# Patient Record
Sex: Male | Born: 1945
Health system: Southern US, Community
[De-identification: ages and names within clinical notes are randomized; demographics above are authoritative.]

## PROBLEM LIST (undated history)

## (undated) DIAGNOSIS — K219 Gastro-esophageal reflux disease without esophagitis: Secondary | ICD-10-CM

## (undated) DIAGNOSIS — N183 Chronic kidney disease, stage 3 unspecified: Secondary | ICD-10-CM

## (undated) DIAGNOSIS — Z950 Presence of cardiac pacemaker: Secondary | ICD-10-CM

## (undated) DIAGNOSIS — E1121 Type 2 diabetes mellitus with diabetic nephropathy: Secondary | ICD-10-CM

## (undated) DIAGNOSIS — G4730 Sleep apnea, unspecified: Secondary | ICD-10-CM

## (undated) DIAGNOSIS — D649 Anemia, unspecified: Secondary | ICD-10-CM

## (undated) DIAGNOSIS — E785 Hyperlipidemia, unspecified: Secondary | ICD-10-CM

## (undated) DIAGNOSIS — R0602 Shortness of breath: Secondary | ICD-10-CM

## (undated) DIAGNOSIS — I509 Heart failure, unspecified: Secondary | ICD-10-CM

## (undated) DIAGNOSIS — M1990 Unspecified osteoarthritis, unspecified site: Secondary | ICD-10-CM

## (undated) DIAGNOSIS — Z95810 Presence of automatic (implantable) cardiac defibrillator: Secondary | ICD-10-CM

## (undated) DIAGNOSIS — I2510 Atherosclerotic heart disease of native coronary artery without angina pectoris: Secondary | ICD-10-CM

## (undated) DIAGNOSIS — I10 Essential (primary) hypertension: Secondary | ICD-10-CM

## (undated) DIAGNOSIS — I519 Heart disease, unspecified: Secondary | ICD-10-CM

## (undated) DIAGNOSIS — G709 Myoneural disorder, unspecified: Secondary | ICD-10-CM

## (undated) DIAGNOSIS — K5790 Diverticulosis of intestine, part unspecified, without perforation or abscess without bleeding: Secondary | ICD-10-CM

## (undated) HISTORY — PX: CYST EXCISION: SHX5701

## (undated) HISTORY — PX: INSERT / REPLACE / REMOVE PACEMAKER: SUR710

## (undated) HISTORY — DX: Hyperlipidemia, unspecified: E78.5

## (undated) HISTORY — DX: Heart disease, unspecified: I51.9

## (undated) HISTORY — PX: CARPAL TUNNEL RELEASE: SHX101

## (undated) HISTORY — PX: SHOULDER OPEN ROTATOR CUFF REPAIR: SHX2407

## (undated) HISTORY — DX: Type 2 diabetes mellitus with diabetic nephropathy: E11.21

## (undated) HISTORY — PX: ANTERIOR CERVICAL DECOMP/DISCECTOMY FUSION: SHX1161

## (undated) HISTORY — DX: Essential (primary) hypertension: I10

## (undated) HISTORY — DX: Atherosclerotic heart disease of native coronary artery without angina pectoris: I25.10

## (undated) HISTORY — DX: Chronic kidney disease, stage 3 unspecified: N18.30

## (undated) HISTORY — DX: Chronic kidney disease, stage 3 (moderate): N18.3

## (undated) HISTORY — DX: Diverticulosis of intestine, part unspecified, without perforation or abscess without bleeding: K57.90

## (undated) HISTORY — PX: JOINT REPLACEMENT: SHX530

## (undated) HISTORY — PX: BACK SURGERY: SHX140

---

## 2000-02-28 ENCOUNTER — Encounter: Payer: Self-pay | Admitting: Family Medicine

## 2000-02-28 ENCOUNTER — Encounter: Admission: RE | Admit: 2000-02-28 | Discharge: 2000-02-28 | Payer: Self-pay | Admitting: Family Medicine

## 2000-03-26 ENCOUNTER — Encounter: Admission: RE | Admit: 2000-03-26 | Discharge: 2000-03-26 | Payer: Self-pay | Admitting: Neurosurgery

## 2000-03-26 ENCOUNTER — Encounter: Payer: Self-pay | Admitting: Neurosurgery

## 2000-07-02 ENCOUNTER — Encounter: Payer: Self-pay | Admitting: Neurosurgery

## 2000-07-04 ENCOUNTER — Inpatient Hospital Stay (HOSPITAL_COMMUNITY): Admission: RE | Admit: 2000-07-04 | Discharge: 2000-07-08 | Payer: Self-pay | Admitting: Neurosurgery

## 2000-07-04 ENCOUNTER — Encounter: Payer: Self-pay | Admitting: Neurosurgery

## 2000-07-25 ENCOUNTER — Encounter: Payer: Self-pay | Admitting: Neurosurgery

## 2000-07-25 ENCOUNTER — Encounter: Admission: RE | Admit: 2000-07-25 | Discharge: 2000-07-25 | Payer: Self-pay | Admitting: Neurosurgery

## 2000-08-14 ENCOUNTER — Ambulatory Visit (HOSPITAL_COMMUNITY): Admission: RE | Admit: 2000-08-14 | Discharge: 2000-08-14 | Payer: Self-pay | Admitting: Neurosurgery

## 2003-01-07 ENCOUNTER — Ambulatory Visit (HOSPITAL_COMMUNITY): Admission: RE | Admit: 2003-01-07 | Discharge: 2003-01-07 | Payer: Self-pay | Admitting: Cardiology

## 2004-07-05 ENCOUNTER — Ambulatory Visit: Payer: Self-pay | Admitting: Cardiology

## 2004-07-14 ENCOUNTER — Emergency Department (HOSPITAL_COMMUNITY): Admission: EM | Admit: 2004-07-14 | Discharge: 2004-07-14 | Payer: Self-pay | Admitting: Family Medicine

## 2004-08-09 ENCOUNTER — Ambulatory Visit: Payer: Self-pay | Admitting: Cardiology

## 2004-08-10 ENCOUNTER — Ambulatory Visit: Payer: Self-pay | Admitting: Cardiology

## 2004-08-24 ENCOUNTER — Ambulatory Visit: Payer: Self-pay

## 2004-08-31 ENCOUNTER — Ambulatory Visit: Payer: Self-pay | Admitting: Internal Medicine

## 2004-09-14 ENCOUNTER — Encounter: Admission: RE | Admit: 2004-09-14 | Discharge: 2004-12-13 | Payer: Self-pay | Admitting: Internal Medicine

## 2007-03-20 ENCOUNTER — Encounter: Payer: Self-pay | Admitting: Internal Medicine

## 2007-03-20 ENCOUNTER — Ambulatory Visit: Payer: Self-pay | Admitting: Internal Medicine

## 2007-03-21 ENCOUNTER — Encounter (INDEPENDENT_AMBULATORY_CARE_PROVIDER_SITE_OTHER): Payer: Self-pay | Admitting: Internal Medicine

## 2007-03-21 ENCOUNTER — Ambulatory Visit: Payer: Self-pay | Admitting: *Deleted

## 2007-03-21 LAB — CONVERTED CEMR LAB
ALT: 18 units/L (ref 0–53)
AST: 16 units/L (ref 0–37)
Alkaline Phosphatase: 56 units/L (ref 39–117)
Chloride: 101 meq/L (ref 96–112)
Cholesterol: 286 mg/dL — ABNORMAL HIGH (ref 0–200)
Creatinine, Ser: 1.05 mg/dL (ref 0.40–1.50)
Total Bilirubin: 0.4 mg/dL (ref 0.3–1.2)
Triglycerides: 949 mg/dL — ABNORMAL HIGH (ref <150)

## 2007-04-01 ENCOUNTER — Encounter: Admission: RE | Admit: 2007-04-01 | Discharge: 2007-05-06 | Payer: Self-pay | Admitting: Family Medicine

## 2007-04-04 ENCOUNTER — Ambulatory Visit: Payer: Self-pay | Admitting: Internal Medicine

## 2007-05-16 ENCOUNTER — Ambulatory Visit: Payer: Self-pay | Admitting: Internal Medicine

## 2007-06-06 ENCOUNTER — Ambulatory Visit: Payer: Self-pay | Admitting: Internal Medicine

## 2007-06-30 ENCOUNTER — Ambulatory Visit: Payer: Self-pay | Admitting: Internal Medicine

## 2007-08-12 ENCOUNTER — Ambulatory Visit: Payer: Self-pay | Admitting: Internal Medicine

## 2007-10-22 ENCOUNTER — Ambulatory Visit: Payer: Self-pay | Admitting: Internal Medicine

## 2007-11-20 ENCOUNTER — Encounter: Admission: RE | Admit: 2007-11-20 | Discharge: 2007-11-20 | Payer: Self-pay | Admitting: Orthopedic Surgery

## 2007-12-29 ENCOUNTER — Ambulatory Visit (HOSPITAL_COMMUNITY): Admission: RE | Admit: 2007-12-29 | Discharge: 2007-12-29 | Payer: Self-pay | Admitting: Orthopaedic Surgery

## 2008-01-16 ENCOUNTER — Ambulatory Visit (HOSPITAL_COMMUNITY): Admission: RE | Admit: 2008-01-16 | Discharge: 2008-01-17 | Payer: Self-pay | Admitting: Orthopaedic Surgery

## 2008-01-16 ENCOUNTER — Ambulatory Visit: Payer: Self-pay | Admitting: Internal Medicine

## 2008-01-28 ENCOUNTER — Ambulatory Visit: Payer: Self-pay | Admitting: Internal Medicine

## 2008-02-03 ENCOUNTER — Ambulatory Visit: Payer: Self-pay

## 2008-02-03 ENCOUNTER — Encounter: Payer: Self-pay | Admitting: Cardiology

## 2008-02-03 LAB — CONVERTED CEMR LAB
AST: 21 units/L (ref 0–37)
Albumin: 4.2 g/dL (ref 3.5–5.2)
Alkaline Phosphatase: 45 units/L (ref 39–117)
Cholesterol: 181 mg/dL (ref 0–200)
Total Bilirubin: 0.8 mg/dL (ref 0.3–1.2)
Total CHOL/HDL Ratio: 5.9
Total Protein: 7.4 g/dL (ref 6.0–8.3)
Triglycerides: 229 mg/dL (ref 0–149)
VLDL: 46 mg/dL — ABNORMAL HIGH (ref 0–40)

## 2008-02-09 ENCOUNTER — Ambulatory Visit: Payer: Self-pay

## 2008-02-09 ENCOUNTER — Encounter: Payer: Self-pay | Admitting: Internal Medicine

## 2008-02-12 ENCOUNTER — Encounter: Admission: RE | Admit: 2008-02-12 | Discharge: 2008-04-19 | Payer: Self-pay | Admitting: Orthopaedic Surgery

## 2008-02-24 ENCOUNTER — Ambulatory Visit: Payer: Self-pay | Admitting: Pulmonary Disease

## 2008-02-24 DIAGNOSIS — I10 Essential (primary) hypertension: Secondary | ICD-10-CM | POA: Insufficient documentation

## 2008-02-24 DIAGNOSIS — E785 Hyperlipidemia, unspecified: Secondary | ICD-10-CM | POA: Insufficient documentation

## 2008-02-24 DIAGNOSIS — I214 Non-ST elevation (NSTEMI) myocardial infarction: Secondary | ICD-10-CM

## 2008-02-24 DIAGNOSIS — G4710 Hypersomnia, unspecified: Secondary | ICD-10-CM | POA: Insufficient documentation

## 2008-03-18 ENCOUNTER — Ambulatory Visit (HOSPITAL_BASED_OUTPATIENT_CLINIC_OR_DEPARTMENT_OTHER): Admission: RE | Admit: 2008-03-18 | Discharge: 2008-03-18 | Payer: Self-pay | Admitting: Pulmonary Disease

## 2008-03-18 ENCOUNTER — Encounter: Payer: Self-pay | Admitting: Pulmonary Disease

## 2008-03-22 ENCOUNTER — Ambulatory Visit: Payer: Self-pay | Admitting: Internal Medicine

## 2008-03-25 ENCOUNTER — Ambulatory Visit: Payer: Self-pay | Admitting: Pulmonary Disease

## 2008-04-09 ENCOUNTER — Ambulatory Visit (HOSPITAL_COMMUNITY): Admission: RE | Admit: 2008-04-09 | Discharge: 2008-04-10 | Payer: Self-pay | Admitting: Orthopaedic Surgery

## 2008-04-09 ENCOUNTER — Ambulatory Visit: Payer: Self-pay | Admitting: Cardiovascular Disease

## 2008-04-19 ENCOUNTER — Encounter: Payer: Self-pay | Admitting: Internal Medicine

## 2008-04-19 ENCOUNTER — Ambulatory Visit: Payer: Self-pay

## 2008-04-30 ENCOUNTER — Ambulatory Visit: Payer: Self-pay | Admitting: Family Medicine

## 2008-05-03 ENCOUNTER — Ambulatory Visit: Payer: Self-pay | Admitting: Internal Medicine

## 2008-05-03 LAB — CONVERTED CEMR LAB
BUN: 17 mg/dL (ref 6–23)
Bilirubin, Direct: 0.1 mg/dL (ref 0.0–0.3)
Calcium: 9.1 mg/dL (ref 8.4–10.5)
Creatinine, Ser: 1.2 mg/dL (ref 0.4–1.5)
HDL: 27.3 mg/dL — ABNORMAL LOW (ref >39.0)
Potassium: 4.7 meq/L (ref 3.5–5.1)
Sodium: 140 meq/L (ref 135–145)
Total Protein: 6.9 g/dL (ref 6.0–8.3)
Triglycerides: 252 mg/dL (ref 0–149)
VLDL: 50 mg/dL — ABNORMAL HIGH (ref 0–40)

## 2008-05-06 ENCOUNTER — Ambulatory Visit: Payer: Self-pay | Admitting: Internal Medicine

## 2008-05-11 ENCOUNTER — Ambulatory Visit: Payer: Self-pay | Admitting: Internal Medicine

## 2008-05-11 LAB — CONVERTED CEMR LAB
BUN: 15 mg/dL (ref 6–23)
Basophils Absolute: 0.1 10*3/uL (ref 0.0–0.1)
Basophils Relative: 1 % (ref 0.0–3.0)
CO2: 29 meq/L (ref 19–32)
Eosinophils Relative: 3.6 % (ref 0.0–5.0)
GFR calc non Af Amer: 80 mL/min
Glucose, Bld: 80 mg/dL (ref 70–99)
HCT: 39.3 % (ref 39.0–52.0)
INR: 1.1 — ABNORMAL HIGH (ref 0.8–1.0)
MCV: 88 fL (ref 78.0–100.0)
Monocytes Absolute: 0.7 10*3/uL (ref 0.1–1.0)
Monocytes Relative: 9.8 % (ref 3.0–12.0)
Neutro Abs: 4.7 10*3/uL (ref 1.4–7.7)
Platelets: 205 10*3/uL (ref 150–400)
Potassium: 4.4 meq/L (ref 3.5–5.1)
Sodium: 139 meq/L (ref 135–145)
WBC: 7.6 10*3/uL (ref 4.5–10.5)

## 2008-05-14 ENCOUNTER — Ambulatory Visit (HOSPITAL_COMMUNITY): Admission: RE | Admit: 2008-05-14 | Discharge: 2008-05-14 | Payer: Self-pay | Admitting: Internal Medicine

## 2008-05-14 ENCOUNTER — Ambulatory Visit: Payer: Self-pay | Admitting: Internal Medicine

## 2008-05-19 ENCOUNTER — Ambulatory Visit: Payer: Self-pay | Admitting: Internal Medicine

## 2008-05-20 ENCOUNTER — Ambulatory Visit (HOSPITAL_COMMUNITY): Admission: RE | Admit: 2008-05-20 | Discharge: 2008-05-21 | Payer: Self-pay | Admitting: Internal Medicine

## 2008-05-20 ENCOUNTER — Ambulatory Visit: Payer: Self-pay | Admitting: Internal Medicine

## 2008-06-07 ENCOUNTER — Ambulatory Visit: Payer: Self-pay

## 2008-07-06 ENCOUNTER — Ambulatory Visit: Payer: Self-pay | Admitting: Internal Medicine

## 2008-07-06 LAB — CONVERTED CEMR LAB
Bilirubin, Direct: 0.1 mg/dL (ref 0.0–0.3)
Calcium: 9.2 mg/dL (ref 8.4–10.5)
Creatinine, Ser: 1.2 mg/dL (ref 0.4–1.5)
GFR calc Af Amer: 79 mL/min
Total CHOL/HDL Ratio: 4.2
Triglycerides: 178 mg/dL — ABNORMAL HIGH (ref 0–149)
VLDL: 36 mg/dL (ref 0–40)

## 2008-07-16 ENCOUNTER — Ambulatory Visit: Payer: Self-pay

## 2008-07-22 ENCOUNTER — Observation Stay (HOSPITAL_COMMUNITY): Admission: EM | Admit: 2008-07-22 | Discharge: 2008-07-24 | Payer: Self-pay | Admitting: Emergency Medicine

## 2008-07-22 ENCOUNTER — Ambulatory Visit: Payer: Self-pay | Admitting: Internal Medicine

## 2008-08-10 ENCOUNTER — Ambulatory Visit: Payer: Self-pay | Admitting: Internal Medicine

## 2008-08-13 ENCOUNTER — Ambulatory Visit: Payer: Self-pay | Admitting: Family Medicine

## 2008-09-01 ENCOUNTER — Encounter: Payer: Self-pay | Admitting: Internal Medicine

## 2008-10-25 ENCOUNTER — Ambulatory Visit: Payer: Self-pay | Admitting: Internal Medicine

## 2008-10-25 ENCOUNTER — Encounter: Payer: Self-pay | Admitting: Family Medicine

## 2008-10-25 LAB — CONVERTED CEMR LAB
AST: 16 units/L (ref 0–37)
Alkaline Phosphatase: 39 units/L (ref 39–117)
BUN: 11 mg/dL (ref 6–23)
Basophils Absolute: 0 10*3/uL (ref 0.0–0.1)
CO2: 23 meq/L (ref 19–32)
Calcium: 9.4 mg/dL (ref 8.4–10.5)
Chloride: 106 meq/L (ref 96–112)
Cholesterol: 148 mg/dL (ref 0–200)
Creatinine, Ser: 1.13 mg/dL (ref 0.40–1.50)
Eosinophils Absolute: 0.1 10*3/uL (ref 0.0–0.7)
Eosinophils Relative: 3 % (ref 0–5)
Glucose, Bld: 122 mg/dL — ABNORMAL HIGH (ref 70–99)
Hemoglobin: 13.6 g/dL (ref 13.0–17.0)
LDL Cholesterol: 66 mg/dL (ref 0–99)
Lymphocytes Relative: 26 % (ref 12–46)
MCHC: 32 g/dL (ref 30.0–36.0)
MCV: 88 fL (ref 78.0–100.0)
Monocytes Relative: 10 % (ref 3–12)
Neutrophils Relative %: 61 % (ref 43–77)
Platelets: 168 10*3/uL (ref 150–400)
Potassium: 4.7 meq/L (ref 3.5–5.3)
Total Bilirubin: 0.5 mg/dL (ref 0.3–1.2)
Total Protein: 7.1 g/dL (ref 6.0–8.3)
Triglycerides: 239 mg/dL — ABNORMAL HIGH (ref <150)
VLDL: 48 mg/dL — ABNORMAL HIGH (ref 0–40)
WBC: 4.9 10*3/uL (ref 4.0–10.5)

## 2008-11-08 ENCOUNTER — Ambulatory Visit: Payer: Self-pay | Admitting: Internal Medicine

## 2008-11-10 ENCOUNTER — Ambulatory Visit: Payer: Self-pay | Admitting: Internal Medicine

## 2008-11-16 ENCOUNTER — Ambulatory Visit: Payer: Self-pay | Admitting: Internal Medicine

## 2008-12-23 ENCOUNTER — Encounter: Payer: Self-pay | Admitting: Internal Medicine

## 2009-02-07 ENCOUNTER — Ambulatory Visit: Payer: Self-pay | Admitting: Internal Medicine

## 2009-02-10 ENCOUNTER — Encounter: Payer: Self-pay | Admitting: Internal Medicine

## 2009-02-21 ENCOUNTER — Ambulatory Visit: Payer: Self-pay | Admitting: Internal Medicine

## 2009-02-21 ENCOUNTER — Encounter: Payer: Self-pay | Admitting: Internal Medicine

## 2009-05-10 ENCOUNTER — Ambulatory Visit: Payer: Self-pay | Admitting: Internal Medicine

## 2009-05-10 DIAGNOSIS — Z95810 Presence of automatic (implantable) cardiac defibrillator: Secondary | ICD-10-CM | POA: Insufficient documentation

## 2009-06-20 ENCOUNTER — Ambulatory Visit: Payer: Self-pay | Admitting: Internal Medicine

## 2009-08-07 ENCOUNTER — Encounter: Payer: Self-pay | Admitting: Internal Medicine

## 2009-08-08 ENCOUNTER — Ambulatory Visit: Payer: Self-pay | Admitting: Internal Medicine

## 2009-08-16 ENCOUNTER — Encounter: Payer: Self-pay | Admitting: Internal Medicine

## 2009-08-16 ENCOUNTER — Ambulatory Visit: Payer: Self-pay | Admitting: Internal Medicine

## 2009-09-27 ENCOUNTER — Ambulatory Visit: Payer: Self-pay | Admitting: Family Medicine

## 2009-09-27 LAB — CONVERTED CEMR LAB
ALT: 20 units/L (ref 0–53)
Albumin: 4.7 g/dL (ref 3.5–5.2)
CO2: 25 meq/L (ref 19–32)
PSA: 1.45 ng/mL (ref 0.10–4.00)
Sodium: 138 meq/L (ref 135–145)
Total Bilirubin: 0.6 mg/dL (ref 0.3–1.2)
Triglycerides: 249 mg/dL — ABNORMAL HIGH (ref <150)

## 2009-11-06 ENCOUNTER — Encounter: Payer: Self-pay | Admitting: Internal Medicine

## 2009-11-07 ENCOUNTER — Ambulatory Visit: Payer: Self-pay | Admitting: Internal Medicine

## 2009-11-07 ENCOUNTER — Telehealth (INDEPENDENT_AMBULATORY_CARE_PROVIDER_SITE_OTHER): Payer: Self-pay | Admitting: *Deleted

## 2009-11-16 ENCOUNTER — Encounter: Payer: Self-pay | Admitting: Internal Medicine

## 2009-12-14 ENCOUNTER — Ambulatory Visit: Payer: Self-pay | Admitting: Internal Medicine

## 2009-12-14 ENCOUNTER — Encounter (INDEPENDENT_AMBULATORY_CARE_PROVIDER_SITE_OTHER): Payer: Self-pay | Admitting: Internal Medicine

## 2009-12-21 IMAGING — CR DG CERVICAL SPINE 1V
1 series · 1 of 1 positions shown · non-contrast
Comparison: None

CLINICAL DATA: C4-5, C5-6 ACDF

CERVICAL SPINE - 1 VIEW

[view not recorded]
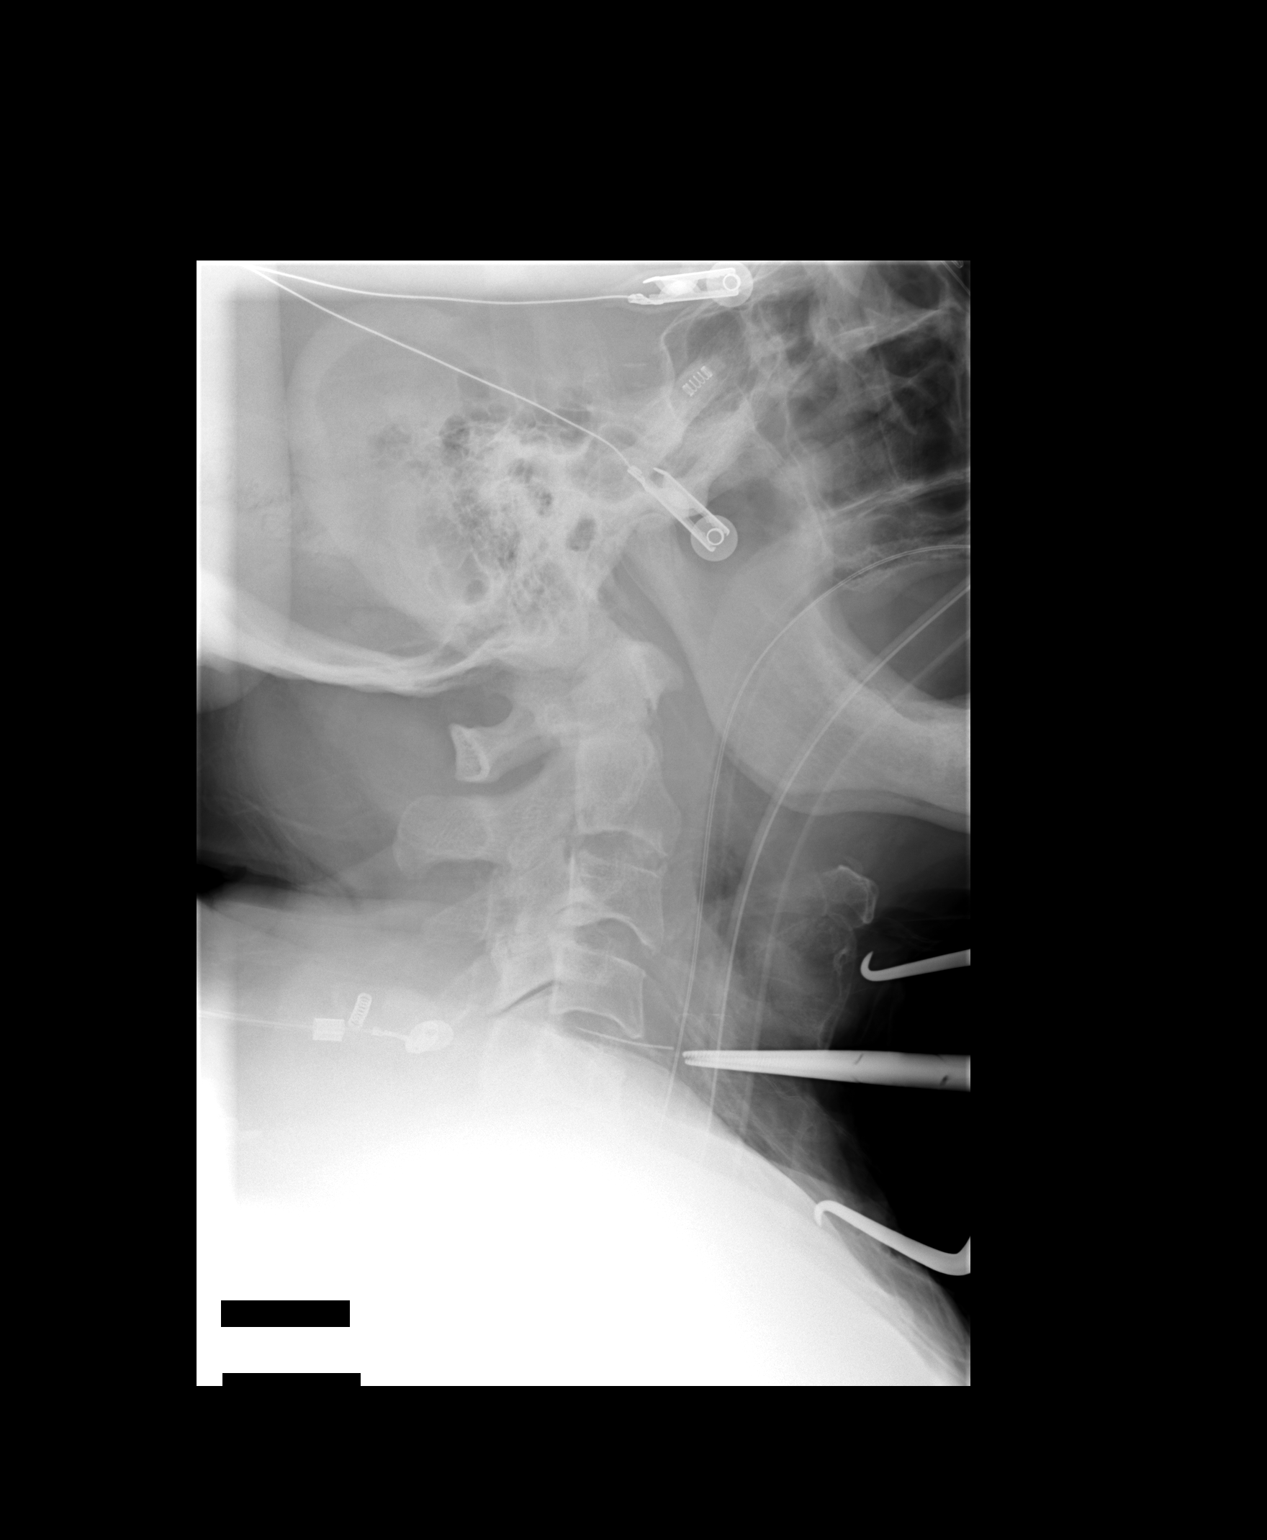

[1 of 1 positions shown; findings below may reference images not displayed]

FINDINGS: Single intraoperative image shows anterior surgical
instruments with localization at the C4-5 level.
IMPRESSION: Localization C4-5.

## 2010-01-11 ENCOUNTER — Ambulatory Visit: Payer: Self-pay | Admitting: Family Medicine

## 2010-01-11 ENCOUNTER — Encounter (INDEPENDENT_AMBULATORY_CARE_PROVIDER_SITE_OTHER): Payer: Self-pay | Admitting: *Deleted

## 2010-01-11 DIAGNOSIS — E1121 Type 2 diabetes mellitus with diabetic nephropathy: Secondary | ICD-10-CM | POA: Insufficient documentation

## 2010-01-24 ENCOUNTER — Encounter (INDEPENDENT_AMBULATORY_CARE_PROVIDER_SITE_OTHER): Payer: Self-pay | Admitting: *Deleted

## 2010-01-26 ENCOUNTER — Encounter (INDEPENDENT_AMBULATORY_CARE_PROVIDER_SITE_OTHER): Payer: Self-pay | Admitting: *Deleted

## 2010-01-26 ENCOUNTER — Ambulatory Visit: Payer: Self-pay | Admitting: Gastroenterology

## 2010-02-05 ENCOUNTER — Encounter: Payer: Self-pay | Admitting: Internal Medicine

## 2010-02-06 ENCOUNTER — Ambulatory Visit: Payer: Self-pay | Admitting: Internal Medicine

## 2010-02-06 ENCOUNTER — Ambulatory Visit: Payer: Self-pay | Admitting: Gastroenterology

## 2010-02-06 ENCOUNTER — Encounter: Payer: Self-pay | Admitting: Family Medicine

## 2010-02-10 ENCOUNTER — Telehealth: Payer: Self-pay | Admitting: Family Medicine

## 2010-02-14 ENCOUNTER — Ambulatory Visit: Payer: Self-pay | Admitting: Family Medicine

## 2010-02-14 DIAGNOSIS — E559 Vitamin D deficiency, unspecified: Secondary | ICD-10-CM | POA: Insufficient documentation

## 2010-02-14 LAB — CONVERTED CEMR LAB: Vit D, 25-Hydroxy: 45 ng/mL (ref 30–89)

## 2010-02-15 LAB — CONVERTED CEMR LAB
Albumin: 4.4 g/dL (ref 3.5–5.2)
Alkaline Phosphatase: 32 units/L — ABNORMAL LOW (ref 39–117)
BUN: 36 mg/dL — ABNORMAL HIGH (ref 6–23)
Calcium: 9.1 mg/dL (ref 8.4–10.5)
Direct LDL: 79.6 mg/dL
Glucose, Bld: 89 mg/dL (ref 70–99)
HDL: 31.7 mg/dL — ABNORMAL LOW (ref >39.00)
Potassium: 5 meq/L (ref 3.5–5.1)
Sodium: 139 meq/L (ref 135–145)
Total Bilirubin: 0.5 mg/dL (ref 0.3–1.2)
Total CHOL/HDL Ratio: 5
Total Protein: 7 g/dL (ref 6.0–8.3)
Triglycerides: 287 mg/dL — ABNORMAL HIGH (ref 0.0–149.0)
VLDL: 57.4 mg/dL — ABNORMAL HIGH (ref 0.0–40.0)

## 2010-02-16 ENCOUNTER — Encounter: Payer: Self-pay | Admitting: Internal Medicine

## 2010-02-20 ENCOUNTER — Ambulatory Visit: Payer: Self-pay | Admitting: Family Medicine

## 2010-02-20 DIAGNOSIS — N529 Male erectile dysfunction, unspecified: Secondary | ICD-10-CM

## 2010-02-21 LAB — CONVERTED CEMR LAB: Hgb A1c MFr Bld: 6.4 % (ref 4.6–6.5)

## 2010-05-09 ENCOUNTER — Ambulatory Visit: Payer: Self-pay | Admitting: Internal Medicine

## 2010-05-18 ENCOUNTER — Ambulatory Visit: Payer: Self-pay | Admitting: Family Medicine

## 2010-05-19 ENCOUNTER — Encounter: Payer: Self-pay | Admitting: Family Medicine

## 2010-05-19 LAB — CONVERTED CEMR LAB
ALT: 17 units/L (ref 0–53)
AST: 23 units/L (ref 0–37)
Albumin: 4 g/dL (ref 3.5–5.2)
Bilirubin, Direct: 0 mg/dL (ref 0.0–0.3)
HDL: 35 mg/dL — ABNORMAL LOW (ref >39.00)
Hgb A1c MFr Bld: 6.3 % (ref 4.6–6.5)
Total Bilirubin: 0.6 mg/dL (ref 0.3–1.2)
Triglycerides: 171 mg/dL — ABNORMAL HIGH (ref 0.0–149.0)

## 2010-08-10 ENCOUNTER — Ambulatory Visit
Admission: RE | Admit: 2010-08-10 | Discharge: 2010-08-10 | Payer: Self-pay | Source: Home / Self Care | Attending: Internal Medicine | Admitting: Internal Medicine

## 2010-08-10 ENCOUNTER — Encounter: Payer: Self-pay | Admitting: Internal Medicine

## 2010-08-27 ENCOUNTER — Encounter (INDEPENDENT_AMBULATORY_CARE_PROVIDER_SITE_OTHER): Payer: Self-pay | Admitting: *Deleted

## 2010-08-29 NOTE — Letter (Signed)
Summary: Generic Letter  [place] at [facility]  [address] [place], [place] [address]   Phone: [phone]  Fax: [phone]    05/19/2010  [name] [address] [place], [place]  [address]  Dear [name],   We have received your lab results and Dr. Dayton Martes says that your labs look good.  A1C good. Bad cholesterol, (LDL) looks great!  Good cholesterol, (HDL) a little low, triglycerides a little high.  Decrease added sugars, eliminate trans fats, increase fiber and limit alcohol.  Recheck in one year.           Sincerely,        Linde Gillis CMA (AAMA)for Dr. [name]

## 2010-08-29 NOTE — Assessment & Plan Note (Signed)
Summary: [facility]af      Allergies Added: NKDA  Visit Type:  Follow-up Referring Que Meneely:  Alger Memos Primary Marlow Hendrie:  [name] MD   History of Present Illness: [name] returns today for followup.  He is a 65 yo man with a h/o ICM and HTN, s/p ICD implant.  He denies c/p or sob or peripheral edema.  No intercurrent ICD therapies.  He continues to exercise and play golf without difficulty.   Current Medications (verified): 1)  Metformin Hcl 500 Mg  Tabs (Metformin Hcl) .... Take 1 Tablet By Mouth Two Times A Day 2)  Carvedilol 6.25 Mg Tabs (Carvedilol) .... Take One Tablet By Mouth Twice A Day 3)  Glipizide 5 Mg  Tabs (Glipizide) .... Take One Tablet Two Times A Day 4)  Lisinopril 20 Mg  Tabs (Lisinopril) .... Take One Tablet Daily. 5)  Lovastatin 20 Mg Tabs (Lovastatin) .... Take Four Tablets By Mouth Daily At Bedtime 6)  Adult Aspirin Ec Low Strength 81 Mg  Tbec (Aspirin) .... Take One Tablet Daily. 7)  Salmon Oil-1000 200 Mg Caps (Omega-3 Fatty Acids) .... Take One Tablet By Mouth Daily 8)  Tamsulosin Hcl 0.4 Mg Caps (Tamsulosin Hcl) .... Take One Tablet By Mouth Once Daily in The Evening 9)  Viagra 100 Mg Tabs (Sildenafil Citrate) .... Take One Tablet By Mouth Once Daily 10)  Niaspan 500 Mg Tbcr (Niacin (Antihyperlipidemic)) .Marland Kitchen.. 1 Tab At Bedtime  Allergies (verified): No Known Drug Allergies  Past History:  Past Medical History: Last updated: 01/11/2010 HTN Dyslipidemia Diabetes Mellitus CAD Systolic dysfunction, EF 45% Carpal tunnel syndrome Vitamin D deficiency  Past Surgical History: Last updated: 02/24/2008 Back surgery  Left carpal tunnel release Left rotator cuff surgery  Review of Systems  The patient denies chest pain, syncope, dyspnea on exertion, and peripheral edema.    Vital Signs:  Patient profile:   65 year old male Height:      69 inches Weight:      308 pounds BMI:     45.65 Pulse rate:   64 / minute BP sitting:   120 /  74  (left arm)  Vitals Entered By: Laurance Flatten CMA (May 09, 2010 2:22 PM)  Physical Exam  General:  Well developed, well nourished, in no acute distress.  HEENT: normal Neck: supple. No JVD. Carotids 2+ bilaterally no bruits Cor: RRR no rubs, gallops or murmur Lungs: CTA. Well healed ICD incision. Ab: soft, nontender. nondistended. No HSM. Good bowel sounds Ext: warm. no cyanosis, clubbing or edema Neuro: alert and oriented. Grossly nonfocal. affect pleasant     ICD Specifications Following MD:  Lewayne Bunting, MD     ICD Vendor:  [employer] Jude     ICD Model Number:  [id number]     ICD Serial Number:  [id number] ICD DOI:  05/20/2008     ICD Implanting MD:  Sherryl Manges, MD  Lead 1:    Location: RV     DOI: 05/20/2008     Model #: [id number]     Serial #: [id number]     Status: active  Indications::  ICM   ICD Follow Up Battery Voltage:  >3.20 V     Charge Time:  10.6 seconds     Battery Est. Longevity:  6.6-7.4 yrs Underlying rhythm:  SR ICD Dependent:  No       ICD Device Measurements Right Ventricle:  Amplitude: 6.9 mV, Impedance: 390 ohms, Threshold: 1.5 V at 0.8 msec Shock Impedance: 68  ohms   Episodes MS Episodes:  0     Shock:  0     ATP:  0     Nonsustained:  0     Atrial Therapies:  0 Ventricular Pacing:  <1%  Brady Parameters Mode VVI     Lower Rate Limit:  40      Tachy Zones VF:  240     VT:  200     VT1:  176     Next Remote Date:  08/10/2010     Next Cardiology Appt Due:  04/30/2011 Tech Comments:  NORMAL DEVICE FUNCTION.  NO EPISODES SINCE LAST CHECK.  CHANGED RV PULSE WIDTH FROM 0.5 TO 0.74ms.  MERLIN CHECK 08-10-10. ROV IN 12 MTHS W/GT. Vella Kohler  May 09, 2010 2:32 PM MD Comments:  Agree with above.  Impression & Recommendations:  Problem # 1:  AUTOMATIC IMPLANTABLE CARDIAC DEFIBRILLATOR SITU (ICD-V45.02) His device is working normally.  Will recheck in several months.  Problem # 2:  HYPERTENSION (ICD-401.9) He will continue his meds as below and a low  sodium diet. His updated medication list for this problem includes:    Carvedilol 6.25 Mg Tabs (Carvedilol) .Marland Kitchen... Take one tablet by mouth twice a day    Lisinopril 20 Mg Tabs (Lisinopril) .Marland Kitchen... Take one tablet daily.    Adult Aspirin Ec Low Strength 81 Mg Tbec (Aspirin) .Marland Kitchen... Take one tablet daily.  Patient Instructions: 1)  Your physician wants you to follow-up in: 12 months with Dr Court Joy will receive a reminder letter in the mail two months in advance. If you don't receive a letter, please call our office to schedule the follow-up appointment. 2)  merlin transmission 08/10/10

## 2010-08-29 NOTE — Assessment & Plan Note (Signed)
Summary: 6 WK F/U DLO   Vital Signs:  Patient profile:   65 year old male Height:      69 inches Weight:      207.38 pounds BMI:     30.74 Temp:     98.3 degrees F oral Pulse rate:   68 / minute Pulse rhythm:   regular BP sitting:   102 / 62  (left arm) Cuff size:   regular  Vitals Entered By: Linde Gillis CMA Duncan Dull) (February 20, 2010 8:44 AM) CC: follow up lab work   History of Present Illness: 65 yo here to follow up labs.  Cholesterol panel last week:  LDL 79, TG 287, HDL 31. On Lovastatin 80 mg at bedtime ([facility] at [facility] years ago). Denies any muscle aches.  Fatigue- has been increasingly more fatigued and feels that his ED is getting worse. Denies any CP, SOB, DOE, LE edema. Takes Viagra but feels that there is something else going on, wants his testosterone level checked. BPH symptoms well controlled on FLomax.  Current Medications (verified): 1)  Metformin Hcl 500 Mg  Tabs (Metformin Hcl) .... Take 1 Tablet By Mouth Two Times A Day 2)  Glipizide 5 Mg  Tabs (Glipizide) .... Take One Tablet Two Times A Day 3)  Lisinopril 20 Mg  Tabs (Lisinopril) .... Take One Tablet Daily. 4)  Carvedilol 6.25 Mg Tabs (Carvedilol) .... Take One Tablet By Mouth Twice A Day 5)  Lovastatin 20 Mg Tabs (Lovastatin) .... Take Four Tablets By Mouth Daily At Bedtime 6)  Adult Aspirin Ec Low Strength 81 Mg  Tbec (Aspirin) .... Take One Tablet Daily. 7)  Salmon Oil-1000 200 Mg Caps (Omega-3 Fatty Acids) .... Take One Tablet By Mouth Daily 8)  Tamsulosin Hcl 0.4 Mg Caps (Tamsulosin Hcl) .... Take One Tablet By Mouth Once Daily in The Evening 9)  Vitamin D (Ergocalciferol) 50000 Unit Caps (Ergocalciferol) .... Take One Tablet By Mouth Once A Week For Eight Weeks 10)  Viagra 100 Mg Tabs (Sildenafil Citrate) .... Take One Tablet By Mouth Once Daily 11)  Moviprep 100 Gm  Solr (Peg-Kcl-Nacl-Nasulf-Na Asc-C) .... As Per Prep Instructions. 12)  Niaspan 500 Mg Tbcr (Niacin (Antihyperlipidemic)) .Marland Kitchen..  1 Tab At Bedtime  Allergies (verified): No Known Drug Allergies  Past History:  Past Medical History: Last updated: 01/11/2010 HTN Dyslipidemia Diabetes Mellitus CAD Systolic dysfunction, EF 45% Carpal tunnel syndrome Vitamin D deficiency  Past Surgical History: Last updated: 02/24/2008 Back surgery  Left carpal tunnel release Left rotator cuff surgery  Family History: Last updated: 02/24/2008 Father - MI 34 Mother - Old age 84  Social History: Last updated: 01/11/2010 Married.  Retired, Medical sales representative.  Quit smoking in 1986. Lives in [place]. Two adult children live near by.  Risk Factors: Smoking Status: quit (02/24/2008)  Review of Systems      See HPI General:  Complains of fatigue; denies fever, loss of appetite, malaise, weakness, and weight loss. CV:  Denies chest pain or discomfort, shortness of breath with exertion, swelling of feet, and swelling of hands.  Physical Exam  General:  alert, well-developed, and well-nourished.   Psych:  Cognition and judgment appear intact. Alert and cooperative with normal attention span and concentration. No apparent delusions, illusions, hallucinations   Impression & Recommendations:  Problem # 1:  HYPERLIPIDEMIA (ICD-272.4) Assessment Unchanged Time spent with patient 25 minutes, more than 50% of this time was spent counseling patient on hyperlipidema. Started Niaspan 500 mg at bedtime and scheduled lab visit for  follow up in October. Discussed if he does develop myalgias, we need to decrease his Lovastatin dose. Pt in agreement with plan.  His updated medication list for this problem includes:    Lovastatin 20 Mg Tabs (Lovastatin) .Marland Kitchen... Take four tablets by mouth daily at bedtime    Niaspan 500 Mg Tbcr (Niacin (antihyperlipidemic)) .Marland Kitchen... 1 tab at bedtime  Problem # 2:  DM (ICD-250.00) Assessment: Unchanged Has been doing well.  Random CBG last week 111. His updated medication list for this problem  includes:    Metformin Hcl 500 Mg Tabs (Metformin hcl) .Marland Kitchen... Take 1 tablet by mouth two times a day    Glipizide 5 Mg Tabs (Glipizide) .Marland Kitchen... Take one tablet two times a day    Lisinopril 20 Mg Tabs (Lisinopril) .Marland Kitchen... Take one tablet daily.    Adult Aspirin Ec Low Strength 81 Mg Tbec (Aspirin) .Marland Kitchen... Take one tablet daily.  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  Will check testosterone level today. His updated medication list for this problem includes:    Viagra 100 Mg Tabs (Sildenafil citrate) .Marland Kitchen... Take one tablet by mouth once daily  Orders: Venipuncture (53664) TLB-Testosterone, Total (84403-TESTO)  Complete Medication List: 1)  Metformin Hcl 500 Mg Tabs (Metformin hcl) .... Take 1 tablet by mouth two times a day 2)  Glipizide 5 Mg Tabs (Glipizide) .... Take one tablet two times a day 3)  Lisinopril 20 Mg Tabs (Lisinopril) .... Take one tablet daily. 4)  Carvedilol 6.25 Mg Tabs (Carvedilol) .... Take one tablet by mouth twice a day 5)  Lovastatin 20 Mg Tabs (Lovastatin) .... Take four tablets by mouth daily at bedtime 6)  Adult Aspirin Ec Low Strength 81 Mg Tbec (Aspirin) .... Take one tablet daily. 7)  Salmon Oil-1000 200 Mg Caps (Omega-3 fatty acids) .... Take one tablet by mouth daily 8)  Tamsulosin Hcl 0.4 Mg Caps (Tamsulosin hcl) .... Take one tablet by mouth once daily in the evening 9)  Vitamin D (ergocalciferol) 50000 Unit Caps (Ergocalciferol) .... Take one tablet by mouth once a week for eight weeks 10)  Viagra 100 Mg Tabs (Sildenafil citrate) .... Take one tablet by mouth once daily 11)  Moviprep 100 Gm Solr (Peg-kcl-nacl-nasulf-na asc-c) .... As per prep instructions. 12)  Niaspan 500 Mg Tbcr (Niacin (antihyperlipidemic)) .Marland Kitchen.. 1 tab at bedtime  Patient Instructions: 1)  Your lab appointment in scheduled for 05/18/10 at 8  am. 2)  Great to see you!  Current Allergies (reviewed today): No known allergies

## 2010-08-29 NOTE — Cardiovascular Report (Signed)
Summary: Office Visit Remote   Office Visit Remote   Imported By: Roderic Ovens 02/16/2010 16:08:03  _____________________________________________________________________  External Attachment:    Type:   Image     Comment:   External Document

## 2010-08-29 NOTE — Letter (Signed)
Summary: Remote Device Check  [facility], Main Office  [address]   [place], [place] [address]   Phone: [phone]  Fax: [phone]     November 16, 2009 MRN: [id number]   Randy [name] [address] [place], [place]  [address]   Dear Mr. Fooks,   Your remote transmission was recieved and reviewed by your physician.  All diagnostics were within normal limits for you.  __X___Your next transmission is scheduled for:  February 06, 2010.  Please transmit at any time this day.  If you have a wireless device your transmission will be sent automatically.  ______Your next office visit is scheduled for:                              . Please call our office to schedule an appointment.    Sincerely,  Proofreader

## 2010-08-29 NOTE — Assessment & Plan Note (Signed)
Summary: NEW PT TO EST/CLE   Vital Signs:  Patient profile:   65 year old male Height:      69 inches Weight:      208.25 pounds BMI:     30.86 Temp:     97.9 degrees F oral Pulse rate:   60 / minute Pulse rhythm:   regular BP sitting:   102 / 70  (left arm) Cuff size:   regular  Vitals Entered By: Linde Gillis CMA Duncan Dull) (January 11, 2010 1:45 PM) CC: new patient   History of Present Illness: 65 yo here to establish care.  1.  ICM, CHF s/p ICD placement in 2009.  Followed by Corinda [facility]ler cards.  Doing well, device has been working properly.    2.  DM- Metformin 500 mg two times a day and Glypizide 5 mg two times a day.  Checks his CBGs once daily, usually fasting.  Always below 120.  Denies any episodes of hypo or hyperglycemia.  3.  Vit D deficiency- had labs checked at [facility] in March 2011.  Vit D was 16.  Discussed with pt today, he has only taken 2 out of his 8 weekly doses of Vit D 50,000 units.  Does endorse more fatigue and muscle aches lately.  4.  HTN- Has been well controlled.  On Lisinopril 20 mg daily, Carvediolol 6. 25 mg two times a day. Denies any CP, SOB, LE edema, blurred vision.  5.  HLD- reviewed labs from [facility] dated 09/27/2009- LDL 88, HDL 33, TG 249. On Lovastatin 80 mg daily, Fish oil.  Well man- due for colonscopy, zostavax.    Current Medications (verified): 1)  Metformin Hcl 500 Mg  Tabs (Metformin Hcl) .... Take 1 Tablet By Mouth Two Times A Day 2)  Glipizide 5 Mg  Tabs (Glipizide) .... Take One Tablet Two Times A Day 3)  Lisinopril 20 Mg  Tabs (Lisinopril) .... Take One Tablet Daily. 4)  Carvedilol 6.25 Mg Tabs (Carvedilol) .... Take One Tablet By Mouth Twice A Day 5)  Lovastatin 20 Mg Tabs (Lovastatin) .... Take Four Tablets By Mouth Daily At Bedtime 6)  Adult Aspirin Ec Low Strength 81 Mg  Tbec (Aspirin) .... Take One Tablet Daily. 7)  Salmon Oil-1000 200 Mg Caps (Omega-3 Fatty Acids) .... Take One Tablet By Mouth Daily 8)  Tamsulosin Hcl  0.4 Mg Caps (Tamsulosin Hcl) .... Take One Tablet By Mouth Once Daily in The Evening 9)  Vitamin D (Ergocalciferol) 50000 Unit Caps (Ergocalciferol) .... Take One Tablet By Mouth Once A Week For Eight Weeks 10)  Viagra 100 Mg Tabs (Sildenafil Citrate) .... Take One Tablet By Mouth Once Daily  Allergies (verified): No Known Drug Allergies  Past History:  Past Surgical History: Last updated: 02/24/2008 Back surgery  Left carpal tunnel release Left rotator cuff surgery  Family History: Last updated: 02/24/2008 Father - MI 15 Mother - Old age 66  Social History: Last updated: 01/11/2010 Married.  Retired, Medical sales representative.  Quit smoking in 1986. Lives in [place]. Two adult children live near by.  Risk Factors: Smoking Status: quit (02/24/2008)  Past Medical History: HTN Dyslipidemia Diabetes Mellitus CAD Systolic dysfunction, EF 45% Carpal tunnel syndrome Vitamin D deficiency  Family History: Reviewed history from 02/24/2008 and no changes required. Father - MI 89 Mother - Old age 44  Social History: Reviewed history from 02/24/2008 and no changes required. Married.  Retired, Medical sales representative.  Quit smoking in 1986. Lives in [place]. Two adult children live near  by.  Review of Systems      See HPI General:  Complains of fatigue; denies weakness. Eyes:  Denies blurring. ENT:  Denies difficulty swallowing. CV:  Denies chest pain or discomfort and difficulty breathing at night. Resp:  Denies shortness of breath. GI:  Denies abdominal pain, bloody stools, and change in bowel habits. GU:  Complains of nocturia; denies dysuria. MS:  Denies joint pain, joint redness, and joint swelling. Derm:  Denies rash. Neuro:  Denies headaches. Psych:  Denies anxiety and depression. Endo:  Denies excessive thirst and excessive urination. Heme:  Denies abnormal bruising and bleeding.  Physical Exam  General:  alert, well-developed, and well-nourished.   Head:   normocephalic and atraumatic.   Eyes:  vision grossly intact, pupils equal, pupils round, and pupils reactive to light.   Ears:  R ear normal and L ear normal.   Nose:  no external deformity.   Mouth:  good dentition.   Neck:  no LAN, no Thyromegaly, no JVD Lungs:  no wheezing or rales Heart:  regular rhythm, normal rate, and no murmurs.   Abdomen:  obese, soft, nontender Msk:  No deformity or scoliosis noted of thoracic or lumbar spine.   Extremities:  no edema, cyanosis, or clubbing. Neurologic:  alert & oriented X3 and gait normal.   Skin:  Intact without suspicious lesions or rashes Psych:  Cognition and judgment appear intact. Alert and cooperative with normal attention span and concentration. No apparent delusions, illusions, hallucinations   Impression & Recommendations:  Problem # 1:  VITAMIN D DEFICIENCY (ICD-268.9) Assessment New Since he has only taken 2 out of 8 doses, will schedule lab visit in 6 weeks to recheck his Vit D.  Will likely need long term supplementation.  Problem # 2:  HYPERLIPIDEMIA (ICD-272.4) Assessment: Deteriorated Discussed my concerns with pt.  HDL is low and TG high. Since cholesterol was just checked last month, will await to recheck when he returns in 6 weeks. Given lifestyle changes he can make to improve his TG.  Will also recheck an a1c to make sure his glucose is not elevated.  See pt instructions for details.  His updated medication list for this problem includes:    Lovastatin 20 Mg Tabs (Lovastatin) .Marland Kitchen... Take four tablets by mouth daily at bedtime  Problem # 3:  HYPERTENSION (ICD-401.9) Assessment: Unchanged Stable, continue current meds. His updated medication list for this problem includes:    Lisinopril 20 Mg Tabs (Lisinopril) .Marland Kitchen... Take one tablet daily.    Carvedilol 6.25 Mg Tabs (Carvedilol) .Marland Kitchen... Take one tablet by mouth twice a day  Problem # 4:  DM (ICD-250.00) Assessment: Unchanged Appears well controlled.  Continue  current meds, will check a1c in 6 weeks. His updated medication list for this problem includes:    Metformin Hcl 500 Mg Tabs (Metformin hcl) .Marland Kitchen... Take 1 tablet by mouth two times a day    Glipizide 5 Mg Tabs (Glipizide) .Marland Kitchen... Take one tablet two times a day    Lisinopril 20 Mg Tabs (Lisinopril) .Marland Kitchen... Take one tablet daily.    Adult Aspirin Ec Low Strength 81 Mg Tbec (Aspirin) .Marland Kitchen... Take one tablet daily.  Problem # 5:  AUTOMATIC IMPLANTABLE CARDIAC DEFIBRILLATOR SITU (ICD-V45.02) Assessment: Unchanged continue to follow along with cards.  Problem # 6:  Preventive Health Care (ICD-V70.0) Assessment: Comment Only schedule colonoscopy today. Zostavax given today.  Complete Medication List: 1)  Metformin Hcl 500 Mg Tabs (Metformin hcl) .... Take 1 tablet by mouth two times  a day 2)  Glipizide 5 Mg Tabs (Glipizide) .... Take one tablet two times a day 3)  Lisinopril 20 Mg Tabs (Lisinopril) .... Take one tablet daily. 4)  Carvedilol 6.25 Mg Tabs (Carvedilol) .... Take one tablet by mouth twice a day 5)  Lovastatin 20 Mg Tabs (Lovastatin) .... Take four tablets by mouth daily at bedtime 6)  Adult Aspirin Ec Low Strength 81 Mg Tbec (Aspirin) .... Take one tablet daily. 7)  Salmon Oil-1000 200 Mg Caps (Omega-3 fatty acids) .... Take one tablet by mouth daily 8)  Tamsulosin Hcl 0.4 Mg Caps (Tamsulosin hcl) .... Take one tablet by mouth once daily in the evening 9)  Vitamin D (ergocalciferol) 50000 Unit Caps (Ergocalciferol) .... Take one tablet by mouth once a week for eight weeks 10)  Viagra 100 Mg Tabs (Sildenafil citrate) .... Take one tablet by mouth once daily  Other Orders: Gastroenterology Referral (GI)  Patient Instructions: 1)  Great to meet you, Mr .Wages. 2)  Please stop by to see [facility] on your way out to set up your colonoscopy. 3)  Make a lab appointment in 6 weeks: a1c, BMET (250.00), Vitamin D (268.9), hepatic panel, fasting lipid panel (272.4). 4)  Triglycerides are too  high.  Weight loss (even small amount) can decrease triglycerides.  Decrease added sugars, eliminate trans fats, increase fiber and limit alcohol.  All these changes together can drop triglycerides by almost 50%. Prescriptions: LISINOPRIL 20 MG  TABS (LISINOPRIL) Take one tablet daily.  #30 x 11   Entered and Authorized by:   [name] MD   Signed by:   [name] MD on 01/11/2010   Method used:   Electronically to        [facility] [phone]* (retail)       [address]       [place], [place]  [address]       Ph: [phone]       Fax: [phone]   RxID:   [id number]   Current Allergies (reviewed today): No known allergies   TD Result Date:  01/14/2008 TD Result:  historical TD Next Due:  10 yr Herpes Zoster Result Date:  01/11/2010 Herpes Zoster Result:  given   Appended Document: NEW PT TO EST/CLE     Allergies: No Known Drug Allergies   Complete Medication List: 1)  Metformin Hcl 500 Mg Tabs (Metformin hcl) .... Take 1 tablet by mouth two times a day 2)  Glipizide 5 Mg Tabs (Glipizide) .... Take one tablet two times a day 3)  Lisinopril 20 Mg Tabs (Lisinopril) .... Take one tablet daily. 4)  Carvedilol 6.25 Mg Tabs (Carvedilol) .... Take one tablet by mouth twice a day 5)  Lovastatin 20 Mg Tabs (Lovastatin) .... Take four tablets by mouth daily at bedtime 6)  Adult Aspirin Ec Low Strength 81 Mg Tbec (Aspirin) .... Take one tablet daily. 7)  Salmon Oil-1000 200 Mg Caps (Omega-3 fatty acids) .... Take one tablet by mouth daily 8)  Tamsulosin Hcl 0.4 Mg Caps (Tamsulosin hcl) .... Take one tablet by mouth once daily in the evening 9)  Vitamin D (ergocalciferol) 50000 Unit Caps (Ergocalciferol) .... Take one tablet by mouth once a week for eight weeks 10)  Viagra 100 Mg Tabs (Sildenafil citrate) .... Take one tablet by mouth once daily  Other Orders: Zoster (Shingles) Vaccine Live [phone]) Admin 1st Vaccine (44034)    Immunizations  Administered:  Zostavax # 1:    Vaccine Type: Zostavax  Site: left deltoid    Mfr: Merck    Dose: 0.65ML    Route: [place]    Given by: Linde Gillis CMA (AAMA)    Exp. Date: 02/22/2011    Lot #: [id number]    VIS given: 05/11/05 given January 11, 2010.

## 2010-08-29 NOTE — Progress Notes (Signed)
Summary: returning call   Phone Note Call from Patient Call back at Home Phone [phone]   Caller: Patient Reason for Call: Talk to Nurse Summary of Call: returning call, something about his device Initial call taken by: Migdalia Dk,  November 07, 2009 8:20 AM  Follow-up for Phone Call        Left message for patient that the call he recieved was from the teleminder for his remote transmission. Follow-up by: Altha Harm, LPN,  November 07, 2009 1:42 PM

## 2010-08-29 NOTE — Cardiovascular Report (Signed)
Summary: Office Visit   Office Visit   Imported By: Roderic Ovens 05/11/2010 15:16:03  _____________________________________________________________________  External Attachment:    Type:   Image     Comment:   External Document

## 2010-08-29 NOTE — Letter (Signed)
Summary: Diabetic Instructions  [facility]  [address]   [place], [place] [address]   Phone: [phone]  Fax: [phone]    Randy [name] [birth year] MRN: [id number]   x     ORAL DIABETIC MEDICATION INSTRUCTIONS  The day before your procedure:   Take your diabetic pill as you do normally  The day of your procedure:   Do not take your diabetic pill    We will check your blood sugar levels during the admission process and again in Recovery before discharging you home  ________________________________________________________________________

## 2010-08-29 NOTE — Miscellaneous (Signed)
Summary: [facility] previsit  Clinical Lists Changes  Medications: Added new medication of MOVIPREP 100 GM  SOLR (PEG-KCL-NACL-NASULF-NA ASC-C) As per prep instructions. - Signed Rx of MOVIPREP 100 GM  SOLR (PEG-KCL-NACL-NASULF-NA ASC-C) As per prep instructions.;  #1 x 0;  Signed;  Entered by: Karl Bales RN;  Authorized by: Mardella Layman MD [facility];  Method used: Electronically to [facility] [phone]*, [address] [place], [place]  [address], Ph: [phone], Fax: [phone]    Prescriptions: MOVIPREP 100 GM  SOLR (PEG-KCL-NACL-NASULF-NA ASC-C) As per prep instructions.  #1 x 0   Entered by:   Karl Bales RN   Authorized by:   Mardella Layman MD [facility]   Signed by:   Karl Bales RN on 01/26/2010   Method used:   Electronically to        [facility] [phone]* (retail)       [address]       [place], [place]  [address]       Ph: [phone]       Fax: [phone]   RxID:   [id number]

## 2010-08-29 NOTE — Letter (Signed)
Summary: Previsit letter  [facility]  [address] [place], [place] [address]   Phone: [phone]  Fax: [phone]       01/11/2010 MRN: [id number]  Randy [name] [address] [place], [place]  [address]  Dear Mr. Ciotti,  Welcome to the Gastroenterology Division at [facility].    You are scheduled to see a nurse for your pre-procedure visit on January 26, 2010 at 10:00 am on the 3rd floor at [facility], [address]  We ask that you try to arrive at our office 15 minutes prior to your appointment time to allow for check-in.  Your nurse visit will consist of discussing your medical and surgical history, your immediate family medical history, and your medications.    Please bring a complete list of all your medications or, if you prefer, bring the medication bottles and we will list them.  We will need to be aware of both prescribed and over the counter drugs.  We will need to know exact dosage information as well.  If you are on blood thinners (Coumadin, Plavix, Aggrenox, Ticlid, etc.) please call our office today/prior to your appointment, as we need to consult with your physician about holding your medication.   Please be prepared to read and sign documents such as consent forms, a financial agreement, and acknowledgement forms.  If necessary, and with your consent, a friend or relative is welcome to sit-in on the nurse visit with you.  Please bring your insurance card so that we may make a copy of it.  If your insurance requires a referral to see a specialist, please bring your referral form from your primary care physician.  No co-pay is required for this nurse visit.     If you cannot keep your appointment, please call [phone] to cancel or reschedule prior to your appointment date.  This allows [place] the opportunity to schedule an appointment for another patient in need of care.    Thank you for choosing [facility] for  your medical needs.  We appreciate the opportunity to care for you.  Please visit [place] at our website  to learn more about our practice.                     Sincerely.                                                                                                                   The Gastroenterology Division

## 2010-08-29 NOTE — Procedures (Signed)
Summary: Colonoscopy   Procedures Next Due Date:    Colonoscopy: 01/2020

## 2010-08-29 NOTE — Cardiovascular Report (Signed)
Summary: Office Visit Remote   Office Visit Remote   Imported By: Roderic Ovens 11/23/2009 09:42:53  _____________________________________________________________________  External Attachment:    Type:   Image     Comment:   External Document

## 2010-08-29 NOTE — Letter (Signed)
Summary: [facility] Instructions  [facility]  [address] [place], [place] [address]   Phone: [phone]  Fax: [phone]       Randy [name]    [birth year]    MRN: [id number]        Procedure Day Dorna Bloom:  [facility]  02/06/10     Arrival Time:  10:30AM     Procedure Time:  11:30AM     Location of Procedure:                    _ X_  [facility] (4th Floor)                        PREPARATION FOR COLONOSCOPY WITH MOVIPREP   Starting 5 days prior to your procedure 02/01/10 do not eat nuts, seeds, popcorn, corn, beans, peas,  salads, or any raw vegetables.  Do not take any fiber supplements (e.g. Metamucil, Citrucel, and Benefiber).  THE DAY BEFORE YOUR PROCEDURE         DATE: 02/05/10  DAY: SUNDAY  1.  Drink clear liquids the entire day-NO SOLID FOOD  2.  Do not drink anything colored red or purple.  Avoid juices with pulp.  No orange juice.  3.  Drink at least 64 oz. (8 glasses) of fluid/clear liquids during the day to prevent dehydration and help the prep work efficiently.  CLEAR LIQUIDS INCLUDE: Water Jello Ice Popsicles Tea (sugar ok, no milk/cream) Powdered fruit flavored drinks Coffee (sugar ok, no milk/cream) Gatorade Juice: apple, white grape, white cranberry  Lemonade Clear bullion, consomm, broth Carbonated beverages (any kind) Strained chicken noodle soup Hard Candy                             4.  In the morning, mix first dose of MoviPrep solution:    Empty 1 Pouch A and 1 Pouch B into the disposable container    Add lukewarm drinking water to the top line of the container. Mix to dissolve    Refrigerate (mixed solution should be used within 24 hrs)  5.  Begin drinking the prep at 5:00 p.m. The MoviPrep container is divided by 4 marks.   Every 15 minutes drink the solution down to the next mark (approximately 8 oz) until the full liter is complete.   6.  Follow completed prep with 16 oz of clear liquid of your choice (Nothing  red or purple).  Continue to drink clear liquids until bedtime.  7.  Before going to bed, mix second dose of MoviPrep solution:    Empty 1 Pouch A and 1 Pouch B into the disposable container    Add lukewarm drinking water to the top line of the container. Mix to dissolve    Refrigerate  THE DAY OF YOUR PROCEDURE      DATE: 02/06/10  DAY: MONDAY  Beginning at 6:30AM (5 hours before procedure):         1. Every 15 minutes, drink the solution down to the next mark (approx 8 oz) until the full liter is complete.  2. Follow completed prep with 16 oz. of clear liquid of your choice.    3. You may drink clear liquids until 9:30AM (2 HOURS BEFORE PROCEDURE).   MEDICATION INSTRUCTIONS  Unless otherwise instructed, you should take regular prescription medications with a small sip of water   as early as possible the morning  of your procedure.  Diabetic patients - see separate instructions.         OTHER INSTRUCTIONS  You will need a responsible adult at least 65 years of age to accompany you and drive you home.   This person must remain in the waiting room during your procedure.  Wear loose fitting clothing that is easily removed.  Leave jewelry and other valuables at home.  However, you may wish to bring a book to read or  an iPod/MP3 player to listen to music as you wait for your procedure to start.  Remove all body piercing jewelry and leave at home.  Total time from sign-in until discharge is approximately 2-3 hours.  You should go home directly after your procedure and rest.  You can resume normal activities the  day after your procedure.  The day of your procedure you should not:   Drive   Make legal decisions   Operate machinery   Drink alcohol   Return to work  You will receive specific instructions about eating, activities and medications before you leave.    The above instructions have been reviewed and explained to me by   Karl Bales RN  January 26, 2010 10:22 AM    I fully understand and can verbalize these instructions _____________________________ Date _________

## 2010-08-29 NOTE — Progress Notes (Signed)
Summary: form for diabetic supplies  Phone Note From Pharmacy   Caller: [facility] Medical Summary of Call: Form for diabetic supplies is on your desk. Initial call taken by: Lowella Petties CMA,  February 10, 2010 10:11 AM  Follow-up for Phone Call        Signed and returned to [place]. [name] MD  February 10, 2010 10:12 AM

## 2010-08-29 NOTE — Letter (Signed)
Summary: CMN for [employer]/[facility]  CMN for [facility] [employer] [facility]   Imported By: Lanelle Bal 02/09/2010 10:42:29  _____________________________________________________________________  External Attachment:    Type:   Image     Comment:   External Document

## 2010-08-29 NOTE — Letter (Signed)
Summary: Remote Device Check  [facility], Main Office  [address]   [place], [place] [address]   Phone: [phone]  Fax: [phone]     February 16, 2010 MRN: [id number]   Randy [name] [address] [place], [place]  [address]   Dear [name],   Your remote transmission was recieved and reviewed by your physician.  All diagnostics were within normal limits for you.   __X____Your next office visit is scheduled for: October with Dr Ladona Ridgel. Please call our office to schedule an appointment.    Sincerely,  Vella Kohler

## 2010-08-29 NOTE — Letter (Signed)
Summary: Remote Device Check  [facility], Main Office  [address]   [place], [place] [address]   Phone: [phone]  Fax: [phone]     August 16, 2009 MRN: [id number]   Randy [name] [address] [place], [place]  [address]   Dear [name],   Your remote transmission was recieved and reviewed by your physician.  All diagnostics were within normal limits for you.  __X___Your next transmission is scheduled for:   November 07, 2009.  Please transmit at any time this day.  If you have a wireless device your transmission will be sent automatically.      Sincerely,  Proofreader

## 2010-08-29 NOTE — Procedures (Signed)
Summary: Colonoscopy  Patient: Randy [name] Note: All result statuses are Final unless otherwise noted.  Tests: (1) Colonoscopy (COL)   COL Colonoscopy           DONE      [facility]     [address]     [place], [place]  [address]           COLONOSCOPY PROCEDURE REPORT           PATIENT:  Randy [name], Randy [name]  MR#:  [id number]     BIRTHDATE:  [birth year], 64 yrs. old  GENDER:  male     ENDOSCOPIST:  Vania Rea. Jarold Motto, MD, [facility]     REF. BY:  [name], M.D.     PROCEDURE DATE:  02/06/2010     PROCEDURE:  Average-risk screening colonoscopy     G0121     ASA CLASS:  Class III     INDICATIONS:  Colorectal cancer screening, average risk     MEDICATIONS:   Fentanyl 50 mcg IV, Versed 5 mg IV           DESCRIPTION OF PROCEDURE:   After the risks benefits and     alternatives of the procedure were thoroughly explained, informed     consent was obtained.  Digital rectal exam was performed and     revealed no abnormalities.   The LB CF-[id number] endoscope     was introduced through the anus and advanced to the cecum, which     was identified by both the appendix and ileocecal valve, limited     by a redundant colon, severe spasm; unable to pass scope.    The     quality of the prep was adequate, using MoviPrep.  The instrument     was then slowly withdrawn as the colon was fully examined.     <<PROCEDUREIMAGES>>           FINDINGS:  Mild diverticulosis was found in the sigmoid to     descending colon segments.  No polyps or cancers were seen.  This     was otherwise a normal examination of the colon.   Retroflexed     views in the rectum revealed no abnormalities.    The scope was     then withdrawn from the patient and the procedure completed.           COMPLICATIONS:  None     ENDOSCOPIC IMPRESSION:     1) Mild diverticulosis in the sigmoid to descending colon     segments     2) No polyps or cancers     3) Otherwise normal examination     RECOMMENDATIONS:     1)  high fiber diet     2) Continue current colorectal screening recommendations for     "routine risk" patients with a repeat colonoscopy in 10 years.     REPEAT EXAM:  No           ______________________________     Vania Rea. Jarold Motto, MD, Clement[facility] Graham           CC:  Marinus Maw, MD           n.     Rosalie DoctorMarland Kitchen   Vania Rea. Patterson at 02/06/2010 12:15 PM           Earney Navy, [id number]  Note: An exclamation mark (!) indicates a result that was not dispersed into the flowsheet. Document Creation Date: 02/06/2010 12:15 PM  _______________________________________________________________________  (1) Order result status: Final Collection or observation date-time: 02/06/2010 12:07 Requested date-time:  Receipt date-time:  Reported date-time:  Referring Physician:   Ordering Physician: Sheryn Bison [phone]) Specimen Source:  Source: Launa Grill Order Number: [id number] Lab site:

## 2010-08-29 NOTE — Cardiovascular Report (Signed)
Summary: Office Visit Remote   [facility]   Imported By: Roderic Ovens 08/17/2009 12:42:35  _____________________________________________________________________  External Attachment:    Type:   Image     Comment:   External Document

## 2010-09-06 NOTE — Cardiovascular Report (Signed)
Summary: Office Visit Remote   Office Visit Remote   Imported By: Roderic Ovens 08/28/2010 12:16:40  _____________________________________________________________________  External Attachment:    Type:   Image     Comment:   External Document

## 2010-09-06 NOTE — Letter (Signed)
Summary: Remote Device Check  [facility], Main Office  [address]   [place], [place] [address]   Phone: [phone]  Fax: [phone]     August 27, 2010 MRN: [id number]   Randy [name] [address] [place], [place]  [address]   Dear Mr. Goostree,   Your remote transmission was recieved and reviewed by your physician.  All diagnostics were within normal limits for you.  __X___Your next transmission is scheduled for:  11-09-2010.  Please transmit at any time this day.  If you have a wireless device your transmission will be sent automatically.   Sincerely,  Vella Kohler

## 2010-09-25 ENCOUNTER — Telehealth: Payer: Self-pay | Admitting: Family Medicine

## 2010-10-05 NOTE — Progress Notes (Signed)
Summary: flomax   Phone Note Refill Request Message from:  Fax from Pharmacy on September 25, 2010 10:31 AM  Refills Requested: Medication #1:  TAMSULOSIN HCL 0.4 MG CAPS take one tablet by mouth once daily in the evening   Last Refilled: 08/22/2010 Refill request from [facility] [address] [phone]  Initial call taken by: Melody Comas,  September 25, 2010 10:32 AM    Prescriptions: TAMSULOSIN HCL 0.4 MG CAPS (TAMSULOSIN HCL) take one tablet by mouth once daily in the evening  #30 x 6   Entered and Authorized by:   [name] MD   Signed by:   [name] MD on 09/25/2010   Method used:   Electronically to        [facility] [phone]* (retail)       [address]       [place], [place]  [address]       Ph: [phone]       Fax: [phone]   RxID:   [id number]

## 2010-10-15 LAB — GLUCOSE, CAPILLARY
Glucose-Capillary: 113 mg/dL — ABNORMAL HIGH (ref 70–99)
Glucose-Capillary: 127 mg/dL — ABNORMAL HIGH (ref 70–99)

## 2010-11-09 ENCOUNTER — Ambulatory Visit (INDEPENDENT_AMBULATORY_CARE_PROVIDER_SITE_OTHER): Payer: Medicare Other | Admitting: *Deleted

## 2010-11-09 DIAGNOSIS — I428 Other cardiomyopathies: Secondary | ICD-10-CM

## 2010-11-14 ENCOUNTER — Other Ambulatory Visit: Payer: Self-pay | Admitting: Internal Medicine

## 2010-11-14 DIAGNOSIS — I428 Other cardiomyopathies: Secondary | ICD-10-CM

## 2010-11-16 NOTE — Progress Notes (Signed)
icd remote  

## 2010-11-28 ENCOUNTER — Encounter: Payer: Self-pay | Admitting: *Deleted

## 2010-12-12 NOTE — Assessment & Plan Note (Signed)
[facility]                         ELECTROPHYSIOLOGY OFFICE NOTE   NAME:Randy [name], Randy [name]                      MRN:          [id number]  DATE:05/19/2008                            DOB:          May 13, 1946    Randy [name] returns today for followup.  He is referred today by Dr.  Gala Romney for evaluation and consideration for prophylactic ICD  insertion.  The patient is a very pleasant 65 year old male with a  history of hypertension, dyslipidemia, and prior tobacco abuse.  He has  coronary artery disease is status post out-of-hospital MI about 10 years  ago.  He had an occluded RCA at that time.  He has a EF of 30-35%.  Most  recently, his EF was 30% by echo.  The patient has class I to II heart  failure symptoms.  He denies frank syncope.  He does not have much in  the way of palpitations.  He is referred today for consideration for ICD  implantation.   His medicines include:  1. Aspirin 81 a day.  2. Metformin 1 g twice daily.  3. Lisinopril 20 a day.  4. Glipizide 5 a day.  5. Lovastatin 40 a day.  6. Coreg 6.25 twice daily.   His past medical history is notable for diabetes and dyslipidemia.  He  has hypertension.   His family history is noncontributory.   SOCIAL HISTORY:  The patient has remote tobacco use.  He denies it  currently.  He denies alcohol abuse.   His review of systems really negative except as noted in the HPI.  He  has very mild arthritis.   PHYSICAL EXAMINATION:  GENERAL:  He is a pleasant well-appearing, middle-  aged man in no distress.  VITAL SIGNS:  Blood pressure was 130/74, the pulse 70 and regular, the  respirations were 18, and the weight was 206 pounds.  HEENT:  Normocephalic and atraumatic.  Pupils equal are round.  Oropharynx moist.  Sclerae anicteric.  NECK:  No jugular venous distention.  No evidence of thyromegaly.  Trachea is midline.  The carotids are 2+ and symmetric.  LUNGS:  Clear bilaterally to  auscultation.  No wheezes, rales, or  rhonchi are present.  No increased work of breathing.  CARDIOVASCULAR:  Regular rate and rhythm.  Normal S1 and S2.  There were  no murmurs, rubs, or gallops.  The PMI was enlarged and laterally  displaced.  ABDOMEN:  Soft, nontender, nondistended.  There was no organomegaly.  Bowel sounds were present.  There was no rebound or guarding.  EXTREMITIES:  Demonstrated no cyanosis, clubbing, or edema.  The pulses  were 2+ and symmetric.  NEUROLOGIC:  Alert and oriented x3.  His cranial nerves were intact.  Strength is 5/5 and symmetric.   The EKG demonstrates sinus rhythm with nonspecific ST-T wave  abnormality.  There was poor R-wave progression throughout the entire  precordium.   IMPRESSION:  1. Ischemic cardiomyopathy with ejection fraction of 30%.  2. Congestive heart failure, class I to II.  3. Hypertension.  4. Diabetes.  5. Dyslipidemia.   DISCUSSION:  Overall, Mr.  Langenberg is stable.  I have discussed the  treatment options with the patient including risks, benefits, goals, and  expectations of prophylactic ICD insertion.  He would like to proceed  with this, and this will be scheduled the earliest possible at a  convenient time.     Doylene Canning. Ladona Ridgel, MD  Electronically Signed    GWT/MedQ  DD: 05/19/2008  DT: 05/20/2008  Job #: [id number]

## 2010-12-12 NOTE — Assessment & Plan Note (Signed)
[facility]                            CARDIOLOGY OFFICE NOTE   NAME:[name], Randy DUVA                      MRN:          [id number]  DATE:03/22/2008                            DOB:          1946-07-19    PRIMARY CARE PHYSICIAN:  Randy [name]   HISTORY:  Randy [name] is a very pleasant 65 year old male with a history  of hypertension, hyperlipidemia, previous tobacco use, and diabetes.  He  has coronary artery disease with previous out of hospital inferior MI  back in the late 90s.  He has never had a cardiac catheterization.  Cardiolite back in 2001 showed an EF of 32% with previous inferior  infarct.   I saw him for the first time in the holding area in late June prior to  his shoulder surgery.  We cleared him for his surgery without any formal  testing.  He did very well.  No cardiac complications.  We did a Myoview  on him recently showed an EF of 32% with a previous inferolateral  infarct and very mild peri-infarct ischemia.  EF of 32%.   He returns today for routine followup.  He is doing well.  He denies any  chest pain.  He does have some occasional dyspnea, but this is  unchanged.  He has not had any orthopnea, PND, or lower extremity edema.  No syncope or presyncope.   CURRENT MEDICATIONS:  1. Aspirin 81 mg a day.  2. Metformin 5000 b.i.d.  3. Lisinopril 20 a day.  4. Glipizide 2.5 b.i.d.  5. Coreg 3.125 b.i.d.  6. Lovastatin 40 nightly.   PHYSICAL EXAMINATION:  GENERAL:  He is well-appearing in no acute  distress, ambulatory in the clinic without any respiratory difficulty.  VITAL SIGNS:  Blood pressure is 146/72, heart rate 76, weight is 204  which is stable.  HEENT:  Normal.  NECK:  Supple.  No JVD.  Carotids are 2+ bilaterally without any bruits.  There is no lymphadenopathy or thyromegaly.  CARDIAC:  PMI is nondisplaced.  It is regular with S4.  No murmur.  LUNGS:  Clear.  ABDOMEN:  Soft, nontender, nondistended.   No hepatosplenomegaly.  No  bruits.  No masses.  EXTREMITIES:  Warm with no cyanosis, clubbing, or edema.  No rash.  NEURO:  Alert and oriented x3.  Cranial nerves II through XII are  intact.  Moves all 4 extremities without difficulty.  Affect is  pleasant.   ASSESSMENT AND PLAN:  1. Coronary artery disease, status post previous out of hospital      inferior myocardial infarction, this is stable.  There is no      evidence of ischemia.  We did have a long discussion about possible      cardiac catheterization to clarify his anatomy and particularly      assess his left system.  Given that he is asymptomatic, I did not      push him too hard on this in light of the COURAGE trial results.      He says he would like to think about this.  He  will need aggressive      risk factor management.  2. Left ventricular dysfunction, this is moderate.  He has not had any      symptoms of heart failure.  However, his ejection fraction does      qualify for him for defibrillator.  We have discussed this as well      and he would like to think about this.  In the meantime, we will      titrate his Coreg 6.25 b.i.d. and get an echocardiogram to further      evaluate.  He will also need titration of his ACE inhibitor.  3. Hypertension.  Blood pressure is elevated.  Continue to titrate his      Coreg and lisinopril.  4. Diabetes, this is followed by [facility].  5. Hyperlipidemia.  Goal LDL is less than 70.  We will repeat his      lipids next month.     Randy Buckles. Bensimhon, MD  Electronically Signed    DRB/MedQ  DD: 03/22/2008  DT: 03/23/2008  Job #: [id number]   cc:   Randy [name]

## 2010-12-12 NOTE — Procedures (Signed)
NAME:  Randy [name], Randy [name] NO.:  [id number]   MEDICAL RECORD NO.:  [id number]          PATIENT TYPE:  OUT   LOCATION:  SLEEP CENTER                 FACILITY:  [facility]   PHYSICIAN:  Coralyn Helling, MD        DATE OF BIRTH:  07/29/1946   DATE OF STUDY:  03/18/2008                            NOCTURNAL POLYSOMNOGRAM   REFERRING PHYSICIAN:  Coralyn Helling, MD   INDICATION FOR STUDY:  [name] is a 65 year old male who has a  history of coronary artery disease, systolic heart failure, hypertension  and diabetes.  He is referred to the sleep lab for evaluation of  hypersomnia with obstructive sleep apnea.   Height is 5 feet, 9 inches, weight is 210 pounds.  BMI is 31.  Neck size  is 17 inches.   EPWORTH SLEEPINESS SCORE:  6.   MEDICATIONS:  Glipizide, Lovastatin, aspirin, carvedilol, lisinopril and  metformin.   SLEEP ARCHITECTURE:  Total recording time is 396 minutes.  Total sleep  time is 273 minutes.  Sleep efficiency is 69%, which is reduced.  Sleep  latency is 21 minutes which is prolonged.  REM latency is 203 minutes,  which is prolonged.  The study was notable for lack of slow wave sleep.  The patient slept in both the supine and non supine positions.   RESPIRATORY DATA:  The average respiratory rate was 15.  The overall  apnea/hypopnea index was 11.  The events were exclusively obstructive in  nature.  The non supine apnea/hypopnea index was 5.  The supine  apnea/hypopnea index was 11.  The non REM apnea/hypopnea index was 8.4  The REM apnea/hypopnea index was 27.   OXYGEN DATA:  The baseline oxygenation was 98%.  The oxygen saturation  nadir was 83%.  The patient spent a total of 25 minutes with an oxygen  saturation of less than 90% and 1 minute with an oxygen saturation less  than 88%.   CARDIAC DATA:  The rhythm strip showed normal sinus rhythm with an  average heart rate of 65 and there were occasional PVC's.   MOVEMENT-PARASOMNIA:  The patient had one  restroom trip.  The periodic  limb movement index was 58.   IMPRESSIONS-RECOMMENDATIONS:  This study shows evidence for mild  obstructive sleep apnea with an apnea/hypopnea index of 11 and an oxygen  saturation nadir of 83%.  Of note is that he had a significant  REM as  well as positional effect to his sleep apnea.   He did have an increase in his periodic limb movement index and clinical  correlation will be necessary to determine the significance of this.   In addition to diet, exercise and weight reduction, positional therapy  could be tried.  However, given his history of underlying cardiovascular  disease and diabetes, additional therapeutic interventions, such as CPAP  therapy, oral appliance or surgical intervention should be considered as  well.      Coralyn Helling, MD  Diplomat, American Board of Sleep Medicine  Electronically Signed     VS/MEDQ  D:  03/25/2008 09:10:09  T:  03/25/2008 10:10:56  Job:  [id number]

## 2010-12-12 NOTE — Cardiovascular Report (Signed)
NAME:  Randy [name], Randy [name] NO.:  [id number]   MEDICAL RECORD NO.:  [id number]          PATIENT TYPE:  OIB   LOCATION:  2899                         FACILITY:  [facility]   PHYSICIAN:  Bevelyn Buckles. Bensimhon, MDDATE OF BIRTH:  28-Apr-1946   DATE OF PROCEDURE:  DATE OF DISCHARGE:  05/14/2008                            CARDIAC CATHETERIZATION   PRIMARY CARE SERVICE:  [facility].   IDENTIFICATION:  Randy [name] is a delightful 65 year old male with a  previous remote silent inferior wall myocardial infarction.  I have seen  him recently in preop, and he has undergone several surgeries without  any cardiac problems.  He did get a Myoview, which showed a dense  inferior wall scar and moderate LV dysfunction.  Recent echocardiogram  suggested that he had some worsening of his LV dysfunction and ejection  fraction in the 30% range.  He is brought for cardiac catheterization to  clearly define his coronary anatomy.  He also has been having some  significant shortness of breath and a right heart cath was planned as  well.   PROCEDURES PERFORMED:  1. Right heart catheterization.  2. Selective coronary angiography.  3. Left heart catheterization.  4. Left ventriculogram.  5. Left subclavian angiography.   DESCRIPTION OF THE PROCEDURE:  The risk and indication of the procedure  were explained, consent was signed, placed on the chart.  A 5-French  arterial sheath was placed in right femoral artery using a modified  Seldinger technique.  Standard catheters including a JL4, JR4, and  angled pigtail were used for catheterization.  All catheter exchanges  made over wire.  No apparent complications.  A 7-French venous sheath  was placed in the right femoral vein and a standard Swan-Ganz catheter  was used for right heart catheterization.  Again, no apparent  complications.   Right atrial pressure was 7, RV pressure was 27/7, PA pressure is 34/15  with a mean of 25, pulmonary capillary  wedge pressure mean of 8, central  aortic pressure 98/48 with a mean of 64, and LV pressure 103/12.   Left main was calcified.  There was a 50% distal lesion.   LAD was a long vessel, wrapping the apex, gave off 2 diagonals.  It is  heavily calcified in the proximal section.  There is a 50% proximal  lesion and 40% tubular lesion in the midsection.   Left circumflex gave off a fairly large ramus branch.  There was a 95%  complex lesion at the ostium of the left circumflex, involving the  ostium of the ramus.  It was a bit difficult to tell exactly how much of  the ramus was involved.  The remainder of the left circumflex was  heavily diseased with a 90% lesion in the midsection and 2 occluded  marginal branches.  The distal AV groove circumflex was small and very  heavily diseased.   Right coronary artery had a 95% proximal lesion.  It was then totally  occluded in the midsection.  There were bridging right to right  collaterals with faint filling of the distal right coronary bed.  Left ventriculogram done in the RAO position showed inferior akinesis  with an ejection fraction of 25%.  There is a question of an abdominal  aortic aneurysm.  On panning down over the aorta, this was not seen  well.   Left subclavian angiography showed mild plaquing of left subclavian with  no high-grade lesions.  The LIMA was patent to the chest wall.   ASSESSMENT:  1. Severe 2-vessel coronary artery disease as described above.  2. Severe left ventricular dysfunction.  3. Possible abdominal aortic aneurysm.   I have reviewed the films with Dr. Juanda Chance.  He does have severe ostial  left circumflex disease involving the ramus; however, the circumflex  does not have any graftable targets.  The ramus is a moderate to large  vessel.  The LAD just has moderate nonobstructive disease and the RCA is  chronically occluded with total akinesis of the inferior wall.  At this  point, I do not think he would  benefit significantly from surgical  revascularization.  He is likely at high risk for sudden cardiac death  and will need a referral for defibrillator.  We will also get an  abdominal ultrasound to further evaluate for abdominal aortic aneurysm.      Bevelyn Buckles. Bensimhon, MD  Electronically Signed     DRB/MEDQ  D:  05/14/2008  T:  05/14/2008  Job:  [id number]

## 2010-12-12 NOTE — Assessment & Plan Note (Signed)
[facility]                         ELECTROPHYSIOLOGY OFFICE NOTE   NAME:Randy [name], Randy [name]                      MRN:          [id number]  DATE:08/10/2008                            DOB:          1946/04/14    Randy [name] returns today for followup.  He is a very pleasant male with  a history of an ischemic cardiomyopathy and congestive heart failure.  He is status post ICD insertion back in October.  He returns today for  followup.  The patient has done well.  He denies chest pain or shortness  of breath.  He has had no significant pain at the incision site.  He had  no intercurrent IC therapies.   CURRENT MEDICATIONS:  1. Aspirin 81 a day.  2. Metformin 1 g twice daily.  3. Lisinopril 20 mg daily.  4. Glipizide 5 mg daily.  5. Fish oil supplement.  6. Coreg 6.25 1-1/2 tablet twice daily.  7. Lovastatin 80 mg daily.   PHYSICAL EXAMINATION:  GENERAL:  He is a pleasant well-appearing man in  no distress.  VITAL SIGNS:  Blood pressure 132/71, pulse 60 and regular, respirations  18, weight 208 pounds.  NECK:  Revealed no jugular venous distention.  LUNGS:  Clear bilaterally to auscultation.  No wheezes, rales, or  rhonchi are present.  No increased work of breathing.  CARDIOVASCULAR:  Regular rate and rhythm.  Normal S1 and S2.  ABDOMEN:  Soft and nontender.  There is no organomegaly.  EXTREMITIES:  Demonstrated no edema.   Interrogation of his defibrillator demonstrates a St. Jude current.  The  R-waves were 5, the impedance 360, the threshold was 1.5 volts at 0.5  milliseconds.  The battery voltage was greater than 3.2 volts.  There  are no intercurrent IC therapies.  He was less than 1% V paced.   IMPRESSION:  1. Ischemic cardiomyopathy.  2. Congestive heart failure.  3. Status post implantable cardioverter defibrillator insertion.   DISCUSSION:  Overall Randy [name] is stable and his defibrillator is  working normally.  I will plan to see  the patient back for ICD followup  in 9 months.     Doylene Canning. Ladona Ridgel, MD  Electronically Signed    GWT/MedQ  DD: 08/10/2008  DT: 08/11/2008  Job #: [id number]   cc:   Tammy R. Collins [place], M.D.

## 2010-12-12 NOTE — Op Note (Signed)
NAME:  Randy [name], Randy [name] NO.:  [id number]   MEDICAL RECORD NO.:  [id number]          PATIENT TYPE:  INP   LOCATION:  3740                         FACILITY:  [facility]   PHYSICIAN:  Doylene Canning. Ladona Ridgel, MD    DATE OF BIRTH:  1945/10/27   DATE OF PROCEDURE:  05/20/2008  DATE OF DISCHARGE:                               OPERATIVE REPORT   PROCEDURE PERFORMED:  Implantation of a single-chamber defibrillator.   INDICATIONS:  Ischemic cardiomyopathy, EF 30%, class I-II heart failure,  status post myocardial infarction.   INTRODUCTION:  The patient is a 62-year male with prior myocardial  infarction, EF 30%.  He has class I-II congestive heart failure.  He is  referred now for ICD insertion.   PROCEDURE:  After informed consent was obtained, the patient was taken  to the diagnostic EP Lab in a fasting state.  After usual preparation  and draping, intravenous fentanyl and midazolam was given for sedation.  A 30 mL of lidocaine was infiltrated into the left infraclavicular  region.  A 7-cm incision was carried out over this region.  Electrocautery was utilized to dissect down to the fascial plane.  The  left subclavian vein was then punctured x1 and the St. Jude [place],  model 7122 active fixation defibrillation lead, serial number [id number]  was advanced into the right ventricle.  Mapping was carried out at the  final site.  The R-waves measured 10 mV, which was in the RV apex.  The  impedance was 784 ohms, threshold 0.5 volts at 0.5 milliseconds.  A 10-  volt pacing did not stimulate the diaphragm.  With the ventricular lead  in satisfactory position and actively fixed, it was secured to sub-  pectoralis fascia with a figure-of-eight silk suture.  The sewing sleeve  was also secured with silk suture.  Electrocautery was utilized to make  subcutaneous pocket.  Kanamycin gauge was utilized to irrigate the  pocket and electrocautery was utilized to assure hemostasis.  The St.  Jude  current VRRF  single chamber defibrillator, serial number [id number]  was connected to the defibrillation lead and placed back in the  subcutaneous pocket.  Generator was secured with silk suture.  Defibrillation threshold testing was carried out.   After the patient was more deeply sedated with fentanyl and Versed, VF  was induced with a T-wave shock.  A 15-joule shock was delivered which  terminated VF and restored sinus rhythm.  At this point, no additional  defibrillation threshold testing was carried out and the incision was  closed with 2-0 Vicryl followed by layer of 3-0 Vicryl.  Benzoin was  painted on the skin.  Steri-Strips were applied and pressure dressing  was placed.  The patient was returned to his room in satisfactory  condition.   COMPLICATIONS:  There were no immediate procedural complications.   RESULTS:  Demonstrate successful implantation of a St. Jude single-  chamber defibrillator in a patient with ischemic cardiomyopathy and  congestive heart failure.      Doylene Canning. Ladona Ridgel, MD  Electronically Signed    GWT/MEDQ  D:  05/20/2008  T:  05/20/2008  Job:  [id number]

## 2010-12-12 NOTE — Assessment & Plan Note (Signed)
[facility]                            CARDIOLOGY OFFICE NOTE   NAME:Randy [name], Randy [name]                      MRN:          [id number]  DATE:07/06/2008                            DOB:          July 02, 1946    PRIMARY CARE PHYSICIAN:  [facility].   INTERVAL HISTORY:  Randy [name] is a delightful 65 year old male with a history  of severe coronary artery disease by catheterization several months ago,  also has congestive heart failure with an ischemic cardiomyopathy,  ejection fraction in the 30% range.  He just recently underwent  placement of an ICD by Dr. Ladona Ridgel.  Remainder of his medical history is  notable for diabetes, hypertension, and hyperlipidemia, as well as  significant orthopedic issues.   He returns today for routine followup.  He is doing very well.  He is  now walking 2 miles everyday about 40-45 minutes.  His says his exercise  capacity is markedly increased.  He however still does gets short of  breath when walking up steps.  He did not have any chest pain.  He has  been compliant with all of his medications.   CURRENT MEDICATIONS:  1. Aspirin 81.  2. Metformin 1000 b.i.d.  3. Lisinopril 20 a day.  4. Glipizide 5 a day.  5. Fish oil 1000 a day.  6. Coreg 9.375 b.i.d.  7. Lovastatin 80 a day.   PHYSICAL EXAMINATION:  GENERAL:  He is well-appearing in no acute  distress, ambulates in the clinic without respiratory difficulty.  VITAL SIGNS:  Blood pressure is 124/70, heart rate 64, weight is 200.  HEENT:  Normal.  NECK:  Supple.  No JVD.  Carotids are 2+ bilaterally without any bruits.  He has no lymphadenopathy or thyromegaly.  ICD site looks good.  CARDIAC:  His PMI is nondisplaced.  He is regular with no murmurs or  rubs.  He does have a soft S4, no S3.  LUNGS:  Clear.  ABDOMEN:  Soft, nontender, nondistended, no hepatosplenomegaly, no  bruits, no masses.  EXTREMITIES:  Warm with no cyanosis, clubbing, or edema.  No rash.  NEURO:   Alert and oriented x3.  Cranial nerves II through XII are  intact.  Moves all 4 extremities without difficulty.  Affect is  pleasant.   ASSESSMENT AND PLAN:  1. Coronary artery disease, stable.  His disease is not      revascularizable.  We will continue with medical therapy, he is      doing well.  2. Congestive heart failure secondary to ischemic cardiomyopathy.  He      is NYHA class II to III, volume status looks good.  3. Abdominal aortic aneurysm.  This was small by catheterization.  We      will get an ultrasound to size it.  4. Hypertension, well-controlled.  5. Hyperlipidemia.  We will check his lipids today.  Goal LDL is less      than 70.     Randy [name]. Bensimhon, MD  Electronically Signed    DRB/MedQ  DD: 07/06/2008  DT: 07/06/2008  Job #: [id number]

## 2010-12-12 NOTE — Assessment & Plan Note (Signed)
[facility]                            CARDIOLOGY OFFICE NOTE   NAME:Desjardin, MIKEY MAFFETT                      MRN:          [id number]  DATE:01/28/2008                            DOB:          31-Jan-1946    PRIMARY CARE PHYSICIAN:  [facility].   INTERVAL HISTORY:  Mr. Loiseau is a very pleasant 65 year old male with  a history of hypertension, hyperlipidemia, previous tobacco use, and  diabetes.  He also has presumed coronary artery disease with mild LV  dysfunction.  He had a Cardiolite back in 2001 due to an abnormal EKG.  This showed an EF of 45-50% with a small inferior and anteroseptal  infarction.  He has never had a cardiac catheterization.  I saw him in  the preop holding area last about 2 weeks ago prior to his shoulder  surgery.  There was a request for a preoperative clearance.   At that time, he denied any chest pain.  He did have some exertional  dyspnea, but this was chronic and he was relatively active, so we let  him proceed the surgery without any further workup.   He returns today for a routine followup.  He is doing quite well.  He  tolerated the surgery well.  He continues to have very mild dyspnea when  he goes up steps or does other very strenuous activity.  He denies any  chest pain; however, his wife did say that he had an episode of fairly  significant chest pain several months ago, which he did not tell us  about.   CURRENT MEDICATIONS:  1. Aspirin 81.  2. Metformin 1000 b.i.d.  3. Lisinopril 20 a day.  4. Glipizide 5 a day.  5. Crestor 10 a day.  6. TriCor 145 a day.   PHYSICAL EXAMINATION:  GENERAL:  He is well appearing, in no acute  distress, ambulatory around the clinic without any respiratory  difficulty.  VITAL SIGNS:  Blood pressure is 142/69, pulse is 83, and weight is 202.  HEENT:  Normal.  NECK:  Supple.  There is no JVD.  Carotids are 2+ bilaterally without  bruits.  There is no lymphadenopathy or  thyromegaly.  CARDIAC:  PMI is nondisplaced.  He is regular with an S4.  No murmur.  LUNGS:  Clear.  ABDOMEN:  Obese, soft, nontender, and nondistended.  There is no  hepatosplenomegaly.  No bruits.  No masses.  EXTREMITIES:  Warm with no cyanosis, clubbing, or edema.  No rash.  NEUROLOGIC:  Alert and oriented x3.  Cranial nerves II through XII are  intact.  Moves all 4 extremities without difficulty.  Affect is  pleasant.   EKG shows normal sinus rhythm with small inferior Qs at a rate of 77.  There are inferior lateral T-wave inversions which are old.   ASSESSMENT AND PLAN:  1. Presumed coronary artery disease.  Given his chest pain and      exertional dyspnea, we will proceed with a followup treadmill      Myoview to assess his exercise capacity and look for any ischemia.  Continue current medications.  2. Hypertension.  Blood pressure is elevated.  Added Coreg 3.125      b.i.d.  3. Hyperlipidemia.  He is having trouble affording the Crestor.  We      will switch him over to lovastatin 40.  Goal LDL is less than 70.      He is also having trouble affording the TriCor.  We will check his      lipids and see if he might be able to come off of this and perhaps      if needed, consider gemfibrozil.   DISPOSITION:  We will see him back in several months for a routine  followup.     Bevelyn Buckles. Bensimhon, MD  Electronically Signed    DRB/MedQ  DD: 01/28/2008  DT: 01/29/2008  Job #: [id number]   cc:   Dala Dock

## 2010-12-12 NOTE — Op Note (Signed)
NAME:  Randy [name], Randy [name]               ACCOUNT NO.:  [id number]   MEDICAL RECORD NO.:  [id number]          PATIENT TYPE:  OIB   LOCATION:  4743                         FACILITY:  [facility]   PHYSICIAN:  Mark C. Ophelia Charter, M.D.    DATE OF BIRTH:  07/23/1946   DATE OF PROCEDURE:  04/09/2008  DATE OF DISCHARGE:                               OPERATIVE REPORT   PREOPERATIVE DIAGNOSIS:  Cervical spondylosis at C4-C5 and C5-C6.   POSTOPERATIVE DIAGNOSIS:  Cervical spondylosis at C4-C5 and C5-C6.   PROCEDURE:  C4-C5 and C5-C6 anterior cervical diskectomy and fusion,  allograft and plate.   SURGEON:  Mark C. Ophelia Charter, MD   ASSISTANT:  Wende Neighbors, PA-C   ANESTHESIA:  GOT and 6 mL skin local.   ESTIMATED BLOOD LOSS:  Minimal.   DRAINS:  One Hemovac, neck.   After induction of general anesthesia, had halter traction with  ____out______ weight horseshoe head holder, standard prep with DuraPrep,  usual careful padding and positioning, arms tucked side was performed  and a yellow one-half foam pad between the shoulder blades.  The neck  was prepped with DuraPrep, scrubbed with towels, sterile skin marker  using a prominent skin fold, and Betadine Vi-drape application.  Time-  out checklist was performed after Mayo stand, thyroid sheets, and drapes  were applied.  Preoperative antibiotics had been given.  Incision was  started at the midline extending to the left.  Platysma was divided in  line with skin incision.  Blunt dissection down longus coli was  performed with needle stuck at the expected C4-C5 level, confirmed with  a cross-table lateral plain radiograph.  Diskectomy was performed as  soon as the needle was pulled out and then self-retaining retractors  were placed right and left and the appropriate level was promptly marked  by removing portion of the disk.  Diskectomy was completed with Cloward  curettes, pituitary.  Operative microscope was prepped and draped and  brought in and  continued dissection down to the posterior longitudinal  ligament.  There was a large broad-based disk protrusion with a large  amount of disk all retained by the ligament sticking out behind the  vertebral bodies at C4-C5 causing significant compression.  Pieces of  disk were teased from behind the endplate at both C4 and also C5 pulling  the fragments out until the ligament could be taken down with  microdissection techniques using the black nerve hook and sharp scalpel  and then removing all the portions of the ligament with 1-10 mm  [place].  Dura was completely decompressed, uncovertebral joints were  stripped, spurs were removed laterally at the exiting nerve root.  Trial  showed that with the wide disk space 8 mm was appropriate spacer.  A  combination of cortical cancellous graft was selected, placed and the  graft had been marked in the midline and rehydrated.  Identical  procedure was repeated at the C5-C6 level.  At this level, there was  more significant spondylosis and narrowing of the disk space was only 4-  5 mm.  Endplates were stripped, bur was used  briefly to remove spurs,  primarily hand curetting for preparation of endplate.  Microdissection  techniques with operative microscope, removing overhanging spurs  posteriorly and all the posterior longitudinal ligament, dura was well  decompressed.  Sizes showed that a 7-mm lordotic graft was appropriate.  It was implanted.  There was some mild __________ left arm as the graft  seated to the left 1-10 mm.  Spurs were removed off the C5 vertebral  body, left on the C6 and view lock Biomet EBI plate was selected, 36 mm  in length and 14-mm screws were placed in the 4 corners after  confirmation with the C-arm that was at the midline and was the  appropriate length.  All screws looked good.  Due to the patient's short  neck, distal screws had been seen with the C-arm oblique.  Instrument  count and needle count was correct.   Hemovac was placed in an in-and-out  technique using the trocar laterally in line with skin incision.  Platysma was closed with 3-0 Vicryl and 4-0 Vicryl subcuticular closure.  Tincture of Benzoin, Steri-Strips, Marcaine infiltration, 4 x 4s, and  soft cervical collar for postop dressing.  The patient was  neurologically intact in the recovery room.       Mark C. Ophelia Charter, M.D.  Electronically Signed     MCY/MEDQ  D:  04/09/2008  T:  04/10/2008  Job:  [id number]

## 2010-12-12 NOTE — Op Note (Signed)
NAME:  Randy [name], Randy [name]               ACCOUNT NO.:  [id number]   MEDICAL RECORD NO.:  [id number]          PATIENT TYPE:  OIB   LOCATION:  3729                         FACILITY:  [facility]   PHYSICIAN:  Mark C. Ophelia Charter, M.D.    DATE OF BIRTH:  [birth year]   DATE OF PROCEDURE:  DATE OF DISCHARGE:                               OPERATIVE REPORT   PREOPERATIVE DIAGNOSES:  Left shoulder near complete full-thickness  rotator cuff tear.  Old tear of long head of the biceps tendon.   POSTOPERATIVE DIAGNOSES:  Left shoulder  near complete full-thickness,  rotator cuff tear.  Old tear of long head of the biceps tendon.   PROCEDURE:  Diagnostic and operative arthroscopy left shoulder,  debridement of complex superior labral tear.  Open rotator cuff repair.   SURGEON:  Annell Greening, MD   ANESTHESIA:  GOT plus Marcaine local 30 mL.   PROCEDURE:  After induction of general anesthesia, the patient had a  cardiology preoperative clearance as he had evidence of an old septal MI  with good ejection fraction and no chest pain.  There were some mild  change on his EKG.  Cardiology discussed it with him and recommended  telemetry bed postop.  After induction of general anesthesia, standard  prep and drape on the patient in the beach-chair position was performed,  impervious stockinette, Coban, split sheets and drapes were applied.  A  time-out checklist was completed and Ancef was given preoperatively.  Skin was infiltrated posteriorly since the patient did not have a  scalene block with Marcaine 0.5% plain.  A stab incision was made.  Scope was introduced from posterior portal entering the joint.  Anterior  portal was made in the position of the usual biceps tendon and there was  no biceps tendon present.  There was complex tear of the superior labrum  with glenohumeral arthritis and debridement of the articular surface  primarily on the humeral head with rotation of the head, trimming of the  areas with areas  of grade 3, grade 4 changes.  No subluxation or  instability.  No Bankart tear was present.  Superior labrum had complex  flap tears which were trimmed with a 4.2 Gator shaver made from the  anterior approach.  After debridement, shoulder was suctioned and dried.  A small incision was made along the anterior aspect of the acromion.  Deltoid was peeled off the acromion.  The spur was removed which was  prominent and spur was removed from the distal clavicle.  Undersurface  was palpated smooth with the file.  Rotator cuff showed thinning down to  paper thickness at the supraspinatus portion and this was cut back.  Undersurface, degenerative tearing was noted.  There was cut back to  good tendon and bone was rongeured.  In the greater tuberosity, a fixed  anchor was placed.  Mattress suture was placed with closure with the  Ethilon suture and then oversewing to smooth the repair.  The anterior  and posterior portion of the cuff looked good.  Biceps tendon course was  absent and nonpalpable on the groove.  After thorough irrigation and  suctioning, the deltoid was pulled back through holes in the bone of the  acromion.  Deltoid 1-cm split was repaired loosely, 2-0 Vicryl  subcutaneous tissue, 4-0 nylon in the skin portals, 4-0 Vicryl in the  rotator cuff incision, tincture of Benzoin, Steri-Strips, additional  Marcaine infiltration total of 30 mL in the subacromial space shoulder  joint.  Postop dressing, ABDs, and sling was applied.      Mark C. Ophelia Charter, M.D.  Electronically Signed     MCY/MEDQ  D:  01/16/2008  T:  01/17/2008  Job:  [id number]

## 2010-12-12 NOTE — Discharge Summary (Signed)
NAME:  Randy [name], Randy [name] NO.:  [id number]   MEDICAL RECORD NO.:  [id number]          PATIENT TYPE:  OBV   LOCATION:  3001                         FACILITY:  [facility]   PHYSICIAN:  Ileana Roup, M.D.  DATE OF BIRTH:  08-27-1945   DATE OF ADMISSION:  07/22/2008  DATE OF DISCHARGE:  07/24/2008                               DISCHARGE SUMMARY   DISCHARGE DIAGNOSES:  1. Right hand and right index finger cellulitis, resolving.  2. Diabetes mellitus type 2 with hemoglobin A1c of 6.5.  3. Ischemic cardiomyopathy with ejection fraction of 30% based on      cardiac catheterization performed in October 2009.  4. Hypertension.  5. Hyperlipidemia.  6. Status post low back surgery (laminectomy and lumbar fusion with      instrumentation) in 2001.  7. Congestive heart failure with ejection fraction of 30%, [place]      Heart Association stage II.   DISCHARGE MEDICATIONS:  1. Augmentin 500/875 mg p.o. b.i.d. for 2 weeks.  2. Septra DS 160/800 mg p.o. t.i.d. for 2 weeks.  3. Metformin 1000 mg p.o. b.i.d.  4. Coreg 6.25 mg 1.5 tablets p.o. b.i.d.  5. Glipizide 5 mg p.o. daily.  6. Lovastatin 80 mg p.o. daily.  7. Lisinopril 20 mg p.o. daily.  8. Aspirin 81 mg p.o. daily.  9. Fish oil tablets as previously directed.   DISPOSITION AND FOLLOWUP:  The patient was discharged home in stable  condition with continued improvement of his right hand and right finger  cellulitis/infection with no signs of systemic disease present.  He is  to follow up with Dr. Mina Marble of Hand Surgery on July 27, 2008.  At  that time, the patient's right hand should be reassessed for continued  resolution of his infection and continued wound care.  Dr. Ronie Spies  phone number is [phone].  The patient is to also follow up with his  primary care physician, Dr. Ladon Applebaum at [facility] at his previously  scheduled appointment on August 13, 2008.  At that time, further  assessment of his right finger  cellulitis should be made along with  adherence to his antibiotic regimen as well.   PROCEDURES PERFORMED:  1. A plain film of the right hand showed right second digit soft      tissue swelling and probable osseous destruction highly concerning      for osteomyelitis involving the distal phalanx.  No acute fracture      or dislocation was noted.  There was mild associated soft tissue      swelling noted.  2. A plain film of the right forearm showed no acute findings      including any fractures, dislocation, soft tissue abnormality.   CONSULTATIONS:  Artist Pais. Mina Marble, MD, of Hand Surgery.   BRIEF ADMITTING HISTORY AND PHYSICAL:  [name] is a pleasant 65-year-  old male with a history of ischemic cardiomyopathy and congestive heart  failure with ejection fraction of 30% followed by [facility],  diabetes mellitus with hemoglobin A1c of 6.5, hypertension,  hyperlipidemia, presenting with 5 days following a scratch  by his cat  while petting.  This cat has been vaccinated per the patient's report.  Scratch was at the PIP of the right index finger.  The pain at this site  slowly and progressively worsened since this scratch occurred with  increased swelling over the past 2-3 days prior to admission.  The  patient can still flex the joint but has decreased range of motion  secondary to swelling.  Pain is worse to proximal to the scratch up to  one third of the elbow.  There is increased redness in the medial parts  of the finger.  Of note is that blisters at the tip of his fingers where  he had suffered a cut about 1-2 months ago.  Following this scratch, it  had swollen as well and remains black in 2 areas where there are blood  blisters.  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  The  patient denies any fevers, chills, nausea, vomiting, diarrhea, or other  systemic signs of infection.  The patient states that ibuprofen helped  his symptoms initially, but in no  way did it resolve them completely.  The patient also states that the tip of his finger caught on a zipper  morning of admission, which resulted in tearing open of his blood  blister.  The patient states that the only discharge from this blister  was bright blood and denies seeing any purulent discharge.   On exam, the patient had a temperature of 97 degrees, blood pressure  152/75, pulse of 80, respiratory rate of 18, oxygen saturation of 96% on  room air.  In general, the patient was in no acute distress.  Eye exam  showed extraocular motions to be intact.  Anicteric sclerae.  Pupils  equal, round, and reactive to light.  ENT exam showed oropharynx clear  and without lesions with moist mucous membranes.  Neck exam showed no  JVD or lymphadenopathy.  Respiratory exam was clear to auscultation  bilaterally.  Cardiovascular exam showed normal S1 and S2, regular rate  and rhythm.  No murmurs, rubs, or gallops and a well-healed  ICD/pacemaker implantation scar.  GI exam showed positive bowel sounds  with no tenderness to palpation and a soft nondistended abdomen.  Extremity exam showed swelling of the right index finger with an open  1/2-inch sore on the anterior tip.  There was also a black blister-like  formation 1 cm in diameter on the lateral side of the distal right  finger as well.  There is a superficial scratch on the dorsal PIP joint  of the right index finger.  There is diffuse swelling of the right index  finger with erythema one-fourth of the way up the dorsal radial aspect  of the right forearm as well.  Lymph exam showed no lymphadenopathy.  Neuro exam showed the patient to be alert and oriented x3.  Cranial  nerves II through XII were intact.  A 5/5 strength throughout.  Slightly  decreased sensation to the light touch in the distal fingers of both  hands as well as the toes of both feet.  A 2+ deep tendon reflexes  throughout and normal gait.  Psych exam was appropriate.    LABORATORY DATA:  On admission included a white blood cell count of 9.4,  ANC 7.6, hemoglobin 12.6, MCV of 88, platelets 179.  Sodium 139,  potassium 4.5, chloride 107, bicarb 23, BUN 18, creatinine 1.11, glucose  192.  Total bilirubin 0.8, alk phos 38,  AST 14, ALT 13, total protein  5.9, albumin 3.5, calcium 9.0.  INR 1.0, PT 13.3, PTT 21.   HOSPITAL COURSE:  1. Cellulitis of the right hand and right index finger.  On admission,      the patient was started on broad-spectrum antibiotics including IV      vancomycin and IV Zosyn.  Since initial plain film of the right      hand and fingers were concerning for osteomyelitis, Hand Surgery      was consulted.  However after examining the patient, they      recommended that surgery was not indicated and that management      should mainly be focused on continued IV antibiotics as the      radiographic changes noted were more consistent with chronic      degenerative joint disease from his osteoarthritis and less likely      due to be osteomyelitis.  The patient initially had blood cultures      that were drawn prior to the start of antibiotics; however, these      remained negative by day of discharge.  The patient was maintained      on IV antibiotics for a total of 3 days and continued clinical      improvement was seen throughout this time.  The patient developed      no fevers or systemic signs of infection throughout his hospital      course.  By the day of discharge, the patient's swelling,      tenderness, and erythema had subsided markedly and he was      transitioned to oral antibiotics (Augmentin and Septra DS).      Because of his history of recent cat scratch, it was important to      cover for Bartonella infection with his oral antibiotic regimen,      which he will be maintained on for 2 weeks post discharge.  The      patient's pain was well controlled with a combination of oral      Percocet and Vicodin.  The patient will be  followed up on an      outpatient basis by Dr. Mina Marble of Hand Surgery for assessment of      further resolution of his right hand infection upon hospital      discharge.  2. Congestive heart failure with ejection fraction of 30%.  On      admission, the patient did not complain of any chest pain or      shortness of breath and this remained the case throughout the rest      of his hospital course.  The patient is currently being followed by      [facility] for this issue and he is status post ICD      placement.  The patient was continued on his home CHF medication      regimen including his lisinopril, aspirin, Coreg.  No changes to      these medications were made upon his discharge.  3. Diabetes mellitus type 2.  The patient's hemoglobin A1c was within      goal at 6.5.  He was continued on his glipizide and started on a      sliding scale insulin; however, his metformin was held as we were      unsure if he would need further imaging with contrast of his      infected hand.  The  patient's blood glucose levels remained within      range throughout the course of his hospitalization and he was      transitioned back to his home glipizide and metformin dosage by day      of discharge.  4. Hypertension.  The patient was continued on his home      antihypertensive regimen and his blood pressure remained adequately      controlled throughout the course of his hospitalization.  No      changes were made to his medication upon discharge.  5. Hyperlipidemia.  A fasting lipid profile was obtained, which showed      a total cholesterol of 134, triglycerides of 268, HDL of 26, and      LDL of 54.  The patient was already on a high dose statin therapy      prior to his admission and this was continued throughout his      hospital course without changes.  He may benefit from addition of a      fibrate or niacin to help increase his HDL; however, we will defer      this option to his  primary care Treyden Hakim.   DISCHARGE LABORATORY DATA AND VITAL SIGNS:  On day of discharge, the  patient had a temperature of 98.1, blood pressure of 124/74, pulse of  69, respiratory rate of 18, oxygen saturation of 94% on room air.  He  had a white blood cell count of 5.6, hemoglobin of 12.5, platelets of  164.  Sodium 139, potassium 4.2, chloride 105, bicarb 25, BUN 17,  creatinine 1.25 glucose 113.  Total bilirubin 0.7, alk phos 33, AST 15,  ALT 13, total protein 6.5, albumin 3.6, calcium 9.2.  Blood cultures  remained negative by day of discharge.      Lucy Antigua, MD  Electronically Signed      Ileana Roup, M.D.  Electronically Signed    RK/MEDQ  D:  07/24/2008  T:  07/24/2008  Job:  [id number]   cc:   Artist Pais Mina Marble, M.D.  Jimmie Molly, M.D.

## 2010-12-12 NOTE — Discharge Summary (Signed)
NAME:  Randy [name], Randy [name] NO.:  [id number]   MEDICAL RECORD NO.:  [id number]          PATIENT TYPE:  INP   LOCATION:  3740                         FACILITY:  [facility]   PHYSICIAN:  Doylene Canning. Ladona Ridgel, MD    DATE OF BIRTH:  08-Mar-1946   DATE OF ADMISSION:  05/20/2008  DATE OF DISCHARGE:  05/21/2008                               DISCHARGE SUMMARY   This patient has no known drug allergies.   Time for exam and dictation greater than 35 minutes.   FINAL DIAGNOSIS:  Discharging day 1 status post implant of a [employer]. Jude  Current VR RF single-chamber cardioverter defibrillator with  defibrillator threshold less than or equal to 15 joules.   SECONDARY DIAGNOSES:  1. Out-of-hospital myocardial infarction in 1998.  2. Myoview study, July 2009, ejection fraction 32%, large      inferolateral infarct/scar, mild peri-infarct ischemia.  3. Left heart catheterization, May 14, 2008, chronic occlusion of      the right coronary artery.  The right ramus intermediate had a      tandem 95/90 stenoses (unable to percutaneous coronary      intervention).  Left anterior descending coronary artery had      nonobstructive disease, 50-40 stenoses, ejection fraction was 25%      at the study.  4. [place] Heart Association class II chronic systolic congestive      heart failure.  5. Hypertension.  6. Diabetes.  7. History of tobacco habituation.  8. Left rotator cuff repair June 2009.  9. Cervical fusion, April 09, 2008.   PROCEDURE:  On May 20, 2008, implant of a St. Jude Current single-  chamber cardioverter defibrillator, Dr. Lewayne Bunting.  The patient had  no post-procedural complications.  No hematoma on examination.  Chest x-  ray shows the lead is appropriate in position.  No pneumothorax.  The  device will be interrogated prior to the patient's discharge.  The  patient's mobility of the left arm and incision care have been  discussed.  He has followup appointments.   BRIEF HISTORY:  Randy [name] is a 65 year old male.  He is a patient of  Dr. Gala Romney.  He has ischemic cardiomyopathy secondary to out-of-  hospital myocardial infarction in 1998.  He has known occlusion of the  right coronary artery.  His ejection fraction is as low as 25% at  catheterization on May 14, 2008.  Ejection fraction was 30% by  echocardiogram.  The patient has Class I-II heart failure symptoms.  He  has never had syncope.  He does not describe much in the way of  palpitations.  The patient is a candidate for prophylactic ICD  insertion.  There is evidence of a large inferolateral scar from Myoview  study.  The risks and benefits have been described to the patient.  He  wishes to proceed.   HOSPITAL COURSE:  The patient presents electively on May 20, 2008.  He underwent implantation of the St. Jude device the same day by Dr.  Ladona Ridgel and is ready for discharge postprocedure day #1 as described  above.  His medications at discharge are:  1. Darvocet-N 100 one to two tabs every 4-6 hours as needed for pain.      This is new prescription that has been given.  2. Enteric-coated aspirin 81 mg daily.  3. Lisinopril 20 mg daily.  4. Metformin 1000 mg twice daily.  5. Glipizide 5 mg daily.  6. Coreg 6.25 mg twice daily.  7. Lovastatin 40 mg daily at bedtime.  8. Fish oil capsule as before this admission.   FOLLOWUP APPOINTMENTS:  1. [facility] on June 24, 2008, [place].  2. [facility] on Monday, June 07, 2008 at 9:20.  3. He sees Dr. Gala Romney on Tuesday, July 06, 2008 at 1345.  4. Dr. Ladona Ridgel in January 2010.  His office will call with that      appointment.   Preoperative labs are as follows:  Complete blood count; white cells  6.4, hemoglobin of 13.3, hematocrit 39.7, platelets 183.  Sodium 139,  potassium 4.6, chloride 105, carbonate 22, BUN is 23, creatinine 1.3,  glucose 128, protime 13.2, INR 1.0.      Randy Mirza,  PA      Doylene Canning. Ladona Ridgel, MD  Electronically Signed    GM/MEDQ  D:  05/21/2008  T:  05/21/2008  Job:  [id number]   cc:   Melvern Banker

## 2010-12-12 NOTE — Consult Note (Signed)
NAME:  ILARIO, DHALIWAL               ACCOUNT NO.:  [id number]   MEDICAL RECORD NO.:  [id number]          PATIENT TYPE:  OIB   LOCATION:  4743                         FACILITY:  [facility]   PHYSICIAN:  Peter C. Eden Emms, MD, FACCDATE OF BIRTH:  [birth year]   DATE OF CONSULTATION:  04/09/2008  DATE OF DISCHARGE:                                 CONSULTATION   PRIMARY CARDIOLOGIST:  Bevelyn Buckles. Bensimhon, MD   PRIMARY CARE Arnella Pralle:  [facility].   REQUESTING PHYSICIAN:  Mark C. Ophelia Charter, MD   PATIENT PROFILE:  A 65 year old Caucasian male with history of presumed  CAD and ischemic cardiomyopathy who we were asked to evaluate secondary  to asymptomatic wide complex rhythm.   PROBLEMS:  1. Presumed coronary artery disease.      a.     Cardiolite 2001, ejection fraction 40-50%, inferior and       anteroseptal infarct, no ischemia.      b.     Myoview July 2009, ejection fraction 32%, inferolateral       infarct, very mild peri-infarct ischemia.  2. Ischemic cardiomyopathy.      a.     February 09, 2008, 2-D echocardiogram, ejection fraction 40%       inferoseptal akinesis, inferior akinesis, and basal-to-mid       posterior akinesis.  3. Hypertension.  4. Hyperlipidemia.  5. Type 2 diabetes mellitus.  6. Remote tobacco abuse, 30-pack-year history, quitting in 1986.  7. Chronic back and neck pain.      a.     Status post lumbar surgery, December 2001.      b.     Cervical spondylosis, status post fusion today.      c.     Status post left rotator cuff repair, June 2009.      d.     Mild obstructive sleep apnea.   HISTORY OF PRESENT ILLNESS:  A 65 year old Caucasian male with a history  of presumed CAD and ischemic cardiomyopathy as outlined above.  Echocardiogram in July 2009 showed EF of 40%.  The patient presented  today for cervical fusion with Dr. Annell Greening.  From a cardiac  perspective, he has been doing well without any chest pain or shortness  of breath.  He further denies any PND,  orthopnea, edema, or syncope.  Postoperatively, the patient was noted to have a short run of  accelerated idioventricular rhythm versus junctional rhythm with an  aberrant conduction.  He was asymptomatic.  We have been asked to  evaluate.   ALLERGIES:  No known drug allergies.   HOME MEDICATIONS:  1. Aspirin 81 mg daily.  2. Metformin 1000 mg b.i.d.  3. Lisinopril 20 mg daily.  4. Glipizide 2.5 mg b.i.d.  5. Coreg 6.25 mg b.i.d.  6. Lovastatin 40 mg at bedtime.   FAMILY HISTORY:  Mother died at age 79 with a history of diabetes.  Father died at 67 of an MI.  He has a brother, who has diabetes.   SOCIAL HISTORY:  Lives in [place] with a friend.  He is retired.  He is married.  He  has a 30-pack-year history of tobacco abuse, quitting  in 1986.  He will occasionally have an alcoholic beverage.  Denies drug  use and does not routinely exercise.   REVIEW OF SYSTEMS:  Positive for neck and back pain.  Otherwise, all  systems reviewed are negative.   PHYSICAL EXAMINATION:  VITAL SIGNS:  Temperature 97.2, heart rate 80,  respirations 16, blood pressure 128/76, pulse ox 96% on 2 L.  GENERAL:  Pleasant white male in no acute distress, awake, alert, and  oriented x3.  HEENT:  Normal.  Nares grossly intact and nonfocal.  SKIN:  Warm and dry without lesions or masses.  NECK:  Difficult to assess as he is wearing a cervical collar.  LUNGS:  Respirations are unlabored, clear to auscultation.  CARDIAC:  Regular S1 and S2.  No S3, S4, or murmurs.  ABDOMEN:  Round, soft, nontender, and nondistended.  Bowel sounds  present x4.  EXTREMITIES:  Warm, dry, and pink.  No clubbing, cyanosis, or edema.  Dorsalis pedis and posterior tibial pulses are 2+ and equal bilaterally.   EKG is pending.   LABORATORY WORK:  Hemoglobin 14.2, hematocrit 42.8, WBC 6.5, platelets  191.  Sodium 139, potassium 4.8, chloride 105, CO2 of 24, BUN 24,  creatinine 1.22, glucose 115, total bilirubin 0.8, alkaline  phosphatase  43, AST 22, ALT 20, total protein 6.8, albumin 4.3.   ASSESSMENT AND PLAN:  1. Ischemic cardiomyopathy/presumed coronary artery disease, doing      well without symptoms and also appears to be euvolemic.  He has a      short run of AIVR versus junctional rhythm with aberrant conduction      postoperatively.  He has been asymptomatic.  We will continue his      beta blocker, ACE inhibitor, aspirin, and statin.  Could consider      titration of beta-blocker if he has blood pressure and heart rate      for it.  We will check a BMET and magnesium as well as cycle      cardiac markers.  If lab work is unrevealing, he would be okay for      discharge in the a.m. from cardiac perspective.  2. Diabetes mellitus.  Continue home regimen.  3. Hyperlipidemia.  Continue lovastatin.  4. Hypertension, stable.      Nicolasa Ducking, ANP      Noralyn Pick. Eden Emms, MD, [facility]  Electronically Signed    CB/MEDQ  D:  04/09/2008  T:  04/10/2008  Job:  [id number]

## 2010-12-12 NOTE — Assessment & Plan Note (Signed)
[facility]                            CARDIOLOGY OFFICE NOTE   NAME:Valone, KELWIN GIBLER                      MRN:          [id number]  DATE:05/11/2008                            DOB:          1945-09-25    PRIMARY CARE PHYSICIAN:  [facility].   INTERVAL HISTORY:  Randy [name] is a very pleasant 65 year old male with a  history of hypertension, hyperlipidemia, previous tobacco use and  diabetes.  He also has coronary artery disease with a previous out of  hospital inferior myocardial infarction back in the late 90s.  He has  never had a cardiac catheterization.  He did have a recent Myoview in  July which showed an EF of 32% with inferior lateral infarct and mild  peri infarct ischemia near the apex.  Ejection fraction was 32%.  Echocardiogram showed an EF of 30% with inferior akinesis.  His LV was  mildly dilated.   Since we last saw him he underwent neck surgery for a cervical fusion.  He tolerated this fairly well.  In the postop period he had a few beats  of what appeared to be junctional rhythm.  This was asymptomatic.   He continues to do fairly well, denies any chest pain.  He does note  that he occasionally gets short of breath after walking about a half  mile.  He has not had any orthopnea, PND.  No lower extremity edema.  No  palpitations.  No syncope or presyncope.   CURRENT MEDICATIONS:  1. Include aspirin 81 a day.  2. Metformin 1000 b.i.d.  3. Lisinopril 20 a day.  4. Glipizide 5 mg a day.  5. Lovastatin 40 a day.  6. Coreg 6.25 b.i.d.   PHYSICAL EXAMINATION:  GENERAL:  He is well-appearing and in no acute  distress.  Ambulates around the clinic without any respiratory  difficulty.  Affect is very pleasant.  VITAL SIGNS:  Blood pressure is 126/68, heart rate 65, weight is 203.  HEENT:  Normal.  NECK:  Supple with a well-healed surgical scar.  There is no  lymphadenopathy or thyromegaly.  Carotids are 2+ bilaterally without  bruits.  CARDIAC:  PMI is nondisplaced.  He is regular with an S4, no murmur.  LUNGS:  Clear.  ABDOMEN:  Abdomen is obese, nontender, nondistended.  No  hepatosplenomegaly, no bruits, no masses.  EXTREMITIES:  Warm with no cyanosis, clubbing or edema.  No rash.  NEUROLOGICAL:  Alert and oriented x3.  Cranial nerves II-XII grossly  intact.  Moves all 4 extremities without difficulty.  Affect is  pleasant.   EKG shows normal sinus rhythm at a rate of 65 with small inferior Q  waves.  No significant ST-T wave abnormalities.   ASSESSMENT AND PLAN:  1. Coronary artery disease.  He has never had a cardiac      catheterization.  He has obviously had a large previous inferior      myocardial infarction.  His LV function is mildly depressed.  He      actually is in the range where he would be a candidate for an ICD      (  implantable cardioverter defibrillator).  I am considering      referring him to EP, however, given his risk factors I think it is      important to rule out diffuse coronary disease and we will proceed      with cardiac catheterization to clearly define his coronary anatomy      and see if there is any role for revascularization.  2. LV dysfunction.  He is NYHA class II.  He is euvolemic.  He is on      good medicines.  We will increase his Coreg to 9.75 b.i.d. and      titrate this as tolerated.  3. Hyperlipidemia.  Total cholesterol is 167, triglycerides 252, HDL      is 27 and LDL is 94.  We will increase his lovastatin to 40 and add      fish oil.  We previously considered TriCor but he was unable to      afford this.   DISPOSITION:  Pending the results of his catheterization I suspect he  will eventually need referral to EP for consideration of prophylactic  defibrillator.     Bevelyn Buckles. Bensimhon, MD  Electronically Signed    DRB/MedQ  DD: 05/11/2008  DT: 05/11/2008  Job #: [id number]   cc:   Melvern Banker

## 2010-12-12 NOTE — Consult Note (Signed)
NAME:  Randy [name], Randy [name]               ACCOUNT NO.:  [id number]   MEDICAL RECORD NO.:  [id number]          PATIENT TYPE:  AMB   LOCATION:  SDS                          FACILITY:  [facility]   PHYSICIAN:  Bevelyn Buckles. Bensimhon, MDDATE OF BIRTH:  [birth year]   DATE OF CONSULTATION:  01/16/2008  DATE OF DISCHARGE:                                 CONSULTATION   .   REFERRING PHYSICIAN:  Mark C. Ophelia Charter, M.D.   PRIMARY CARE PHYSICIAN:  [facility].   CARDIOLOGIST:  Dr. Juanito Doom who he has not seen 2002.   REQUESTING PHYSICIAN:  As Dr. Georgina Pillion for anesthesiology.   REASON FOR CONSULTATION:  Preoperative cardiac evaluation.   HISTORY OF PRESENT ILLNESS:  Randy [name] is a very pleasant 65 year old  male with a history of hypertension, hyperlipidemia, previous tobacco  use and diabetes.  He also has presumed coronary artery disease with  mild LV dysfunction.   Was noted back in 2001 that he had an abnormal EKG.  He had a Cardiolite  which showed an EF of 45%-50% with a small inferior and anteroseptal  infarction.  There is no significant ischemia.  He has not had followup  on his cardiac condition.  He denies any history of chest pain.  He does  have chronic shortness of breath.  He is recently found to have a severe  rotator cuff tear and now is going for an open shoulder surgery.  We are  asked to provide preoperative cardiac evaluation.   He is at baseline quite active.  He does a lot of weed-whacking around  the yard.  He is able to walk up to 3 or 4 flights of stairs with no  chest pain or significant difficulty.  Says he can walk at least a mile  at a time without any problems.  He has not had any problems with heart  failure.  No syncope.  No palpitations.   Review of symptoms is notable primarily for a shoulder pain.  He does  have some other arthritis pain.  No nausea, vomiting, fevers, chills,  cough, bright red blood per rectum or melena.  Remainder of review of  systems is negative  except for HPI and problem list.   PROBLEM LIST:  1. A presumed coronary artery disease      a.     Cardiolite in 2001 showed an EF of 45%-50% with inferior and       anteroseptal infarct.  No ischemia.  2. Chronic hypertension.  3. Diabetes.  4. Hyperlipidemia.   CURRENT MEDICATIONS:  1. Aspirin.  2. Metformin.   ALLERGIES:  No known drug allergies.   SOCIAL HISTORY:  He is separated.  He is a retired Engineer, building services.  He has  a history tobacco but quit in 1986.   FAMILY HISTORY:  Father died at age 71 due to myocardial infarction.  Mother died at 44 just a few months after her husband, suspected just  due to morning and depression.   PHYSICAL EXAM:  He is well-appearing, no acute distress, lying flat in  bed without any respiratory difficulty.  Blood pressure 129/76.  Heart rate is 82.  Temperature is 98.4.  He is  satting 93% on room air.  HEENT is normal.  NECK:  Supple.  There is no JVD.  Carotid 2+ bilaterally, no bruits.  There is no lymphadenopathy or thyromegaly.  CARDIAC:  PMI is  nondisplaced.  He has a Regular rate and rhythm with S4, no murmurs.  LUNGS:  Clear.  Abdomen is obese, nontender, nondistended.  No  hepatosplenomegaly, no bruits.  No masses.  Good bowel sounds.  EXTREMITIES:  Warm with no cyanosis, clubbing or edema.  No rash.  NEURO:  Alert and oriented x3.  Cranial nerves II-XII are intact.  Moves  all 4 extremities difficulty.  Affect is very pleasant.   LABS:  Show white count of 8.7, hemoglobin 14.3, platelets of 202.  Sodium 135, potassium 4.0, BUN of 14, creatinine of 1.04.  EKG shows  some sinus rhythm with small inferior Q-waves.  These are old.  He does  have also some T-wave versions on V4-V6 which are more prominent than  previous.   ASSESSMENT:  1. Presumed coronary artery disease with a previous inferior infarct      by Myoview.  2. Mild LV dysfunction.  3. Diabetes.  4. Rotator cuff tear pending surgery.   I had a long discussion  with Mr. Mihelich.  Ideally, we would proceed  with a followup Myoview prior to going to the OR to further risk  stratify him.  However, given his good functional capacity, I think he  is probably okay to proceed to the OR without this.  I explained these  two situations with him and told him that certainly the safer way to do  it would be to cancel surgery and proceed with the Myoview but we could  go directly to the OR with just a slightly higher risk of not knowing.  He understood these issues clearly and would like to proceed to the OR  despite the fact that he realizes it may be slightly higher risk.  I  have discussed this issue with doctors Georgina Pillion and Ophelia Charter and we will  proceed to the operating room.  We will follow him closely after  surgery.  He will need to followup with cardiology and as an outpatient.      Bevelyn Buckles. Bensimhon, MD  Electronically Signed     DRB/MEDQ  D:  01/16/2008  T:  01/16/2008  Job:  [id number]

## 2010-12-15 NOTE — Procedures (Signed)
   NAME:  Randy [name], Randy [name]                         ACCOUNT NO.:  [id number]   MEDICAL RECORD NO.:  [id number]                   PATIENT TYPE:  OUT   LOCATION:  RAD                                  FACILITY:  [facility]   PHYSICIAN:  Thomas C. Wall, M.D.                DATE OF BIRTH:  [birth year]   DATE OF PROCEDURE:  DATE OF DISCHARGE:                                  ECHOCARDIOGRAM   PROCEDURE:  Exercise echocardiogram for Dr. Valera Castle   INDICATIONS:  Mr. Wild is a 65 year old male with known coronary artery  disease.  He had an echocardiogram and Cardiolite in 2001 revealing an old  inferior septal wall MI with an ejection fraction of 45-50%.  He denies any  history of angina or ischemic symptoms.   BASELINE DATA:  EKG shows sinus rhythm at 84 beats/minute with inferior Q  waves and inverted T waves.  Blood pressure is 130/88.  Echocardiogram  reveals inferior hypokinesis to akinesis.   The the patient exercised for a total of 10 minutes and 36 seconds to Bruce  protocol stage 4 and 12.9 METS.  He denied any chest discomfort.  He did  have some mild shortness of breath at the end of exercise which resolved in  recovery.  Maximal heart rate achieved was 157 beats/minute which is 96% of  predicted maximum.  Maximum blood pressure is 170/94.  EKG revealed a few  PVCs and no ischemic changes.  Echocardiogram revealed inferior hypokinesis  to akinesis which did not worsen with exercise and no new wall motion  abnormalities with exercise were noted.   Final images and results are pending MD review.     Amy Mercy Riding, P.A. [facility]                     Thomas C. Wall, M.D.    AB/MEDQ  D:  01/07/2003  T:  01/07/2003  Job:  [id number]

## 2010-12-15 NOTE — Procedures (Signed)
   NAME:  Randy [name], Randy [name]                         ACCOUNT NO.:  [id number]   MEDICAL RECORD NO.:  [id number]                   PATIENT TYPE:  OUT   LOCATION:  RAD                                  FACILITY:  [facility]   PHYSICIAN:  Thomas C. Wall, M.D.                DATE OF BIRTH:  [birth year]   DATE OF PROCEDURE:  01/07/2003  DATE OF DISCHARGE:                                  ECHOCARDIOGRAM   PROCEDURE:  Stress echocardiogram.   INDICATIONS FOR STUDY:  414.01.   Baseline electrocardiogram showed normal sinus rhythm with ST segment  depression with biphasic T waves in II, III, and AVF as well as in V5 and  V6.   [name] is a 65 year old gentleman who has had a previous inferior,  inferior septal infarct with an ejection fraction of about 45% on last  study.   Baseline echocardiogram shows inferior wall akinesia and inferior septal  hypokinesia.  Ejection fraction around 45-50%.  There was no valvular  abnormalities.   He exercised on the Bruce protocol.  Total exercise time was 10 minutes and  36 seconds reaching a MET level of 12.9.  His blood pressure went from  130/88 to 170/94.  Maximum heart rate was 157 which is 96% of predicted  maximum heart rate.   EKG was indeterminate.   Test was stopped secondary to reaching a target heart rate and also dyspnea.   Poststressed images showed no new wall motion abnormalities or any evidence  of echocardiographic ischemia.   CONCLUSIONS:  1. Good exercise tolerance.  2.     Indeterminate EKG based on baseline abnormalities prior to stress.  3. Previous inferior wall, inferior septal infarct.  Ejection fraction     around 50%.  4. No echocardiographic evidence with adequate exercise.                                               Thomas C. Wall, M.D.    TCW/MEDQ  D:  01/07/2003  T:  01/07/2003  Job:  [id number]   cc:   Tammy R. Collins [place], M.D.  [address]  [place]  [place] [address]  Fax: [phone]

## 2010-12-15 NOTE — Discharge Summary (Signed)
[place]  Patient:    Randy [name], Randy [name]                      MRN: [id number] Adm. Date:  [id number] Disc. Date: 07/08/00 Attending:  Barton Fanny                           Discharge Summary  ADMISSION HISTORY AND PHYSICAL:  The patient is a 65 year old man with low back pain and bilateral lumbar radiculopathy, left worse than right, who had stenosis at the L3-4 level and a grade II spondylolisthesis at L3 and L4.  He also has bilateral carpal tunnel syndrome.  The patient was admitted for decompressive laminectomy and arthrodesis.  History is notable for a heart disease for which his cardiologist is [name] C. Wall, M.D.  General examination was notable for a deformity of the right thumb.  Neurologic examination showed intact strength, having a diminished pinprick in the first, second, and third digits of his hands bilaterally but intact sensation in the distal lower extremities.  HOSPITAL COURSE:  The patient was admitted and underwent an L3 Gill procedure and partial L2 and partial L4 laminectomy, an L3-4 discectomy on the left side, a posterior lumbar interbody fusion with a Synthes _________ spacer, 9 mm in height, and left iliac crest bone graft and posterolateral arthrodesis with left iliac bone graft and clix post instrumentation.  Postoperatively, he did fairly well.  Physical therapy and occupational therapy worked with the patient postoperatively.  He was up and ambulating in the halls.  He was afebrile.  His wound was healing well.  He has been discharged to home with instructions regarding wound care and activities.  FOLLOW-UP:  He is to return to my office in three weeks for follow-up and have AP and lateral lumbar spine x-rays done at that time.  DISCHARGE MEDICATIONS:  He has been given a prescription for Percocet one or two tablets p.o. q.4-6h. p.r.n. pain 60 tablets prescribed with no refills. He is also advised to use  Aleve two tablets b.i.d.  DISCHARGE DIAGNOSES:  Bilateral L3 pars interarticularis defect with an associated grade II spondylolisthesis L3 and L4 and resulting severe stenosis at the L3-4 level and bilateral lumbar radiculopathy as well as degenerative disc disease and spondylosis. DD:  07/08/00 TD:  07/08/00 Job: [id number]

## 2010-12-15 NOTE — H&P (Signed)
[place]  Patient:    Randy [name], Randy [name]                      MRN: [id number] Adm. Date:  [id number] Attending:  Barton Fanny                         History and Physical  HISTORY OF PRESENT ILLNESS:  The patient is a 65 year old right-handed white male who was evaluated for low back and left lumbar radicular pain secondary to L3/4 lumbar stenosis secondary to grade 2 L3/4 spondylolithesis.  He has been having difficulty with numbness and tingling in his hands as well.  He has had difficulty with his low back for many years.  It has been worse for the past couple of years.  He has been having pain to the left lower extremity for the past couple years which has been gradually worsening and a bit of discomfort into the right lower extremity as well, but circulates worse through the left lower extremity.  He does not describe any numbness or paraesthesias in the lower extremities and he denies any weakness in the lower extremities.  He does note numbness and tingling in the digits of his hands, worse in the left than the right involving the first, second, and third digits.  He has difficulty picking up small things such as tacks and nails.  Patient is admitted now for L3 and L4 laminectomy for decompression and an L3/4 diskectomy and posterior lumbar antibody fusion with Pliss spacers and bone graft and a posterolateral arthrodesis with posterior instrumentation and bone graft.  The plan is to proceed with carpal tunnel releases during his recuperative period from his back surgery.  PAST MEDICAL HISTORY:  Notable for a history of heart disease.  He has undergone extensive cardiac evaluation by Dr. Juanito Doom including stress test and echocardiogram.  He has a remote MI.  He has no history of hypertension, cancer, stroke, diabetes, peptic ulcer disease, or lung disease.  PAST SURGICAL HISTORY:  None.  ALLERGIES:  No known drug  allergies.  MEDICATIONS:  Lipitor and Accupril.  FAMILY HISTORY:  Both parents have passed on.  His mother died at age 37 of diabetes.  She had been in poor health.  His father died at age 7 of a heart attack.  He had been in fair health.  SOCIAL HISTORY:  Patient is self-employed.  He is a Visual merchandiser.  He is married.  He does not smoke.  He does drink alcoholic beverages socially.  He denies a history of substance abuse.  REVIEW OF SYSTEMS:  Notable for those described in history of present illness and past medical history, but otherwise unremarkable.  PHYSICAL EXAMINATION:  GENERAL:  Patient is a well-developed, well-nourished white male in no acute distress.  VITAL SIGNS:  Temperature 97.2, pulse 66, blood pressure 117/62, respiratory rate 16, height 5 feet 9 inches, weight 215 pounds.  LUNGS:  Clear to auscultation.  He has symmetrical respiratory excursion.  HEART:  Regular rate and rhythm.  No S1, S2.  There is no murmur.  ABDOMEN:  Soft, nontender.  Bowel sounds are present.  EXTREMITIES:  Deformity of the right thumb.  He had a severe dislocation of the right thumb which was relocated 30 years ago by Dr. Trellis Paganini.  There is no cyanosis, clubbing, or edema of the lower extremities.  MUSCULOSKELETAL:  No tenderness to palpation of  the lumbar spinous processes or paralumbar muscular.  He is able to flex to 90 degrees.  He is able to extend well without significant discomfort on flexion and extension.  Straight leg raising is negative bilaterally.  NEUROLOGIC:  Strength 5/5 in the lower extremities including ileus psoas, quadriceps, dorsiflexors, plantar flexors, ______ longus.  Sensation is intact to pin prick to the distal lower extremities.  However, in his hands he has diminished pin prick in the first, second, and third digits of his hands bilaterally.  Reflexes are 1-2 with the biceps, ______, and triceps ______ quadriceps very minimal bilaterally.  Toes are  downgoing bilaterally.  He has a normal gait and stance.  Additional testing shows negative Tinels bilaterally at the palms and ______ shows 5/5 strength.  IMPRESSION: 1. Bilateral carpal tunnel syndrome. 2. Low back pain and bilateral lumbar radiculopathy, left worse than right    secondary to grade 2 spondylolithesis L3/4 with resulting severe spinal    stenosis. 3. Stable atherosclerotic coronary artery vascular disease.  PLAN:  Patient admitted for an L3/4 laminectomy with partial ______, bilateral L3/4 posterior lumbar antibody fusion with ______ Pliss spacers in a posterior lateral arthrodesis with posterior instrumentation and iliac crest bone graft.  The patient did undergo EMG and nerve conduction studies which found severe median neuropathies with complete nerve blocks and severe carpal tunnel syndrome.  Patient had been scheduled for carpal tunnel release prior to his lumbar surgery; however, he asked that it be postponed until he is in the recuperative phase of his lumbar surgery. DD:  07/04/00 TD:  07/04/00 Job: [id number]

## 2010-12-15 NOTE — Op Note (Signed)
[place]. [facility]  Patient:    Randy [name], Randy [name]                      MRN: [id number] Proc. Date: 07/04/00 Adm. Date:  [id number] Attending:  Barton Fanny                           Operative Report  PREOPERATIVE DIAGNOSIS:  Bilateral L3 pars interarticularis defects, grade 2 L3 and L4 spondylolisthesis, lumbar radiculopathy, and lumbar degenerative disk disease.  POSTOPERATIVE DIAGNOSIS:  Bilateral L3 pars interarticularis defects, grade 2 L3 and L4 spondylolisthesis, lumbar radiculopathy, and lumbar degenerative disk disease.  PROCEDURE:  L3 Gill procedure, partial L2 and partial L4 laminectomy, left L3-4 lumbar diskectomy and posterior lumbar interbody fusion, with Synthes T-PLIF spacer (9 mm) and left iliac crest bone graft, and a posterolateral arthrodesis with left iliac crest bone graft, and Clix posterior instrumentation.  SURGEON:  Hewitt Shorts, M.D.  ASSISTANT:  Stefani Dama, M.D.  ANESTHESIA:  General endotracheal.  INDICATIONS:  The patient is a 65 year old man with low back pain and bilateral lumbar radiculopathy, left worse than right, who was found by x-ray and MRI scan to have grade 2 L3 and L4 spondylolisthesis that was suspected to be due to bilateral pars defects, with associated radiculopathy and degenerative disk disease.  Decision was made to proceed with decompression and arthrodesis.  DESCRIPTION OF PROCEDURE:  Patient brought to the operating room and placed under general endotracheal anesthesia.  The patient was turned to a prone position.  The lumbar region was prepped with Betadine soap and solution and draped in a sterile fashion.  The midline was then instilled with local anesthetic with epinephrine and a midline incision made, carried down through the subcutaneous tissue.  Bipolar cautery and electrocautery used to maintain hemostasis.  Dissection was carried down to the lumbar fascia, which  was incised bilaterally, and the paraspinal muscles were dissected from the spinous processes and laminae in a subperiosteal fashion.  Localizing x-ray was taken.  The L3 and L4 spinous processes and laminae were identified. Dissection was carried out laterally.  We were able to expose the transverse processes of L3 and L4 and the intertransverse space.  We then dissected around the facet joint of L3 and L4 and found that, in fact, there was no L3 pars interarticularis.  We freed the ligamentum flavum from the L3 posterior elements as well as the spondylitic changes around the pseudoarthrosis.  We carefully removed the interspinous ligament between L2 and L3 and L3 and L4 and then carefully removed the posterior elements of L3.  Notably, there was significant pseudoarthrosis and spondylitic overgrowth at the rostral aspect of the L3 lamina, continuing to cause compression, and this was dissected from the underlying dura and removed and its stenosis decompressed.  A medial facetectomy was also performed, and then we examined the foramina for the L3 nerve roots.  There was tremendous pseudoarthrosis and spondylitic overgrowth, and this was carefully removed and the nerve roots decompressed.  Both the nerve roots appeared to have been chronically compressed, left worse than right.  We then used a double-action rongeur to remove cortex overlying the posterior aspect of the pedicles and using pedicle probes, we were able to probe the L3 and L4 pedicles bilaterally.  An x-ray was taken, and the pedicle probes were in good position in the midportion of the pedicles bilaterally. We then examined  the ventral aspect of the spinal canal.  There was a large step-off between the L3 and L4 vertebrae with a large pseudo-disk bulge.  We began diskectomy with removal of that pseudo-disk bulge.  The disk space itself was markedly narrowed, and this was carefully opened, and using a variety of rongeurs, with  pituitary rongeurs, we were able to clear out the spondylitic changes within the disk space and prepare the disk space for an interbody fusion.  A thorough diskectomy was performed from the left side.  We were able to remove cartilaginous and spondylitic material back to the vertebral body surfaces.  Using a spacer, we determined that a 9 mm graft was the appropriate graft, and a 9 mm T-PLIF spacer was prepared.  We then harvested bone from the left iliac crest.  A separate fascial incision was made overlying the iliac crest, and dissection was carried down to it.  The fascia was incised, and the muscle attached along its posterior aspect was removed and elevated.  Using retraction to provide exposure, we used a variety of osteotomes, gouges, and curettes to harvest at first corticocancellous strips and then strips of cancellous bone.  These were set aside later to be implanted.  We then closed the iliac crest fascial incision with interrupted, undyed 1 Vicryl sutures, and then re-exposed the main lumbar incision.  We packed cancellous bone into the interbody space and then placed the 9 mm T-PLIF spacer and then packed additional cancellous bone in the interbody space.  Care was taken, though, to ensure that none was abutting of compressing the thecal sac.  We then took the remaining cancellous bone as well as the corticocancellous bone and placed it over the transverse process of L3 and L4 as well as the intertransverse space bilaterally.  The cortical surfaces of the transverse processes were decorticated prior to placing the bone.  We then placed 6.25 x 45 mm screws into each of the pedicles.  The Clix screw heads were placed.  We then cut an 85 mm pre-lordosed rod in half, and each half was placed on either side, and then the screw cap was placed and all four screw caps were tightened and had final tightening performed using a counter-torque wrench.  The remaining bone was packed in the  intertransverse space.  The wound was irrigated with saline and then bacitracin solution. Good hemostasis was established and confirmed, and then we proceeded with closure of the paraspinal muscle proximally with interrupted undyed 1 Vicryl  sutures, the deep fascia with interrupted undyed 1 Vicryl sutures, the subcutaneous and subcuticular closure with inverted 2-0 undyed Vicryl sutures, and the skin was reapproximated with Dermabond.  The patient tolerated the procedure well.  The estimated blood loss was 1100 cc.  The patient was given back 200 cc of Cell Saver blood.  More blood would have been returned to the patient, but the Cell Saver malfunctioned.  Sponge and needle count were correct.  Following surgery, patient was turned back to a supine position, reversed from his anesthetic, extubated, and transferred to the recovery room for further care. DD:  07/04/00 TD:  07/04/00 Job: [id number]

## 2010-12-15 NOTE — Op Note (Signed)
[name]. [facility]  Patient:    Randy [name], Randy [name]                      MRN: [id number] Proc. Date: 08/14/00 Adm. Date:  [id number] Attending:  Barton Fanny                           Operative Report  PREOPERATIVE DIAGNOSIS:  Bilateral carpal tunnel syndrome.  POSTOPERATIVE DIAGNOSIS:  Bilateral carpal tunnel syndrome.  PROCEDURE:  Left carpal tunnel release.  SURGEON:  Hewitt Shorts, M.D.  ANESTHESIA:  Bier block.  INDICATION:  The patient is a 65 year old man who presented with severe bilateral carpal tunnel syndrome, which he experiences left worse than right. The decision was made to proceed with bilateral carpal tunnel releases done in a sequential fashion, performing the left carpal tunnel release today.  DESCRIPTION OF PROCEDURE:  The patient was brought to the operating room.  A Bier block was administered by the anesthesia service.  The patient also received intravenous sedation.  The left upper extremity was prepped with Betadine soap and solution and draped in a sterile fashion.  A curvilinear fashion was made just medial to the thenar crease, extending proximally to just distal to the distal carpal crease.  Dissection was carried down through the subcutaneous tissue.  Bipolar cautery was used as needed.  Dissection was carried down to the transverse carpal ligament, which was incised in a proximal-distal extent.  It was quite thickened and was released entirely from its proximal to distal extent with care taken to leave the underlying nerve and other structures undisturbed.  Once the ligament was completely released, the wound was checked for hemostasis and then closed.  The subcutaneous tissue was approximated with interrupted inverted 2-0 undyed Vicryl sutures.  The skin edges were approximated with interrupted horizontal mattress sutures using 3-0 nylon.  The wound was dressed with Adaptic, fluff gauzes, and wrapped with a  Kling.  Once the bandage was applied, the tourniquet for the Bier block was released.  The patient tolerated the procedure well.  The actual blood loss was nil.  Sponge and needle count were correct.  Following surgery, the patient was returned to the recovery room for further care. DD:  08/14/00 TD:  08/14/00 Job: [id number]

## 2010-12-22 ENCOUNTER — Other Ambulatory Visit: Payer: Self-pay | Admitting: *Deleted

## 2010-12-22 MED ORDER — LOVASTATIN 20 MG PO TABS: ORAL_TABLET | ORAL | Status: DC

## 2011-01-11 ENCOUNTER — Other Ambulatory Visit: Payer: Self-pay | Admitting: Family Medicine

## 2011-02-09 ENCOUNTER — Other Ambulatory Visit: Payer: Self-pay | Admitting: Family Medicine

## 2011-02-15 ENCOUNTER — Ambulatory Visit (INDEPENDENT_AMBULATORY_CARE_PROVIDER_SITE_OTHER): Payer: Medicare Other | Admitting: *Deleted

## 2011-02-15 DIAGNOSIS — Z95810 Presence of automatic (implantable) cardiac defibrillator: Secondary | ICD-10-CM

## 2011-02-15 DIAGNOSIS — I428 Other cardiomyopathies: Secondary | ICD-10-CM

## 2011-02-16 ENCOUNTER — Other Ambulatory Visit: Payer: Self-pay | Admitting: Internal Medicine

## 2011-02-16 LAB — REMOTE ICD DEVICE
BRDY-0002RV: 40 {beats}/min
DEV-0020ICD: NEGATIVE
DEVICE MODEL ICD: 663191
HV IMPEDENCE: 73 Ohm
RV LEAD AMPLITUDE: 5.5 mv
TZAT-0001FASTVT: 1
TZAT-0001SLOWVT: 1
TZAT-0004FASTVT: 8
TZAT-0012FASTVT: 200 ms
TZAT-0019FASTVT: 7.5 V
TZAT-0020SLOWVT: 1 ms
TZON-0004FASTVT: 12
TZON-0005SLOWVT: 6
TZON-0010FASTVT: 80 ms
TZST-0001FASTVT: 3
TZST-0001FASTVT: 4
TZST-0001FASTVT: 5
TZST-0001SLOWVT: 2
TZST-0001SLOWVT: 4
TZST-0001SLOWVT: 5
TZST-0003FASTVT: 25 J
TZST-0003SLOWVT: 15 J
TZST-0003SLOWVT: 36 J

## 2011-02-21 NOTE — Progress Notes (Signed)
icd remote check  

## 2011-03-06 ENCOUNTER — Encounter: Payer: Self-pay | Admitting: *Deleted

## 2011-03-21 ENCOUNTER — Telehealth: Payer: Self-pay | Admitting: *Deleted

## 2011-03-21 NOTE — Telephone Encounter (Signed)
Prior Randy [name] is needed for lovastatin, form is on your desk.  Pt states he takes 4 of these daily.

## 2011-03-22 NOTE — Telephone Encounter (Signed)
Form faxed to [facility] at [phone] and given to [place].

## 2011-03-22 NOTE — Telephone Encounter (Signed)
In my box

## 2011-04-11 ENCOUNTER — Other Ambulatory Visit: Payer: Self-pay | Admitting: Family Medicine

## 2011-04-17 NOTE — Telephone Encounter (Signed)
Dr. Dayton Martes, do you want to change pt's dosage?

## 2011-04-17 NOTE — Telephone Encounter (Signed)
Yes ok to change to 40 mg- take two tablets daily.

## 2011-04-17 NOTE — Telephone Encounter (Signed)
Prior Berkley Harvey was denied for lovastatin 20 mg's, 4 a day dosing.  Letter is on your desk.  It looks like they might cover 40 mg's at 2 a day.

## 2011-04-26 LAB — URINALYSIS, ROUTINE W REFLEX MICROSCOPIC
Bilirubin Urine: NEGATIVE
Nitrite: NEGATIVE
Specific Gravity, Urine: 1.016
Urobilinogen, UA: 0.2

## 2011-04-26 LAB — LIPID PANEL
LDL Cholesterol: 124 — ABNORMAL HIGH
VLDL: 74 — ABNORMAL HIGH

## 2011-04-26 LAB — CBC
HCT: 41.7
Hemoglobin: 14.3
MCHC: 34.4
RDW: 13

## 2011-04-26 LAB — COMPREHENSIVE METABOLIC PANEL
BUN: 14
Calcium: 9.6
Creatinine, Ser: 1.04
Glucose, Bld: 76
Total Protein: 6.8

## 2011-04-26 LAB — DIFFERENTIAL
Basophils Relative: 0
Lymphs Abs: 1.6
Monocytes Relative: 10
Neutro Abs: 6
Neutrophils Relative %: 69

## 2011-04-26 LAB — HEMOGLOBIN A1C
Hgb A1c MFr Bld: 6.5 — ABNORMAL HIGH
Mean Plasma Glucose: 154

## 2011-04-26 LAB — PROTIME-INR: INR: 0.9

## 2011-04-26 LAB — APTT: aPTT: 24

## 2011-04-30 ENCOUNTER — Encounter: Payer: Self-pay | Admitting: Internal Medicine

## 2011-04-30 LAB — POCT I-STAT 3, ART BLOOD GAS (G3+)
Acid-base deficit: 4 — ABNORMAL HIGH
O2 Saturation: 88
pCO2 arterial: 35.4

## 2011-04-30 LAB — GLUCOSE, CAPILLARY
Glucose-Capillary: 126 — ABNORMAL HIGH
Glucose-Capillary: 143 — ABNORMAL HIGH
Glucose-Capillary: 146 — ABNORMAL HIGH
Glucose-Capillary: 150 — ABNORMAL HIGH
Glucose-Capillary: 159 — ABNORMAL HIGH

## 2011-04-30 LAB — POCT I-STAT 3, VENOUS BLOOD GAS (G3P V)
Bicarbonate: 23
pCO2, Ven: 41.8 — ABNORMAL LOW
pCO2, Ven: 46
pH, Ven: 7.298
pH, Ven: 7.307 — ABNORMAL HIGH
pO2, Ven: 34

## 2011-04-30 LAB — CBC
MCHC: 33.6
Platelets: 183
RBC: 4.53
WBC: 6.4

## 2011-04-30 LAB — BASIC METABOLIC PANEL
BUN: 23
Calcium: 9.4
Creatinine, Ser: 1.3
GFR calc Af Amer: >60

## 2011-04-30 LAB — PROTIME-INR
INR: 1
Prothrombin Time: 13.2

## 2011-05-01 ENCOUNTER — Encounter: Payer: Self-pay | Admitting: Internal Medicine

## 2011-05-01 ENCOUNTER — Ambulatory Visit (INDEPENDENT_AMBULATORY_CARE_PROVIDER_SITE_OTHER): Payer: 59 | Admitting: Internal Medicine

## 2011-05-01 VITALS — BP 120/72 | HR 60 | Ht 69.0 in | Wt 202.4 lb

## 2011-05-01 DIAGNOSIS — Z95810 Presence of automatic (implantable) cardiac defibrillator: Secondary | ICD-10-CM

## 2011-05-01 DIAGNOSIS — I428 Other cardiomyopathies: Secondary | ICD-10-CM

## 2011-05-01 DIAGNOSIS — I5022 Chronic systolic (congestive) heart failure: Secondary | ICD-10-CM | POA: Insufficient documentation

## 2011-05-01 LAB — ICD DEVICE OBSERVATION
BRDY-0002RV: 40 {beats}/min
DEV-0020ICD: NEGATIVE
DEVICE MODEL ICD: 663191
FVT: 0
PACEART VT: 0
RV LEAD AMPLITUDE: 5.3 mv
RV LEAD IMPEDENCE ICD: 400 Ohm
TOT-0007: 1
TOT-0008: 0
TZAT-0001SLOWVT: 1
TZAT-0004FASTVT: 8
TZAT-0004SLOWVT: 8
TZAT-0012FASTVT: 200 ms
TZAT-0012SLOWVT: 200 ms
TZAT-0013FASTVT: 1
TZAT-0018FASTVT: NEGATIVE
TZAT-0018SLOWVT: NEGATIVE
TZON-0003FASTVT: 300 ms
TZON-0003SLOWVT: 340 ms
TZON-0004SLOWVT: 24
TZON-0010SLOWVT: 80 ms
TZST-0001FASTVT: 2
TZST-0001FASTVT: 3
TZST-0001SLOWVT: 3
TZST-0001SLOWVT: 5
TZST-0003FASTVT: 36 J
TZST-0003FASTVT: 36 J
TZST-0003FASTVT: 36 J
TZST-0003SLOWVT: 15 J
TZST-0003SLOWVT: 36 J
TZST-0003SLOWVT: 36 J
VENTRICULAR PACING ICD: 0 pct
VF: 0

## 2011-05-01 NOTE — Patient Instructions (Addendum)
Your physician wants you to follow-up in: 12 months with Dr Court Joy will receive a reminder letter in the mail two months in advance. If you don't receive a letter, please call our office to schedule the follow-up appointment.  Remote monitoring is used to monitor your Pacemaker of ICD from home. This monitoring reduces the number of office visits required to check your device to one time per year. It allows [place] to keep an eye on the functioning of your device to ensure it is working properly. You are scheduled for a device check from home on 08/02/2011 You may send your transmission at any time that day. If you have a wireless device, the transmission will be sent automatically. After your physician reviews your transmission, you will receive a postcard with your next transmission date.  Your physician has recommended you make the following change in your medication:  1) restart Lisinopril 10mg   1/2 of a 20mg  tablet

## 2011-05-01 NOTE — Assessment & Plan Note (Signed)
His symptoms are class I. He had previously discontinued his ACE inhibitor. I have asked him to restart lisinopril 10 mg daily. Continue a low-sodium diet and his other medical therapy.

## 2011-05-01 NOTE — Progress Notes (Signed)
HPI Mr. Dyar returns today for followup. He is a very pleasant 65 year old man with an ischemic cardiomyopathy, chronic systolic heart failure, hypertension, diabetes, and obesity. The patient is status post ICD implantation and has had no recurrent ICD shocks or therapies. He denies chest pain, peripheral edema, and his shortness of breath is much improved. He plays golf on a regular basis without limitation. No Known Allergies   Current Outpatient Prescriptions  Medication Sig Dispense Refill  . carvedilol (COREG) 6.25 MG tablet TAKE ONE TABLET BY MOUTH TWICE DAILY  60 tablet  3  . glipiZIDE (GLUCOTROL) 5 MG tablet TAKE ONE TABLET BY MOUTH TWICE DAILY  60 tablet  3  . lovastatin (MEVACOR) 20 MG tablet TAKE FOUR TABLETS BY MOUTH EVERY DAY  120 tablet  6  . metFORMIN (GLUCOPHAGE) 500 MG tablet TAKE ONE TABLET BY MOUTH TWICE DAILY  60 tablet  3  . NIASPAN 500 MG CR tablet TAKE ONE TABLET BY MOUTH AT BEDTIME  30 each  6  . Tamsulosin HCl (FLOMAX) 0.4 MG CAPS TAKE ONE CAPSULE BY MOUTH EVERY DAY IN THE EVENING  30 capsule  3  . lisinopril (PRINIVIL,ZESTRIL) 20 MG tablet Take 0.5 tablets (10 mg total) by mouth daily.  30 tablet  11     Past Medical History  Diagnosis Date  . Hypertension   . Dyslipidemia   . Diabetes mellitus   . CAD (coronary artery disease)   . Systolic dysfunction   . Vitamin D deficiency     ROS:   All systems reviewed and negative except as noted in the HPI.   Past Surgical History  Procedure Date  . Back surgery   . Left carpal tunnel release   . Left rotator cuff surgery      Family History  Problem Relation Age of Onset  . Heart attack Father 71     History   Social History  . Marital Status: Single    Spouse Name: N/A    Number of Children: 2  . Years of Education: N/A   Occupational History  . retired     Medical sales representative   Social History Main Topics  . Smoking status: Former Smoker    Types: Cigarettes    Quit date: 07/30/1984  .  Smokeless tobacco: Not on file  . Alcohol Use: No  . Drug Use: No  . Sexually Active: Not on file   Other Topics Concern  . Not on file   Social History Narrative  . No narrative on file     BP 120/72  Pulse 60  Ht 5\' 9"  (1.753 m)  Wt 202 lb 6.4 oz (91.808 kg)  BMI 29.89 kg/m2  Physical Exam:  Well appearing middle aged man, NAD HEENT: Unremarkable Neck:  No JVD, no thyromegally Lymphatics:  No adenopathy Back:  No CVA tenderness Lungs:  Clear with no wheezes, rales, or rhonchi. Well-healed ICD incision. HEART:  Regular rate rhythm, no murmurs, no rubs, no clicks Abd:  soft, positive bowel sounds, no organomegally, no rebound, no guarding Ext:  2 plus pulses, no edema, no cyanosis, no clubbing Skin:  No rashes no nodules Neuro:  CN II through XII intact, motor grossly intact  DEVICE  Normal device function.  See PaceArt for details.   Assess/Plan:

## 2011-05-01 NOTE — Assessment & Plan Note (Signed)
His device is working normally. We'll plan to recheck in several months. 

## 2011-05-02 LAB — CBC
HCT: 42.8
MCHC: 33.3
MCV: 88.5
Platelets: 191
RDW: 13.1

## 2011-05-02 LAB — CARDIAC PANEL(CRET KIN+CKTOT+MB+TROPI)
CK, MB: 2
CK, MB: 2.4
Relative Index: 1
Relative Index: 1
Total CK: 210
Total CK: 232
Total CK: 237 — ABNORMAL HIGH
Troponin I: 0.02
Troponin I: <0.01

## 2011-05-02 LAB — URINALYSIS, ROUTINE W REFLEX MICROSCOPIC
Glucose, UA: NEGATIVE
Nitrite: NEGATIVE
Specific Gravity, Urine: 1.024
pH: 5

## 2011-05-02 LAB — APTT: aPTT: 25

## 2011-05-02 LAB — BASIC METABOLIC PANEL
BUN: 18
CO2: 26
Calcium: 8.8
Chloride: 102
Creatinine, Ser: 1.23
GFR calc Af Amer: >60
GFR calc non Af Amer: 60 — ABNORMAL LOW
Glucose, Bld: 134 — ABNORMAL HIGH
Potassium: 4.4
Sodium: 136

## 2011-05-02 LAB — URINE MICROSCOPIC-ADD ON

## 2011-05-02 LAB — DIFFERENTIAL
Basophils Absolute: 0
Lymphocytes Relative: 23
Lymphs Abs: 1.5
Monocytes Absolute: 0.7
Neutro Abs: 4.2

## 2011-05-02 LAB — MAGNESIUM: Magnesium: 1.8

## 2011-05-02 LAB — GLUCOSE, CAPILLARY
Glucose-Capillary: 114 — ABNORMAL HIGH
Glucose-Capillary: 130 — ABNORMAL HIGH
Glucose-Capillary: 133 — ABNORMAL HIGH
Glucose-Capillary: 134 — ABNORMAL HIGH
Glucose-Capillary: 154 — ABNORMAL HIGH

## 2011-05-02 LAB — COMPREHENSIVE METABOLIC PANEL
Albumin: 4.3
BUN: 24 — ABNORMAL HIGH
Calcium: 10.1
Chloride: 105
Creatinine, Ser: 1.22
Total Bilirubin: 0.8
Total Protein: 6.8

## 2011-05-03 LAB — BASIC METABOLIC PANEL
BUN: 17 mg/dL (ref 6–23)
BUN: 18 mg/dL (ref 6–23)
Calcium: 8.4 mg/dL (ref 8.4–10.5)
Chloride: 103 mEq/L (ref 96–112)
Creatinine, Ser: 1.11 mg/dL (ref 0.4–1.5)
Creatinine, Ser: 1.25 mg/dL (ref 0.4–1.5)
GFR calc Af Amer: >60 mL/min (ref >60)
GFR calc non Af Amer: >60 mL/min (ref >60)
Potassium: 4.5 mEq/L (ref 3.5–5.1)

## 2011-05-03 LAB — CBC
Hemoglobin: 12.5 g/dL — ABNORMAL LOW (ref 13.0–17.0)
MCV: 88.7 fL (ref 78.0–100.0)
Platelets: 139 10*3/uL — ABNORMAL LOW (ref 150–400)
Platelets: 179 10*3/uL (ref 150–400)
RBC: 4.29 MIL/uL (ref 4.22–5.81)
RBC: 4.35 MIL/uL (ref 4.22–5.81)
WBC: 6.4 10*3/uL (ref 4.0–10.5)
WBC: 9.4 10*3/uL (ref 4.0–10.5)

## 2011-05-03 LAB — DIFFERENTIAL
Basophils Relative: 0 % (ref 0–1)
Basophils Relative: 1 % (ref 0–1)
Eosinophils Absolute: 0.2 10*3/uL (ref 0.0–0.7)
Eosinophils Absolute: 0.2 10*3/uL (ref 0.0–0.7)
Lymphocytes Relative: 13 % (ref 12–46)
Lymphs Abs: 1.2 10*3/uL (ref 0.7–4.0)
Lymphs Abs: 1.8 10*3/uL (ref 0.7–4.0)
Monocytes Relative: 10 % (ref 3–12)
Neutro Abs: 3.8 10*3/uL (ref 1.7–7.7)
Neutrophils Relative %: 58 % (ref 43–77)
Neutrophils Relative %: 60 % (ref 43–77)
Neutrophils Relative %: 81 % — ABNORMAL HIGH (ref 43–77)

## 2011-05-03 LAB — HEPATIC FUNCTION PANEL
ALT: 13 U/L (ref 0–53)
Alkaline Phosphatase: 38 U/L — ABNORMAL LOW (ref 39–117)
Bilirubin, Direct: 0.1 mg/dL (ref 0.0–0.3)
Indirect Bilirubin: 0.7 mg/dL (ref 0.3–0.9)
Total Bilirubin: 0.8 mg/dL (ref 0.3–1.2)
Total Protein: 5.9 g/dL — ABNORMAL LOW (ref 6.0–8.3)

## 2011-05-03 LAB — COMPREHENSIVE METABOLIC PANEL
ALT: 13 U/L (ref 0–53)
Alkaline Phosphatase: 33 U/L — ABNORMAL LOW (ref 39–117)
CO2: 25 mEq/L (ref 19–32)
GFR calc non Af Amer: 59 mL/min — ABNORMAL LOW (ref >60)
Glucose, Bld: 113 mg/dL — ABNORMAL HIGH (ref 70–99)
Potassium: 4.2 mEq/L (ref 3.5–5.1)
Sodium: 139 mEq/L (ref 135–145)
Total Bilirubin: 0.7 mg/dL (ref 0.3–1.2)

## 2011-05-03 LAB — LIPID PANEL
Cholesterol: 134 mg/dL (ref 0–200)
LDL Cholesterol: 54 mg/dL (ref 0–99)
Total CHOL/HDL Ratio: 5.2 RATIO
Triglycerides: 268 mg/dL — ABNORMAL HIGH (ref <150)

## 2011-05-03 LAB — GLUCOSE, CAPILLARY
Glucose-Capillary: 101 mg/dL — ABNORMAL HIGH (ref 70–99)
Glucose-Capillary: 107 mg/dL — ABNORMAL HIGH (ref 70–99)
Glucose-Capillary: 122 mg/dL — ABNORMAL HIGH (ref 70–99)
Glucose-Capillary: 132 mg/dL — ABNORMAL HIGH (ref 70–99)
Glucose-Capillary: 137 mg/dL — ABNORMAL HIGH (ref 70–99)
Glucose-Capillary: 82 mg/dL (ref 70–99)

## 2011-05-03 LAB — CULTURE, BLOOD (ROUTINE X 2): Culture: NO GROWTH

## 2011-05-03 LAB — APTT: aPTT: 21 seconds — ABNORMAL LOW (ref 24–37)

## 2011-06-19 ENCOUNTER — Ambulatory Visit (INDEPENDENT_AMBULATORY_CARE_PROVIDER_SITE_OTHER): Payer: Medicare Other | Admitting: Family Medicine

## 2011-06-19 ENCOUNTER — Encounter: Payer: Self-pay | Admitting: Family Medicine

## 2011-06-19 VITALS — BP 118/70 | HR 60 | Temp 97.8°F | Wt 202.0 lb

## 2011-06-19 DIAGNOSIS — I10 Essential (primary) hypertension: Secondary | ICD-10-CM

## 2011-06-19 DIAGNOSIS — E119 Type 2 diabetes mellitus without complications: Secondary | ICD-10-CM

## 2011-06-19 DIAGNOSIS — E785 Hyperlipidemia, unspecified: Secondary | ICD-10-CM

## 2011-06-19 LAB — LIPID PANEL
Total CHOL/HDL Ratio: 4
Triglycerides: 166 mg/dL — ABNORMAL HIGH (ref 0.0–149.0)

## 2011-06-19 LAB — BASIC METABOLIC PANEL
CO2: 27 mEq/L (ref 19–32)
Calcium: 9.4 mg/dL (ref 8.4–10.5)
Creatinine, Ser: 1.2 mg/dL (ref 0.4–1.5)

## 2011-06-19 LAB — HEPATIC FUNCTION PANEL
AST: 22 U/L (ref 0–37)
Albumin: 4.3 g/dL (ref 3.5–5.2)
Alkaline Phosphatase: 40 U/L (ref 39–117)

## 2011-06-19 MED ORDER — SILDENAFIL CITRATE 25 MG PO TABS: 25.0000 mg | ORAL_TABLET | Freq: Every day | ORAL | Status: AC | PRN

## 2011-06-19 NOTE — Patient Instructions (Signed)
Good to see you. Have a happy thanksgiving and enjoy the beach.

## 2011-06-19 NOTE — Progress Notes (Signed)
65 yo here to follow up.  HLD- On Mevacor 80 mg qhs and Niaspan 500 mg qhs.   Lab Results  Component Value Date   CHOL 141 05/18/2010   HDL 35.00* 05/18/2010   LDLCALC 72 05/18/2010   LDLDIRECT 79.6 02/14/2010   TRIG 171.0* 05/18/2010   CHOLHDL 4 05/18/2010   Denies any muscle aches.   DM-  Checks his CBGs 4-5 times weekly- ranging 110-150s.  Denies any episodes of hypoglycemia. On Glucotrol 5 mg twice daily and Metformin 500 mg twice daily  HTN- has been stable on Lisinoprl 10 mg daily and Coreg 6.25 twice daily Followed by Dr. Ladona Ridgel as well.  H/o MI with implantable defibrillator.  Lab Results  Component Value Date   HGBA1C 6.3 05/18/2010   Patient Active Problem List  Diagnoses  . DM  . UNSPECIFIED VITAMIN D DEFICIENCY  . HYPERLIPIDEMIA  . HYPERTENSION  . MYOCARDIAL INFARCTION  . IMPOTENCE OF ORGANIC ORIGIN  . HYPERSOMNIA UNSPECIFIED  . AUTOMATIC IMPLANTABLE CARDIAC DEFIBRILLATOR SITU  . Chronic systolic heart failure   Past Medical History  Diagnosis Date  . Hypertension   . Dyslipidemia   . Diabetes mellitus   . CAD (coronary artery disease)   . Systolic dysfunction   . Vitamin D deficiency    Past Surgical History  Procedure Date  . Back surgery   . Left carpal tunnel release   . Left rotator cuff surgery    History  Substance Use Topics  . Smoking status: Former Smoker    Types: Cigarettes    Quit date: 07/30/1984  . Smokeless tobacco: Not on file  . Alcohol Use: No   Family History  Problem Relation Age of Onset  . Heart attack Father 61   No Known Allergies Current Outpatient Prescriptions on File Prior to Visit  Medication Sig Dispense Refill  . carvedilol (COREG) 6.25 MG tablet TAKE ONE TABLET BY MOUTH TWICE DAILY  60 tablet  3  . glipiZIDE (GLUCOTROL) 5 MG tablet TAKE ONE TABLET BY MOUTH TWICE DAILY  60 tablet  3  . lisinopril (PRINIVIL,ZESTRIL) 20 MG tablet Take 0.5 tablets (10 mg total) by mouth daily.  30 tablet  11  . lovastatin  (MEVACOR) 20 MG tablet TAKE FOUR TABLETS BY MOUTH EVERY DAY  120 tablet  6  . metFORMIN (GLUCOPHAGE) 500 MG tablet TAKE ONE TABLET BY MOUTH TWICE DAILY  60 tablet  3  . NIASPAN 500 MG CR tablet TAKE ONE TABLET BY MOUTH AT BEDTIME  30 each  6  . Tamsulosin HCl (FLOMAX) 0.4 MG CAPS TAKE ONE CAPSULE BY MOUTH EVERY DAY IN THE EVENING  30 capsule  3   The PMH, PSH, Social History, Family History, Medications, and allergies have been reviewed in [facility], and have been updated if relevant.   ROS: See HPI Patient reports no  vision/ hearing changes,anorexia, weight change, fever ,adenopathy, persistant / recurrent hoarseness, swallowing issues, chest pain, edema,persistant / recurrent cough, hemoptysis, dyspnea(rest, exertional, paroxysmal nocturnal), gastrointestinal  bleeding (melena, rectal bleeding), abdominal pain, excessive heart burn, GU symptoms(dysuria, hematuria, pyuria, voiding/incontinence  Issues) syncope, focal weakness, severe memory loss, concerning skin lesions, depression, anxiety, abnormal bruising/bleeding, major joint swelling.     Physical Exam   BP 118/70  Pulse 60  Temp(Src) 97.8 F (36.6 C) (Oral)  Wt 202 lb (91.627 kg) Well appearing middle aged man, NAD  HEENT: Unremarkable  Neck: No JVD, no thyromegally  Lymphatics: No adenopathy  Back: No CVA tenderness  Lungs: Clear  with no wheezes, rales, or rhonchi. Well-healed ICD incision.  HEART: Regular rate rhythm, no murmurs, no rubs, no clicks  Abd: soft, positive bowel sounds, no organomegally, no rebound, no guarding  Ext: 2 plus pulses, no edema, no cyanosis, no clubbing  Skin: No rashes no nodules  Neuro: CN II through XII intact, motor grossly intact  Assessment and Plan: 1. DM  Deteriorated.  Recheck a1c today, will likely need to increase his metformin. The patient indicates understanding of these issues and agrees with the plan.  HgB A1c  2. HYPERLIPIDEMIA  Stable.  Recheck labs today. Continue current dose of  Mevacor and Niaspan. Lipid Profile, Hepatic function panel  3. HYPERTENSION  Stable on Lisinopril and Coreg. Basic Metabolic Panel (BMET)

## 2011-08-02 ENCOUNTER — Encounter: Payer: 59 | Admitting: *Deleted

## 2011-08-06 ENCOUNTER — Encounter: Payer: Self-pay | Admitting: *Deleted

## 2011-08-09 ENCOUNTER — Other Ambulatory Visit: Payer: Self-pay | Admitting: Family Medicine

## 2011-08-14 ENCOUNTER — Ambulatory Visit (INDEPENDENT_AMBULATORY_CARE_PROVIDER_SITE_OTHER): Payer: Medicare Other | Admitting: Family Medicine

## 2011-08-14 ENCOUNTER — Encounter: Payer: Self-pay | Admitting: Family Medicine

## 2011-08-14 VITALS — BP 122/70 | HR 68 | Temp 98.1°F | Wt 209.5 lb

## 2011-08-14 DIAGNOSIS — IMO0002 Reserved for concepts with insufficient information to code with codable children: Secondary | ICD-10-CM

## 2011-08-14 DIAGNOSIS — M7710 Lateral epicondylitis, unspecified elbow: Secondary | ICD-10-CM

## 2011-08-14 NOTE — Patient Instructions (Addendum)
You have lateral epicondylitis Try to avoid painful activities as much as possible. Ice the area 3-4 times a day for 15 minutes at a time. Tylenol or aleve as needed for pain. Counterforce brace as directed can help unload area - wear this regularly if it provides you with relief. Home Pronation/supination with 1 pound weight, wrist extension, stretching - do these once a day. Consider formal PT. Consider injection for short term pain relief if the above is not helping. Surgery is a consideration if exercises and injection are not providing enough relief.

## 2011-08-14 NOTE — Progress Notes (Signed)
SUBJECTIVE: Randy [name] is a 66 y.o. male who sustained a left elbow injury 8 day(s) ago. Mechanism of injury: playing golf. Immediate symptoms: immediate pain, no deformity was noted by the patient. Symptoms have been chronic since that time. Prior history of related problems: no prior problems with this area in the past.  Patient Active Problem List  Diagnoses  . DM  . UNSPECIFIED VITAMIN D DEFICIENCY  . HYPERLIPIDEMIA  . HYPERTENSION  . MYOCARDIAL INFARCTION  . IMPOTENCE OF ORGANIC ORIGIN  . HYPERSOMNIA UNSPECIFIED  . AUTOMATIC IMPLANTABLE CARDIAC DEFIBRILLATOR SITU  . Chronic systolic heart failure   Past Medical History  Diagnosis Date  . Hypertension   . Dyslipidemia   . Diabetes mellitus   . CAD (coronary artery disease)   . Systolic dysfunction   . Vitamin D deficiency    Past Surgical History  Procedure Date  . Back surgery   . Left carpal tunnel release   . Left rotator cuff surgery    History  Substance Use Topics  . Smoking status: Former Smoker    Types: Cigarettes    Quit date: 07/30/1984  . Smokeless tobacco: Not on file  . Alcohol Use: No   Family History  Problem Relation Age of Onset  . Heart attack Father 41   No Known Allergies Current Outpatient Prescriptions on File Prior to Visit  Medication Sig Dispense Refill  . carvedilol (COREG) 6.25 MG tablet TAKE ONE TABLET BY MOUTH TWICE DAILY  60 tablet  6  . glipiZIDE (GLUCOTROL) 5 MG tablet TAKE ONE TABLET BY MOUTH TWICE DAILY  60 tablet  6  . lisinopril (PRINIVIL,ZESTRIL) 20 MG tablet Take 0.5 tablets (10 mg total) by mouth daily.  30 tablet  11  . lovastatin (MEVACOR) 20 MG tablet TAKE FOUR TABLETS BY MOUTH EVERY DAY  120 tablet  6  . metFORMIN (GLUCOPHAGE) 500 MG tablet TAKE ONE TABLET BY MOUTH TWICE DAILY  60 tablet  6  . NIASPAN 500 MG CR tablet TAKE ONE TABLET BY MOUTH AT BEDTIME  30 each  6  . Tamsulosin HCl (FLOMAX) 0.4 MG CAPS TAKE ONE CAPSULE BY MOUTH EVERY DAY IN THE EVENING  30  capsule  60    The PMH, PSH, Social History, Family History, Medications, and allergies have been reviewed in [facility], and have been updated if relevant.  OBJECTIVE: BP 122/70  Pulse 68  Temp(Src) 98.1 F (36.7 C) (Oral)  Wt 209 lb 8 oz (95.029 kg)  Vital signs as noted above. Appearance: alert, well appearing, and in no distress. Elbow exam: lateral epicondylar tenderness. X-ray: not indicated.  ASSESSMENT: elbow lateral epicondylitis  PLAN: rest the injured area as much as practical, apply ice packs See pt instructions for further details. See orders for this visit as documented in the electronic medical record.

## 2011-09-10 ENCOUNTER — Other Ambulatory Visit: Payer: Self-pay | Admitting: Family Medicine

## 2011-09-12 ENCOUNTER — Telehealth: Payer: Self-pay | Admitting: Internal Medicine

## 2011-09-12 NOTE — Telephone Encounter (Signed)
09-12-11 lmm @ 420p for pt to send in home transmission, missed last one January 13/mt

## 2011-09-13 ENCOUNTER — Encounter: Payer: Self-pay | Admitting: Internal Medicine

## 2011-09-13 ENCOUNTER — Encounter: Payer: Medicare Other | Admitting: *Deleted

## 2011-09-13 DIAGNOSIS — I428 Other cardiomyopathies: Secondary | ICD-10-CM

## 2011-09-14 LAB — REMOTE ICD DEVICE
DEV-0020ICD: NEGATIVE
RV LEAD IMPEDENCE ICD: 440 Ohm
TZAT-0001SLOWVT: 1
TZAT-0004FASTVT: 8
TZAT-0004SLOWVT: 8
TZAT-0012FASTVT: 200 ms
TZAT-0012SLOWVT: 200 ms
TZAT-0013FASTVT: 1
TZAT-0018FASTVT: NEGATIVE
TZAT-0019SLOWVT: 7.5 V
TZON-0003FASTVT: 300 ms
TZON-0003SLOWVT: 340 ms
TZON-0004SLOWVT: 24
TZST-0001FASTVT: 3
TZST-0001SLOWVT: 3
TZST-0001SLOWVT: 5
TZST-0003FASTVT: 36 J
TZST-0003FASTVT: 36 J
TZST-0003SLOWVT: 15 J
TZST-0003SLOWVT: 36 J
TZST-0003SLOWVT: 36 J
VF: 1

## 2011-09-17 ENCOUNTER — Encounter: Payer: Self-pay | Admitting: *Deleted

## 2011-09-26 ENCOUNTER — Other Ambulatory Visit: Payer: Self-pay | Admitting: *Deleted

## 2011-09-26 ENCOUNTER — Encounter: Payer: Self-pay | Admitting: *Deleted

## 2011-09-26 DIAGNOSIS — I5022 Chronic systolic (congestive) heart failure: Secondary | ICD-10-CM

## 2011-09-26 MED ORDER — LISINOPRIL 20 MG PO TABS: 10.0000 mg | ORAL_TABLET | Freq: Every day | ORAL | Status: DC

## 2011-12-10 ENCOUNTER — Telehealth: Payer: Self-pay | Admitting: Internal Medicine

## 2011-12-10 NOTE — Telephone Encounter (Signed)
Please return call to patient [phone], he wants to discontinue the remote defib ck as he cannot afford it.

## 2011-12-10 NOTE — Telephone Encounter (Signed)
Spoke with wife she states his insurance doesn't pay for remote follow up.  He will call and schedule an in office check with the device clinic.

## 2011-12-13 ENCOUNTER — Ambulatory Visit (INDEPENDENT_AMBULATORY_CARE_PROVIDER_SITE_OTHER): Payer: Medicare Other | Admitting: Family Medicine

## 2011-12-13 ENCOUNTER — Ambulatory Visit (INDEPENDENT_AMBULATORY_CARE_PROVIDER_SITE_OTHER): Payer: Medicare Other | Admitting: *Deleted

## 2011-12-13 ENCOUNTER — Encounter: Payer: Self-pay | Admitting: Family Medicine

## 2011-12-13 ENCOUNTER — Encounter: Payer: Self-pay | Admitting: Internal Medicine

## 2011-12-13 VITALS — BP 120/72 | HR 72 | Temp 97.7°F | Wt 206.0 lb

## 2011-12-13 DIAGNOSIS — E119 Type 2 diabetes mellitus without complications: Secondary | ICD-10-CM

## 2011-12-13 DIAGNOSIS — I10 Essential (primary) hypertension: Secondary | ICD-10-CM

## 2011-12-13 DIAGNOSIS — I428 Other cardiomyopathies: Secondary | ICD-10-CM

## 2011-12-13 DIAGNOSIS — I5022 Chronic systolic (congestive) heart failure: Secondary | ICD-10-CM

## 2011-12-13 LAB — ICD DEVICE OBSERVATION
BATTERY VOLTAGE: 3.1133 V
CHARGE TIME: 10.6 s
DEV-0020ICD: NEGATIVE
HV IMPEDENCE: 73 Ohm
RV LEAD AMPLITUDE: 6.1 mv
RV LEAD IMPEDENCE ICD: 387.5 Ohm
RV LEAD THRESHOLD: 1.25 V
TOT-0008: 0
TOT-0009: 1
TOT-0010: 17
TZAT-0001FASTVT: 1
TZAT-0001SLOWVT: 1
TZAT-0004FASTVT: 8
TZAT-0004SLOWVT: 8
TZAT-0018FASTVT: NEGATIVE
TZAT-0019FASTVT: 7.5 V
TZAT-0020FASTVT: 1 ms
TZON-0003FASTVT: 300 ms
TZON-0003SLOWVT: 340 ms
TZON-0004FASTVT: 12
TZON-0005FASTVT: 6
TZON-0010FASTVT: 80 ms
TZON-0010SLOWVT: 80 ms
TZST-0001FASTVT: 3
TZST-0001FASTVT: 4
TZST-0001SLOWVT: 3
TZST-0001SLOWVT: 5
TZST-0003FASTVT: 25 J
TZST-0003SLOWVT: 15 J
TZST-0003SLOWVT: 36 J

## 2011-12-13 NOTE — Progress Notes (Signed)
66 yo here to follow up.  HLD- On Mevacor 80 mg qhs and Niaspan 500 mg qhs.   Lab Results  Component Value Date   CHOL 155 06/19/2011   HDL 43.60 06/19/2011   LDLCALC 78 06/19/2011   LDLDIRECT 79.6 02/14/2010   TRIG 166.0* 06/19/2011   CHOLHDL 4 06/19/2011   Denies any muscle aches.   DM-  Checks his CBGs 4-5 times weekly- ranging 130-160s.  Denies any episodes of hypoglycemia. On Glucotrol 5 mg twice daily and Metformin 500 mg twice daily  Lab Results  Component Value Date   HGBA1C 6.6* 06/19/2011     HTN- has been stable on Lisinoprl 10 mg daily and Coreg 6.25 twice daily Followed by Dr. Ladona Ridgel as well.  H/o MI with implantable defibrillator.  Lab Results  Component Value Date   CREATININE 1.2 06/19/2011     Patient Active Problem List  Diagnoses  . DM  . UNSPECIFIED VITAMIN D DEFICIENCY  . HYPERLIPIDEMIA  . HYPERTENSION  . MYOCARDIAL INFARCTION  . IMPOTENCE OF ORGANIC ORIGIN  . HYPERSOMNIA UNSPECIFIED  . AUTOMATIC IMPLANTABLE CARDIAC DEFIBRILLATOR SITU  . Chronic systolic heart failure   Past Medical History  Diagnosis Date  . Hypertension   . Dyslipidemia   . Diabetes mellitus   . CAD (coronary artery disease)   . Systolic dysfunction   . Vitamin d deficiency    Past Surgical History  Procedure Date  . Back surgery   . Left carpal tunnel release   . Left rotator cuff surgery    History  Substance Use Topics  . Smoking status: Former Smoker    Types: Cigarettes    Quit date: 07/30/1984  . Smokeless tobacco: Not on file  . Alcohol Use: No   Family History  Problem Relation Age of Onset  . Heart attack Father 54   No Known Allergies Current Outpatient Prescriptions on File Prior to Visit  Medication Sig Dispense Refill  . carvedilol (COREG) 6.25 MG tablet TAKE ONE TABLET BY MOUTH TWICE DAILY  60 tablet  6  . glipiZIDE (GLUCOTROL) 5 MG tablet TAKE ONE TABLET BY MOUTH TWICE DAILY  60 tablet  6  . lisinopril (PRINIVIL,ZESTRIL) 20 MG tablet Take  0.5 tablets (10 mg total) by mouth daily.  30 tablet  6  . lovastatin (MEVACOR) 20 MG tablet TAKE FOUR TABLETS BY MOUTH EVERY DAY  120 tablet  6  . metFORMIN (GLUCOPHAGE) 500 MG tablet TAKE ONE TABLET BY MOUTH TWICE DAILY  60 tablet  6  . NIASPAN 500 MG CR tablet TAKE ONE TABLET BY MOUTH AT BEDTIME  30 each  6  . Tamsulosin HCl (FLOMAX) 0.4 MG CAPS TAKE ONE CAPSULE BY MOUTH EVERY DAY IN THE EVENING  30 capsule  60   The PMH, PSH, Social History, Family History, Medications, and allergies have been reviewed in [facility], and have been updated if relevant.   ROS: See HPI No CP, no SOB. No fatigue     Physical Exam   BP 120/72  Pulse 72  Temp(Src) 97.7 F (36.5 C) (Oral)  Wt 206 lb (93.441 kg) Wt Readings from Last 3 Encounters:  12/13/11 206 lb (93.441 kg)  08/14/11 209 lb 8 oz (95.029 kg)  06/19/11 202 lb (91.627 kg)     Well appearing middle aged man, NAD  HEENT: Unremarkable  Neck: No JVD, no thyromegally  Lymphatics: No adenopathy  Back: No CVA tenderness  Lungs: Clear with no wheezes, rales, or rhonchi. Well-healed ICD incision.  HEART: Regular rate rhythm, no murmurs, no rubs, no clicks  Abd: soft, positive bowel sounds, no organomegally, no rebound, no guarding  Ext: 2 plus pulses, no edema, no cyanosis, no clubbing  Skin: No rashes no nodules  Neuro: CN II through XII intact, motor grossly intact  Assessment and Plan: 1. DM  a1c less than 7 in November.  Continue current meds, recheck a1c in 6 months.  .     2. HYPERLIPIDEMIA  Stable.  Continue current dose of Mevacor and Niaspan.   3. HYPERTENSION  Stable on Lisinopril and Coreg.

## 2011-12-13 NOTE — Progress Notes (Signed)
defib check in clinic  

## 2011-12-13 NOTE — Patient Instructions (Signed)
Great to see you. Please come back to see me in 6 months- we will recheck your a1c at that time.

## 2012-01-10 ENCOUNTER — Telehealth: Payer: Self-pay

## 2012-01-10 ENCOUNTER — Telehealth: Payer: Self-pay | Admitting: Family Medicine

## 2012-01-10 ENCOUNTER — Encounter: Payer: Self-pay | Admitting: Family Medicine

## 2012-01-10 ENCOUNTER — Encounter (HOSPITAL_COMMUNITY): Payer: Self-pay | Admitting: *Deleted

## 2012-01-10 ENCOUNTER — Inpatient Hospital Stay (HOSPITAL_COMMUNITY)
Admission: EM | Admit: 2012-01-10 | Discharge: 2012-01-12 | DRG: 683 | Disposition: A | Discharge status: Home / Self Care | Payer: Medicare Other | Source: Ambulatory Visit | Attending: Internal Medicine | Admitting: Internal Medicine

## 2012-01-10 ENCOUNTER — Ambulatory Visit (INDEPENDENT_AMBULATORY_CARE_PROVIDER_SITE_OTHER): Payer: Medicare Other | Admitting: Family Medicine

## 2012-01-10 ENCOUNTER — Telehealth: Payer: Self-pay | Admitting: *Deleted

## 2012-01-10 VITALS — BP 90/50 | HR 68 | Temp 97.8°F | Wt 193.0 lb

## 2012-01-10 DIAGNOSIS — I509 Heart failure, unspecified: Secondary | ICD-10-CM | POA: Diagnosis present

## 2012-01-10 DIAGNOSIS — E872 Acidosis, unspecified: Secondary | ICD-10-CM | POA: Diagnosis present

## 2012-01-10 DIAGNOSIS — N170 Acute kidney failure with tubular necrosis: Secondary | ICD-10-CM

## 2012-01-10 DIAGNOSIS — I219 Acute myocardial infarction, unspecified: Secondary | ICD-10-CM

## 2012-01-10 DIAGNOSIS — E860 Dehydration: Secondary | ICD-10-CM

## 2012-01-10 DIAGNOSIS — E119 Type 2 diabetes mellitus without complications: Secondary | ICD-10-CM | POA: Diagnosis present

## 2012-01-10 DIAGNOSIS — G4710 Hypersomnia, unspecified: Secondary | ICD-10-CM

## 2012-01-10 DIAGNOSIS — E1121 Type 2 diabetes mellitus with diabetic nephropathy: Secondary | ICD-10-CM | POA: Diagnosis present

## 2012-01-10 DIAGNOSIS — I5022 Chronic systolic (congestive) heart failure: Secondary | ICD-10-CM | POA: Diagnosis present

## 2012-01-10 DIAGNOSIS — N529 Male erectile dysfunction, unspecified: Secondary | ICD-10-CM

## 2012-01-10 DIAGNOSIS — Z79899 Other long term (current) drug therapy: Secondary | ICD-10-CM

## 2012-01-10 DIAGNOSIS — Z7982 Long term (current) use of aspirin: Secondary | ICD-10-CM

## 2012-01-10 DIAGNOSIS — R252 Cramp and spasm: Secondary | ICD-10-CM

## 2012-01-10 DIAGNOSIS — I214 Non-ST elevation (NSTEMI) myocardial infarction: Secondary | ICD-10-CM | POA: Diagnosis present

## 2012-01-10 DIAGNOSIS — E559 Vitamin D deficiency, unspecified: Secondary | ICD-10-CM | POA: Diagnosis present

## 2012-01-10 DIAGNOSIS — Z87891 Personal history of nicotine dependence: Secondary | ICD-10-CM

## 2012-01-10 DIAGNOSIS — T673XXA Heat exhaustion, anhydrotic, initial encounter: Secondary | ICD-10-CM | POA: Diagnosis present

## 2012-01-10 DIAGNOSIS — E785 Hyperlipidemia, unspecified: Secondary | ICD-10-CM

## 2012-01-10 DIAGNOSIS — I10 Essential (primary) hypertension: Secondary | ICD-10-CM

## 2012-01-10 DIAGNOSIS — I252 Old myocardial infarction: Secondary | ICD-10-CM

## 2012-01-10 DIAGNOSIS — N179 Acute kidney failure, unspecified: Principal | ICD-10-CM | POA: Diagnosis present

## 2012-01-10 DIAGNOSIS — Z95810 Presence of automatic (implantable) cardiac defibrillator: Secondary | ICD-10-CM

## 2012-01-10 LAB — CBC
MCH: 29.5 pg (ref 26.0–34.0)
MCV: 84.3 fL (ref 78.0–100.0)
Platelets: 157 10*3/uL (ref 150–400)
RDW: 13 % (ref 11.5–15.5)

## 2012-01-10 LAB — POCT URINALYSIS DIPSTICK
Blood, UA: NEGATIVE
Glucose, UA: NEGATIVE
Nitrite, UA: NEGATIVE
Spec Grav, UA: >=1.03
Urobilinogen, UA: NEGATIVE

## 2012-01-10 LAB — BASIC METABOLIC PANEL
CO2: 17 mEq/L — ABNORMAL LOW (ref 19–32)
Calcium: 9.3 mg/dL (ref 8.4–10.5)
Creatinine, Ser: 5.58 mg/dL — ABNORMAL HIGH (ref 0.50–1.35)
Glucose, Bld: 137 mg/dL — ABNORMAL HIGH (ref 70–99)

## 2012-01-10 LAB — COMPREHENSIVE METABOLIC PANEL
CO2: 21 mEq/L (ref 19–32)
Calcium: 9.2 mg/dL (ref 8.4–10.5)
Chloride: 99 mEq/L (ref 96–112)
Creatinine, Ser: 5.4 mg/dL (ref 0.4–1.5)
GFR: 11.39 mL/min — CL (ref >60.00)
Glucose, Bld: 190 mg/dL — ABNORMAL HIGH (ref 70–99)
Total Bilirubin: 0.9 mg/dL (ref 0.3–1.2)
Total Protein: 7.6 g/dL (ref 6.0–8.3)

## 2012-01-10 LAB — CK: Total CK: 893 U/L — ABNORMAL HIGH (ref 7–232)

## 2012-01-10 LAB — CK TOTAL AND CKMB (NOT AT ARMC): CK, MB: 7.4 ng/mL (ref 0.3–4.0)

## 2012-01-10 LAB — GLUCOSE, CAPILLARY: Glucose-Capillary: 100 mg/dL — ABNORMAL HIGH (ref 70–99)

## 2012-01-10 MED ORDER — ENOXAPARIN SODIUM 40 MG/0.4ML ~~LOC~~ SOLN: 40.0000 mg | SUBCUTANEOUS | Status: DC

## 2012-01-10 MED ORDER — HYDROCODONE-ACETAMINOPHEN 5-325 MG PO TABS
1.0000 | ORAL_TABLET | ORAL | Status: DC | PRN
  Filled 2012-01-10: qty 1

## 2012-01-10 MED ORDER — SODIUM CHLORIDE 0.9 % IV SOLN
1000.0000 mL | Freq: Once | INTRAVENOUS | Status: DC
  Administered 2012-01-10: 1000 mL via INTRAVENOUS

## 2012-01-10 MED ORDER — ASPIRIN 81 MG PO CHEW: 81.0000 mg | CHEWABLE_TABLET | Freq: Every day | ORAL | Status: DC

## 2012-01-10 MED ORDER — SODIUM CHLORIDE 0.9 % IV SOLN: 1000.0000 mL | INTRAVENOUS | Status: DC

## 2012-01-10 MED ORDER — TAMSULOSIN HCL 0.4 MG PO CAPS
0.4000 mg | ORAL_CAPSULE | Freq: Every day | ORAL | Status: DC
  Administered 2012-01-10 – 2012-01-11 (×2): 0.4 mg via ORAL
  Filled 2012-01-10 (×3): qty 1

## 2012-01-10 MED ORDER — ALUM & MAG HYDROXIDE-SIMETH 200-200-20 MG/5ML PO SUSP
30.0000 mL | Freq: Four times a day (QID) | ORAL | Status: DC | PRN
  Filled 2012-01-10: qty 30

## 2012-01-10 MED ORDER — ASPIRIN 81 MG PO TABS: 81.0000 mg | ORAL_TABLET | Freq: Every day | ORAL | Status: DC

## 2012-01-10 MED ORDER — ACETAMINOPHEN 325 MG PO TABS: 650.0000 mg | ORAL_TABLET | Freq: Four times a day (QID) | ORAL | Status: DC | PRN

## 2012-01-10 MED ORDER — CARVEDILOL 6.25 MG PO TABS
6.2500 mg | ORAL_TABLET | Freq: Two times a day (BID) | ORAL | Status: DC
  Filled 2012-01-10: qty 1

## 2012-01-10 MED ORDER — ENOXAPARIN SODIUM 30 MG/0.3ML ~~LOC~~ SOLN
30.0000 mg | SUBCUTANEOUS | Status: DC
  Administered 2012-01-11 (×2): 30 mg via SUBCUTANEOUS
  Filled 2012-01-10 (×3): qty 0.3

## 2012-01-10 MED ORDER — ACETAMINOPHEN 650 MG RE SUPP: 650.0000 mg | Freq: Four times a day (QID) | RECTAL | Status: DC | PRN

## 2012-01-10 MED ORDER — INSULIN ASPART 100 UNIT/ML ~~LOC~~ SOLN
0.0000 [IU] | Freq: Three times a day (TID) | SUBCUTANEOUS | Status: DC
  Administered 2012-01-11: 2 [IU] via SUBCUTANEOUS
  Administered 2012-01-12: 1 [IU] via SUBCUTANEOUS

## 2012-01-10 MED ORDER — SODIUM BICARBONATE 8.4 % IV SOLN
INTRAVENOUS | Status: DC
  Administered 2012-01-10: via INTRAVENOUS
  Filled 2012-01-10 (×2): qty 150

## 2012-01-10 NOTE — Telephone Encounter (Signed)
Pt was seen in office this morning

## 2012-01-10 NOTE — ED Notes (Signed)
Acuity increased due to lab results, CKMB noted to be critically high at 7.4

## 2012-01-10 NOTE — Telephone Encounter (Signed)
Randy [name]; pt worked in heat 01/09/12, had intermittent extremity cramping and dizziness yesterday; pt drinking fluids, voiding OK. Pt has diabetes and defibilator. 01/10/12 between 6 and 6:30 am had episode of legs and arms cramping x 1. No cramping since then, no chest pain or difficulty breathing.[name] disposition ED or seen in one hour in office. Pt did not want to go to ER. Appt with Dr Dayton Martes today at 9:45. Pt to arrive at 9:30 and if condition worsens or develops chest pain or difficulty breathing pt will go to ER. Pt verbalized understanding.

## 2012-01-10 NOTE — ED Provider Notes (Signed)
History     CSN: [id number] Arrival date & time 01/10/12  1339 First MD Initiated Contact with Patient 01/10/12 1745      Chief Complaint  Patient presents with  . Abnormal Lab    HPI Pt states he was working in a new house upstairs where it was hot yesterday.  He became overheated and dehydrated and started having muscle cramps.  No loc but he became dizzy.  Pt was trying to drink water but he still did not feel back to normal.  He saw his doctor today and had lab tests.  His doctor called him back and told him to come to the hospital.  He has been able to urinate today but not much.   He urine output is significantly decreased. The muscle cramps have stoppped.  Past Medical History  Diagnosis Date  . Hypertension   . Dyslipidemia   . Diabetes mellitus   . CAD (coronary artery disease)   . Systolic dysfunction   . Vitamin d deficiency     Past Surgical History  Procedure Date  . Back surgery   . Left carpal tunnel release   . Left rotator cuff surgery     Family History  Problem Relation Age of Onset  . Heart attack Father 57    History  Substance Use Topics  . Smoking status: Former Smoker    Types: Cigarettes    Quit date: 07/30/1984  . Smokeless tobacco: Not on file  . Alcohol Use: No      Review of Systems  All other systems reviewed and are negative.    Allergies  Review of patient's allergies indicates no known allergies.  Home Medications   Current Outpatient Rx  Name Route Sig Dispense Refill  . ASPIRIN 81 MG PO TABS Oral Take 81 mg by mouth daily.    Marland Kitchen CARVEDILOL 6.25 MG PO TABS  TAKE ONE TABLET BY MOUTH TWICE DAILY 60 tablet 6  . GLIPIZIDE 5 MG PO TABS  TAKE ONE TABLET BY MOUTH TWICE DAILY 60 tablet 6  . IBUPROFEN 200 MG PO TABS Oral Take 800 mg by mouth every 6 (six) hours as needed. For pain    . LISINOPRIL 20 MG PO TABS Oral Take 0.5 tablets (10 mg total) by mouth daily. 30 tablet 6  . LOVASTATIN 20 MG PO TABS  TAKE FOUR TABLETS BY MOUTH  EVERY DAY 120 tablet 6  . METFORMIN HCL 500 MG PO TABS  TAKE ONE TABLET BY MOUTH TWICE DAILY 60 tablet 6  . NIASPAN 500 MG PO TBCR  TAKE ONE TABLET BY MOUTH AT BEDTIME 30 each 6  . TAMSULOSIN HCL 0.4 MG PO CAPS  TAKE ONE CAPSULE BY MOUTH EVERY DAY IN THE EVENING 30 capsule 60    BP 99/59  Pulse 72  Temp 97.8 F (36.6 C) (Oral)  Resp 20  SpO2 95%  Physical Exam  Nursing note and vitals reviewed. Constitutional: He appears well-developed and well-nourished. No distress.  HENT:  Head: Normocephalic and atraumatic.  Right Ear: External ear normal.  Left Ear: External ear normal.  Eyes: Conjunctivae are normal. Right eye exhibits no discharge. Left eye exhibits no discharge. No scleral icterus.  Neck: Neck supple. No tracheal deviation present.  Cardiovascular: Normal rate, regular rhythm and intact distal pulses.   Pulmonary/Chest: Effort normal and breath sounds normal. No stridor. No respiratory distress. He has no wheezes. He has no rales.  Abdominal: Soft. Bowel sounds are normal. He exhibits no distension.  There is no tenderness. There is no rebound and no guarding.  Musculoskeletal: He exhibits no edema and no tenderness.  Neurological: He is alert. He has normal strength. No sensory deficit. Cranial nerve deficit:  no gross defecits noted. He exhibits normal muscle tone. He displays no seizure activity. Coordination normal.  Skin: Skin is warm and dry. No rash noted.  Psychiatric: He has a normal mood and affect.    ED Course  Procedures (including critical care time)  Labs Reviewed  BASIC METABOLIC PANEL - Abnormal; Notable for the following:    Sodium 131 (*)     Chloride 94 (*)     CO2 17 (*)     Glucose, Bld 137 (*)     BUN 73 (*)     Creatinine, Ser 5.58 (*)     GFR calc non Af Amer 10 (*)     GFR calc Af Amer 11 (*)     All other components within normal limits  CBC - Abnormal; Notable for the following:    Hemoglobin 12.6 (*)     HCT 36.0 (*)     All other  components within normal limits  CK - Abnormal; Notable for the following:    Total CK 1012 (*)     All other components within normal limits  CK TOTAL AND CKMB - Abnormal; Notable for the following:    Total CK 1002 (*)     CK, MB 7.4 (*)     All other components within normal limits  URINALYSIS, ROUTINE W REFLEX MICROSCOPIC   No results found.   1. Acute renal failure   2. Heat exhaustion due to water depletion       MDM  Patient presents with acute renal failure. This is most likely related to dehydration and heat exhaustion. He did not have any recent medication changes. Patient be admitted to the hospital. I have ordered IV fluids. We'll continue to monitor his electrolytes and vital signs. At this time he appears stable there doesn't appear to be any emergent need for dialysis.        Celene Kras, MD 01/10/12 [id number]

## 2012-01-10 NOTE — ED Notes (Signed)
Pt states went to doctors office for followup with cramping legs and arms, states that he was told kidneys were not functioning well and told to come to ED followup. Denies any complaints at this time.

## 2012-01-10 NOTE — Telephone Encounter (Signed)
Signed and returned to Laurie

## 2012-01-10 NOTE — Telephone Encounter (Signed)
Received critical lab results- Cr 5.4, BUN 69. Discussed with pt- advised going to ER immediately. Attempted to call [facility] triage nurse to inform them of his impending arrival- disconnected once and no answer second time.

## 2012-01-10 NOTE — Patient Instructions (Addendum)
I want you to drink gatorade, eat some food. I will call you immediately when I get your lab work back. Don't take your blood pressure medication today.

## 2012-01-10 NOTE — Telephone Encounter (Signed)
Form for diabetic supplies is on your desk.  I checked with the patient and he does get his supplies from this company.

## 2012-01-10 NOTE — Telephone Encounter (Signed)
Caller: Coby/Patient; PCP: [name] (Nestor Ramp); CB#: [phone]; ; ; Call regarding Muscle Cramps;   Patient states he developed intermittent muscle cramps in arms and legs. Onset 01/09/12. Patient states he was in the heat for a prolonged amount of time 01/09/12. Patient taking fluids well. States had intermittent dizziness 01/09/12. Denies headache or dizziness 01/10/12.  Patient last urinated @ 0600 01/10/12, yellow in color. Afebrile. States last episode of cramping 0630 01/10/12. Triage per Heat Exposure Protocol. Care advice given per guidelines. Patient advised increased fluids, change positions slowly. RN spoke with Artelia Laroche RN in office regarding ED Dispostion. No appts. available within 1 hour. Appt. scheduled for 01/10/12 0945 per Rena RN in office. Patient advised to be at office at 0930 01/10/12.  Patient advised not to drive self. Patient verbalizes understanding.

## 2012-01-10 NOTE — Progress Notes (Signed)
Subjective:    Patient ID: Randy [name], male    DOB: [birth year], 66 y.o.   MRN: [id number]  HPI  66 yo here for arms and leg cramps since yesterday.  Was outside in hot, 90 degree weather all day.  Did not eat or drink anything after breakfast. Last night, terrible arm and leg cramps. No CP or SOB- has defibrillator. No n/v/d.  CBGs have been good.  Patient Active Problem List  Diagnosis  . DM  . UNSPECIFIED VITAMIN D DEFICIENCY  . HYPERLIPIDEMIA  . HYPERTENSION  . MYOCARDIAL INFARCTION  . IMPOTENCE OF ORGANIC ORIGIN  . HYPERSOMNIA UNSPECIFIED  . AUTOMATIC IMPLANTABLE CARDIAC DEFIBRILLATOR SITU  . Chronic systolic heart failure  . Muscle cramps  . Dehydration   Past Medical History  Diagnosis Date  . Hypertension   . Dyslipidemia   . Diabetes mellitus   . CAD (coronary artery disease)   . Systolic dysfunction   . Vitamin d deficiency    Past Surgical History  Procedure Date  . Back surgery   . Left carpal tunnel release   . Left rotator cuff surgery    History  Substance Use Topics  . Smoking status: Former Smoker    Types: Cigarettes    Quit date: 07/30/1984  . Smokeless tobacco: Not on file  . Alcohol Use: No   Family History  Problem Relation Age of Onset  . Heart attack Father 16   No Known Allergies Current Outpatient Prescriptions on File Prior to Visit  Medication Sig Dispense Refill  . carvedilol (COREG) 6.25 MG tablet TAKE ONE TABLET BY MOUTH TWICE DAILY  60 tablet  6  . glipiZIDE (GLUCOTROL) 5 MG tablet TAKE ONE TABLET BY MOUTH TWICE DAILY  60 tablet  6  . lisinopril (PRINIVIL,ZESTRIL) 20 MG tablet Take 0.5 tablets (10 mg total) by mouth daily.  30 tablet  6  . lovastatin (MEVACOR) 20 MG tablet TAKE FOUR TABLETS BY MOUTH EVERY DAY  120 tablet  6  . metFORMIN (GLUCOPHAGE) 500 MG tablet TAKE ONE TABLET BY MOUTH TWICE DAILY  60 tablet  6  . NIASPAN 500 MG CR tablet TAKE ONE TABLET BY MOUTH AT BEDTIME  30 each  6  . Tamsulosin HCl (FLOMAX)  0.4 MG CAPS TAKE ONE CAPSULE BY MOUTH EVERY DAY IN THE EVENING  30 capsule  60   The PMH, PSH, Social History, Family History, Medications, and allergies have been reviewed in [facility], and have been updated if relevant.   Review of Systems See HPI    Objective:   Physical Exam BP 90/50  Pulse 68  Temp 97.8 F (36.6 C)  Wt 193 lb (87.544 kg) General:  overweght male in NAD Eyes:  PERRL Ears:  External ear exam shows no significant lesions or deformities.  Otoscopic examination reveals clear canals, tympanic membranes are intact bilaterally without bulging, retraction, inflammation or discharge. Hearing is grossly normal bilaterally. Nose:  External nasal examination shows no deformity or inflammation. Nasal mucosa are pink and moist without lesions or exudates. Mouth:  Oral mucosa and oropharynx without lesions or exudates.  Teeth in good repair. Neck:  no carotid bruit or thyromegaly no cervical or supraclavicular lymphadenopathy  Lungs:  Normal respiratory effort, chest expands symmetrically. Lungs are clear to auscultation, no crackles or wheezes. Heart:  Normal rate and regular rhythm. S1 and S2 normal without gallop, murmur, click, rub or other extra sounds. Pulses:  R and L posterior tibial pulses are full and equal bilaterally  Extremities:  no edema     Assessment & Plan:   1. Muscle cramps  New- resolved. Likely secondary to dehydration. Will check CK to rule out rhabdo. See below.  CK, POCT urinalysis dipstick  2. Dehydration  UA consistent with dehydration. Advised hydration.  Check labs as entered above. Comprehensive metabolic panel, POCT urinalysis dipstick

## 2012-01-10 NOTE — Telephone Encounter (Signed)
Form faxed, copy placed up front for scanning. 

## 2012-01-10 NOTE — H&P (Signed)
[facility] History and Physical  CAPRI RABEN ZOX:[id number] DOB: July 31, 1945 DOA: 01/10/2012   PCP: [name], MD   Chief Complaint:  Chief Complaint  Patient presents with  . Abnormal Lab     HPI:  66 yo man with DM, CHF on acei presented to PCP today with severe [place] muscular  cramping and back pain after doing extensive work in the heat 24 hrs prior to admit. Found to have ARF and referred for admit. No fever no chills No recent ilnesses No dyspnea No chest pain   Review of Systems:  As per HPI all other systems reviewed and negative  Past Medical History  Diagnosis Date  . Hypertension   . Dyslipidemia   . Diabetes mellitus   . CAD (coronary artery disease)   . Systolic dysfunction   . Vitamin d deficiency    Past Surgical History  Procedure Date  . Back surgery   . Left carpal tunnel release   . Left rotator cuff surgery    Social History:  reports that he quit smoking about 27 years ago. His smoking use included Cigarettes. He does not have any smokeless tobacco history on file. He reports that he does not drink alcohol or use illicit drugs.  No Known Allergies  Family History  Problem Relation Age of Onset  . Heart attack Father 46  . Diabetes Brother     Prior to Admission medications   Medication Sig Start Date End Date Taking? Authorizing Provider  aspirin 81 MG tablet Take 81 mg by mouth daily.   Yes Historical Provider, MD  carvedilol (COREG) 6.25 MG tablet TAKE ONE TABLET BY MOUTH TWICE DAILY 08/09/11  Yes Dianne Dun, MD  glipiZIDE (GLUCOTROL) 5 MG tablet TAKE ONE TABLET BY MOUTH TWICE DAILY 08/09/11  Yes Dianne Dun, MD  ibuprofen (ADVIL,MOTRIN) 200 MG tablet Take 800 mg by mouth every 6 (six) hours as needed. For pain   Yes Historical Provider, MD  lisinopril (PRINIVIL,ZESTRIL) 20 MG tablet Take 0.5 tablets (10 mg total) by mouth daily. 09/26/11  Yes Dianne Dun, MD  lovastatin (MEVACOR) 20 MG tablet TAKE FOUR TABLETS BY MOUTH  EVERY DAY 08/09/11  Yes Dianne Dun, MD  metFORMIN (GLUCOPHAGE) 500 MG tablet TAKE ONE TABLET BY MOUTH TWICE DAILY 08/09/11  Yes Dianne Dun, MD  NIASPAN 500 MG CR tablet TAKE ONE TABLET BY MOUTH AT BEDTIME 09/10/11  Yes Dianne Dun, MD  Tamsulosin HCl (FLOMAX) 0.4 MG CAPS TAKE ONE CAPSULE BY MOUTH EVERY DAY IN THE EVENING 08/09/11  Yes Dianne Dun, MD   Physical Exam: Filed Vitals:   01/10/12 1413  BP: 99/59  Pulse: 72  Temp: 97.8 F (36.6 C)  TempSrc: Oral  Resp: 20  SpO2: 95%     General:  axox3  Eyes: PERRLA, EOMI  ENT: clear pharynx, normal ears, nares  Neck: no JVD  Cardiovascular: RRR, no M,R, G  Respiratory: CTAB  Abdomen: soft, NT, BS present, no HSM  Skin: warm, dry, no rashes  Musculoskeletal: intact  Psychiatric: euthymic  Neurologic: CN 2-12 intCT,STRrength 5/5  Labs on Admission:  Basic Metabolic Panel:  Lab 01/10/12 8119 01/10/12 1130  NA 131* 135  K 4.3 4.2  CL 94* 99  CO2 17* 21  GLUCOSE 137* 190*  BUN 73* 69*  CREATININE 5.58* 5.4*  CALCIUM 9.3 9.2  MG -- --  PHOS -- --   Liver Function Tests:  Lab 01/10/12 1130  AST 28  ALT 18  ALKPHOS 40  BILITOT 0.9  PROT 7.6  ALBUMIN 4.6   No results found for this basename: LIPASE:5,AMYLASE:5 in the last 168 hours No results found for this basename: AMMONIA:5 in the last 168 hours CBC:  Lab 01/10/12 1445  WBC 6.5  NEUTROABS --  HGB 12.6*  HCT 36.0*  MCV 84.3  PLT 157   Cardiac Enzymes:  Lab 01/10/12 1451 01/10/12 1445 01/10/12 1130  CKTOTAL 1002* 1012* 893*  CKMB 7.4* -- --  CKMBINDEX -- -- --  TROPONINI -- -- --   BNP: No components found with this basename: POCBNP:5 CBG: No results found for this basename: GLUCAP:5 in the last 168 hours  Radiological Exams on Admission: No results found.  EKG: Independently reviewed. No ST, T changes  Assessment/Plan Principal Problem:  *Acute renal failure Active Problems:  DM  Unspecified vitamin D deficiency  MYOCARDIAL  INFARCTION  AUTOMATIC IMPLANTABLE CARDIAC DEFIBRILLATOR SITU  Chronic systolic heart failure  Muscle cramps  Dehydration  Metabolic acidosis   1. ARF - most likely due to dehydration in the setting of acei usage and mild rhabdo. For now we shall restore intravasc volume with iv fluids. Infuse also iv bicarbonate throught the night for the metabolic acidosis. DCed BB and acei for now. F/u UO and BMET closely 2. DM 2- ssi novolog for now 3. CHF s/p aicd  Code Status: full Family Communication: friend Disposition Plan: home  Markez Dowland, MD  [facility] Pager [phone]  If 7PM-7AM, please contact night-coverage [email or web address] Password [facility] 01/10/2012, 6:51 PM

## 2012-01-11 DIAGNOSIS — I509 Heart failure, unspecified: Secondary | ICD-10-CM

## 2012-01-11 DIAGNOSIS — E872 Acidosis: Secondary | ICD-10-CM

## 2012-01-11 DIAGNOSIS — N170 Acute kidney failure with tubular necrosis: Secondary | ICD-10-CM

## 2012-01-11 LAB — URINALYSIS, ROUTINE W REFLEX MICROSCOPIC
Ketones, ur: NEGATIVE mg/dL
Nitrite: NEGATIVE
Urobilinogen, UA: 0.2 mg/dL (ref 0.0–1.0)

## 2012-01-11 LAB — URINE MICROSCOPIC-ADD ON

## 2012-01-11 LAB — COMPREHENSIVE METABOLIC PANEL
AST: 20 U/L (ref 0–37)
Albumin: 3.9 g/dL (ref 3.5–5.2)
BUN: 71 mg/dL — ABNORMAL HIGH (ref 6–23)
Calcium: 8.8 mg/dL (ref 8.4–10.5)
Chloride: 101 mEq/L (ref 96–112)
Creatinine, Ser: 3.42 mg/dL — ABNORMAL HIGH (ref 0.50–1.35)
Total Bilirubin: 0.2 mg/dL — ABNORMAL LOW (ref 0.3–1.2)
Total Protein: 6.8 g/dL (ref 6.0–8.3)

## 2012-01-11 LAB — CBC
HCT: 34.6 % — ABNORMAL LOW (ref 39.0–52.0)
Hemoglobin: 11.8 g/dL — ABNORMAL LOW (ref 13.0–17.0)
MCH: 28.9 pg (ref 26.0–34.0)
MCV: 84.6 fL (ref 78.0–100.0)
Platelets: 128 10*3/uL — ABNORMAL LOW (ref 150–400)
RBC: 4.09 MIL/uL — ABNORMAL LOW (ref 4.22–5.81)
WBC: 4.8 10*3/uL (ref 4.0–10.5)

## 2012-01-11 LAB — GLUCOSE, CAPILLARY
Glucose-Capillary: 192 mg/dL — ABNORMAL HIGH (ref 70–99)
Glucose-Capillary: 108 mg/dL — ABNORMAL HIGH (ref 70–99)
Glucose-Capillary: 108 mg/dL — ABNORMAL HIGH (ref 70–99)

## 2012-01-11 MED ORDER — SODIUM CHLORIDE 0.9 % IV SOLN
INTRAVENOUS | Status: DC
  Administered 2012-01-11 – 2012-01-12 (×2): via INTRAVENOUS

## 2012-01-11 NOTE — Progress Notes (Addendum)
Pt had 7 beat run of V-tach on tele. Pt asymptomatic with no complaints of chest pain, pressure, or SOB. Dr. Lavera Guise text paged to make aware. Will continue to monitor. [place], Rosanna Randy

## 2012-01-11 NOTE — Progress Notes (Signed)
[facility] PROGRESS NOTE  Randy [name] ZOX:[id number] DOB: [birth year] DOA: 01/10/2012 PCP: [name], MD  Assessment/Plan:  1. ARF due to dehydration and ACEI . Even though the patient had some rhabdomyolysis. I do not think that he has pigment induced ATN. He is recovering well the renal function for now and has good urinary output. Will monitor closely to make sure he does not develop post ATN polyuria. C/w po and iv hydration  2. Metabolic acidosis - probably due to renal failure - had iv bicarbonate from 6/13 until 6/14 - improved now 3. Chronic systolic CHF stable   Principal Problem:  *Acute renal failure Active Problems:  DM  Unspecified vitamin D deficiency  MYOCARDIAL INFARCTION  AUTOMATIC IMPLANTABLE CARDIAC DEFIBRILLATOR SITU  Chronic systolic heart failure  Muscle cramps  Dehydration  Metabolic acidosis  Code Status: full Family Communication: wife Disposition Plan: home  Randy Koegel, MD  [facility] Pager [phone]  If 7PM-7AM, please contact night-coverage [email or web address] Password [facility] 01/11/2012, 11:41 AM   LOS: 1 day   Brief narrative: 65 yo man admitted for ARF  Consultants:  -  Procedures:  -  Antibiotics:  -  HPI/Subjective: Feels great  Objective: Filed Vitals:   01/10/12 1945 01/10/12 2026 01/11/12 0529 01/11/12 1000  BP: 121/60 119/48 91/50 106/68  Pulse: 70 74 66 72  Temp: 97.6 F (36.4 C) 97.7 F (36.5 C) 97.4 F (36.3 C) 97.4 F (36.3 C)  TempSrc: Oral  Oral Oral  Resp: 14 16 16 18   Weight:  87.5 kg (192 lb 14.4 oz)    SpO2: 97% 100% 100% 100%    Intake/Output Summary (Last 24 hours) at 01/11/12 1141 Last data filed at 01/11/12 0900  Gross per 24 hour  Intake    545 ml  Output   2250 ml  Net  -1705 ml    Exam:   General:  axox3  Cardiovascular: RRR  Respiratory: CTAB  Abdomen: soft, NT  Data Reviewed: Basic Metabolic Panel:  Lab 01/11/12 8295 01/10/12 1445 01/10/12 1130  NA  137 131* 135  K 3.9 4.3 4.2  CL 101 94* 99  CO2 19 17* 21  GLUCOSE 139* 137* 190*  BUN 71* 73* 69*  CREATININE 3.42* 5.58* 5.4*  CALCIUM 8.8 9.3 9.2  MG -- -- --  PHOS -- -- --   Liver Function Tests:  Lab 01/11/12 0630 01/10/12 1130  AST 20 28  ALT 15 18  ALKPHOS 41 40  BILITOT 0.2* 0.9  PROT 6.8 7.6  ALBUMIN 3.9 4.6   No results found for this basename: LIPASE:5,AMYLASE:5 in the last 168 hours No results found for this basename: AMMONIA:5 in the last 168 hours CBC:  Lab 01/11/12 0630 01/10/12 1445  WBC 4.8 6.5  NEUTROABS -- --  HGB 11.8* 12.6*  HCT 34.6* 36.0*  MCV 84.6 84.3  PLT 128* 157   Cardiac Enzymes:  Lab 01/10/12 1451 01/10/12 1445 01/10/12 1130  CKTOTAL 1002* 1012* 893*  CKMB 7.4* -- --  CKMBINDEX -- -- --  TROPONINI -- -- --   BNP (last 3 results) No results found for this basename: PROBNP:3 in the last 8760 hours CBG:  Lab 01/11/12 0816 01/10/12 1950  GLUCAP 192* 100*    No results found for this or any previous visit (from the past 240 hour(s)).   Studies: No results found.  Scheduled Meds:    . aspirin  81 mg Oral QHS  . enoxaparin (LOVENOX) injection  30 mg Subcutaneous Q24H  .  insulin aspart  0-9 Units Subcutaneous TID WC  . Tamsulosin HCl  0.4 mg Oral QPC supper  . DISCONTD: sodium chloride  1,000 mL Intravenous Once  . DISCONTD: sodium chloride  1,000 mL Intravenous Once  . DISCONTD: aspirin  81 mg Oral QHS  . DISCONTD: carvedilol  6.25 mg Oral BID WC  . DISCONTD: enoxaparin  40 mg Subcutaneous Q24H   Continuous Infusions:    . sodium chloride 50 mL/hr at 01/11/12 0856  . DISCONTD: sodium chloride    . DISCONTD:  sodium bicarbonate infusion 1000 mL 50 mL/hr at 01/10/12 2354

## 2012-01-12 DIAGNOSIS — N170 Acute kidney failure with tubular necrosis: Secondary | ICD-10-CM

## 2012-01-12 DIAGNOSIS — I509 Heart failure, unspecified: Secondary | ICD-10-CM

## 2012-01-12 DIAGNOSIS — E872 Acidosis: Secondary | ICD-10-CM

## 2012-01-12 LAB — BASIC METABOLIC PANEL
BUN: 43 mg/dL — ABNORMAL HIGH (ref 6–23)
Chloride: 106 mEq/L (ref 96–112)
GFR calc Af Amer: 68 mL/min — ABNORMAL LOW (ref >90)
GFR calc non Af Amer: 58 mL/min — ABNORMAL LOW (ref >90)
Glucose, Bld: 184 mg/dL — ABNORMAL HIGH (ref 70–99)
Potassium: 4.7 mEq/L (ref 3.5–5.1)
Sodium: 139 mEq/L (ref 135–145)

## 2012-01-12 MED ORDER — ACETAMINOPHEN 325 MG PO TABS: 650.0000 mg | ORAL_TABLET | Freq: Four times a day (QID) | ORAL | Status: DC | PRN

## 2012-01-12 NOTE — Discharge Summary (Signed)
Physician Discharge Summary  Randy [name] MVH:[id number] DOB: 11-05-1945 DOA: 01/10/2012  PCP: [name], MD  Admit date: 01/10/2012 Discharge date: 01/12/2012  Recommendations for Outpatient Follow-up:  1. Basic metabolic profile to assure that renal function has returned to normal 2. Blood pressure checks to see if he needs resumption of his antihypertensives 3. We held the beta blocker and ACE inhibitor  Discharge Diagnoses:  Acute renal failure - due to dehydration and and ACE inhibitors-resolved  DM  Unspecified vitamin D deficiency History of MYOCARDIAL INFARCTION  AUTOMATIC IMPLANTABLE CARDIAC DEFIBRILLATOR SITU  Chronic systolic heart failure  Muscle cramps - resolved  Dehydration - resolved  Metabolic acidosis - resolved  Discharge Condition: Good  Diet recommendation: Carb modified diet  History of present illness:  66 yo man with DM, CHF on acei presented to PCP today with severe muscular cramping and back pain after doing extensive work in the heat 24 hrs prior to admit. Found to have ARF and referred for admit.    Hospital Course:  1. ARF due to dehydration and ACEI . Even though the patient had some rhabdomyolysis, I do not think that he has pigment induced ATN. He is recovering well the renal function for now and has good urinary output. By the time of the discharge his creatinine has improved to 1.2 he will have to be monitored closely to make sure he does not develop post ATN polyuria. The patient will followup with his primary care physician 2. Metabolic acidosis - probably due to renal failure - had iv bicarbonate from 6/13 until 6/14 - improved by the time of discharge 3. Chronic systolic CHF stable - we had to hold the ACE inhibitor and the beta blocker due to renal failure and relative hypotension. I suspect he will be able to resume those medications gradually after he recovers     Discharge Exam: Filed Vitals:   01/12/12 0900  BP: 116/77  Pulse: 74    Temp: 98 F (36.7 C)  Resp: 19   Filed Vitals:   01/11/12 1800 01/11/12 2128 01/12/12 0507 01/12/12 0900  BP: 114/59 104/65 97/45 116/77  Pulse: 53 68 66 74  Temp: 97.6 F (36.4 C) 97.8 F (36.6 C) 97.4 F (36.3 C) 98 F (36.7 C)  TempSrc: Oral Oral Oral Oral  Resp: 22 20 18 19   Height:  5\' 9"  (1.753 m)    Weight:  89.177 kg (196 lb 9.6 oz)    SpO2: 99% 97% 98% 97%   General: Alert oriented x3 Cardiovascular: Regular rate and rhythm Respiratory: Clear to auscultation  Discharge Instructions  Discharge Orders    Future Appointments: Provider: Department: Dept Phone: Center:   03/17/2012 11:00 AM [facility] [phone] [facility]     Future Orders Please Complete By Expires   Diet Carb Modified      Increase activity slowly        Medication List  As of 01/12/2012 10:30 AM   STOP taking these medications         carvedilol 6.25 MG tablet      ibuprofen 200 MG tablet      lisinopril 20 MG tablet         TAKE these medications         acetaminophen 325 MG tablet   Commonly known as: TYLENOL   Take 2 tablets (650 mg total) by mouth every 6 (six) hours as needed for pain.      aspirin 81 MG  tablet   Take 81 mg by mouth daily.      glipiZIDE 5 MG tablet   Commonly known as: GLUCOTROL   TAKE ONE TABLET BY MOUTH TWICE DAILY      lovastatin 20 MG tablet   Commonly known as: MEVACOR   TAKE FOUR TABLETS BY MOUTH EVERY DAY      metFORMIN 500 MG tablet   Commonly known as: GLUCOPHAGE   TAKE ONE TABLET BY MOUTH TWICE DAILY      NIASPAN 500 MG CR tablet   Generic drug: niacin   TAKE ONE TABLET BY MOUTH AT BEDTIME      Tamsulosin HCl 0.4 MG Caps   Commonly known as: FLOMAX   TAKE ONE CAPSULE BY MOUTH EVERY DAY IN THE EVENING           Follow-up Information    Schedule an appointment as soon as possible for a visit with [name], MD.   Contact information:   [address] [place] [address] [place] [address] [phone]           The results of significant diagnostics from this hospitalization (including imaging, microbiology, ancillary and laboratory) are listed below for reference.    Significant Diagnostic Studies: No results found.  Microbiology: No results found for this or any previous visit (from the past 240 hour(s)).   Labs: Basic Metabolic Panel:  Lab 01/12/12 9528 01/11/12 0630 01/10/12 1445 01/10/12 1130  NA 139 137 131* 135  K 4.7 3.9 4.3 4.2  CL 106 101 94* 99  CO2 24 19 17* 21  GLUCOSE 184* 139* 137* 190*  BUN 43* 71* 73* 69*  CREATININE 1.25 3.42* 5.58* 5.4*  CALCIUM 9.3 8.8 9.3 9.2  MG -- -- -- --  PHOS -- -- -- --   Liver Function Tests:  Lab 01/11/12 0630 01/10/12 1130  AST 20 28  ALT 15 18  ALKPHOS 41 40  BILITOT 0.2* 0.9  PROT 6.8 7.6  ALBUMIN 3.9 4.6   No results found for this basename: LIPASE:5,AMYLASE:5 in the last 168 hours No results found for this basename: AMMONIA:5 in the last 168 hours CBC:  Lab 01/11/12 0630 01/10/12 1445  WBC 4.8 6.5  NEUTROABS -- --  HGB 11.8* 12.6*  HCT 34.6* 36.0*  MCV 84.6 84.3  PLT 128* 157   Cardiac Enzymes:  Lab 01/10/12 1451 01/10/12 1445 01/10/12 1130  CKTOTAL 1002* 1012* 893*  CKMB 7.4* -- --  CKMBINDEX -- -- --  TROPONINI -- -- --   BNP: BNP (last 3 results) No results found for this basename: PROBNP:3 in the last 8760 hours CBG:  Lab 01/12/12 0754 01/11/12 2122 01/11/12 1708 01/11/12 1149 01/11/12 0816  GLUCAP 123* 115* 108* 108* 192*    Time coordinating discharge: 35 minutes  Signed:  Lonia Blood, MD  [facility] 01/12/2012, 10:30 AM

## 2012-01-14 ENCOUNTER — Other Ambulatory Visit (INDEPENDENT_AMBULATORY_CARE_PROVIDER_SITE_OTHER): Payer: Medicare Other

## 2012-01-14 DIAGNOSIS — R899 Unspecified abnormal finding in specimens from other organs, systems and tissues: Secondary | ICD-10-CM

## 2012-01-14 DIAGNOSIS — R6889 Other general symptoms and signs: Secondary | ICD-10-CM

## 2012-01-14 LAB — COMPREHENSIVE METABOLIC PANEL
ALT: 18 U/L (ref 0–53)
AST: 19 U/L (ref 0–37)
Albumin: 4.2 g/dL (ref 3.5–5.2)
BUN: 19 mg/dL (ref 6–23)
CO2: 26 mEq/L (ref 19–32)
Calcium: 9 mg/dL (ref 8.4–10.5)
Chloride: 107 mEq/L (ref 96–112)
GFR: 84.26 mL/min (ref >60.00)
Potassium: 5 mEq/L (ref 3.5–5.1)

## 2012-01-16 ENCOUNTER — Encounter: Payer: Self-pay | Admitting: Family Medicine

## 2012-01-16 ENCOUNTER — Ambulatory Visit (INDEPENDENT_AMBULATORY_CARE_PROVIDER_SITE_OTHER): Payer: Medicare Other | Admitting: Family Medicine

## 2012-01-16 VITALS — BP 108/82 | HR 68 | Temp 98.1°F

## 2012-01-16 DIAGNOSIS — N179 Acute kidney failure, unspecified: Secondary | ICD-10-CM

## 2012-01-16 DIAGNOSIS — E119 Type 2 diabetes mellitus without complications: Secondary | ICD-10-CM

## 2012-01-16 DIAGNOSIS — I10 Essential (primary) hypertension: Secondary | ICD-10-CM

## 2012-01-16 LAB — COMPREHENSIVE METABOLIC PANEL
AST: 24 U/L (ref 0–37)
Alkaline Phosphatase: 38 U/L — ABNORMAL LOW (ref 39–117)
BUN: 16 mg/dL (ref 6–23)
Glucose, Bld: 178 mg/dL — ABNORMAL HIGH (ref 70–99)
Total Bilirubin: 0.3 mg/dL (ref 0.3–1.2)

## 2012-01-16 LAB — CK: Total CK: 97 U/L (ref 7–232)

## 2012-01-16 MED ORDER — CARVEDILOL 6.25 MG PO TABS: ORAL_TABLET | ORAL | Status: DC

## 2012-01-16 NOTE — Progress Notes (Signed)
Subjective:    Patient ID: Randy [name], male    DOB: Feb 18, 1946, 66 y.o.   MRN: [id number]  HPI   66 yo here for hospital follow up.  Notes reviewed.  Admitted to [facility] 6/13-6/15/2013 for acute renal failure. He saw me in office on 6/13 with complaints of muscle cramping in heat and Cr was elevated to 5.4. We sent him to ER.  Had some mild rabdomylosis as well, Ck was elevated, last check was  Lab Results  Component Value Date   CKTOTAL 1002* 01/10/2012   Hospitalist felt ARF was due to dehydration while on ACEI without pigment induced ATN.  Given IVF rehydration. Cr at discharge was 1.2. Lab Results  Component Value Date   CREATININE 1.0 01/14/2012   Lisinopril and Carvedilol were stopped. He does have h/o chronic systolic CHF and defibrillator.  Symptoms have resolved, feels much better.  Patient Active Problem List  Diagnosis  . DM  . Unspecified vitamin D deficiency  . HYPERLIPIDEMIA  . HYPERTENSION  . MYOCARDIAL INFARCTION  . IMPOTENCE OF ORGANIC ORIGIN  . HYPERSOMNIA UNSPECIFIED  . AUTOMATIC IMPLANTABLE CARDIAC DEFIBRILLATOR SITU  . Chronic systolic heart failure  . Muscle cramps  . Dehydration  . Acute renal failure  . Metabolic acidosis   Past Medical History  Diagnosis Date  . Hypertension   . Dyslipidemia   . Diabetes mellitus   . CAD (coronary artery disease)   . Systolic dysfunction   . Vitamin d deficiency    Past Surgical History  Procedure Date  . Back surgery   . Left carpal tunnel release   . Left rotator cuff surgery    History  Substance Use Topics  . Smoking status: Former Smoker    Types: Cigarettes    Quit date: 07/30/1984  . Smokeless tobacco: Not on file  . Alcohol Use: No   Family History  Problem Relation Age of Onset  . Heart attack Father 96  . Diabetes Brother    No Known Allergies Current Outpatient Prescriptions on File Prior to Visit  Medication Sig Dispense Refill  . acetaminophen (TYLENOL) 325 MG tablet  Take 2 tablets (650 mg total) by mouth every 6 (six) hours as needed for pain.      Marland Kitchen aspirin 81 MG tablet Take 81 mg by mouth daily.      Marland Kitchen glipiZIDE (GLUCOTROL) 5 MG tablet TAKE ONE TABLET BY MOUTH TWICE DAILY  60 tablet  6  . lovastatin (MEVACOR) 20 MG tablet TAKE FOUR TABLETS BY MOUTH EVERY DAY  120 tablet  6  . metFORMIN (GLUCOPHAGE) 500 MG tablet TAKE ONE TABLET BY MOUTH TWICE DAILY  60 tablet  6  . NIASPAN 500 MG CR tablet TAKE ONE TABLET BY MOUTH AT BEDTIME  30 each  6  . Tamsulosin HCl (FLOMAX) 0.4 MG CAPS TAKE ONE CAPSULE BY MOUTH EVERY DAY IN THE EVENING  30 capsule  60   The PMH, PSH, Social History, Family History, Medications, and allergies have been reviewed in [facility], and have been updated if relevant.    Review of Systems    See HPI Objective:   Physical Exam BP 108/82  Pulse 68  Temp 98.1 F (36.7 C) General:  pleasant male in NAD Eyes:  PERRL Ears:  External ear exam shows no significant lesions or deformities.  Otoscopic examination reveals clear canals, tympanic membranes are intact bilaterally without bulging, retraction, inflammation or discharge. Hearing is grossly normal bilaterally. Nose:  External nasal examination shows  no deformity or inflammation. Nasal mucosa are pink and moist without lesions or exudates. Mouth:  Oral mucosa and oropharynx without lesions or exudates.  Teeth in good repair. Neck:  no carotid bruit or thyromegaly no cervical or supraclavicular lymphadenopathy  Lungs:  Normal respiratory effort, chest expands symmetrically. Lungs are clear to auscultation, no crackles or wheezes. Heart:  Normal rate and regular rhythm. S1 and S2 normal without gallop, murmur, click, rub or other extra sounds. Pulses:  R and L posterior tibial pulses are full and equal bilaterally  Extremities:  no edema     Assessment & Plan:   1. Acute renal failure  Resolved. Recheck CMET, CK today. Comprehensive metabolic panel, CK  2. HYPERTENSION  Stable off  lisinopril. Will d/c lisinopril.  Restart coreg. The patient indicates understanding of these issues and agrees with the plan.    3. DM  Hemoglobin A1c

## 2012-01-16 NOTE — Patient Instructions (Addendum)
Good to see you. Please restart the coreg. We will call you with your lab results.

## 2012-03-12 ENCOUNTER — Other Ambulatory Visit: Payer: Self-pay | Admitting: Family Medicine

## 2012-03-18 ENCOUNTER — Telehealth: Payer: Self-pay | Admitting: Internal Medicine

## 2012-03-18 NOTE — Telephone Encounter (Signed)
Spoke w/pt in regards to sending transmission. Transmission scheduled for 03-24-12 instead of 03-26-12 office check. Pt aware to hit white button x 2 since so close to date. Pt understands and will do so.

## 2012-03-18 NOTE — Telephone Encounter (Signed)
Please return call to patient [phone] regarding remote transmission.

## 2012-03-24 ENCOUNTER — Ambulatory Visit (INDEPENDENT_AMBULATORY_CARE_PROVIDER_SITE_OTHER): Payer: Medicare Other | Admitting: *Deleted

## 2012-03-24 ENCOUNTER — Encounter: Payer: Self-pay | Admitting: *Deleted

## 2012-03-24 DIAGNOSIS — Z95810 Presence of automatic (implantable) cardiac defibrillator: Secondary | ICD-10-CM

## 2012-03-24 DIAGNOSIS — I5022 Chronic systolic (congestive) heart failure: Secondary | ICD-10-CM

## 2012-03-24 LAB — REMOTE ICD DEVICE
DEV-0020ICD: NEGATIVE
HV IMPEDENCE: 68 Ohm
RV LEAD AMPLITUDE: 6.7 mv
TZAT-0001FASTVT: 1
TZAT-0001SLOWVT: 1
TZAT-0004FASTVT: 8
TZAT-0019FASTVT: 7.5 V
TZAT-0019SLOWVT: 7.5 V
TZAT-0020SLOWVT: 1 ms
TZON-0004FASTVT: 12
TZON-0004SLOWVT: 24
TZON-0005SLOWVT: 6
TZON-0010FASTVT: 80 ms
TZON-0010SLOWVT: 80 ms
TZST-0001FASTVT: 2
TZST-0001FASTVT: 3
TZST-0001FASTVT: 4
TZST-0001FASTVT: 5
TZST-0001SLOWVT: 2
TZST-0001SLOWVT: 4
TZST-0003FASTVT: 25 J
TZST-0003FASTVT: 36 J
TZST-0003SLOWVT: 15 J
TZST-0003SLOWVT: 36 J

## 2012-03-25 ENCOUNTER — Ambulatory Visit (INDEPENDENT_AMBULATORY_CARE_PROVIDER_SITE_OTHER): Payer: Medicare Other | Admitting: Family Medicine

## 2012-03-25 ENCOUNTER — Encounter: Payer: Self-pay | Admitting: Family Medicine

## 2012-03-25 VITALS — BP 140/72 | HR 64 | Temp 98.0°F | Wt 199.0 lb

## 2012-03-25 DIAGNOSIS — M549 Dorsalgia, unspecified: Secondary | ICD-10-CM

## 2012-03-25 LAB — POCT URINALYSIS DIPSTICK
Glucose, UA: NEGATIVE
Ketones, UA: NEGATIVE
Leukocytes, UA: NEGATIVE
Spec Grav, UA: 1.015
Urobilinogen, UA: NEGATIVE

## 2012-03-25 LAB — COMPREHENSIVE METABOLIC PANEL
ALT: 19 U/L (ref 0–53)
Albumin: 4.1 g/dL (ref 3.5–5.2)
Alkaline Phosphatase: 40 U/L (ref 39–117)
Glucose, Bld: 112 mg/dL — ABNORMAL HIGH (ref 70–99)
Potassium: 4.4 mEq/L (ref 3.5–5.1)
Sodium: 139 mEq/L (ref 135–145)
Total Bilirubin: 0.7 mg/dL (ref 0.3–1.2)
Total Protein: 7 g/dL (ref 6.0–8.3)

## 2012-03-25 MED ORDER — TRAMADOL HCL 50 MG PO TABS: 50.0000 mg | ORAL_TABLET | Freq: Three times a day (TID) | ORAL | Status: AC | PRN

## 2012-03-25 NOTE — Progress Notes (Signed)
Subjective:    Patient ID: Randy [name], male    DOB: 11/11/1945, 66 y.o.   MRN: [id number]  HPI   66 yo here for bilateral low back pain with intermittent right sided radiculopathy for past several days. No known trauma or injury.  H/o low back surgery years ago.  He has appt with Dr. Ophelia Charter (ortho) to rule out further back issue.  Here today because he has h/o ARF in 12/2011 and wanted to make sure this back pain wasn't due to a kidney problem. No dysuria. Has tried to remain hydrated while working outside. No muscle cramps. Tylenol and Ibuprofen not helping.  H/o ARF in 12/2011 due to dehydration.  He saw me in office on 6/13 with complaints of muscle cramping in heat and Cr was elevated to 5.4. We sent him to ER.  Had some mild rabdomylosis as well, Ck was elevated, last check was  Lab Results  Component Value Date   CKTOTAL 1002* 01/10/2012   Hospitalist felt ARF was due to dehydration while on ACEI without pigment induced ATN.  Given IVF rehydration.    Lab Results  Component Value Date   CREATININE 1.2 01/16/2012     Patient Active Problem List  Diagnosis  . DM  . Unspecified vitamin D deficiency  . HYPERLIPIDEMIA  . HYPERTENSION  . MYOCARDIAL INFARCTION  . IMPOTENCE OF ORGANIC ORIGIN  . HYPERSOMNIA UNSPECIFIED  . AUTOMATIC IMPLANTABLE CARDIAC DEFIBRILLATOR SITU  . Chronic systolic heart failure  . Muscle cramps  . Dehydration  . Acute renal failure  . Metabolic acidosis   Past Medical History  Diagnosis Date  . Hypertension   . Dyslipidemia   . Diabetes mellitus   . CAD (coronary artery disease)   . Systolic dysfunction   . Vitamin d deficiency    Past Surgical History  Procedure Date  . Back surgery   . Left carpal tunnel release   . Left rotator cuff surgery    History  Substance Use Topics  . Smoking status: Former Smoker    Types: Cigarettes    Quit date: 07/30/1984  . Smokeless tobacco: Not on file  . Alcohol Use: No   Family  History  Problem Relation Age of Onset  . Heart attack Father 81  . Diabetes Brother    No Known Allergies Current Outpatient Prescriptions on File Prior to Visit  Medication Sig Dispense Refill  . acetaminophen (TYLENOL) 325 MG tablet Take 2 tablets (650 mg total) by mouth every 6 (six) hours as needed for pain.      Marland Kitchen aspirin 81 MG tablet Take 81 mg by mouth daily.      Marland Kitchen glipiZIDE (GLUCOTROL) 5 MG tablet TAKE ONE TABLET BY MOUTH TWICE DAILY  60 tablet  6  . lovastatin (MEVACOR) 20 MG tablet TAKE FOUR TABLETS BY MOUTH EVERY DAY  120 tablet  6  . metFORMIN (GLUCOPHAGE) 500 MG tablet TAKE ONE TABLET BY MOUTH TWICE DAILY  60 tablet  6  . NIASPAN 500 MG CR tablet TAKE ONE TABLET BY MOUTH AT BEDTIME  30 each  6  . Tamsulosin HCl (FLOMAX) 0.4 MG CAPS TAKE ONE CAPSULE BY MOUTH EVERY DAY IN THE EVENING  30 capsule  60   The PMH, PSH, Social History, Family History, Medications, and allergies have been reviewed in [facility], and have been updated if relevant.    Review of Systems    See HPI Objective:   Physical Exam BP 140/72  Pulse 64  Temp 98 F (36.7 C)  Wt 199 lb (90.266 kg) General:  pleasant male in NAD Eyes:  PERRL Ears:  External ear exam shows no significant lesions or deformities.  Otoscopic examination reveals clear canals, tympanic membranes are intact bilaterally without bulging, retraction, inflammation or discharge. Hearing is grossly normal bilaterally. Nose:  External nasal examination shows no deformity or inflammation. Nasal mucosa are pink and moist without lesions or exudates. Mouth:  Oral mucosa and oropharynx without lesions or exudates.  Teeth in good repair. Neck:  no carotid bruit or thyromegaly no cervical or supraclavicular lymphadenopathy  Lungs:  Normal respiratory effort, chest expands symmetrically. Lungs are clear to auscultation, no crackles or wheezes. Heart:  Normal rate and regular rhythm. S1 and S2 normal without gallop, murmur, click, rub or other extra  sounds. MSK:  Back normal to inspection and palpation, pos Fabers left, neg SLR bilaterally, normal gait. Pulses:  R and L posterior tibial pulses are full and equal bilaterally  Extremities:  no edema     Assessment & Plan:   1. Back pain  POCT urinalysis dipstick, Comprehensive metabolic panel   New- likely MSK and agree that he needs ortho follow up given his history. Advised to keep appt. UA neg. Check CMET today. Tramadol given for pain. Advised taking tramadol with food and remaining well hydrated. The patient indicates understanding of these issues and agrees with the plan.

## 2012-03-25 NOTE — Patient Instructions (Addendum)
You urine looked good. We are checking your labs today and will call you with the results tomorrow. Try tramadol as directed. Keep your appointment with Dr. Ophelia Charter.

## 2012-04-02 ENCOUNTER — Encounter: Payer: Self-pay | Admitting: Internal Medicine

## 2012-05-07 ENCOUNTER — Other Ambulatory Visit (HOSPITAL_COMMUNITY): Payer: Self-pay | Admitting: Orthopaedic Surgery

## 2012-05-07 ENCOUNTER — Encounter (HOSPITAL_COMMUNITY): Payer: Self-pay | Admitting: Pharmacy Technician

## 2012-05-08 ENCOUNTER — Other Ambulatory Visit: Payer: Self-pay | Admitting: Family Medicine

## 2012-05-08 NOTE — Pre-Procedure Instructions (Signed)
20 Randy [name]  05/08/2012   Your procedure is scheduled on:  Monday, October 14th  Report to [facility] at 1200 PM.  Call this number if you have problems the morning of surgery: [phone]   Remember:   Do not eat food or drink:After Midnight.  Take these medicines the morning of surgery with A SIP OF WATER: coreg   Do not wear jewelry, make-up or nail polish.  Do not wear lotions, powders, or perfumes.  Do not shave 48 hours prior to surgery. Men may shave face and neck.  Do not bring valuables to the hospital.  Contacts, dentures or bridgework may not be worn into surgery.  Leave suitcase in the car. After surgery it may be brought to your room.  For patients admitted to the hospital, checkout time is 11:00 AM the day of discharge.   Patients discharged the day of surgery will not be allowed to drive home.   Special Instructions: Shower using CHG 2 nights before surgery and the night before surgery.  If you shower the day of surgery use CHG.  Use special wash - you have one bottle of CHG for all showers.  You should use approximately 1/3 of the bottle for each shower.   Please read over the following fact sheets that you were given: Pain Booklet, Coughing and Deep Breathing, Blood Transfusion Information, MRSA Information and Surgical Site Infection Prevention

## 2012-05-08 NOTE — H&P (Signed)
[facility]   A Division of [employer]   [address] [place], [place] [address] Telephone: [phone]  Fax: [phone]     PATIENT: Randy [name], Randy [name]   MR#: [id number]  DOB: [birth year]   Visit Date: 05/07/2012     CHIEF COMPLAINT:   Progress severe left hip pain with osteoarthritis.   The patient returns with progressive left hip pain, osteoarthritis and limp.  He has been treated with anti-inflammatories.  He has had intra-articular injection which gave him great relief for 2 weeks and then he had recurrence of pain in his groin down the anterior thigh radiating to his knee.  He has been ambulatory with a Trendelenburg gait.  He has used a walker in the past and has a cane.  He has used ibuprofen in the past without relief.     CURRENT MEDICATIONS:   He is on metformin 500 mg 1 p.o. b.i.d., Carvedilol 6.25 mg 1 p.o. b.i.d., lisinopril 10 mg daily, Niaspan 500 mg daily, lovastatin 80 mg once a day, tamsulosin 0.4 mg 1 daily and glipizide 5 mg 1 p.o. b.i.d.   PAST SURGICAL HISTORY:   Include rotator cuff repair on the left in 2009, cervical fusion 2009 with complete healing and a defibrillator pacemaker placement in 2009.     FAMILY HISTORY:   Positive for diabetes and hypertension.     SOCIAL HISTORY:   The patient is single, lives with his girlfriend.  Does not smoke or drink.   REVIEW OF SYSTEMS:   A 14-point review of systems positive for arthritis, diabetes, hypertension, heart disease, pacemaker with defibrillator, sleep apnea, rotator cuff surgery, previous lumbar fusion, cervical fusion and rotator cuff repair.     PHYSICAL EXAMINATION:  The patient is alert and oriented, WD, WN, NAD.   Height 5 feet 9 inches, weight 210.  Extraocular movements intact.  Good visual acuity.  Well-healed cervical incision and rotator cuff incision.  Good range of motion of his shoulders.  Lungs are clear.  Pulse is 72 and regular.  He has Heberden node.   He has right long finger eschar over the middle phalanx where he noted he had a blister.  He has intact distal pulses.  He has a 10 degree hip flexion contracture, 30 degrees external rotation of the left hip, zero degrees internal rotation.  The opposite right hip has 10 degrees internal rotation and 50 degrees external rotation.  No knee flexion contracture.  Decreased sensation light touch distal extremity secondary to some diabetic neuropathy.     The patient has no accessory muscle inspiratory effort.  Abdomen is soft and nontender.  No liver or spleen palpable enlargement.  Good quad strength.     ASSESSMENT:  Left hip osteoarthritis with loss of joint space, some subchondral sclerosis, marginal osteophytes inferiorly.  The opposite hip shows maintained joint space with minimal osteophyte formation.  Incidental L4-5 instrumentation with solid TLIF noted.  Lateral film shows a 15 degree angle between the ASIS and sciatic notch.     Left hip osteoarthritis, failed walking aids, anti-inflammatory, intra-articular injections, his symptoms have been present and progressive over 6 months.  He is having trouble with pain that wakes him up at night, pain with activities, not able to play golf, has problems when he goes to the store, leans on a grocery cart.     PLAN:  The patient would like to proceed with total hip arthroplasty.  We discussed anterior approach, postoperative rehab, potential  for leg length inequality, risks of infection, reoperation, femur fracture.  He would not need to go rehab, should be able to go home after postop day 2.  Plan is left total hip arthroplasty.  All questions answered.    For additional information please see handwritten notes, reports, orders and prescriptions in this chart.      Mark C. Ophelia Charter, M.D.    Auto-Authenticated by Veverly Fells. Ophelia Charter, M.D.

## 2012-05-09 ENCOUNTER — Encounter (HOSPITAL_COMMUNITY): Payer: Self-pay | Admitting: Vascular Surgery

## 2012-05-09 ENCOUNTER — Encounter (HOSPITAL_COMMUNITY)
Admission: RE | Admit: 2012-05-09 | Discharge: 2012-05-09 | Disposition: A | Discharge status: Home / Self Care | Payer: Medicare Other | Source: Ambulatory Visit | Attending: Orthopaedic Surgery | Admitting: Orthopaedic Surgery

## 2012-05-09 ENCOUNTER — Telehealth: Payer: Self-pay | Admitting: Internal Medicine

## 2012-05-09 ENCOUNTER — Encounter (HOSPITAL_COMMUNITY): Payer: Self-pay

## 2012-05-09 DIAGNOSIS — M169 Osteoarthritis of hip, unspecified: Secondary | ICD-10-CM | POA: Insufficient documentation

## 2012-05-09 DIAGNOSIS — Z01818 Encounter for other preprocedural examination: Secondary | ICD-10-CM | POA: Insufficient documentation

## 2012-05-09 DIAGNOSIS — Z01812 Encounter for preprocedural laboratory examination: Secondary | ICD-10-CM | POA: Insufficient documentation

## 2012-05-09 DIAGNOSIS — M1610 Unilateral primary osteoarthritis, unspecified hip: Secondary | ICD-10-CM | POA: Insufficient documentation

## 2012-05-09 HISTORY — DX: Sleep apnea, unspecified: G47.30

## 2012-05-09 HISTORY — DX: Presence of cardiac pacemaker: Z95.0

## 2012-05-09 HISTORY — DX: Gastro-esophageal reflux disease without esophagitis: K21.9

## 2012-05-09 HISTORY — DX: Shortness of breath: R06.02

## 2012-05-09 HISTORY — DX: Myoneural disorder, unspecified: G70.9

## 2012-05-09 HISTORY — DX: Unspecified osteoarthritis, unspecified site: M19.90

## 2012-05-09 LAB — URINALYSIS, ROUTINE W REFLEX MICROSCOPIC
Glucose, UA: NEGATIVE mg/dL
Leukocytes, UA: NEGATIVE
Protein, ur: NEGATIVE mg/dL
Specific Gravity, Urine: 1.01 (ref 1.005–1.030)
pH: 6 (ref 5.0–8.0)

## 2012-05-09 LAB — SURGICAL PCR SCREEN
MRSA, PCR: NEGATIVE
Staphylococcus aureus: NEGATIVE

## 2012-05-09 LAB — CBC
MCH: 29.5 pg (ref 26.0–34.0)
MCHC: 34.2 g/dL (ref 30.0–36.0)
Platelets: 151 10*3/uL (ref 150–400)
RDW: 13 % (ref 11.5–15.5)

## 2012-05-09 LAB — COMPREHENSIVE METABOLIC PANEL
ALT: 13 U/L (ref 0–53)
AST: 19 U/L (ref 0–37)
Albumin: 4.2 g/dL (ref 3.5–5.2)
Calcium: 9.8 mg/dL (ref 8.4–10.5)
GFR calc Af Amer: 83 mL/min — ABNORMAL LOW (ref >90)
Sodium: 140 mEq/L (ref 135–145)
Total Protein: 7.5 g/dL (ref 6.0–8.3)

## 2012-05-09 LAB — ABO/RH: ABO/RH(D): O NEG

## 2012-05-09 LAB — APTT: aPTT: 28 seconds (ref 24–37)

## 2012-05-09 LAB — TYPE AND SCREEN: ABO/RH(D): O NEG

## 2012-05-09 NOTE — Progress Notes (Signed)
Spoke with Kerry Fort /w [employer]- informed of pt. Surgery date, time & the planned procedure.

## 2012-05-09 NOTE — Telephone Encounter (Signed)
plz return call to [facility]- Dr. Ophelia Charter  [phone]  Patient to have total hip replacement on Monday 05/12/12, Dr. Ophelia Charter needs surgical clearance faxed to [phone]

## 2012-05-09 NOTE — Telephone Encounter (Signed)
Dr Ladona Ridgel will not clear  Has not been seen since last Oct  Please sch OV

## 2012-05-09 NOTE — Progress Notes (Signed)
Call to A. Zelenak,PAC, reported that pt. Has ICD, [employer].   Pt. Due for annual visit with Dr. Sharion Settler  at end of Oct. 2013.  Last stress test - before ICD placement. She will review the chart.

## 2012-05-09 NOTE — Pre-Procedure Instructions (Signed)
20 SAILOR HAUGHN  05/09/2012   Your procedure is scheduled on:  05/12/2012  Report to [facility] at 1200noon              Call this number if you have problems the morning of surgery: [phone]   Remember:   Do not eat food or drink liquid:After Midnight. On SUNDAY  Take these medicines the morning of surgery with A SIP OF WATER: CARVEDILOL   Do not wear jewelry, make-up or nail polish.  Do not wear lotions, powders, or perfumes. You may wear deodorant.  Do not shave 48 hours prior to surgery. Men may shave face and neck.  Do not bring valuables to the hospital.  Contacts, dentures or bridgework may not be worn into surgery.  Leave suitcase in the car. After surgery it may be brought to your room.  For patients admitted to the hospital, checkout time is 11:00 AM the day of discharge.   Patients discharged the day of surgery will not be allowed to drive home.  Name and phone number of your driver: /w friend  Special Instructions: Shower using CHG 2 nights before surgery and the night before surgery.  If you shower the day of surgery use CHG.  Use special wash - you have one bottle of CHG for all showers.  You should use approximately 1/3 of the bottle for each shower.   Please read over the following fact sheets that you were given: Pain Booklet, Coughing and Deep Breathing, Blood Transfusion Information, Total Joint Packet, MRSA Information and Surgical Site Infection Prevention

## 2012-05-09 NOTE — Consult Note (Addendum)
Anesthesia Chart Review:  Patient is a 66 year old male scheduled for left THA by Dr. Ophelia Charter on Monday, 05/12/12.  His PAT visit was on Friday, 05/09/13.  History includes former smoker, DM2, CAD/inferior MI, chronic systolic CHF, ischemic CM, s/p ICD, HTN, GERD, mild OSA by sleep study '09, dyslipidemia, prior back surgery, C4-6 fusion, acute renal failure in June 2013 due to dehydration and mild rabdomylosis.  (His Cr on 03/25/12 was 1.1.)  PCP is Dr. [name].  Cardiologist is Dr. Ladona Ridgel.  His last visit was in October 2012.  He denies any ICD shocks, use of Nitro, chest pain, lower extremity edema, or shortness of breath at rest.  He has chronic dyspnea with mild to moderate exertion which he feels is stable.  He is not particularly active.  Exam shows a left chest wall AICD, heart RRR, no murmur.  Left lung with intermittent inspiratory wheeze that improved with deep breathing, right lung overall clear.  No LE edema or carotid bruits noted.  EKG on 01/10/12 on SR, consideration anterior infarct, non-specific inferior T wave abnormality.  (V2 and V3 r wave progression is reversed, otherwise no significant change when compared to his EKG on 05/20/08.)    Cardiac cath on 05/14/08 showed: 1. Severe 2-vessel coronary artery disease as described above. (50% pLAD, 40% mLAD, 95% ostial LCx, 90% mid LCx with occluded marginal branches, 95% pRCA with total occlusion in the midsection and right to right collaterals with faint filling of the distal RCA bed.)  2. Severe left ventricular dysfunction, EF 25%.  3. Possible abdominal aortic aneurysm.  I have reviewed the films with Dr. Juanda Chance. He does have severe ostial left circumflex disease involving the ramus; however, the circumflex does not have any graftable targets. The ramus is a moderate to large vessel. The LAD just has moderate nonobstructive disease and the RCA is chronically occluded with total akinesis of the inferior wall. At this point, I do not think  he would benefit significantly from surgical revascularization. He is likely at high risk for sudden cardiac death and will need a referral for defibrillator. We will also get an abdominal ultrasound to further evaluate for abdominal aortic aneurysm (Dr. Gala Romney).  He is s/p a St. Jude ICD on 05/20/08.  I don't see evidence of an AAA ultrasound, but a lumbar CT on 11/20/07 showed atherosclerotic aortic calcification without aneurysmal dilation.  Echo on 04/19/08 showed: - The left ventricle was mildly dilated. Overall left ventricular systolic function was moderately decreased. Left ventricular ejection fraction was estimated , range being 30 %. There was akinesis with scarring of the entire posterior wall. There was akinesis of the entire inferior wall. There was mild focal basal septal hypertrophy.  By notes, he had a Myoview study, July 2009, ejection fraction 32%, large inferolateral infarct/scar, mild peri-infarct ischemia.  CXR on 05/09/12 Trachea is midline. Heart size normal. ICD lead tip projects over the right ventricle. Lungs are clear. No pleural fluid. No acute findings.  Labs noted.  I reviewed with Anesthesiologist Dr. Chaney Malling.  Due to patient's significant cardiac history Anesthesia is requesting Dr. Lubertha Basque input prior to this proceed.  Cheryl at Dr. Bonnita Hollow office was notified and will contact [facility].  Shonna Chock, PA-C 05/09/12 1004  Addendum: 05/09/12 1610 Cheryl spoke with Dr. Lubertha Basque nurse.  Patient will need to be seen by Dr. Ladona Ridgel for cardiac clearance.  Plan to cancel surgery for 05/12/12.

## 2012-05-12 ENCOUNTER — Inpatient Hospital Stay (HOSPITAL_COMMUNITY): Admission: RE | Admit: 2012-05-12 | Payer: Medicare Other | Source: Ambulatory Visit | Admitting: Orthopaedic Surgery

## 2012-05-12 SURGERY — ARTHROPLASTY, HIP, TOTAL, ANTERIOR APPROACH
Anesthesia: General | Site: Hip | Laterality: Left

## 2012-05-15 ENCOUNTER — Ambulatory Visit: Payer: Medicare Other

## 2012-05-15 ENCOUNTER — Telehealth: Payer: Self-pay | Admitting: *Deleted

## 2012-05-15 NOTE — Telephone Encounter (Signed)
Patient calling wanting a nurse visit for tetanus, I advised pt his last TD was 01/14/2008 and he doesn't need a tetanus

## 2012-05-21 ENCOUNTER — Encounter: Payer: Self-pay | Admitting: *Deleted

## 2012-05-21 DIAGNOSIS — Z95810 Presence of automatic (implantable) cardiac defibrillator: Secondary | ICD-10-CM | POA: Insufficient documentation

## 2012-05-29 ENCOUNTER — Encounter: Payer: Self-pay | Admitting: Internal Medicine

## 2012-05-29 ENCOUNTER — Ambulatory Visit (INDEPENDENT_AMBULATORY_CARE_PROVIDER_SITE_OTHER): Payer: Medicare Other | Admitting: Internal Medicine

## 2012-05-29 VITALS — BP 120/62 | HR 68 | Ht 69.0 in | Wt 195.0 lb

## 2012-05-29 DIAGNOSIS — Z95810 Presence of automatic (implantable) cardiac defibrillator: Secondary | ICD-10-CM

## 2012-05-29 DIAGNOSIS — I5022 Chronic systolic (congestive) heart failure: Secondary | ICD-10-CM

## 2012-05-29 DIAGNOSIS — Z01810 Encounter for preprocedural cardiovascular examination: Secondary | ICD-10-CM | POA: Insufficient documentation

## 2012-05-29 DIAGNOSIS — I10 Essential (primary) hypertension: Secondary | ICD-10-CM

## 2012-05-29 DIAGNOSIS — E7800 Pure hypercholesterolemia, unspecified: Secondary | ICD-10-CM

## 2012-05-29 LAB — ICD DEVICE OBSERVATION
BATTERY VOLTAGE: 3.023 V
DEV-0020ICD: NEGATIVE
HV IMPEDENCE: 71 Ohm
PACEART VT: 0
RV LEAD THRESHOLD: 1.5 V
TOT-0009: 1
TOT-0010: 19
TZAT-0001FASTVT: 1
TZAT-0001SLOWVT: 1
TZAT-0013SLOWVT: 3
TZAT-0019FASTVT: 7.5 V
TZAT-0019SLOWVT: 7.5 V
TZAT-0020FASTVT: 1 ms
TZAT-0020SLOWVT: 1 ms
TZON-0004FASTVT: 12
TZON-0005FASTVT: 6
TZON-0005SLOWVT: 6
TZON-0010FASTVT: 80 ms
TZST-0001FASTVT: 4
TZST-0001FASTVT: 5
TZST-0001SLOWVT: 2
TZST-0001SLOWVT: 4
TZST-0003FASTVT: 25 J
TZST-0003SLOWVT: 25 J

## 2012-05-29 NOTE — Assessment & Plan Note (Signed)
The patient is scheduled to undergo hip replacement surgery. It is very difficult to assess his cardiovascular risk. He is diabetic. He is sedentary. He has known cardiovascular disease, status post MI, and has undergone no perfusion study in many years. I've recommended a Lexi scan Myoview to be performed to further assess his cardiovascular risk. Exercise treadmill testing is not an option because the patient cannot walk. We'll try to schedule this in the next few days so as not to delay surgery.

## 2012-05-29 NOTE — Patient Instructions (Addendum)
Your physician wants you to follow-up in: 12 months with Dr Court Joy will receive a reminder letter in the mail two months in advance. If you don't receive a letter, please call our office to schedule the follow-up appointment.   Remote monitoring is used to monitor your Pacemaker of ICD from home. This monitoring reduces the number of office visits required to check your device to one time per year. It allows [place] to keep an eye on the functioning of your device to ensure it is working properly. You are scheduled for a device check from home on 08/31/12. You may send your transmission at any time that day. If you have a wireless device, the transmission will be sent automatically. After your physician reviews your transmission, you will receive a postcard with your next transmission date.  Your physician has requested that you have a lexiscan myoview. For further information please visit [email or web address] Please follow instruction sheet, as given.

## 2012-05-29 NOTE — Progress Notes (Signed)
HPI Randy [name] returns today for followup. He is a very pleasant 66 year old man with an ischemic cardiomyopathy, diabetes, and dyslipidemia. I saw him last a year ago. At that time he was doing very well. In the last for 6 months however he has developed increasing hip pain and has become quite sedentary. The patient has minimal dyspnea with exertion. He is pending hip replacement surgery. He denies peripheral edema. He has not undergone any sort of stress testing in many years. He is diabetic and has an impaired anginal warning system. He has become quite sedentary. No Known Allergies   Current Outpatient Prescriptions  Medication Sig Dispense Refill  . aspirin 81 MG tablet Take 81 mg by mouth daily.      . carvedilol (COREG) 6.25 MG tablet Take 6.25 mg by mouth 2 times daily at 12 noon and 4 pm.       . glipiZIDE (GLUCOTROL) 5 MG tablet Take 5 mg by mouth 2 (two) times daily before a meal.      . lisinopril (PRINIVIL,ZESTRIL) 20 MG tablet Take 20 mg by mouth as needed.      . lovastatin (MEVACOR) 20 MG tablet Take 80 mg by mouth at bedtime.      . metFORMIN (GLUCOPHAGE) 500 MG tablet Take 500 mg by mouth 2 (two) times daily with a meal.      . NIASPAN 500 MG CR tablet TAKE ONE TABLET BY MOUTH AT BEDTIME  30 tablet  6  . Tamsulosin HCl (FLOMAX) 0.4 MG CAPS Take 0.4 mg by mouth every evening.      . traMADol (ULTRAM) 50 MG tablet as needed.         Past Medical History  Diagnosis Date  . Hypertension   . Dyslipidemia   . Diabetes mellitus   . CAD (coronary artery disease)   . Systolic dysfunction   . Vitamin D deficiency   . ICD (implantable cardiac defibrillator) in place     2009-St. Jude device  . Shortness of breath   . Pacemaker   . Sleep apnea     started a sleep study but didn't completed, states PCP told him he has sleep  apnea, doesn't use CPAP  . GERD (gastroesophageal reflux disease)   . Neuromuscular disorder     neuropathy- in hands  . Arthritis     hip, hands-  Osteoarthritis      ROS:   All systems reviewed and negative except as noted in the HPI.   Past Surgical History  Procedure Date  . Left carpal tunnel release   . Left rotator cuff surgery   . Back surgery     lumbar & cervical fusion      Family History  Problem Relation Age of Onset  . Heart attack Father 43  . Diabetes Brother      History   Social History  . Marital Status: Single    Spouse Name: N/A    Number of Children: 2  . Years of Education: N/A   Occupational History  . retired     Medical sales representative   Social History Main Topics  . Smoking status: Former Smoker    Types: Cigarettes    Quit date: 07/30/1984  . Smokeless tobacco: Not on file  . Alcohol Use: No  . Drug Use: No  . Sexually Active: Not on file   Other Topics Concern  . Not on file   Social History Narrative   Lives with friend  BP 120/62  Pulse 68  Ht 5\' 9"  (1.753 m)  Wt 195 lb (88.451 kg)  BMI 28.80 kg/m2  SpO2 92%  Physical Exam:  Well appearing middle-aged man, NAD HEENT: Unremarkable Neck:  No JVD, no thyromegally Lungs:  Clear with no wheezes, rales, or rhonchi. HEART:  Regular rate rhythm, no murmurs, no rubs, no clicks Abd:  soft, positive bowel sounds, no organomegally, no rebound, no guarding Ext:  2 plus pulses, no edema, no cyanosis, no clubbing Skin:  No rashes no nodules Neuro:  CN II through XII intact, motor grossly intact  DEVICE  Normal device function.  See PaceArt for details.   Assess/Plan:

## 2012-05-29 NOTE — Assessment & Plan Note (Signed)
His heart failure symptoms are difficult to assess as he is quite sedentary. He can walk with modest hip pain. He does not have shortness of breath with slow walking. He does not undergo any additional strenuous activity. He will continue his current medical therapy, and maintain a low-sodium diet.

## 2012-05-29 NOTE — Assessment & Plan Note (Signed)
His St. Jude single chamber defibrillator is working normally. We'll plan to recheck in several months. 

## 2012-06-02 ENCOUNTER — Ambulatory Visit (INDEPENDENT_AMBULATORY_CARE_PROVIDER_SITE_OTHER): Payer: Medicare Other | Admitting: Family Medicine

## 2012-06-02 ENCOUNTER — Encounter: Payer: Self-pay | Admitting: Family Medicine

## 2012-06-02 VITALS — BP 100/64 | HR 72 | Temp 98.1°F | Wt 199.0 lb

## 2012-06-02 DIAGNOSIS — I10 Essential (primary) hypertension: Secondary | ICD-10-CM

## 2012-06-02 DIAGNOSIS — Z23 Encounter for immunization: Secondary | ICD-10-CM

## 2012-06-02 DIAGNOSIS — I5022 Chronic systolic (congestive) heart failure: Secondary | ICD-10-CM

## 2012-06-02 MED ORDER — LISINOPRIL 20 MG PO TABS: 20.0000 mg | ORAL_TABLET | ORAL | Status: DC | PRN

## 2012-06-02 NOTE — Addendum Note (Signed)
Addended by: Eliezer Bottom on: 06/02/2012 09:39 AM   Modules accepted: Orders

## 2012-06-02 NOTE — Progress Notes (Signed)
Subjective:    Patient ID: Randy [name], male    DOB: 1946/01/01, 66 y.o.   MRN: [id number]  HPI   66 yo here for follow up.  Was scheduled for left hip replacement but it was postponed for cardiac clearance . Dr. Ladona Ridgel has scheduled a stress test for him.  He is anxious to proceed with his hip replacement.  Pre op lab work reviewed- glucose and renal function improved!  He does want refills on his lisinopril and to have a flu shot today.    Patient Active Problem List  Diagnosis  . DM  . Unspecified vitamin D deficiency  . HYPERLIPIDEMIA  . HYPERTENSION  . MYOCARDIAL INFARCTION  . IMPOTENCE OF ORGANIC ORIGIN  . HYPERSOMNIA UNSPECIFIED  . AUTOMATIC IMPLANTABLE CARDIAC DEFIBRILLATOR SITU  . Chronic systolic heart failure  . Muscle cramps  . Dehydration  . Acute renal failure  . Metabolic acidosis   Past Medical History  Diagnosis Date  . Hypertension   . Dyslipidemia   . Diabetes mellitus   . CAD (coronary artery disease)   . Systolic dysfunction   . Vitamin d deficiency    Past Surgical History  Procedure Date  . Back surgery   . Left carpal tunnel release   . Left rotator cuff surgery    History  Substance Use Topics  . Smoking status: Former Smoker    Types: Cigarettes    Quit date: 07/30/1984  . Smokeless tobacco: Not on file  . Alcohol Use: No   Family History  Problem Relation Age of Onset  . Heart attack Father 55  . Diabetes Brother    No Known Allergies Current Outpatient Prescriptions on File Prior to Visit  Medication Sig Dispense Refill  . acetaminophen (TYLENOL) 325 MG tablet Take 2 tablets (650 mg total) by mouth every 6 (six) hours as needed for pain.      Marland Kitchen aspirin 81 MG tablet Take 81 mg by mouth daily.      Marland Kitchen glipiZIDE (GLUCOTROL) 5 MG tablet TAKE ONE TABLET BY MOUTH TWICE DAILY  60 tablet  6  . lovastatin (MEVACOR) 20 MG tablet TAKE FOUR TABLETS BY MOUTH EVERY DAY  120 tablet  6  . metFORMIN (GLUCOPHAGE) 500 MG tablet TAKE  ONE TABLET BY MOUTH TWICE DAILY  60 tablet  6  . NIASPAN 500 MG CR tablet TAKE ONE TABLET BY MOUTH AT BEDTIME  30 each  6  . Tamsulosin HCl (FLOMAX) 0.4 MG CAPS TAKE ONE CAPSULE BY MOUTH EVERY DAY IN THE EVENING  30 capsule  60   The PMH, PSH, Social History, Family History, Medications, and allergies have been reviewed in [facility], and have been updated if relevant.    Review of Systems    See HPI Objective:   Physical Exam BP 100/64  Pulse 72  Temp 98.1 F (36.7 C)  Wt 199 lb (90.266 kg) General:  pleasant male in NAD Eyes:  PERRL Ears:  External ear exam shows no significant lesions or deformities.  Otoscopic examination reveals clear canals, tympanic membranes are intact bilaterally without bulging, retraction, inflammation or discharge. Hearing is grossly normal bilaterally. Nose:  External nasal examination shows no deformity or inflammation. Nasal mucosa are pink and moist without lesions or exudates. Mouth:  Oral mucosa and oropharynx without lesions or exudates.  Teeth in good repair. Neck:  no carotid bruit or thyromegaly no cervical or supraclavicular lymphadenopathy  Lungs:  Normal respiratory effort, chest expands symmetrically. Lungs are clear to  auscultation, no crackles or wheezes. Heart:  Normal rate and regular rhythm. S1 and S2 normal without gallop, murmur, click, rub or other extra sounds. Pulses:  R and L posterior tibial pulses are full and equal bilaterally  Extremities:  no edema     Assessment & Plan:   1. Chronic systolic heart failure  Followed by cards. lisinopril (PRINIVIL,ZESTRIL) 20 MG tablet  2. HYPERTENSION  Stable.  Rx refilled.

## 2012-06-05 ENCOUNTER — Ambulatory Visit (HOSPITAL_COMMUNITY): Payer: Medicare Other | Attending: Internal Medicine | Admitting: Radiology

## 2012-06-05 VITALS — BP 106/67 | Ht 69.0 in | Wt 192.0 lb

## 2012-06-05 DIAGNOSIS — R0602 Shortness of breath: Secondary | ICD-10-CM

## 2012-06-05 DIAGNOSIS — Z8249 Family history of ischemic heart disease and other diseases of the circulatory system: Secondary | ICD-10-CM | POA: Insufficient documentation

## 2012-06-05 DIAGNOSIS — E119 Type 2 diabetes mellitus without complications: Secondary | ICD-10-CM | POA: Insufficient documentation

## 2012-06-05 DIAGNOSIS — I10 Essential (primary) hypertension: Secondary | ICD-10-CM

## 2012-06-05 DIAGNOSIS — I5022 Chronic systolic (congestive) heart failure: Secondary | ICD-10-CM

## 2012-06-05 DIAGNOSIS — R0989 Other specified symptoms and signs involving the circulatory and respiratory systems: Secondary | ICD-10-CM | POA: Insufficient documentation

## 2012-06-05 DIAGNOSIS — R0609 Other forms of dyspnea: Secondary | ICD-10-CM | POA: Insufficient documentation

## 2012-06-05 DIAGNOSIS — E7800 Pure hypercholesterolemia, unspecified: Secondary | ICD-10-CM

## 2012-06-05 DIAGNOSIS — R002 Palpitations: Secondary | ICD-10-CM | POA: Insufficient documentation

## 2012-06-05 MED ORDER — REGADENOSON 0.4 MG/5ML IV SOLN
0.4000 mg | Freq: Once | INTRAVENOUS | Status: AC
  Administered 2012-06-05: 0.4 mg via INTRAVENOUS

## 2012-06-05 MED ORDER — TECHNETIUM TC 99M SESTAMIBI GENERIC - CARDIOLITE
30.0000 | Freq: Once | INTRAVENOUS | Status: AC | PRN
  Administered 2012-06-05: 30 via INTRAVENOUS

## 2012-06-05 MED ORDER — TECHNETIUM TC 99M SESTAMIBI GENERIC - CARDIOLITE
10.0000 | Freq: Once | INTRAVENOUS | Status: AC | PRN
  Administered 2012-06-05: 10 via INTRAVENOUS

## 2012-06-05 NOTE — Progress Notes (Signed)
[facility] SITE 3 NUCLEAR MED [id number] [place] [address] [phone]  Cardiology Nuclear Med Study  Randy [name] is a 66 y.o. male     MRN : [id number]     DOB: May 17, 1946  Procedure Date: 06/05/2012  Nuclear Med Background Indication for Stress Test:  Evaluation for Ischemia, and Pending Surgical Clearance for  (L) Hip surgery on by Dr. Loraine Leriche yates History:  ICM, 2009: MI-Defibrillator-ECHO: EF: 30% inferior/porterior akinessis MPS: inferolateral infarct with peri-infarct ischemia towards apex EF: 32%  Cardiac Risk Factors: Family History - CAD, History of Smoking, Hypertension, Lipids and NIDDM  Symptoms:  DOE, Palpitations and SOB   Nuclear Pre-Procedure Caffeine/Decaff Intake:  None > 12 hrs NPO After: 4:00pm   Lungs:  clear O2 Sat: 95% on room air. IV 0.9% NS with Angio Cath:  20g  IV Site: R Antecubital x 1, tolerated well IV Started by:  Irean Hong, RN  Chest Size (in):  46 Cup Size: n/a  Height: 5\' 9"  (1.753 m)  Weight:  192 lb (87.091 kg)  BMI:  Body mass index is 28.35 kg/(m^2). Tech Comments:  FBS was 117 at 6:10am, no diabetic medication today.Patient took coreg this am    Nuclear Med Study 1 or 2 day study: 1 day  Stress Test Type:  Eugenie Birks  Reading MD: Dietrich Pates, MD  Order Authorizing Provider:  Lewayne Bunting, MD  Resting Radionuclide: Technetium 76m Sestamibi  Resting Radionuclide Dose: 11.0 mCi   Stress Radionuclide:  Technetium 5m Sestamibi  Stress Radionuclide Dose: 33.0 mCi           Stress Protocol Rest HR: 58 Stress HR: 83  Rest BP: 106/67 Stress BP: 116/71  Exercise Time (min): n/a METS: n/a   Predicted Max HR: 154 bpm % Max HR: 50.65 bpm Rate Pressure Product: 9048   Dose of Adenosine (mg):  n/a Dose of Lexiscan: 0.4 mg  Dose of Atropine (mg): n/a Dose of Dobutamine: n/a mcg/kg/min (at max HR)  Stress Test Technologist: Milana Na, EMT-P  Nuclear Technologist:  Domenic Polite, CNMT      Rest Procedure:  Myocardial perfusion imaging was performed at rest 45 minutes following the intravenous administration of Technetium 70m Sestamibi. Rest ECG: Sinus Bradycardia  Stress Procedure:  The patient received IV Lexiscan 0.4 mg over 15-seconds.  Technetium 93m Sestamibi injected at 30-seconds.  There were no significant changes, sob, lt.headed, and occ pvcs with Lexiscan.  Quantitative spect images were obtained after a 45 minute delay. Stress ECG: No significant change from baseline ECG  QPS Raw Data Images:  Soft tissue (diaphragm) underlies heart. Stress Images:  Large defect in the inferior (base, mid, distal) inferolateral (base, mid), inferoseptal (base, minimally mid) and apex)  Normal perfuson elsewhere. Rest Images:  No significant change from stress images. Subtraction (SDS):  Scar with no signif ischemia. Transient Ischemic Dilatation (Normal <1.22):  1.18 Lung/Heart Ratio (Normal <0.45):  0.39  Quantitative Gated Spect Images QGS EDV:  164 ml QGS ESV:  109 ml  Impression Exercise Capacity:  Lexiscan with no exercise. BP Response:  Normal blood pressure response. Clinical Symptoms:  No chest pain. ECG Impression:  No significant ST segment change suggestive of ischemia. Comparison with Prior Nuclear Study  No signif change from previous report.  Overall Impression:  large area of scar in the inferior,inferolateral inferoseptal and apical walls.  No significant ischemia.    LV Ejection Fraction: 34%.  LV Wall Motion:  Hypokinesis in the inferior,  inferolateral and inferoseptal walls.  Dietrich Pates

## 2012-06-06 ENCOUNTER — Telehealth: Payer: Self-pay | Admitting: *Deleted

## 2012-06-06 NOTE — Telephone Encounter (Signed)
Form for diabetic supplies is on your desk.  I checked with the patient and he does want these supplies. 

## 2012-06-09 NOTE — Telephone Encounter (Signed)
Completed and on my desk.

## 2012-06-09 NOTE — Telephone Encounter (Signed)
Completed form faxed as instructed. Form sent to be scanned into EMR.

## 2012-06-16 NOTE — Telephone Encounter (Signed)
Form faxed again to [employer].

## 2012-06-30 ENCOUNTER — Telehealth: Payer: Self-pay

## 2012-06-30 NOTE — Telephone Encounter (Signed)
Pt receiving new meter from a diabetic supply co. Has not gotten meter yet; does not know name of meter or name of diabetic co. Advised pt when received new meter to call back so we will know what diabetic strips and lancets to send to [facility].

## 2012-07-16 ENCOUNTER — Telehealth: Payer: Self-pay

## 2012-07-16 NOTE — Telephone Encounter (Signed)
Pt called back pt request free style freedom lite meter, free style lancets and free style lite test strips with diagnosis written rx faxed to [facility]. Pt test blood sugar once a day. Pt request quantity of # 100 for test strips and lancets.Please advise.

## 2012-07-16 NOTE — Telephone Encounter (Signed)
Ok to send as reqeusted.

## 2012-07-16 NOTE — Telephone Encounter (Signed)
Pt request new rx written and faxed to [facility] for glucose meter, test strips and lancets. Pt is not at home now and will call back with name of meter and how often pt test blood sugar.

## 2012-07-17 MED ORDER — FREESTYLE LANCETS MISC: Status: DC

## 2012-07-17 MED ORDER — FREESTYLE LITE DEVI: Status: DC

## 2012-07-17 MED ORDER — GLUCOSE BLOOD VI STRP: ORAL_STRIP | Status: DC

## 2012-07-17 NOTE — Telephone Encounter (Signed)
Supplies sent to [facility].

## 2012-09-01 ENCOUNTER — Telehealth: Payer: Self-pay | Admitting: Internal Medicine

## 2012-09-01 ENCOUNTER — Ambulatory Visit (INDEPENDENT_AMBULATORY_CARE_PROVIDER_SITE_OTHER): Payer: Medicare Other | Admitting: *Deleted

## 2012-09-01 ENCOUNTER — Encounter: Payer: Self-pay | Admitting: Internal Medicine

## 2012-09-01 DIAGNOSIS — I428 Other cardiomyopathies: Secondary | ICD-10-CM

## 2012-09-01 DIAGNOSIS — Z95810 Presence of automatic (implantable) cardiac defibrillator: Secondary | ICD-10-CM

## 2012-09-01 NOTE — Telephone Encounter (Signed)
lmom for patient to call me but that he had been cleared back in Nov by Dr Ladona Ridgel and I would be glad to sent the clearance again if needed

## 2012-09-01 NOTE — Telephone Encounter (Signed)
New problem:   Upcoming hip surgery was cancel.  Status of cardiac clearance.

## 2012-09-03 LAB — REMOTE ICD DEVICE
DEV-0020ICD: NEGATIVE
HV IMPEDENCE: 65 Ohm
RV LEAD IMPEDENCE ICD: 390 Ohm
TZAT-0001FASTVT: 1
TZAT-0001SLOWVT: 1
TZAT-0004SLOWVT: 8
TZAT-0012FASTVT: 200 ms
TZAT-0013FASTVT: 1
TZAT-0018FASTVT: NEGATIVE
TZAT-0019FASTVT: 7.5 V
TZAT-0019SLOWVT: 7.5 V
TZAT-0020FASTVT: 1 ms
TZON-0003FASTVT: 300 ms
TZON-0004FASTVT: 12
TZON-0004SLOWVT: 24
TZON-0005FASTVT: 6
TZON-0010SLOWVT: 80 ms
TZST-0001FASTVT: 5
TZST-0001SLOWVT: 3
TZST-0003FASTVT: 36 J
TZST-0003FASTVT: 36 J
TZST-0003SLOWVT: 15 J
TZST-0003SLOWVT: 36 J

## 2012-09-09 ENCOUNTER — Other Ambulatory Visit: Payer: Self-pay | Admitting: Family Medicine

## 2012-09-10 ENCOUNTER — Other Ambulatory Visit (HOSPITAL_COMMUNITY): Payer: Self-pay | Admitting: Orthopaedic Surgery

## 2012-09-10 ENCOUNTER — Telehealth: Payer: Self-pay | Admitting: Internal Medicine

## 2012-09-10 NOTE — Telephone Encounter (Signed)
New Problem:    Called in needing the patient's cardiac clearance faxed to

## 2012-09-12 ENCOUNTER — Encounter (HOSPITAL_COMMUNITY): Payer: Self-pay | Admitting: Pharmacy Technician

## 2012-09-12 NOTE — H&P (Signed)
TOTAL HIP ADMISSION H&P  Patient is admitted for left total hip arthroplasty.  Subjective:  Chief Complaint: left hip pain  HPI: Randy [name], 67 y.o. male, has a history of pain and functional disability in the left hip(s) due to arthritis and patient has failed non-surgical conservative treatments for greater than 12 weeks to include NSAID's and/or analgesics, use of assistive devices and activity modification.  Onset of symptoms was gradual starting 2 years ago with gradually worsening course since that time.The patient noted no past surgery on the left hip(s).  Patient currently rates pain in the left hip at 7 out of 10 with activity. Patient has night pain, worsening of pain with activity and weight bearing and pain that interfers with activities of daily living. Patient has evidence of subchondral sclerosis, periarticular osteophytes and joint space narrowing by imaging studies. This condition presents safety issues increasing the risk of falls. This patient has had cardiac evaluation and clearance for surgical intervention..  There is no current active infection.  Patient Active Problem List   Diagnosis Date Noted  . Preop cardiovascular exam 05/29/2012  . ICD-St.Jude 05/21/2012  . Muscle cramps 01/10/2012  . Dehydration 01/10/2012  . Acute renal failure 01/10/2012  . Metabolic acidosis 01/10/2012  . Chronic systolic heart failure 05/01/2011  . IMPOTENCE OF ORGANIC ORIGIN 02/20/2010  . Unspecified vitamin D deficiency 02/14/2010  . DM 01/11/2010  . ICD-St.Jude 05/10/2009  . HYPERLIPIDEMIA 02/24/2008  . HYPERTENSION 02/24/2008  . MYOCARDIAL INFARCTION 02/24/2008  . HYPERSOMNIA UNSPECIFIED 02/24/2008   Past Medical History  Diagnosis Date  . Hypertension   . Dyslipidemia   . Diabetes mellitus   . CAD (coronary artery disease)   . Systolic dysfunction   . Vitamin D deficiency   . ICD (implantable cardiac defibrillator) in place     2009-St. Jude device  . Shortness of  breath   . Pacemaker   . Sleep apnea     started a sleep study but didn't completed, states PCP told him he has sleep  apnea, doesn't use CPAP  . GERD (gastroesophageal reflux disease)   . Neuromuscular disorder     neuropathy- in hands  . Arthritis     hip, hands- Osteoarthritis      Past Surgical History  Procedure Laterality Date  . Left carpal tunnel release    . Left rotator cuff surgery    . Back surgery      lumbar & cervical fusion   . Insert / replace / remove pacemaker      Prescriptions prior to admission  Medication Sig Dispense Refill  . aspirin 81 MG tablet Take 81 mg by mouth at bedtime.       . carvedilol (COREG) 6.25 MG tablet Take 6.25 mg by mouth 2 (two) times daily with a meal.       . glipiZIDE (GLUCOTROL) 5 MG tablet Take 5 mg by mouth 2 (two) times daily before a meal.       . lisinopril (PRINIVIL,ZESTRIL) 20 MG tablet Take 20 mg by mouth every other day.       . lovastatin (MEVACOR) 20 MG tablet Take 80 mg by mouth at bedtime.      . metFORMIN (GLUCOPHAGE) 500 MG tablet Take 500 mg by mouth 2 (two) times daily with a meal.      . niacin (NIASPAN) 500 MG CR tablet Take 500 mg by mouth at bedtime.      . Tamsulosin HCl (FLOMAX) 0.4 MG CAPS Take  0.4 mg by mouth every evening.       No Known Allergies  History  Substance Use Topics  . Smoking status: Former Smoker    Types: Cigarettes    Quit date: 07/30/1984  . Smokeless tobacco: Not on file  . Alcohol Use: No    Family History  Problem Relation Age of Onset  . Heart attack Father 23  . Diabetes Brother      Review of Systems  Constitutional: Negative.   HENT: Negative.   Eyes: Negative.   Respiratory: Negative.   Cardiovascular: Negative.   Gastrointestinal: Negative.   Genitourinary: Negative.   Musculoskeletal: Positive for joint pain.  Skin: Negative.   Neurological: Negative.   Endo/Heme/Allergies: Negative.   Psychiatric/Behavioral: Negative.     Objective:  Physical Exam   Constitutional: He is oriented to person, place, and time. He appears well-developed and well-nourished.  HENT:  Head: Normocephalic and atraumatic.  Eyes: EOM are normal. Pupils are equal, round, and reactive to light.  Neck: Normal range of motion. Neck supple.  Cardiovascular: Normal rate and regular rhythm.   Respiratory: Effort normal and breath sounds normal.  GI: Soft.  Musculoskeletal:  Painful and limited ROM of left hip.  FROM left knee. No distal edema.  Neurological: He is alert and oriented to person, place, and time.  Skin: Skin is warm and dry.  Psychiatric: He has a normal mood and affect.    Vital signs in last 24 hours: Temp:  [96.9 F (36.1 C)] 96.9 F (36.1 C) (02/19 1101) Pulse Rate:  [75] 75 (02/19 1101) Resp:  [18] 18 (02/19 1101) BP: (111)/(72) 111/72 mmHg (02/19 1101) SpO2:  [95 %] 95 % (02/19 1101)  Labs:   Estimated body mass index is 28.34 kg/(m^2) as calculated from the following:   Height as of 09/15/12: 5\' 9"  (1.753 m).   Weight as of 06/05/12: 87.091 kg (192 lb).   Imaging Review Plain radiographs demonstrate moderate degenerative joint disease of the left hip(s). The bone quality appears to be adequate for age and reported activity level.  Assessment/Plan:  End stage arthritis, left hip(s)  The patient history, physical examination, clinical judgement of the provider and imaging studies are consistent with end stage degenerative joint disease of the left hip(s) and total hip arthroplasty is deemed medically necessary. The treatment options including medical management, injection therapy, arthroscopy and arthroplasty were discussed at length. The risks and benefits of total hip arthroplasty were presented and reviewed. The risks due to aseptic loosening, infection, stiffness, dislocation/subluxation,  thromboembolic complications and other imponderables were discussed.  The patient acknowledged the explanation, agreed to proceed with the plan  and consent was signed. Patient is being admitted for inpatient treatment for surgery, pain control, PT, OT, prophylactic antibiotics, VTE prophylaxis, progressive ambulation and ADL's and discharge planning.The patient is planning to be discharged home with home health services

## 2012-09-15 ENCOUNTER — Encounter (HOSPITAL_COMMUNITY)
Admission: RE | Admit: 2012-09-15 | Discharge: 2012-09-15 | Disposition: A | Discharge status: Home / Self Care | Payer: Medicare Other | Source: Ambulatory Visit | Attending: Orthopaedic Surgery | Admitting: Orthopaedic Surgery

## 2012-09-15 ENCOUNTER — Encounter (HOSPITAL_COMMUNITY): Payer: Self-pay

## 2012-09-15 ENCOUNTER — Encounter (HOSPITAL_COMMUNITY): Payer: Self-pay | Admitting: Pharmacy Technician

## 2012-09-15 LAB — CBC
HCT: 40.1 % (ref 39.0–52.0)
Hemoglobin: 14 g/dL (ref 13.0–17.0)
MCH: 30.2 pg (ref 26.0–34.0)
MCV: 86.4 fL (ref 78.0–100.0)
RBC: 4.64 MIL/uL (ref 4.22–5.81)
WBC: 6.5 10*3/uL (ref 4.0–10.5)

## 2012-09-15 LAB — COMPREHENSIVE METABOLIC PANEL
AST: 22 U/L (ref 0–37)
BUN: 16 mg/dL (ref 6–23)
CO2: 27 mEq/L (ref 19–32)
Calcium: 9.4 mg/dL (ref 8.4–10.5)
Chloride: 102 mEq/L (ref 96–112)
Creatinine, Ser: 1.17 mg/dL (ref 0.50–1.35)
GFR calc Af Amer: 73 mL/min — ABNORMAL LOW (ref >90)
GFR calc non Af Amer: 63 mL/min — ABNORMAL LOW (ref >90)
Glucose, Bld: 101 mg/dL — ABNORMAL HIGH (ref 70–99)
Total Bilirubin: 0.2 mg/dL — ABNORMAL LOW (ref 0.3–1.2)

## 2012-09-15 LAB — TYPE AND SCREEN
ABO/RH(D): O NEG
Antibody Screen: NEGATIVE

## 2012-09-15 LAB — URINE MICROSCOPIC-ADD ON

## 2012-09-15 LAB — URINALYSIS, ROUTINE W REFLEX MICROSCOPIC
Hgb urine dipstick: NEGATIVE
Nitrite: NEGATIVE
Protein, ur: 30 mg/dL — AB
Specific Gravity, Urine: 1.019 (ref 1.005–1.030)
Urobilinogen, UA: 1 mg/dL (ref 0.0–1.0)

## 2012-09-15 NOTE — Telephone Encounter (Signed)
Sent, its on the stress test

## 2012-09-15 NOTE — Consult Note (Signed)
Anesthesia Chart Review:  See my note from 05/09/12.  Procedure was cancelled until patient could get cardiology clearance.  Since then patient has been evaluated by Dr. Lewayne Bunting.  A stress test was ordered which was done on 06/05/12 and showed large area of scar in the inferior,inferolateral inferoseptal and apical walls. No significant ischemia. LV Ejection Fraction: 34%. LV Wall Motion: Hypokinesis in the inferior, inferolateral and inferoseptal walls.  This was reviewed by Dr. Ladona Ridgel (see Results Review tab) and patient was felt "Ok to proceed with surgery."  Preoperative labs noted.  PT/PTT were not done at his PAT visit, so will need to be done on arrival.  Shonna Chock, [place] 09/15/12 1547

## 2012-09-15 NOTE — Pre-Procedure Instructions (Signed)
Randy [name]  09/15/2012   Your procedure is scheduled on:  09/17/12  Report to [facility] at 1030 AM.  Call this number if you have problems the morning of surgery: [phone]   Remember:   Do not eat food or drink liquids after midnight.   Take these medicines the morning of surgery with A SIP OF WATER: carvedilol,flomax   Do not wear jewelry, make-up or nail polish.  Do not wear lotions, powders, or perfumes. You may wear deodorant.  Do not shave 48 hours prior to surgery. Men may shave face and neck.  Do not bring valuables to the hospital.  Contacts, dentures or bridgework may not be worn into surgery.  Leave suitcase in the car. After surgery it may be brought to your room.  For patients admitted to the hospital, checkout time is 11:00 AM the day of  discharge.   Patients discharged the day of surgery will not be allowed to drive  home.  Name and phone number of your driver: family  Special Instructions: Shower using CHG 2 nights before surgery and the night before surgery.  If you shower the day of surgery use CHG.  Use special wash - you have one bottle of CHG for all showers.  You should use approximately 1/3 of the bottle for each shower.   Please read over the following fact sheets that you were given: Pain Booklet, Coughing and Deep Breathing, Blood Transfusion Information, MRSA Information and Surgical Site Infection Prevention

## 2012-09-15 NOTE — Progress Notes (Signed)
Icd form faxed to Dr Ladona Ridgel.  [facility] jUDE also notified.  [email or web address]@ [phone]    Cardiac Clearance req. From Dr Ladona Ridgel

## 2012-09-16 MED ORDER — CEFAZOLIN SODIUM-DEXTROSE 2-3 GM-% IV SOLR
2.0000 g | INTRAVENOUS | Status: AC
  Administered 2012-09-17: 2 g via INTRAVENOUS
  Filled 2012-09-16: qty 50

## 2012-09-17 ENCOUNTER — Encounter (HOSPITAL_COMMUNITY): Payer: Self-pay | Admitting: Anesthesiology

## 2012-09-17 ENCOUNTER — Inpatient Hospital Stay (HOSPITAL_COMMUNITY): Payer: Medicare Other

## 2012-09-17 ENCOUNTER — Encounter (HOSPITAL_COMMUNITY)
Admission: RE | Disposition: A | Discharge status: Home / Self Care | Payer: Self-pay | Source: Ambulatory Visit | Attending: Orthopaedic Surgery

## 2012-09-17 ENCOUNTER — Inpatient Hospital Stay (HOSPITAL_COMMUNITY): Payer: Medicare Other | Admitting: Anesthesiology

## 2012-09-17 ENCOUNTER — Inpatient Hospital Stay (HOSPITAL_COMMUNITY)
Admission: RE | Admit: 2012-09-17 | Discharge: 2012-09-19 | DRG: 470 | Disposition: A | Discharge status: Home / Self Care | Payer: Medicare Other | Source: Ambulatory Visit | Attending: Orthopaedic Surgery | Admitting: Orthopaedic Surgery

## 2012-09-17 ENCOUNTER — Encounter (HOSPITAL_COMMUNITY): Payer: Self-pay | Admitting: Vascular Surgery

## 2012-09-17 DIAGNOSIS — Z87891 Personal history of nicotine dependence: Secondary | ICD-10-CM

## 2012-09-17 DIAGNOSIS — Z79899 Other long term (current) drug therapy: Secondary | ICD-10-CM

## 2012-09-17 DIAGNOSIS — K219 Gastro-esophageal reflux disease without esophagitis: Secondary | ICD-10-CM | POA: Diagnosis present

## 2012-09-17 DIAGNOSIS — E119 Type 2 diabetes mellitus without complications: Secondary | ICD-10-CM | POA: Diagnosis present

## 2012-09-17 DIAGNOSIS — I252 Old myocardial infarction: Secondary | ICD-10-CM

## 2012-09-17 DIAGNOSIS — E559 Vitamin D deficiency, unspecified: Secondary | ICD-10-CM | POA: Diagnosis present

## 2012-09-17 DIAGNOSIS — M1610 Unilateral primary osteoarthritis, unspecified hip: Principal | ICD-10-CM | POA: Diagnosis present

## 2012-09-17 DIAGNOSIS — I5022 Chronic systolic (congestive) heart failure: Secondary | ICD-10-CM | POA: Diagnosis present

## 2012-09-17 DIAGNOSIS — I10 Essential (primary) hypertension: Secondary | ICD-10-CM | POA: Diagnosis present

## 2012-09-17 DIAGNOSIS — E785 Hyperlipidemia, unspecified: Secondary | ICD-10-CM | POA: Diagnosis present

## 2012-09-17 DIAGNOSIS — G5680 Other specified mononeuropathies of unspecified upper limb: Secondary | ICD-10-CM | POA: Diagnosis present

## 2012-09-17 DIAGNOSIS — G4730 Sleep apnea, unspecified: Secondary | ICD-10-CM | POA: Diagnosis present

## 2012-09-17 DIAGNOSIS — Z01812 Encounter for preprocedural laboratory examination: Secondary | ICD-10-CM

## 2012-09-17 DIAGNOSIS — M1612 Unilateral primary osteoarthritis, left hip: Secondary | ICD-10-CM | POA: Diagnosis present

## 2012-09-17 DIAGNOSIS — Z95810 Presence of automatic (implantable) cardiac defibrillator: Secondary | ICD-10-CM

## 2012-09-17 DIAGNOSIS — I2510 Atherosclerotic heart disease of native coronary artery without angina pectoris: Secondary | ICD-10-CM | POA: Diagnosis present

## 2012-09-17 DIAGNOSIS — Z7982 Long term (current) use of aspirin: Secondary | ICD-10-CM

## 2012-09-17 HISTORY — PX: TOTAL HIP ARTHROPLASTY: SHX124

## 2012-09-17 LAB — GLUCOSE, CAPILLARY: Glucose-Capillary: 93 mg/dL (ref 70–99)

## 2012-09-17 LAB — PROTIME-INR: Prothrombin Time: 14.3 seconds (ref 11.6–15.2)

## 2012-09-17 SURGERY — ARTHROPLASTY, HIP, TOTAL, ANTERIOR APPROACH
Anesthesia: General | Site: Hip | Laterality: Left | Wound class: Clean

## 2012-09-17 MED ORDER — INSULIN ASPART 100 UNIT/ML ~~LOC~~ SOLN
0.0000 [IU] | Freq: Three times a day (TID) | SUBCUTANEOUS | Status: DC
  Administered 2012-09-18: 3 [IU] via SUBCUTANEOUS

## 2012-09-17 MED ORDER — GLIPIZIDE 5 MG PO TABS
5.0000 mg | ORAL_TABLET | Freq: Two times a day (BID) | ORAL | Status: DC
  Administered 2012-09-18 – 2012-09-19 (×3): 5 mg via ORAL
  Filled 2012-09-17 (×5): qty 1

## 2012-09-17 MED ORDER — SIMVASTATIN 10 MG PO TABS
10.0000 mg | ORAL_TABLET | Freq: Every day | ORAL | Status: DC
  Administered 2012-09-17 – 2012-09-18 (×2): 10 mg via ORAL
  Filled 2012-09-17 (×3): qty 1

## 2012-09-17 MED ORDER — PHENYLEPHRINE HCL 10 MG/ML IJ SOLN
INTRAMUSCULAR | Status: DC | PRN
  Administered 2012-09-17: 80 ug via INTRAVENOUS

## 2012-09-17 MED ORDER — MORPHINE SULFATE (PF) 1 MG/ML IV SOLN
INTRAVENOUS | Status: AC
  Filled 2012-09-17: qty 25

## 2012-09-17 MED ORDER — ACETAMINOPHEN 325 MG PO TABS: 650.0000 mg | ORAL_TABLET | Freq: Four times a day (QID) | ORAL | Status: DC | PRN

## 2012-09-17 MED ORDER — OXYCODONE HCL 5 MG/5ML PO SOLN: 5.0000 mg | Freq: Once | ORAL | Status: DC | PRN

## 2012-09-17 MED ORDER — MORPHINE SULFATE (PF) 1 MG/ML IV SOLN
INTRAVENOUS | Status: DC
  Administered 2012-09-17: 1 mg via INTRAVENOUS
  Administered 2012-09-17: 6 mg via INTRAVENOUS
  Administered 2012-09-18: 4 mg via INTRAVENOUS
  Administered 2012-09-18: 3 mg via INTRAVENOUS
  Administered 2012-09-18: 7 mg via INTRAVENOUS
  Administered 2012-09-18: 3 mg via INTRAVENOUS

## 2012-09-17 MED ORDER — DOCUSATE SODIUM 100 MG PO CAPS
100.0000 mg | ORAL_CAPSULE | Freq: Two times a day (BID) | ORAL | Status: DC
  Administered 2012-09-17 – 2012-09-19 (×4): 100 mg via ORAL
  Filled 2012-09-17 (×5): qty 1

## 2012-09-17 MED ORDER — LACTATED RINGERS IV SOLN
INTRAVENOUS | Status: DC
  Administered 2012-09-17: 12:00:00 via INTRAVENOUS

## 2012-09-17 MED ORDER — CEFAZOLIN SODIUM 1-5 GM-% IV SOLN
1.0000 g | Freq: Four times a day (QID) | INTRAVENOUS | Status: AC
  Administered 2012-09-17 – 2012-09-18 (×2): 1 g via INTRAVENOUS
  Filled 2012-09-17 (×2): qty 50

## 2012-09-17 MED ORDER — GLYCOPYRROLATE 0.2 MG/ML IJ SOLN
INTRAMUSCULAR | Status: DC | PRN
  Administered 2012-09-17: 0.6 mg via INTRAVENOUS

## 2012-09-17 MED ORDER — OXYCODONE HCL 5 MG PO TABS
5.0000 mg | ORAL_TABLET | Freq: Once | ORAL | Status: DC | PRN
Start: 2012-09-17 — End: 2012-09-17

## 2012-09-17 MED ORDER — PROPOFOL 10 MG/ML IV BOLUS
INTRAVENOUS | Status: DC | PRN
  Administered 2012-09-17: 150 mg via INTRAVENOUS

## 2012-09-17 MED ORDER — METFORMIN HCL 500 MG PO TABS
500.0000 mg | ORAL_TABLET | Freq: Two times a day (BID) | ORAL | Status: DC
  Administered 2012-09-17 – 2012-09-19 (×4): 500 mg via ORAL
  Filled 2012-09-17 (×6): qty 1

## 2012-09-17 MED ORDER — NEOSTIGMINE METHYLSULFATE 1 MG/ML IJ SOLN
INTRAMUSCULAR | Status: DC | PRN
  Administered 2012-09-17: 3 mg via INTRAVENOUS

## 2012-09-17 MED ORDER — ROCURONIUM BROMIDE 100 MG/10ML IV SOLN
INTRAVENOUS | Status: DC | PRN
  Administered 2012-09-17: 50 mg via INTRAVENOUS

## 2012-09-17 MED ORDER — HYDROMORPHONE HCL PF 1 MG/ML IJ SOLN
INTRAMUSCULAR | Status: AC
  Filled 2012-09-17: qty 1

## 2012-09-17 MED ORDER — BISACODYL 10 MG RE SUPP: 10.0000 mg | Freq: Every day | RECTAL | Status: DC | PRN

## 2012-09-17 MED ORDER — TAMSULOSIN HCL 0.4 MG PO CAPS
0.4000 mg | ORAL_CAPSULE | Freq: Every evening | ORAL | Status: DC
  Administered 2012-09-18: 0.4 mg via ORAL
  Filled 2012-09-17 (×2): qty 1

## 2012-09-17 MED ORDER — PHENYLEPHRINE HCL 10 MG/ML IJ SOLN
10.0000 mg | INTRAVENOUS | Status: DC | PRN
  Administered 2012-09-17: 25 ug/min via INTRAVENOUS

## 2012-09-17 MED ORDER — ACETAMINOPHEN 650 MG RE SUPP: 650.0000 mg | Freq: Four times a day (QID) | RECTAL | Status: DC | PRN

## 2012-09-17 MED ORDER — CARVEDILOL 6.25 MG PO TABS
6.2500 mg | ORAL_TABLET | Freq: Two times a day (BID) | ORAL | Status: DC
  Administered 2012-09-17 – 2012-09-19 (×4): 6.25 mg via ORAL
  Filled 2012-09-17 (×6): qty 1

## 2012-09-17 MED ORDER — NIACIN ER (ANTIHYPERLIPIDEMIC) 500 MG PO TBCR
500.0000 mg | EXTENDED_RELEASE_TABLET | Freq: Every day | ORAL | Status: DC
  Administered 2012-09-17 – 2012-09-18 (×2): 500 mg via ORAL
  Filled 2012-09-17 (×3): qty 1

## 2012-09-17 MED ORDER — METOCLOPRAMIDE HCL 5 MG PO TABS
5.0000 mg | ORAL_TABLET | Freq: Three times a day (TID) | ORAL | Status: DC | PRN
  Filled 2012-09-17: qty 2

## 2012-09-17 MED ORDER — ONDANSETRON HCL 4 MG/2ML IJ SOLN
4.0000 mg | Freq: Four times a day (QID) | INTRAMUSCULAR | Status: DC | PRN
  Administered 2012-09-17 – 2012-09-18 (×2): 4 mg via INTRAVENOUS

## 2012-09-17 MED ORDER — SENNOSIDES-DOCUSATE SODIUM 8.6-50 MG PO TABS: 1.0000 | ORAL_TABLET | Freq: Every evening | ORAL | Status: DC | PRN

## 2012-09-17 MED ORDER — PHENOL 1.4 % MT LIQD: 1.0000 | OROMUCOSAL | Status: DC | PRN

## 2012-09-17 MED ORDER — OXYCODONE HCL 5 MG PO TABS
5.0000 mg | ORAL_TABLET | ORAL | Status: DC | PRN
  Administered 2012-09-18 – 2012-09-19 (×3): 10 mg via ORAL
  Filled 2012-09-17 (×3): qty 2

## 2012-09-17 MED ORDER — HYDROMORPHONE HCL PF 1 MG/ML IJ SOLN
0.2500 mg | INTRAMUSCULAR | Status: DC | PRN
  Administered 2012-09-17 (×4): 0.5 mg via INTRAVENOUS

## 2012-09-17 MED ORDER — METOCLOPRAMIDE HCL 5 MG/ML IJ SOLN: 10.0000 mg | Freq: Once | INTRAMUSCULAR | Status: DC | PRN

## 2012-09-17 MED ORDER — MENTHOL 3 MG MT LOZG: 1.0000 | LOZENGE | OROMUCOSAL | Status: DC | PRN

## 2012-09-17 MED ORDER — KETOROLAC TROMETHAMINE 15 MG/ML IJ SOLN
15.0000 mg | Freq: Three times a day (TID) | INTRAMUSCULAR | Status: AC
  Administered 2012-09-18 (×2): 15 mg via INTRAVENOUS
  Filled 2012-09-17 (×3): qty 1

## 2012-09-17 MED ORDER — SODIUM CHLORIDE 0.9 % IJ SOLN: 9.0000 mL | INTRAMUSCULAR | Status: DC | PRN

## 2012-09-17 MED ORDER — DIPHENHYDRAMINE HCL 12.5 MG/5ML PO ELIX: 12.5000 mg | ORAL_SOLUTION | Freq: Four times a day (QID) | ORAL | Status: DC | PRN

## 2012-09-17 MED ORDER — ONDANSETRON HCL 4 MG PO TABS: 4.0000 mg | ORAL_TABLET | Freq: Four times a day (QID) | ORAL | Status: DC | PRN

## 2012-09-17 MED ORDER — METOCLOPRAMIDE HCL 5 MG/ML IJ SOLN: 5.0000 mg | Freq: Three times a day (TID) | INTRAMUSCULAR | Status: DC | PRN

## 2012-09-17 MED ORDER — DIPHENHYDRAMINE HCL 50 MG/ML IJ SOLN: 12.5000 mg | Freq: Four times a day (QID) | INTRAMUSCULAR | Status: DC | PRN

## 2012-09-17 MED ORDER — LISINOPRIL 20 MG PO TABS
20.0000 mg | ORAL_TABLET | ORAL | Status: DC
  Administered 2012-09-19: 20 mg via ORAL
  Filled 2012-09-17 (×2): qty 1

## 2012-09-17 MED ORDER — NALOXONE HCL 0.4 MG/ML IJ SOLN: 0.4000 mg | INTRAMUSCULAR | Status: DC | PRN

## 2012-09-17 MED ORDER — LIDOCAINE HCL (CARDIAC) 20 MG/ML IV SOLN
INTRAVENOUS | Status: DC | PRN
  Administered 2012-09-17: 50 mg via INTRAVENOUS

## 2012-09-17 MED ORDER — FLEET ENEMA 7-19 GM/118ML RE ENEM: 1.0000 | ENEMA | Freq: Once | RECTAL | Status: AC | PRN

## 2012-09-17 MED ORDER — ASPIRIN EC 325 MG PO TBEC
325.0000 mg | DELAYED_RELEASE_TABLET | Freq: Every day | ORAL | Status: DC
  Administered 2012-09-18 – 2012-09-19 (×2): 325 mg via ORAL
  Filled 2012-09-17 (×3): qty 1

## 2012-09-17 MED ORDER — METHOCARBAMOL 500 MG PO TABS
500.0000 mg | ORAL_TABLET | Freq: Four times a day (QID) | ORAL | Status: DC | PRN
  Administered 2012-09-18: 500 mg via ORAL
  Filled 2012-09-17: qty 1

## 2012-09-17 MED ORDER — ZOLPIDEM TARTRATE 5 MG PO TABS: 5.0000 mg | ORAL_TABLET | Freq: Every evening | ORAL | Status: DC | PRN

## 2012-09-17 MED ORDER — ONDANSETRON HCL 4 MG/2ML IJ SOLN
4.0000 mg | Freq: Four times a day (QID) | INTRAMUSCULAR | Status: DC | PRN
  Filled 2012-09-17 (×2): qty 2

## 2012-09-17 MED ORDER — DEXTROSE 5 % IV SOLN: 500.0000 mg | Freq: Four times a day (QID) | INTRAVENOUS | Status: DC | PRN

## 2012-09-17 MED ORDER — FENTANYL CITRATE 0.05 MG/ML IJ SOLN
INTRAMUSCULAR | Status: DC | PRN
  Administered 2012-09-17: 100 ug via INTRAVENOUS
  Administered 2012-09-17 (×2): 50 ug via INTRAVENOUS
  Administered 2012-09-17: 150 ug via INTRAVENOUS
  Administered 2012-09-17: 50 ug via INTRAVENOUS

## 2012-09-17 MED ORDER — POTASSIUM CHLORIDE IN NACL 20-0.45 MEQ/L-% IV SOLN
INTRAVENOUS | Status: DC
  Administered 2012-09-18: 03:00:00 via INTRAVENOUS
  Filled 2012-09-17 (×5): qty 1000

## 2012-09-17 MED ORDER — LACTATED RINGERS IV SOLN
INTRAVENOUS | Status: DC | PRN
  Administered 2012-09-17 (×3): via INTRAVENOUS

## 2012-09-17 MED ORDER — 0.9 % SODIUM CHLORIDE (POUR BTL) OPTIME
TOPICAL | Status: DC | PRN
  Administered 2012-09-17: 1000 mL

## 2012-09-17 SURGICAL SUPPLY — 51 items
BLADE SAW SGTL 18X1.27X75 (BLADE) ×2 IMPLANT
BLADE SURG ROTATE 9660 (MISCELLANEOUS) IMPLANT
CELLS DAT CNTRL 66122 CELL SVR (MISCELLANEOUS) ×1 IMPLANT
CLOTH BEACON ORANGE TIMEOUT ST (SAFETY) ×2 IMPLANT
COVER SURGICAL LIGHT HANDLE (MISCELLANEOUS) ×2 IMPLANT
DERMABOND ADVANCED (GAUZE/BANDAGES/DRESSINGS) ×1
DERMABOND ADVANCED .7 DNX12 (GAUZE/BANDAGES/DRESSINGS) ×1 IMPLANT
DRAPE C-ARM 42X72 X-RAY (DRAPES) ×2 IMPLANT
DRAPE STERI IOBAN 125X83 (DRAPES) ×2 IMPLANT
DRAPE U-SHAPE 47X51 STRL (DRAPES) ×6 IMPLANT
DRSG MEPILEX BORDER 4X8 (GAUZE/BANDAGES/DRESSINGS) ×2 IMPLANT
DURAPREP 26ML APPLICATOR (WOUND CARE) ×2 IMPLANT
ELECT BLADE 4.0 EZ CLEAN MEGAD (MISCELLANEOUS)
ELECT BLADE TIP CTD 4 INCH (ELECTRODE) ×2 IMPLANT
ELECT CAUTERY BLADE 6.4 (BLADE) ×2 IMPLANT
ELECT REM PT RETURN 9FT ADLT (ELECTROSURGICAL) ×2
ELECTRODE BLDE 4.0 EZ CLN MEGD (MISCELLANEOUS) IMPLANT
ELECTRODE REM PT RTRN 9FT ADLT (ELECTROSURGICAL) ×1 IMPLANT
FACESHIELD LNG OPTICON STERILE (SAFETY) ×4 IMPLANT
GAUZE XEROFORM 1X8 LF (GAUZE/BANDAGES/DRESSINGS) ×2 IMPLANT
GLOVE BIOGEL PI IND STRL 7.5 (GLOVE) ×1 IMPLANT
GLOVE BIOGEL PI IND STRL 8 (GLOVE) ×1 IMPLANT
GLOVE BIOGEL PI INDICATOR 7.5 (GLOVE) ×1
GLOVE BIOGEL PI INDICATOR 8 (GLOVE) ×1
GLOVE ECLIPSE 7.0 STRL STRAW (GLOVE) ×2 IMPLANT
GLOVE ORTHO TXT STRL SZ7.5 (GLOVE) ×2 IMPLANT
GOWN PREVENTION PLUS LG XLONG (DISPOSABLE) IMPLANT
GOWN STRL NON-REIN LRG LVL3 (GOWN DISPOSABLE) ×4 IMPLANT
GOWN STRL REIN XL XLG (GOWN DISPOSABLE) ×2 IMPLANT
KIT BASIN OR (CUSTOM PROCEDURE TRAY) ×2 IMPLANT
KIT ROOM TURNOVER OR (KITS) ×2 IMPLANT
MANIFOLD NEPTUNE II (INSTRUMENTS) ×2 IMPLANT
NS IRRIG 1000ML POUR BTL (IV SOLUTION) ×2 IMPLANT
PACK TOTAL JOINT (CUSTOM PROCEDURE TRAY) ×2 IMPLANT
PAD ARMBOARD 7.5X6 YLW CONV (MISCELLANEOUS) ×4 IMPLANT
RTRCTR WOUND ALEXIS 18CM MED (MISCELLANEOUS) ×2
SPONGE LAP 18X18 X RAY DECT (DISPOSABLE) ×2 IMPLANT
SPONGE LAP 4X18 X RAY DECT (DISPOSABLE) IMPLANT
STAPLER VISISTAT 35W (STAPLE) ×2 IMPLANT
SUT ETHIBOND NAB CT1 #1 30IN (SUTURE) ×4 IMPLANT
SUT VIC AB 0 CT1 27 (SUTURE) ×2
SUT VIC AB 0 CT1 27XBRD ANBCTR (SUTURE) ×2 IMPLANT
SUT VIC AB 1 CT1 27 (SUTURE) ×2
SUT VIC AB 1 CT1 27XBRD ANBCTR (SUTURE) ×2 IMPLANT
SUT VIC AB 2-0 CT1 27 (SUTURE) ×2
SUT VIC AB 2-0 CT1 TAPERPNT 27 (SUTURE) ×2 IMPLANT
SUT VIC AB 4-0 PS2 27 (SUTURE) ×2 IMPLANT
TOWEL OR 17X24 6PK STRL BLUE (TOWEL DISPOSABLE) ×2 IMPLANT
TOWEL OR 17X26 10 PK STRL BLUE (TOWEL DISPOSABLE) ×4 IMPLANT
TRAY FOLEY CATH 14FR (SET/KITS/TRAYS/PACK) IMPLANT
WATER STERILE IRR 1000ML POUR (IV SOLUTION) ×4 IMPLANT

## 2012-09-17 NOTE — Brief Op Note (Cosign Needed)
09/17/2012  3:27 PM  PATIENT:  Randy [name]  67 y.o. male  PRE-OPERATIVE DIAGNOSIS:  Left hip osteoarthritis  POST-OPERATIVE DIAGNOSIS:  Left hip osteoarthritis  PROCEDURE:  Procedure(s): Left TOTAL HIP ARTHROPLASTY ANTERIOR APPROACH (Left)  SURGEON:  Surgeon(s) and Role:    * Eldred Manges, MD - Primary  PHYSICIAN ASSISTANT: [name] [facility]  ASSISTANTS: none   ANESTHESIA:   general  EBL:  Total I/O In: 2000 [I.V.:2000] Out: 525 [Urine:275; Blood:250]  BLOOD ADMINISTERED:none  DRAINS: none   LOCAL MEDICATIONS USED:  NONE  SPECIMEN:  No Specimen  DISPOSITION OF SPECIMEN:  N/A  COUNTS:  YES  TOURNIQUET:  * No tourniquets in log *  DICTATION: .Note written in EPIC  PLAN OF CARE: Admit to inpatient   PATIENT DISPOSITION:  PACU - hemodynamically stable.   Delay start of Pharmacological VTE agent (>24hrs) due to surgical blood loss or risk of bleeding: yes

## 2012-09-17 NOTE — Preoperative (Signed)
Beta Blockers   Reason not to administer Beta Blockers:Not Applicable 

## 2012-09-17 NOTE — Anesthesia Preprocedure Evaluation (Signed)
Anesthesia Evaluation  Patient identified by MRN, date of birth, ID band Patient awake    Reviewed: Allergy & Precautions, H&P , NPO status , Patient's Chart, lab work & pertinent test results, reviewed documented beta blocker date and time   Airway Mallampati: II TM Distance: >3 FB Neck ROM: full    Dental   Pulmonary shortness of breath, sleep apnea ,  breath sounds clear to auscultation        Cardiovascular hypertension, On Medications and On Home Beta Blockers + CAD and + Past MI negative cardio ROS  + pacemaker + Cardiac Defibrillator Rhythm:regular     Neuro/Psych PSYCHIATRIC DISORDERS  Neuromuscular disease    GI/Hepatic negative GI ROS, Neg liver ROS, GERD-  Medicated and Controlled,  Endo/Other  diabetes, Oral Hypoglycemic Agents  Renal/GU Renal disease  negative genitourinary   Musculoskeletal   Abdominal   Peds  Hematology negative hematology ROS (+)   Anesthesia Other Findings See surgeon's H&P   Reproductive/Obstetrics negative OB ROS                           Anesthesia Physical Anesthesia Plan  ASA: III  Anesthesia Plan: General   Post-op Pain Management:    Induction: Intravenous  Airway Management Planned: Oral ETT  Additional Equipment:   Intra-op Plan:   Post-operative Plan: Extubation in OR  Informed Consent: I have reviewed the patients History and Physical, chart, labs and discussed the procedure including the risks, benefits and alternatives for the proposed anesthesia with the patient or authorized representative who has indicated his/her understanding and acceptance.   Dental Advisory Given  Plan Discussed with: CRNA and Surgeon  Anesthesia Plan Comments:         Anesthesia Quick Evaluation

## 2012-09-17 NOTE — Interval H&P Note (Signed)
History and Physical Interval Note:  09/17/2012 12:34 PM  Randy [name]  has presented today for surgery, with the diagnosis of Left hip osteoarthritis  The various methods of treatment have been discussed with the patient and family. After consideration of risks, benefits and other options for treatment, the patient has consented to  Procedure(s): Left TOTAL HIP ARTHROPLASTY ANTERIOR APPROACH (Left) as a surgical intervention .  The patient's history has been reviewed, patient examined, no change in status, stable for surgery.  I have reviewed the patient's chart and labs.  Questions were answered to the patient's satisfaction.     YATES,MARK C

## 2012-09-17 NOTE — Anesthesia Procedure Notes (Signed)
Procedure Name: Intubation Date/Time: 09/17/2012 12:52 PM Performed by: Gwenyth Allegra Pre-anesthesia Checklist: Timeout performed, Patient identified, Emergency Drugs available, Suction available and Patient being monitored Patient Re-evaluated:Patient Re-evaluated prior to inductionOxygen Delivery Method: Circle system utilized Preoxygenation: Pre-oxygenation with 100% oxygen Intubation Type: IV induction Ventilation: Mask ventilation without difficulty and Oral airway inserted - appropriate to patient size Laryngoscope Size: Mac and 4 Grade View: Grade I Tube type: Oral Tube size: 8.0 mm Number of attempts: 1 Airway Equipment and Method: Stylet Placement Confirmation: ETT inserted through vocal cords under direct vision,  breath sounds checked- equal and bilateral and positive ETCO2 Secured at: 21 cm Tube secured with: Tape Dental Injury: Teeth and Oropharynx as per pre-operative assessment

## 2012-09-17 NOTE — Anesthesia Postprocedure Evaluation (Signed)
Anesthesia Post Note  Patient: Randy [name]  Procedure(s) Performed: Procedure(s) (LRB): Left TOTAL HIP ARTHROPLASTY ANTERIOR APPROACH (Left)  Anesthesia type: General  Patient location: PACU  Post pain: Pain level controlled and Adequate analgesia  Post assessment: Post-op Vital signs reviewed, Patient's Cardiovascular Status Stable, Respiratory Function Stable, Patent Airway and Pain level controlled  Last Vitals:  Filed Vitals:   09/17/12 1553  BP:   Pulse:   Temp: 36.3 C  Resp:     Post vital signs: Reviewed and stable  Level of consciousness: awake, alert  and oriented  Complications: No apparent anesthesia complications

## 2012-09-17 NOTE — Transfer of Care (Signed)
Immediate Anesthesia Transfer of Care Note  Patient: Randy [name]  Procedure(s) Performed: Procedure(s): Left TOTAL HIP ARTHROPLASTY ANTERIOR APPROACH (Left)  Patient Location: PACU  Anesthesia Type:General  Level of Consciousness: awake and alert   Airway & Oxygen Therapy: Patient Spontanous Breathing and Patient connected to nasal cannula oxygen  Post-op Assessment: Report given to PACU RN and Post -op Vital signs reviewed and stable  Post vital signs: Reviewed and stable  Complications: No apparent anesthesia complications

## 2012-09-18 ENCOUNTER — Encounter (HOSPITAL_COMMUNITY): Payer: Self-pay | Admitting: General Practice

## 2012-09-18 LAB — CBC
HCT: 30.7 % — ABNORMAL LOW (ref 39.0–52.0)
Hemoglobin: 10.5 g/dL — ABNORMAL LOW (ref 13.0–17.0)
MCHC: 34.2 g/dL (ref 30.0–36.0)
MCV: 85.5 fL (ref 78.0–100.0)
RDW: 13.4 % (ref 11.5–15.5)
WBC: 8.7 10*3/uL (ref 4.0–10.5)

## 2012-09-18 LAB — BASIC METABOLIC PANEL
BUN: 25 mg/dL — ABNORMAL HIGH (ref 6–23)
Chloride: 100 mEq/L (ref 96–112)
Creatinine, Ser: 1.26 mg/dL (ref 0.50–1.35)
Glucose, Bld: 152 mg/dL — ABNORMAL HIGH (ref 70–99)
Potassium: 4.3 mEq/L (ref 3.5–5.1)

## 2012-09-18 LAB — GLUCOSE, CAPILLARY
Glucose-Capillary: 150 mg/dL — ABNORMAL HIGH (ref 70–99)
Glucose-Capillary: 109 mg/dL — ABNORMAL HIGH (ref 70–99)

## 2012-09-18 NOTE — Progress Notes (Signed)
Physical Therapy Treatment Patient Details Name: Randy [name] MRN: [id number] DOB: 1946-02-04 Today's Date: 09/18/2012 Time: 4098-1191 PT Time Calculation (min): 17 min  PT Assessment / Plan / Recommendation Comments on Treatment Session  Patient making great progress this afternoon. Did give ant hip prec handout as MD has told PT that he prefers that they follow precautions. Will follow up as needed. Cont with current POC    Follow Up Recommendations  Home health PT     Does the patient have the potential to tolerate intense rehabilitation     Barriers to Discharge        Equipment Recommendations  Rolling walker with 5" wheels    Recommendations for Other Services    Frequency 7X/week   Plan Discharge plan remains appropriate;Frequency remains appropriate    Precautions / Restrictions Precautions Precautions: None Precaution Comments: Per [name] on 09/11/12, pt with direct anterior hip precautions, with comments "no hip precautions" Restrictions Weight Bearing Restrictions: Yes LLE Weight Bearing: Weight bearing as tolerated   Pertinent Vitals/Pain no apparent distress     Mobility  Bed Mobility Bed Mobility: Sit to Supine Supine to Sit: 4: Min assist;HOB flat Sit to Supine: 5: Supervision;HOB flat Details for Bed Mobility Assistance: cues for safety and positioning Transfers Sit to Stand: 4: Min guard;From elevated surface;With upper extremity assist;From bed Stand to Sit: 4: Min guard;With upper extremity assist;To elevated surface;To bed Details for Transfer Assistance: Cues for safe technique Ambulation/Gait Ambulation/Gait Assistance: 4: Min guard Ambulation Distance (Feet): 140 Feet Assistive device: Rolling walker Ambulation/Gait Assistance Details: Cues for positioning within RW and for LLE placement Gait Pattern: Step-to pattern Gait velocity: [facility]    Exercises Total Joint Exercises Quad Sets: AAROM;Left;10 reps Heel Slides:  AAROM;Left;10 reps   PT Diagnosis:    PT Problem List:   PT Treatment Interventions:     PT Goals Acute Rehab PT Goals PT Goal: Supine/Side to Sit - Progress: Progressing toward goal PT Goal: Sit to Supine/Side - Progress: Progressing toward goal PT Goal: Sit to Stand - Progress: Progressing toward goal PT Goal: Stand to Sit - Progress: Progressing toward goal PT Goal: Ambulate - Progress: Progressing toward goal PT Goal: Perform Home Exercise Program - Progress: Progressing toward goal Additional Goals PT Goal: Additional Goal #1 - Progress: Progressing toward goal  Visit Information  Last PT Received On: 09/18/12 Assistance Needed: +1    Subjective Data      Cognition  Cognition Overall Cognitive Status: Appears within functional limits for tasks assessed/performed Arousal/Alertness: Awake/alert Orientation Level: Appears intact for tasks assessed Behavior During Session: [facility] for tasks performed    Balance     End of Session PT - End of Session Equipment Utilized During Treatment: Gait belt Activity Tolerance: Patient tolerated treatment well Patient left: with call bell/phone within reach;in bed   GP     Fredrich Birks 09/18/2012, 2:57 PM 09/18/2012 Fredrich Birks PTA [phone] pager [phone] office

## 2012-09-18 NOTE — Op Note (Signed)
Randy, [name] NO.:  [id number]  MEDICAL RECORD NO.:  [id number]  LOCATION:  5N10C                        FACILITY:  [facility]  PHYSICIAN:  Zakariyah Freimark C. Ophelia Charter, M.D.    DATE OF BIRTH:  February 24, 1946  DATE OF PROCEDURE:  09/17/2012 DATE OF DISCHARGE:                              OPERATIVE REPORT   PREOPERATIVE DIAGNOSIS:  Left hip osteoarthritis.  POSTOPERATIVE DIAGNOSIS:  Left hip osteoarthritis.  PROCEDURE:  Direct anterior left total hip arthroplasty.  SURGEON:  Ciella Obi C. Ophelia Charter, M.D.  ASSISTANTS:  [name], PA-C, medically necessary and present for the entire procedure.  ESTIMATED BLOOD LOSS:  Less than 100 mL.  COMPONENTS:  DePuy Cori #10 stem, +1.5 neck, 54 mm acetabular shell with a single dome screw, poly liner without lip and a 36 mm ball.  DESCRIPTION OF PROCEDURE:  After induction of general anesthesia, the patient was placed on a Hanna table.  The patient had a long history of hip osteoarthritis progressed to the point where he was having difficulty ambulating, sleeping, and taking pain medication.  He responded to intra-articular injection of the hip with relief, but this was only temporary.  He had used a cane for ambulation and had progressive joint space narrowing and sclerosis.  Procedure was continued after the C-arm was brought in to make sure the hip was well-localized and visualized and leg lengths were equal corresponding with the standing preoperative x-ray.  Both heads were placed in a 20-25 degrees external rotation, so the lesser trochanter will be visualized.  The 10/15 drapes were placed above the iliac crest and middle to distal third of the femur and prepping with DuraPrep was performed. Preoperative Ancef was given prophylactically.  Time-out procedure was done well while a DuraPrep was drying.  Sticky U drape and another 10/15 drape were applied advancing 0.5 inch onto the skin.  Large ________, Betadine, Steri-Drape was  applied.  Incision was made over the tensor starting 1-2 cm distal and 2 cm posterior to the ASIS.  Fascia was elevated after plain was developed around the subcutaneous tissue and skin protector was placed.  The fascia of the tensor was elevated. Hibbs retractor was placed lateral and sharp Hohmann medially over the neck.  Initially, we are little bit high and with removal of tissue, the normal arterial vessels were not visualized inferiorly as expected. With opening, the capsules were directly down on the head and further resection was performed for distal clean off of the anterior neck taken off the anterior capsule.  Hohmann or sharp Cobra was placed inside the capsule.  Neck was cut initially starting the neck cut and then checking under fluoroscopy, and then completing the cut laterally with an osteotome.  We needed to go little bit more lateral in the trochanter and this was done after initial broaching on the femoral side showed that we were a little bit too medially to approach to avoid stem varus position.  On the acetabular side, the labrum was resected circumferentially.  Transverse acetabular ligament was divided for axis sequential reaming up to a 53 for 54 cup which gave good wall fit. Permanent three-hole dome screw cup was inserted and initially bottomed  out fairly well but had reamed a little bit inside a rim fit and was continually adjusted and checked under fluoroscopy until finally seated down and was secure.  With some difficulty getting appropriately seated with the 20 degrees of cup flexion 45 degrees of abduction, a single dome screw was inserted and it just came through the cortex.  This was a 25 mm dome screw.  After irrigation, the Nova polyethylene was inserted. Large trochanteric retractor was placed once soft tissue was cleaned out behind the trochanter and then extensive use of the cookie cutter and curette rongeur to lateralize initial approach.  We  continued to work on this until finally the chili pepper was able be used followed by sequential broaching.  Spot fluoro was used and it appeared the prosthesis on the femoral side was still a little bit of varus that was removed and continued to work collateral to make it better approach. This was somewhat difficult with the tightness of the exposure as typical with this exposure.  The medial retractor was used medially on the neck and the large curved a trochanteric bent Hohmann retractor was used over the tip of the trochanter for exposure.  Continued to work until finally 10 was seated, +1.5 was inserted, there was restoration of leg length.  Good stability, external rotation to 100 degrees trace shock.  No impingement anteriorly and the posterior capsule was preserved.  Trials were removed.  Permanent prosthesis trial was inserted.  Permanent 1.5 metal ball was inserted.  Identical findings of stability, leg lengths were equal, drawing lines in the lesser trochanter through the tip of the ischial tuberosities.  After irrigation, anterior capsule was reapproximated as well as the gluteus medius that had been opened slightly up onto the acetabulum proximally. Fascia was reapproximated with running locking stitch going to the subcutaneous tissue, subcuticular closure, Marcaine infiltration, tincture of benzoin, Steri-Strips, postoperative dressing and transferred to care room in stable condition.     Randy [name] C. Ophelia Charter, M.D.     MCY/MEDQ  D:  09/17/2012  T:  09/18/2012  Job:  [id number]

## 2012-09-18 NOTE — Evaluation (Signed)
Physical Therapy Evaluation Patient Details Name: Randy [name] MRN: [id number] DOB: January 29, 1946 Today's Date: 09/18/2012 Time: 1478-2956 PT Time Calculation (min): 18 min  PT Assessment / Plan / Recommendation Clinical Impression  Pt is a  28 y male s/p LTHA.  Pt will benefit from PT to maximize mobility for d/c to home.      PT Assessment  Patient needs continued PT services    Follow Up Recommendations  Home health PT    Does the patient have the potential to tolerate intense rehabilitation      Barriers to Discharge None      Equipment Recommendations  Rolling walker with 5" wheels    Recommendations for Other Services     Frequency 7X/week    Precautions / Restrictions Precautions Precautions: Anterior Hip Precaution Comments: Educated pt on 3/3 anterior hip precautions and provided pt with handouts.   Restrictions Weight Bearing Restrictions: Yes LLE Weight Bearing: Weight bearing as tolerated   Pertinent Vitals/Pain 2-3/10 pain in hip.  PCA d/c'd notified RN.       Mobility  Bed Mobility Bed Mobility: Supine to Sit Supine to Sit: 4: Min assist;HOB flat Details for Bed Mobility Assistance: Min assist for LLE.  VCs for technique.  Transfers Transfers: Sit to Stand;Stand to Sit Sit to Stand: 4: Min guard;From bed;With upper extremity assist Stand to Sit: 4: Min guard;To chair/3-in-1;With upper extremity assist Details for Transfer Assistance: VCs for safe technique.  Ambulation/Gait Ambulation/Gait Assistance: 4: Min guard Ambulation Distance (Feet): 10 Feet Assistive device: Rolling walker Ambulation/Gait Assistance Details: VCs for gait sequencing and WBAT on LLE.   Gait Pattern: Step-to pattern Stairs: No    Exercises Total Joint Exercises Ankle Circles/Pumps: 10 reps   PT Diagnosis: Difficulty walking;Acute pain;Generalized weakness  PT Problem List: Decreased strength;Decreased activity tolerance;Decreased range of motion;Decreased  mobility;Decreased knowledge of use of DME;Decreased knowledge of precautions;Pain PT Treatment Interventions: DME instruction;Gait training;Stair training;Functional mobility training;Therapeutic activities;Patient/family education;Therapeutic exercise   PT Goals Acute Rehab PT Goals PT Goal Formulation: With patient Time For Goal Achievement: 09/25/12 Potential to Achieve Goals: Good Pt will go Supine/Side to Sit: with modified independence PT Goal: Supine/Side to Sit - Progress: Goal set today Pt will go Sit to Supine/Side: with modified independence PT Goal: Sit to Supine/Side - Progress: Goal set today Pt will go Sit to Stand: with modified independence PT Goal: Sit to Stand - Progress: Goal set today Pt will go Stand to Sit: with modified independence PT Goal: Stand to Sit - Progress: Goal set today Pt will Ambulate: 51 - 150 feet;with rolling walker PT Goal: Ambulate - Progress: Goal set today Pt will Go Up / Down Stairs: 3-5 stairs;with supervision;with least restrictive assistive device PT Goal: Up/Down Stairs - Progress: Goal set today Pt will Perform Home Exercise Program: Independently PT Goal: Perform Home Exercise Program - Progress: Goal set today Additional Goals Additional Goal #1: Pt will verbalize and Demonstrate 3/3 anterior hip precautions.  PT Goal: Additional Goal #1 - Progress: Goal set today  Visit Information  Last PT Received On: 09/18/12 Assistance Needed: +1    Subjective Data  Subjective: Agree to eval   Prior Functioning  Home Living Lives With: Friend(s) Type of Home: House Home Access: Stairs to enter [employer] of Steps: 4 Entrance Stairs-Rails: Right;Left Home Layout: One level Bathroom Shower/Tub: Forensic scientist: Handicapped height Home Adaptive Equipment: None Prior Function Level of Independence: Independent Able to Take Stairs?: Yes Driving: Yes Vocation: Retired Musician:  No difficulties    Cognition  Cognition Overall Cognitive Status: Appears within functional limits for tasks assessed/performed Arousal/Alertness: Awake/alert Orientation Level: Appears intact for tasks assessed Behavior During Session: [facility] for tasks performed    Extremity/Trunk Assessment Right Upper Extremity Assessment RUE ROM/Strength/Tone: Within functional levels Left Upper Extremity Assessment LUE ROM/Strength/Tone: Within functional levels Right Lower Extremity Assessment RLE ROM/Strength/Tone: Within functional levels Left Lower Extremity Assessment LLE ROM/Strength/Tone: Unable to fully assess   Balance Balance Balance Assessed: No  End of Session PT - End of Session Equipment Utilized During Treatment: Gait belt Activity Tolerance: Patient tolerated treatment well Patient left: in chair;with call bell/phone within reach Nurse Communication: Mobility status  GP     Randell Teare 09/18/2012, 12:15 PM  Manal Kreutzer L. Amen Staszak DPT [phone]

## 2012-09-18 NOTE — Progress Notes (Signed)
UR COMPLETED  

## 2012-09-18 NOTE — Evaluation (Signed)
Occupational Therapy Evaluation Patient Details Name: BRAYDEN BRODHEAD MRN: [id number] DOB: Mar 26, 1946 Today's Date: 09/18/2012 Time: 4401-0272 OT Time Calculation (min): 34 min  OT Assessment / Plan / Recommendation Clinical Impression  This 67 y.o. male admitted for Lt. THA, direct anterior approach.  Pt. will have assist with LB ADLs at discharge until he is able to access Lt. foot.  Discussed AE with he and significant other, and they are comfortable with her assiting him at discharge.  Currently, pt unable to perform toilet transfer without bil. UE assist and may need 3-in-1 at discharge.  Pt. will sponge bathe until he is able to step over top of tub.    OT Assessment  Patient does not need any further OT services    Follow Up Recommendations  No OT follow up;Supervision/Assistance - 24 hour    Barriers to Discharge      Equipment Recommendations  3 in 1 bedside comode    Recommendations for Other Services    Frequency       Precautions / Restrictions Precautions Precautions: None Precaution Comments: Per [name] on 09/11/12, pt with direct anterior hip precautions, with comments "no hip precautions" Restrictions Weight Bearing Restrictions: Yes LLE Weight Bearing: Weight bearing as tolerated       ADL  Eating/Feeding: Independent Where Assessed - Eating/Feeding: Chair Grooming: Wash/dry hands;Teeth care;Min guard Where Assessed - Grooming: Supported standing Upper Body Bathing: Set up Where Assessed - Upper Body Bathing: Unsupported sitting Lower Body Bathing: Minimal assistance Where Assessed - Lower Body Bathing: Supported sit to stand Upper Body Dressing: Set up Where Assessed - Upper Body Dressing: Unsupported sitting Lower Body Dressing: Moderate assistance Where Assessed - Lower Body Dressing: Supported sit to stand Toilet Transfer: Hydrographic surveyor Method: Sit to Barista: Raised toilet seat with arms (or 3-in-1  over toilet) Toileting - Clothing Manipulation and Hygiene: Min guard Where Assessed - Glass blower/designer Manipulation and Hygiene: Standing Equipment Used: Rolling walker Transfers/Ambulation Related to ADLs: Pt ambulates to BR with min guard assist. ADL Comments: Pt not able to access Lt. foot for LB ADLs.  Encouraged pt. to attempt daily.  Pt. reports g-friend will assist him at home.  Discussed use of AE, but they are comfortable with her assisting him.  Also discussed options for tub transfers.  Pt does not want a tub seat, and will sponge bathe until he is able to step over tub.  Currently, pt unable to move stand to sit on commode without bil. UE support.     OT Diagnosis:    OT Problem List:   OT Treatment Interventions:     OT Goals    Visit Information  Last OT Received On: 09/18/12 Assistance Needed: +1    Subjective Data  Subjective: "My wife will help me Patient Stated Goal: To go home and regain independence   Prior Functioning     Home Living Lives With: Friend(s) Available Help at Discharge: Friend(s);Available 24 hours/day Type of Home: House Home Access: Stairs to enter [employer] of Steps: 4 Entrance Stairs-Rails: Right;Left Home Layout: One level Bathroom Shower/Tub: Forensic scientist: Handicapped height Bathroom Accessibility: Yes How Accessible: Accessible via walker Home Adaptive Equipment: None Prior Function Level of Independence: Needs assistance Needs Assistance: Dressing Dressing: Minimal Able to Take Stairs?: Yes Driving: Yes Vocation: Retired Musician: No difficulties         Vision/Perception     Copywriter, advertising Overall Cognitive Status: Appears within  functional limits for tasks assessed/performed Arousal/Alertness: Awake/alert Orientation Level: Appears intact for tasks assessed Behavior During Session: [facility] for tasks performed    Extremity/Trunk Assessment Right  Upper Extremity Assessment RUE ROM/Strength/Tone: Within functional levels RUE Coordination: WFL - gross/fine motor Left Upper Extremity Assessment LUE ROM/Strength/Tone: Within functional levels LUE Coordination: WFL - gross/fine motor Right Lower Extremity Assessment RLE ROM/Strength/Tone: Within functional levels Left Lower Extremity Assessment LLE ROM/Strength/Tone: Unable to fully assess Trunk Assessment Trunk Assessment: Normal     Mobility Bed Mobility Bed Mobility: Sit to Supine Supine to Sit: 4: Min assist;HOB flat Sit to Supine: 5: Supervision;HOB flat Details for Bed Mobility Assistance: min verbal cues for technique Transfers Transfers: Sit to Stand;Stand to Sit Sit to Stand: 4: Min guard;From elevated surface;With upper extremity assist;From chair/3-in-1 Stand to Sit: 4: Min guard;With upper extremity assist;To elevated surface;To chair/3-in-1 Details for Transfer Assistance: VCs for safe technique.      Exercise Total Joint Exercises Ankle Circles/Pumps: 10 reps   Balance Balance Balance Assessed: No   End of Session    GO     Denya Buckingham M 09/18/2012, 1:54 PM

## 2012-09-19 ENCOUNTER — Encounter (HOSPITAL_COMMUNITY): Payer: Self-pay | Admitting: Orthopaedic Surgery

## 2012-09-19 LAB — CBC
MCHC: 34.1 g/dL (ref 30.0–36.0)
Platelets: 121 10*3/uL — ABNORMAL LOW (ref 150–400)
RDW: 13.2 % (ref 11.5–15.5)
WBC: 9.4 10*3/uL (ref 4.0–10.5)

## 2012-09-19 MED ORDER — METHOCARBAMOL 500 MG PO TABS: 500.0000 mg | ORAL_TABLET | Freq: Four times a day (QID) | ORAL | Status: DC | PRN

## 2012-09-19 MED ORDER — OXYCODONE-ACETAMINOPHEN 5-325 MG PO TABS: 1.0000 | ORAL_TABLET | ORAL | Status: DC | PRN

## 2012-09-19 NOTE — Progress Notes (Signed)
Physical Therapy Treatment Patient Details Name: Randy [name] MRN: [id number] DOB: 26-Nov-1945 Today's Date: 09/19/2012 Time: 4098-1191 PT Time Calculation (min): 20 min  PT Assessment / Plan / Recommendation Comments on Treatment Session  Patient was seen this PM to ensure safety wtih gait with use of RW. Patient has tendency to not focus on use of RW. Patient has assistance at home from friend as needed. Planning to discharge this afternoon    Follow Up Recommendations  Home health PT     Does the patient have the potential to tolerate intense rehabilitation     Barriers to Discharge        Equipment Recommendations  Rolling walker with 5" wheels    Recommendations for Other Services    Frequency 7X/week   Plan Discharge plan remains appropriate;Frequency remains appropriate    Precautions / Restrictions Precautions Precautions: None;Anterior Hip Precaution Booklet Issued: Yes (comment) Precaution Comments: Per [name] on 09/11/12, pt with direct anterior hip precautions, with comments "no hip precautions" Restrictions Weight Bearing Restrictions: Yes LLE Weight Bearing: Weight bearing as tolerated   Pertinent Vitals/Pain no apparent distress     Mobility  Bed Mobility Supine to Sit: 6: Modified independent (Device/Increase time) Sit to Supine: 6: Modified independent (Device/Increase time) Transfers Sit to Stand: 5: Supervision;With armrests;From bed Stand to Sit: 5: Supervision;To bed;With upper extremity assist Details for Transfer Assistance: Supervision for safety and cues for hand placement Ambulation/Gait Ambulation/Gait Assistance: 5: Supervision Ambulation Distance (Feet): 200 Feet Assistive device: Rolling walker Ambulation/Gait Assistance Details: Patient was giving cues for internal rotation of LEs  Gait Pattern: Step-to pattern Gait velocity: WFL Stairs: Yes Stairs Assistance: 4: Min guard Stair Management Technique: Step to  pattern;Forwards;Two rails Number of Stairs: 4    Exercises Total Joint Exercises Long Arc Quad: AROM;Left;10 reps   PT Diagnosis:    PT Problem List:   PT Treatment Interventions:     PT Goals Acute Rehab PT Goals PT Goal: Supine/Side to Sit - Progress: Met PT Goal: Sit to Supine/Side - Progress: Met PT Goal: Sit to Stand - Progress: Progressing toward goal PT Goal: Stand to Sit - Progress: Progressing toward goal PT Goal: Ambulate - Progress: Progressing toward goal PT Goal: Up/Down Stairs - Progress: Progressing toward goal PT Goal: Perform Home Exercise Program - Progress: Progressing toward goal Additional Goals PT Goal: Additional Goal #1 - Progress: Progressing toward goal  Visit Information  Last PT Received On: 09/19/12 Assistance Needed: +1    Subjective Data      Cognition  Cognition Overall Cognitive Status: Appears within functional limits for tasks assessed/performed Arousal/Alertness: Awake/alert Orientation Level: Appears intact for tasks assessed Behavior During Session: [facility] for tasks performed    Balance     End of Session PT - End of Session Equipment Utilized During Treatment: Gait belt Activity Tolerance: Patient tolerated treatment well Patient left: with call bell/phone within reach;in bed Nurse Communication: Mobility status   GP     Fredrich Birks 09/19/2012, 1:51 PM  09/19/2012 Fredrich Birks PTA [phone] pager [phone] office

## 2012-09-19 NOTE — Progress Notes (Signed)
CARE MANAGEMENT NOTE 09/19/2012  Patient:  Randy [name], Randy [name]   Account Number:  [id number]  Date Initiated:  09/19/2012  Documentation initiated by:  Vance Peper  Subjective/Objective Assessment:   67 yr old male s/p left total hip arthroplasty.     Action/Plan:   CM spoke with patient concerning DME needs. Has assistance at discharge.   Anticipated DC Date:  09/19/2012   Anticipated DC Plan:  HOME/SELF CARE      DC Planning Services  CM consult      PAC Choice  DURABLE MEDICAL EQUIPMENT   Choice offered to / List presented to:  NA   DME arranged  [facility]  3-N-1      DME agency  [facility]     [facility] arranged  NA      Status of service:  Completed, signed off Medicare Important Message given?   (If response is "NO", the following Medicare IM given date fields will be blank) Date Medicare IM given:   Date Additional Medicare IM given:    Discharge Disposition:  HOME W HOME HEALTH SERVICES  Per UR Regulation:    If discussed at Long Length of Stay Meetings, dates discussed:    Comments:

## 2012-09-19 NOTE — Progress Notes (Signed)
Subjective: 2 Days Post-Op Procedure(s) (LRB): Left TOTAL HIP ARTHROPLASTY ANTERIOR APPROACH (Left) Patient reports pain as mild.    Objective: Vital signs in last 24 hours: Temp:  [98 F (36.7 C)-99.4 F (37.4 C)] 98.1 F (36.7 C) (02/21 0834) Pulse Rate:  [74-88] 86 (02/21 0945) Resp:  [16-18] 18 (02/21 0834) BP: (89-127)/(47-61) 127/58 mmHg (02/21 0945) SpO2:  [94 %-100 %] 94 % (02/21 0834)  Intake/Output from previous day: 02/20 0701 - 02/21 0700 In: 720 [P.O.:720] Out: 800 [Urine:800] Intake/Output this shift: Total I/O In: -  Out: 300 [Urine:300]   Recent Labs  09/18/12 0541 09/19/12 0740  HGB 10.5* 9.8*    Recent Labs  09/18/12 0541 09/19/12 0740  WBC 8.7 9.4  RBC 3.59* 3.34*  HCT 30.7* 28.7*  PLT 124* 121*    Recent Labs  09/18/12 0541  NA 136  K 4.3  CL 100  CO2 26  BUN 25*  CREATININE 1.26  GLUCOSE 152*  CALCIUM 8.4    Recent Labs  09/17/12 1051  INR 1.13    Neurovascular intact  Assessment/Plan: 2 Days Post-Op Procedure(s) (LRB): Left TOTAL HIP ARTHROPLASTY ANTERIOR APPROACH (Left) Up with therapy  Discharge home  Hampton Cost C 09/19/2012, 11:36 AM

## 2012-09-19 NOTE — Progress Notes (Signed)
Patient ID: Randy [name], male   DOB: [birth year], 67 y.o.   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

## 2012-09-19 NOTE — Progress Notes (Signed)
Physical Therapy Treatment Patient Details Name: Randy [name] MRN: [id number] DOB: 01/20/1946 Today's Date: 09/19/2012 Time: 4098-1191 PT Time Calculation (min): 27 min  PT Assessment / Plan / Recommendation Comments on Treatment Session  Patient making great progress. Spoke with Dr. Ophelia Charter who may plan on discharging patient this afternoon    Follow Up Recommendations  Home health PT     Does the patient have the potential to tolerate intense rehabilitation     Barriers to Discharge        Equipment Recommendations  Rolling walker with 5" wheels    Recommendations for Other Services    Frequency 7X/week   Plan Discharge plan remains appropriate;Frequency remains appropriate    Precautions / Restrictions Precautions Precautions: None Precaution Comments: Per [name] on 09/11/12, pt with direct anterior hip precautions, with comments "no hip precautions" Restrictions Weight Bearing Restrictions: Yes LLE Weight Bearing: Weight bearing as tolerated   Pertinent Vitals/Pain     Mobility  Transfers Sit to Stand: 4: Min guard;From elevated surface;With upper extremity assist;From bed;From chair/3-in-1;With armrests Stand to Sit: 4: Min guard;With upper extremity assist;To chair/3-in-1;With armrests Details for Transfer Assistance: Cues for safe technique Ambulation/Gait Ambulation/Gait Assistance: 4: Min guard Ambulation Distance (Feet): 200 Feet Assistive device: Rolling walker Ambulation/Gait Assistance Details: Cues for posture and to smooth out gait Gait Pattern: Step-to pattern Gait velocity: WFL Stairs: Yes Stairs Assistance: 4: Min guard Stair Management Technique: Step to pattern;Forwards;Two rails Number of Stairs: 4    Exercises Total Joint Exercises Long Arc Quad: AROM;Left;10 reps   PT Diagnosis:    PT Problem List:   PT Treatment Interventions:     PT Goals Acute Rehab PT Goals PT Goal: Supine/Side to Sit - Progress: Progressing toward  goal PT Goal: Sit to Stand - Progress: Progressing toward goal PT Goal: Stand to Sit - Progress: Progressing toward goal PT Goal: Ambulate - Progress: Progressing toward goal PT Goal: Up/Down Stairs - Progress: Progressing toward goal PT Goal: Perform Home Exercise Program - Progress: Progressing toward goal Additional Goals PT Goal: Additional Goal #1 - Progress: Progressing toward goal  Visit Information  Last PT Received On: 09/19/12 Assistance Needed: +1    Subjective Data      Cognition  Cognition Overall Cognitive Status: Appears within functional limits for tasks assessed/performed Arousal/Alertness: Awake/alert Orientation Level: Appears intact for tasks assessed Behavior During Session: [facility] for tasks performed    Balance     End of Session PT - End of Session Equipment Utilized During Treatment: Gait belt Activity Tolerance: Patient tolerated treatment well Patient left: in chair;with call bell/phone within reach Nurse Communication: Mobility status   GP     Fredrich Birks 09/19/2012, 11:50 AM 09/19/2012 Fredrich Birks PTA [phone] pager [phone] office

## 2012-09-19 NOTE — Progress Notes (Signed)
Pt discharged to home accompanied by girlfriend. Discharge instructions and rx given and explained and pt stated understanding. Pts IV was removed. Pt left unit in a stable condition via wheelchair.

## 2012-09-22 NOTE — Discharge Summary (Signed)
Physician Discharge Summary  Patient ID: DELONTE MUSICH MRN: [id number] DOB/AGE: [birth year] 67 y.o.  Admit date: 09/17/2012 Discharge date: 09/22/2012  Admission Diagnoses:  Osteoarthritis of left hip  Discharge Diagnoses:  Principal Problem:   Osteoarthritis of left hip   Past Medical History  Diagnosis Date  . Hypertension   . Dyslipidemia   . Diabetes mellitus   . CAD (coronary artery disease)   . Systolic dysfunction   . Vitamin D deficiency   . ICD (implantable cardiac defibrillator) in place     2009-St. Jude device  . Shortness of breath   . Pacemaker   . Sleep apnea     started a sleep study but didn't completed, states PCP told him he has sleep  apnea, doesn't use CPAP  . GERD (gastroesophageal reflux disease)   . Neuromuscular disorder     neuropathy- in hands  . Arthritis     hip, hands- Osteoarthritis      Surgeries: Procedure(s): Left TOTAL HIP ARTHROPLASTY ANTERIOR APPROACH on 09/17/2012   Consultants (if any):  none  Discharged Condition: Improved  Hospital Course: CONSTANTINOS KREMPASKY is an 67 y.o. male who was admitted 09/17/2012 with a diagnosis of Osteoarthritis of left hip and went to the operating room on 09/17/2012 and underwent the above named procedures.    He was given perioperative antibiotics:  Anti-infectives   Start     Dose/Rate Route Frequency Ordered Stop   09/17/12 1930  ceFAZolin (ANCEF) IVPB 1 g/50 mL premix     1 g 100 mL/hr over 30 Minutes Intravenous Every 6 hours 09/17/12 1830 09/18/12 0227   09/17/12 0600  ceFAZolin (ANCEF) IVPB 2 g/50 mL premix     2 g 100 mL/hr over 30 Minutes Intravenous On call to O.R. 09/16/12 1406 09/17/12 1245    .  He was given sequential compression devices, early ambulation, and aspirin for DVT prophylaxis.  He benefited maximally from the hospital stay and there were no complications.    Recent vital signs:  Filed Vitals:   09/19/12 1347  BP: 95/56  Pulse: 84  Temp: 98.4 F (36.9 C)  Resp:  18    Recent laboratory studies:  Lab Results  Component Value Date   HGB 9.8* 09/19/2012   HGB 10.5* 09/18/2012   HGB 14.0 09/15/2012   Lab Results  Component Value Date   WBC 9.4 09/19/2012   PLT 121* 09/19/2012   Lab Results  Component Value Date   INR 1.13 09/17/2012   Lab Results  Component Value Date   NA 136 09/18/2012   K 4.3 09/18/2012   CL 100 09/18/2012   CO2 26 09/18/2012   BUN 25* 09/18/2012   CREATININE 1.26 09/18/2012   GLUCOSE 152* 09/18/2012    Discharge Medications:     Medication List    TAKE these medications       aspirin 81 MG tablet  Take 81 mg by mouth at bedtime.     carvedilol 6.25 MG tablet  Commonly known as:  COREG  Take 6.25 mg by mouth 2 (two) times daily with a meal.     glipiZIDE 5 MG tablet  Commonly known as:  GLUCOTROL  Take 5 mg by mouth 2 (two) times daily before a meal.     lisinopril 20 MG tablet  Commonly known as:  PRINIVIL,ZESTRIL  Take 20 mg by mouth every other day.     lovastatin 20 MG tablet  Commonly known as:  MEVACOR  Take 80  mg by mouth at bedtime.     metFORMIN 500 MG tablet  Commonly known as:  GLUCOPHAGE  Take 500 mg by mouth 2 (two) times daily with a meal.     methocarbamol 500 MG tablet  Commonly known as:  ROBAXIN  Take 1 tablet (500 mg total) by mouth every 6 (six) hours as needed (spasms).     niacin 500 MG CR tablet  Commonly known as:  NIASPAN  Take 500 mg by mouth at bedtime.     oxyCODONE-acetaminophen 5-325 MG per tablet  Commonly known as:  ROXICET  Take 1 tablet by mouth every 4 (four) hours as needed for pain.     Tamsulosin HCl 0.4 MG Caps  Commonly known as:  FLOMAX  Take 0.4 mg by mouth every evening.        Diagnostic Studies: Dg Hip Operative Left  09/17/2012  *RADIOLOGY REPORT*  Clinical Data: Left total hip arthroplasty  OPERATIVE LEFT HIP  Comparison: None.  Findings: A left total hip arthroplasty is in place.  Anatomic alignment of the osseous and prosthetic structures.  No  breakage or loosening of the hardware.  IMPRESSION: Left total hip arthroplasty anatomically aligned.   Original Report Authenticated By: Jolaine Click, M.D.     Disposition: 01-Home or Self Care      Discharge Orders   Future Appointments Provider Department Dept Phone   12/01/2012 10:05 AM [facility] Main Office [place]) [phone]   Future Orders Complete By Expires     Change dressing  As directed      Keep hip incision dry for 5 days post op then may wet while bathing. Therapy daily . Call if fever or chills or increased drainage. Go to ER if acutely short of breath or call for ambulance. Return for follow up in 2 weeks. May full weight bear on the surgical leg unless told otherwise. In house walking for first 2 weeks.    Follow-up Information   Follow up with Eldred Manges, MD.   Contact information:   [address] [place] [address] [phone]        Signed: Wende Neighbors 09/22/2012, 4:50 PM

## 2012-10-01 ENCOUNTER — Encounter: Payer: Self-pay | Admitting: *Deleted

## 2012-10-07 ENCOUNTER — Other Ambulatory Visit: Payer: Self-pay | Admitting: Family Medicine

## 2012-12-01 ENCOUNTER — Encounter: Payer: Self-pay | Admitting: Internal Medicine

## 2012-12-01 ENCOUNTER — Ambulatory Visit (INDEPENDENT_AMBULATORY_CARE_PROVIDER_SITE_OTHER): Payer: Medicare Other | Admitting: *Deleted

## 2012-12-01 ENCOUNTER — Other Ambulatory Visit: Payer: Self-pay | Admitting: Internal Medicine

## 2012-12-01 DIAGNOSIS — I5022 Chronic systolic (congestive) heart failure: Secondary | ICD-10-CM

## 2012-12-01 DIAGNOSIS — Z95810 Presence of automatic (implantable) cardiac defibrillator: Secondary | ICD-10-CM

## 2012-12-01 LAB — REMOTE ICD DEVICE
DEV-0020ICD: NEGATIVE
RV LEAD AMPLITUDE: 4.8 mv
RV LEAD IMPEDENCE ICD: 390 Ohm
TZAT-0001SLOWVT: 1
TZAT-0004FASTVT: 8
TZAT-0004SLOWVT: 8
TZAT-0012SLOWVT: 200 ms
TZAT-0018FASTVT: NEGATIVE
TZON-0003FASTVT: 300 ms
TZON-0010FASTVT: 80 ms
TZST-0001FASTVT: 3
TZST-0001FASTVT: 5
TZST-0001SLOWVT: 3
TZST-0003FASTVT: 36 J
TZST-0003SLOWVT: 15 J
TZST-0003SLOWVT: 36 J
TZST-0003SLOWVT: 36 J

## 2012-12-05 ENCOUNTER — Other Ambulatory Visit: Payer: Self-pay | Admitting: Family Medicine

## 2012-12-18 ENCOUNTER — Encounter: Payer: Self-pay | Admitting: *Deleted

## 2013-01-06 ENCOUNTER — Encounter: Payer: Self-pay | Admitting: Family Medicine

## 2013-01-06 ENCOUNTER — Ambulatory Visit (INDEPENDENT_AMBULATORY_CARE_PROVIDER_SITE_OTHER): Payer: Medicare Other | Admitting: Family Medicine

## 2013-01-06 VITALS — BP 110/62 | HR 68 | Temp 97.8°F | Wt 198.0 lb

## 2013-01-06 DIAGNOSIS — E785 Hyperlipidemia, unspecified: Secondary | ICD-10-CM

## 2013-01-06 DIAGNOSIS — M545 Low back pain, unspecified: Secondary | ICD-10-CM | POA: Insufficient documentation

## 2013-01-06 DIAGNOSIS — Z125 Encounter for screening for malignant neoplasm of prostate: Secondary | ICD-10-CM

## 2013-01-06 DIAGNOSIS — E119 Type 2 diabetes mellitus without complications: Secondary | ICD-10-CM

## 2013-01-06 LAB — COMPREHENSIVE METABOLIC PANEL
Albumin: 4 g/dL (ref 3.5–5.2)
Alkaline Phosphatase: 50 U/L (ref 39–117)
BUN: 20 mg/dL (ref 6–23)
CO2: 27 mEq/L (ref 19–32)
GFR: 53.7 mL/min — ABNORMAL LOW (ref >60.00)
Glucose, Bld: 96 mg/dL (ref 70–99)
Potassium: 4.6 mEq/L (ref 3.5–5.1)
Total Bilirubin: 0.5 mg/dL (ref 0.3–1.2)

## 2013-01-06 LAB — LDL CHOLESTEROL, DIRECT: Direct LDL: 137.7 mg/dL

## 2013-01-06 LAB — LIPID PANEL
Cholesterol: 217 mg/dL — ABNORMAL HIGH (ref 0–200)
Total CHOL/HDL Ratio: 7
Triglycerides: 246 mg/dL — ABNORMAL HIGH (ref 0.0–149.0)

## 2013-01-06 MED ORDER — TRAMADOL HCL 50 MG PO TABS: 50.0000 mg | ORAL_TABLET | Freq: Four times a day (QID) | ORAL | Status: DC | PRN

## 2013-01-06 NOTE — Patient Instructions (Addendum)
Good to see you. We will call you with your lab results.   

## 2013-01-06 NOTE — Progress Notes (Signed)
Subjective:    Patient ID: Randy [name], male    DOB: 1945-10-20, 67 y.o.   MRN: [id number]  HPI  67 yo pleasant male here for follow up.  Feeling better since his hip replacement in 08/2012.  DM- on glipizide 5 mg daily.  Checks fasting CBGs three times weekly- usually 120s-130s. No recent h/o hypoglycemia.  On Lisinopril 20 mg daily.  Lab Results  Component Value Date   HGBA1C 6.7* 01/16/2012   He is followed by cardiology.  Takes Mevacor 20 mg daily along with Niaspan.  Due for labs. Lab Results  Component Value Date   CHOL 155 06/19/2011   HDL 43.60 06/19/2011   LDLCALC 78 06/19/2011   LDLDIRECT 79.6 02/14/2010   TRIG 166.0* 06/19/2011   CHOLHDL 4 06/19/2011      Patient Active Problem List  Diagnosis  . DM  . Unspecified vitamin D deficiency  . HYPERLIPIDEMIA  . HYPERTENSION  . MYOCARDIAL INFARCTION  . IMPOTENCE OF ORGANIC ORIGIN  . HYPERSOMNIA UNSPECIFIED  . AUTOMATIC IMPLANTABLE CARDIAC DEFIBRILLATOR SITU  . Chronic systolic heart failure  . Muscle cramps  . Dehydration  . Acute renal failure  . Metabolic acidosis   Past Medical History  Diagnosis Date  . Hypertension   . Dyslipidemia   . Diabetes mellitus   . CAD (coronary artery disease)   . Systolic dysfunction   . Vitamin d deficiency    Past Surgical History  Procedure Date  . Back surgery   . Left carpal tunnel release   . Left rotator cuff surgery    History  Substance Use Topics  . Smoking status: Former Smoker    Types: Cigarettes    Quit date: 07/30/1984  . Smokeless tobacco: Not on file  . Alcohol Use: No   Family History  Problem Relation Age of Onset  . Heart attack Father 31  . Diabetes Brother    No Known Allergies Current Outpatient Prescriptions on File Prior to Visit  Medication Sig Dispense Refill  . acetaminophen (TYLENOL) 325 MG tablet Take 2 tablets (650 mg total) by mouth every 6 (six) hours as needed for pain.      Marland Kitchen aspirin 81 MG tablet Take 81 mg by mouth  daily.      Marland Kitchen glipiZIDE (GLUCOTROL) 5 MG tablet TAKE ONE TABLET BY MOUTH TWICE DAILY  60 tablet  6  . lovastatin (MEVACOR) 20 MG tablet TAKE FOUR TABLETS BY MOUTH EVERY DAY  120 tablet  6  . metFORMIN (GLUCOPHAGE) 500 MG tablet TAKE ONE TABLET BY MOUTH TWICE DAILY  60 tablet  6  . NIASPAN 500 MG CR tablet TAKE ONE TABLET BY MOUTH AT BEDTIME  30 each  6  . Tamsulosin HCl (FLOMAX) 0.4 MG CAPS TAKE ONE CAPSULE BY MOUTH EVERY DAY IN THE EVENING  30 capsule  60   The PMH, PSH, Social History, Family History, Medications, and allergies have been reviewed in [facility], and have been updated if relevant.    Review of Systems    See HPI Objective:   Physical Exam BP 110/62  Pulse 68  Temp(Src) 97.8 F (36.6 C)  Wt 198 lb (89.812 kg)  BMI 29.23 kg/m2 General:  pleasant male in NAD Eyes:  PERRL Ears:  External ear exam shows no significant lesions or deformities.  Otoscopic examination reveals clear canals, tympanic membranes are intact bilaterally without bulging, retraction, inflammation or discharge. Hearing is grossly normal bilaterally. Nose:  External nasal examination shows no deformity or inflammation.  Nasal mucosa are pink and moist without lesions or exudates. Mouth:  Oral mucosa and oropharynx without lesions or exudates.  Teeth in good repair. Neck:  no carotid bruit or thyromegaly no cervical or supraclavicular lymphadenopathy  Lungs:  Normal respiratory effort, chest expands symmetrically. Lungs are clear to auscultation, no crackles or wheezes. Heart:  Normal rate and regular rhythm. S1 and S2 normal without gallop, murmur, click, rub or other extra sounds. Pulses:  R and L posterior tibial pulses are full and equal bilaterally  Extremities:  no edema     Assessment & Plan:   1. DM Stable.  Due for a1c. - Hemoglobin A1c   2. HYPERLIPIDEMIA Continue Mevacor.  Recheck labs. - Comprehensive metabolic panel - Lipid Panel  3. Screening for prostate cancer  - PSA

## 2013-01-16 ENCOUNTER — Other Ambulatory Visit: Payer: Self-pay | Admitting: Family Medicine

## 2013-01-16 DIAGNOSIS — R7989 Other specified abnormal findings of blood chemistry: Secondary | ICD-10-CM

## 2013-03-02 ENCOUNTER — Ambulatory Visit (INDEPENDENT_AMBULATORY_CARE_PROVIDER_SITE_OTHER): Payer: Medicare Other | Admitting: *Deleted

## 2013-03-02 DIAGNOSIS — I5022 Chronic systolic (congestive) heart failure: Secondary | ICD-10-CM

## 2013-03-02 DIAGNOSIS — Z95810 Presence of automatic (implantable) cardiac defibrillator: Secondary | ICD-10-CM

## 2013-03-03 LAB — REMOTE ICD DEVICE
BRDY-0002RV: 40 {beats}/min
DEV-0020ICD: NEGATIVE
HV IMPEDENCE: 691 Ohm
TZAT-0001FASTVT: 1
TZAT-0004FASTVT: 8
TZAT-0004SLOWVT: 8
TZAT-0012FASTVT: 200 ms
TZAT-0013FASTVT: 1
TZAT-0013SLOWVT: 3
TZAT-0020FASTVT: 1 ms
TZON-0003SLOWVT: 340 ms
TZON-0005FASTVT: 6
TZON-0010SLOWVT: 80 ms
TZST-0001FASTVT: 5
TZST-0001SLOWVT: 2
TZST-0001SLOWVT: 3
TZST-0001SLOWVT: 5
TZST-0003FASTVT: 25 J
TZST-0003FASTVT: 36 J
TZST-0003FASTVT: 36 J
TZST-0003SLOWVT: 36 J

## 2013-03-09 ENCOUNTER — Other Ambulatory Visit: Payer: Self-pay | Admitting: *Deleted

## 2013-03-09 MED ORDER — TAMSULOSIN HCL 0.4 MG PO CAPS: 0.4000 mg | ORAL_CAPSULE | Freq: Every evening | ORAL | Status: DC

## 2013-03-13 ENCOUNTER — Other Ambulatory Visit: Payer: Self-pay | Admitting: *Deleted

## 2013-03-13 ENCOUNTER — Other Ambulatory Visit: Payer: Self-pay

## 2013-03-13 MED ORDER — LOVASTATIN 20 MG PO TABS: 80.0000 mg | ORAL_TABLET | Freq: Every day | ORAL | Status: DC

## 2013-03-13 MED ORDER — GLIPIZIDE 5 MG PO TABS: 5.0000 mg | ORAL_TABLET | Freq: Two times a day (BID) | ORAL | Status: DC

## 2013-03-13 MED ORDER — TAMSULOSIN HCL 0.4 MG PO CAPS: 0.4000 mg | ORAL_CAPSULE | Freq: Every evening | ORAL | Status: DC

## 2013-03-13 MED ORDER — METFORMIN HCL 500 MG PO TABS: 500.0000 mg | ORAL_TABLET | Freq: Two times a day (BID) | ORAL | Status: DC

## 2013-03-13 NOTE — Telephone Encounter (Signed)
Left vm for pt to return call  

## 2013-03-13 NOTE — Telephone Encounter (Signed)
Pt saw renal doctor is it ok for pt to take lovastatin 80 mg daily.Please advise.

## 2013-03-13 NOTE — Telephone Encounter (Signed)
Let's decrease to 40 mg daily instead.  With renal issues, higher dose can cause more myopathy.

## 2013-03-17 MED ORDER — LOVASTATIN 20 MG PO TABS: ORAL_TABLET | ORAL | Status: DC

## 2013-03-17 NOTE — Telephone Encounter (Signed)
Advised patient not to take more than 40 mg's of lovastatin daily ( two 20 mg pills).  Pt stated he understood.

## 2013-03-20 ENCOUNTER — Encounter: Payer: Self-pay | Admitting: *Deleted

## 2013-04-06 ENCOUNTER — Other Ambulatory Visit: Payer: Self-pay | Admitting: *Deleted

## 2013-04-06 MED ORDER — CARVEDILOL 6.25 MG PO TABS: ORAL_TABLET | ORAL | Status: DC

## 2013-04-23 ENCOUNTER — Ambulatory Visit (INDEPENDENT_AMBULATORY_CARE_PROVIDER_SITE_OTHER): Payer: Medicare Other

## 2013-04-23 DIAGNOSIS — Z23 Encounter for immunization: Secondary | ICD-10-CM

## 2013-04-29 ENCOUNTER — Encounter: Payer: Self-pay | Admitting: Internal Medicine

## 2013-05-25 ENCOUNTER — Ambulatory Visit: Payer: Medicare Other | Admitting: Family Medicine

## 2013-06-01 ENCOUNTER — Ambulatory Visit: Payer: Medicare Other | Admitting: Family Medicine

## 2013-06-02 ENCOUNTER — Encounter: Payer: Self-pay | Admitting: Internal Medicine

## 2013-06-02 ENCOUNTER — Ambulatory Visit (INDEPENDENT_AMBULATORY_CARE_PROVIDER_SITE_OTHER): Payer: Medicare Other | Admitting: Internal Medicine

## 2013-06-02 VITALS — BP 122/72 | HR 65 | Ht 69.0 in | Wt 191.4 lb

## 2013-06-02 DIAGNOSIS — Z95810 Presence of automatic (implantable) cardiac defibrillator: Secondary | ICD-10-CM

## 2013-06-02 DIAGNOSIS — I428 Other cardiomyopathies: Secondary | ICD-10-CM

## 2013-06-02 DIAGNOSIS — I5022 Chronic systolic (congestive) heart failure: Secondary | ICD-10-CM

## 2013-06-02 LAB — ICD DEVICE OBSERVATION
BRDY-0002RV: 40 {beats}/min
CHARGE TIME: 11.6 s
DEV-0020ICD: NEGATIVE
RV LEAD AMPLITUDE: 5.1 mv
RV LEAD IMPEDENCE ICD: 360 Ohm
RV LEAD THRESHOLD: 1.25 V
TZAT-0001FASTVT: 1
TZAT-0001SLOWVT: 1
TZAT-0018FASTVT: NEGATIVE
TZAT-0019SLOWVT: 7.5 V
TZAT-0020SLOWVT: 1 ms
TZON-0003FASTVT: 300 ms
TZON-0004SLOWVT: 24
TZON-0005SLOWVT: 6
TZON-0010FASTVT: 80 ms
TZON-0010SLOWVT: 80 ms
TZST-0001FASTVT: 2
TZST-0001FASTVT: 4
TZST-0001SLOWVT: 4
TZST-0003FASTVT: 25 J
TZST-0003FASTVT: 36 J
TZST-0003FASTVT: 36 J
TZST-0003SLOWVT: 15 J
TZST-0003SLOWVT: 36 J

## 2013-06-02 NOTE — Assessment & Plan Note (Signed)
His symptoms are class II. He will maintain a low-sodium diet. He'll continue his current medical therapy.

## 2013-06-02 NOTE — Assessment & Plan Note (Signed)
His St. Jude ICD is working normally. Will recheck in several months.  

## 2013-06-02 NOTE — Progress Notes (Signed)
HPI [name] returns today for followup. He is a pleasant 67 yo man with a h/o HTN, chronic systolic CHF, s/p ICD implant. In the interim he has been stable. He has chronic exertional dyspnea which is class II. He denies chest pain or syncope. No ICD shocks.  No Known Allergies   Current Outpatient Prescriptions  Medication Sig Dispense Refill  . aspirin 81 MG tablet Take 81 mg by mouth at bedtime.       . carvedilol (COREG) 6.25 MG tablet TAKE ONE TABLET BY MOUTH TWICE DAILY  60 tablet  3  . glipiZIDE (GLUCOTROL) 5 MG tablet Take 1 tablet (5 mg total) by mouth 2 (two) times daily before a meal.  60 tablet  5  . lisinopril (PRINIVIL,ZESTRIL) 5 MG tablet Take 5 mg by mouth daily.      Marland Kitchen lovastatin (MEVACOR) 20 MG tablet Take 2 tablets by mouth at bedtime.  60 tablet  5  . methocarbamol (ROBAXIN) 500 MG tablet Take 1 tablet (500 mg total) by mouth every 6 (six) hours as needed (spasms).  40 tablet  1  . niacin (NIASPAN) 500 MG CR tablet TAKE ONE TABLET BY MOUTH EVERY DAY AT BEDTIME  30 tablet  5  . tamsulosin (FLOMAX) 0.4 MG CAPS capsule Take 1 capsule (0.4 mg total) by mouth every evening.  30 capsule  5  . traMADol (ULTRAM) 50 MG tablet Take 1 tablet (50 mg total) by mouth every 6 (six) hours as needed for pain.  30 tablet  0   No current facility-administered medications for this visit.     Past Medical History  Diagnosis Date  . Hypertension   . Dyslipidemia   . Diabetes mellitus   . CAD (coronary artery disease)   . Systolic dysfunction   . Vitamin D deficiency   . ICD (implantable cardiac defibrillator) in place     2009-St. Jude device  . Shortness of breath   . Pacemaker   . Sleep apnea     started a sleep study but didn't completed, states PCP told him he has sleep  apnea, doesn't use CPAP  . GERD (gastroesophageal reflux disease)   . Neuromuscular disorder     neuropathy- in hands  . Arthritis     hip, hands- Osteoarthritis      ROS:   All systems  reviewed and negative except as noted in the HPI.   Past Surgical History  Procedure Laterality Date  . Left carpal tunnel release    . Left rotator cuff surgery    . Back surgery      lumbar & cervical fusion   . Insert / replace / remove pacemaker    . Total hip arthroplasty Left 09/17/2012    Dr Ophelia Charter  . Total hip arthroplasty Left 09/17/2012    Procedure: Left TOTAL HIP ARTHROPLASTY ANTERIOR APPROACH;  Surgeon: Eldred Manges, MD;  Location: [facility] OR;  Service: Orthopedics;  Laterality: Left;     Family History  Problem Relation Age of Onset  . Heart attack Father 5  . Diabetes Brother      History   Social History  . Marital Status: Single    Spouse Name: N/A    Number of Children: 2  . Years of Education: N/A   Occupational History  . retired     Medical sales representative   Social History Main Topics  . Smoking status: Former Smoker    Types: Cigarettes  Quit date: 07/30/1984  . Smokeless tobacco: Never Used  . Alcohol Use: No  . Drug Use: No  . Sexual Activity: Not on file   Other Topics Concern  . Not on file   Social History Narrative   Lives with friend           BP 122/72  Pulse 65  Ht 5\' 9"  (1.753 m)  Wt 191 lb 6.4 oz (86.818 kg)  BMI 28.25 kg/m2  Physical Exam:  Well appearing 67 year old man,NAD HEENT: Unremarkable Neck:  No JVD, no thyromegally Back:  No CVA tenderness Lungs:  Clear with no wheezes, rales, or rhonchi. HEART:  Regular rate rhythm, no murmurs, no rubs, no clicks Abd:  soft, positive bowel sounds, no organomegally, no rebound, no guarding Ext:  2 plus pulses, no edema, no cyanosis, no clubbing Skin:  No rashes no nodules Neuro:  CN II through XII intact, motor grossly intact  DEVICE  Normal device function.  See PaceArt for details.   Assess/Plan:

## 2013-06-02 NOTE — Patient Instructions (Addendum)
Remote monitoring is used to monitor your  ICD from home. This monitoring reduces the number of office visits required to check your device to one time per year. It allows us to keep an eye on the functioning of your device to ensure it is working properly. You are scheduled for a device check from home on 09-03-2013. You may send your transmission at any time that day. If you have a wireless device, the transmission will be sent automatically. After your physician reviews your transmission, you will receive a postcard with your next transmission date.  Your physician recommends that you schedule a follow-up appointment in: 1 year with Dr Taylor   

## 2013-06-08 ENCOUNTER — Encounter: Payer: Self-pay | Admitting: Internal Medicine

## 2013-06-23 ENCOUNTER — Other Ambulatory Visit: Payer: Self-pay | Admitting: *Deleted

## 2013-06-23 MED ORDER — NIACIN ER (ANTIHYPERLIPIDEMIC) 500 MG PO TBCR: EXTENDED_RELEASE_TABLET | ORAL | Status: DC

## 2013-07-01 ENCOUNTER — Other Ambulatory Visit: Payer: Self-pay | Admitting: Orthopaedic Surgery

## 2013-07-01 DIAGNOSIS — M549 Dorsalgia, unspecified: Secondary | ICD-10-CM

## 2013-07-07 ENCOUNTER — Ambulatory Visit
Admission: RE | Admit: 2013-07-07 | Discharge: 2013-07-07 | Disposition: A | Discharge status: Home / Self Care | Payer: Medicare Other | Source: Ambulatory Visit | Attending: Orthopaedic Surgery | Admitting: Orthopaedic Surgery

## 2013-07-07 VITALS — BP 122/65 | HR 60

## 2013-07-07 DIAGNOSIS — M545 Low back pain: Secondary | ICD-10-CM

## 2013-07-07 DIAGNOSIS — M549 Dorsalgia, unspecified: Secondary | ICD-10-CM

## 2013-07-07 MED ORDER — DIAZEPAM 5 MG PO TABS
5.0000 mg | ORAL_TABLET | Freq: Once | ORAL | Status: AC
  Administered 2013-07-07: 5 mg via ORAL

## 2013-07-07 MED ORDER — IOHEXOL 180 MG/ML  SOLN
15.0000 mL | Freq: Once | INTRAMUSCULAR | Status: AC | PRN
  Administered 2013-07-07: 15 mL via INTRATHECAL

## 2013-07-07 NOTE — Progress Notes (Signed)
Pt states he has been off ultram since Thanksgiving. Discharge instructions explained to pt,

## 2013-07-09 ENCOUNTER — Other Ambulatory Visit (HOSPITAL_COMMUNITY): Payer: Self-pay | Admitting: Orthopaedic Surgery

## 2013-07-10 ENCOUNTER — Encounter (HOSPITAL_COMMUNITY): Payer: Self-pay | Admitting: Pharmacy Technician

## 2013-07-13 ENCOUNTER — Encounter (HOSPITAL_COMMUNITY)
Admission: RE | Admit: 2013-07-13 | Discharge: 2013-07-13 | Disposition: A | Discharge status: Home / Self Care | Payer: Medicare Other | Source: Ambulatory Visit | Attending: Orthopaedic Surgery | Admitting: Orthopaedic Surgery

## 2013-07-13 ENCOUNTER — Encounter (HOSPITAL_COMMUNITY): Payer: Self-pay

## 2013-07-13 DIAGNOSIS — Z01812 Encounter for preprocedural laboratory examination: Secondary | ICD-10-CM | POA: Insufficient documentation

## 2013-07-13 DIAGNOSIS — Z01810 Encounter for preprocedural cardiovascular examination: Secondary | ICD-10-CM | POA: Insufficient documentation

## 2013-07-13 DIAGNOSIS — Z01818 Encounter for other preprocedural examination: Secondary | ICD-10-CM | POA: Insufficient documentation

## 2013-07-13 HISTORY — DX: Anemia, unspecified: D64.9

## 2013-07-13 LAB — COMPREHENSIVE METABOLIC PANEL
AST: 18 U/L (ref 0–37)
Albumin: 4.2 g/dL (ref 3.5–5.2)
BUN: 22 mg/dL (ref 6–23)
Calcium: 9.6 mg/dL (ref 8.4–10.5)
Chloride: 101 mEq/L (ref 96–112)
Creatinine, Ser: 1.18 mg/dL (ref 0.50–1.35)
GFR calc non Af Amer: 62 mL/min — ABNORMAL LOW (ref >90)
Potassium: 4.9 mEq/L (ref 3.5–5.1)
Total Bilirubin: 0.5 mg/dL (ref 0.3–1.2)

## 2013-07-13 LAB — CBC
HCT: 39.7 % (ref 39.0–52.0)
MCH: 29.8 pg (ref 26.0–34.0)
MCV: 87.1 fL (ref 78.0–100.0)
Platelets: 150 10*3/uL (ref 150–400)
RDW: 12.9 % (ref 11.5–15.5)
WBC: 7 10*3/uL (ref 4.0–10.5)

## 2013-07-13 LAB — PROTIME-INR
INR: 1.05 (ref 0.00–1.49)
Prothrombin Time: 13.5 seconds (ref 11.6–15.2)

## 2013-07-13 LAB — SURGICAL PCR SCREEN: MRSA, PCR: NEGATIVE

## 2013-07-13 NOTE — Progress Notes (Signed)
Form faxed to dr Ladona Ridgel. And brian at [facility] Jude notiied re ICD/PACEMAKER

## 2013-07-13 NOTE — Pre-Procedure Instructions (Signed)
Randy [name]  07/13/2013   Your procedure is scheduled on:  07/17/13  Report to [facility]  2 * 3 at 8 AM.  Call this number if you have problems the morning of surgery: [phone]   Remember:   Do not eat food or drink liquids after midnight.   Take these medicines the morning of surgery with A SIP OF WATER: carvedilol,flomax   Do not wear jewelry, make-up or nail polish.  Do not wear lotions, powders, or perfumes. You may wear deodorant.  Do not shave 48 hours prior to surgery. Men may shave face and neck.  Do not bring valuables to the hospital.  [facility] is not responsible                  for any belongings or valuables.               Contacts, dentures or bridgework may not be worn into surgery.  Leave suitcase in the car. After surgery it may be brought to your room.  For patients admitted to the hospital, discharge time is determined by your                treatment team.               Patients discharged the day of surgery will not be allowed to drive  home.  Name and phone number of your driver: family  Special Instructions: Shower using CHG 2 nights before surgery and the night before surgery.  If you shower the day of surgery use CHG.  Use special wash - you have one bottle of CHG for all showers.  You should use approximately 1/3 of the bottle for each shower.   Please read over the following fact sheets that you were given: Pain Booklet, Coughing and Deep Breathing, MRSA Information and Surgical Site Infection Prevention

## 2013-07-14 NOTE — Progress Notes (Signed)
Peri-operative orders received from Dr. Ladona Ridgel and placed on chart.

## 2013-07-15 NOTE — H&P (Signed)
[facility]   A Division of [employer]   [address], [place], [place] [address] Telephone: [phone]  Fax: [phone]     PATIENT: Randy [name], Randy [name]   MR#: [id number]  DOB: 09/22/1945       The patient returns with persistent problems with back pain and radicular left leg pain that radiates down the dorsum of his left foot.  He has weakness in his leg.  He states the pain has been severe.  He had previous instrumented fusion at L3-4 with cage back in 2000 which is 14 years ago.  He has had left total hip arthroplasty, doing well.  He states after he stands for a few minutes the left leg gets weak, painful, and give way.  He denies any right leg pain.     CURRENT MEDICATIONS:   His medications are reviewed.  He is on Carvedilol 6.25 mg 1 p.o. daily, glipizide 5 mg daily, lovastatin 20 mg 1 a day, lisinopril 5 mg 1 a day, Niacin-ER 500 mg daily and tamsulosin 0.4 mg daily.     ALLERGIES:    No previous allergies.   PAST SURGICAL HISTORY:   Previous hip surgery February 2014.     FAMILY HISTORY:   Positive for diabetes, hypertension.   SOCIAL HISTORY:   The patient is separated.  He has a history of diabetes, medication controlled for greater than 10 years.  He is not on insulin.   REVIEW OF SYSTEMS:   Positive for hypertension, sleep apnea.  He does not use CPAP and also pacemaker or defibrillator.  Total hip arthroplasty was in 09/17/2012 left hip anterior approach.   The patient's symptoms have persisted.  He has had weakness, positive tension signs, has not improved with anti-inflammatories, pain medication.  Myelogram CT scan was performed due to the defibrillator which showed which shows an L4-5 extradural cyst which is large causing significant lateral recess compression and measures 11 x 9 mm with intermittent density consistent with a hemorrhage shift.  He has severe lateral recess stenosis at L4-5 on the left only.  He does have some  moderate foraminal narrowing, was symptomatic prior to recent onset of his severe symptoms. We discussed options.  He would like to proceed with operative lateral recess decompression, removal of the extra dural intraspinal facet cyst causing radiculopathy.     PHYSICAL EXAMINATION:  The patient is alert and oriented, WD, WN, NAD.  Extraocular movements intact.  He is in mild-to-moderate distress, comfortable in the sitting position, worse with standing and severe pain with ambulation.  The patient is 5 feet 9 inches, weight is 195.  Extraocular movements intact.  He has palpable pacemaker defibrillator left upper chest.  Lungs are clear.  Heart:  Regular rate and rhythm.  Abdomen is soft and nontender.  No pain with hip range of motion.  Left hip incision is well-healed.  Lumbar midline incision is well-healed.  One Band-Aid is present for his myelogram CT site.   The patient has some anterior tib EHL weakness on the left.  Gastroc soleus is strong.  Distal pulses are intact.  No rash over exposed skin.  No pedal edema.   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     PLAN:  Procedure discussed.  Risk of bleeding, infection, dural tear, spinal fluid leak, dural repair, reoperation, potential for progression of arthritis with recurrence of the cyst, possible need for later  fusion if he develops instability all discussed.  He request we proceed.   For additional information please see handwritten notes, reports, orders and prescriptions in this chart.      Mark C. Ophelia Charter, M.D.    Auto-Authenticated by Veverly Fells. Ophelia Charter, M.D.

## 2013-07-16 MED ORDER — CEFAZOLIN SODIUM-DEXTROSE 2-3 GM-% IV SOLR
2.0000 g | INTRAVENOUS | Status: AC
  Administered 2013-07-17: 2 g via INTRAVENOUS
  Filled 2013-07-16: qty 50

## 2013-07-17 ENCOUNTER — Encounter (HOSPITAL_COMMUNITY): Payer: Medicare Other | Admitting: Anesthesiology

## 2013-07-17 ENCOUNTER — Inpatient Hospital Stay (HOSPITAL_COMMUNITY): Payer: Medicare Other | Admitting: Anesthesiology

## 2013-07-17 ENCOUNTER — Encounter (HOSPITAL_COMMUNITY)
Admission: RE | Disposition: A | Discharge status: Home / Self Care | Payer: Self-pay | Source: Ambulatory Visit | Attending: Orthopaedic Surgery

## 2013-07-17 ENCOUNTER — Inpatient Hospital Stay (HOSPITAL_COMMUNITY)
Admission: RE | Admit: 2013-07-17 | Discharge: 2013-07-18 | DRG: 520 | Disposition: A | Discharge status: Home / Self Care | Payer: Medicare Other | Source: Ambulatory Visit | Attending: Orthopaedic Surgery | Admitting: Orthopaedic Surgery

## 2013-07-17 ENCOUNTER — Inpatient Hospital Stay (HOSPITAL_COMMUNITY): Payer: Medicare Other

## 2013-07-17 ENCOUNTER — Encounter (HOSPITAL_COMMUNITY): Payer: Self-pay | Admitting: Surgery

## 2013-07-17 DIAGNOSIS — Z95810 Presence of automatic (implantable) cardiac defibrillator: Secondary | ICD-10-CM

## 2013-07-17 DIAGNOSIS — I2510 Atherosclerotic heart disease of native coronary artery without angina pectoris: Secondary | ICD-10-CM | POA: Diagnosis present

## 2013-07-17 DIAGNOSIS — M7138 Other bursal cyst, other site: Secondary | ICD-10-CM | POA: Diagnosis present

## 2013-07-17 DIAGNOSIS — M7130 Other bursal cyst, unspecified site: Principal | ICD-10-CM | POA: Diagnosis present

## 2013-07-17 DIAGNOSIS — E119 Type 2 diabetes mellitus without complications: Secondary | ICD-10-CM | POA: Diagnosis present

## 2013-07-17 DIAGNOSIS — M48061 Spinal stenosis, lumbar region without neurogenic claudication: Secondary | ICD-10-CM | POA: Diagnosis present

## 2013-07-17 DIAGNOSIS — K219 Gastro-esophageal reflux disease without esophagitis: Secondary | ICD-10-CM | POA: Diagnosis present

## 2013-07-17 DIAGNOSIS — E785 Hyperlipidemia, unspecified: Secondary | ICD-10-CM | POA: Diagnosis present

## 2013-07-17 DIAGNOSIS — I10 Essential (primary) hypertension: Secondary | ICD-10-CM | POA: Diagnosis present

## 2013-07-17 HISTORY — PX: LUMBAR LAMINECTOMY: SHX95

## 2013-07-17 LAB — GLUCOSE, CAPILLARY
Glucose-Capillary: 106 mg/dL — ABNORMAL HIGH (ref 70–99)
Glucose-Capillary: 90 mg/dL (ref 70–99)
Glucose-Capillary: 93 mg/dL (ref 70–99)

## 2013-07-17 SURGERY — MICRODISCECTOMY LUMBAR LAMINECTOMY
Anesthesia: General | Site: Back | Laterality: Left

## 2013-07-17 MED ORDER — INSULIN ASPART 100 UNIT/ML ~~LOC~~ SOLN: 0.0000 [IU] | Freq: Every day | SUBCUTANEOUS | Status: DC

## 2013-07-17 MED ORDER — CARVEDILOL 6.25 MG PO TABS
6.2500 mg | ORAL_TABLET | Freq: Two times a day (BID) | ORAL | Status: DC
  Administered 2013-07-18: 6.25 mg via ORAL
  Filled 2013-07-17 (×4): qty 1

## 2013-07-17 MED ORDER — ONDANSETRON HCL 4 MG/2ML IJ SOLN
4.0000 mg | INTRAMUSCULAR | Status: DC | PRN
  Administered 2013-07-17: 4 mg via INTRAVENOUS
  Filled 2013-07-17: qty 2

## 2013-07-17 MED ORDER — ONDANSETRON HCL 4 MG/2ML IJ SOLN
INTRAMUSCULAR | Status: DC | PRN
  Administered 2013-07-17: 4 mg via INTRAVENOUS

## 2013-07-17 MED ORDER — FLEET ENEMA 7-19 GM/118ML RE ENEM: 1.0000 | ENEMA | Freq: Once | RECTAL | Status: AC | PRN

## 2013-07-17 MED ORDER — ALBUMIN HUMAN 5 % IV SOLN
INTRAVENOUS | Status: DC | PRN
  Administered 2013-07-17: 11:00:00 via INTRAVENOUS

## 2013-07-17 MED ORDER — EPHEDRINE SULFATE 50 MG/ML IJ SOLN
INTRAMUSCULAR | Status: DC | PRN
  Administered 2013-07-17 (×2): 10 mg via INTRAVENOUS
  Administered 2013-07-17 (×2): 5 mg via INTRAVENOUS
  Administered 2013-07-17: 10 mg via INTRAVENOUS

## 2013-07-17 MED ORDER — HYDROMORPHONE HCL PF 1 MG/ML IJ SOLN
INTRAMUSCULAR | Status: AC
  Filled 2013-07-17: qty 1

## 2013-07-17 MED ORDER — THROMBIN 20000 UNITS EX SOLR
CUTANEOUS | Status: AC
  Filled 2013-07-17: qty 20000

## 2013-07-17 MED ORDER — SODIUM CHLORIDE 0.9 % IJ SOLN: 3.0000 mL | INTRAMUSCULAR | Status: DC | PRN

## 2013-07-17 MED ORDER — GLIPIZIDE 5 MG PO TABS
5.0000 mg | ORAL_TABLET | Freq: Every day | ORAL | Status: DC
  Administered 2013-07-18: 5 mg via ORAL
  Filled 2013-07-17 (×2): qty 1

## 2013-07-17 MED ORDER — LACTATED RINGERS IV SOLN
INTRAVENOUS | Status: DC
Start: 2013-07-17 — End: 2013-07-17
  Administered 2013-07-17 (×2): via INTRAVENOUS

## 2013-07-17 MED ORDER — MORPHINE SULFATE 2 MG/ML IJ SOLN: 1.0000 mg | INTRAMUSCULAR | Status: DC | PRN

## 2013-07-17 MED ORDER — HYDROCODONE-ACETAMINOPHEN 5-325 MG PO TABS
1.0000 | ORAL_TABLET | ORAL | Status: DC | PRN
  Administered 2013-07-18: 2 via ORAL
  Filled 2013-07-17: qty 2

## 2013-07-17 MED ORDER — SODIUM CHLORIDE 0.9 % IV SOLN: 250.0000 mL | INTRAVENOUS | Status: DC

## 2013-07-17 MED ORDER — TAMSULOSIN HCL 0.4 MG PO CAPS
0.4000 mg | ORAL_CAPSULE | Freq: Every day | ORAL | Status: DC
  Administered 2013-07-18: 0.4 mg via ORAL
  Filled 2013-07-17: qty 1

## 2013-07-17 MED ORDER — LISINOPRIL 5 MG PO TABS
5.0000 mg | ORAL_TABLET | Freq: Every day | ORAL | Status: DC
  Administered 2013-07-18: 5 mg via ORAL
  Filled 2013-07-17 (×2): qty 1

## 2013-07-17 MED ORDER — HYDROMORPHONE HCL PF 1 MG/ML IJ SOLN
0.2500 mg | INTRAMUSCULAR | Status: DC | PRN
  Administered 2013-07-17 (×2): 0.5 mg via INTRAVENOUS

## 2013-07-17 MED ORDER — SENNOSIDES-DOCUSATE SODIUM 8.6-50 MG PO TABS: 1.0000 | ORAL_TABLET | Freq: Every evening | ORAL | Status: DC | PRN

## 2013-07-17 MED ORDER — MENTHOL 3 MG MT LOZG: 1.0000 | LOZENGE | OROMUCOSAL | Status: DC | PRN

## 2013-07-17 MED ORDER — BISACODYL 10 MG RE SUPP: 10.0000 mg | Freq: Every day | RECTAL | Status: DC | PRN

## 2013-07-17 MED ORDER — KETOROLAC TROMETHAMINE 30 MG/ML IJ SOLN
30.0000 mg | Freq: Three times a day (TID) | INTRAMUSCULAR | Status: AC
  Administered 2013-07-17 – 2013-07-18 (×3): 30 mg via INTRAVENOUS
  Filled 2013-07-17 (×4): qty 1

## 2013-07-17 MED ORDER — ROCURONIUM BROMIDE 100 MG/10ML IV SOLN
INTRAVENOUS | Status: DC | PRN
  Administered 2013-07-17: 40 mg via INTRAVENOUS

## 2013-07-17 MED ORDER — GLYCOPYRROLATE 0.2 MG/ML IJ SOLN
INTRAMUSCULAR | Status: DC | PRN
  Administered 2013-07-17: 0.4 mg via INTRAVENOUS

## 2013-07-17 MED ORDER — PROPOFOL 10 MG/ML IV BOLUS
INTRAVENOUS | Status: DC | PRN
  Administered 2013-07-17: 100 mg via INTRAVENOUS

## 2013-07-17 MED ORDER — FENTANYL CITRATE 0.05 MG/ML IJ SOLN
INTRAMUSCULAR | Status: DC | PRN
  Administered 2013-07-17: 150 ug via INTRAVENOUS
  Administered 2013-07-17 (×2): 50 ug via INTRAVENOUS

## 2013-07-17 MED ORDER — ACETAMINOPHEN 325 MG PO TABS: 650.0000 mg | ORAL_TABLET | ORAL | Status: DC | PRN

## 2013-07-17 MED ORDER — SODIUM CHLORIDE 0.9 % IJ SOLN: 3.0000 mL | Freq: Two times a day (BID) | INTRAMUSCULAR | Status: DC

## 2013-07-17 MED ORDER — METHOCARBAMOL 100 MG/ML IJ SOLN
500.0000 mg | Freq: Four times a day (QID) | INTRAMUSCULAR | Status: DC | PRN
  Filled 2013-07-17: qty 5

## 2013-07-17 MED ORDER — DOCUSATE SODIUM 100 MG PO CAPS
100.0000 mg | ORAL_CAPSULE | Freq: Two times a day (BID) | ORAL | Status: DC
  Administered 2013-07-17 – 2013-07-18 (×2): 100 mg via ORAL
  Filled 2013-07-17 (×3): qty 1

## 2013-07-17 MED ORDER — PHENOL 1.4 % MT LIQD: 1.0000 | OROMUCOSAL | Status: DC | PRN

## 2013-07-17 MED ORDER — MIDAZOLAM HCL 5 MG/5ML IJ SOLN
INTRAMUSCULAR | Status: DC | PRN
  Administered 2013-07-17: 1 mg via INTRAVENOUS

## 2013-07-17 MED ORDER — OXYCODONE-ACETAMINOPHEN 5-325 MG PO TABS: 1.0000 | ORAL_TABLET | ORAL | Status: DC | PRN

## 2013-07-17 MED ORDER — PANTOPRAZOLE SODIUM 40 MG IV SOLR
40.0000 mg | Freq: Every day | INTRAVENOUS | Status: DC
  Administered 2013-07-17: 40 mg via INTRAVENOUS
  Filled 2013-07-17 (×2): qty 40

## 2013-07-17 MED ORDER — NIACIN ER (ANTIHYPERLIPIDEMIC) 500 MG PO TBCR
500.0000 mg | EXTENDED_RELEASE_TABLET | Freq: Every day | ORAL | Status: DC
  Administered 2013-07-18: 500 mg via ORAL
  Filled 2013-07-17 (×2): qty 1

## 2013-07-17 MED ORDER — PHENYLEPHRINE HCL 10 MG/ML IJ SOLN
INTRAMUSCULAR | Status: DC | PRN
  Administered 2013-07-17 (×2): 80 ug via INTRAVENOUS
  Administered 2013-07-17: 40 ug via INTRAVENOUS
  Administered 2013-07-17: 80 ug via INTRAVENOUS

## 2013-07-17 MED ORDER — SIMVASTATIN 10 MG PO TABS
10.0000 mg | ORAL_TABLET | Freq: Every day | ORAL | Status: DC
  Filled 2013-07-17 (×2): qty 1

## 2013-07-17 MED ORDER — INSULIN ASPART 100 UNIT/ML ~~LOC~~ SOLN: 0.0000 [IU] | Freq: Three times a day (TID) | SUBCUTANEOUS | Status: DC

## 2013-07-17 MED ORDER — POTASSIUM CHLORIDE IN NACL 20-0.45 MEQ/L-% IV SOLN
INTRAVENOUS | Status: DC
  Administered 2013-07-18: 01:00:00 via INTRAVENOUS
  Filled 2013-07-17 (×4): qty 1000

## 2013-07-17 MED ORDER — ACETAMINOPHEN 650 MG RE SUPP: 650.0000 mg | RECTAL | Status: DC | PRN

## 2013-07-17 MED ORDER — LIDOCAINE HCL (CARDIAC) 20 MG/ML IV SOLN
INTRAVENOUS | Status: DC | PRN
  Administered 2013-07-17: 60 mg via INTRAVENOUS

## 2013-07-17 MED ORDER — NEOSTIGMINE METHYLSULFATE 1 MG/ML IJ SOLN
INTRAMUSCULAR | Status: DC | PRN
  Administered 2013-07-17: 3 mg via INTRAVENOUS

## 2013-07-17 MED ORDER — ASPIRIN EC 81 MG PO TBEC
81.0000 mg | DELAYED_RELEASE_TABLET | Freq: Every day | ORAL | Status: DC
  Administered 2013-07-18: 81 mg via ORAL
  Filled 2013-07-17 (×2): qty 1

## 2013-07-17 MED ORDER — 0.9 % SODIUM CHLORIDE (POUR BTL) OPTIME
TOPICAL | Status: DC | PRN
  Administered 2013-07-17: 1000 mL

## 2013-07-17 MED ORDER — CEFAZOLIN SODIUM 1-5 GM-% IV SOLN
1.0000 g | Freq: Once | INTRAVENOUS | Status: AC
  Administered 2013-07-17: 1 g via INTRAVENOUS
  Filled 2013-07-17: qty 50

## 2013-07-17 MED ORDER — ZOLPIDEM TARTRATE 5 MG PO TABS: 5.0000 mg | ORAL_TABLET | Freq: Every evening | ORAL | Status: DC | PRN

## 2013-07-17 MED ORDER — METHOCARBAMOL 500 MG PO TABS
500.0000 mg | ORAL_TABLET | Freq: Four times a day (QID) | ORAL | Status: DC | PRN
  Administered 2013-07-17: 500 mg via ORAL
  Filled 2013-07-17 (×2): qty 1

## 2013-07-17 MED ORDER — ARTIFICIAL TEARS OP OINT
TOPICAL_OINTMENT | OPHTHALMIC | Status: DC | PRN
  Administered 2013-07-17: 1 via OPHTHALMIC

## 2013-07-17 SURGICAL SUPPLY — 45 items
BUR ROUND FLUTED 4 SOFT TCH (BURR) ×2 IMPLANT
CLOTH BEACON ORANGE TIMEOUT ST (SAFETY) IMPLANT
CORDS BIPOLAR (ELECTRODE) ×2 IMPLANT
COVER SURGICAL LIGHT HANDLE (MISCELLANEOUS) ×2 IMPLANT
DERMABOND ADVANCED (GAUZE/BANDAGES/DRESSINGS) ×1
DERMABOND ADVANCED .7 DNX12 (GAUZE/BANDAGES/DRESSINGS) ×1 IMPLANT
DRAPE MICROSCOPE LEICA (MISCELLANEOUS) ×2 IMPLANT
DRAPE PROXIMA HALF (DRAPES) ×4 IMPLANT
DRSG EMULSION OIL 3X3 NADH (GAUZE/BANDAGES/DRESSINGS) IMPLANT
DRSG MEPILEX BORDER 4X4 (GAUZE/BANDAGES/DRESSINGS) ×2 IMPLANT
DRSG MEPILEX BORDER 4X8 (GAUZE/BANDAGES/DRESSINGS) IMPLANT
DURAPREP 26ML APPLICATOR (WOUND CARE) ×2 IMPLANT
ELECT REM PT RETURN 9FT ADLT (ELECTROSURGICAL) ×2
ELECTRODE REM PT RTRN 9FT ADLT (ELECTROSURGICAL) ×1 IMPLANT
GLOVE BIOGEL PI IND STRL 7.5 (GLOVE) ×1 IMPLANT
GLOVE BIOGEL PI IND STRL 8 (GLOVE) ×1 IMPLANT
GLOVE BIOGEL PI INDICATOR 7.5 (GLOVE) ×1
GLOVE BIOGEL PI INDICATOR 8 (GLOVE) ×1
GLOVE ECLIPSE 7.0 STRL STRAW (GLOVE) ×2 IMPLANT
GLOVE ORTHO TXT STRL SZ7.5 (GLOVE) ×2 IMPLANT
GOWN PREVENTION PLUS LG XLONG (DISPOSABLE) ×2 IMPLANT
GOWN STRL NON-REIN LRG LVL3 (GOWN DISPOSABLE) ×6 IMPLANT
KIT BASIN OR (CUSTOM PROCEDURE TRAY) ×2 IMPLANT
KIT ROOM TURNOVER OR (KITS) ×2 IMPLANT
MANIFOLD NEPTUNE II (INSTRUMENTS) ×2 IMPLANT
NDL SUT .5 MAYO 1.404X.05X (NEEDLE) IMPLANT
NEEDLE 22X1 1/2 (OR ONLY) (NEEDLE) ×2 IMPLANT
NEEDLE MAYO TAPER (NEEDLE)
NEEDLE SPNL 18GX3.5 QUINCKE PK (NEEDLE) ×2 IMPLANT
NS IRRIG 1000ML POUR BTL (IV SOLUTION) ×2 IMPLANT
PACK LAMINECTOMY ORTHO (CUSTOM PROCEDURE TRAY) ×2 IMPLANT
PAD ARMBOARD 7.5X6 YLW CONV (MISCELLANEOUS) ×4 IMPLANT
PATTIES SURGICAL .5 X.5 (GAUZE/BANDAGES/DRESSINGS) IMPLANT
PATTIES SURGICAL .75X.75 (GAUZE/BANDAGES/DRESSINGS) IMPLANT
SPONGE GAUZE 4X4 12PLY (GAUZE/BANDAGES/DRESSINGS) IMPLANT
SUT VIC AB 2-0 CT1 27 (SUTURE) ×1
SUT VIC AB 2-0 CT1 TAPERPNT 27 (SUTURE) ×1 IMPLANT
SUT VICRYL 0 TIES 12 18 (SUTURE) ×2 IMPLANT
SUT VICRYL 4-0 PS2 18IN ABS (SUTURE) ×2 IMPLANT
SUT VICRYL AB 2 0 TIES (SUTURE) ×2 IMPLANT
SYR 20ML ECCENTRIC (SYRINGE) IMPLANT
SYR CONTROL 10ML LL (SYRINGE) ×2 IMPLANT
TOWEL OR 17X24 6PK STRL BLUE (TOWEL DISPOSABLE) ×2 IMPLANT
TOWEL OR 17X26 10 PK STRL BLUE (TOWEL DISPOSABLE) ×2 IMPLANT
WATER STERILE IRR 1000ML POUR (IV SOLUTION) IMPLANT

## 2013-07-17 NOTE — Anesthesia Procedure Notes (Signed)
Procedure Name: Intubation Date/Time: 07/17/2013 10:47 AM Performed by: Lovie Chol Pre-anesthesia Checklist: Patient identified, Emergency Drugs available, Suction available, Patient being monitored and Timeout performed Patient Re-evaluated:Patient Re-evaluated prior to inductionOxygen Delivery Method: Circle system utilized Preoxygenation: Pre-oxygenation with 100% oxygen Intubation Type: IV induction Ventilation: Mask ventilation without difficulty and Oral airway inserted - appropriate to patient size Laryngoscope Size: Miller and 2 Grade View: Grade I Tube type: Oral Tube size: 7.5 mm Number of attempts: 1 Airway Equipment and Method: Stylet Placement Confirmation: ETT inserted through vocal cords under direct vision,  positive ETCO2,  CO2 detector and breath sounds checked- equal and bilateral Secured at: 22 cm Tube secured with: Tape Dental Injury: Teeth and Oropharynx as per pre-operative assessment

## 2013-07-17 NOTE — Anesthesia Postprocedure Evaluation (Signed)
  Anesthesia Post-op Note  Patient: Randy [name]  Procedure(s) Performed: Procedure(s) with comments: MICRODISCECTOMY LUMBAR LAMINECTOMY (Left) - Left L4 Laminotomy, Lateral Recess Decompression, Cyst Excision  Patient Location: PACU  Anesthesia Type:General  Level of Consciousness: awake  Airway and Oxygen Therapy: Patient Spontanous Breathing  Post-op Pain: mild  Post-op Assessment: Post-op Vital signs reviewed  Post-op Vital Signs: Reviewed  Complications: No apparent anesthesia complications

## 2013-07-17 NOTE — Preoperative (Signed)
Beta Blockers   Reason not to administer Beta Blockers:Not Applicable 

## 2013-07-17 NOTE — Transfer of Care (Signed)
Immediate Anesthesia Transfer of Care Note  Patient: Randy [name]  Procedure(s) Performed: Procedure(s) with comments: MICRODISCECTOMY LUMBAR LAMINECTOMY (Left) - Left L4 Laminotomy, Lateral Recess Decompression, Cyst Excision  Patient Location: PACU  Anesthesia Type:General  Level of Consciousness: awake, alert , oriented and patient cooperative  Airway & Oxygen Therapy: Patient Spontanous Breathing and Patient connected to nasal cannula oxygen  Post-op Assessment: Report given to PACU RN and Post -op Vital signs reviewed and stable  Post vital signs: Reviewed  Complications: No apparent anesthesia complications

## 2013-07-17 NOTE — Brief Op Note (Cosign Needed)
07/17/2013  12:16 PM  PATIENT:  Randy [name]  67 y.o. male  PRE-OPERATIVE DIAGNOSIS:  Left L4-5 Extradural Intraspinal Hemorrhagic Cyst with Radiculopathy  POST-OPERATIVE DIAGNOSIS:  Left L4-5 Extradural Intraspinal Hemorrhagic Cyst with Radiculopathy  PROCEDURE:  Procedure(s) with comments: MICRODISCECTOMY LUMBAR LAMINECTOMY (Left) - Left L4 Laminotomy, Lateral Recess Decompression, Cyst Excision  SURGEON:  Surgeon(s) and Role:    * Eldred Manges, MD - Primary  PHYSICIAN ASSISTANT: [name] [facility]  ASSISTANTS: none   ANESTHESIA:   general  EBL:  Total I/O In: 950 [I.V.:700; IV Piggyback:250] Out: -   BLOOD ADMINISTERED:none  DRAINS: none   LOCAL MEDICATIONS USED:  NONE  SPECIMEN:  No Specimen  DISPOSITION OF SPECIMEN:  N/A  COUNTS:  YES  TOURNIQUET:  * No tourniquets in log *  DICTATION: .Note written in EPIC  PLAN OF CARE: Admit for overnight observation  PATIENT DISPOSITION:  PACU - hemodynamically stable.   Delay start of Pharmacological VTE agent (>24hrs) due to surgical blood loss or risk of bleeding: yes

## 2013-07-17 NOTE — Anesthesia Preprocedure Evaluation (Addendum)
Anesthesia Evaluation  Patient identified by MRN, date of birth, ID band Patient awake    Reviewed: Allergy & Precautions, H&P , NPO status , Patient's Chart, lab work & pertinent test results, reviewed documented beta blocker date and time   History of Anesthesia Complications Negative for: history of anesthetic complications  Airway Mallampati: II TM Distance: >3 FB Neck ROM: Full    Dental  (+) Edentulous Upper, Poor Dentition and Dental Advisory Given   Pulmonary shortness of breath, sleep apnea , former smoker,  breath sounds clear to auscultation        Cardiovascular hypertension, Pt. on medications and Pt. on home beta blockers + CAD, + Past MI and +CHF + pacemaker Rhythm:Regular Rate:Normal     Neuro/Psych Anxiety    GI/Hepatic Neg liver ROS, GERD-  Medicated and Controlled,  Endo/Other  diabetes, Type 2, Oral Hypoglycemic Agents  Renal/GU negative Renal ROS     Musculoskeletal   Abdominal   Peds  Hematology   Anesthesia Other Findings   Reproductive/Obstetrics negative OB ROS                        Anesthesia Physical Anesthesia Plan  ASA: III  Anesthesia Plan: General   Post-op Pain Management:    Induction: Intravenous  Airway Management Planned: Oral ETT  Additional Equipment:   Intra-op Plan:   Post-operative Plan: Extubation in OR  Informed Consent:   Dental advisory given  Plan Discussed with: CRNA, Anesthesiologist and Surgeon  Anesthesia Plan Comments:         Anesthesia Quick Evaluation

## 2013-07-17 NOTE — Interval H&P Note (Signed)
History and Physical Interval Note:  07/17/2013 9:26 AM  Randy [name]  has presented today for surgery, with the diagnosis of Left L4-5 Extradural Intraspinal Hemorrhagic Cyst with Radiculopathy  The various methods of treatment have been discussed with the patient and family. After consideration of risks, benefits and other options for treatment, the patient has consented to  Procedure(s) with comments: MICRODISCECTOMY LUMBAR LAMINECTOMY (Left) - Left L4 Laminotomy, Lateral Recess Decompression, Cyst Excision as a surgical intervention .  The patient's history has been reviewed, patient examined, no change in status, stable for surgery.  I have reviewed the patient's chart and labs.  Questions were answered to the patient's satisfaction.     Vetta Couzens C

## 2013-07-18 LAB — GLUCOSE, CAPILLARY
Glucose-Capillary: 114 mg/dL — ABNORMAL HIGH (ref 70–99)
Glucose-Capillary: 85 mg/dL (ref 70–99)

## 2013-07-18 MED ORDER — PANTOPRAZOLE SODIUM 40 MG PO TBEC: 40.0000 mg | DELAYED_RELEASE_TABLET | Freq: Every day | ORAL | Status: DC

## 2013-07-18 NOTE — Progress Notes (Signed)
Subjective: 1 Day Post-Op Procedure(s) (LRB): MICRODISCECTOMY LUMBAR LAMINECTOMY (Left) Patient reports pain as 1 on 0-10 scale.    Objective: Vital signs in last 24 hours: Temp:  [97.3 F (36.3 C)-98.3 F (36.8 C)] 98.1 F (36.7 C) (12/20 1610) Pulse Rate:  [58-70] 66 (12/20 0608) Resp:  [9-18] 18 (12/20 0608) BP: (90-141)/(59-89) 131/60 mmHg (12/20 0608) SpO2:  [95 %-100 %] 99 % (12/20 0608)  Intake/Output from previous day: 12/19 0701 - 12/20 0700 In: 1728.8 [I.V.:1478.8; IV Piggyback:250] Out: -  Intake/Output this shift:    No results found for this basename: HGB,  in the last 72 hours No results found for this basename: WBC, RBC, HCT, PLT,  in the last 72 hours No results found for this basename: NA, K, CL, CO2, BUN, CREATININE, GLUCOSE, CALCIUM,  in the last 72 hours No results found for this basename: LABPT, INR,  in the last 72 hours  Neurologically intact  Assessment/Plan: 1 Day Post-Op Procedure(s) (LRB): MICRODISCECTOMY LUMBAR LAMINECTOMY (Left) Plan:   Discharge Home.    Patient had Dx that requires Medicare admission. He is walking in room , good relief of leg pain. Wants to go home.   Trishia Cuthrell C 07/18/2013, 10:09 AM

## 2013-07-18 NOTE — Op Note (Signed)
Randy [name] NO.:  [id number]  MEDICAL RECORD NO.:  [id number]  LOCATION:  [id number]                        FACILITY:  [name]  PHYSICIAN:  Randy Tangonan C. [name], M.D.    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  PROCEDURE: Partial L4 and L5 Laminectomy, microdissection, and excision of intraspinal extradural facet cyst.  SURGEON:  Randy Coller C. Randy Charter, MD  ASSISTANT:  Randy Deed, PA-C, medically necessary and present for the entire procedure.  ANESTHESIA:  General plus Marcaine local.  ESTIMATED BLOOD LOSS:  Minimal.  DRAINS:  None.  BRIEF HISTORY:  A 66 year old male had previous fusion at L3-4 with instrumentation, solid fusion, has developed progressive increasing back pain, leg pain, difficulty walking, leg weakness, and myelogram CT scan showed large intraspinal extradural facet cyst that was greater than the diameter of the dura causing significant compression on the left with radiculopathy weakness and giving way.  Failed conservative treatment, and he was brought in for excision of the cyst.  Options were discussed with them including fusing this adjacent level.  Onset of symptoms were so rapid, he had not been having difficulty ambulating prior to the onset of the cyst and simple cyst excision was recommended.  DESCRIPTION OF PROCEDURE:  After induction of general anesthesia, the patient was placed prone on chest rolls.  Standard prepping and draping, pads underneath the shoulders, ulnar nerve.  Back was prepped with DuraPrep, 10/10 had been applied at the S1 level.  Area was squared with towels.  After the DuraPrep was dried, sterile skin marker in  the old incision.  Betadine, Steri-Drape, laminectomy sheets, and drapes, spinal needle placement.  Cross-table lateral C-arm time-out procedure completed.  Needle was 1 level low, was moved up, rechecked, it was just below the 4-5 space at the level of L5 pedicle.  The patient had transitional anatomy.  The numbering was consistent with a numbering on lumbar myelogram CT scan that was on displaying in the room as well as plain radiographs __________.  The patient had instrumentation at the L3-4 level previously.  Operative levels, 1 level below.  Midline incision was made.  Subperiosteal dissection out to the facets was performed at the 4- 5 level.  Two Kocher clamps were placed and repeat cross-table lateral x- ray confirmed appropriate level.  Spinous process was removed.  Lamina was thinned with a rongeur and also the bur and then operative microscope was draped, brought in, and laminectomy was completed, making sure that there was no adherence to the underlying ligamentum and epidural fat at the superior aspect.  On the left side, there were extensive adhesions present laterally.  Microdissection techniques were used Penfield 4.  Black nerve hook __________ for protection and layer was developed between the dura and the lateral ligamentum and cyst. Cyst region was reported to be hemorrhagic or it was filled more with cheesy type material that looked like either gout or pseudogout-type  material.  Chunks of ligament and excision of the cyst was performed __________ disk was well visualized.  Lateral recess was completely decompressed.  Nerve root was free, top portion lamina at L5 was removed to make sure that the complete caudad extension of the cyst had been removed.  Passes were made anteriorly in front of the dura with no areas of compression.  Opposite right side had laminectomy to the palpable pedicle with good decompression, removal of chunks of ligament. Irrigation, operative  field was dried, no significant epidural bleeding. Fascia was closed with large Vicryl sutures, 2-0 Vicryl subcutaneous tissue, subcuticular closure, postop dressing, and transferred to the recovery room.  Procedure was as planned with the excision of the intraspinal extradural facet cyst causing severe stenosis and radiculopathy.     Randy Prashad C. [name], M.D.     MCY/MEDQ  D:  07/17/2013  T:  07/18/2013  Job:  [id number]

## 2013-07-20 NOTE — Discharge Summary (Signed)
Physician Discharge Summary  Patient ID: Randy [name] MRN: [id number] DOB/AGE: [birth year] 67 y.o.  Admit date: 07/17/2013 Discharge date: 07/18/2013  Admission Diagnoses:  Synovial cyst of lumbar spine Left L4-5, with central stenosis and left radiculopathy   Discharge Diagnoses:  Principal Problem:   Synovial cyst of lumbar spine same  as above.  Past Medical History  Diagnosis Date  . Dyslipidemia   . Diabetes mellitus   . CAD (coronary artery disease)   . Systolic dysfunction   . Vitamin D deficiency   . ICD (implantable cardiac defibrillator) in place     2009-[employer] Jude device  . Shortness of breath   . Pacemaker   . Sleep apnea     started a sleep study but didn't completed, states PCP told him he has sleep  apnea, doesn't use CPAP  . GERD (gastroesophageal reflux disease)   . Neuromuscular disorder     neuropathy- in hands  . Arthritis     hip, hands- Osteoarthritis    . Anemia   . Hypertension     g. taylor    Surgeries: Procedure(s): MICRODISCECTOMY LUMBAR LAMINECTOMY AND EXCISION OF CYST L4-5 LEFT  Consultants (if any):  NONE  Discharged Condition: Improved  Hospital Course: SPURGEON GANCARZ is an 67 y.o. male who was admitted 07/17/2013 with a diagnosis of Synovial cyst of lumbar spine and went to the operating room on 07/17/2013 and underwent the above named procedures.    He was given perioperative antibiotics:      Anti-infectives   Start     Dose/Rate Route Frequency Ordered Stop   07/17/13 1500  ceFAZolin (ANCEF) IVPB 1 g/50 mL premix     1 g 100 mL/hr over 30 Minutes Intravenous  Once 07/17/13 1418 07/17/13 1633   07/17/13 0600  ceFAZolin (ANCEF) IVPB 2 g/50 mL premix     2 g 100 mL/hr over 30 Minutes Intravenous On call to O.R. 07/16/13 1435 07/17/13 1100    .  He was given sequential compression devices, early ambulation for DVT prophylaxis.  He benefited maximally from the hospital stay and there were no complications.    Recent  vital signs:  Filed Vitals:   07/18/13 0608  BP: 131/60  Pulse: 66  Temp: 98.1 F (36.7 C)  Resp: 18    Recent laboratory studies:  Lab Results  Component Value Date   HGB 13.6 07/13/2013   HGB 9.8* 09/19/2012   HGB 10.5* 09/18/2012   Lab Results  Component Value Date   WBC 7.0 07/13/2013   PLT 150 07/13/2013   Lab Results  Component Value Date   INR 1.05 07/13/2013   Lab Results  Component Value Date   NA 137 07/13/2013   K 4.9 07/13/2013   CL 101 07/13/2013   CO2 25 07/13/2013   BUN 22 07/13/2013   CREATININE 1.18 07/13/2013   GLUCOSE 121* 07/13/2013    Discharge Medications:     Medication List         aspirin EC 81 MG tablet  Take 81 mg by mouth daily.     carvedilol 6.25 MG tablet  Commonly known as:  COREG  Take 6.25 mg by mouth 2 (two) times daily with a meal.     glipiZIDE 5 MG tablet  Commonly known as:  GLUCOTROL  Take 5 mg by mouth daily before breakfast.     lisinopril 5 MG tablet  Commonly known as:  PRINIVIL,ZESTRIL  Take 5 mg by mouth daily.  lovastatin 20 MG tablet  Commonly known as:  MEVACOR  Take 20 mg by mouth at bedtime.     niacin 500 MG CR tablet  Commonly known as:  NIASPAN  Take 500 mg by mouth daily.     oxyCODONE-acetaminophen 5-325 MG per tablet  Commonly known as:  ROXICET  Take 1-2 tablets by mouth every 4 (four) hours as needed for severe pain.     tamsulosin 0.4 MG Caps capsule  Commonly known as:  FLOMAX  Take 0.4 mg by mouth daily.        Diagnostic Studies: Dg Chest 2 View  07/13/2013   CLINICAL DATA:  L4 laminectomy  EXAM: CHEST  2 VIEW  COMPARISON:  04/29/2012  FINDINGS: There is no focal parenchymal opacity, pleural effusion, or pneumothorax. The heart and mediastinal contours are unremarkable. Single lead cardiac ICD.  The osseous structures are unremarkable.  IMPRESSION: No active cardiopulmonary disease.   Electronically Signed   By: Elige Ko   On: 07/13/2013 13:38   Dg Lumbar Spine 2-3  Views  07/17/2013   CLINICAL DATA:  L4 laminotomy.  EXAM: LUMBAR SPINE - 2-3 VIEW  COMPARISON:  07/07/2013  FINDINGS: Cross-table lateral views of the lumbar spine were obtained. Again noted is pedicle screw and rod fixation at L3-L4 with an interbody device. Stable mild anterolisthesis at L3-L4. The 1st image demonstrates surgical marking along the posterior aspect of S1. The 2nd image demonstrates surgical marking along the posterior aspect of L5. The 3rd image demonstrates surgical instruments along the posterior aspect of L4-L5.  IMPRESSION: Surgical marking in the lower lumbar spine as described.   Electronically Signed   By: Richarda Overlie M.D.   On: 07/17/2013 13:01   Ct Lumbar Spine W Contrast  07/07/2013   CLINICAL DATA:  Low back pain extending into the left lower extremity. Previous lumbar fusion. The pain extends into his left lateral thigh and posterior calf.  EXAM: LUMBAR MYELOGRAM  TECHNIQUE: Contiguous axial images were obtained through the Lumbar spine after the intrathecal infusion of infusion. Coronal and sagittal reconstructions were obtained of the axial image sets.  COMPARISON:  Lumbar myelogram 11/20/2007  FLUOROSCOPY TIME:  35 seconds  PROCEDURE: After thorough discussion of risks and benefits of the procedure including bleeding, infection, injury to nerves, blood vessels, adjacent structures as well as headache and CSF leak, written and oral informed consent was obtained. Consent was obtained by Dr. Gennette Pac. Time out form was completed.  Patient was positioned prone on the fluoroscopy table. Local anesthesia was provided with 1% lidocaine without epinephrine after prepped and draped in the usual sterile fashion. Puncture was performed at L1-2 using a 3 1/2 inch 22-gauge spinal needle via left paramedian approach. Using a single pass through the dura, the needle was placed within the thecal sac, with return of clear CSF. 15 mL of Omnipaque-180 was injected into the thecal sac, with  normal opacification of the nerve roots and cauda equina consistent with free flow within the subarachnoid space.  I personally performed the lumbar puncture and administered the intrathecal contrast. I also personally supervised acquisition of the myelogram images.  FINDINGS: LUMBAR MYELOGRAM FINDINGS:  Five non rib-bearing lumbar type vertebral bodies are present. A transitional L5 segment is noted. The patient is fused posteriorly at L3-4, as before. Slight lateral recess narrowing on the right at L2-3 demonstrates mild progression since the prior study.  Moderate central canal stenosis at L4-5 is new. There is slight anterolisthesis at L4-5 as  well. The fusion at L3-4 is stable. Moderate lateral recess narrowing is present bilaterally.  The anterolisthesis at L4-5 is exaggerated upon standing and with flexion. There is slight reduction during extension.  CT LUMBAR MYELOGRAM FINDINGS:  The cervical spine is imaged from the midbody of T11 to S2-3. The fusion at L3-4 is stable. Slight anterolisthesis is now present at L4-5. Conus medullaris terminates at L1, stable position. Atherosclerotic calcifications are noted in the aorta without aneurysm.  The disc levels at T12-L1 and above are normal.  L1-2: Mild broad-based disc bulging is present. Mild facet hypertrophy is noted bilaterally. There is no significant stenosis or change.  L2-3: Moderate facet hypertrophy is present. Ligamentum flavum thickening is noted. These factors result in mild lateral recess narrowing bilaterally. Mild foraminal narrowing is present on both sides.  L3-4: The patient is fused anteriorly. The patient is status post bilateral laminectomies and foraminotomies. Anterolisthesis is stable. There is no residual or recurrent stenosis.  L4-5: A new intermediate density lesion emanates from the left L4-5 facet and likely represents a hemorrhagic synovial cyst. This results in severe left lateral recess narrowing. A broad-based disc herniation  contributes. Moderate foraminal stenosis bilaterally has progressed since prior exam. Mild to moderate right lateral recess narrowing is worse as well.  L5-S1: This is a transitional segment as before. A rudimentary disc is present. There is no significant herniation or stenosis. Mild facet hypertrophy is noted. The transverse processes fused to the sacral ala.  IMPRESSION: LUMBAR MYELOGRAM IMPRESSION:  1. New dynamic anterolisthesis at L4-5 with associated mild to moderate lateral recess narrowing bilaterally. 2. Stable fusion at L3-4 without significant residual stenosis. 3. Slight progression of minimal right lateral recess narrowing at L2-3 since the prior study.  CT LUMBAR MYELOGRAM IMPRESSION:  1. Mild disc bulging and facet hypertrophy at L1-2 without significant stenosis. 2. Mild lateral recess and foraminal narrowing on both sides at L2-3. 3. Stable fusion at L3-4 without evidence for residual or recurrent stenosis. 4. New intermediate density lesion extending into the left lateral recess at L4-5 measures 11 x 9 mm and is most compatible with a hemorrhagic synovial cyst. 5. Severe left lateral recess narrowing at L4-5 as a result. 6. Moderate foraminal narrowing bilaterally at L4-5. 7. Transitional segment at L5-S1 with some progression of facet hypertrophy but no significant stenosis.   Electronically Signed   By: Gennette Pac M.D.   On: 07/07/2013 14:20   Dg Myelogram Lumbar  07/07/2013   CLINICAL DATA:  Low back pain extending into the left lower extremity. Previous lumbar fusion. The pain extends into his left lateral thigh and posterior calf.  EXAM: LUMBAR MYELOGRAM  TECHNIQUE: Contiguous axial images were obtained through the Lumbar spine after the intrathecal infusion of infusion. Coronal and sagittal reconstructions were obtained of the axial image sets.  COMPARISON:  Lumbar myelogram 11/20/2007  FLUOROSCOPY TIME:  35 seconds  PROCEDURE: After thorough discussion of risks and benefits of the  procedure including bleeding, infection, injury to nerves, blood vessels, adjacent structures as well as headache and CSF leak, written and oral informed consent was obtained. Consent was obtained by Dr. Gennette Pac. Time out form was completed.  Patient was positioned prone on the fluoroscopy table. Local anesthesia was provided with 1% lidocaine without epinephrine after prepped and draped in the usual sterile fashion. Puncture was performed at L1-2 using a 3 1/2 inch 22-gauge spinal needle via left paramedian approach. Using a single pass through the dura, the needle was placed within  the thecal sac, with return of clear CSF. 15 mL of Omnipaque-180 was injected into the thecal sac, with normal opacification of the nerve roots and cauda equina consistent with free flow within the subarachnoid space.  I personally performed the lumbar puncture and administered the intrathecal contrast. I also personally supervised acquisition of the myelogram images.  FINDINGS: LUMBAR MYELOGRAM FINDINGS:  Five non rib-bearing lumbar type vertebral bodies are present. A transitional L5 segment is noted. The patient is fused posteriorly at L3-4, as before. Slight lateral recess narrowing on the right at L2-3 demonstrates mild progression since the prior study.  Moderate central canal stenosis at L4-5 is new. There is slight anterolisthesis at L4-5 as well. The fusion at L3-4 is stable. Moderate lateral recess narrowing is present bilaterally.  The anterolisthesis at L4-5 is exaggerated upon standing and with flexion. There is slight reduction during extension.  CT LUMBAR MYELOGRAM FINDINGS:  The cervical spine is imaged from the midbody of T11 to S2-3. The fusion at L3-4 is stable. Slight anterolisthesis is now present at L4-5. Conus medullaris terminates at L1, stable position. Atherosclerotic calcifications are noted in the aorta without aneurysm.  The disc levels at T12-L1 and above are normal.  L1-2: Mild broad-based disc  bulging is present. Mild facet hypertrophy is noted bilaterally. There is no significant stenosis or change.  L2-3: Moderate facet hypertrophy is present. Ligamentum flavum thickening is noted. These factors result in mild lateral recess narrowing bilaterally. Mild foraminal narrowing is present on both sides.  L3-4: The patient is fused anteriorly. The patient is status post bilateral laminectomies and foraminotomies. Anterolisthesis is stable. There is no residual or recurrent stenosis.  L4-5: A new intermediate density lesion emanates from the left L4-5 facet and likely represents a hemorrhagic synovial cyst. This results in severe left lateral recess narrowing. A broad-based disc herniation contributes. Moderate foraminal stenosis bilaterally has progressed since prior exam. Mild to moderate right lateral recess narrowing is worse as well.  L5-S1: This is a transitional segment as before. A rudimentary disc is present. There is no significant herniation or stenosis. Mild facet hypertrophy is noted. The transverse processes fused to the sacral ala.  IMPRESSION: LUMBAR MYELOGRAM IMPRESSION:  1. New dynamic anterolisthesis at L4-5 with associated mild to moderate lateral recess narrowing bilaterally. 2. Stable fusion at L3-4 without significant residual stenosis. 3. Slight progression of minimal right lateral recess narrowing at L2-3 since the prior study.  CT LUMBAR MYELOGRAM IMPRESSION:  1. Mild disc bulging and facet hypertrophy at L1-2 without significant stenosis. 2. Mild lateral recess and foraminal narrowing on both sides at L2-3. 3. Stable fusion at L3-4 without evidence for residual or recurrent stenosis. 4. New intermediate density lesion extending into the left lateral recess at L4-5 measures 11 x 9 mm and is most compatible with a hemorrhagic synovial cyst. 5. Severe left lateral recess narrowing at L4-5 as a result. 6. Moderate foraminal narrowing bilaterally at L4-5. 7. Transitional segment at L5-S1  with some progression of facet hypertrophy but no significant stenosis.   Electronically Signed   By: Gennette Pac M.D.   On: 07/07/2013 14:20    Disposition: 01-Home or Self Care   Future Appointments Provider Department Dept Phone   08/03/2013 8:15 AM Dianne Dun, MD [facility] [phone]   09/03/2013 8:15 AM [facility] [employer] [phone]    No lifting greater than 10 lbs. Avoid bending, stooping and twisting. Walk in house for first  week them may start to get out slowly increasing distance up to one mile by 3 weeks post op. Keep incision dry for 3 days, may use tegaderm or similar water impervious dressing.   Follow-up Information   Follow up with Eldred Manges, MD. Schedule an appointment as soon as possible for a visit in 2 weeks.   Specialty:  Orthopedic Surgery   Contact information:   [address] [place] [address] [phone]        Signed: Wende Neighbors 07/20/2013, 4:10 PM

## 2013-07-21 ENCOUNTER — Encounter (HOSPITAL_COMMUNITY): Payer: Self-pay | Admitting: Orthopaedic Surgery

## 2013-07-24 ENCOUNTER — Encounter: Payer: Self-pay | Admitting: Family Medicine

## 2013-07-24 DIAGNOSIS — N183 Chronic kidney disease, stage 3 (moderate): Secondary | ICD-10-CM

## 2013-08-03 ENCOUNTER — Encounter: Payer: Self-pay | Admitting: *Deleted

## 2013-08-03 ENCOUNTER — Ambulatory Visit (INDEPENDENT_AMBULATORY_CARE_PROVIDER_SITE_OTHER): Payer: Medicare Other | Admitting: Family Medicine

## 2013-08-03 ENCOUNTER — Encounter: Payer: Self-pay | Admitting: Family Medicine

## 2013-08-03 VITALS — BP 118/60 | HR 59 | Temp 98.0°F | Ht 67.0 in | Wt 198.5 lb

## 2013-08-03 DIAGNOSIS — N183 Chronic kidney disease, stage 3 unspecified: Secondary | ICD-10-CM

## 2013-08-03 DIAGNOSIS — E1129 Type 2 diabetes mellitus with other diabetic kidney complication: Secondary | ICD-10-CM

## 2013-08-03 DIAGNOSIS — E785 Hyperlipidemia, unspecified: Secondary | ICD-10-CM

## 2013-08-03 DIAGNOSIS — E1121 Type 2 diabetes mellitus with diabetic nephropathy: Secondary | ICD-10-CM

## 2013-08-03 DIAGNOSIS — N058 Unspecified nephritic syndrome with other morphologic changes: Secondary | ICD-10-CM

## 2013-08-03 DIAGNOSIS — I10 Essential (primary) hypertension: Secondary | ICD-10-CM

## 2013-08-03 LAB — COMPREHENSIVE METABOLIC PANEL
ALBUMIN: 4.2 g/dL (ref 3.5–5.2)
ALK PHOS: 43 U/L (ref 39–117)
ALT: 15 U/L (ref 0–53)
AST: 18 U/L (ref 0–37)
BUN: 15 mg/dL (ref 6–23)
CO2: 27 mEq/L (ref 19–32)
Calcium: 9.2 mg/dL (ref 8.4–10.5)
Chloride: 105 mEq/L (ref 96–112)
Creatinine, Ser: 1.2 mg/dL (ref 0.4–1.5)
GFR: 62.83 mL/min (ref >60.00)
Glucose, Bld: 110 mg/dL — ABNORMAL HIGH (ref 70–99)
POTASSIUM: 4.4 meq/L (ref 3.5–5.1)
SODIUM: 138 meq/L (ref 135–145)
TOTAL PROTEIN: 7.3 g/dL (ref 6.0–8.3)
Total Bilirubin: 0.6 mg/dL (ref 0.3–1.2)

## 2013-08-03 LAB — LIPID PANEL
Cholesterol: 169 mg/dL (ref 0–200)
HDL: 30.9 mg/dL — ABNORMAL LOW (ref >39.00)
LDL CALC: 103 mg/dL — AB (ref 0–99)
TRIGLYCERIDES: 174 mg/dL — AB (ref 0.0–149.0)
Total CHOL/HDL Ratio: 5
VLDL: 34.8 mg/dL (ref 0.0–40.0)

## 2013-08-03 LAB — HEMOGLOBIN A1C: Hgb A1c MFr Bld: 6.7 % — ABNORMAL HIGH (ref 4.6–6.5)

## 2013-08-03 NOTE — Patient Instructions (Signed)
Good to see you. We will call you with your lab results.  Happy New Year!

## 2013-08-03 NOTE — Assessment & Plan Note (Signed)
Well controlled on Low dose ACEI.

## 2013-08-03 NOTE — Progress Notes (Signed)
Subjective:    Patient ID: Randy [name], male    DOB: May 13, 1946, 68 y.o.   MRN: [id number]  HPI  68 yo pleasant male here for follow up.  Had L4-L5 laminectomy a few weeks ago by Dr. Lorin Mercy.  He is recovering well.  Already feels much better.   DM- on glipizide 5 mg daily.  Checks fasting CBGs three times weekly- usually 120s-130s.  Was 135 fasting yesterday. No recent h/o hypoglycemia. Lab Results  Component Value Date   HGBA1C 6.6* 01/06/2013    On Lisinopril 5 mg daily.  Lab Results  Component Value Date   HGBA1C 6.6* 01/06/2013   Diabetic nephropathy- followed by Dr. Juleen China.  Last saw him in 05/2013- Cr. 1.21, stable.  He is followed by cardiology.  Takes Mevacor 20 mg daily along with Niaspan.  Due for labs. Lab Results  Component Value Date   CHOL 217* 01/06/2013   HDL 33.20* 01/06/2013   LDLCALC 78 06/19/2011   LDLDIRECT 137.7 01/06/2013   TRIG 246.0* 01/06/2013   CHOLHDL 7 01/06/2013      Patient Active Problem List  Diagnosis  . DM  . Unspecified vitamin D deficiency  . HYPERLIPIDEMIA  . HYPERTENSION  . MYOCARDIAL INFARCTION  . IMPOTENCE OF ORGANIC ORIGIN  . HYPERSOMNIA UNSPECIFIED  . AUTOMATIC IMPLANTABLE CARDIAC DEFIBRILLATOR SITU  . Chronic systolic heart failure  . Muscle cramps  . Dehydration  . Acute renal failure  . Metabolic acidosis   Past Medical History  Diagnosis Date  . Hypertension   . Dyslipidemia   . Diabetes mellitus   . CAD (coronary artery disease)   . Systolic dysfunction   . Vitamin d deficiency    Past Surgical History  Procedure Date  . Back surgery   . Left carpal tunnel release   . Left rotator cuff surgery    History  Substance Use Topics  . Smoking status: Former Smoker    Types: Cigarettes    Quit date: 07/30/1984  . Smokeless tobacco: Not on file  . Alcohol Use: No   Family History  Problem Relation Age of Onset  . Heart attack Father 62  . Diabetes Brother    No Known Allergies Current Outpatient  Prescriptions on File Prior to Visit  Medication Sig Dispense Refill  . acetaminophen (TYLENOL) 325 MG tablet Take 2 tablets (650 mg total) by mouth every 6 (six) hours as needed for pain.      Marland Kitchen aspirin 81 MG tablet Take 81 mg by mouth daily.      Marland Kitchen glipiZIDE (GLUCOTROL) 5 MG tablet TAKE ONE TABLET BY MOUTH TWICE DAILY  60 tablet  6  . lovastatin (MEVACOR) 20 MG tablet TAKE FOUR TABLETS BY MOUTH EVERY DAY  120 tablet  6  . metFORMIN (GLUCOPHAGE) 500 MG tablet TAKE ONE TABLET BY MOUTH TWICE DAILY  60 tablet  6  . NIASPAN 500 MG CR tablet TAKE ONE TABLET BY MOUTH AT BEDTIME  30 each  6  . Tamsulosin HCl (FLOMAX) 0.4 MG CAPS TAKE ONE CAPSULE BY MOUTH EVERY DAY IN THE EVENING  30 capsule  60   The PMH, PSH, Social History, Family History, Medications, and allergies have been reviewed in [facility], and have been updated if relevant.    Review of Systems    See HPI Objective:   Physical Exam BP 118/60  Pulse 59  Temp(Src) 98 F (36.7 C) (Oral)  Ht 5\' 7"  (1.702 m)  Wt 198 lb 8 oz (90.039 kg)  BMI 31.08 kg/m2  SpO2 99% General:  pleasant male in NAD Eyes:  PERRL Ears:  External ear exam shows no significant lesions or deformities.  Otoscopic examination reveals clear canals, tympanic membranes are intact bilaterally without bulging, retraction, inflammation or discharge. Hearing is grossly normal bilaterally. Nose:  External nasal examination shows no deformity or inflammation. Nasal mucosa are pink and moist without lesions or exudates. Mouth:  Oral mucosa and oropharynx without lesions or exudates.  Teeth in good repair. Neck:  no carotid bruit or thyromegaly no cervical or supraclavicular lymphadenopathy  Lungs:  Normal respiratory effort, chest expands symmetrically. Lungs are clear to auscultation, no crackles or wheezes. Heart:  Normal rate and regular rhythm. S1 and S2 normal without gallop, murmur, click, rub or other extra sounds. Pulses:  R and L posterior tibial pulses are full and  equal bilaterally  Extremities:  no edema  Diabetic foot exam: Normal inspection No skin breakdown No calluses  Normal DP pulses Normal sensation to light touch and monofilament Nails normal     Assessment & Plan:

## 2013-08-03 NOTE — Assessment & Plan Note (Signed)
Continue meds at current doses. Recheck labs today.

## 2013-08-03 NOTE — Assessment & Plan Note (Addendum)
Continue current rx.  On ACEI. Check labs today. Orders Placed This Encounter  Procedures  . Hemoglobin A1c  . Comprehensive metabolic panel  . Lipid Panel  . HM Diabetes Foot Exam

## 2013-08-03 NOTE — Progress Notes (Signed)
Pre-visit discussion using our clinic review tool. No additional management support is needed unless otherwise documented below in the visit note.  

## 2013-08-17 ENCOUNTER — Telehealth: Payer: Self-pay

## 2013-08-17 ENCOUNTER — Other Ambulatory Visit: Payer: Self-pay | Admitting: *Deleted

## 2013-08-17 MED ORDER — LISINOPRIL 5 MG PO TABS: 5.0000 mg | ORAL_TABLET | Freq: Every day | ORAL | Status: DC

## 2013-08-17 MED ORDER — CARVEDILOL 6.25 MG PO TABS: 6.2500 mg | ORAL_TABLET | Freq: Two times a day (BID) | ORAL | Status: DC

## 2013-08-17 NOTE — Telephone Encounter (Signed)
Pt wants all refills sent to pharmacy for pick up at same time. Advised pt sometimes insurance will not allow early refills on meds so may not be able to get all meds at same time; pt said pharmacy is sending request for meds needed and if can send all at same time. Advised pt will wait to get request from pharmacy. Pt voiced understanding.

## 2013-09-03 ENCOUNTER — Ambulatory Visit (INDEPENDENT_AMBULATORY_CARE_PROVIDER_SITE_OTHER): Payer: Medicare Other

## 2013-09-03 ENCOUNTER — Encounter: Payer: Medicare Other | Admitting: *Deleted

## 2013-09-03 DIAGNOSIS — I428 Other cardiomyopathies: Secondary | ICD-10-CM

## 2013-09-16 ENCOUNTER — Encounter: Payer: Self-pay | Admitting: *Deleted

## 2013-09-17 LAB — MDC_IDC_ENUM_SESS_TYPE_REMOTE
Battery Remaining Longevity: 44 mo
Battery Voltage: 2.69 V
Brady Statistic RV Percent Paced: 1 %
Date Time Interrogation Session: 20150205070838
HIGH POWER IMPEDANCE MEASURED VALUE: 72 Ohm
HighPow Impedance: 72 Ohm
Implantable Pulse Generator Serial Number: 663191
Lead Channel Impedance Value: 360 Ohm
Lead Channel Pacing Threshold Pulse Width: 0.8 ms
Lead Channel Setting Pacing Amplitude: 2.5 V
Lead Channel Setting Pacing Pulse Width: 0.8 ms
Lead Channel Setting Sensing Sensitivity: 0.3 mV
MDC IDC MSMT LEADCHNL RV PACING THRESHOLD AMPLITUDE: 1.25 V
MDC IDC MSMT LEADCHNL RV SENSING INTR AMPL: 5 mV
MDC IDC SET ZONE DETECTION INTERVAL: 250 ms
Zone Setting Detection Interval: 300 ms
Zone Setting Detection Interval: 340 ms

## 2013-09-18 ENCOUNTER — Other Ambulatory Visit: Payer: Self-pay | Admitting: *Deleted

## 2013-09-18 NOTE — Telephone Encounter (Signed)
Pt requesting medication refill. Last ov 07/2013 with no future appts scheduled. Med on Hx list. pls advise

## 2013-09-21 MED ORDER — GLIPIZIDE 5 MG PO TABS: 5.0000 mg | ORAL_TABLET | Freq: Every day | ORAL | Status: DC

## 2013-09-21 MED ORDER — LOVASTATIN 20 MG PO TABS: 20.0000 mg | ORAL_TABLET | Freq: Every day | ORAL | Status: DC

## 2013-09-21 MED ORDER — TAMSULOSIN HCL 0.4 MG PO CAPS
0.4000 mg | ORAL_CAPSULE | Freq: Every day | ORAL | Status: DC
Start: ? — End: 2014-03-22

## 2013-09-23 ENCOUNTER — Encounter: Payer: Self-pay | Admitting: *Deleted

## 2013-10-12 ENCOUNTER — Encounter: Payer: Self-pay | Admitting: Internal Medicine

## 2013-12-08 ENCOUNTER — Encounter: Payer: Self-pay | Admitting: Internal Medicine

## 2013-12-08 ENCOUNTER — Ambulatory Visit (INDEPENDENT_AMBULATORY_CARE_PROVIDER_SITE_OTHER): Payer: Medicare Other | Admitting: *Deleted

## 2013-12-08 DIAGNOSIS — I428 Other cardiomyopathies: Secondary | ICD-10-CM

## 2013-12-08 NOTE — Progress Notes (Signed)
Remote ICD transmission.   

## 2013-12-11 LAB — MDC_IDC_ENUM_SESS_TYPE_REMOTE
Battery Voltage: 2.66 V
Brady Statistic RV Percent Paced: 1 %
Date Time Interrogation Session: 20150512073840
HIGH POWER IMPEDANCE MEASURED VALUE: 65 Ohm
HighPow Impedance: 65 Ohm
Implantable Pulse Generator Serial Number: 663191
Lead Channel Impedance Value: 390 Ohm
Lead Channel Pacing Threshold Amplitude: 1.25 V
Lead Channel Pacing Threshold Pulse Width: 0.8 ms
Lead Channel Sensing Intrinsic Amplitude: 6.5 mV
Lead Channel Setting Pacing Amplitude: 2.5 V
Lead Channel Setting Pacing Pulse Width: 0.8 ms
Lead Channel Setting Sensing Sensitivity: 0.3 mV
MDC IDC MSMT BATTERY REMAINING LONGEVITY: 43 mo
MDC IDC SET ZONE DETECTION INTERVAL: 250 ms
MDC IDC SET ZONE DETECTION INTERVAL: 340 ms
Zone Setting Detection Interval: 300 ms

## 2013-12-25 ENCOUNTER — Encounter: Payer: Self-pay | Admitting: Cardiology

## 2013-12-28 ENCOUNTER — Encounter: Payer: Self-pay | Admitting: Internal Medicine

## 2013-12-28 ENCOUNTER — Ambulatory Visit (INDEPENDENT_AMBULATORY_CARE_PROVIDER_SITE_OTHER): Payer: Medicare Other | Admitting: Internal Medicine

## 2013-12-28 ENCOUNTER — Other Ambulatory Visit: Payer: Self-pay | Admitting: *Deleted

## 2013-12-28 VITALS — BP 118/52 | HR 71 | Temp 97.9°F | Wt 197.0 lb

## 2013-12-28 DIAGNOSIS — R42 Dizziness and giddiness: Secondary | ICD-10-CM

## 2013-12-28 DIAGNOSIS — I959 Hypotension, unspecified: Secondary | ICD-10-CM

## 2013-12-28 LAB — CBC
HEMATOCRIT: 38.5 % — AB (ref 39.0–52.0)
Hemoglobin: 12.8 g/dL — ABNORMAL LOW (ref 13.0–17.0)
MCHC: 33.3 g/dL (ref 30.0–36.0)
MCV: 88.2 fl (ref 78.0–100.0)
Platelets: 153 10*3/uL (ref 150.0–400.0)
RBC: 4.37 Mil/uL (ref 4.22–5.81)
RDW: 13.4 % (ref 11.5–15.5)
WBC: 5.9 10*3/uL (ref 4.0–10.5)

## 2013-12-28 LAB — COMPREHENSIVE METABOLIC PANEL
ALT: 15 U/L (ref 0–53)
AST: 21 U/L (ref 0–37)
Albumin: 4.2 g/dL (ref 3.5–5.2)
Alkaline Phosphatase: 43 U/L (ref 39–117)
BILIRUBIN TOTAL: 0.6 mg/dL (ref 0.2–1.2)
BUN: 30 mg/dL — AB (ref 6–23)
CO2: 25 meq/L (ref 19–32)
CREATININE: 1.2 mg/dL (ref 0.4–1.5)
Calcium: 9.2 mg/dL (ref 8.4–10.5)
Chloride: 104 mEq/L (ref 96–112)
GFR: 62.76 mL/min (ref >60.00)
Glucose, Bld: 103 mg/dL — ABNORMAL HIGH (ref 70–99)
Potassium: 4.4 mEq/L (ref 3.5–5.1)
Sodium: 136 mEq/L (ref 135–145)
Total Protein: 7 g/dL (ref 6.0–8.3)

## 2013-12-28 MED ORDER — NIACIN ER (ANTIHYPERLIPIDEMIC) 500 MG PO TBCR: 500.0000 mg | EXTENDED_RELEASE_TABLET | Freq: Every day | ORAL | Status: DC

## 2013-12-28 NOTE — Progress Notes (Signed)
Pre visit review using our clinic review tool, if applicable. No additional management support is needed unless otherwise documented below in the visit note. 

## 2013-12-28 NOTE — Patient Instructions (Addendum)

## 2013-12-28 NOTE — Progress Notes (Signed)
Subjective:    Patient ID: Randy [name], male    DOB: [birth year], 68 y.o.   MRN: [id number]  HPI  Pt presents to the clinic today with c/o dizziness. He reports this started 1 week ago. He seems to feel dizzy mostly in the morning. He reports he did fall after an episode of dizziness 2 days while walking back from the bathroom. He did take his blood pressure at that time and reports it was 79/57. He does feel like the room spins at times. He had been working outside in the hot sun prior to this episode. He may have been dehydrated. He reports he sweats a lot and does not drink enough fluids. He denied chest pain, chest tightness or shortness of breath. He reports that since that time, he has stopped his lisinopril and coreg. He is also on flomax for BPH.  Review of Systems  Past Medical History  Diagnosis Date  . Dyslipidemia   . Type 2 diabetes with nephropathy   . CAD (coronary artery disease)   . Systolic dysfunction   . Vitamin D deficiency   . ICD (implantable cardiac defibrillator) in place     2009-St. Jude device  . Shortness of breath   . Pacemaker   . Sleep apnea     started a sleep study but didn't completed, states PCP told him he has sleep  apnea, doesn't use CPAP  . GERD (gastroesophageal reflux disease)   . Neuromuscular disorder     neuropathy- in hands  . Arthritis     hip, hands- Osteoarthritis    . Anemia   . Hypertension     g. taylor  . CKD (chronic kidney disease) stage 3, GFR 30-59 ml/min     [name]    Current Outpatient Prescriptions  Medication Sig Dispense Refill  . aspirin EC 81 MG tablet Take 81 mg by mouth daily.      . carvedilol (COREG) 6.25 MG tablet Take 1 tablet (6.25 mg total) by mouth 2 (two) times daily with a meal.  60 tablet  5  . glipiZIDE (GLUCOTROL) 5 MG tablet Take 1 tablet (5 mg total) by mouth daily before breakfast.  30 tablet  6  . lisinopril (PRINIVIL,ZESTRIL) 5 MG tablet Take 1 tablet (5 mg total) by mouth daily.  30  tablet  5  . lovastatin (MEVACOR) 20 MG tablet Take 1 tablet (20 mg total) by mouth at bedtime.  30 tablet  6  . niacin (NIASPAN) 500 MG CR tablet Take 1 tablet (500 mg total) by mouth daily.  30 tablet  0  . tamsulosin (FLOMAX) 0.4 MG CAPS capsule Take 1 capsule (0.4 mg total) by mouth daily.  30 capsule  6   No current facility-administered medications for this visit.    No Known Allergies  Family History  Problem Relation Age of Onset  . Heart attack Father 22  . Diabetes Brother     History   Social History  . Marital Status: Single    Spouse Name: N/A    Number of Children: 2  . Years of Education: N/A   Occupational History  . 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 Smoking status: Former Smoker    Types: Cigarettes    Quit date: 07/30/1984  . Smokeless tobacco: Never Used  . Alcohol Use: Yes     Comment: occ  . Drug Use: No  . Sexual Activity: Not on  file   Other Topics Concern  . Not on file   Social History Narrative   Lives with friend           Constitutional: Denies fever, malaise, fatigue, headache or abrupt weight changes.  Respiratory: Denies difficulty breathing, shortness of breath, cough or sputum production.   Cardiovascular: Denies chest pain, chest tightness, palpitations or swelling in the hands or feet.  Neurological: Pt reports dizziness. Denies difficulty with memory, difficulty with speech or problems with balance and coordination.   No other specific complaints in a complete review of systems (except as listed in HPI above).     Objective:   Physical Exam   BP 118/52  Pulse 71  Temp(Src) 97.9 F (36.6 C) (Oral)  Wt 197 lb (89.359 kg)  SpO2 97% Wt Readings from Last 3 Encounters:  12/28/13 197 lb (89.359 kg)  08/03/13 198 lb 8 oz (90.039 kg)  06/02/13 191 lb 6.4 oz (86.818 kg)    General: Appears his stated age, well developed, well nourished in NAD. Cardiovascular: Normal rate and rhythm. S1,S2  noted.  No murmur, rubs or gallops noted. No JVD or BLE edema. No carotid bruits noted. Pulmonary/Chest: Normal effort and positive vesicular breath sounds. No respiratory distress. No wheezes, rales or ronchi noted.  Neurological: Alert and oriented. Cranial nerves grossly intact. Coordination normal.    BMET    Component Value Date/Time   NA 138 08/03/2013 0905   K 4.4 08/03/2013 0905   CL 105 08/03/2013 0905   CO2 27 08/03/2013 0905   GLUCOSE 110* 08/03/2013 0905   BUN 15 08/03/2013 0905   CREATININE 1.2 08/03/2013 0905   CALCIUM 9.2 08/03/2013 0905   GFRNONAA 62* 07/13/2013 1040   GFRAA 72* 07/13/2013 1040    Lipid Panel     Component Value Date/Time   CHOL 169 08/03/2013 0905   TRIG 174.0* 08/03/2013 0905   HDL 30.90* 08/03/2013 0905   CHOLHDL 5 08/03/2013 0905   VLDL 34.8 08/03/2013 0905   LDLCALC 103* 08/03/2013 0905    CBC    Component Value Date/Time   WBC 7.0 07/13/2013 1040   RBC 4.56 07/13/2013 1040   HGB 13.6 07/13/2013 1040   HCT 39.7 07/13/2013 1040   PLT 150 07/13/2013 1040   MCV 87.1 07/13/2013 1040   MCH 29.8 07/13/2013 1040   MCHC 34.3 07/13/2013 1040   RDW 12.9 07/13/2013 1040   LYMPHSABS 1.3 10/25/2008 2127   MONOABS 0.5 10/25/2008 2127   EOSABS 0.1 10/25/2008 2127   BASOSABS 0.0 10/25/2008 2127    Hgb A1C Lab Results  Component Value Date   HGBA1C 6.7* 08/03/2013        Assessment & Plan:   Dizziness secondary to hypotension:  Continue to hold all BP medications Monitor BP at home, if sustained > 140/90 will need to return sooner Orthostatics did not show a drop in blood pressure Drink plenty of fluids, especially when working outside Will check CBC and CMET today  Follow up in 1 month with your PCP

## 2013-12-30 ENCOUNTER — Encounter: Payer: Self-pay | Admitting: Family Medicine

## 2014-01-21 ENCOUNTER — Other Ambulatory Visit: Payer: Self-pay | Admitting: *Deleted

## 2014-01-21 MED ORDER — NIACIN ER (ANTIHYPERLIPIDEMIC) 500 MG PO TBCR: 500.0000 mg | EXTENDED_RELEASE_TABLET | Freq: Every day | ORAL | Status: DC

## 2014-02-15 ENCOUNTER — Other Ambulatory Visit: Payer: Self-pay | Admitting: *Deleted

## 2014-02-15 MED ORDER — CARVEDILOL 6.25 MG PO TABS: 6.2500 mg | ORAL_TABLET | Freq: Two times a day (BID) | ORAL | Status: DC

## 2014-03-15 ENCOUNTER — Telehealth: Payer: Self-pay | Admitting: Cardiology

## 2014-03-15 ENCOUNTER — Ambulatory Visit (INDEPENDENT_AMBULATORY_CARE_PROVIDER_SITE_OTHER): Payer: Medicare Other | Admitting: *Deleted

## 2014-03-15 DIAGNOSIS — I428 Other cardiomyopathies: Secondary | ICD-10-CM

## 2014-03-15 NOTE — Telephone Encounter (Signed)
LMOVM reminding pt to send remote transmission.   

## 2014-03-15 NOTE — Progress Notes (Signed)
Remote ICD transmission.   

## 2014-03-16 LAB — MDC_IDC_ENUM_SESS_TYPE_REMOTE
HighPow Impedance: 69 Ohm
HighPow Impedance: 72 Ohm
Implantable Pulse Generator Serial Number: 663191
Lead Channel Impedance Value: 400 Ohm
Lead Channel Setting Pacing Amplitude: 2.5 V
Lead Channel Setting Pacing Pulse Width: 0.8 ms
Lead Channel Setting Sensing Sensitivity: 0.3 mV
MDC IDC MSMT BATTERY REMAINING LONGEVITY: 42 mo
MDC IDC MSMT BATTERY VOLTAGE: 2.62 V
MDC IDC MSMT LEADCHNL RV SENSING INTR AMPL: 5.8 mV
MDC IDC SESS DTM: 20150817175048
MDC IDC SET ZONE DETECTION INTERVAL: 250 ms
MDC IDC SET ZONE DETECTION INTERVAL: 300 ms
MDC IDC SET ZONE DETECTION INTERVAL: 340 ms
MDC IDC STAT BRADY RV PERCENT PACED: 1 %

## 2014-03-22 ENCOUNTER — Encounter: Payer: Self-pay | Admitting: Family Medicine

## 2014-03-22 ENCOUNTER — Telehealth: Payer: Self-pay | Admitting: Family Medicine

## 2014-03-22 ENCOUNTER — Encounter (INDEPENDENT_AMBULATORY_CARE_PROVIDER_SITE_OTHER): Payer: Self-pay

## 2014-03-22 ENCOUNTER — Ambulatory Visit (INDEPENDENT_AMBULATORY_CARE_PROVIDER_SITE_OTHER): Payer: Medicare Other | Admitting: Family Medicine

## 2014-03-22 VITALS — BP 114/62 | HR 62 | Temp 98.2°F | Wt 198.0 lb

## 2014-03-22 DIAGNOSIS — E785 Hyperlipidemia, unspecified: Secondary | ICD-10-CM

## 2014-03-22 DIAGNOSIS — I952 Hypotension due to drugs: Secondary | ICD-10-CM | POA: Insufficient documentation

## 2014-03-22 DIAGNOSIS — E1129 Type 2 diabetes mellitus with other diabetic kidney complication: Secondary | ICD-10-CM

## 2014-03-22 DIAGNOSIS — E1121 Type 2 diabetes mellitus with diabetic nephropathy: Secondary | ICD-10-CM

## 2014-03-22 DIAGNOSIS — Z23 Encounter for immunization: Secondary | ICD-10-CM

## 2014-03-22 DIAGNOSIS — I959 Hypotension, unspecified: Secondary | ICD-10-CM

## 2014-03-22 DIAGNOSIS — I10 Essential (primary) hypertension: Secondary | ICD-10-CM

## 2014-03-22 DIAGNOSIS — N058 Unspecified nephritic syndrome with other morphologic changes: Secondary | ICD-10-CM

## 2014-03-22 LAB — COMPREHENSIVE METABOLIC PANEL
ALT: 14 U/L (ref 0–53)
AST: 22 U/L (ref 0–37)
Albumin: 4.1 g/dL (ref 3.5–5.2)
Alkaline Phosphatase: 47 U/L (ref 39–117)
BILIRUBIN TOTAL: 0.9 mg/dL (ref 0.2–1.2)
BUN: 18 mg/dL (ref 6–23)
CO2: 28 mEq/L (ref 19–32)
CREATININE: 1.2 mg/dL (ref 0.4–1.5)
Calcium: 9.1 mg/dL (ref 8.4–10.5)
Chloride: 104 mEq/L (ref 96–112)
GFR: 62.13 mL/min (ref >60.00)
GLUCOSE: 125 mg/dL — AB (ref 70–99)
Potassium: 4.6 mEq/L (ref 3.5–5.1)
Sodium: 139 mEq/L (ref 135–145)
Total Protein: 7.3 g/dL (ref 6.0–8.3)

## 2014-03-22 LAB — MICROALBUMIN / CREATININE URINE RATIO
Creatinine,U: 148.1 mg/dL
MICROALB UR: 21 mg/dL — AB (ref 0.0–1.9)
MICROALB/CREAT RATIO: 14.2 mg/g (ref 0.0–30.0)

## 2014-03-22 LAB — LDL CHOLESTEROL, DIRECT: Direct LDL: 118.8 mg/dL

## 2014-03-22 LAB — HEMOGLOBIN A1C: Hgb A1c MFr Bld: 6.8 % — ABNORMAL HIGH (ref 4.6–6.5)

## 2014-03-22 MED ORDER — GLIPIZIDE 5 MG PO TABS: 5.0000 mg | ORAL_TABLET | Freq: Every day | ORAL | Status: DC

## 2014-03-22 MED ORDER — LOVASTATIN 20 MG PO TABS: 20.0000 mg | ORAL_TABLET | Freq: Every day | ORAL | Status: DC

## 2014-03-22 MED ORDER — CARVEDILOL 6.25 MG PO TABS: 6.2500 mg | ORAL_TABLET | Freq: Two times a day (BID) | ORAL | Status: DC

## 2014-03-22 MED ORDER — TAMSULOSIN HCL 0.4 MG PO CAPS: 0.4000 mg | ORAL_CAPSULE | Freq: Every day | ORAL | Status: DC

## 2014-03-22 NOTE — Patient Instructions (Signed)
Great to see you. I will call you with your lab results.   

## 2014-03-22 NOTE — Assessment & Plan Note (Signed)
Followed by Dr. Juleen China. Check urine micro today since he stopped ACEI.

## 2014-03-22 NOTE — Progress Notes (Signed)
Pre visit review using our clinic review tool, if applicable. No additional management support is needed unless otherwise documented below in the visit note. 

## 2014-03-22 NOTE — Assessment & Plan Note (Signed)
Resolved. Likely mainly due to dehydration but he is low end of normal today. Asymptomatic.

## 2014-03-22 NOTE — Telephone Encounter (Signed)
Relevant patient education assigned to patient using Emmi. ° °

## 2014-03-22 NOTE — Progress Notes (Signed)
Subjective:    Patient ID: Randy [name], male    DOB: 28-Feb-1946, 68 y.o.   MRN: [id number]  HPI  68 yo pleasant male here for follow up.  Followed by cardiology- Dr. Lovena Le- has appt in October.  DM- on glipizide 5 mg daily.  Checks fasting CBGs three times weekly- usually 120s-140s  No recent h/o hypoglycemia. Lab Results  Component Value Date   HGBA1C 6.7* 08/03/2013   Was on lisinopril but stopped taking it when he saw Webb Silversmith on 12/28/13 for symptomatic hypotension- note reviewed.  Had gastroenteritis in the days prior.  Wt Readings from Last 3 Encounters:  03/22/14 198 lb (89.812 kg)  12/28/13 197 lb (89.359 kg)  08/03/13 198 lb 8 oz (90.039 kg)     Diabetic nephropathy- followed by Dr. Juleen China.  Last saw him in 05/2013- Cr. 1.21, stable.  He is followed by cardiology.  Takes Mevacor 20 mg daily along with Niaspan.  LDL close to goal for diabetic.  Lab Results  Component Value Date   CHOL 169 08/03/2013   HDL 30.90* 08/03/2013   LDLCALC 103* 08/03/2013   LDLDIRECT 137.7 01/06/2013   TRIG 174.0* 08/03/2013   CHOLHDL 5 08/03/2013   Current Outpatient Prescriptions on File Prior to Visit  Medication Sig Dispense Refill  . aspirin EC 81 MG tablet Take 81 mg by mouth daily.       No current facility-administered medications on file prior to visit.    No Known Allergies  Past Medical History  Diagnosis Date  . Dyslipidemia   . Type 2 diabetes with nephropathy   . CAD (coronary artery disease)   . Systolic dysfunction   . Vitamin D deficiency   . ICD (implantable cardiac defibrillator) in place     2009-St. Jude device  . Shortness of breath   . Pacemaker   . Sleep apnea     started a sleep study but didn't completed, states PCP told him he has sleep  apnea, doesn't use CPAP  . GERD (gastroesophageal reflux disease)   . Neuromuscular disorder     neuropathy- in hands  . Arthritis     hip, hands- Osteoarthritis    . Anemia   . Hypertension     g. taylor  .  CKD (chronic kidney disease) stage 3, GFR 30-59 ml/min     [name]    Past Surgical History  Procedure Laterality Date  . Left carpal tunnel release    . Left rotator cuff surgery    . Back surgery      lumbar & cervical fusion   . Insert / replace / remove pacemaker    . Total hip arthroplasty Left 09/17/2012    Dr Lorin Mercy  . Total hip arthroplasty Left 09/17/2012    Procedure: Left TOTAL HIP ARTHROPLASTY ANTERIOR APPROACH;  Surgeon: Marybelle Killings, MD;  Location: [place];  Service: Orthopedics;  Laterality: Left;  . Lumbar laminectomy Left 07/17/2013    Procedure: MICRODISCECTOMY LUMBAR LAMINECTOMY;  Surgeon: Marybelle Killings, MD;  Location: [place];  Service: Orthopedics;  Laterality: Left;  Left L4 Laminotomy, Lateral Recess Decompression, Cyst Excision    Family History  Problem Relation Age of Onset  . Heart attack Father 31  . Diabetes Brother     History   Social History  . Marital Status: Single    Spouse Name: N/A    Number of Children: 2  . Years of Education: N/A   Occupational History  . retired  floor covering   Social History Main Topics  . Smoking status: Former Smoker    Types: Cigarettes    Quit date: 07/30/1984  . Smokeless tobacco: Never Used  . Alcohol Use: Yes     Comment: occ  . Drug Use: No  . Sexual Activity: Not on file   Other Topics Concern  . Not on file   Social History Narrative   Lives with friend         The PMH, PSH, Social History, Family History, Medications, and allergies have been reviewed in [facility], and have been updated if relevant.      Review of Systems    See HPI  Dizziness has resolved No CP or SOB Objective:   Physical Exam BP 114/62  Pulse 62  Temp(Src) 98.2 F (36.8 C) (Oral)  Wt 198 lb (89.812 kg)  SpO2 96% General:  pleasant male in NAD Eyes:  PERRL Ears:  External ear exam shows no significant lesions or deformities.  Otoscopic examination reveals clear canals, tympanic membranes are intact bilaterally  without bulging, retraction, inflammation or discharge. Hearing is grossly normal bilaterally. Nose:  External nasal examination shows no deformity or inflammation. Nasal mucosa are pink and moist without lesions or exudates. Mouth:  Oral mucosa and oropharynx without lesions or exudates.  Teeth in good repair. Neck:  no carotid bruit or thyromegaly no cervical or supraclavicular lymphadenopathy  Lungs:  Normal respiratory effort, chest expands symmetrically. Lungs are clear to auscultation, no crackles or wheezes. Heart:  Normal rate and regular rhythm. S1 and S2 normal without gallop, murmur, click, rub or other extra sounds. Pulses:  R and L posterior tibial pulses are full and equal bilaterally       Assessment & Plan:

## 2014-03-22 NOTE — Assessment & Plan Note (Signed)
Stable on current rx but my concern is that he is off ACEI for renal protection.  On coreg (cardiology).  Will not restart ACEI today as based on current BP, he might become hypotensive.  Will check urine micro today first.

## 2014-03-22 NOTE — Assessment & Plan Note (Signed)
Deteriorated per pt. Will check a1c today.

## 2014-03-23 MED ORDER — LISINOPRIL 2.5 MG PO TABS: 2.5000 mg | ORAL_TABLET | Freq: Every day | ORAL | Status: DC

## 2014-03-23 NOTE — Addendum Note (Signed)
Addended by: Lucille Passy on: 03/23/2014 07:04 AM   Modules accepted: Orders

## 2014-03-25 ENCOUNTER — Encounter: Payer: Self-pay | Admitting: Cardiology

## 2014-04-01 ENCOUNTER — Encounter: Payer: Self-pay | Admitting: Internal Medicine

## 2014-04-13 ENCOUNTER — Ambulatory Visit (INDEPENDENT_AMBULATORY_CARE_PROVIDER_SITE_OTHER): Payer: Medicare Other | Admitting: Family Medicine

## 2014-04-13 ENCOUNTER — Encounter: Payer: Self-pay | Admitting: Family Medicine

## 2014-04-13 VITALS — BP 114/66 | HR 56 | Temp 97.8°F | Wt 197.0 lb

## 2014-04-13 DIAGNOSIS — I959 Hypotension, unspecified: Secondary | ICD-10-CM

## 2014-04-13 DIAGNOSIS — I10 Essential (primary) hypertension: Secondary | ICD-10-CM

## 2014-04-13 DIAGNOSIS — E785 Hyperlipidemia, unspecified: Secondary | ICD-10-CM

## 2014-04-13 NOTE — Assessment & Plan Note (Signed)
Resolved

## 2014-04-13 NOTE — Patient Instructions (Signed)
Great to see you. Please work on your diet and come back to see me in 3 months.

## 2014-04-13 NOTE — Assessment & Plan Note (Signed)
Well controlled.  No sx or symptoms of hypotension with lisinopril readded. No changes made today.

## 2014-04-13 NOTE — Progress Notes (Signed)
Subjective:    Patient ID: Randy [name], male    DOB: 20-Sep-1945, 68 y.o.   MRN: [id number]  HPI  68 yo pleasant male here for follow up.  DM- on glipizide 5 mg daily.  Checks fasting CBGs three times weekly- usually 120s-140s  No recent h/o hypoglycemia. Lab Results  Component Value Date   HGBA1C 6.8* 03/22/2014  When I saw him last month, urine microalbumin was positive, I advised him to restart low dose lisinopril for renal protection and follow up today.  Was on lisinopril but stopped taking it when he saw Webb Silversmith on 12/28/13 for symptomatic hypotension- note reviewed.  Had gastroenteritis in the days prior.  He denies any dizziness or symptoms of hypotension since restarting it.  Wt Readings from Last 3 Encounters:  04/13/14 197 lb (89.359 kg)  03/22/14 198 lb (89.812 kg)  12/28/13 197 lb (89.359 kg)    HLD- deteriorated. He is followed by cardiology.  Takes Mevacor 20 mg daily along with Niaspan.  Admits to not being as compliant with his diet.  Lab Results  Component Value Date   CHOL 169 08/03/2013   HDL 30.90* 08/03/2013   LDLCALC 103* 08/03/2013   LDLDIRECT 118.8 03/22/2014   TRIG 174.0* 08/03/2013   CHOLHDL 5 08/03/2013   Current Outpatient Prescriptions on File Prior to Visit  Medication Sig Dispense Refill  . aspirin EC 81 MG tablet Take 81 mg by mouth daily.      . carvedilol (COREG) 6.25 MG tablet Take 1 tablet (6.25 mg total) by mouth 2 (two) times daily with a meal.  60 tablet  2  . glipiZIDE (GLUCOTROL) 5 MG tablet Take 1 tablet (5 mg total) by mouth daily before breakfast.  30 tablet  2  . lisinopril (PRINIVIL,ZESTRIL) 2.5 MG tablet Take 1 tablet (2.5 mg total) by mouth daily.  30 tablet  1  . lovastatin (MEVACOR) 20 MG tablet Take 1 tablet (20 mg total) by mouth at bedtime.  30 tablet  2  . tamsulosin (FLOMAX) 0.4 MG CAPS capsule Take 1 capsule (0.4 mg total) by mouth daily.  30 capsule  2   No current facility-administered medications on file prior to visit.     No Known Allergies  Past Medical History  Diagnosis Date  . Dyslipidemia   . Type 2 diabetes with nephropathy   . CAD (coronary artery disease)   . Systolic dysfunction   . Vitamin D deficiency   . ICD (implantable cardiac defibrillator) in place     2009-St. Jude device  . Shortness of breath   . Pacemaker   . Sleep apnea     started a sleep study but didn't completed, states PCP told him he has sleep  apnea, doesn't use CPAP  . GERD (gastroesophageal reflux disease)   . Neuromuscular disorder     neuropathy- in hands  . Arthritis     hip, hands- Osteoarthritis    . Anemia   . Hypertension     g. taylor  . CKD (chronic kidney disease) stage 3, GFR 30-59 ml/min     [name]    Past Surgical History  Procedure Laterality Date  . Left carpal tunnel release    . Left rotator cuff surgery    . Back surgery      lumbar & cervical fusion   . Insert / replace / remove pacemaker    . Total hip arthroplasty Left 09/17/2012    Dr Lorin Mercy  . Total hip arthroplasty  Left 09/17/2012    Procedure: Left TOTAL HIP ARTHROPLASTY ANTERIOR APPROACH;  Surgeon: Marybelle Killings, MD;  Location: [place];  Service: Orthopedics;  Laterality: Left;  . Lumbar laminectomy Left 07/17/2013    Procedure: MICRODISCECTOMY LUMBAR LAMINECTOMY;  Surgeon: Marybelle Killings, MD;  Location: [place];  Service: Orthopedics;  Laterality: Left;  Left L4 Laminotomy, Lateral Recess Decompression, Cyst Excision    Family History  Problem Relation Age of Onset  . Heart attack Father 47  . Diabetes Brother     History   Social History  . Marital Status: Single    Spouse Name: N/A    Number of Children: 2  . Years of Education: N/A   Occupational History  . 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 Smoking status: Former Smoker    Types: Cigarettes    Quit date: 07/30/1984  . Smokeless tobacco: Never Used  . Alcohol Use: Yes     Comment: occ  . Drug Use: No  . Sexual Activity: Not on file    Other Topics Concern  . Not on file   Social History Narrative   Lives with friend         The PMH, PSH, Social History, Family History, Medications, and allergies have been reviewed in [facility], and have been updated if relevant.      Review of Systems    See HPI  Dizziness has resolved No CP or SOB Objective:   Physical Exam BP 114/66  Pulse 56  Temp(Src) 97.8 F (36.6 C) (Oral)  Wt 197 lb (89.359 kg)  SpO2 98% General:  pleasant male in NAD Eyes:  PERRL Ears:  External ear exam shows no significant lesions or deformities.  Otoscopic examination reveals clear canals, tympanic membranes are intact bilaterally without bulging, retraction, inflammation or discharge. Hearing is grossly normal bilaterally. Nose:  External nasal examination shows no deformity or inflammation. Nasal mucosa are pink and moist without lesions or exudates. Mouth:  Oral mucosa and oropharynx without lesions or exudates.  Teeth in good repair. Neck:  no carotid bruit or thyromegaly no cervical or supraclavicular lymphadenopathy  Lungs:  Normal respiratory effort, chest expands symmetrically. Lungs are clear to auscultation, no crackles or wheezes. Heart:  Normal rate and regular rhythm. S1 and S2 normal without gallop, murmur, click, rub or other extra sounds. Pulses:  R and L posterior tibial pulses are full and equal bilaterally       Assessment & Plan:

## 2014-04-13 NOTE — Progress Notes (Signed)
Pre visit review using our clinic review tool, if applicable. No additional management support is needed unless otherwise documented below in the visit note. 

## 2014-04-13 NOTE — Assessment & Plan Note (Signed)
Deteriorated. Handout out given on low cholesterol diet- he prefers not to increase Mevacor at this time. Follow up in 3 months. The patient indicates understanding of these issues and agrees with the plan.

## 2014-05-04 ENCOUNTER — Telehealth: Payer: Self-pay

## 2014-05-04 DIAGNOSIS — Z0100 Encounter for examination of eyes and vision without abnormal findings: Principal | ICD-10-CM

## 2014-05-04 DIAGNOSIS — E119 Type 2 diabetes mellitus without complications: Secondary | ICD-10-CM

## 2014-05-04 NOTE — Telephone Encounter (Signed)
Referral placed.

## 2014-05-04 NOTE — Telephone Encounter (Signed)
Pt stated that he has not had an eye exam this year.  He currently does not have an ophthalmologist.  Pt may need a referral.    Please advise.

## 2014-05-04 NOTE — Telephone Encounter (Signed)
At the time of call, pt was out in the woods looking for golf balls.  Left message for call back with wife.

## 2014-05-11 ENCOUNTER — Other Ambulatory Visit: Payer: Self-pay | Admitting: Family Medicine

## 2014-05-11 DIAGNOSIS — Z0100 Encounter for examination of eyes and vision without abnormal findings: Principal | ICD-10-CM

## 2014-05-11 DIAGNOSIS — E119 Type 2 diabetes mellitus without complications: Secondary | ICD-10-CM

## 2014-05-30 LAB — HM DIABETES EYE EXAM

## 2014-06-03 ENCOUNTER — Telehealth: Payer: Self-pay

## 2014-06-03 NOTE — Telephone Encounter (Signed)
Called patient, LM with wife for patient to call back to schedule Medicare CPX for 2015.

## 2014-06-04 ENCOUNTER — Encounter: Payer: Self-pay | Admitting: Family Medicine

## 2014-06-04 NOTE — Telephone Encounter (Signed)
Scheduled 06/22/14

## 2014-06-11 ENCOUNTER — Ambulatory Visit (INDEPENDENT_AMBULATORY_CARE_PROVIDER_SITE_OTHER): Payer: Medicare Other | Admitting: Internal Medicine

## 2014-06-11 ENCOUNTER — Encounter: Payer: Self-pay | Admitting: Internal Medicine

## 2014-06-11 ENCOUNTER — Other Ambulatory Visit: Payer: Self-pay | Admitting: Family Medicine

## 2014-06-11 VITALS — BP 110/58 | HR 63 | Ht 69.0 in | Wt 196.4 lb

## 2014-06-11 DIAGNOSIS — Z95810 Presence of automatic (implantable) cardiac defibrillator: Secondary | ICD-10-CM

## 2014-06-11 DIAGNOSIS — I5022 Chronic systolic (congestive) heart failure: Secondary | ICD-10-CM

## 2014-06-11 DIAGNOSIS — E1121 Type 2 diabetes mellitus with diabetic nephropathy: Secondary | ICD-10-CM

## 2014-06-11 DIAGNOSIS — I10 Essential (primary) hypertension: Secondary | ICD-10-CM

## 2014-06-11 DIAGNOSIS — Z125 Encounter for screening for malignant neoplasm of prostate: Secondary | ICD-10-CM

## 2014-06-11 DIAGNOSIS — E785 Hyperlipidemia, unspecified: Secondary | ICD-10-CM

## 2014-06-11 DIAGNOSIS — Z0000 Encounter for general adult medical examination without abnormal findings: Secondary | ICD-10-CM

## 2014-06-11 LAB — MDC_IDC_ENUM_SESS_TYPE_INCLINIC
Battery Remaining Longevity: 43.2 mo
Battery Voltage: 2.62 V
Date Time Interrogation Session: 20151113093816
HighPow Impedance: 67.5 Ohm
Implantable Pulse Generator Serial Number: 663191
Lead Channel Pacing Threshold Pulse Width: 0.8 ms
Lead Channel Setting Pacing Amplitude: 2.5 V
MDC IDC MSMT LEADCHNL RV IMPEDANCE VALUE: 412.5 Ohm
MDC IDC MSMT LEADCHNL RV PACING THRESHOLD AMPLITUDE: 1.5 V
MDC IDC MSMT LEADCHNL RV PACING THRESHOLD AMPLITUDE: 1.5 V
MDC IDC MSMT LEADCHNL RV PACING THRESHOLD PULSEWIDTH: 0.8 ms
MDC IDC MSMT LEADCHNL RV SENSING INTR AMPL: 5 mV
MDC IDC SET LEADCHNL RV PACING PULSEWIDTH: 0.8 ms
MDC IDC SET LEADCHNL RV SENSING SENSITIVITY: 0.3 mV
MDC IDC SET ZONE DETECTION INTERVAL: 340 ms
MDC IDC STAT BRADY RV PERCENT PACED: 0 %
Zone Setting Detection Interval: 250 ms
Zone Setting Detection Interval: 300 ms

## 2014-06-11 NOTE — Progress Notes (Signed)
HPI Randy [name] returns today for followup. He is a pleasant 68 yo man with a h/o HTN, chronic systolic CHF, s/p ICD implant. In the interim he has been stable. He has chronic exertional dyspnea which is class II. He denies chest pain or syncope. No ICD shocks.  No Known Allergies   Current Outpatient Prescriptions  Medication Sig Dispense Refill  . aspirin EC 81 MG tablet Take 81 mg by mouth daily.    . carvedilol (COREG) 6.25 MG tablet Take 1 tablet (6.25 mg total) by mouth 2 (two) times daily with a meal. 60 tablet 2  . glipiZIDE (GLUCOTROL) 5 MG tablet Take 1 tablet (5 mg total) by mouth daily before breakfast. 30 tablet 2  . lisinopril (PRINIVIL,ZESTRIL) 2.5 MG tablet Take 1 tablet (2.5 mg total) by mouth daily. 30 tablet 1  . lovastatin (MEVACOR) 20 MG tablet Take 1 tablet (20 mg total) by mouth at bedtime. 30 tablet 2  . tamsulosin (FLOMAX) 0.4 MG CAPS capsule Take 1 capsule (0.4 mg total) by mouth daily. 30 capsule 2   No current facility-administered medications for this visit.     Past Medical History  Diagnosis Date  . Dyslipidemia   . Type 2 diabetes with nephropathy   . CAD (coronary artery disease)   . Systolic dysfunction   . Vitamin D deficiency   . ICD (implantable cardiac defibrillator) in place     2009-St. Jude device  . Shortness of breath   . Pacemaker   . Sleep apnea     started a sleep study but didn't completed, states PCP told him he has sleep  apnea, doesn't use CPAP  . GERD (gastroesophageal reflux disease)   . Neuromuscular disorder     neuropathy- in hands  . Arthritis     hip, hands- Osteoarthritis    . Anemia   . Hypertension     Randy [name]  . CKD (chronic kidney disease) stage 3, GFR 30-59 ml/min     Randy [name]    ROS:   All systems reviewed and negative except as noted in the HPI.   Past Surgical History  Procedure Laterality Date  . Left carpal tunnel release    . Left rotator cuff surgery    . Back surgery      lumbar &  cervical fusion   . Insert / replace / remove pacemaker    . Total hip arthroplasty Left 09/17/2012    Dr Lorin Mercy  . Total hip arthroplasty Left 09/17/2012    Procedure: Left TOTAL HIP ARTHROPLASTY ANTERIOR APPROACH;  Surgeon: Marybelle Killings, MD;  Location: [place];  Service: Orthopedics;  Laterality: Left;  . Lumbar laminectomy Left 07/17/2013    Procedure: MICRODISCECTOMY LUMBAR LAMINECTOMY;  Surgeon: Marybelle Killings, MD;  Location: [place];  Service: Orthopedics;  Laterality: Left;  Left L4 Laminotomy, Lateral Recess Decompression, Cyst Excision     Family History  Problem Relation Age of Onset  . Heart attack Father 42  . Diabetes Brother      History   Social History  . Marital Status: Single    Spouse Name: N/A    Number of Children: 2  . Years of Education: N/A   Occupational History  . 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 Smoking status: Former Smoker    Types: Cigarettes    Quit date: 07/30/1984  . Smokeless tobacco: Never Used  . Alcohol Use: Yes  Comment: occ  . Drug Use: No  . Sexual Activity: Not on file   Other Topics Concern  . Not on file   Social History Narrative   Lives with friend           BP 110/58 mmHg  Pulse 63  Ht 5\' 9"  (1.753 m)  Wt 196 lb 6.4 oz (89.086 kg)  BMI 28.99 kg/m2  Physical Exam:  Well appearing 68 year old man,NAD HEENT: Unremarkable Neck:  No JVD, no thyromegally Back:  No CVA tenderness Lungs:  Clear with no wheezes, rales, or rhonchi. HEART:  Regular rate rhythm, no murmurs, no rubs, no clicks Abd:  soft, positive bowel sounds, no organomegally, no rebound, no guarding Ext:  2 plus pulses, no edema, no cyanosis, no clubbing Skin:  No rashes no nodules Neuro:  CN II through XII intact, motor grossly intact  DEVICE  Normal device function.  See PaceArt for details.   Assess/Plan:

## 2014-06-11 NOTE — Assessment & Plan Note (Signed)
His symptoms remain class 2a. He will continue his current medications. He is encouraged to continue his active lifestyle and maintain a low-sodium diet.

## 2014-06-11 NOTE — Assessment & Plan Note (Signed)
His St. Jude single-chamber ICD is working normally. He has approximately 3 years of battery longevity. We'll plan to recheck in several months.

## 2014-06-11 NOTE — Patient Instructions (Signed)
Your physician wants you to follow-up in: 12 months with Dr. Lovena Le.  You will receive a reminder letter in the mail two months in advance. If you don't receive a letter, please call our office to schedule the follow-up appointment.   Remote monitoring is used to monitor your Pacemaker of ICD from home. This monitoring reduces the number of office visits required to check your device to one time per year. It allows [place] to keep an eye on the functioning of your device to ensure it is working properly. You are scheduled for a device check from home on 09/13/14 You may send your transmission at any time that day. If you have a wireless device, the transmission will be sent automatically. After your physician reviews your transmission, you will receive a postcard with your next transmission date.

## 2014-06-11 NOTE — Assessment & Plan Note (Signed)
His blood pressure remains well controlled. He will continue his current medications.

## 2014-06-14 ENCOUNTER — Other Ambulatory Visit: Payer: Self-pay | Admitting: *Deleted

## 2014-06-14 MED ORDER — CARVEDILOL 6.25 MG PO TABS: 6.2500 mg | ORAL_TABLET | Freq: Two times a day (BID) | ORAL | Status: DC

## 2014-06-14 MED ORDER — LISINOPRIL 2.5 MG PO TABS: 2.5000 mg | ORAL_TABLET | Freq: Every day | ORAL | Status: DC

## 2014-06-15 ENCOUNTER — Other Ambulatory Visit (INDEPENDENT_AMBULATORY_CARE_PROVIDER_SITE_OTHER): Payer: Medicare Other

## 2014-06-15 DIAGNOSIS — Z125 Encounter for screening for malignant neoplasm of prostate: Secondary | ICD-10-CM

## 2014-06-15 DIAGNOSIS — E1121 Type 2 diabetes mellitus with diabetic nephropathy: Secondary | ICD-10-CM

## 2014-06-15 DIAGNOSIS — Z0000 Encounter for general adult medical examination without abnormal findings: Secondary | ICD-10-CM

## 2014-06-15 DIAGNOSIS — E785 Hyperlipidemia, unspecified: Secondary | ICD-10-CM

## 2014-06-15 LAB — CBC WITH DIFFERENTIAL/PLATELET
BASOS PCT: 0.8 % (ref 0.0–3.0)
Basophils Absolute: 0 10*3/uL (ref 0.0–0.1)
EOS PCT: 2.4 % (ref 0.0–5.0)
Eosinophils Absolute: 0.1 10*3/uL (ref 0.0–0.7)
HCT: 39.1 % (ref 39.0–52.0)
HEMOGLOBIN: 12.9 g/dL — AB (ref 13.0–17.0)
Lymphocytes Relative: 22.5 % (ref 12.0–46.0)
Lymphs Abs: 1.2 10*3/uL (ref 0.7–4.0)
MCHC: 32.9 g/dL (ref 30.0–36.0)
MCV: 87.9 fl (ref 78.0–100.0)
MONO ABS: 0.5 10*3/uL (ref 0.1–1.0)
Monocytes Relative: 9.1 % (ref 3.0–12.0)
NEUTROS ABS: 3.5 10*3/uL (ref 1.4–7.7)
NEUTROS PCT: 65.2 % (ref 43.0–77.0)
Platelets: 145 10*3/uL — ABNORMAL LOW (ref 150.0–400.0)
RBC: 4.45 Mil/uL (ref 4.22–5.81)
RDW: 13.5 % (ref 11.5–15.5)
WBC: 5.4 10*3/uL (ref 4.0–10.5)

## 2014-06-15 LAB — LIPID PANEL
CHOLESTEROL: 202 mg/dL — AB (ref 0–200)
HDL: 35.2 mg/dL — ABNORMAL LOW (ref >39.00)
LDL Cholesterol: 128 mg/dL — ABNORMAL HIGH (ref 0–99)
NonHDL: 166.8
TRIGLYCERIDES: 195 mg/dL — AB (ref 0.0–149.0)
Total CHOL/HDL Ratio: 6
VLDL: 39 mg/dL (ref 0.0–40.0)

## 2014-06-15 LAB — COMPREHENSIVE METABOLIC PANEL
ALBUMIN: 4.3 g/dL (ref 3.5–5.2)
ALT: 14 U/L (ref 0–53)
AST: 21 U/L (ref 0–37)
Alkaline Phosphatase: 50 U/L (ref 39–117)
BUN: 19 mg/dL (ref 6–23)
CALCIUM: 9 mg/dL (ref 8.4–10.5)
CHLORIDE: 108 meq/L (ref 96–112)
CO2: 23 mEq/L (ref 19–32)
CREATININE: 1.3 mg/dL (ref 0.4–1.5)
GFR: 57.73 mL/min — ABNORMAL LOW (ref >60.00)
Glucose, Bld: 134 mg/dL — ABNORMAL HIGH (ref 70–99)
POTASSIUM: 5 meq/L (ref 3.5–5.1)
Sodium: 140 mEq/L (ref 135–145)
Total Bilirubin: 0.4 mg/dL (ref 0.2–1.2)
Total Protein: 6.9 g/dL (ref 6.0–8.3)

## 2014-06-15 LAB — HEMOGLOBIN A1C: Hgb A1c MFr Bld: 6.7 % — ABNORMAL HIGH (ref 4.6–6.5)

## 2014-06-15 LAB — PSA: PSA: 1.34 ng/mL (ref 0.10–4.00)

## 2014-06-22 ENCOUNTER — Encounter: Payer: Self-pay | Admitting: Family Medicine

## 2014-06-22 ENCOUNTER — Ambulatory Visit (INDEPENDENT_AMBULATORY_CARE_PROVIDER_SITE_OTHER): Payer: Medicare Other | Admitting: Family Medicine

## 2014-06-22 VITALS — BP 132/78 | HR 59 | Temp 98.0°F | Ht 67.5 in | Wt 197.0 lb

## 2014-06-22 DIAGNOSIS — Z95810 Presence of automatic (implantable) cardiac defibrillator: Secondary | ICD-10-CM

## 2014-06-22 DIAGNOSIS — Z0000 Encounter for general adult medical examination without abnormal findings: Secondary | ICD-10-CM

## 2014-06-22 DIAGNOSIS — M159 Polyosteoarthritis, unspecified: Secondary | ICD-10-CM | POA: Insufficient documentation

## 2014-06-22 DIAGNOSIS — E0840 Diabetes mellitus due to underlying condition with diabetic neuropathy, unspecified: Secondary | ICD-10-CM | POA: Insufficient documentation

## 2014-06-22 DIAGNOSIS — E785 Hyperlipidemia, unspecified: Secondary | ICD-10-CM

## 2014-06-22 DIAGNOSIS — E1121 Type 2 diabetes mellitus with diabetic nephropathy: Secondary | ICD-10-CM

## 2014-06-22 DIAGNOSIS — I5022 Chronic systolic (congestive) heart failure: Secondary | ICD-10-CM

## 2014-06-22 DIAGNOSIS — I10 Essential (primary) hypertension: Secondary | ICD-10-CM

## 2014-06-22 DIAGNOSIS — Z23 Encounter for immunization: Secondary | ICD-10-CM

## 2014-06-22 DIAGNOSIS — E0843 Diabetes mellitus due to underlying condition with diabetic autonomic (poly)neuropathy: Secondary | ICD-10-CM

## 2014-06-22 DIAGNOSIS — N183 Chronic kidney disease, stage 3 unspecified: Secondary | ICD-10-CM

## 2014-06-22 MED ORDER — GABAPENTIN 300 MG PO CAPS: 300.0000 mg | ORAL_CAPSULE | Freq: Every day | ORAL | Status: DC

## 2014-06-22 NOTE — Addendum Note (Signed)
Addended by: Modena Nunnery on: 06/22/2014 08:28 AM   Modules accepted: Orders

## 2014-06-22 NOTE — Progress Notes (Signed)
Subjective:    Patient ID: Randy [name], male    DOB: 07-25-1946, 68 y.o.   MRN: [id number]  HPI  68 yo pleasant male here for medicare wellness visit.  I have personally reviewed the Medicare Annual Wellness questionnaire and have noted 1. The patient's medical and social history 2. Their use of alcohol, tobacco or illicit drugs 3. Their current medications and supplements 4. The patient's functional ability including ADL's, fall risks, home safety risks and hearing or visual             impairment. 5. Diet and physical activities 6. Evidence for depression or mood disorders  End of life wishes discussed and updated in Social History.  The roster of all physicians providing medical care to patient - is listed in the Snapshot section of the chart. Td 01/14/08 Zoster 01/11/10 Flu 03/22/14 Colonoscopy 02/06/10- Dr. Sharlett Iles- 10 year recall Dilated eye exam- 05/30/14- Dr. Valetta Close, some retinopathy detected, suggested monitoring only.  DM- on glipizide 5 mg daily.  Checks fasting CBGs three times weekly- usually 120s-130s  No recent h/o hypoglycemia. Lab Results  Component Value Date   HGBA1C 6.7* 06/15/2014  Pos urine micro in 02/2014. On lisinopril 2.5 mg daily.  Wt Readings from Last 3 Encounters:  06/22/14 197 lb (89.359 kg)  06/11/14 196 lb 6.4 oz (89.086 kg)  04/13/14 197 lb (89.359 kg)   Has noticed more numbness/burning in his feet lately.  Has never taken anything for neuropathy.  HLD- deteriorated. Not at goal for diabetic.  Takes Mevacor 20 mg daily along with Niaspan.  Admits to not being as compliant with his diet.  Last saw cardiologist (Dr. Lovena Le) on 06/11/14- note reviewed- no changes made. Wt Readings from Last 3 Encounters:  06/22/14 197 lb (89.359 kg)  06/11/14 196 lb 6.4 oz (89.086 kg)  04/13/14 197 lb (89.359 kg)    Lab Results  Component Value Date   CHOL 202* 06/15/2014   HDL 35.20* 06/15/2014   LDLCALC 128* 06/15/2014   LDLDIRECT 118.8  03/22/2014   TRIG 195.0* 06/15/2014   CHOLHDL 6 06/15/2014   Lab Results  Component Value Date   PSA 1.34 06/15/2014   PSA 1.15 01/06/2013   PSA 1.45 09/27/2009    Current Outpatient Prescriptions on File Prior to Visit  Medication Sig Dispense Refill  . aspirin EC 81 MG tablet Take 81 mg by mouth daily.    . carvedilol (COREG) 6.25 MG tablet Take 1 tablet (6.25 mg total) by mouth 2 (two) times daily with a meal. 60 tablet 2  . glipiZIDE (GLUCOTROL) 5 MG tablet Take 1 tablet (5 mg total) by mouth daily before breakfast. 30 tablet 2  . lisinopril (PRINIVIL,ZESTRIL) 2.5 MG tablet Take 1 tablet (2.5 mg total) by mouth daily. 30 tablet 2  . lovastatin (MEVACOR) 20 MG tablet Take 1 tablet (20 mg total) by mouth at bedtime. 30 tablet 2  . tamsulosin (FLOMAX) 0.4 MG CAPS capsule Take 1 capsule (0.4 mg total) by mouth daily. 30 capsule 2   No current facility-administered medications on file prior to visit.    No Known Allergies  Past Medical History  Diagnosis Date  . Dyslipidemia   . Type 2 diabetes with nephropathy   . CAD (coronary artery disease)   . Systolic dysfunction   . Vitamin D deficiency   . ICD (implantable cardiac defibrillator) in place     2009-St. Jude device  . Shortness of breath   . Pacemaker   . Sleep  apnea     started a sleep study but didn't completed, states PCP told him he has sleep  apnea, doesn't use CPAP  . GERD (gastroesophageal reflux disease)   . Neuromuscular disorder     neuropathy- in hands  . Arthritis     hip, hands- Osteoarthritis    . Anemia   . Hypertension     g. taylor  . CKD (chronic kidney disease) stage 3, GFR 30-59 ml/min     [name]    Past Surgical History  Procedure Laterality Date  . Left carpal tunnel release    . Left rotator cuff surgery    . Back surgery      lumbar & cervical fusion   . Insert / replace / remove pacemaker    . Total hip arthroplasty Left 09/17/2012    Dr Lorin Mercy  . Total hip arthroplasty Left  09/17/2012    Procedure: Left TOTAL HIP ARTHROPLASTY ANTERIOR APPROACH;  Surgeon: Marybelle Killings, MD;  Location: [place];  Service: Orthopedics;  Laterality: Left;  . Lumbar laminectomy Left 07/17/2013    Procedure: MICRODISCECTOMY LUMBAR LAMINECTOMY;  Surgeon: Marybelle Killings, MD;  Location: [place];  Service: Orthopedics;  Laterality: Left;  Left L4 Laminotomy, Lateral Recess Decompression, Cyst Excision    Family History  Problem Relation Age of Onset  . Heart attack Father 57  . Diabetes Brother     History   Social History  . Marital Status: Single    Spouse Name: N/A    Number of Children: 2  . Years of Education: N/A   Occupational History  . 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 Smoking status: Former Smoker    Types: Cigarettes    Quit date: 07/30/1984  . Smokeless tobacco: Never Used  . Alcohol Use: Yes     Comment: occ  . Drug Use: No  . Sexual Activity: Not on file   Other Topics Concern  . Not on file   Social History Narrative   Lives with friend         The PMH, PSH, Social History, Family History, Medications, and allergies have been reviewed in [facility], and have been updated if relevant.      Review of Systems    See HPI  No changes in his bowel habits No blood in stool No SI or HI No CP or SOB No LE edema No abdominal pain No nausea or vomiting +bilateral hand pain, bilateral foot numbness Objective:   Physical Exam BP 132/78 mmHg  Pulse 59  Temp(Src) 98 F (36.7 C) (Oral)  Ht 5' 7.5" (1.715 m)  Wt 197 lb (89.359 kg)  BMI 30.38 kg/m2  SpO2 97% General:  pleasant male in NAD Eyes:  PERRL Ears:  External ear exam shows no significant lesions or deformities.  Otoscopic examination reveals clear canals, tympanic membranes are intact bilaterally without bulging, retraction, inflammation or discharge. Hearing is grossly normal bilaterally. Nose:  External nasal examination shows no deformity or inflammation. Nasal mucosa are  pink and moist without lesions or exudates. Mouth:  Oral mucosa and oropharynx without lesions or exudates.  Teeth in good repair. Neck:  no carotid bruit or thyromegaly no cervical or supraclavicular lymphadenopathy  Lungs:  Normal respiratory effort, chest expands symmetrically. Lungs are clear to auscultation, no crackles or wheezes. Heart:  Normal rate and regular rhythm. S1 and S2 normal without gallop, murmur, click, rub or other extra sounds. Pulses:  R and L posterior tibial pulses are full and equal bilaterally       Assessment & Plan:

## 2014-06-22 NOTE — Assessment & Plan Note (Signed)
Cr has been stable.  

## 2014-06-22 NOTE — Assessment & Plan Note (Signed)
Not at goal for diabetic. He wants to work on his diet. Continue current dose of Mevacor. Repeat in labs in 2 months.

## 2014-06-22 NOTE — Assessment & Plan Note (Signed)
The patients weight, height, BMI and visual acuity have been recorded in the chart I have made referrals, counseling and provided education to the patient based review of the above and I have provided the pt with a written personalized care plan for preventive services.  Prevnar 13 today.

## 2014-06-22 NOTE — Assessment & Plan Note (Signed)
Well controlled. No changes made today. 

## 2014-06-22 NOTE — Patient Instructions (Addendum)
Happy Holidays. Come see me after the holidays to recheck your cholesterol.  We are starting Gabapentin (neurontin) 300 mg at bedtime as needed. Update me with your symptoms- we can increase the dose as tolerated.

## 2014-06-22 NOTE — Assessment & Plan Note (Signed)
Discuss tx options. He would like to try gabapentin. Advised starting 300 mg qhs prn due to side effects.  We can titrate up dose. Call or return to clinic prn if these symptoms worsen or fail to improve as anticipated. The patient indicates understanding of these issues and agrees with the plan.

## 2014-06-22 NOTE — Progress Notes (Signed)
Pre visit review using our clinic review tool, if applicable. No additional management support is needed unless otherwise documented below in the visit note. 

## 2014-06-22 NOTE — Assessment & Plan Note (Signed)
Followed by cardiology 

## 2014-07-20 ENCOUNTER — Other Ambulatory Visit: Payer: Self-pay

## 2014-07-20 MED ORDER — GLIPIZIDE 5 MG PO TABS: 5.0000 mg | ORAL_TABLET | Freq: Every day | ORAL | Status: DC

## 2014-07-20 MED ORDER — TAMSULOSIN HCL 0.4 MG PO CAPS: 0.4000 mg | ORAL_CAPSULE | Freq: Every day | ORAL | Status: DC

## 2014-07-20 NOTE — Telephone Encounter (Signed)
Pt left v/m requesting 90 day refill on glipizide and tamsulosin to [facility]. Pt notified refills done and pt will cb to schedule lab appt end of Jan.

## 2014-07-28 ENCOUNTER — Other Ambulatory Visit: Payer: Self-pay | Admitting: *Deleted

## 2014-07-28 MED ORDER — LOVASTATIN 20 MG PO TABS: 20.0000 mg | ORAL_TABLET | Freq: Every day | ORAL | Status: DC

## 2014-09-13 ENCOUNTER — Ambulatory Visit (INDEPENDENT_AMBULATORY_CARE_PROVIDER_SITE_OTHER): Payer: Medicare Other | Admitting: *Deleted

## 2014-09-13 ENCOUNTER — Encounter: Payer: Self-pay | Admitting: Internal Medicine

## 2014-09-13 DIAGNOSIS — Z95810 Presence of automatic (implantable) cardiac defibrillator: Secondary | ICD-10-CM | POA: Diagnosis not present

## 2014-09-13 LAB — MDC_IDC_ENUM_SESS_TYPE_REMOTE
Brady Statistic RV Percent Paced: 1 %
HighPow Impedance: 77 Ohm
HighPow Impedance: 77 Ohm
Lead Channel Pacing Threshold Amplitude: 1.5 V
Lead Channel Setting Pacing Amplitude: 2.5 V
Lead Channel Setting Pacing Pulse Width: 0.8 ms
Lead Channel Setting Sensing Sensitivity: 0.3 mV
MDC IDC MSMT BATTERY REMAINING LONGEVITY: 42 mo
MDC IDC MSMT BATTERY VOLTAGE: 2.62 V
MDC IDC MSMT LEADCHNL RV IMPEDANCE VALUE: 430 Ohm
MDC IDC MSMT LEADCHNL RV PACING THRESHOLD PULSEWIDTH: 0.8 ms
MDC IDC MSMT LEADCHNL RV SENSING INTR AMPL: 6 mV
MDC IDC PG SERIAL: 663191
MDC IDC SESS DTM: 20160215071839
Zone Setting Detection Interval: 250 ms
Zone Setting Detection Interval: 300 ms
Zone Setting Detection Interval: 340 ms

## 2014-09-13 NOTE — Progress Notes (Signed)
Remote ICD transmission.   

## 2014-09-15 DIAGNOSIS — L089 Local infection of the skin and subcutaneous tissue, unspecified: Secondary | ICD-10-CM | POA: Diagnosis not present

## 2014-09-16 ENCOUNTER — Encounter: Payer: Self-pay | Admitting: Cardiology

## 2014-09-20 ENCOUNTER — Other Ambulatory Visit: Payer: Self-pay | Admitting: *Deleted

## 2014-09-20 MED ORDER — LOVASTATIN 20 MG PO TABS: 20.0000 mg | ORAL_TABLET | Freq: Every day | ORAL | Status: DC

## 2014-09-27 ENCOUNTER — Other Ambulatory Visit: Payer: Self-pay | Admitting: Family Medicine

## 2014-09-27 ENCOUNTER — Other Ambulatory Visit (INDEPENDENT_AMBULATORY_CARE_PROVIDER_SITE_OTHER): Payer: Medicare Other

## 2014-09-27 DIAGNOSIS — E785 Hyperlipidemia, unspecified: Secondary | ICD-10-CM | POA: Diagnosis not present

## 2014-09-27 DIAGNOSIS — E0843 Diabetes mellitus due to underlying condition with diabetic autonomic (poly)neuropathy: Secondary | ICD-10-CM

## 2014-09-27 LAB — COMPREHENSIVE METABOLIC PANEL
ALT: 11 U/L (ref 0–53)
AST: 17 U/L (ref 0–37)
Albumin: 4.2 g/dL (ref 3.5–5.2)
Alkaline Phosphatase: 49 U/L (ref 39–117)
BUN: 20 mg/dL (ref 6–23)
CHLORIDE: 102 meq/L (ref 96–112)
CO2: 30 meq/L (ref 19–32)
CREATININE: 1.17 mg/dL (ref 0.40–1.50)
Calcium: 9.1 mg/dL (ref 8.4–10.5)
GFR: 65.72 mL/min (ref >60.00)
Glucose, Bld: 123 mg/dL — ABNORMAL HIGH (ref 70–99)
POTASSIUM: 4.4 meq/L (ref 3.5–5.1)
Sodium: 137 mEq/L (ref 135–145)
Total Bilirubin: 0.6 mg/dL (ref 0.2–1.2)
Total Protein: 6.9 g/dL (ref 6.0–8.3)

## 2014-09-27 LAB — LIPID PANEL
CHOLESTEROL: 167 mg/dL (ref 0–200)
HDL: 30 mg/dL — AB (ref >39.00)
LDL Cholesterol: 101 mg/dL — ABNORMAL HIGH (ref 0–99)
NonHDL: 137
Total CHOL/HDL Ratio: 6
Triglycerides: 182 mg/dL — ABNORMAL HIGH (ref 0.0–149.0)
VLDL: 36.4 mg/dL (ref 0.0–40.0)

## 2014-09-27 LAB — HEMOGLOBIN A1C: Hgb A1c MFr Bld: 6.9 % — ABNORMAL HIGH (ref 4.6–6.5)

## 2014-10-12 ENCOUNTER — Other Ambulatory Visit: Payer: Self-pay | Admitting: *Deleted

## 2014-10-12 ENCOUNTER — Telehealth: Payer: Self-pay | Admitting: *Deleted

## 2014-10-12 MED ORDER — TAMSULOSIN HCL 0.4 MG PO CAPS: 2.0000 mg | ORAL_CAPSULE | Freq: Every day | ORAL | Status: DC

## 2014-10-12 MED ORDER — GLIPIZIDE 5 MG PO TABS: 5.0000 mg | ORAL_TABLET | Freq: Every day | ORAL | Status: DC

## 2014-10-18 MED ORDER — TAMSULOSIN HCL 0.4 MG PO CAPS: 0.4000 mg | ORAL_CAPSULE | Freq: Every day | ORAL | Status: DC

## 2014-10-18 NOTE — Addendum Note (Signed)
Addended by: Modena Nunnery on: 10/18/2014 03:47 PM   Modules accepted: Orders

## 2014-10-18 NOTE — Telephone Encounter (Signed)
New Rx sent to pharmacy

## 2014-10-18 NOTE — Telephone Encounter (Signed)
[facility] left v/m requesting cb to verify instructions for tamsulosin.

## 2014-11-23 ENCOUNTER — Other Ambulatory Visit: Payer: Self-pay | Admitting: *Deleted

## 2014-11-23 MED ORDER — LISINOPRIL 2.5 MG PO TABS: 2.5000 mg | ORAL_TABLET | Freq: Every day | ORAL | Status: DC

## 2014-11-30 ENCOUNTER — Telehealth: Payer: Self-pay | Admitting: *Deleted

## 2014-11-30 NOTE — Telephone Encounter (Signed)
Spoke to pt and advised per Dr Deborra Medina. Pt verbally expressed understanding and will contact cardiology

## 2014-11-30 NOTE — Telephone Encounter (Signed)
He needs to check with his cardiologist.

## 2014-11-30 NOTE — Telephone Encounter (Signed)
Patient left a voicemail wanting to know if he is suppose to be taking Carvedilol? Patient stated that he called in a refill request to his pharmacy and they have not heard back and was told to contact the doctor's office. Patient wants to know if he was taken off of this and if so wants his medication list updated.  Patient request a call back.

## 2014-12-02 NOTE — Addendum Note (Signed)
Addended by: Helene Shoe on: 12/02/2014 11:41 AM   Modules accepted: Orders

## 2014-12-02 NOTE — Telephone Encounter (Signed)
I find this very inappropriate since he is being managed by cardiology for heart failure and monitoring his AICD. According to last cardiology note (06/11/14) and his diagnosis of heart failure, yes he should continue carvedilol. Ok to refill since cardiology is refusing.

## 2014-12-02 NOTE — Telephone Encounter (Signed)
Pt left v/m; pt contacted cardiologist and cardiologist has never filled carvedilol and advised pt to contact Dr Deborra Medina for refill to [facility]. Last seen 06/22/14.

## 2014-12-13 ENCOUNTER — Ambulatory Visit (INDEPENDENT_AMBULATORY_CARE_PROVIDER_SITE_OTHER): Payer: Medicare Other | Admitting: *Deleted

## 2014-12-13 ENCOUNTER — Encounter: Payer: Self-pay | Admitting: Internal Medicine

## 2014-12-13 DIAGNOSIS — Z95810 Presence of automatic (implantable) cardiac defibrillator: Secondary | ICD-10-CM

## 2014-12-13 NOTE — Progress Notes (Signed)
Remote ICD transmission.   

## 2014-12-16 LAB — CUP PACEART REMOTE DEVICE CHECK
Brady Statistic RV Percent Paced: 1 %
Date Time Interrogation Session: 20160516131831
HighPow Impedance: 68 Ohm
HighPow Impedance: 70 Ohm
Lead Channel Pacing Threshold Amplitude: 1.5 V
Lead Channel Sensing Intrinsic Amplitude: 4.8 mV
MDC IDC MSMT BATTERY REMAINING LONGEVITY: 41 mo
MDC IDC MSMT BATTERY VOLTAGE: 2.6 V
MDC IDC MSMT LEADCHNL RV IMPEDANCE VALUE: 390 Ohm
MDC IDC MSMT LEADCHNL RV PACING THRESHOLD PULSEWIDTH: 0.8 ms
MDC IDC SET LEADCHNL RV PACING AMPLITUDE: 2.5 V
MDC IDC SET LEADCHNL RV PACING PULSEWIDTH: 0.8 ms
MDC IDC SET LEADCHNL RV SENSING SENSITIVITY: 0.3 mV
MDC IDC SET ZONE DETECTION INTERVAL: 340 ms
Pulse Gen Serial Number: 663191
Zone Setting Detection Interval: 250 ms
Zone Setting Detection Interval: 300 ms

## 2014-12-29 ENCOUNTER — Other Ambulatory Visit: Payer: Self-pay | Admitting: *Deleted

## 2014-12-29 ENCOUNTER — Encounter: Payer: Self-pay | Admitting: Cardiology

## 2014-12-29 MED ORDER — LOVASTATIN 20 MG PO TABS: 20.0000 mg | ORAL_TABLET | Freq: Every day | ORAL | Status: DC

## 2015-02-10 ENCOUNTER — Telehealth: Payer: Self-pay

## 2015-02-10 NOTE — Telephone Encounter (Signed)
Nurse with [facility] left v/m requesting cb; does pt have heart failure. Left v/m per instruction that pt has chronic systolic heart failure listed on problem list.

## 2015-03-03 ENCOUNTER — Encounter (INDEPENDENT_AMBULATORY_CARE_PROVIDER_SITE_OTHER): Payer: Self-pay

## 2015-03-03 ENCOUNTER — Ambulatory Visit (INDEPENDENT_AMBULATORY_CARE_PROVIDER_SITE_OTHER): Payer: Medicare Other | Admitting: Family Medicine

## 2015-03-03 ENCOUNTER — Encounter: Payer: Self-pay | Admitting: *Deleted

## 2015-03-03 ENCOUNTER — Encounter: Payer: Self-pay | Admitting: Family Medicine

## 2015-03-03 VITALS — BP 124/62 | HR 61 | Temp 98.0°F | Wt 195.5 lb

## 2015-03-03 DIAGNOSIS — E0843 Diabetes mellitus due to underlying condition with diabetic autonomic (poly)neuropathy: Secondary | ICD-10-CM | POA: Diagnosis not present

## 2015-03-03 DIAGNOSIS — I5022 Chronic systolic (congestive) heart failure: Secondary | ICD-10-CM

## 2015-03-03 DIAGNOSIS — N183 Chronic kidney disease, stage 3 unspecified: Secondary | ICD-10-CM

## 2015-03-03 DIAGNOSIS — M159 Polyosteoarthritis, unspecified: Secondary | ICD-10-CM

## 2015-03-03 DIAGNOSIS — E785 Hyperlipidemia, unspecified: Secondary | ICD-10-CM

## 2015-03-03 DIAGNOSIS — Z95810 Presence of automatic (implantable) cardiac defibrillator: Secondary | ICD-10-CM

## 2015-03-03 DIAGNOSIS — I10 Essential (primary) hypertension: Secondary | ICD-10-CM

## 2015-03-03 LAB — COMPREHENSIVE METABOLIC PANEL
ALK PHOS: 46 U/L (ref 39–117)
ALT: 15 U/L (ref 0–53)
AST: 22 U/L (ref 0–37)
Albumin: 4.1 g/dL (ref 3.5–5.2)
BUN: 22 mg/dL (ref 6–23)
CO2: 26 meq/L (ref 19–32)
Calcium: 9 mg/dL (ref 8.4–10.5)
Chloride: 104 mEq/L (ref 96–112)
Creatinine, Ser: 1.25 mg/dL (ref 0.40–1.50)
GFR: 60.81 mL/min (ref >60.00)
Glucose, Bld: 115 mg/dL — ABNORMAL HIGH (ref 70–99)
Potassium: 4.5 mEq/L (ref 3.5–5.1)
Sodium: 137 mEq/L (ref 135–145)
Total Bilirubin: 0.5 mg/dL (ref 0.2–1.2)
Total Protein: 6.9 g/dL (ref 6.0–8.3)

## 2015-03-03 LAB — LIPID PANEL
CHOLESTEROL: 174 mg/dL (ref 0–200)
HDL: 36.5 mg/dL — ABNORMAL LOW (ref >39.00)
LDL CALC: 105 mg/dL — AB (ref 0–99)
NONHDL: 137.76
Total CHOL/HDL Ratio: 5
Triglycerides: 164 mg/dL — ABNORMAL HIGH (ref 0.0–149.0)
VLDL: 32.8 mg/dL (ref 0.0–40.0)

## 2015-03-03 LAB — MICROALBUMIN / CREATININE URINE RATIO
CREATININE, U: 163 mg/dL
Microalb Creat Ratio: 10.4 mg/g (ref 0.0–30.0)
Microalb, Ur: 16.9 mg/dL — ABNORMAL HIGH (ref 0.0–1.9)

## 2015-03-03 LAB — HEMOGLOBIN A1C: Hgb A1c MFr Bld: 6.4 % (ref 4.6–6.5)

## 2015-03-03 MED ORDER — MELOXICAM 15 MG PO TABS: 15.0000 mg | ORAL_TABLET | Freq: Every day | ORAL | Status: DC

## 2015-03-03 MED ORDER — LISINOPRIL 2.5 MG PO TABS: 2.5000 mg | ORAL_TABLET | Freq: Every day | ORAL | Status: DC

## 2015-03-03 MED ORDER — CARVEDILOL 6.25 MG PO TABS: 6.2500 mg | ORAL_TABLET | Freq: Two times a day (BID) | ORAL | Status: DC

## 2015-03-03 NOTE — Progress Notes (Signed)
Subjective:    Patient ID: Randy [name], male    DOB: 1946-07-20, 69 y.o.   MRN: [id number]   DM- on glipizide 5 mg daily.  Checks fasting FSBS three times weekly- usually 120s-150s No recent h/o hypoglycemia. Lab Results  Component Value Date   HGBA1C 6.9* 09/27/2014  Pos urine micro in 02/2014. On lisinopril 2.5 mg daily.  Wt Readings from Last 3 Encounters:  03/03/15 195 lb 8 oz (88.678 kg)  06/22/14 197 lb (89.359 kg)  06/11/14 196 lb 6.4 oz (89.086 kg)   Gabapentin has been helping with his neuropathy.  HLD- deteriorated.  Takes Mevacor 20 mg daily along with Niaspan.  Wt Readings from Last 3 Encounters:  03/03/15 195 lb 8 oz (88.678 kg)  06/22/14 197 lb (89.359 kg)  06/11/14 196 lb 6.4 oz (89.086 kg)    Lab Results  Component Value Date   CREATININE 1.17 09/27/2014    Lab Results  Component Value Date   CHOL 167 09/27/2014   HDL 30.00* 09/27/2014   LDLCALC 101* 09/27/2014   LDLDIRECT 118.8 03/22/2014   TRIG 182.0* 09/27/2014   CHOLHDL 6 09/27/2014   OA- severe OA, multiple joints.  Seeing ortho, Dr. Lorin Mercy, for knee injections.  Hands have really been bothering him.  Tylenol does not help much.  Asking what else he can take. Lab Results  Component Value Date   PSA 1.34 06/15/2014   PSA 1.15 01/06/2013   PSA 1.45 09/27/2009    Current Outpatient Prescriptions on File Prior to Visit  Medication Sig Dispense Refill  . aspirin EC 81 MG tablet Take 81 mg by mouth daily.    Marland Kitchen gabapentin (NEURONTIN) 300 MG capsule Take 1 capsule (300 mg total) by mouth at bedtime. 90 capsule 3  . glipiZIDE (GLUCOTROL) 5 MG tablet Take 1 tablet (5 mg total) by mouth daily before breakfast. 90 tablet 1  . lisinopril (PRINIVIL,ZESTRIL) 2.5 MG tablet Take 1 tablet (2.5 mg total) by mouth daily. 30 tablet 6  . lovastatin (MEVACOR) 20 MG tablet Take 1 tablet (20 mg total) by mouth at bedtime. Need to schedule fasting labs for additional refills. 900 tablet 0  . tamsulosin (FLOMAX)  0.4 MG CAPS capsule Take 1 capsule (0.4 mg total) by mouth daily. 90 capsule 2   No current facility-administered medications on file prior to visit.    No Known Allergies  Past Medical History  Diagnosis Date  . Dyslipidemia   . Type 2 diabetes with nephropathy   . CAD (coronary artery disease)   . Systolic dysfunction   . Vitamin D deficiency   . ICD (implantable cardiac defibrillator) in place     2009-St. Jude device  . Shortness of breath   . Pacemaker   . Sleep apnea     started a sleep study but didn't completed, states PCP told him he has sleep  apnea, doesn't use CPAP  . GERD (gastroesophageal reflux disease)   . Neuromuscular disorder     neuropathy- in hands  . Arthritis     hip, hands- Osteoarthritis    . Anemia   . Hypertension     g. taylor  . CKD (chronic kidney disease) stage 3, GFR 30-59 ml/min     [name]    Past Surgical History  Procedure Laterality Date  . Left carpal tunnel release    . Left rotator cuff surgery    . Back surgery      lumbar & cervical fusion   . Insert /  replace / remove pacemaker    . Total hip arthroplasty Left 09/17/2012    Dr Lorin Mercy  . Total hip arthroplasty Left 09/17/2012    Procedure: Left TOTAL HIP ARTHROPLASTY ANTERIOR APPROACH;  Surgeon: Marybelle Killings, MD;  Location: [place];  Service: Orthopedics;  Laterality: Left;  . Lumbar laminectomy Left 07/17/2013    Procedure: MICRODISCECTOMY LUMBAR LAMINECTOMY;  Surgeon: Marybelle Killings, MD;  Location: [place];  Service: Orthopedics;  Laterality: Left;  Left L4 Laminotomy, Lateral Recess Decompression, Cyst Excision    Family History  Problem Relation Age of Onset  . Heart attack Father 78  . Diabetes Brother     History   Social History  . Marital Status: Single    Spouse Name: N/A  . Number of Children: 2  . Years of Education: N/A   Occupational History  . 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 Smoking status: Former Smoker    Types: Cigarettes     Quit date: 07/30/1984  . Smokeless tobacco: Never Used  . Alcohol Use: Yes     Comment: occ  . Drug Use: No  . Sexual Activity: Not on file   Other Topics Concern  . Not on file   Social History Narrative   Does not have a living will.   Desires CPR, does want life support if not futile.         The PMH, PSH, Social History, Family History, Medications, and allergies have been reviewed in [facility], and have been updated if relevant.      Review of Systems    Review of Systems  Constitutional: Negative.   HENT: Negative.   Eyes: Negative.   Respiratory: Negative.   Cardiovascular: Negative.   Gastrointestinal: Negative.   Endocrine: Negative.   Genitourinary: Negative.   Musculoskeletal: Positive for back pain and arthralgias.  Skin: Negative.   Allergic/Immunologic: Negative.   Neurological: Negative.   Hematological: Negative.   Psychiatric/Behavioral: Negative.   All other systems reviewed and are negative.   Objective:   Physical Exam BP 124/62 mmHg  Pulse 61  Temp(Src) 98 F (36.7 C) (Oral)  Wt 195 lb 8 oz (88.678 kg)  SpO2 98% General:  pleasant male in NAD Eyes:  PERRL Ears:  External ear exam shows no significant lesions or deformities.  Otoscopic examination reveals clear canals, tympanic membranes are intact bilaterally without bulging, retraction, inflammation or discharge. Hearing is grossly normal bilaterally. Nose:  External nasal examination shows no deformity or inflammation. Nasal mucosa are pink and moist without lesions or exudates. Mouth:  Oral mucosa and oropharynx without lesions or exudates.  Teeth in good repair. Neck:  no carotid bruit or thyromegaly no cervical or supraclavicular lymphadenopathy  Lungs:  Normal respiratory effort, chest expands symmetrically. Lungs are clear to auscultation, no crackles or wheezes. Heart:  Normal rate and regular rhythm. S1 and S2 normal without gallop, murmur, click, rub or other extra sounds. Pulses:  R  and L posterior tibial pulses are full and equal bilaterally  Ext:  +enlarged DIPs and PIP with deformities bilaterally      Assessment & Plan:

## 2015-03-03 NOTE — Patient Instructions (Signed)
Good to see you. Please start taking Tylenol ES twice daily with as needed mobic (no more than once daily) for arthritis pain. Please keep me updated.

## 2015-03-03 NOTE — Assessment & Plan Note (Signed)
Has been under good control. Recheck a1c today. Foot exam today.

## 2015-03-03 NOTE — Assessment & Plan Note (Signed)
Deteriorated. eRx sent for Mobic- advised to use with caution given h/o kidney disease.  Schedule tylenol with as needed mobic. The patient indicates understanding of these issues and agrees with the plan.

## 2015-03-03 NOTE — Assessment & Plan Note (Signed)
At goal for a diabetic. No changes made today. 

## 2015-03-03 NOTE — Progress Notes (Signed)
Pre visit review using our clinic review tool, if applicable. No additional management support is needed unless otherwise documented below in the visit note. 

## 2015-03-03 NOTE — Assessment & Plan Note (Signed)
Saying that cardiology will not refill his coreg because I refilled it last time.  Will send note to cardiologist inquiring about this and refilled coreg today.

## 2015-03-15 ENCOUNTER — Ambulatory Visit (INDEPENDENT_AMBULATORY_CARE_PROVIDER_SITE_OTHER): Payer: Medicare Other | Admitting: *Deleted

## 2015-03-15 DIAGNOSIS — Z95810 Presence of automatic (implantable) cardiac defibrillator: Secondary | ICD-10-CM

## 2015-03-15 NOTE — Progress Notes (Signed)
Remote ICD transmission.   

## 2015-03-20 IMAGING — CT CT L SPINE W/ CM
4 of 11 series · 11 of 33 positions shown, 13 images · non-contrast
Comparison: Lumbar myelogram 11/20/2007

CLINICAL DATA: Low back pain extending into the left lower
extremity. Previous lumbar fusion. The pain extends into his left
lateral thigh and posterior calf.

EXAM:
LUMBAR MYELOGRAM
TECHNIQUE: Contiguous axial images were obtained through the Lumbar spine after
the intrathecal infusion of infusion. Coronal and sagittal
reconstructions were obtained of the axial image sets.

[Series 2: l spine bone · axial · 0.27mm/px · z∈[-353,-88]mm · 3 of 107 slices shown, 4 images]
[im 1/107  soft-tissue]
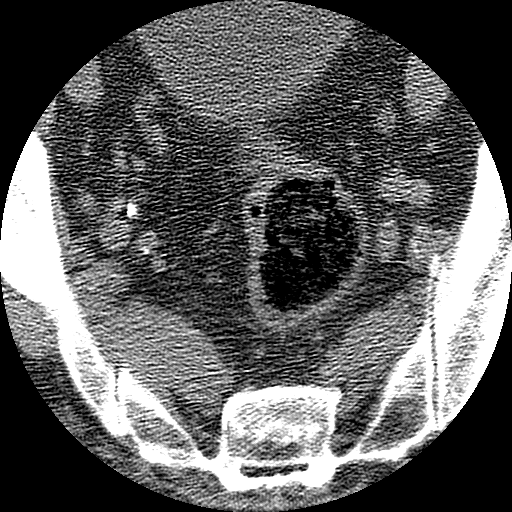
[im 1/107  bone]
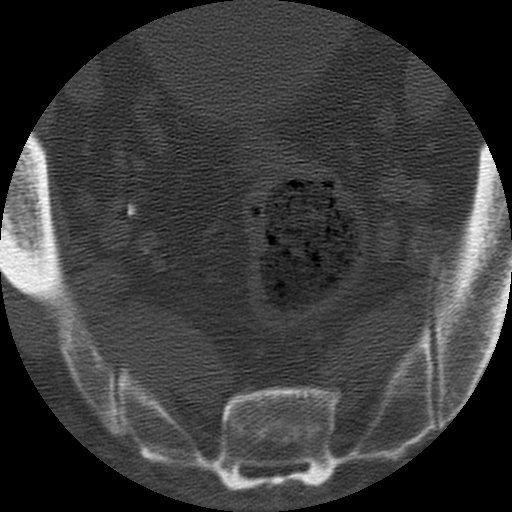
[im 54/107  bone]
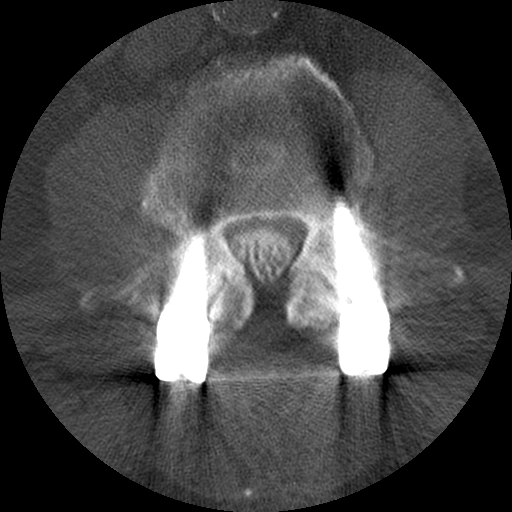
[im 107/107  bone]
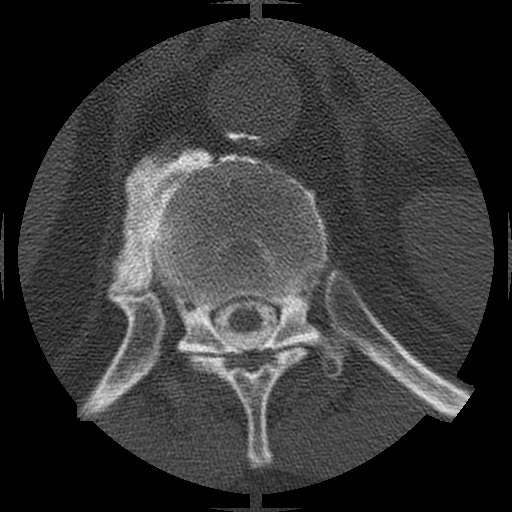

[Series 3: l spine soft · axial · 0.27mm/px · z∈[-266,-178]mm · 2 of 107 slices shown]
[im 36/107  soft-tissue]
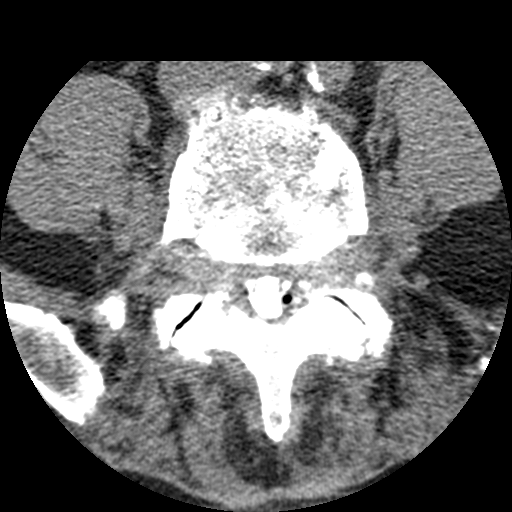
[im 71/107  soft-tissue]
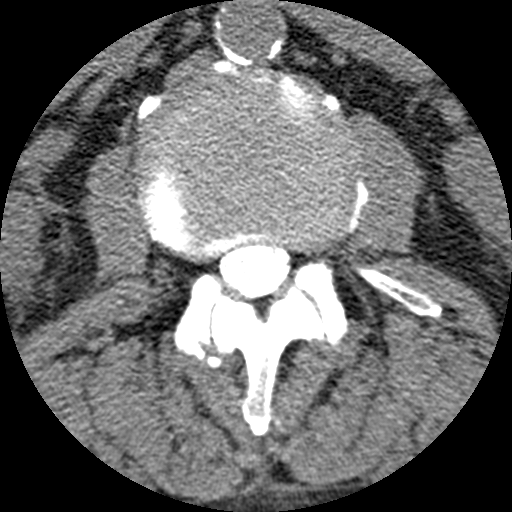

[Series 400: cor upper · coronal · 0.53mm/px · 1 of 70 slices shown]
[im 35/70  bone]
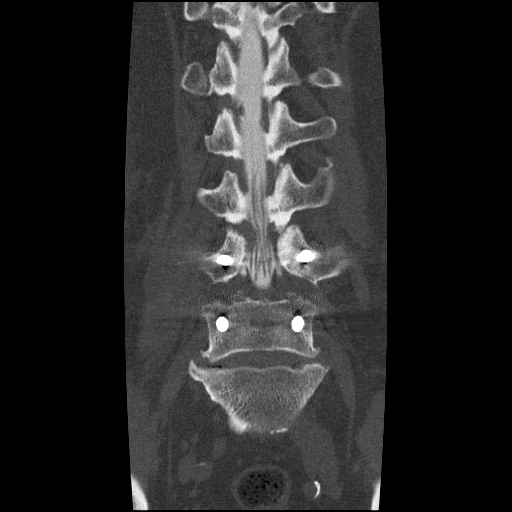

[Series 402: sag · coronal · 0.53mm/px · 5 of 66 slices shown, 6 images]
[im 22/66  bone]
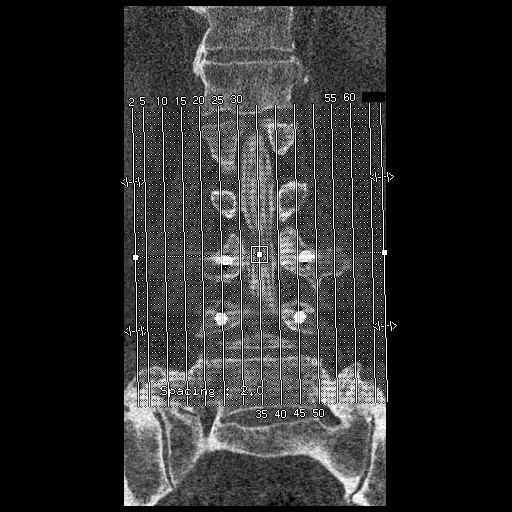
[im 28/66  bone]
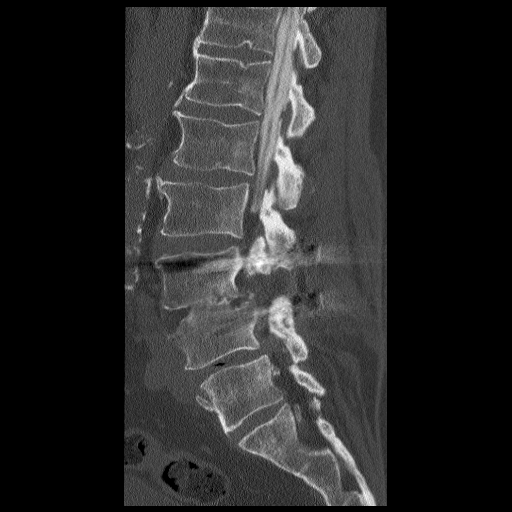
[im 33/66  soft-tissue]
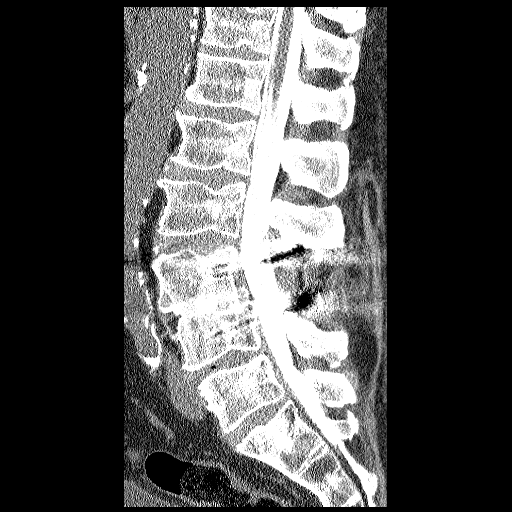
[im 33/66  bone]
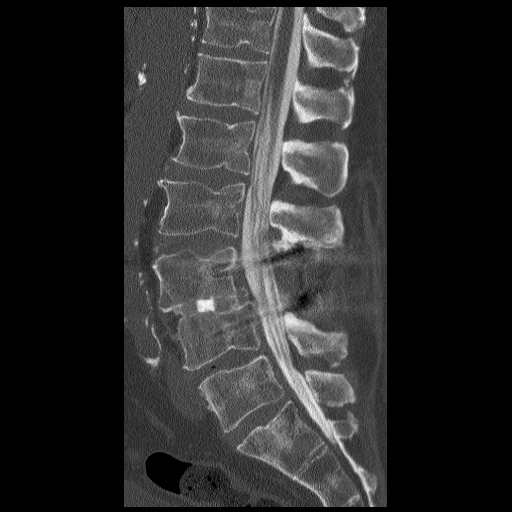
[im 38/66  bone]
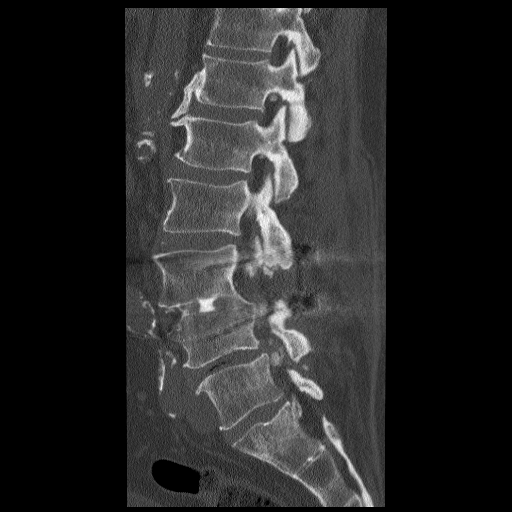
[im 44/66  bone]
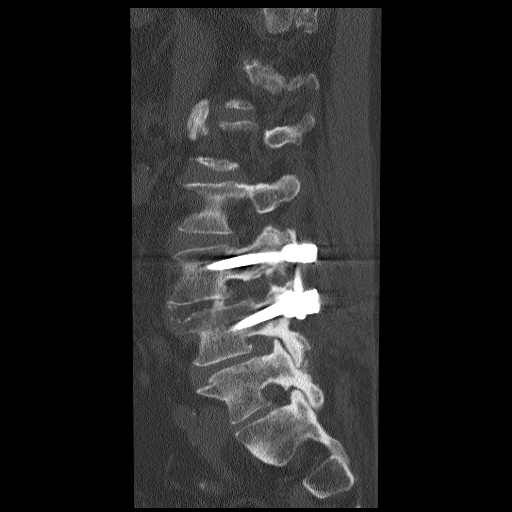

[11 of 33 positions shown; findings below may reference images not displayed]

FLUOROSCOPY TIME:  35 seconds

PROCEDURE:
After thorough discussion of risks and benefits of the procedure
including bleeding, infection, injury to nerves, blood vessels,
adjacent structures as well as headache and CSF leak, written and
oral informed consent was obtained. Consent was obtained by Dr.
John Noel Morota. Time out form was completed.

Patient was positioned prone on the fluoroscopy table. Local
anesthesia was provided with 1% lidocaine without epinephrine after
prepped and draped in the usual sterile fashion. Puncture was
performed at L1-2 using a 3 1/2 inch 22-gauge spinal needle via left
paramedian approach. Using a single pass through the dura, the
needle was placed within the thecal sac, with return of clear CSF.
15 mL of Tmnipaque-83F was injected into the thecal sac, with normal
opacification of the nerve roots and cauda equina consistent with
free flow within the subarachnoid space.

I personally performed the lumbar puncture and administered the
intrathecal contrast. I also personally supervised acquisition of
the myelogram images.
FINDINGS: LUMBAR MYELOGRAM FINDINGS:

Five non rib-bearing lumbar type vertebral bodies are present. A
transitional L5 segment is noted. The patient is fused posteriorly
at L3-4, as before. Slight lateral recess narrowing on the right at
L2-3 demonstrates mild progression since the prior study.

Moderate central canal stenosis at L4-5 is new. There is slight
anterolisthesis at L4-5 as well. The fusion at L3-4 is stable.
Moderate lateral recess narrowing is present bilaterally.

The anterolisthesis at L4-5 is exaggerated upon standing and with
flexion. There is slight reduction during extension.

CT LUMBAR MYELOGRAM FINDINGS:

The cervical spine is imaged from the midbody of T11 to S2-3. The
fusion at L3-4 is stable. Slight anterolisthesis is now present at
L4-5. Conus medullaris terminates at L1, stable position.
Atherosclerotic calcifications are noted in the aorta without
aneurysm.

The disc levels at T12-L1 and above are normal.

L1-2: Mild broad-based disc bulging is present. Mild facet
hypertrophy is noted bilaterally. There is no significant stenosis
or change.

L2-3: Moderate facet hypertrophy is present. Ligamentum flavum
thickening is noted. These factors result in mild lateral recess
narrowing bilaterally. Mild foraminal narrowing is present on both
sides.

L3-4: The patient is fused anteriorly. The patient is status post
bilateral laminectomies and foraminotomies. Anterolisthesis is
stable. There is no residual or recurrent stenosis.

L4-5: A new intermediate density lesion emanates from the left L4-5
facet and likely represents a hemorrhagic synovial cyst. This
results in severe left lateral recess narrowing. A broad-based disc
herniation contributes. Moderate foraminal stenosis bilaterally has
progressed since prior exam. Mild to moderate right lateral recess
narrowing is worse as well.

L5-S1: This is a transitional segment as before. A rudimentary disc
is present. There is no significant herniation or stenosis. Mild
facet hypertrophy is noted. The transverse processes fused to the
sacral ala.
IMPRESSION: LUMBAR MYELOGRAM IMPRESSION:

1. New dynamic anterolisthesis at L4-5 with associated mild to
moderate lateral recess narrowing bilaterally.
2. Stable fusion at L3-4 without significant residual stenosis.
3. Slight progression of minimal right lateral recess narrowing at
L2-3 since the prior study.

CT LUMBAR MYELOGRAM IMPRESSION:

1. Mild disc bulging and facet hypertrophy at L1-2 without
significant stenosis.
2. Mild lateral recess and foraminal narrowing on both sides at
L2-3.
3. Stable fusion at L3-4 without evidence for residual or recurrent
stenosis.
4. New intermediate density lesion extending into the left lateral
recess at L4-5 measures 11 x 9 mm and is most compatible with a
hemorrhagic synovial cyst.
5. Severe left lateral recess narrowing at L4-5 as a result.
6. Moderate foraminal narrowing bilaterally at L4-5.
7. Transitional segment at L5-S1 with some progression of facet
hypertrophy but no significant stenosis.

## 2015-03-22 LAB — CUP PACEART REMOTE DEVICE CHECK
HighPow Impedance: 63 Ohm
HighPow Impedance: 63 Ohm
Lead Channel Impedance Value: 430 Ohm
Lead Channel Pacing Threshold Amplitude: 1.5 V
Lead Channel Pacing Threshold Pulse Width: 0.8 ms
Lead Channel Setting Pacing Pulse Width: 0.8 ms
MDC IDC MSMT BATTERY REMAINING LONGEVITY: 41 mo
MDC IDC MSMT BATTERY VOLTAGE: 2.6 V
MDC IDC MSMT LEADCHNL RV SENSING INTR AMPL: 6.2 mV
MDC IDC SESS DTM: 20160816061743
MDC IDC SET LEADCHNL RV PACING AMPLITUDE: 2.5 V
MDC IDC SET LEADCHNL RV SENSING SENSITIVITY: 0.3 mV
MDC IDC STAT BRADY RV PERCENT PACED: 1 %
Pulse Gen Serial Number: 663191
Zone Setting Detection Interval: 250 ms
Zone Setting Detection Interval: 300 ms
Zone Setting Detection Interval: 340 ms

## 2015-03-30 ENCOUNTER — Encounter: Payer: Self-pay | Admitting: Cardiology

## 2015-03-31 ENCOUNTER — Other Ambulatory Visit: Payer: Self-pay | Admitting: Family Medicine

## 2015-04-05 ENCOUNTER — Encounter: Payer: Self-pay | Admitting: Internal Medicine

## 2015-04-18 ENCOUNTER — Other Ambulatory Visit: Payer: Self-pay | Admitting: Family Medicine

## 2015-04-19 ENCOUNTER — Encounter: Payer: Self-pay | Admitting: Gastroenterology

## 2015-06-13 ENCOUNTER — Other Ambulatory Visit: Payer: Self-pay | Admitting: Family Medicine

## 2015-06-14 ENCOUNTER — Ambulatory Visit (INDEPENDENT_AMBULATORY_CARE_PROVIDER_SITE_OTHER): Payer: Medicare Other | Admitting: Internal Medicine

## 2015-06-14 ENCOUNTER — Encounter: Payer: Self-pay | Admitting: Internal Medicine

## 2015-06-14 VITALS — BP 86/40 | HR 57 | Ht 69.0 in | Wt 196.6 lb

## 2015-06-14 DIAGNOSIS — I10 Essential (primary) hypertension: Secondary | ICD-10-CM | POA: Diagnosis not present

## 2015-06-14 DIAGNOSIS — I5022 Chronic systolic (congestive) heart failure: Secondary | ICD-10-CM

## 2015-06-14 DIAGNOSIS — Z95810 Presence of automatic (implantable) cardiac defibrillator: Secondary | ICD-10-CM

## 2015-06-14 DIAGNOSIS — I214 Non-ST elevation (NSTEMI) myocardial infarction: Secondary | ICD-10-CM

## 2015-06-14 LAB — CUP PACEART INCLINIC DEVICE CHECK
Brady Statistic RV Percent Paced: 0.03 %
Date Time Interrogation Session: 20161115135340
HIGH POWER IMPEDANCE MEASURED VALUE: 72 Ohm
Implantable Lead Implant Date: 20091022
Implantable Lead Model: 7121
Lead Channel Pacing Threshold Amplitude: 1.25 V
Lead Channel Pacing Threshold Pulse Width: 0.8 ms
Lead Channel Sensing Intrinsic Amplitude: 5.8 mV
Lead Channel Setting Pacing Pulse Width: 0.8 ms
MDC IDC LEAD LOCATION: 753860
MDC IDC MSMT BATTERY REMAINING LONGEVITY: 42
MDC IDC MSMT BATTERY VOLTAGE: 2.6 V
MDC IDC MSMT LEADCHNL RV IMPEDANCE VALUE: 425 Ohm
MDC IDC SET LEADCHNL RV PACING AMPLITUDE: 2.5 V
MDC IDC SET LEADCHNL RV SENSING SENSITIVITY: 0.3 mV
Pulse Gen Serial Number: 663191

## 2015-06-14 NOTE — Progress Notes (Signed)
HPI Randy [name] returns today for followup. He is a pleasant 69 yo man with a h/o HTN, chronic systolic CHF, s/p ICD implant. In the interim he has been stable. He has chronic exertional dyspnea which is class II. He denies chest pain or syncope. No ICD shocks.  No Known Allergies   Current Outpatient Prescriptions  Medication Sig Dispense Refill  . aspirin EC 81 MG tablet Take 81 mg by mouth daily.    . carvedilol (COREG) 6.25 MG tablet Take 1 tablet (6.25 mg total) by mouth 2 (two) times daily with a meal. 60 tablet 2  . gabapentin (NEURONTIN) 300 MG capsule Take 1 capsule (300 mg total) by mouth at bedtime. 90 capsule 3  . glipiZIDE (GLUCOTROL) 5 MG tablet TAKE ONE TABLET BY MOUTH DAILY BEFORE BREAKFAST 90 tablet 0  . lisinopril (PRINIVIL,ZESTRIL) 2.5 MG tablet Take 1 tablet (2.5 mg total) by mouth daily. 30 tablet 6  . lovastatin (MEVACOR) 20 MG tablet Take 1 tablet (20 mg total) by mouth at bedtime. 90 tablet 1  . meloxicam (MOBIC) 15 MG tablet TAKE ONE TABLET BY MOUTH ONCE DAILY 30 tablet 5  . tamsulosin (FLOMAX) 0.4 MG CAPS capsule Take 1 capsule (0.4 mg total) by mouth daily. 90 capsule 2   No current facility-administered medications for this visit.     Past Medical History  Diagnosis Date  . Dyslipidemia   . Type 2 diabetes with nephropathy ([place])   . CAD (coronary artery disease)   . Systolic dysfunction   . Vitamin D deficiency   . ICD (implantable cardiac defibrillator) in place     2009-St. Jude device  . Shortness of breath   . Pacemaker   . Sleep apnea     started a sleep study but didn't completed, states PCP told him he has sleep  apnea, doesn't use CPAP  . GERD (gastroesophageal reflux disease)   . 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 Arthritis     hip, hands- Osteoarthritis    . Anemia   . Hypertension     g. Alyx Mcguirk  . CKD (chronic kidney disease) stage 3, GFR 30-59 ml/min     [name]    ROS:   All systems reviewed and  negative except as noted in the HPI.   Past Surgical History  Procedure Laterality Date  . Left carpal tunnel release    . Left rotator cuff surgery    . Back surgery      lumbar & cervical fusion   . Insert / replace / remove pacemaker    . Total hip arthroplasty Left 09/17/2012    Dr Lorin Mercy  . Total hip arthroplasty Left 09/17/2012    Procedure: Left TOTAL HIP ARTHROPLASTY ANTERIOR APPROACH;  Surgeon: Marybelle Killings, MD;  Location: [place];  Service: Orthopedics;  Laterality: Left;  . Lumbar laminectomy Left 07/17/2013    Procedure: MICRODISCECTOMY LUMBAR LAMINECTOMY;  Surgeon: Marybelle Killings, MD;  Location: [place];  Service: Orthopedics;  Laterality: Left;  Left L4 Laminotomy, Lateral Recess Decompression, Cyst Excision     Family History  Problem Relation Age of Onset  . Heart attack Father 15  . Diabetes Brother      Social History   Social History  . Marital Status: Single    Spouse Name: N/A  . Number of Children: 2  . Years of Education: N/A   Occupational History  . retired  floor covering   Social History Main Topics  . Smoking status: Former Smoker    Types: Cigarettes    Quit date: 07/30/1984  . Smokeless tobacco: Never Used  . Alcohol Use: Yes     Comment: occ  . Drug Use: No  . Sexual Activity: Not on file   Other Topics Concern  . Not on file   Social History Narrative   Does not have a living will.   Desires CPR, does want life support if not futile.           BP 86/40 mmHg  Pulse 57  Ht 5\' 9"  (1.753 m)  Wt 196 lb 9.6 oz (89.177 kg)  BMI 29.02 kg/m2  SpO2 98% BP - 96/60 by me Physical Exam:  Well appearing 69 year old man,NAD HEENT: Unremarkable Neck:  6 cm JVD, no thyromegally Back:  No CVA tenderness Lungs:  Clear with no wheezes, rales, or rhonchi. HEART:  Regular rate rhythm, no murmurs, no rubs, no clicks Abd:  soft, positive bowel sounds, no organomegally, no rebound, no guarding Ext:  2 plus pulses, no edema, no cyanosis, no  clubbing Skin:  No rashes no nodules Neuro:  CN II through XII intact, motor grossly intact  DEVICE  Normal device function.  See PaceArt for details.   Assess/Plan:

## 2015-06-14 NOTE — Assessment & Plan Note (Signed)
His device is working normally. Will follow. 

## 2015-06-14 NOTE — Assessment & Plan Note (Signed)
His blood pressure is well controlled. He feels well. He is encouraged to maintain a low sodium diet.

## 2015-06-14 NOTE — Patient Instructions (Signed)
Medication Instructions:  Your physician recommends that you continue on your current medications as directed. Please refer to the Current Medication list given to you today.  Labwork: None ordered  Testing/Procedures: None ordered  Follow-Up: Remote monitoring is used to monitor your Pacemaker of ICD from home. This monitoring reduces the number of office visits required to check your device to one time per year. It allows [place] to keep an eye on the functioning of your device to ensure it is working properly. You are scheduled for a device check from home on 09/13/2015. You may send your transmission at any time that day. If you have a wireless device, the transmission will be sent automatically. After your physician reviews your transmission, you will receive a postcard with your next transmission date.  Your physician wants you to follow-up in: 1 year with Dr. Lovena Le. You will receive a reminder letter in the mail two months in advance. If you don't receive a letter, please call our office to schedule the follow-up appointment.   If you need a refill on your cardiac medications before your next appointment, please call your pharmacy.  Thank you for choosing [facility]!!

## 2015-06-14 NOTE — Assessment & Plan Note (Signed)
His symptoms are class 2. He will continue his current meds. He is encouraged to reduce his salt intake. Will follow.

## 2015-06-17 ENCOUNTER — Other Ambulatory Visit: Payer: Self-pay | Admitting: Family Medicine

## 2015-06-17 NOTE — Telephone Encounter (Signed)
It seems that Dr. Deborra Medina has been filling this for the pt though. Medication sent electronically.

## 2015-06-17 NOTE — Telephone Encounter (Signed)
i recently denied medication as pt has cardiologist. Received another request on today

## 2015-06-21 ENCOUNTER — Other Ambulatory Visit: Payer: Self-pay | Admitting: Family Medicine

## 2015-07-07 ENCOUNTER — Ambulatory Visit (INDEPENDENT_AMBULATORY_CARE_PROVIDER_SITE_OTHER): Payer: Medicare Other | Admitting: Family Medicine

## 2015-07-07 ENCOUNTER — Encounter: Payer: Self-pay | Admitting: Family Medicine

## 2015-07-07 VITALS — BP 116/60 | HR 59 | Temp 98.0°F | Wt 197.2 lb

## 2015-07-07 DIAGNOSIS — E1121 Type 2 diabetes mellitus with diabetic nephropathy: Secondary | ICD-10-CM

## 2015-07-07 DIAGNOSIS — M159 Polyosteoarthritis, unspecified: Secondary | ICD-10-CM

## 2015-07-07 DIAGNOSIS — Z23 Encounter for immunization: Secondary | ICD-10-CM | POA: Diagnosis not present

## 2015-07-07 DIAGNOSIS — J069 Acute upper respiratory infection, unspecified: Secondary | ICD-10-CM

## 2015-07-07 MED ORDER — GABAPENTIN 300 MG PO CAPS: ORAL_CAPSULE | ORAL | Status: DC

## 2015-07-07 MED ORDER — TRAMADOL HCL 50 MG PO TABS: 50.0000 mg | ORAL_TABLET | Freq: Three times a day (TID) | ORAL | Status: DC | PRN

## 2015-07-07 NOTE — Patient Instructions (Addendum)
Great to see you. Please take Mucinex OTC  For your hands, we are increasing Gabapentin ( neurontin) to 300 mg with breakast, 300 mg at lunch and 600 mg at bedtime. Tramadol as for severe pain.

## 2015-07-07 NOTE — Assessment & Plan Note (Signed)
Deteriorated. Continue current rx, Tramadol rx given for severe pain.

## 2015-07-07 NOTE — Progress Notes (Signed)
Pre visit review using our clinic review tool, if applicable. No additional management support is needed unless otherwise documented below in the visit note. 

## 2015-07-07 NOTE — Progress Notes (Signed)
Subjective:   Patient ID: Randy [name], male    DOB: 10-Jun-1946, 69 y.o.   MRN: [id number]  Randy [name] is a pleasant 69 y.o. year old male who presents to clinic today with Arthritis and Numbness  on 07/07/2015  HPI:  H/o severe arthritis in his hands along with known diabetic neuropathy in his feet.   Neurontin 300 mg qhs has helped tremendously with the neuropathy in his feet but for past few weeks, now has pain, numbness and tingling in his fingers as well.  Current dose of neurontin does not seem to be helping with this.  Also has had a cough for past two days. No CP or SOB.  No fevers or chills.  Has not taken anything for it.  Current Outpatient Prescriptions on File Prior to Visit  Medication Sig Dispense Refill  . aspirin EC 81 MG tablet Take 81 mg by mouth daily.    . carvedilol (COREG) 6.25 MG tablet TAKE ONE TABLET BY MOUTH TWICE DAILY WITH  A  MEAL 60 tablet 2  . glipiZIDE (GLUCOTROL) 5 MG tablet TAKE ONE TABLET BY MOUTH DAILY BEFORE BREAKFAST 90 tablet 0  . lisinopril (PRINIVIL,ZESTRIL) 2.5 MG tablet Take 1 tablet (2.5 mg total) by mouth daily. 30 tablet 6  . lovastatin (MEVACOR) 20 MG tablet Take 1 tablet (20 mg total) by mouth at bedtime. 90 tablet 1  . meloxicam (MOBIC) 15 MG tablet TAKE ONE TABLET BY MOUTH ONCE DAILY 30 tablet 5  . tamsulosin (FLOMAX) 0.4 MG CAPS capsule Take 1 capsule (0.4 mg total) by mouth daily. 90 capsule 2   No current facility-administered medications on file prior to visit.    No Known Allergies  Past Medical History  Diagnosis Date  . Dyslipidemia   . Type 2 diabetes with nephropathy ([place])   . CAD (coronary artery disease)   . Systolic dysfunction   . Vitamin D deficiency   . ICD (implantable cardiac defibrillator) in place     2009-St. Jude device  . Shortness of breath   . Pacemaker   . Sleep apnea     started a sleep study but didn't completed, states PCP told him he has sleep  apnea, doesn't use CPAP  . GERD  (gastroesophageal reflux disease)   . 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 Arthritis     hip, hands- Osteoarthritis    . Anemia   . Hypertension     g. taylor  . CKD (chronic kidney disease) stage 3, GFR 30-59 ml/min     [name]    Past Surgical History  Procedure Laterality Date  . Left carpal tunnel release    . Left rotator cuff surgery    . Back surgery      lumbar & cervical fusion   . Insert / replace / remove pacemaker    . Total hip arthroplasty Left 09/17/2012    Dr Lorin Mercy  . Total hip arthroplasty Left 09/17/2012    Procedure: Left TOTAL HIP ARTHROPLASTY ANTERIOR APPROACH;  Surgeon: Marybelle Killings, MD;  Location: [place];  Service: Orthopedics;  Laterality: Left;  . Lumbar laminectomy Left 07/17/2013    Procedure: MICRODISCECTOMY LUMBAR LAMINECTOMY;  Surgeon: Marybelle Killings, MD;  Location: [place];  Service: Orthopedics;  Laterality: Left;  Left L4 Laminotomy, Lateral Recess Decompression, Cyst Excision    Family History  Problem Relation Age of Onset  . Heart attack Father 94  . Diabetes Brother  Social History   Social History  . Marital Status: Single    Spouse Name: N/A  . Number of Children: 2  . Years of Education: N/A   Occupational History  . 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 Smoking status: Former Smoker    Types: Cigarettes    Quit date: 07/30/1984  . Smokeless tobacco: Never Used  . Alcohol Use: Yes     Comment: occ  . Drug Use: No  . Sexual Activity: Not on file   Other Topics Concern  . Not on file   Social History Narrative   Does not have a living will.   Desires CPR, does want life support if not futile.         The PMH, PSH, Social History, Family History, Medications, and allergies have been reviewed in [facility], and have been updated if relevant.  Review of Systems  Constitutional: Negative.   HENT: Positive for congestion.   Respiratory: Positive for cough. Negative for shortness of  breath, wheezing and stridor.   Cardiovascular: Negative.   Endocrine: Negative.   Musculoskeletal: Positive for arthralgias.  Skin: Negative.   Neurological: Positive for numbness.  Psychiatric/Behavioral: Negative.   All other systems reviewed and are negative.      Objective:    BP 116/60 mmHg  Pulse 59  Temp(Src) 98 F (36.7 C) (Oral)  Wt 197 lb 4 oz (89.472 kg)  SpO2 95%   Physical Exam   General: pleasant male in NAD Eyes: PERRL Ears: External ear exam shows no significant lesions or deformities. Otoscopic examination reveals clear canals, tympanic membranes are intact bilaterally without bulging, retraction, inflammation or discharge. Hearing is grossly normal bilaterally. Nose: External nasal examination shows no deformity or inflammation. Nasal mucosa are pink and moist without lesions or exudates. Mouth: Oral mucosa and oropharynx without lesions or exudates. Teeth in good repair. Neck: no carotid bruit or thyromegaly no cervical or supraclavicular lymphadenopathy  Lungs: Normal respiratory effort, chest expands symmetrically. Lungs are clear to auscultation, no crackles or wheezes. Heart: Normal rate and regular rhythm. S1 and S2 normal without gallop, murmur, click, rub or other extra sounds. Pulses: R and L posterior tibial pulses are full and equal bilaterally  Ext: +enlarged DIPs and PIP with deformities bilaterally     Assessment & Plan:   Need for influenza vaccination - Plan: Flu Vaccine QUAD 36+ mos PF IM (Fluarix & Fluzone Quad PF) No Follow-up on file.

## 2015-07-07 NOTE — Assessment & Plan Note (Signed)
Deteriorated- increase dose of gabapentin- see AVS for instructions. Call or return to clinic prn if these symptoms worsen or fail to improve as anticipated. The patient indicates understanding of these issues and agrees with the plan.

## 2015-07-07 NOTE — Assessment & Plan Note (Signed)
New- likely viral Advised supportive care- fluids, mucinex. Call or return to clinic prn if these symptoms worsen or fail to improve as anticipated. The patient indicates understanding of these issues and agrees with the plan.

## 2015-08-01 ENCOUNTER — Other Ambulatory Visit: Payer: Self-pay | Admitting: Family Medicine

## 2015-08-02 ENCOUNTER — Other Ambulatory Visit: Payer: Self-pay | Admitting: Family Medicine

## 2015-08-02 MED ORDER — TAMSULOSIN HCL 0.4 MG PO CAPS: 0.4000 mg | ORAL_CAPSULE | Freq: Every day | ORAL | Status: DC

## 2015-08-16 ENCOUNTER — Encounter: Payer: Self-pay | Admitting: Internal Medicine

## 2015-08-16 ENCOUNTER — Ambulatory Visit (INDEPENDENT_AMBULATORY_CARE_PROVIDER_SITE_OTHER): Payer: Medicare Other | Admitting: Internal Medicine

## 2015-08-16 VITALS — BP 132/68 | HR 82 | Temp 98.7°F | Wt 194.5 lb

## 2015-08-16 DIAGNOSIS — J209 Acute bronchitis, unspecified: Secondary | ICD-10-CM

## 2015-08-16 MED ORDER — HYDROCODONE-HOMATROPINE 5-1.5 MG/5ML PO SYRP: 5.0000 mL | ORAL_SOLUTION | Freq: Three times a day (TID) | ORAL | Status: DC | PRN

## 2015-08-16 MED ORDER — AZITHROMYCIN 250 MG PO TABS: ORAL_TABLET | ORAL | Status: DC

## 2015-08-16 NOTE — Progress Notes (Signed)
HPI  Pt presents to the clinic today with c/o runny nose, cough, chest congestion and shortness of breath. This started about 1 month ago. His symptoms did seem to get better but got significantly worse 2-3 days ago. The cough is non productive. He has run low grade fever and had chills. He has tried Mucinex with minimal relief. He does have DM 2. Last A1C was 6.4%. He is UTD with flu and pneumonia vaccines. He did have similar symptoms 112/8- was diagnosed with a viral URI and treated with supportive measures. He is a former smoker.  Review of Systems      Past Medical History  Diagnosis Date  . Dyslipidemia   . Type 2 diabetes with nephropathy ([place])   . CAD (coronary artery disease)   . Systolic dysfunction   . Vitamin D deficiency   . ICD (implantable cardiac defibrillator) in place     2009-St. Jude device  . Shortness of breath   . Pacemaker   . Sleep apnea     started a sleep study but didn't completed, states PCP told him he has sleep  apnea, doesn't use CPAP  . GERD (gastroesophageal reflux disease)   . 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 Arthritis     hip, hands- Osteoarthritis    . Anemia   . Hypertension     g. taylor  . CKD (chronic kidney disease) stage 3, GFR 30-59 ml/min     [name]    Family History  Problem Relation Age of Onset  . Heart attack Father 51  . Diabetes Brother     Social History   Social History  . Marital Status: Single    Spouse Name: N/A  . Number of Children: 2  . Years of Education: N/A   Occupational History  . 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 Smoking status: Former Smoker    Types: Cigarettes    Quit date: 07/30/1984  . Smokeless tobacco: Never Used  . Alcohol Use: Yes     Comment: occ  . Drug Use: No  . Sexual Activity: Not on file   Other Topics Concern  . Not on file   Social History Narrative   Does not have a living will.   Desires CPR, does want life support if  not futile.          No Known Allergies   Constitutional: Positive fever. Denies headache, fatigue, abrupt weight changes.  HEENT:  Positive runny nose. Denies eye redness, eye pain, pressure behind the eyes, facial pain, nasal congestion, ear pain, ringing in the ears, wax buildup, or sore  throat. Respiratory: Positive cough and shortness of breath. Denies difficulty breathing or shortness of breath.  Cardiovascular: Denies chest pain, chest tightness, palpitations or swelling in the hands or feet.   No other specific complaints in a complete review of systems (except as listed in HPI above).  Objective:   BP 132/68 mmHg  Pulse 82  Temp(Src) 98.7 F (37.1 C) (Oral)  Wt 194 lb 8 oz (88.225 kg)  SpO2 94% Wt Readings from Last 3 Encounters:  08/16/15 194 lb 8 oz (88.225 kg)  07/07/15 197 lb 4 oz (89.472 kg)  06/14/15 196 lb 9.6 oz (89.177 kg)     General: Appears his stated age, ill appearng in NAD. HEENT: Head: normal shape and size, no sinus tenderness noted; Eyes: sclera white, no icterus, conjunctiva pink;  Ears: Tm's pink but  intact, normal light reflex, +serous effusion bilaterally; Nose: mucosa pink and moist, septum midline; Throat/Mouth: + PND. Teeth present, mucosa pink and moist, no exudate noted, no lesions or ulcerations noted.  Neck: No ervical lymphadenopathy.  Cardiovascular: Normal rate and rhythm. S1,S2 noted.  No murmur, rubs or gallops noted.  Pulmonary/Chest: Normal effort with scattered rhonchi and intermittent expiratory wheeze. No respiratory distress.      Assessment & Plan:   Acute Bronchitis:  Get some rest and drink plenty of water He declines RX for Albuterol inhaler- reports he can not afford it eRx for Azithromax x 5 days Rx for Hycodan cough syrup  RTC as needed or if symptoms persist.

## 2015-08-16 NOTE — Patient Instructions (Signed)

## 2015-08-16 NOTE — Progress Notes (Signed)
Pre visit review using our clinic review tool, if applicable. No additional management support is needed unless otherwise documented below in the visit note. 

## 2015-08-17 ENCOUNTER — Other Ambulatory Visit: Payer: Self-pay | Admitting: Internal Medicine

## 2015-08-17 ENCOUNTER — Telehealth: Payer: Self-pay

## 2015-08-17 MED ORDER — ALBUTEROL SULFATE HFA 108 (90 BASE) MCG/ACT IN AERS: 2.0000 | INHALATION_SPRAY | Freq: Four times a day (QID) | RESPIRATORY_TRACT | Status: DC | PRN

## 2015-08-17 NOTE — Telephone Encounter (Signed)
Pt left v/m; pt was seen 08/16/15 and declined albuterol inhaler; pt said breathing is worse and pt request inhaler sent to [place] this morning. Pt request cb when called in.

## 2015-08-17 NOTE — Telephone Encounter (Signed)
It has been sent electronically

## 2015-08-19 ENCOUNTER — Encounter: Payer: Self-pay | Admitting: Internal Medicine

## 2015-08-19 ENCOUNTER — Ambulatory Visit (INDEPENDENT_AMBULATORY_CARE_PROVIDER_SITE_OTHER)
Admission: RE | Admit: 2015-08-19 | Discharge: 2015-08-19 | Disposition: A | Discharge status: Home / Self Care | Payer: Medicare Other | Source: Ambulatory Visit | Attending: Internal Medicine | Admitting: Internal Medicine

## 2015-08-19 ENCOUNTER — Ambulatory Visit (INDEPENDENT_AMBULATORY_CARE_PROVIDER_SITE_OTHER): Payer: Medicare Other | Admitting: Internal Medicine

## 2015-08-19 VITALS — BP 114/62 | HR 64 | Temp 97.9°F | Wt 191.0 lb

## 2015-08-19 DIAGNOSIS — R0602 Shortness of breath: Secondary | ICD-10-CM

## 2015-08-19 DIAGNOSIS — E876 Hypokalemia: Secondary | ICD-10-CM | POA: Diagnosis not present

## 2015-08-19 DIAGNOSIS — R05 Cough: Secondary | ICD-10-CM

## 2015-08-19 DIAGNOSIS — R059 Cough, unspecified: Secondary | ICD-10-CM

## 2015-08-19 LAB — COMPREHENSIVE METABOLIC PANEL
ALT: 17 U/L (ref 0–53)
AST: 22 U/L (ref 0–37)
Albumin: 4.2 g/dL (ref 3.5–5.2)
Alkaline Phosphatase: 45 U/L (ref 39–117)
BILIRUBIN TOTAL: 0.4 mg/dL (ref 0.2–1.2)
BUN: 42 mg/dL — ABNORMAL HIGH (ref 6–23)
CHLORIDE: 101 meq/L (ref 96–112)
CO2: 29 meq/L (ref 19–32)
Calcium: 9.4 mg/dL (ref 8.4–10.5)
Creatinine, Ser: 1.55 mg/dL — ABNORMAL HIGH (ref 0.40–1.50)
GFR: 47.38 mL/min — AB (ref >60.00)
GLUCOSE: 119 mg/dL — AB (ref 70–99)
POTASSIUM: 5.2 meq/L — AB (ref 3.5–5.1)
Sodium: 137 mEq/L (ref 135–145)
Total Protein: 7.5 g/dL (ref 6.0–8.3)

## 2015-08-19 LAB — CBC
HEMATOCRIT: 39.5 % (ref 39.0–52.0)
Hemoglobin: 13 g/dL (ref 13.0–17.0)
MCHC: 32.8 g/dL (ref 30.0–36.0)
MCV: 87.5 fl (ref 78.0–100.0)
Platelets: 182 10*3/uL (ref 150.0–400.0)
RBC: 4.52 Mil/uL (ref 4.22–5.81)
RDW: 13.1 % (ref 11.5–15.5)
WBC: 6.2 10*3/uL (ref 4.0–10.5)

## 2015-08-19 LAB — BRAIN NATRIURETIC PEPTIDE: PRO B NATRI PEPTIDE: 44 pg/mL (ref 0.0–100.0)

## 2015-08-19 NOTE — Progress Notes (Signed)
Pre visit review using our clinic review tool, if applicable. No additional management support is needed unless otherwise documented below in the visit note. 

## 2015-08-19 NOTE — Patient Instructions (Signed)

## 2015-08-19 NOTE — Progress Notes (Signed)
HPI  Pt presents to the clinic today for recheck. Randy [name] was seen 08/16/2015, diagnosed with acute bronchitis. Randy [name] was treated with a Zpack, Hycodan and Albuterol.  Randy [name] continues to c/o runny nose, cough, chest congestion and shortness of breath. Randy [name] has noticed mild improvement. The cough is non productive. Randy [name] has run low grade fever and had chills. Randy [name] has tried Mucinex with minimal relief. Randy [name] does have DM 2. Last A1C was 6.4%. Randy [name] is UTD with flu and pneumonia vaccines. Randy [name] is a former smoker.  Review of Systems      Past Medical History  Diagnosis Date  . Dyslipidemia   . Type 2 diabetes with nephropathy ([place])   . CAD (coronary artery disease)   . Systolic dysfunction   . Vitamin D deficiency   . ICD (implantable cardiac defibrillator) in place     2009-St. Jude device  . Shortness of breath   . Pacemaker   . Sleep apnea     started a sleep study but didn't completed, states PCP told him Randy [name] has sleep  apnea, doesn't use CPAP  . GERD (gastroesophageal reflux disease)   . 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 Arthritis     hip, hands- Osteoarthritis    . Anemia   . Hypertension     g. taylor  . CKD (chronic kidney disease) stage 3, GFR 30-59 ml/min     [name]    Family History  Problem Relation Age of Onset  . Heart attack Father 50  . Diabetes Brother     Social History   Social History  . Marital Status: Single    Spouse Name: N/A  . Number of Children: 2  . Years of Education: N/A   Occupational History  . 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 Smoking status: Former Smoker    Types: Cigarettes    Quit date: 07/30/1984  . Smokeless tobacco: Never Used  . Alcohol Use: Yes     Comment: occ  . Drug Use: No  . Sexual Activity: Not on file   Other Topics Concern  . Not on file   Social History Narrative   Does not have a living will.   Desires CPR, does want life support if not futile.          No Known  Allergies   Constitutional: Positive fever. Denies headache, fatigue, abrupt weight changes.  HEENT:  Positive runny nose. Denies eye redness, eye pain, pressure behind the eyes, facial pain, nasal congestion, ear pain, ringing in the ears, wax buildup, or sore  throat. Respiratory: Positive cough and shortness of breath. Denies difficulty breathing or shortness of breath.  Cardiovascular: Denies chest pain, chest tightness, palpitations or swelling in the hands or feet.   No other specific complaints in a complete review of systems (except as listed in HPI above).  Objective:   BP 114/62 mmHg  Pulse 64  Temp(Src) 97.9 F (36.6 C) (Oral)  Wt 191 lb (86.637 kg)  SpO2 94% Wt Readings from Last 3 Encounters:  08/19/15 191 lb (86.637 kg)  08/16/15 194 lb 8 oz (88.225 kg)  07/07/15 197 lb 4 oz (89.472 kg)     General: Appears his stated age, ill appearng in NAD. HEENT: Head: normal shape and size, no sinus tenderness noted; Eyes: sclera white, no icterus, conjunctiva pink; Ears: Tm's pink but  intact, normal light reflex, +serous effusion bilaterally; Nose: mucosa  pink and moist, septum midline; Throat/Mouth: + PND. Teeth present, mucosa pink and moist, no exudate noted, no lesions or ulcerations noted.  Neck: No cervical lymphadenopathy.  Cardiovascular: Normal rate and rhythm. S1,S2 noted.  No murmur, rubs or gallops noted.  Pulmonary/Chest: Normal effort with crackles throughout. No respiratory distress.      Assessment & Plan:  Cough and Shortness of Breath:  Will check chest xray to r/ PNA versus pulmonary edema CBC, CMET and BNP today If infiltrate noted, will start Levaquin If edema, will give RX for Lasix x 3 days Continue Azithromax, Hycodan and Albuterol until further noticed To ER if worse  RTC as needed or if symptoms persist.

## 2015-08-23 ENCOUNTER — Encounter: Payer: Self-pay | Admitting: Internal Medicine

## 2015-08-23 ENCOUNTER — Ambulatory Visit (INDEPENDENT_AMBULATORY_CARE_PROVIDER_SITE_OTHER): Payer: Medicare Other | Admitting: Internal Medicine

## 2015-08-23 VITALS — BP 120/76 | HR 76 | Temp 98.2°F | Wt 190.0 lb

## 2015-08-23 DIAGNOSIS — J209 Acute bronchitis, unspecified: Secondary | ICD-10-CM | POA: Diagnosis not present

## 2015-08-23 MED ORDER — PREDNISONE 10 MG PO TABS: ORAL_TABLET | ORAL | Status: DC

## 2015-08-23 MED ORDER — LEVOFLOXACIN 500 MG PO TABS: 500.0000 mg | ORAL_TABLET | Freq: Every day | ORAL | Status: DC

## 2015-08-23 MED ORDER — HYDROCODONE-HOMATROPINE 5-1.5 MG/5ML PO SYRP: 5.0000 mL | ORAL_SOLUTION | Freq: Three times a day (TID) | ORAL | Status: DC | PRN

## 2015-08-23 NOTE — Progress Notes (Signed)
Pre visit review using our clinic review tool, if applicable. No additional management support is needed unless otherwise documented below in the visit note. 

## 2015-08-24 ENCOUNTER — Encounter: Payer: Self-pay | Admitting: Internal Medicine

## 2015-08-24 NOTE — Progress Notes (Signed)
Subjective:    Patient ID: Randy [name], male    DOB: [birth year], 70 y.o.   MRN: [id number]  HPI  Pt presents to the clinic today to follow up cough and shortness of breath. This originally started 2 months ago. He was seen 1/17 for the same. He was diagnosed with acute bronchitis, treated with Zpack and Hycodan. He returned 1/20 c/o worsening cough and SOB. Chest xray was negative for PNA. BNP was normal. WBC was not elevated. There was no change made to his treatment regimen. He returns today with c/o worsening cough and shortness of breath. He is unable to sleep at night because he is having trouble laying down at night. The cough is productive of yellow mucous. The cough and SOB at night. He denies chest pain, fever, chills or body aches. He has not tried anything additional OTC.  Review of Systems      Past Medical History  Diagnosis Date  . Dyslipidemia   . Type 2 diabetes with nephropathy ([place])   . CAD (coronary artery disease)   . Systolic dysfunction   . Vitamin D deficiency   . ICD (implantable cardiac defibrillator) in place     2009-St. Jude device  . Shortness of breath   . Pacemaker   . Sleep apnea     started a sleep study but didn't completed, states PCP told him he has sleep  apnea, doesn't use CPAP  . GERD (gastroesophageal reflux disease)   . 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 Arthritis     hip, hands- Osteoarthritis    . Anemia   . Hypertension     g. taylor  . CKD (chronic kidney disease) stage 3, GFR 30-59 ml/min     [name]    Current Outpatient Prescriptions  Medication Sig Dispense Refill  . albuterol (PROVENTIL HFA;VENTOLIN HFA) 108 (90 Base) MCG/ACT inhaler Inhale 2 puffs into the lungs every 6 (six) hours as needed for wheezing or shortness of breath. 1 Inhaler 0  . aspirin EC 81 MG tablet Take 81 mg by mouth daily.    . carvedilol (COREG) 6.25 MG tablet TAKE ONE TABLET BY MOUTH TWICE DAILY WITH  A  MEAL 60 tablet 2  .  gabapentin (NEURONTIN) 300 MG capsule 1 tablet by mouth every morning , 1 tablet my mouth mid day, and 2 tablets my mouth at bedtime. 90 capsule 3  . glipiZIDE (GLUCOTROL) 5 MG tablet TAKE ONE TABLET BY MOUTH DAILY BEFORE BREAKFAST 90 tablet 0  . lisinopril (PRINIVIL,ZESTRIL) 2.5 MG tablet Take 1 tablet (2.5 mg total) by mouth daily. 30 tablet 6  . lovastatin (MEVACOR) 20 MG tablet Take 1 tablet (20 mg total) by mouth at bedtime. 90 tablet 1  . meloxicam (MOBIC) 15 MG tablet TAKE ONE TABLET BY MOUTH ONCE DAILY 30 tablet 5  . tamsulosin (FLOMAX) 0.4 MG CAPS capsule Take 1 capsule (0.4 mg total) by mouth daily. 90 capsule 2  . traMADol (ULTRAM) 50 MG tablet Take 1 tablet (50 mg total) by mouth every 8 (eight) hours as needed. 30 tablet 0  . HYDROcodone-homatropine (HYCODAN) 5-1.5 MG/5ML syrup Take 5 mLs by mouth every 8 (eight) hours as needed for cough. 180 mL 0  . levofloxacin (LEVAQUIN) 500 MG tablet Take 1 tablet (500 mg total) by mouth daily. 7 tablet 0  . predniSONE (DELTASONE) 10 MG tablet Take 3 tabs on days 1-2, take 2 tabs on days 3-4, take 1 tab  on days 5-6 12 tablet 0   No current facility-administered medications for this visit.    No Known Allergies  Family History  Problem Relation Age of Onset  . Heart attack Father 37  . Diabetes Brother     Social History   Social History  . Marital Status: Single    Spouse Name: N/A  . Number of Children: 2  . Years of Education: N/A   Occupational History  . 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 Smoking status: Former Smoker    Types: Cigarettes    Quit date: 07/30/1984  . Smokeless tobacco: Never Used  . Alcohol Use: Yes     Comment: occ  . Drug Use: No  . Sexual Activity: Not on file   Other Topics Concern  . Not on file   Social History Narrative   Does not have a living will.   Desires CPR, does want life support if not futile.           Constitutional: Pt reports fatigue. Denies fever,  malaise, headache or abrupt weight changes.  HEENT: Pt reports sore throat. Denies eye pain, eye redness, ear pain, ringing in the ears, wax buildup, runny nose, nasal congestion, bloody nose. Respiratory: Pt reports cough and shortness of breath. Denies difficulty breathing or sputum production.   Cardiovascular: Denies chest pain, chest tightness, palpitations or swelling in the hands or feet.   No other specific complaints in a complete review of systems (except as listed in HPI above).  Objective:   Physical Exam   BP 120/76 mmHg  Pulse 76  Temp(Src) 98.2 F (36.8 C) (Oral)  Wt 190 lb (86.183 kg)  SpO2 93% Wt Readings from Last 3 Encounters:  08/23/15 190 lb (86.183 kg)  08/19/15 191 lb (86.637 kg)  08/16/15 194 lb 8 oz (88.225 kg)    General: Appears his stated age, well developed, well nourished in NAD. HEENT: Head: normal shape and size, no sinus tenderness noted; Eyes: sclera white, no icterus, conjunctiva pink; Ears: Tm's gray and intact, normal light reflex; Throat/Mouth: Teeth present, mucosa pink and moist, no exudate, lesions or ulcerations noted.  Neck:  No adenopathy.  Cardiovascular: Normal rate and rhythm. S1,S2 noted.  No murmur, rubs or gallops noted. No JVD or BLE edema.  Pulmonary/Chest: Normal effort and coarse breath sounds. No respiratory distress.   BMET    Component Value Date/Time   NA 137 08/19/2015 1419   K 5.2* 08/19/2015 1419   CL 101 08/19/2015 1419   CO2 29 08/19/2015 1419   GLUCOSE 119* 08/19/2015 1419   BUN 42* 08/19/2015 1419   CREATININE 1.55* 08/19/2015 1419   CALCIUM 9.4 08/19/2015 1419   GFRNONAA 62* 07/13/2013 1040   GFRAA 72* 07/13/2013 1040    Lipid Panel     Component Value Date/Time   CHOL 174 03/03/2015 0816   TRIG 164.0* 03/03/2015 0816   HDL 36.50* 03/03/2015 0816   CHOLHDL 5 03/03/2015 0816   VLDL 32.8 03/03/2015 0816   LDLCALC 105* 03/03/2015 0816    CBC    Component Value Date/Time   WBC 6.2 08/19/2015 1419     RBC 4.52 08/19/2015 1419   HGB 13.0 08/19/2015 1419   HCT 39.5 08/19/2015 1419   PLT 182.0 08/19/2015 1419   MCV 87.5 08/19/2015 1419   MCH 29.8 07/13/2013 1040   MCHC 32.8 08/19/2015 1419   RDW 13.1 08/19/2015 1419   LYMPHSABS 1.2 06/15/2014  [id number]   MONOABS 0.5 06/15/2014 0808   EOSABS 0.1 06/15/2014 0808   BASOSABS 0.0 06/15/2014 0808    Hgb A1C Lab Results  Component Value Date   HGBA1C 6.4 03/03/2015        Assessment & Plan:   Cough and shortness of breath:  Ongoing, not improving eRx for Pred Taper  eRx for Levaquin x 7 days Refilled Hycodan today  Follow up with PCP if symptoms persist or worsen

## 2015-08-24 NOTE — Addendum Note (Signed)
Addended by: Lurlean Nanny on: 08/24/2015 04:43 PM   Modules accepted: Orders

## 2015-08-24 NOTE — Patient Instructions (Signed)

## 2015-09-06 ENCOUNTER — Other Ambulatory Visit: Payer: Medicare Other

## 2015-09-06 ENCOUNTER — Ambulatory Visit (INDEPENDENT_AMBULATORY_CARE_PROVIDER_SITE_OTHER): Payer: Medicare Other | Admitting: Internal Medicine

## 2015-09-06 ENCOUNTER — Encounter: Payer: Self-pay | Admitting: Internal Medicine

## 2015-09-06 ENCOUNTER — Other Ambulatory Visit (INDEPENDENT_AMBULATORY_CARE_PROVIDER_SITE_OTHER): Payer: Medicare Other

## 2015-09-06 VITALS — BP 120/58 | HR 67 | Temp 98.1°F | Wt 187.0 lb

## 2015-09-06 DIAGNOSIS — L03012 Cellulitis of left finger: Secondary | ICD-10-CM | POA: Diagnosis not present

## 2015-09-06 DIAGNOSIS — S61309A Unspecified open wound of unspecified finger with damage to nail, initial encounter: Secondary | ICD-10-CM | POA: Diagnosis not present

## 2015-09-06 DIAGNOSIS — E876 Hypokalemia: Secondary | ICD-10-CM

## 2015-09-06 LAB — BASIC METABOLIC PANEL
BUN: 27 mg/dL — ABNORMAL HIGH (ref 6–23)
CALCIUM: 9 mg/dL (ref 8.4–10.5)
CHLORIDE: 103 meq/L (ref 96–112)
CO2: 28 meq/L (ref 19–32)
Creatinine, Ser: 1.29 mg/dL (ref 0.40–1.50)
GFR: 58.55 mL/min — ABNORMAL LOW (ref >60.00)
Glucose, Bld: 110 mg/dL — ABNORMAL HIGH (ref 70–99)
Potassium: 4.4 mEq/L (ref 3.5–5.1)
SODIUM: 137 meq/L (ref 135–145)

## 2015-09-06 MED ORDER — CEPHALEXIN 500 MG PO CAPS: 500.0000 mg | ORAL_CAPSULE | Freq: Three times a day (TID) | ORAL | Status: DC

## 2015-09-06 NOTE — Progress Notes (Signed)
Subjective:    Patient ID: Randy [name], male    DOB: [birth year], 70 y.o.   MRN: [id number]  HPI  Pt presents to the clinic today with c/o finger swelling. He noticed this about 1 week ago. The area is tender and he feels like the pain radiates up into his forearm. He has noticed some associated stiffness, redness and warmth of the fingers. His fingernail did come off 1 month prior. He has tried to keep the the area clean. He does have a history of DM 2 with neuropathy and osteoarthritis. He does take Meloxicam and Neurontin as prescribed.  Review of Systems      Past Medical History  Diagnosis Date  . Dyslipidemia   . Type 2 diabetes with nephropathy ([place])   . CAD (coronary artery disease)   . Systolic dysfunction   . Vitamin D deficiency   . ICD (implantable cardiac defibrillator) in place     2009-St. Jude device  . Shortness of breath   . Pacemaker   . Sleep apnea     started a sleep study but didn't completed, states PCP told him he has sleep  apnea, doesn't use CPAP  . GERD (gastroesophageal reflux disease)   . 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 Arthritis     hip, hands- Osteoarthritis    . Anemia   . Hypertension     g. taylor  . CKD (chronic kidney disease) stage 3, GFR 30-59 ml/min     [name]    Current Outpatient Prescriptions  Medication Sig Dispense Refill  . albuterol (PROVENTIL HFA;VENTOLIN HFA) 108 (90 Base) MCG/ACT inhaler Inhale 2 puffs into the lungs every 6 (six) hours as needed for wheezing or shortness of breath. 1 Inhaler 0  . aspirin EC 81 MG tablet Take 81 mg by mouth daily.    . carvedilol (COREG) 6.25 MG tablet TAKE ONE TABLET BY MOUTH TWICE DAILY WITH  A  MEAL 60 tablet 2  . gabapentin (NEURONTIN) 300 MG capsule 1 tablet by mouth every morning , 1 tablet my mouth mid day, and 2 tablets my mouth at bedtime. 90 capsule 3  . glipiZIDE (GLUCOTROL) 5 MG tablet TAKE ONE TABLET BY MOUTH DAILY BEFORE BREAKFAST 90 tablet 0  .  lisinopril (PRINIVIL,ZESTRIL) 2.5 MG tablet Take 1 tablet (2.5 mg total) by mouth daily. 30 tablet 6  . lovastatin (MEVACOR) 20 MG tablet Take 1 tablet (20 mg total) by mouth at bedtime. 90 tablet 1  . meloxicam (MOBIC) 15 MG tablet TAKE ONE TABLET BY MOUTH ONCE DAILY 30 tablet 5  . tamsulosin (FLOMAX) 0.4 MG CAPS capsule Take 1 capsule (0.4 mg total) by mouth daily. 90 capsule 2  . traMADol (ULTRAM) 50 MG tablet Take 1 tablet (50 mg total) by mouth every 8 (eight) hours as needed. 30 tablet 0   No current facility-administered medications for this visit.    No Known Allergies  Family History  Problem Relation Age of Onset  . Heart attack Father 63  . Diabetes Brother     Social History   Social History  . Marital Status: Single    Spouse Name: N/A  . Number of Children: 2  . Years of Education: N/A   Occupational History  . 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 Smoking status: Former Smoker    Types: Cigarettes    Quit date: 07/30/1984  . Smokeless tobacco:  Never Used  . Alcohol Use: Yes     Comment: occ  . Drug Use: No  . Sexual Activity: Not on file   Other Topics Concern  . Not on file   Social History Narrative   Does not have a living will.   Desires CPR, does want life support if not futile.           Constitutional: Denies fever, malaise, fatigue, headache or abrupt weight changes.  Musculoskeletal: Pt reports finger swelling and pain. Denies decrease in range of motion, difficulty with gait, muscle pain.  Skin: Pt reports redness and warmth of finger. Denies rashes, lesions or ulcercations.  Neurological: Denies dizziness, difficulty with memory, difficulty with speech or problems with balance and coordination.    No other specific complaints in a complete review of systems (except as listed in HPI above).  Objective:   Physical Exam  BP 120/58 mmHg  Pulse 67  Temp(Src) 98.1 F (36.7 C) (Oral)  Wt 187 lb (84.823 kg)  SpO2  95% Wt Readings from Last 3 Encounters:  09/06/15 187 lb (84.823 kg)  08/23/15 190 lb (86.183 kg)  08/19/15 191 lb (86.637 kg)    General: Appears his stated age, well developed, well nourished in NAD. Skin: Warm, dry and intact. Ulceration noted to the tip of the pointer finger, left hand. Redness and warmth noted up to the 1st DIP. Musculoskeletal: Decreased flexion and extension of the pointer finger, left hand. Bouchards and Herbdens nodes noted.  Neurological: Alert and oriented. Sensation intact to left hand.  BMET    Component Value Date/Time   NA 137 08/19/2015 1419   K 5.2* 08/19/2015 1419   CL 101 08/19/2015 1419   CO2 29 08/19/2015 1419   GLUCOSE 119* 08/19/2015 1419   BUN 42* 08/19/2015 1419   CREATININE 1.55* 08/19/2015 1419   CALCIUM 9.4 08/19/2015 1419   GFRNONAA 62* 07/13/2013 1040   GFRAA 72* 07/13/2013 1040    Lipid Panel     Component Value Date/Time   CHOL 174 03/03/2015 0816   TRIG 164.0* 03/03/2015 0816   HDL 36.50* 03/03/2015 0816   CHOLHDL 5 03/03/2015 0816   VLDL 32.8 03/03/2015 0816   LDLCALC 105* 03/03/2015 0816    CBC    Component Value Date/Time   WBC 6.2 08/19/2015 1419   RBC 4.52 08/19/2015 1419   HGB 13.0 08/19/2015 1419   HCT 39.5 08/19/2015 1419   PLT 182.0 08/19/2015 1419   MCV 87.5 08/19/2015 1419   MCH 29.8 07/13/2013 1040   MCHC 32.8 08/19/2015 1419   RDW 13.1 08/19/2015 1419   LYMPHSABS 1.2 06/15/2014 0808   MONOABS 0.5 06/15/2014 0808   EOSABS 0.1 06/15/2014 0808   BASOSABS 0.0 06/15/2014 0808    Hgb A1C Lab Results  Component Value Date   HGBA1C 6.4 03/03/2015         Assessment & Plan:   Cellulitis of left hand:  Advised him to soak hand in epsom salt Cover with Neosporin and bandaid, change daily eRx for Keflex 500 mg TID x 10 days If pain, redness, warmth persist- consider xray to r/o osteomyelitis  RTC as needed or if symptoms persist or worsen

## 2015-09-06 NOTE — Patient Instructions (Signed)

## 2015-09-06 NOTE — Progress Notes (Signed)
Pre visit review using our clinic review tool, if applicable. No additional management support is needed unless otherwise documented below in the visit note. 

## 2015-09-13 ENCOUNTER — Ambulatory Visit (INDEPENDENT_AMBULATORY_CARE_PROVIDER_SITE_OTHER): Payer: Medicare Other | Admitting: *Deleted

## 2015-09-13 DIAGNOSIS — I5022 Chronic systolic (congestive) heart failure: Secondary | ICD-10-CM | POA: Diagnosis not present

## 2015-09-13 DIAGNOSIS — Z95810 Presence of automatic (implantable) cardiac defibrillator: Secondary | ICD-10-CM

## 2015-09-13 NOTE — Progress Notes (Signed)
Remote ICD transmission.   

## 2015-09-14 ENCOUNTER — Encounter: Payer: Self-pay | Admitting: Cardiology

## 2015-09-14 LAB — CUP PACEART REMOTE DEVICE CHECK
Battery Remaining Longevity: 41 mo
Battery Voltage: 2.6 V
HIGH POWER IMPEDANCE MEASURED VALUE: 68 Ohm
HIGH POWER IMPEDANCE MEASURED VALUE: 68 Ohm
Implantable Lead Implant Date: 20091022
Implantable Lead Location: 753860
Implantable Lead Model: 7121
Lead Channel Impedance Value: 430 Ohm
Lead Channel Setting Pacing Amplitude: 2.5 V
Lead Channel Setting Pacing Pulse Width: 0.8 ms
MDC IDC PG SERIAL: 663191
MDC IDC SESS DTM: 20170214072032
MDC IDC SET LEADCHNL RV SENSING SENSITIVITY: 0.3 mV
MDC IDC STAT BRADY RV PERCENT PACED: 1 %

## 2015-10-18 ENCOUNTER — Other Ambulatory Visit: Payer: Self-pay | Admitting: Family Medicine

## 2015-10-18 ENCOUNTER — Other Ambulatory Visit: Payer: Self-pay | Admitting: Internal Medicine

## 2015-10-19 NOTE — Telephone Encounter (Signed)
Last labs abnormal. pls advise 

## 2015-11-07 ENCOUNTER — Other Ambulatory Visit: Payer: Self-pay | Admitting: Family Medicine

## 2015-11-11 ENCOUNTER — Other Ambulatory Visit: Payer: Self-pay | Admitting: Family Medicine

## 2015-11-15 ENCOUNTER — Other Ambulatory Visit: Payer: Self-pay | Admitting: Family Medicine

## 2015-11-20 ENCOUNTER — Other Ambulatory Visit: Payer: Self-pay | Admitting: Family Medicine

## 2015-11-21 NOTE — Telephone Encounter (Signed)
Last labs abnormal. pls advise 

## 2015-11-23 ENCOUNTER — Other Ambulatory Visit: Payer: Self-pay | Admitting: Family Medicine

## 2015-12-06 ENCOUNTER — Other Ambulatory Visit: Payer: Self-pay | Admitting: Family Medicine

## 2015-12-13 ENCOUNTER — Encounter: Payer: Self-pay | Admitting: Family Medicine

## 2015-12-13 ENCOUNTER — Ambulatory Visit (INDEPENDENT_AMBULATORY_CARE_PROVIDER_SITE_OTHER): Payer: Medicare Other | Admitting: *Deleted

## 2015-12-13 ENCOUNTER — Ambulatory Visit (INDEPENDENT_AMBULATORY_CARE_PROVIDER_SITE_OTHER): Payer: Medicare Other | Admitting: Family Medicine

## 2015-12-13 VITALS — BP 126/52 | HR 70 | Temp 98.7°F | Wt 193.5 lb

## 2015-12-13 DIAGNOSIS — Z95810 Presence of automatic (implantable) cardiac defibrillator: Secondary | ICD-10-CM | POA: Diagnosis not present

## 2015-12-13 DIAGNOSIS — J069 Acute upper respiratory infection, unspecified: Secondary | ICD-10-CM | POA: Diagnosis not present

## 2015-12-13 DIAGNOSIS — J209 Acute bronchitis, unspecified: Secondary | ICD-10-CM

## 2015-12-13 MED ORDER — HYDROCODONE-HOMATROPINE 5-1.5 MG/5ML PO SYRP: 5.0000 mL | ORAL_SOLUTION | Freq: Three times a day (TID) | ORAL | Status: DC | PRN

## 2015-12-13 MED ORDER — LEVOFLOXACIN 500 MG PO TABS: 500.0000 mg | ORAL_TABLET | Freq: Every day | ORAL | Status: DC

## 2015-12-13 NOTE — Progress Notes (Signed)
SUBJECTIVE:  Randy [name] is a 70 y.o. male who complains of coryza, congestion, sore throat, productive cough and wheezing for 8 days. He denies a history of chest pain and chills and admits to a history of asthma. Patient denies smoke cigarettes.   Current Outpatient Prescriptions on File Prior to Visit  Medication Sig Dispense Refill  . albuterol (PROVENTIL HFA;VENTOLIN HFA) 108 (90 Base) MCG/ACT inhaler Inhale 2 puffs into the lungs every 6 (six) hours as needed for wheezing or shortness of breath. 1 Inhaler 0  . aspirin EC 81 MG tablet Take 81 mg by mouth daily.    . carvedilol (COREG) 6.25 MG tablet TAKE ONE TABLET BY MOUTH TWICE DAILY WITH A MEAL 60 tablet 3  . cephALEXin (KEFLEX) 500 MG capsule Take 1 capsule (500 mg total) by mouth 3 (three) times daily. 30 capsule 0  . gabapentin (NEURONTIN) 300 MG capsule TAKE ONE CAPSULE BY MOUTH ONCE DAILY IN THE MORNING,  TAKE  ONE  CAPSULE  BY  MOUTH  MIDDAY,  AND  TAKE  TWO  CAPSULES  AT  BEDTIME 90 capsule 0  . glipiZIDE (GLUCOTROL) 5 MG tablet TAKE ONE TABLET BY MOUTH DAILY BEFORE BREAKFAST 90 tablet 0  . glipiZIDE (GLUCOTROL) 5 MG tablet TAKE ONE TABLET BY MOUTH DAILY BEFORE BREAKFAST 90 tablet 1  . lisinopril (PRINIVIL,ZESTRIL) 2.5 MG tablet TAKE ONE TABLET BY MOUTH ONCE DAILY 30 tablet 3  . lovastatin (MEVACOR) 20 MG tablet TAKE ONE TABLET BY MOUTH AT BEDTIME 90 tablet 0  . meloxicam (MOBIC) 15 MG tablet TAKE ONE TABLET BY MOUTH ONCE DAILY 30 tablet 7  . tamsulosin (FLOMAX) 0.4 MG CAPS capsule Take 1 capsule (0.4 mg total) by mouth daily. 90 capsule 2  . traMADol (ULTRAM) 50 MG tablet Take 1 tablet (50 mg total) by mouth every 8 (eight) hours as needed. 30 tablet 0   No current facility-administered medications on file prior to visit.    No Known Allergies  Past Medical History  Diagnosis Date  . Dyslipidemia   . Type 2 diabetes with nephropathy ([place])   . CAD (coronary artery disease)   . Systolic dysfunction   . Vitamin D  deficiency   . ICD (implantable cardiac defibrillator) in place     2009-St. Jude device  . Shortness of breath   . Pacemaker   . Sleep apnea     started a sleep study but didn't completed, states PCP told him he has sleep  apnea, doesn't use CPAP  . GERD (gastroesophageal reflux disease)   . 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 Arthritis     hip, hands- Osteoarthritis    . Anemia   . Hypertension     g. taylor  . CKD (chronic kidney disease) stage 3, GFR 30-59 ml/min     [name]    Past Surgical History  Procedure Laterality Date  . Left carpal tunnel release    . Left rotator cuff surgery    . Back surgery      lumbar & cervical fusion   . Insert / replace / remove pacemaker    . Total hip arthroplasty Left 09/17/2012    Dr Lorin Mercy  . Total hip arthroplasty Left 09/17/2012    Procedure: Left TOTAL HIP ARTHROPLASTY ANTERIOR APPROACH;  Surgeon: Marybelle Killings, MD;  Location: [place];  Service: Orthopedics;  Laterality: Left;  . Lumbar laminectomy Left 07/17/2013    Procedure: MICRODISCECTOMY LUMBAR LAMINECTOMY;  Surgeon: Marybelle Killings, MD;  Location: [place] [facility];  Service: Orthopedics;  Laterality: Left;  Left L4 Laminotomy, Lateral Recess Decompression, Cyst Excision    Family History  Problem Relation Age of Onset  . Heart attack Father 2  . Diabetes Brother     Social History   Social History  . Marital Status: Single    Spouse Name: N/A  . Number of Children: 2  . Years of Education: N/A   Occupational History  . 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 Smoking status: Former Smoker    Types: Cigarettes    Quit date: 07/30/1984  . Smokeless tobacco: Never Used  . Alcohol Use: Yes     Comment: occ  . Drug Use: No  . Sexual Activity: Not on file   Other Topics Concern  . Not on file   Social History Narrative   Does not have a living will.   Desires CPR, does want life support if not futile.         The PMH, PSH, Social  History, Family History, Medications, and allergies have been reviewed in [facility], and have been updated if relevant.  OBJECTIVE: BP 126/52 mmHg  Pulse 70  Temp(Src) 98.7 F (37.1 C) (Oral)  Wt 193 lb 8 oz (87.771 kg)  SpO2 94%  He appears well, vital signs are as noted. Ears normal.  Throat and pharynx normal.  Neck supple. No adenopathy in the neck. Nose is congested. Sinuses non tender. Decreased BS at bases and scattered wheezes throughout  ASSESSMENT:  bronchitis  PLAN: Given duration and progression of symptoms, will treat for bacterial process with levaquin. Rx for hycodan printed and given to pt.  Symptomatic therapy suggested: push fluids, rest and return office visit prn if symptoms persist or worsen.Call or return to clinic prn if these symptoms worsen or fail to improve as anticipated.

## 2015-12-13 NOTE — Progress Notes (Signed)
Remote ICD transmission.   

## 2015-12-13 NOTE — Progress Notes (Signed)
Pre visit review using our clinic review tool, if applicable. No additional management support is needed unless otherwise documented below in the visit note. 

## 2016-01-05 LAB — CUP PACEART REMOTE DEVICE CHECK
Battery Remaining Longevity: 38 mo
Battery Voltage: 2.59 V
Brady Statistic RV Percent Paced: 1 %
Date Time Interrogation Session: 20170516060009
HIGH POWER IMPEDANCE MEASURED VALUE: 69 Ohm
HIGH POWER IMPEDANCE MEASURED VALUE: 69 Ohm
Implantable Lead Implant Date: 20091022
Implantable Lead Model: 7121
Lead Channel Impedance Value: 410 Ohm
Lead Channel Sensing Intrinsic Amplitude: 5.4 mV
Lead Channel Setting Pacing Pulse Width: 0.8 ms
MDC IDC LEAD LOCATION: 753860
MDC IDC MSMT LEADCHNL RV PACING THRESHOLD AMPLITUDE: 1.25 V
MDC IDC MSMT LEADCHNL RV PACING THRESHOLD PULSEWIDTH: 0.8 ms
MDC IDC PG SERIAL: 663191
MDC IDC SET LEADCHNL RV PACING AMPLITUDE: 2.5 V
MDC IDC SET LEADCHNL RV SENSING SENSITIVITY: 0.3 mV

## 2016-01-12 ENCOUNTER — Other Ambulatory Visit: Payer: Self-pay | Admitting: Family Medicine

## 2016-01-12 ENCOUNTER — Encounter: Payer: Self-pay | Admitting: Cardiology

## 2016-01-17 ENCOUNTER — Other Ambulatory Visit: Payer: Self-pay | Admitting: Family Medicine

## 2016-03-08 ENCOUNTER — Telehealth: Payer: Self-pay

## 2016-03-08 DIAGNOSIS — E1121 Type 2 diabetes mellitus with diabetic nephropathy: Secondary | ICD-10-CM

## 2016-03-08 DIAGNOSIS — Z1159 Encounter for screening for other viral diseases: Secondary | ICD-10-CM

## 2016-03-08 DIAGNOSIS — Z125 Encounter for screening for malignant neoplasm of prostate: Secondary | ICD-10-CM

## 2016-03-08 DIAGNOSIS — I10 Essential (primary) hypertension: Secondary | ICD-10-CM

## 2016-03-08 DIAGNOSIS — E785 Hyperlipidemia, unspecified: Secondary | ICD-10-CM

## 2016-03-08 NOTE — Telephone Encounter (Signed)
CPE labs

## 2016-03-09 ENCOUNTER — Other Ambulatory Visit (INDEPENDENT_AMBULATORY_CARE_PROVIDER_SITE_OTHER): Payer: Medicare Other

## 2016-03-09 ENCOUNTER — Ambulatory Visit (INDEPENDENT_AMBULATORY_CARE_PROVIDER_SITE_OTHER): Payer: Medicare Other

## 2016-03-09 ENCOUNTER — Other Ambulatory Visit: Payer: Self-pay | Admitting: Internal Medicine

## 2016-03-09 VITALS — BP 112/58 | HR 55 | Temp 97.7°F | Ht 67.0 in | Wt 194.5 lb

## 2016-03-09 DIAGNOSIS — I10 Essential (primary) hypertension: Secondary | ICD-10-CM | POA: Diagnosis not present

## 2016-03-09 DIAGNOSIS — Z125 Encounter for screening for malignant neoplasm of prostate: Secondary | ICD-10-CM

## 2016-03-09 DIAGNOSIS — Z1159 Encounter for screening for other viral diseases: Secondary | ICD-10-CM | POA: Diagnosis not present

## 2016-03-09 DIAGNOSIS — E785 Hyperlipidemia, unspecified: Secondary | ICD-10-CM

## 2016-03-09 DIAGNOSIS — Z0000 Encounter for general adult medical examination without abnormal findings: Secondary | ICD-10-CM | POA: Diagnosis not present

## 2016-03-09 DIAGNOSIS — Z23 Encounter for immunization: Secondary | ICD-10-CM

## 2016-03-09 DIAGNOSIS — E1121 Type 2 diabetes mellitus with diabetic nephropathy: Secondary | ICD-10-CM

## 2016-03-09 LAB — TSH: TSH: 1.11 u[IU]/mL (ref 0.35–4.50)

## 2016-03-09 LAB — COMPREHENSIVE METABOLIC PANEL
ALK PHOS: 44 U/L (ref 39–117)
ALT: 14 U/L (ref 0–53)
AST: 19 U/L (ref 0–37)
Albumin: 4.4 g/dL (ref 3.5–5.2)
BILIRUBIN TOTAL: 0.4 mg/dL (ref 0.2–1.2)
BUN: 35 mg/dL — ABNORMAL HIGH (ref 6–23)
CALCIUM: 9.3 mg/dL (ref 8.4–10.5)
CO2: 29 meq/L (ref 19–32)
CREATININE: 1.56 mg/dL — AB (ref 0.40–1.50)
Chloride: 103 mEq/L (ref 96–112)
GFR: 46.95 mL/min — ABNORMAL LOW (ref >60.00)
Glucose, Bld: 118 mg/dL — ABNORMAL HIGH (ref 70–99)
Potassium: 5.3 mEq/L — ABNORMAL HIGH (ref 3.5–5.1)
Sodium: 138 mEq/L (ref 135–145)
TOTAL PROTEIN: 6.9 g/dL (ref 6.0–8.3)

## 2016-03-09 LAB — LIPID PANEL
CHOL/HDL RATIO: 6
CHOLESTEROL: 180 mg/dL (ref 0–200)
HDL: 32.7 mg/dL — ABNORMAL LOW (ref >39.00)
LDL Cholesterol: 115 mg/dL — ABNORMAL HIGH (ref 0–99)
NonHDL: 147.57
TRIGLYCERIDES: 165 mg/dL — AB (ref 0.0–149.0)
VLDL: 33 mg/dL (ref 0.0–40.0)

## 2016-03-09 LAB — HEMOGLOBIN A1C: HEMOGLOBIN A1C: 6.4 % (ref 4.6–6.5)

## 2016-03-09 LAB — MICROALBUMIN / CREATININE URINE RATIO
CREATININE, U: 152.2 mg/dL
MICROALB/CREAT RATIO: 4.1 mg/g (ref 0.0–30.0)
Microalb, Ur: 6.2 mg/dL — ABNORMAL HIGH (ref 0.0–1.9)

## 2016-03-09 LAB — PSA, MEDICARE: PSA: 1.01 ng/ml (ref 0.10–4.00)

## 2016-03-09 NOTE — Progress Notes (Signed)
PCP notes:   Health maintenance:  Hep C screening - completed A1C - completed PPSV23 - administered Foot exam - will completed at CPE Flu vaccine - addressed Eye exam - addressed; pt encouraged to schedule in 05/2016  Abnormal screenings:   Hearing - failed  Patient concerns:   Pt reports onset of increased neuropathy in both hands approx. 2-3 months ago. Pt wants to discuss medications at next appt.   Nurse concerns:  None  Next PCP appt:   03/19/16 @ 0900

## 2016-03-09 NOTE — Progress Notes (Signed)
Subjective:   Randy [name] is a 70 y.o. male who presents for Medicare Annual/Subsequent preventive examination.  Review of Systems:  N/A Cardiac Risk Factors include: male gender;advanced age (>74men, >38 women);diabetes mellitus;dyslipidemia;hypertension     Objective:    Vitals: BP (!) 112/58 (BP Location: Right Arm, Patient Position: Sitting, Cuff Size: Normal)   Pulse (!) 55   Temp 97.7 F (36.5 C) (Oral)   Ht 5\' 7"  (1.702 m) Comment: no shoes  Wt 194 lb 8 oz (88.2 kg)   SpO2 97%   BMI 30.46 kg/m   Body mass index is 30.46 kg/m.  Tobacco History  Smoking Status  . Former Smoker  . Types: Cigarettes  . Quit date: 07/30/1984  Smokeless Tobacco  . Never Used     Counseling given: No   Past Medical History:  Diagnosis Date  . Anemia   . Arthritis    hip, hands- Osteoarthritis    . CAD (coronary artery disease)   . CKD (chronic kidney disease) stage 3, GFR 30-59 ml/min    [name]  . Dyslipidemia   . GERD (gastroesophageal reflux disease)   . Hypertension    g. taylor  . ICD (implantable cardiac defibrillator) in place    2009-St. Jude device  . Neuromuscular disorder (HCC)    neuropathy- in hands  . Pacemaker   . Shortness of breath   . Sleep apnea    started a sleep study but didn't completed, states PCP told him he has sleep  apnea, doesn't use CPAP  . Systolic dysfunction   . Type 2 diabetes with nephropathy ([place])   . Vitamin D deficiency    Past Surgical History:  Procedure Laterality Date  . BACK SURGERY     lumbar & cervical fusion   . INSERT / REPLACE / REMOVE PACEMAKER    . left carpal tunnel release    . left rotator cuff surgery    . LUMBAR LAMINECTOMY Left 07/17/2013   Procedure: MICRODISCECTOMY LUMBAR LAMINECTOMY;  Surgeon: Marybelle Killings, MD;  Location: [place];  Service: Orthopedics;  Laterality: Left;  Left L4 Laminotomy, Lateral Recess Decompression, Cyst Excision  . TOTAL HIP ARTHROPLASTY Left 09/17/2012   Dr Lorin Mercy  . TOTAL HIP  ARTHROPLASTY Left 09/17/2012   Procedure: Left TOTAL HIP ARTHROPLASTY ANTERIOR APPROACH;  Surgeon: Marybelle Killings, MD;  Location: [facility];  Service: Orthopedics;  Laterality: Left;   Family History  Problem Relation Age of Onset  . Heart attack Father 10  . Diabetes Brother    History  Sexual Activity  . Sexual activity: No    Outpatient Encounter Prescriptions as of 03/09/2016  Medication Sig  . albuterol (PROVENTIL HFA;VENTOLIN HFA) 108 (90 Base) MCG/ACT inhaler Inhale 2 puffs into the lungs every 6 (six) hours as needed for wheezing or shortness of breath.  Marland Kitchen aspirin EC 81 MG tablet Take 81 mg by mouth daily.  . carvedilol (COREG) 6.25 MG tablet TAKE ONE TABLET BY MOUTH TWICE DAILY WITH A MEAL  . gabapentin (NEURONTIN) 300 MG capsule TAKE ONE CAPSULE BY MOUTH ONCE DAILY IN THE MORNING , TAKE ONE CAPSULE BY MOUTH MIDDAY, AND TAKE TWO CAPSULES AT BEDTIME  . glipiZIDE (GLUCOTROL) 5 MG tablet TAKE ONE TABLET BY MOUTH DAILY BEFORE BREAKFAST  . lisinopril (PRINIVIL,ZESTRIL) 2.5 MG tablet TAKE ONE TABLET BY MOUTH ONCE DAILY  . lovastatin (MEVACOR) 20 MG tablet TAKE ONE TABLET BY MOUTH AT BEDTIME  . tamsulosin (FLOMAX) 0.4 MG CAPS capsule Take 1 capsule (  0.4 mg total) by mouth daily.  . [DISCONTINUED] glipiZIDE (GLUCOTROL) 5 MG tablet TAKE ONE TABLET BY MOUTH DAILY BEFORE BREAKFAST  . [DISCONTINUED] HYDROcodone-homatropine (HYCODAN) 5-1.5 MG/5ML syrup Take 5 mLs by mouth every 8 (eight) hours as needed for cough.  . [DISCONTINUED] levofloxacin (LEVAQUIN) 500 MG tablet Take 1 tablet (500 mg total) by mouth daily.  . [DISCONTINUED] meloxicam (MOBIC) 15 MG tablet TAKE ONE TABLET BY MOUTH ONCE DAILY (Patient not taking: Reported on 03/09/2016)  . [DISCONTINUED] traMADol (ULTRAM) 50 MG tablet Take 1 tablet (50 mg total) by mouth every 8 (eight) hours as needed.   No facility-administered encounter medications on file as of 03/09/2016.     Activities of Daily Living In your present state of health, do  you have any difficulty performing the following activities: 03/09/2016  Hearing? Y  Vision? N  Difficulty concentrating or making decisions? Y  Walking or climbing stairs? N  Dressing or bathing? N  Doing errands, shopping? N  Preparing Food and eating ? N  Using the Toilet? N  In the past six months, have you accidently leaked urine? N  Do you have problems with loss of bowel control? N  Managing your Medications? N  Managing your Finances? N  Housekeeping or managing your Housekeeping? N  Some recent data might be hidden    Patient Care Team: Lucille Passy, MD as PCP - General Evans Lance, MD as Consulting Physician (Cardiology) Marybelle Killings, MD as Consulting Physician (Orthopedic Surgery)   Assessment:     Hearing Screening   125Hz  250Hz  500Hz  1000Hz  2000Hz  3000Hz  4000Hz  6000Hz  8000Hz   Right ear:   40 40 40  0    Left ear:   40 40 40  0      Visual Acuity Screening   Right eye Left eye Both eyes  Without correction: 20/30 20/40 20/25   With correction:       Exercise Activities and Dietary recommendations Current Exercise Habits: Home exercise routine, Type of exercise: walking, Time (Minutes): 30, Frequency (Times/Week): 4, Weekly Exercise (Minutes/Week): 120, Intensity: Moderate, Exercise limited by: None identified  Goals    . Increase physical activity          Starting 03/09/2016, I will continue to exercise for at least 30 min 3-4 days per week.       Fall Risk Fall Risk  03/09/2016 06/22/2014  Falls in the past year? No No   Depression Screen PHQ 2/9 Scores 03/09/2016 06/22/2014 06/02/2012  PHQ - 2 Score 0 0 0    Cognitive Testing MMSE - Mini Mental State Exam 03/09/2016  Orientation to time 5  Orientation to Place 5  Registration 3  Attention/ Calculation 0  Recall 3  Language- name 2 objects 0  Language- repeat 1  Language- follow 3 step command 3  Language- read & follow direction 0  Write a sentence 0  Copy design 0  Total score 20    PLEASE NOTE: A Mini-Cog screen was completed. Maximum score is 20. A value of 0 denotes this part of Folstein MMSE was not completed or the patient failed this part of the Mini-Cog screening.   Mini-Cog Screening Orientation to Time - Max 5 pts Orientation to Place - Max 5 pts Registration - Max 3 pts Recall - Max 3 pts Language Repeat - Max 1 pts Language Follow 3 Step Command - Max 3 pts   Immunization History  Administered Date(s) Administered  . Influenza Split 06/02/2012  .  Influenza Whole 05/18/2010  . Influenza,inj,Quad PF,36+ Mos 04/23/2013, 03/22/2014, 07/07/2015  . Pneumococcal Conjugate-13 06/22/2014  . Pneumococcal Polysaccharide-23 03/09/2016  . Td 01/14/2008  . Zoster 01/11/2010   Screening Tests Health Maintenance  Topic Date Due  . FOOT EXAM  03/19/2016 (Originally 03/02/2016)  . INFLUENZA VACCINE  07/29/2016 (Originally 02/28/2016)  . OPHTHALMOLOGY EXAM  07/29/2016 (Originally 05/31/2015)  . HEMOGLOBIN A1C  09/09/2016  . TETANUS/TDAP  01/13/2018  . COLONOSCOPY  02/07/2020  . ZOSTAVAX  Completed  . Hepatitis C Screening  Completed  . PNA vac Low Risk Adult  Completed      Plan:     I have personally reviewed and addressed the Medicare Annual Wellness questionnaire and have noted the following in the patient's chart:  A. Medical and social history B. Use of alcohol, tobacco or illicit drugs  C. Current medications and supplements D. Functional ability and status E.  Nutritional status F.  Physical activity G. Advance directives H. List of other physicians I.  Hospitalizations, surgeries, and ER visits in previous 12 months J.  [place] to include hearing, vision, cognitive, depression L. Referrals and appointments - none  In addition, I have reviewed and discussed with patient certain preventive protocols, quality metrics, and best practice recommendations. A written personalized care plan for preventive services as well as general  preventive health recommendations were provided to patient.  See attached scanned questionnaire for additional information.   Signed,   Lindell Noe, MHA, BS, LPN Health Advisor

## 2016-03-09 NOTE — Patient Instructions (Addendum)
Randy [name] , Thank you for taking time to come for your Medicare Wellness Visit. I appreciate your ongoing commitment to your health goals. Please review the following plan we discussed and let me know if I can assist you in the future.   These are the goals we discussed: Goals    . Increase physical activity          Starting 03/09/2016, I will continue to exercise for at least 30 min 3-4 days per week.        This is a list of the screening recommended for you and due dates:  Health Maintenance  Topic Date Due  . Complete foot exam   03/19/2016*  . Flu Shot  07/29/2016*  . Eye exam for diabetics  07/29/2016*  . Hemoglobin A1C  09/09/2016  . Tetanus Vaccine  01/13/2018  . Colon Cancer Screening  02/07/2020  . Shingles Vaccine  Completed  .  Hepatitis C: One time screening is recommended by Center for Disease Control  (CDC) for  adults born from [birth year] through 1965.   Completed  . Pneumonia vaccines  Completed  *Topic was postponed. The date shown is not the original due date.    Preventive Care for Adults  A healthy lifestyle and preventive care can promote health and wellness. Preventive health guidelines for adults include the following key practices.  . A routine yearly physical is a good way to check with your health care provider about your health and preventive screening. It is a chance to share any concerns and updates on your health and to receive a thorough exam.  . Visit your dentist for a routine exam and preventive care every 6 months. Brush your teeth twice a day and floss once a day. Good oral hygiene prevents tooth decay and gum disease.  . The frequency of eye exams is based on your age, health, family medical history, use  of contact lenses, and other factors. Follow your health care provider's ecommendations for frequency of eye exams.  . Eat a healthy diet. Foods like vegetables, fruits, whole grains, low-fat dairy products, and lean protein foods contain the  nutrients you need without too many calories. Decrease your intake of foods high in solid fats, added sugars, and salt. Eat the right amount of calories for you. Get information about a proper diet from your health care provider, if necessary.  . Regular physical exercise is one of the most important things you can do for your health. Most adults should get at least 150 minutes of moderate-intensity exercise (any activity that increases your heart rate and causes you to sweat) each week. In addition, most adults need muscle-strengthening exercises on 2 or more days a week.  Silver Sneakers may be a benefit available to you. To determine eligibility, you may visit the website: www.silversneakers.com or contact program at (415)091-9857 Mon-Fri between 8AM-8PM.   . Maintain a healthy weight. The body mass index (BMI) is a screening tool to identify possible weight problems. It provides an estimate of body fat based on height and weight. Your health care provider can find your BMI and can help you achieve or maintain a healthy weight.   For adults 20 years and older: ? A BMI below 18.5 is considered underweight. ? A BMI of 18.5 to 24.9 is normal. ? A BMI of 25 to 29.9 is considered overweight. ? A BMI of 30 and above is considered obese.   . Maintain normal blood lipids and cholesterol levels by  exercising and minimizing your intake of saturated fat. Eat a balanced diet with plenty of fruit and vegetables. Blood tests for lipids and cholesterol should begin at age 80 and be repeated every 5 years. If your lipid or cholesterol levels are high, you are over 50, or you are at high risk for heart disease, you may need your cholesterol levels checked more frequently. Ongoing high lipid and cholesterol levels should be treated with medicines if diet and exercise are not working.  . If you smoke, find out from your health care provider how to quit. If you do not use tobacco, please do not start.  . If you  choose to drink alcohol, please do not consume more than 2 drinks per day. One drink is considered to be 12 ounces (355 mL) of beer, 5 ounces (148 mL) of wine, or 1.5 ounces (44 mL) of liquor.  . If you are 79-49 years old, ask your health care provider if you should take aspirin to prevent strokes.  . Use sunscreen. Apply sunscreen liberally and repeatedly throughout the day. You should seek shade when your shadow is shorter than you. Protect yourself by wearing long sleeves, pants, a wide-brimmed hat, and sunglasses year round, whenever you are outdoors.  . Once a month, do a whole body skin exam, using a mirror to look at the skin on your back. Tell your health care provider of new moles, moles that have irregular borders, moles that are larger than a pencil eraser, or moles that have changed in shape or color.

## 2016-03-09 NOTE — Progress Notes (Signed)
I reviewed health advisor's note, was available for consultation, and agree with documentation and plan.  Amy Bedsole, MD [facility] [place] [place]  

## 2016-03-09 NOTE — Progress Notes (Signed)
Pre visit review using our clinic review tool, if applicable. No additional management support is needed unless otherwise documented below in the visit note. 

## 2016-03-10 LAB — HEPATITIS C ANTIBODY: HCV AB: NEGATIVE

## 2016-03-13 ENCOUNTER — Ambulatory Visit (INDEPENDENT_AMBULATORY_CARE_PROVIDER_SITE_OTHER): Payer: Medicare Other | Admitting: *Deleted

## 2016-03-13 DIAGNOSIS — Z95810 Presence of automatic (implantable) cardiac defibrillator: Secondary | ICD-10-CM

## 2016-03-13 DIAGNOSIS — I5022 Chronic systolic (congestive) heart failure: Secondary | ICD-10-CM

## 2016-03-13 NOTE — Progress Notes (Signed)
Remote ICD transmission.   

## 2016-03-14 ENCOUNTER — Encounter: Payer: Self-pay | Admitting: Cardiology

## 2016-03-16 LAB — CUP PACEART REMOTE DEVICE CHECK
Battery Remaining Longevity: 38 mo
Battery Voltage: 2.59 V
Brady Statistic RV Percent Paced: 1 %
Date Time Interrogation Session: 20170815062651
HighPow Impedance: 65 Ohm
HighPow Impedance: 65 Ohm
Implantable Lead Implant Date: 20091022
Implantable Lead Location: 753860
Implantable Lead Model: 7121
Lead Channel Impedance Value: 430 Ohm
Lead Channel Pacing Threshold Amplitude: 1.25 V
Lead Channel Pacing Threshold Pulse Width: 0.8 ms
Lead Channel Sensing Intrinsic Amplitude: 6.4 mV
Lead Channel Setting Pacing Amplitude: 2.5 V
Lead Channel Setting Pacing Pulse Width: 0.8 ms
Lead Channel Setting Sensing Sensitivity: 0.3 mV
Pulse Gen Serial Number: 663191

## 2016-03-19 ENCOUNTER — Ambulatory Visit (INDEPENDENT_AMBULATORY_CARE_PROVIDER_SITE_OTHER): Payer: Medicare Other | Admitting: Family Medicine

## 2016-03-19 ENCOUNTER — Encounter: Payer: Self-pay | Admitting: Family Medicine

## 2016-03-19 VITALS — BP 130/62 | HR 57 | Temp 98.1°F | Ht 67.5 in | Wt 197.5 lb

## 2016-03-19 DIAGNOSIS — I10 Essential (primary) hypertension: Secondary | ICD-10-CM | POA: Diagnosis not present

## 2016-03-19 DIAGNOSIS — I5022 Chronic systolic (congestive) heart failure: Secondary | ICD-10-CM

## 2016-03-19 DIAGNOSIS — N183 Chronic kidney disease, stage 3 unspecified: Secondary | ICD-10-CM

## 2016-03-19 DIAGNOSIS — E0843 Diabetes mellitus due to underlying condition with diabetic autonomic (poly)neuropathy: Secondary | ICD-10-CM | POA: Diagnosis not present

## 2016-03-19 DIAGNOSIS — E785 Hyperlipidemia, unspecified: Secondary | ICD-10-CM | POA: Diagnosis not present

## 2016-03-19 DIAGNOSIS — Z0000 Encounter for general adult medical examination without abnormal findings: Secondary | ICD-10-CM | POA: Diagnosis not present

## 2016-03-19 DIAGNOSIS — Z95810 Presence of automatic (implantable) cardiac defibrillator: Secondary | ICD-10-CM

## 2016-03-19 DIAGNOSIS — E1121 Type 2 diabetes mellitus with diabetic nephropathy: Secondary | ICD-10-CM | POA: Diagnosis not present

## 2016-03-19 MED ORDER — GABAPENTIN 300 MG PO CAPS: 600.0000 mg | ORAL_CAPSULE | Freq: Three times a day (TID) | ORAL | 1 refills | Status: DC

## 2016-03-19 NOTE — Assessment & Plan Note (Signed)
Recently interrogated.

## 2016-03-19 NOTE — Assessment & Plan Note (Signed)
LDL not quite at goal for diabetic.  Discussed dietary changes. No changes made to rxs today.

## 2016-03-19 NOTE — Assessment & Plan Note (Signed)
Cr stable 

## 2016-03-19 NOTE — Progress Notes (Signed)
Subjective:    Patient ID: Randy [name], male    DOB: 11/06/1945, 70 y.o.   MRN: [id number]  70 yo pleasant male here for CPX and follow up of chronic medical conditions with some concerns.  Annual Medicare wellness visit with Randy Musa, RN on 03/09/16- notes reviewed.  Chest congestion- feels like he is constantly clearing his throat.  Antihistamines help some.  No CP or SOB.  CHF with ICD- device interrogated 03/13/16.  DM- on glipizide 5 mg daily.  Checks fasting FSBS three times weekly- usually 120s-150s No recent h/o hypoglycemia. Lab Results  Component Value Date   HGBA1C 6.4 03/09/2016  On lisinopril 2.5 mg daily.  Wt Readings from Last 3 Encounters:  03/19/16 197 lb 8 oz (89.6 kg)  03/09/16 194 lb 8 oz (88.2 kg)  12/13/15 193 lb 8 oz (87.8 kg)   Gabapentin had been helping with his neuropathy until recently.  Now numbness and pain in his hands is worsening over past several months.  HLD- almost at goal for a diabetic.  Takes Mevacor 20 mg daily along with Niaspan.  Wt Readings from Last 3 Encounters:  03/19/16 197 lb 8 oz (89.6 kg)  03/09/16 194 lb 8 oz (88.2 kg)  12/13/15 193 lb 8 oz (87.8 kg)    Lab Results  Component Value Date   CREATININE 1.56 (H) 03/09/2016    Lab Results  Component Value Date   CHOL 180 03/09/2016   HDL 32.70 (L) 03/09/2016   LDLCALC 115 (H) 03/09/2016   LDLDIRECT 118.8 03/22/2014   TRIG 165.0 (H) 03/09/2016   CHOLHDL 6 03/09/2016    Lab Results  Component Value Date   PSA 1.01 03/09/2016   PSA 1.34 06/15/2014   PSA 1.15 01/06/2013    Current Outpatient Prescriptions on File Prior to Visit  Medication Sig Dispense Refill  . albuterol (PROVENTIL HFA;VENTOLIN HFA) 108 (90 Base) MCG/ACT inhaler Inhale 2 puffs into the lungs every 6 (six) hours as needed for wheezing or shortness of breath. 1 Inhaler 0  . aspirin EC 81 MG tablet Take 81 mg by mouth daily.    . carvedilol (COREG) 6.25 MG tablet TAKE ONE TABLET BY MOUTH  TWICE DAILY WITH A MEAL 60 tablet 3  . glipiZIDE (GLUCOTROL) 5 MG tablet TAKE ONE TABLET BY MOUTH DAILY BEFORE BREAKFAST 90 tablet 1  . lisinopril (PRINIVIL,ZESTRIL) 2.5 MG tablet TAKE ONE TABLET BY MOUTH ONCE DAILY 30 tablet 3  . lovastatin (MEVACOR) 20 MG tablet TAKE ONE TABLET BY MOUTH AT BEDTIME 90 tablet 0  . tamsulosin (FLOMAX) 0.4 MG CAPS capsule Take 1 capsule (0.4 mg total) by mouth daily. 90 capsule 2   No current facility-administered medications on file prior to visit.     No Known Allergies  Past Medical History:  Diagnosis Date  . Anemia   . Arthritis    hip, hands- Osteoarthritis    . CAD (coronary artery disease)   . CKD (chronic kidney disease) stage 3, GFR 30-59 ml/min    Randy [name]  . Dyslipidemia   . GERD (gastroesophageal reflux disease)   . Hypertension    Randy [name]  . ICD (implantable cardiac defibrillator) in place    2009-St. Jude device  . Neuromuscular disorder (HCC)    neuropathy- in hands  . Pacemaker   . Shortness of breath   . Sleep apnea    started a sleep study but didn't completed, states PCP told him he has sleep  apnea, doesn't use CPAP  .  Systolic dysfunction   . Type 2 diabetes with nephropathy ([place])   . Vitamin D deficiency     Past Surgical History:  Procedure Laterality Date  . BACK SURGERY     lumbar & cervical fusion   . INSERT / REPLACE / REMOVE PACEMAKER    . left carpal tunnel release    . left rotator cuff surgery    . LUMBAR LAMINECTOMY Left 07/17/2013   Procedure: MICRODISCECTOMY LUMBAR LAMINECTOMY;  Surgeon: Randy Killings, MD;  Location: [place];  Service: Orthopedics;  Laterality: Left;  Left L4 Laminotomy, Lateral Recess Decompression, Cyst Excision  . TOTAL HIP ARTHROPLASTY Left 09/17/2012   Dr Lorin Mercy  . TOTAL HIP ARTHROPLASTY Left 09/17/2012   Procedure: Left TOTAL HIP ARTHROPLASTY ANTERIOR APPROACH;  Surgeon: Randy Killings, MD;  Location: [facility];  Service: Orthopedics;  Laterality: Left;    Family History  Problem  Relation Age of Onset  . Heart attack Father 69  . Diabetes Brother     Social History   Social History  . Marital status: Single    Spouse name: N/A  . Number of children: 2  . Years of education: N/A   Occupational History  . 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 Smoking status: Former Smoker    Types: Cigarettes    Quit date: 07/30/1984  . Smokeless tobacco: Never Used  . Alcohol use Yes     Comment: occ  . Drug use: No  . Sexual activity: No   Other Topics Concern  . Not on file   Social History Narrative   Does not have a living will.   Desires CPR, does want life support if not futile.         The PMH, PSH, Social History, Family History, Medications, and allergies have been reviewed in [facility], and have been updated if relevant.      Review of Systems    Review of Systems  Constitutional: Negative.   HENT: Negative.   Eyes: Negative.   Respiratory: Positive for cough.   Cardiovascular: Negative.   Gastrointestinal: Negative.   Endocrine: Negative.   Genitourinary: Negative.   Musculoskeletal: Positive for arthralgias and back pain.  Skin: Negative.   Allergic/Immunologic: Negative.   Neurological: Negative.   Hematological: Negative.   Psychiatric/Behavioral: Negative.   All other systems reviewed and are negative.   Objective:   Physical Exam BP 130/62   Pulse (!) 57   Temp 98.1 F (36.7 C) (Oral)   Ht 5' 7.5" (1.715 m)   Wt 197 lb 8 oz (89.6 kg)   SpO2 96%   BMI 30.48 kg/m  General:  pleasant male in NAD Eyes:  PERRL Ears:  External ear exam shows no significant lesions or deformities.  Otoscopic examination reveals clear canals, tympanic membranes are intact bilaterally without bulging, retraction, inflammation or discharge. Hearing is grossly normal bilaterally. Nose:  External nasal examination shows no deformity or inflammation. Nasal mucosa are pink and moist without lesions or exudates. Mouth:  Oral  mucosa and oropharynx without lesions or exudates.  Teeth in good repair. Neck:  no carotid bruit or thyromegaly no cervical or supraclavicular lymphadenopathy  Lungs:  Normal respiratory effort, chest expands symmetrically. Lungs are clear to auscultation, no crackles or wheezes. Heart:  Normal rate and regular rhythm. S1 and S2 normal without gallop, murmur, click, rub or other extra sounds. Pulses:  R and L posterior tibial pulses are full and equal  bilaterally  Ext:  +enlarged DIPs and PIP with deformities bilaterally      Assessment & Plan:

## 2016-03-19 NOTE — Assessment & Plan Note (Signed)
a1c at goal. No changes made to rxs today. Immunizations UTD.

## 2016-03-19 NOTE — Patient Instructions (Signed)
Great to see you. We are increasing your gabapentin to 600 mg three times daily.  Keep me updated.  Try some mucinex for congestion.

## 2016-03-19 NOTE — Progress Notes (Signed)
Pre visit review using our clinic review tool, if applicable. No additional management support is needed unless otherwise documented below in the visit note. 

## 2016-03-19 NOTE — Assessment & Plan Note (Signed)
Reviewed preventive care protocols, scheduled due services, and updated immunizations Discussed nutrition, exercise, diet, and healthy lifestyle.  

## 2016-03-19 NOTE — Assessment & Plan Note (Signed)
Deteriorated. Increase Gabapentin to 600 mg three times daily.

## 2016-03-26 ENCOUNTER — Other Ambulatory Visit: Payer: Self-pay | Admitting: Family Medicine

## 2016-04-09 ENCOUNTER — Other Ambulatory Visit: Payer: Self-pay | Admitting: Family Medicine

## 2016-04-16 ENCOUNTER — Other Ambulatory Visit: Payer: Self-pay | Admitting: Internal Medicine

## 2016-04-19 NOTE — Telephone Encounter (Signed)
Last filled 09/2015 by [facility]--pt had CPE with you 02/2016--please advise if okay for pt to continue

## 2016-05-16 ENCOUNTER — Other Ambulatory Visit: Payer: Self-pay | Admitting: Family Medicine

## 2016-05-16 NOTE — Telephone Encounter (Signed)
Gabapentin last filled on 03/26/16 #90 + 1. Ok to refill?

## 2016-06-04 ENCOUNTER — Other Ambulatory Visit: Payer: Self-pay | Admitting: Family Medicine

## 2016-06-13 ENCOUNTER — Other Ambulatory Visit: Payer: Self-pay

## 2016-06-13 ENCOUNTER — Encounter: Payer: Self-pay | Admitting: Internal Medicine

## 2016-06-13 ENCOUNTER — Ambulatory Visit (INDEPENDENT_AMBULATORY_CARE_PROVIDER_SITE_OTHER): Payer: Medicare Other

## 2016-06-13 ENCOUNTER — Ambulatory Visit (INDEPENDENT_AMBULATORY_CARE_PROVIDER_SITE_OTHER): Payer: Medicare Other | Admitting: Internal Medicine

## 2016-06-13 VITALS — BP 106/68 | HR 61 | Ht 68.0 in | Wt 197.8 lb

## 2016-06-13 DIAGNOSIS — Z95810 Presence of automatic (implantable) cardiac defibrillator: Secondary | ICD-10-CM

## 2016-06-13 DIAGNOSIS — Z23 Encounter for immunization: Secondary | ICD-10-CM | POA: Diagnosis not present

## 2016-06-13 DIAGNOSIS — I5022 Chronic systolic (congestive) heart failure: Secondary | ICD-10-CM | POA: Diagnosis not present

## 2016-06-13 MED ORDER — FUROSEMIDE 20 MG PO TABS: 20.0000 mg | ORAL_TABLET | Freq: Every day | ORAL | 6 refills | Status: DC

## 2016-06-13 NOTE — Progress Notes (Signed)
HPI Randy [name] returns today for followup. He is a pleasant 70 yo man with a h/o HTN, chronic systolic CHF, s/p ICD implant. In the interim he has been stable. He has chronic exertional dyspnea which is class II. He denies chest pain or syncope. No ICD shocks. He is playing golf and still working Training and development officer.  No Known Allergies   Current Outpatient Prescriptions  Medication Sig Dispense Refill  . albuterol (PROVENTIL HFA;VENTOLIN HFA) 108 (90 Base) MCG/ACT inhaler Inhale 2 puffs into the lungs every 6 (six) hours as needed for wheezing or shortness of breath. 1 Inhaler 0  . aspirin EC 81 MG tablet Take 81 mg by mouth daily.    . carvedilol (COREG) 6.25 MG tablet TAKE ONE TABLET BY MOUTH TWICE DAILY WITH A MEAL 60 tablet 3  . gabapentin (NEURONTIN) 300 MG capsule Take 600 mg by mouth 2 (two) times daily.    Marland Kitchen glipiZIDE (GLUCOTROL) 5 MG tablet TAKE ONE TABLET BY MOUTH DAILY BEFORE BREAKFAST 90 tablet 1  . lisinopril (PRINIVIL,ZESTRIL) 2.5 MG tablet TAKE ONE TABLET BY MOUTH ONCE DAILY 90 tablet 2  . lovastatin (MEVACOR) 20 MG tablet TAKE ONE TABLET BY MOUTH AT BEDTIME 90 tablet 0  . meloxicam (MOBIC) 15 MG tablet Take 1 tablet by mouth daily.    . tamsulosin (FLOMAX) 0.4 MG CAPS capsule TAKE ONE CAPSULE BY MOUTH DAILY 90 capsule 2   No current facility-administered medications for this visit.      Past Medical History:  Diagnosis Date  . Anemia   . Arthritis    hip, hands- Osteoarthritis    . CAD (coronary artery disease)   . CKD (chronic kidney disease) stage 3, GFR 30-59 ml/min    Randy [name]  . Dyslipidemia   . GERD (gastroesophageal reflux disease)   . Hypertension    Randy [name]  . ICD (implantable cardiac defibrillator) in place    2009-Randy [name] device  . Neuromuscular disorder (HCC)    neuropathy- in hands  . Pacemaker   . Shortness of breath   . Sleep apnea    started a sleep study but didn't completed, states PCP told him he has sleep  apnea, doesn't use  CPAP  . Systolic dysfunction   . Type 2 diabetes with nephropathy (Randy [name])   . Vitamin D deficiency     ROS:   All systems reviewed and negative except as noted in the HPI.   Past Surgical History:  Procedure Laterality Date  . BACK SURGERY     lumbar & cervical fusion   . INSERT / REPLACE / REMOVE PACEMAKER    . left carpal tunnel release    . left rotator cuff surgery    . LUMBAR LAMINECTOMY Left 07/17/2013   Procedure: MICRODISCECTOMY LUMBAR LAMINECTOMY;  Surgeon: Randy Killings, MD;  Location: [facility];  Service: Orthopedics;  Laterality: Left;  Left L4 Laminotomy, Lateral Recess Decompression, Cyst Excision  . TOTAL HIP ARTHROPLASTY Left 09/17/2012   Dr Randy [name]  . TOTAL HIP ARTHROPLASTY Left 09/17/2012   Procedure: Left TOTAL HIP ARTHROPLASTY ANTERIOR APPROACH;  Surgeon: Randy Killings, MD;  Location: [facility];  Service: Orthopedics;  Laterality: Left;     Family History  Problem Relation Age of Onset  . Heart attack Father 47  . Diabetes Brother      Social History   Social History  . Marital status: Single    Spouse name: N/A  . Number of children: 2  .  Years of education: N/A   Occupational History  . 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 Smoking status: Former Smoker    Types: Cigarettes    Quit date: 07/30/1984  . Smokeless tobacco: Never Used  . Alcohol use Yes     Comment: occ  . Drug use: No  . Sexual activity: No   Other Topics Concern  . Not on file   Social History Narrative   Does not have a living will.   Desires CPR, does want life support if not futile.           BP 106/68   Pulse 61   Ht 5\' 8"  (1.727 m)   Wt 197 lb 12.8 oz (89.7 kg)   BMI 30.08 kg/m  Physical Exam:  Well appearing 70 year old man,NAD HEENT: Unremarkable Neck:  6 cm JVD, no thyromegally Back:  No CVA tenderness Lungs:  Clear with no wheezes, rales, or rhonchi. HEART:  Regular rate rhythm, no murmurs, no rubs, no clicks Abd:  soft, positive  bowel sounds, no organomegally, no rebound, no guarding Ext:  2 plus pulses, no edema, no cyanosis, no clubbing Skin:  No rashes no nodules Neuro:  CN II through XII intact, motor grossly intact  DEVICE  Normal device function.  See PaceArt for details. 1.5 years from ERI  Assess/Plan: 1. Chronic systolic heart failure - the patient has had some sob. I have asked him to try low dose lasix. If his dyspnea is improved, then we will continue. If no improvement then he will stop and undergo watchful waiting. I encouraged him to avoid sodium. 2. Angina/CAD - he denies anginal symptoms. Will follow. 3. ICD - his Randy [name] single chamber ICD is working normally. Will follow. 4. Hypertensive heart disease - his blood pressure is now well controlled. Will follow.  Randy [name].D.

## 2016-06-13 NOTE — Patient Instructions (Addendum)
Medication Instructions:  Your physician has recommended you make the following change in your medication:  START Lasix 20 mg daily    Labwork: None Ordered    Testing/Procedures: None Ordered   Follow-Up: Your physician wants you to follow-up in: 1 year with Dr. Lovena Le.  You will receive a reminder letter in the mail two months in advance. If you don't receive a letter, please call our office to schedule the follow-up appointment.  Remote monitoring is used to monitor your Pacemaker of ICD from home. This monitoring reduces the number of office visits required to check your device to one time per year. It allows [place] to keep an eye on the functioning of your device to ensure it is working properly. You are scheduled for a device check from home on 09/12/16. You may send your transmission at any time that day. If you have a wireless device, the transmission will be sent automatically. After your physician reviews your transmission, you will receive a postcard with your next transmission date.    Any Other Special Instructions Will Be Listed Below (If Applicable). Call our office to let us know if your breathing is better. If breathing is better, we need to schedule lab work (BMP), and continue the Lasix. If breathing is not better, stop Lasix.    If you need a refill on your cardiac medications before your next appointment, please call your pharmacy.

## 2016-07-24 ENCOUNTER — Other Ambulatory Visit: Payer: Self-pay | Admitting: *Deleted

## 2016-07-24 MED ORDER — ACCU-CHEK AVIVA VI SOLN: 2 refills | Status: DC

## 2016-07-24 MED ORDER — LISINOPRIL 2.5 MG PO TABS: 2.5000 mg | ORAL_TABLET | Freq: Every day | ORAL | 2 refills | Status: DC

## 2016-07-24 MED ORDER — GLIPIZIDE 5 MG PO TABS: ORAL_TABLET | ORAL | 2 refills | Status: DC

## 2016-07-24 MED ORDER — LOVASTATIN 20 MG PO TABS: 20.0000 mg | ORAL_TABLET | Freq: Every day | ORAL | 2 refills | Status: DC

## 2016-07-24 MED ORDER — ACCU-CHEK SOFTCLIX LANCETS MISC: 3 refills | Status: DC

## 2016-07-24 MED ORDER — GABAPENTIN 300 MG PO CAPS: 600.0000 mg | ORAL_CAPSULE | Freq: Two times a day (BID) | ORAL | 0 refills | Status: DC

## 2016-07-24 MED ORDER — BD SWAB SINGLE USE REGULAR PADS: MEDICATED_PAD | 3 refills | Status: AC

## 2016-07-24 MED ORDER — CARVEDILOL 6.25 MG PO TABS: 6.2500 mg | ORAL_TABLET | Freq: Two times a day (BID) | ORAL | 2 refills | Status: DC

## 2016-07-24 MED ORDER — TAMSULOSIN HCL 0.4 MG PO CAPS: 0.4000 mg | ORAL_CAPSULE | Freq: Every day | ORAL | 2 refills | Status: DC

## 2016-07-24 NOTE — Telephone Encounter (Signed)
[facility] (90 day supply with ? Refills).  Last Filled:  By Dr. Cristopher Peru  Last office visit:    CPE 03/19/16   Please advise.

## 2016-07-24 NOTE — Telephone Encounter (Signed)
done

## 2016-07-24 NOTE — Telephone Encounter (Signed)
Please refill times one in PCP absence  Thanks  

## 2016-07-31 ENCOUNTER — Telehealth: Payer: Self-pay | Admitting: *Deleted

## 2016-07-31 NOTE — Telephone Encounter (Signed)
Pt called because his insurance changed.  He said some meds were called into [employer], the new ins. Company but two of the meds he takes were not called into [facility].  He said that Miloxicam and Glyposide.  Can you please call these in too if appropriate.  He says we need to contact [facility] but didn't leave a number.  Please call him.

## 2016-07-31 NOTE — Telephone Encounter (Signed)
Fax received requesting refills. Encounter opened in error. All Rx sent to requested pharmacy

## 2016-07-31 NOTE — Telephone Encounter (Signed)
Spoke to pt and advised that glipizide was sent to pharmacy on 12/26. The meloxicam was prescribed by cardiology and pt was advised he will have to contact them directly for a refill

## 2016-08-03 ENCOUNTER — Other Ambulatory Visit: Payer: Self-pay | Admitting: *Deleted

## 2016-08-03 MED ORDER — FUROSEMIDE 20 MG PO TABS: 20.0000 mg | ORAL_TABLET | Freq: Every day | ORAL | 3 refills | Status: DC

## 2016-08-03 NOTE — Addendum Note (Signed)
Addended by: Juventino Slovak on: 08/03/2016 03:31 PM   Modules accepted: Orders

## 2016-09-10 ENCOUNTER — Encounter: Payer: Self-pay | Admitting: Family Medicine

## 2016-09-10 ENCOUNTER — Ambulatory Visit (INDEPENDENT_AMBULATORY_CARE_PROVIDER_SITE_OTHER): Payer: Medicare HMO | Admitting: Family Medicine

## 2016-09-10 VITALS — BP 126/66 | HR 56 | Temp 97.9°F | Wt 200.0 lb

## 2016-09-10 DIAGNOSIS — M549 Dorsalgia, unspecified: Secondary | ICD-10-CM | POA: Diagnosis not present

## 2016-09-10 LAB — POC URINALSYSI DIPSTICK (AUTOMATED)
Bilirubin, UA: NEGATIVE
Blood, UA: NEGATIVE
Glucose, UA: NEGATIVE
KETONES UA: NEGATIVE
Leukocytes, UA: NEGATIVE
Nitrite, UA: NEGATIVE
PH UA: 6
Protein, UA: NEGATIVE
SPEC GRAV UA: 1.02
UROBILINOGEN UA: 0.2

## 2016-09-10 NOTE — Progress Notes (Signed)
Pre visit review using our clinic review tool, if applicable. No additional management support is needed unless otherwise documented below in the visit note. 

## 2016-09-10 NOTE — Progress Notes (Signed)
SUBJECTIVE:  ROWNAN [name] is a 71 y.o. male who complains of low back pain for 3 week(s), positional with bending or lifting, without radiation down the legs. Precipitating factors: none recalled by the patient. Prior history of back problems: previous spinal surgery - over 15 years ago. There is not numbness in the legs.  Over the weekend, took mobic and tylenol and symptoms are improving.  He wanted to come in today to check a urine sample.  No dysuria or hematuria but he wants to make sure it is not a kidney stone or an infection.  Current Outpatient Prescriptions on File Prior to Visit  Medication Sig Dispense Refill  . ACCU-CHEK SOFTCLIX LANCETS lancets Use as instructed to check blood sugar once daily and as needed.  Diagnosis:  E11.21  Non- insulin dependent. 100 each 3  . albuterol (PROVENTIL HFA;VENTOLIN HFA) 108 (90 Base) MCG/ACT inhaler Inhale 2 puffs into the lungs every 6 (six) hours as needed for wheezing or shortness of breath. 1 Inhaler 0  . Alcohol Swabs (B-D SINGLE USE SWABS REGULAR) PADS Use as instructed to clean area for glucose monitoring once daily and as needed.  Diagnosis:  E11.21  Non insulin-dependent 100 each 3  . aspirin EC 81 MG tablet Take 81 mg by mouth daily.    . Blood Glucose Calibration (ACCU-CHEK AVIVA) SOLN Use to calibrate blood glucose machine as recommended.  Diagnosis:  E11.21  Non-insulin dependent. 1 each 2  . carvedilol (COREG) 6.25 MG tablet Take 1 tablet (6.25 mg total) by mouth 2 (two) times daily with a meal. 180 tablet 2  . furosemide (LASIX) 20 MG tablet Take 1 tablet (20 mg total) by mouth daily. 90 tablet 3  . gabapentin (NEURONTIN) 300 MG capsule Take 2 capsules (600 mg total) by mouth 2 (two) times daily. 360 capsule 0  . glipiZIDE (GLUCOTROL) 5 MG tablet TAKE ONE TABLET BY MOUTH DAILY BEFORE BREAKFAST 90 tablet 2  . lisinopril (PRINIVIL,ZESTRIL) 2.5 MG tablet Take 1 tablet (2.5 mg total) by mouth daily. 90 tablet 2  . lovastatin (MEVACOR) 20  MG tablet Take 1 tablet (20 mg total) by mouth at bedtime. 90 tablet 2  . meloxicam (MOBIC) 15 MG tablet Take 1 tablet by mouth daily.    . tamsulosin (FLOMAX) 0.4 MG CAPS capsule Take 1 capsule (0.4 mg total) by mouth daily. 90 capsule 2   No current facility-administered medications on file prior to visit.     No Known Allergies  Past Medical History:  Diagnosis Date  . Anemia   . Arthritis    hip, hands- Osteoarthritis    . CAD (coronary artery disease)   . CKD (chronic kidney disease) stage 3, GFR 30-59 ml/min    Randy [name]  . Dyslipidemia   . GERD (gastroesophageal reflux disease)   . Hypertension    Randy [name]  . ICD (implantable cardiac defibrillator) in place    2009-St. Jude device  . Neuromuscular disorder (HCC)    neuropathy- in hands  . Pacemaker   . Shortness of breath   . Sleep apnea    started a sleep study but didn't completed, states PCP told him he has sleep  apnea, doesn't use CPAP  . Systolic dysfunction   . Type 2 diabetes with nephropathy ([place])   . Vitamin D deficiency     Past Surgical History:  Procedure Laterality Date  . BACK SURGERY     lumbar & cervical fusion   . INSERT / REPLACE /  REMOVE PACEMAKER    . left carpal tunnel release    . left rotator cuff surgery    . LUMBAR LAMINECTOMY Left 07/17/2013   Procedure: MICRODISCECTOMY LUMBAR LAMINECTOMY;  Surgeon: Randy Killings, MD;  Location: [facility];  Service: Orthopedics;  Laterality: Left;  Left L4 Laminotomy, Lateral Recess Decompression, Cyst Excision  . TOTAL HIP ARTHROPLASTY Left 09/17/2012   Dr Randy [name]  . TOTAL HIP ARTHROPLASTY Left 09/17/2012   Procedure: Left TOTAL HIP ARTHROPLASTY ANTERIOR APPROACH;  Surgeon: Randy Killings, MD;  Location: [facility];  Service: Orthopedics;  Laterality: Left;    Family History  Problem Relation Age of Onset  . Heart attack Father 24  . Diabetes Brother     Social History   Social History  . Marital status: Single    Spouse name: N/A  . Number of children: 2   . Years of education: N/A   Occupational History  . 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 Smoking status: Former Smoker    Types: Cigarettes    Quit date: 07/30/1984  . Smokeless tobacco: Never Used  . Alcohol use Yes     Comment: occ  . Drug use: No  . Sexual activity: No   Other Topics Concern  . Not on file   Social History Narrative   Does not have a living will.   Desires CPR, does want life support if not futile.         The PMH, PSH, Social History, Family History, Medications, and allergies have been reviewed in [facility], and have been updated if relevant.  OBJECTIVE: BP 126/66   Pulse (!) 56   Temp 97.9 F (36.6 C) (Oral)   Wt 200 lb (90.7 kg)   SpO2 95%   BMI 30.41 kg/m   BP 126/66   Pulse (!) 56   Temp 97.9 F (36.6 C) (Oral)   Wt 200 lb (90.7 kg)   SpO2 95%   BMI 30.41 kg/m   Patient appears to be in mild to moderate pain, antalgic gait noted. Lumbosacral spine area reveals no local tenderness or mass.  Painful and reduced LS ROM noted. Straight leg raise is negative bilaterally.  DTR's, motor strength and sensation normal, including heel and toe gait.  Peripheral pulses are palpable. X-Ray: not indicated.  ASSESSMENT:  osteoarthritis of lumbo-sacral spine and without radiculopathy  PLAN: UA neg- reassurance provided. For acute pain, rest, intermittent application of heat (do not sleep on heating pad), analgesics and muscle relaxants are recommended. Discussed longer term treatment plan of prn NSAID's and discussed a home back care exercise program with flexion exercise routine. Proper lifting with avoidance of heavy lifting discussed. Consider Physical Therapy and XRay studies if not improving. Call or return to clinic prn if these symptoms worsen or fail to improve as anticipated.

## 2016-09-12 ENCOUNTER — Ambulatory Visit (INDEPENDENT_AMBULATORY_CARE_PROVIDER_SITE_OTHER): Payer: Medicare HMO | Admitting: *Deleted

## 2016-09-12 DIAGNOSIS — I5022 Chronic systolic (congestive) heart failure: Secondary | ICD-10-CM

## 2016-09-12 DIAGNOSIS — Z95810 Presence of automatic (implantable) cardiac defibrillator: Secondary | ICD-10-CM

## 2016-09-12 NOTE — Progress Notes (Signed)
Remote ICD transmission.   

## 2016-09-13 ENCOUNTER — Encounter: Payer: Self-pay | Admitting: Cardiology

## 2016-09-21 LAB — CUP PACEART REMOTE DEVICE CHECK
Battery Voltage: 2.5 V
Brady Statistic RV Percent Paced: 1 %
HIGH POWER IMPEDANCE MEASURED VALUE: 71 Ohm
HighPow Impedance: 71 Ohm
Implantable Lead Implant Date: 20091022
Implantable Lead Location: 753860
Lead Channel Impedance Value: 460 Ohm
Lead Channel Pacing Threshold Amplitude: 1.5 V
Lead Channel Pacing Threshold Pulse Width: 0.8 ms
Lead Channel Sensing Intrinsic Amplitude: 6.3 mV
MDC IDC MSMT BATTERY REMAINING LONGEVITY: 5 mo
MDC IDC PG IMPLANT DT: 20091022
MDC IDC SESS DTM: 20180214070009
MDC IDC SET LEADCHNL RV PACING AMPLITUDE: 2.5 V
MDC IDC SET LEADCHNL RV PACING PULSEWIDTH: 0.8 ms
MDC IDC SET LEADCHNL RV SENSING SENSITIVITY: 0.3 mV
Pulse Gen Serial Number: 663191

## 2016-09-24 ENCOUNTER — Other Ambulatory Visit: Payer: Self-pay

## 2016-09-24 NOTE — Telephone Encounter (Signed)
Pt left v/m requesting refill meloxicam to  [facility].pt last seen 09/10/16.

## 2016-09-25 ENCOUNTER — Other Ambulatory Visit: Payer: Self-pay | Admitting: *Deleted

## 2016-09-25 MED ORDER — MELOXICAM 15 MG PO TABS: 15.0000 mg | ORAL_TABLET | Freq: Every day | ORAL | 0 refills | Status: DC

## 2016-09-25 MED ORDER — GLUCOSE BLOOD VI STRP: ORAL_STRIP | 3 refills | Status: DC

## 2016-09-25 MED ORDER — GABAPENTIN 300 MG PO CAPS: 600.0000 mg | ORAL_CAPSULE | Freq: Two times a day (BID) | ORAL | 0 refills | Status: DC

## 2016-09-25 MED ORDER — ACCU-CHEK AVIVA DEVI: 0 refills | Status: AC

## 2016-09-25 NOTE — Telephone Encounter (Signed)
Last office visit 09/10/2016.  Last refilled 07/24/2016 for #[id number] with no refills.  Ok to refill?

## 2016-10-01 ENCOUNTER — Telehealth: Payer: Self-pay | Admitting: Cardiology

## 2016-10-01 NOTE — Telephone Encounter (Signed)
LMOVM for pt to return call. (device reached ERI on 09-29-16)

## 2016-10-02 NOTE — Telephone Encounter (Signed)
Spoke w/ pt and informed him that his device has reached ERI and that a scheduler will call to schedule an appt w/ MD / NP / PA to come into the office and discuss the procedure, and procedure will be scheduled for another day. Pt verbalized understanding.

## 2016-10-03 NOTE — Progress Notes (Signed)
Electrophysiology Office Note Date: 10/04/2016  ID:  MISHAEL HARAN, DOB November 25, 1945, MRN [id number]  PCP: Arnette Norris, MD Electrophysiologist: Lovena Le  CC: ICD at [facility] is a 71 y.o. male seen today for Dr Lovena Le.  He presents today for routine electrophysiology followup.  Since last being seen in our clinic, the patient reports doing very well.  He continues to work but has fatigue and shortness of breath with exertion.  He denies chest pain, palpitations, dyspnea, PND, orthopnea, nausea, vomiting, dizziness, syncope, edema, weight gain, or early satiety.  He has not had ICD shocks.   Device History: STJ single chamber ICD implanted 2009 for ICM History of appropriate therapy: No History of AAD therapy: No   Past Medical History:  Diagnosis Date  . Anemia   . Arthritis    hip, hands- Osteoarthritis    . CAD (coronary artery disease)   . CKD (chronic kidney disease) stage 3, GFR 30-59 ml/min    [name]  . Dyslipidemia   . GERD (gastroesophageal reflux disease)   . Hypertension    g. taylor  . ICD (implantable cardiac defibrillator) in place    2009-St. Jude device  . Neuromuscular disorder (HCC)    neuropathy- in hands  . Pacemaker   . Shortness of breath   . Sleep apnea    started a sleep study but didn't completed, states PCP told him he has sleep  apnea, doesn't use CPAP  . Systolic dysfunction   . Type 2 diabetes with nephropathy ([place])   . Vitamin D deficiency    Past Surgical History:  Procedure Laterality Date  . BACK SURGERY     lumbar & cervical fusion   . INSERT / REPLACE / REMOVE PACEMAKER    . left carpal tunnel release    . left rotator cuff surgery    . LUMBAR LAMINECTOMY Left 07/17/2013   Procedure: MICRODISCECTOMY LUMBAR LAMINECTOMY;  Surgeon: Marybelle Killings, MD;  Location: [facility];  Service: Orthopedics;  Laterality: Left;  Left L4 Laminotomy, Lateral Recess Decompression, Cyst Excision  . TOTAL HIP ARTHROPLASTY Left 09/17/2012   Dr Lorin Mercy   . TOTAL HIP ARTHROPLASTY Left 09/17/2012   Procedure: Left TOTAL HIP ARTHROPLASTY ANTERIOR APPROACH;  Surgeon: Marybelle Killings, MD;  Location: [facility];  Service: Orthopedics;  Laterality: Left;    Current Outpatient Prescriptions  Medication Sig Dispense Refill  . ACCU-CHEK SOFTCLIX LANCETS lancets Use as instructed to check blood sugar once daily and as needed.  Diagnosis:  E11.21  Non- insulin dependent. 100 each 3  . albuterol (PROVENTIL HFA;VENTOLIN HFA) 108 (90 Base) MCG/ACT inhaler Inhale 2 puffs into the lungs every 6 (six) hours as needed for wheezing or shortness of breath. 1 Inhaler 0  . Alcohol Swabs (B-D SINGLE USE SWABS REGULAR) PADS Use as instructed to clean area for glucose monitoring once daily and as needed.  Diagnosis:  E11.21  Non insulin-dependent 100 each 3  . aspirin EC 81 MG tablet Take 81 mg by mouth daily.    . Blood Glucose Calibration (ACCU-CHEK AVIVA) SOLN Use to calibrate blood glucose machine as recommended.  Diagnosis:  E11.21  Non-insulin dependent. 1 each 2  . Blood Glucose Monitoring Suppl (ACCU-CHEK AVIVA) device Use to check blood sugar once daily.  Dx: E11.21 1 each 0  . carvedilol (COREG) 6.25 MG tablet Take 1 tablet (6.25 mg total) by mouth 2 (two) times daily with a meal. 180 tablet 2  . furosemide (LASIX)  20 MG tablet Take 1 tablet (20 mg total) by mouth daily. 90 tablet 3  . gabapentin (NEURONTIN) 300 MG capsule Take 2 capsules (600 mg total) by mouth 2 (two) times daily. 360 capsule 0  . glipiZIDE (GLUCOTROL) 5 MG tablet TAKE ONE TABLET BY MOUTH DAILY BEFORE BREAKFAST 90 tablet 2  . glucose blood (ACCU-CHEK AVIVA) test strip Use to check blood sugar once daily.  DX: E11.21 100 each 3  . lisinopril (PRINIVIL,ZESTRIL) 2.5 MG tablet Take 1 tablet (2.5 mg total) by mouth daily. 90 tablet 2  . lovastatin (MEVACOR) 20 MG tablet Take 1 tablet (20 mg total) by mouth at bedtime. 90 tablet 2  . meloxicam (MOBIC) 15 MG tablet Take 1 tablet (15 mg total) by mouth daily.  90 tablet 0  . tamsulosin (FLOMAX) 0.4 MG CAPS capsule Take 1 capsule (0.4 mg total) by mouth daily. 90 capsule 2   No current facility-administered medications for this visit.     Allergies:   Patient has no known allergies.   Social History: Social History   Social History  . Marital status: Single    Spouse name: N/A  . Number of children: 2  . Years of education: N/A   Occupational History  . 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 Smoking status: Former Smoker    Types: Cigarettes    Quit date: 07/30/1984  . Smokeless tobacco: Never Used  . Alcohol use Yes     Comment: occ  . Drug use: No  . Sexual activity: No   Other Topics Concern  . Not on file   Social History Narrative   Does not have a living will.   Desires CPR, does want life support if not futile.          Family History: Family History  Problem Relation Age of Onset  . Heart attack Father 72  . Diabetes Brother     Review of Systems: All other systems reviewed and are otherwise negative except as noted above.   Physical Exam: VS:  BP 110/60   Pulse (!) 56   Ht 5\' 8"  (1.727 m)   Wt 198 lb (89.8 kg)   BMI 30.11 kg/m  , BMI Body mass index is 30.11 kg/m.  GEN- The patient is well appearing, alert and oriented x 3 today.   HEENT: normocephalic, atraumatic; sclera clear, conjunctiva pink; hearing intact; oropharynx clear; neck supple  Lungs- Clear to ausculation bilaterally, normal work of breathing.  No wheezes, rales, rhonchi Heart- Regular rate and rhythm  GI- soft, non-tender, non-distended, bowel sounds present  Extremities- no clubbing, cyanosis, or edema  MS- no significant deformity or atrophy Skin- warm and dry, no rash or lesion; ICD pocket well healed Psych- euthymic mood, full affect Neuro- strength and sensation are intact  ICD interrogation- reviewed in detail today,  See PACEART report  EKG:  EKG is ordered today. The ekg ordered today shows  sinus bradycardia, rate 56  Recent Labs: 03/09/2016: ALT 14; BUN 35; Creatinine, Ser 1.56; Potassium 5.3; Sodium 138; TSH 1.11   Wt Readings from Last 3 Encounters:  10/04/16 198 lb (89.8 kg)  09/10/16 200 lb (90.7 kg)  06/13/16 197 lb 12.8 oz (89.7 kg)     Other studies Reviewed: Additional studies/ records that were reviewed today include: Dr Tanna Furry office notes  Assessment and Plan:  1.  Chronic systolic dysfunction euvolemic today Stable on an appropriate medical regimen ICD at  ERI Will update echo The patient is clear in his decision to proceed with ICD gen change. Given sinus bradycardia as well as exercise intolerance and fatigue, it would be reasonable to plan upgrade to dual chamber device at time of implant.  He will discuss further with Dr Lovena Le day of procedure.  2.  CAD No recent ischemic symptoms Continue medical therapy  3.  HTN Stable No change required today  Current medicines are reviewed at length with the patient today.   The patient does not have concerns regarding his medicines.  The following changes were made today:  none  Labs/ tests ordered today include: echo Orders Placed This Encounter  Procedures  . CBC  . Basic metabolic panel  . EKG 12-Lead  . ECHOCARDIOGRAM COMPLETE     Follow up with Dr Lovena Le after upgrade    Signed, Chanetta Marshall, NP 10/04/2016 9:55 AM  [place]  [id number] [phone] (office) [phone] (fax)

## 2016-10-04 ENCOUNTER — Encounter: Payer: Self-pay | Admitting: Nurse Practitioner

## 2016-10-04 ENCOUNTER — Encounter: Payer: Self-pay | Admitting: *Deleted

## 2016-10-04 ENCOUNTER — Ambulatory Visit (INDEPENDENT_AMBULATORY_CARE_PROVIDER_SITE_OTHER): Payer: Medicare HMO | Admitting: Nurse Practitioner

## 2016-10-04 VITALS — BP 110/60 | HR 56 | Ht 68.0 in | Wt 198.0 lb

## 2016-10-04 DIAGNOSIS — I10 Essential (primary) hypertension: Secondary | ICD-10-CM | POA: Diagnosis not present

## 2016-10-04 DIAGNOSIS — I255 Ischemic cardiomyopathy: Secondary | ICD-10-CM

## 2016-10-04 DIAGNOSIS — I5022 Chronic systolic (congestive) heart failure: Secondary | ICD-10-CM

## 2016-10-04 NOTE — Patient Instructions (Signed)
Medication Instructions:   Your physician recommends that you continue on your current medications as directed. Please refer to the Current Medication list given to you today.   If you need a refill on your cardiac medications before your next appointment, please call your pharmacy.  Labwork:  CBC AND BMET  TODAY    Testing/Procedures: NEXT WEEK Your physician has requested that you have an echocardiogram. Echocardiography is a painless test that uses sound waves to create images of your heart. It provides your doctor with information about the size and shape of your heart and how well your heart's chambers and valves are working. This procedure takes approximately one hour. There are no restrictions for this procedure.   SEE  LETTER FOR DEVICE CHANGE  INSTRUCTIONS     Follow-Up: AFTER 10-18-2016   10 DAY WOUND   CHECK  WITH DEVICE CLINIC    [place] WITH DR  Lovena Le       Any Other Special Instructions Will Be Listed Below (If Applicable).

## 2016-10-11 ENCOUNTER — Ambulatory Visit (HOSPITAL_COMMUNITY)
Admission: RE | Admit: 2016-10-11 | Discharge: 2016-10-11 | Disposition: A | Discharge status: Home / Self Care | Payer: Medicare HMO | Source: Ambulatory Visit | Attending: Nurse Practitioner | Admitting: Nurse Practitioner

## 2016-10-11 DIAGNOSIS — I255 Ischemic cardiomyopathy: Secondary | ICD-10-CM | POA: Insufficient documentation

## 2016-10-11 NOTE — Progress Notes (Signed)
  Echocardiogram 2D Echocardiogram has been performed.  Randy [name] M 10/11/2016, 9:43 AM

## 2016-10-12 ENCOUNTER — Telehealth: Payer: Self-pay | Admitting: Internal Medicine

## 2016-10-12 NOTE — Telephone Encounter (Signed)
Informed patient's friend, Cecile Sheerer, per DPR of stable echo results.

## 2016-10-12 NOTE — Telephone Encounter (Signed)
New message    Pt is calling returning call to [facility] about results.

## 2016-10-18 ENCOUNTER — Encounter (HOSPITAL_COMMUNITY): Payer: Self-pay | Admitting: Internal Medicine

## 2016-10-18 ENCOUNTER — Ambulatory Visit (HOSPITAL_COMMUNITY)
Admission: RE | Disposition: A | Discharge status: Home / Self Care | Payer: Self-pay | Source: Ambulatory Visit | Attending: Internal Medicine

## 2016-10-18 ENCOUNTER — Ambulatory Visit (HOSPITAL_COMMUNITY)
Admission: RE | Admit: 2016-10-18 | Discharge: 2016-10-19 | Disposition: A | Discharge status: Home / Self Care | Payer: Medicare HMO | Source: Ambulatory Visit | Attending: Internal Medicine | Admitting: Internal Medicine

## 2016-10-18 DIAGNOSIS — I255 Ischemic cardiomyopathy: Secondary | ICD-10-CM | POA: Diagnosis not present

## 2016-10-18 DIAGNOSIS — Z96642 Presence of left artificial hip joint: Secondary | ICD-10-CM | POA: Diagnosis not present

## 2016-10-18 DIAGNOSIS — I130 Hypertensive heart and chronic kidney disease with heart failure and stage 1 through stage 4 chronic kidney disease, or unspecified chronic kidney disease: Secondary | ICD-10-CM | POA: Diagnosis not present

## 2016-10-18 DIAGNOSIS — M1990 Unspecified osteoarthritis, unspecified site: Secondary | ICD-10-CM | POA: Insufficient documentation

## 2016-10-18 DIAGNOSIS — I5022 Chronic systolic (congestive) heart failure: Secondary | ICD-10-CM | POA: Diagnosis present

## 2016-10-18 DIAGNOSIS — K219 Gastro-esophageal reflux disease without esophagitis: Secondary | ICD-10-CM | POA: Diagnosis not present

## 2016-10-18 DIAGNOSIS — N183 Chronic kidney disease, stage 3 (moderate): Secondary | ICD-10-CM | POA: Diagnosis not present

## 2016-10-18 DIAGNOSIS — G4730 Sleep apnea, unspecified: Secondary | ICD-10-CM | POA: Diagnosis not present

## 2016-10-18 DIAGNOSIS — E559 Vitamin D deficiency, unspecified: Secondary | ICD-10-CM | POA: Diagnosis not present

## 2016-10-18 DIAGNOSIS — E785 Hyperlipidemia, unspecified: Secondary | ICD-10-CM | POA: Insufficient documentation

## 2016-10-18 DIAGNOSIS — D631 Anemia in chronic kidney disease: Secondary | ICD-10-CM | POA: Diagnosis not present

## 2016-10-18 DIAGNOSIS — Z87891 Personal history of nicotine dependence: Secondary | ICD-10-CM | POA: Diagnosis not present

## 2016-10-18 DIAGNOSIS — I428 Other cardiomyopathies: Secondary | ICD-10-CM | POA: Diagnosis not present

## 2016-10-18 DIAGNOSIS — I2510 Atherosclerotic heart disease of native coronary artery without angina pectoris: Secondary | ICD-10-CM | POA: Diagnosis not present

## 2016-10-18 DIAGNOSIS — Z7951 Long term (current) use of inhaled steroids: Secondary | ICD-10-CM | POA: Insufficient documentation

## 2016-10-18 DIAGNOSIS — Z7982 Long term (current) use of aspirin: Secondary | ICD-10-CM | POA: Insufficient documentation

## 2016-10-18 DIAGNOSIS — Z833 Family history of diabetes mellitus: Secondary | ICD-10-CM | POA: Diagnosis not present

## 2016-10-18 DIAGNOSIS — E1122 Type 2 diabetes mellitus with diabetic chronic kidney disease: Secondary | ICD-10-CM | POA: Diagnosis not present

## 2016-10-18 DIAGNOSIS — E1121 Type 2 diabetes mellitus with diabetic nephropathy: Secondary | ICD-10-CM | POA: Diagnosis not present

## 2016-10-18 DIAGNOSIS — Z4502 Encounter for adjustment and management of automatic implantable cardiac defibrillator: Secondary | ICD-10-CM | POA: Insufficient documentation

## 2016-10-18 DIAGNOSIS — I495 Sick sinus syndrome: Secondary | ICD-10-CM | POA: Diagnosis not present

## 2016-10-18 DIAGNOSIS — Z79899 Other long term (current) drug therapy: Secondary | ICD-10-CM | POA: Diagnosis not present

## 2016-10-18 DIAGNOSIS — Z95810 Presence of automatic (implantable) cardiac defibrillator: Secondary | ICD-10-CM

## 2016-10-18 DIAGNOSIS — Z8249 Family history of ischemic heart disease and other diseases of the circulatory system: Secondary | ICD-10-CM | POA: Diagnosis not present

## 2016-10-18 DIAGNOSIS — Z7984 Long term (current) use of oral hypoglycemic drugs: Secondary | ICD-10-CM | POA: Diagnosis not present

## 2016-10-18 HISTORY — DX: Presence of automatic (implantable) cardiac defibrillator: Z95.810

## 2016-10-18 HISTORY — PX: ICD IMPLANT: EP1208

## 2016-10-18 LAB — BASIC METABOLIC PANEL
Anion gap: 10 (ref 5–15)
BUN: 28 mg/dL — ABNORMAL HIGH (ref 6–20)
CALCIUM: 9.1 mg/dL (ref 8.9–10.3)
CHLORIDE: 104 mmol/L (ref 101–111)
CO2: 25 mmol/L (ref 22–32)
Creatinine, Ser: 1.7 mg/dL — ABNORMAL HIGH (ref 0.61–1.24)
GFR calc non Af Amer: 39 mL/min — ABNORMAL LOW (ref >60)
GFR, EST AFRICAN AMERICAN: 45 mL/min — AB (ref >60)
Glucose, Bld: 136 mg/dL — ABNORMAL HIGH (ref 65–99)
Potassium: 5.1 mmol/L (ref 3.5–5.1)
SODIUM: 139 mmol/L (ref 135–145)

## 2016-10-18 LAB — CBC
HEMATOCRIT: 38.9 % — AB (ref 39.0–52.0)
HEMOGLOBIN: 12.6 g/dL — AB (ref 13.0–17.0)
MCH: 28.9 pg (ref 26.0–34.0)
MCHC: 32.4 g/dL (ref 30.0–36.0)
MCV: 89.2 fL (ref 78.0–100.0)
Platelets: 153 10*3/uL (ref 150–400)
RBC: 4.36 MIL/uL (ref 4.22–5.81)
RDW: 13.9 % (ref 11.5–15.5)
WBC: 6.2 10*3/uL (ref 4.0–10.5)

## 2016-10-18 LAB — GLUCOSE, CAPILLARY
GLUCOSE-CAPILLARY: 153 mg/dL — AB (ref 65–99)
Glucose-Capillary: 137 mg/dL — ABNORMAL HIGH (ref 65–99)
Glucose-Capillary: 110 mg/dL — ABNORMAL HIGH (ref 65–99)

## 2016-10-18 LAB — SURGICAL PCR SCREEN
MRSA, PCR: NEGATIVE
STAPHYLOCOCCUS AUREUS: NEGATIVE

## 2016-10-18 LAB — POCT ACTIVATED CLOTTING TIME: Activated Clotting Time: 169 seconds

## 2016-10-18 SURGERY — ICD IMPLANT

## 2016-10-18 MED ORDER — MIDAZOLAM HCL 5 MG/5ML IJ SOLN
INTRAMUSCULAR | Status: DC | PRN
  Administered 2016-10-18 (×2): 1 mg via INTRAVENOUS
  Administered 2016-10-18: 2 mg via INTRAVENOUS
  Administered 2016-10-18: 1 mg via INTRAVENOUS

## 2016-10-18 MED ORDER — TAMSULOSIN HCL 0.4 MG PO CAPS
0.4000 mg | ORAL_CAPSULE | Freq: Every day | ORAL | Status: DC
  Administered 2016-10-18 – 2016-10-19 (×2): 0.4 mg via ORAL
  Filled 2016-10-18 (×2): qty 1

## 2016-10-18 MED ORDER — LIDOCAINE HCL (PF) 1 % IJ SOLN
INTRAMUSCULAR | Status: AC
  Filled 2016-10-18: qty 60

## 2016-10-18 MED ORDER — CEFAZOLIN IN D5W 1 GM/50ML IV SOLN
1.0000 g | Freq: Four times a day (QID) | INTRAVENOUS | Status: AC
  Administered 2016-10-18 – 2016-10-19 (×3): 1 g via INTRAVENOUS
  Filled 2016-10-18 (×3): qty 50

## 2016-10-18 MED ORDER — MIDAZOLAM HCL 5 MG/5ML IJ SOLN
INTRAMUSCULAR | Status: AC
  Filled 2016-10-18: qty 5

## 2016-10-18 MED ORDER — MELOXICAM 7.5 MG PO TABS
15.0000 mg | ORAL_TABLET | Freq: Every day | ORAL | Status: DC
  Administered 2016-10-18 – 2016-10-19 (×2): 15 mg via ORAL
  Filled 2016-10-18: qty 2
  Filled 2016-10-18 (×2): qty 1
  Filled 2016-10-18 (×2): qty 2

## 2016-10-18 MED ORDER — SODIUM CHLORIDE 0.9 % IV SOLN
INTRAVENOUS | Status: DC
  Administered 2016-10-18: 07:00:00 via INTRAVENOUS

## 2016-10-18 MED ORDER — GABAPENTIN 300 MG PO CAPS
600.0000 mg | ORAL_CAPSULE | Freq: Two times a day (BID) | ORAL | Status: DC
  Administered 2016-10-18 – 2016-10-19 (×3): 600 mg via ORAL
  Filled 2016-10-18 (×3): qty 2

## 2016-10-18 MED ORDER — FENTANYL CITRATE (PF) 100 MCG/2ML IJ SOLN
INTRAMUSCULAR | Status: AC
Start: 2016-10-18 — End: 2016-10-18
  Filled 2016-10-18: qty 2

## 2016-10-18 MED ORDER — CHLORHEXIDINE GLUCONATE 4 % EX LIQD
60.0000 mL | Freq: Once | CUTANEOUS | Status: DC
  Filled 2016-10-18: qty 60

## 2016-10-18 MED ORDER — ACETAMINOPHEN 325 MG PO TABS: 325.0000 mg | ORAL_TABLET | ORAL | Status: DC | PRN

## 2016-10-18 MED ORDER — CEFAZOLIN SODIUM-DEXTROSE 2-4 GM/100ML-% IV SOLN
2.0000 g | INTRAVENOUS | Status: AC
  Administered 2016-10-18: 2 g via INTRAVENOUS
  Filled 2016-10-18: qty 100

## 2016-10-18 MED ORDER — CARVEDILOL 6.25 MG PO TABS
6.2500 mg | ORAL_TABLET | Freq: Two times a day (BID) | ORAL | Status: DC
  Administered 2016-10-18 – 2016-10-19 (×2): 6.25 mg via ORAL
  Filled 2016-10-18 (×3): qty 1

## 2016-10-18 MED ORDER — SODIUM CHLORIDE 0.9 % IR SOLN
80.0000 mg | Status: AC
  Administered 2016-10-18: 80 mg
  Filled 2016-10-18: qty 2

## 2016-10-18 MED ORDER — CEFAZOLIN SODIUM-DEXTROSE 2-4 GM/100ML-% IV SOLN
INTRAVENOUS | Status: AC
  Filled 2016-10-18: qty 100

## 2016-10-18 MED ORDER — SODIUM CHLORIDE 0.9 % IR SOLN
Status: AC
  Filled 2016-10-18: qty 2

## 2016-10-18 MED ORDER — HEPARIN (PORCINE) IN NACL 2-0.9 UNIT/ML-% IJ SOLN
INTRAMUSCULAR | Status: DC | PRN
  Administered 2016-10-18: 08:00:00

## 2016-10-18 MED ORDER — PRAVASTATIN SODIUM 10 MG PO TABS
10.0000 mg | ORAL_TABLET | Freq: Every day | ORAL | Status: DC
  Administered 2016-10-18: 10 mg via ORAL
  Filled 2016-10-18: qty 1

## 2016-10-18 MED ORDER — FUROSEMIDE 20 MG PO TABS
20.0000 mg | ORAL_TABLET | Freq: Every day | ORAL | Status: DC
  Administered 2016-10-18 – 2016-10-19 (×2): 20 mg via ORAL
  Filled 2016-10-18 (×2): qty 1

## 2016-10-18 MED ORDER — ASPIRIN EC 81 MG PO TBEC
81.0000 mg | DELAYED_RELEASE_TABLET | Freq: Every day | ORAL | Status: DC
  Administered 2016-10-18 – 2016-10-19 (×2): 81 mg via ORAL
  Filled 2016-10-18 (×2): qty 1

## 2016-10-18 MED ORDER — FENTANYL CITRATE (PF) 100 MCG/2ML IJ SOLN
INTRAMUSCULAR | Status: DC | PRN
  Administered 2016-10-18 (×2): 25 ug via INTRAVENOUS

## 2016-10-18 MED ORDER — MUPIROCIN 2 % EX OINT
TOPICAL_OINTMENT | Freq: Once | CUTANEOUS | Status: DC
  Filled 2016-10-18: qty 22

## 2016-10-18 MED ORDER — ALBUTEROL SULFATE (2.5 MG/3ML) 0.083% IN NEBU: 2.5000 mg | INHALATION_SOLUTION | Freq: Four times a day (QID) | RESPIRATORY_TRACT | Status: DC | PRN

## 2016-10-18 MED ORDER — ONDANSETRON HCL 4 MG/2ML IJ SOLN: 4.0000 mg | Freq: Four times a day (QID) | INTRAMUSCULAR | Status: DC | PRN

## 2016-10-18 MED ORDER — MUPIROCIN 2 % EX OINT
TOPICAL_OINTMENT | CUTANEOUS | Status: AC
Start: 2016-10-18 — End: 2016-10-18
  Administered 2016-10-18: 07:00:00
  Filled 2016-10-18: qty 22

## 2016-10-18 MED ORDER — LIDOCAINE HCL (PF) 1 % IJ SOLN
INTRAMUSCULAR | Status: DC | PRN
  Administered 2016-10-18: 45 mL

## 2016-10-18 MED ORDER — LISINOPRIL 2.5 MG PO TABS
2.5000 mg | ORAL_TABLET | Freq: Every day | ORAL | Status: DC
  Administered 2016-10-18 – 2016-10-19 (×2): 2.5 mg via ORAL
  Filled 2016-10-18 (×2): qty 1

## 2016-10-18 SURGICAL SUPPLY — 6 items
CABLE SURGICAL S-101-97-12 (CABLE) ×3 IMPLANT
ICD ELLIPSE DR CD2411-36C (ICD Generator) ×3 IMPLANT
LEAD TENDRIL MRI 52CM LPA1200M (Lead) ×3 IMPLANT
PAD DEFIB LIFELINK (PAD) ×3 IMPLANT
SHEATH CLASSIC 8F (SHEATH) ×3 IMPLANT
TRAY PACEMAKER INSERTION (PACKS) ×3 IMPLANT

## 2016-10-18 NOTE — H&P (Signed)
Electrophysiology Office Note Date: 10/04/2016  ID:  Randy [name], DOB 1945/09/28, MRN [id number]  PCP: Randy Norris, MD Electrophysiologist: Randy [name]  CC: ICD at [place] is a 71 y.o. male seen today for Randy Randy [name].  He presents today for routine electrophysiology followup.  Since last being seen in our clinic, the patient reports doing very well.  He continues to work but has fatigue and shortness of breath with exertion.  He denies chest pain, palpitations, dyspnea, PND, orthopnea, nausea, vomiting, dizziness, syncope, edema, weight gain, or early satiety.  He has not had ICD shocks.   Device History: STJ single chamber ICD implanted 2009 for ICM History of appropriate therapy: No History of AAD therapy: No       Past Medical History:  Diagnosis Date  . Anemia   . Arthritis    hip, hands- Osteoarthritis    . CAD (coronary artery disease)   . CKD (chronic kidney disease) stage 3, GFR 30-59 ml/min    Randy [name]  . Dyslipidemia   . GERD (gastroesophageal reflux disease)   . Hypertension    Randy [name]  . ICD (implantable cardiac defibrillator) in place    2009-St. Jude device  . Neuromuscular disorder (HCC)    neuropathy- in hands  . Pacemaker   . Shortness of breath   . Sleep apnea    started a sleep study but didn't completed, states PCP told him he has sleep  apnea, doesn't use CPAP  . Systolic dysfunction   . Type 2 diabetes with nephropathy (Randy [name])   . Vitamin D deficiency         Past Surgical History:  Procedure Laterality Date  . BACK SURGERY     lumbar & cervical fusion   . INSERT / REPLACE / REMOVE PACEMAKER    . left carpal tunnel release    . left rotator cuff surgery    . LUMBAR LAMINECTOMY Left 07/17/2013   Procedure: MICRODISCECTOMY LUMBAR LAMINECTOMY;  Surgeon: Randy Killings, MD;  Location: [place];  Service: Orthopedics;  Laterality: Left;  Left L4 Laminotomy, Lateral Recess Decompression, Cyst Excision  .  TOTAL HIP ARTHROPLASTY Left 09/17/2012   Randy [name]  . TOTAL HIP ARTHROPLASTY Left 09/17/2012   Procedure: Left TOTAL HIP ARTHROPLASTY ANTERIOR APPROACH;  Surgeon: Randy Killings, MD;  Location: [place];  Service: Orthopedics;  Laterality: Left;          Current Outpatient Prescriptions  Medication Sig Dispense Refill  . ACCU-CHEK SOFTCLIX LANCETS lancets Use as instructed to check blood sugar once daily and as needed.  Diagnosis:  E11.21  Non- insulin dependent. 100 each 3  . albuterol (PROVENTIL HFA;VENTOLIN HFA) 108 (90 Base) MCG/ACT inhaler Inhale 2 puffs into the lungs every 6 (six) hours as needed for wheezing or shortness of breath. 1 Inhaler 0  . Alcohol Swabs (B-D SINGLE USE SWABS REGULAR) PADS Use as instructed to clean area for glucose monitoring once daily and as needed.  Diagnosis:  E11.21  Non insulin-dependent 100 each 3  . aspirin EC 81 MG tablet Take 81 mg by mouth daily.    . Blood Glucose Calibration (ACCU-CHEK AVIVA) SOLN Use to calibrate blood glucose machine as recommended.  Diagnosis:  E11.21  Non-insulin dependent. 1 each 2  . Blood Glucose Monitoring Suppl (ACCU-CHEK AVIVA) device Use to check blood sugar once daily.  Dx: E11.21 1 each 0  . carvedilol (COREG) 6.25 MG tablet Take 1 tablet (6.25 mg total) by mouth 2 (  two) times daily with a meal. 180 tablet 2  . furosemide (LASIX) 20 MG tablet Take 1 tablet (20 mg total) by mouth daily. 90 tablet 3  . gabapentin (NEURONTIN) 300 MG capsule Take 2 capsules (600 mg total) by mouth 2 (two) times daily. 360 capsule 0  . glipiZIDE (GLUCOTROL) 5 MG tablet TAKE ONE TABLET BY MOUTH DAILY BEFORE BREAKFAST 90 tablet 2  . glucose blood (ACCU-CHEK AVIVA) test strip Use to check blood sugar once daily.  DX: E11.21 100 each 3  . lisinopril (PRINIVIL,ZESTRIL) 2.5 MG tablet Take 1 tablet (2.5 mg total) by mouth daily. 90 tablet 2  . lovastatin (MEVACOR) 20 MG tablet Take 1 tablet (20 mg total) by mouth at bedtime. 90 tablet 2  . meloxicam  (MOBIC) 15 MG tablet Take 1 tablet (15 mg total) by mouth daily. 90 tablet 0  . tamsulosin (FLOMAX) 0.4 MG CAPS capsule Take 1 capsule (0.4 mg total) by mouth daily. 90 capsule 2   No current facility-administered medications for this visit.     Allergies:   Patient has no known allergies.   Social History: Social History        Social History  . Marital status: Single    Spouse name: N/A  . Number of children: 2  . Years of education: N/A        Occupational History  . retired Retired    Armed forces logistics/support/administrative officer         Social History Main Topics  . Smoking status: Former Smoker    Types: Cigarettes    Quit date: 07/30/1984  . Smokeless tobacco: Never Used  . Alcohol use Yes     Comment: occ  . Drug use: No  . Sexual activity: No       Other Topics Concern  . Not on file      Social History Narrative   Does not have a living will.   Desires CPR, does want life support if not futile.          Family History: Family History  Problem Relation Age of Onset  . Heart attack Father 58  . Diabetes Brother     Review of Systems: All other systems reviewed and are otherwise negative except as noted above.   Physical Exam: VS:  BP 110/60   Pulse (!) 56   Ht 5\' 8"  (1.727 m)   Wt 198 lb (89.8 kg)   BMI 30.11 kg/m  , BMI Body mass index is 30.11 kg/m.  GEN- The patient is well appearing, alert and oriented x 3 today.   HEENT: normocephalic, atraumatic; sclera clear, conjunctiva pink; hearing intact; oropharynx clear; neck supple  Lungs- Clear to ausculation bilaterally, normal work of breathing.  No wheezes, rales, rhonchi Heart- Regular rate and rhythm  GI- soft, non-tender, non-distended, bowel sounds present  Extremities- no clubbing, cyanosis, or edema  MS- no significant deformity or atrophy Skin- warm and dry, no rash or lesion; ICD pocket well healed Psych- euthymic mood, full affect Neuro- strength and sensation are  intact  ICD interrogation- reviewed in detail today,  See PACEART report  EKG:  EKG is ordered today. The ekg ordered today shows sinus bradycardia, rate 56  Recent Labs: 03/09/2016: ALT 14; BUN 35; Creatinine, Ser 1.56; Potassium 5.3; Sodium 138; TSH 1.11   Wt Readings from Last 3 Encounters:  10/04/16 198 lb (89.8 kg)  09/10/16 200 lb (90.7 kg)  06/13/16 197 lb 12.8 oz (89.7 kg)  Other studies Reviewed: Additional studies/ records that were reviewed today include: Randy Tanna Furry office notes  Assessment and Plan:  1.  Chronic systolic dysfunction euvolemic today Stable on an appropriate medical regimen ICD at [facility] Will update echo The patient is clear in his decision to proceed with ICD gen change. Given sinus bradycardia as well as exercise intolerance and fatigue, it would be reasonable to plan upgrade to dual chamber device at time of implant.  He will discuss further with Randy Randy [name] day of procedure.  2.  CAD No recent ischemic symptoms Continue medical therapy  3.  HTN Stable No change required today  Current medicines are reviewed at length with the patient today.   The patient does not have concerns regarding his medicines.  The following changes were made today:  none  Labs/ tests ordered today include: echo    Orders Placed This Encounter  Procedures  . CBC  . Basic metabolic panel  . EKG 12-Lead  . ECHOCARDIOGRAM COMPLETE     Follow up with Randy Randy [name] after upgrade    Signed, Chanetta Marshall, NP 10/04/2016 9:55 AM  [place] [address] [phone] (office) [phone] (fax)  EP Attending  Patient seen and examined. Agree with above. He has reached ERI. I have discussed the issues with the patient regarding sinus node dysfunction. Will plan on attempting to place a RA lead.  Mikle Bosworth.D.

## 2016-10-18 NOTE — Discharge Instructions (Signed)
° ° °  Supplemental Discharge Instructions for  °Pacemaker/Defibrillator Patients ° °Activity °No heavy lifting or vigorous activity with your left/right arm for 6 to 8 weeks.  Do not raise your left/right arm above your head for one week.  Gradually raise your affected arm as drawn below. ° °        °   10/22/16                     10/23/16                    10/24/16                   10/25/16 °__ ° °NO DRIVING for  1 week   ; you may begin driving on  10/25/16   . ° °WOUND CARE °- Keep the wound area clean and dry.  Do not get this area wet for one week. No showers for one week; you may shower on 10/25/16  . °- The tape/steri-strips on your wound will fall off; do not pull them off.  No bandage is needed on the site.  DO  NOT apply any creams, oils, or ointments to the wound area. °- If you notice any drainage or discharge from the wound, any swelling or bruising at the site, or you develop a fever > 101? F after you are discharged home, call the office at once. ° °Special Instructions °- You are still able to use cellular telephones; use the ear opposite the side where you have your pacemaker/defibrillator.  Avoid carrying your cellular phone near your device. °- When traveling through airports, show security personnel your identification card to avoid being screened in the metal detectors.  Ask the security personnel to use the hand wand. °- Avoid arc welding equipment, MRI testing (magnetic resonance imaging), TENS units (transcutaneous nerve stimulators).  Call the office for questions about other devices. °- Avoid electrical appliances that are in poor condition or are not properly grounded. °- Microwave ovens are safe to be near or to operate. ° °Additional information for defibrillator patients should your device go off: °- If your device goes off ONCE and you feel fine afterward, notify the device clinic nurses. °- If your device goes off ONCE and you do not feel well afterward, call 911. °- If your device goes  off TWICE, call 911. °- If your device goes off THREE times in one day, call 911. ° °DO NOT DRIVE YOURSELF OR A FAMILY MEMBER °WITH A DEFIBRILLATOR TO THE HOSPITAL--CALL 911. ° °

## 2016-10-18 NOTE — Discharge Summary (Signed)
ELECTROPHYSIOLOGY PROCEDURE DISCHARGE SUMMARY    Patient ID: Randy [name],  MRN: [id number], DOB/AGE: [birth year] 71 y.o.  Admit date: 10/18/2016 Discharge date: 10/18/16  Primary Care Physician: Arnette Norris, MD  Primary Cardiologist/Electrophysiologist: Dr. Lovena Le  Primary Discharge Diagnosis:  1. ICM 2. Symptomatic sinus bradycardia  Secondary Discharge Diagnosis:  1. CAD 2. HTN 3. DM  No Known Allergies   Procedures This Admission:  1.  Implantation/upgrade to a dual chamber (addition of RA lead with generator change) ICD on 10/18/16 by Dr Lovena Le.  The patient received aSt. Jude dual-chamber ICD, serial number [id number], St. Jude active fixation pacing lead, serial [id number], into the right atrium.   DFT's were successful at 72 J.  There were no immediate post procedure complications. 2.  CXR on 10/19/16 demonstrated no pneumothorax status post device implantation.   Brief HPI: Randy [name] is a 71 y.o. male was referred to electrophysiology in the outpatient setting secondary to his ICD reaching ERI, given his SB and exercise intolerance, was decided to add RA lead at the time of generator change.  Past medical history includes ICM, chronic CHF, HTN, CAD, DM.   Risks, benefits, and alternatives to ICD implantation were reviewed with the patient who wished to proceed.   Hospital Course:  The patient was admitted and underwent implantation/upgrade to dual chamber ICD with details as outlined above. he was monitored on telemetry overnight which demonstrated SR.  Left chest was without hematoma or ecchymosis.  The device was interrogated and found to be functioning normally.  CXR was obtained and demonstrated no pneumothorax status post device implantation.  Wound care, arm mobility, and restrictions were reviewed with the patient.  The patient was examined by Dr. Lovena Le and considered stable for discharge to home.   The patient's discharge medications include an ACE-I  (lisinopril) and beta blocker (carvedilol).   Physical Exam: Vitals:   10/18/16 2130 10/19/16 0230 10/19/16 0325 10/19/16 0800  BP: (!) 142/99 96/68 136/70 108/62  Pulse: (!) 57   69  Resp: 16 (!) 21 17 19   Temp: 98 F (36.7 C)  97.8 F (36.6 C) 99.2 F (37.3 C)  TempSrc: Oral  Oral Oral  SpO2: 97%  99% 96%  Weight:   197 lb 9.6 oz (89.6 kg)   Height:        GEN- The patient is well appearing, alert and oriented x 3 today.   HEENT: normocephalic, atraumatic; sclera clear, conjunctiva pink; hearing intact; oropharynx clear Lungs- CTA b/l , normal work of breathing.  No wheezes, rales, rhonchi Heart- RRR, no murmurs, rubs or gallops, PMI not laterally displaced GI- soft, non-tender, non-distended Extremities- no clubbing, cyanosis, or edema; MS- no significant deformity or atrophy Skin- warm and dry, no rash or lesion, left chest without hematoma/ecchymosis Psych- euthymic mood, full affect Neuro- no gross defecits  Labs:   Lab Results  Component Value Date   WBC 6.2 10/18/2016   HGB 12.6 (L) 10/18/2016   HCT 38.9 (L) 10/18/2016   MCV 89.2 10/18/2016   PLT 153 10/18/2016     Recent Labs Lab 10/18/16 0604  NA 139  K 5.1  CL 104  CO2 25  BUN 28*  CREATININE 1.70*  CALCIUM 9.1  GLUCOSE 136*    Discharge Medications:  Allergies as of 10/19/2016   No Known Allergies     Medication List    TAKE these medications   ACCU-CHEK AVIVA device Use to check blood sugar once daily.  Dx:  E11.21   ACCU-CHEK AVIVA Soln Use to calibrate blood glucose machine as recommended.  Diagnosis:  E11.21  Non-insulin dependent.   ACCU-CHEK SOFTCLIX LANCETS lancets Use as instructed to check blood sugar once daily and as needed.  Diagnosis:  E11.21  Non- insulin dependent.   albuterol 108 (90 Base) MCG/ACT inhaler Commonly known as:  PROVENTIL HFA;VENTOLIN HFA Inhale 2 puffs into the lungs every 6 (six) hours as needed for wheezing or shortness of breath.   aspirin EC 81 MG  tablet Take 81 mg by mouth daily.   B-D SINGLE USE SWABS REGULAR Pads Use as instructed to clean area for glucose monitoring once daily and as needed.  Diagnosis:  E11.21  Non insulin-dependent   carvedilol 6.25 MG tablet Commonly known as:  COREG Take 1 tablet (6.25 mg total) by mouth 2 (two) times daily with a meal.   furosemide 20 MG tablet Commonly known as:  LASIX Take 1 tablet (20 mg total) by mouth daily.   gabapentin 300 MG capsule Commonly known as:  NEURONTIN Take 2 capsules (600 mg total) by mouth 2 (two) times daily.   glipiZIDE 5 MG tablet Commonly known as:  GLUCOTROL TAKE ONE TABLET BY MOUTH DAILY BEFORE BREAKFAST   glucose blood test strip Commonly known as:  ACCU-CHEK AVIVA Use to check blood sugar once daily.  DX: E11.21   lisinopril 2.5 MG tablet Commonly known as:  PRINIVIL,ZESTRIL Take 1 tablet (2.5 mg total) by mouth daily.   lovastatin 20 MG tablet Commonly known as:  MEVACOR Take 1 tablet (20 mg total) by mouth at bedtime.   meloxicam 15 MG tablet Commonly known as:  MOBIC Take 1 tablet (15 mg total) by mouth daily.   tamsulosin 0.4 MG Caps capsule Commonly known as:  FLOMAX Take 1 capsule (0.4 mg total) by mouth daily.       Disposition:  Home   Discharge Instructions    Diet - low sodium heart healthy    Complete by:  As directed    Increase activity slowly    Complete by:  As directed      Follow-up Information    [place] [facility] Follow up on 10/31/2016.   Specialty:  Cardiology Why:  9:30AM, wound check Contact information: [address], [place]       Cristopher Peru, MD Follow up on 01/24/2017.   Specialty:  Cardiology Why:  9:30AM Contact information: [address] [place] [address] [phone]           Duration of Discharge Encounter: Greater than 30 minutes including physician time.  Venetia Night,  PA-C 10/19/2016 8:54 AM  EP Attending  Patient seen and examined. Agree with above. His ICD is working normally. Device interogation demonstrates normal function. Usual followup.   Mikle Bosworth.D.

## 2016-10-18 NOTE — H&P (Signed)
ICD Criteria  Current LVEF:35%. Within 12 months prior to implant: Yes   Heart failure history: Yes, Class II  Cardiomyopathy history: Yes, Non-Ischemic Cardiomyopathy.  Atrial Fibrillation/Atrial Flutter: No.  Ventricular tachycardia history: No.  Cardiac arrest history: No.  History of syndromes with risk of sudden death: No.  Previous ICD: Yes, Reason for ICD:  Primary prevention.  Current ICD indication: Primary  PPM indication: yes, sinus node dysfunction   Class I or II Bradycardia indication present: Yes  Beta Blocker therapy for 3 or more months: Yes, prescribed.   Ace Inhibitor/ARB therapy for 3 or more months: Yes, prescribed.

## 2016-10-19 ENCOUNTER — Other Ambulatory Visit: Payer: Self-pay

## 2016-10-19 ENCOUNTER — Ambulatory Visit (HOSPITAL_COMMUNITY): Payer: Medicare HMO

## 2016-10-19 DIAGNOSIS — I2510 Atherosclerotic heart disease of native coronary artery without angina pectoris: Secondary | ICD-10-CM | POA: Diagnosis not present

## 2016-10-19 DIAGNOSIS — I130 Hypertensive heart and chronic kidney disease with heart failure and stage 1 through stage 4 chronic kidney disease, or unspecified chronic kidney disease: Secondary | ICD-10-CM | POA: Diagnosis not present

## 2016-10-19 DIAGNOSIS — I255 Ischemic cardiomyopathy: Secondary | ICD-10-CM | POA: Diagnosis not present

## 2016-10-19 DIAGNOSIS — E1122 Type 2 diabetes mellitus with diabetic chronic kidney disease: Secondary | ICD-10-CM | POA: Diagnosis not present

## 2016-10-19 DIAGNOSIS — I495 Sick sinus syndrome: Secondary | ICD-10-CM | POA: Diagnosis not present

## 2016-10-19 DIAGNOSIS — I5022 Chronic systolic (congestive) heart failure: Secondary | ICD-10-CM | POA: Diagnosis not present

## 2016-10-19 DIAGNOSIS — D631 Anemia in chronic kidney disease: Secondary | ICD-10-CM | POA: Diagnosis not present

## 2016-10-19 DIAGNOSIS — I428 Other cardiomyopathies: Secondary | ICD-10-CM | POA: Diagnosis not present

## 2016-10-19 DIAGNOSIS — N183 Chronic kidney disease, stage 3 (moderate): Secondary | ICD-10-CM | POA: Diagnosis not present

## 2016-10-19 DIAGNOSIS — Z4502 Encounter for adjustment and management of automatic implantable cardiac defibrillator: Secondary | ICD-10-CM | POA: Diagnosis not present

## 2016-10-19 LAB — GLUCOSE, CAPILLARY: GLUCOSE-CAPILLARY: 133 mg/dL — AB (ref 65–99)

## 2016-10-19 MED FILL — Midazolam HCl Inj 5 MG/5ML (Base Equivalent): INTRAMUSCULAR | Qty: 5 | Status: AC

## 2016-10-19 NOTE — Progress Notes (Signed)
Dc instructions given to pt at this time.  Pt verbalized understanding of all instructions.  No c/o pain.  No s/s of any acute distress.  Wife at bedside and to drive pt home.

## 2016-10-22 ENCOUNTER — Telehealth: Payer: Self-pay | Admitting: *Deleted

## 2016-10-22 NOTE — Telephone Encounter (Signed)
Lm requesting return call to complete TCM and confirm hosp f/u appt  

## 2016-10-29 ENCOUNTER — Telehealth: Payer: Self-pay | Admitting: Family Medicine

## 2016-10-29 NOTE — Telephone Encounter (Signed)
Patient returned Randy [name]'s call about hospital.

## 2016-10-31 ENCOUNTER — Ambulatory Visit (INDEPENDENT_AMBULATORY_CARE_PROVIDER_SITE_OTHER): Payer: Medicare HMO | Admitting: *Deleted

## 2016-10-31 DIAGNOSIS — I5022 Chronic systolic (congestive) heart failure: Secondary | ICD-10-CM

## 2016-10-31 LAB — CUP PACEART INCLINIC DEVICE CHECK
HIGH POWER IMPEDANCE MEASURED VALUE: 63 Ohm
Implantable Lead Location: 753859
Implantable Lead Location: 753860
Implantable Lead Model: 7121
Implantable Pulse Generator Implant Date: 20180322
Lead Channel Impedance Value: 437.5 Ohm
Lead Channel Pacing Threshold Amplitude: 1.25 V
Lead Channel Pacing Threshold Pulse Width: 0.5 ms
Lead Channel Pacing Threshold Pulse Width: 0.6 ms
Lead Channel Pacing Threshold Pulse Width: 0.6 ms
Lead Channel Sensing Intrinsic Amplitude: 2.9 mV
Lead Channel Sensing Intrinsic Amplitude: 6.8 mV
Lead Channel Setting Pacing Amplitude: 1.5 V
Lead Channel Setting Pacing Amplitude: 1.625
Lead Channel Setting Pacing Pulse Width: 0.6 ms
MDC IDC LEAD IMPLANT DT: 20091022
MDC IDC LEAD IMPLANT DT: 20180322
MDC IDC MSMT LEADCHNL RA PACING THRESHOLD AMPLITUDE: 0.75 V
MDC IDC MSMT LEADCHNL RA PACING THRESHOLD AMPLITUDE: 0.75 V
MDC IDC MSMT LEADCHNL RA PACING THRESHOLD PULSEWIDTH: 0.5 ms
MDC IDC MSMT LEADCHNL RV IMPEDANCE VALUE: 462.5 Ohm
MDC IDC MSMT LEADCHNL RV PACING THRESHOLD AMPLITUDE: 1.25 V
MDC IDC PG SERIAL: 7413245
MDC IDC SESS DTM: 20180404100606
MDC IDC SET LEADCHNL RV SENSING SENSITIVITY: 0.5 mV
MDC IDC STAT BRADY RA PERCENT PACED: 0.19 %
MDC IDC STAT BRADY RV PERCENT PACED: 0.1 %

## 2016-10-31 NOTE — Progress Notes (Signed)
Wound check appointment. Steri-strips removed. Wound without redness or edema. Incision edges approximated, wound well healed. Normal device function. Thresholds, sensing, and impedances consistent with implant measurements. Device programmed with autocapture on. Histogram distribution appropriate for patient and level of activity. No mode switches or ventricular arrhythmias noted. Patient educated about wound care, arm mobility, lifting restrictions, shock plan. ROV w/ GT 01/25/2016

## 2016-11-26 ENCOUNTER — Other Ambulatory Visit: Payer: Self-pay | Admitting: Family Medicine

## 2017-01-24 ENCOUNTER — Encounter: Payer: Self-pay | Admitting: Internal Medicine

## 2017-01-24 ENCOUNTER — Ambulatory Visit (INDEPENDENT_AMBULATORY_CARE_PROVIDER_SITE_OTHER): Payer: Medicare HMO | Admitting: Internal Medicine

## 2017-01-24 VITALS — BP 100/60 | HR 56 | Ht 68.0 in | Wt 200.2 lb

## 2017-01-24 DIAGNOSIS — I5022 Chronic systolic (congestive) heart failure: Secondary | ICD-10-CM

## 2017-01-24 NOTE — Patient Instructions (Signed)
Medication Instructions:  Your physician recommends that you continue on your current medications as directed. Please refer to the Current Medication list given to you today.   Labwork: None ordered   Testing/Procedures: None ordered   Follow-Up: Your physician wants you to follow-up in: 9 months with Dr. Lovena Le.  You will receive a reminder letter in the mail two months in advance. If you don't receive a letter, please call our office to schedule the follow-up appointment.  Remote monitoring is used to monitor your ICD from home. This monitoring reduces the number of office visits required to check your device to one time per year. It allows [place] to keep an eye on the functioning of your device to ensure it is working properly. You are scheduled for a device check from home on 04/25/17. You may send your transmission at any time that day. If you have a wireless device, the transmission will be sent automatically. After your physician reviews your transmission, you will receive a postcard with your next transmission date.    Any Other Special Instructions Will Be Listed Below (If Applicable).     If you need a refill on your cardiac medications before your next appointment, please call your pharmacy.

## 2017-01-24 NOTE — Progress Notes (Signed)
HPI Mr. Cowdrey returns today for followup. He is a pleasant 71 yo man with a h/o HTN, chronic systolic CHF, s/p ICD implant. In the interim he has undergone ICD gen change out. He feels well. He is back playing golf without limitation. No chest pain or sob.   No Known Allergies   Current Outpatient Prescriptions  Medication Sig Dispense Refill  . ACCU-CHEK SOFTCLIX LANCETS lancets Use as instructed to check blood sugar once daily and as needed.  Diagnosis:  E11.21  Non- insulin dependent. 100 each 3  . albuterol (PROVENTIL HFA;VENTOLIN HFA) 108 (90 Base) MCG/ACT inhaler Inhale 2 puffs into the lungs every 6 (six) hours as needed for wheezing or shortness of breath. 1 Inhaler 0  . Alcohol Swabs (B-D SINGLE USE SWABS REGULAR) PADS Use as instructed to clean area for glucose monitoring once daily and as needed.  Diagnosis:  E11.21  Non insulin-dependent 100 each 3  . aspirin EC 81 MG tablet Take 81 mg by mouth daily.    . Blood Glucose Calibration (ACCU-CHEK AVIVA) SOLN Use to calibrate blood glucose machine as recommended.  Diagnosis:  E11.21  Non-insulin dependent. 1 each 2  . Blood Glucose Monitoring Suppl (ACCU-CHEK AVIVA) device Use to check blood sugar once daily.  Dx: E11.21 1 each 0  . carvedilol (COREG) 6.25 MG tablet Take 1 tablet (6.25 mg total) by mouth 2 (two) times daily with a meal. 180 tablet 2  . furosemide (LASIX) 20 MG tablet Take 1 tablet (20 mg total) by mouth daily. 90 tablet 3  . gabapentin (NEURONTIN) 300 MG capsule Take 2 capsules (600 mg total) by mouth 2 (two) times daily. 360 capsule 0  . glipiZIDE (GLUCOTROL) 5 MG tablet TAKE ONE TABLET BY MOUTH DAILY BEFORE BREAKFAST 90 tablet 2  . glucose blood (ACCU-CHEK AVIVA) test strip Use to check blood sugar once daily.  DX: E11.21 100 each 3  . lisinopril (PRINIVIL,ZESTRIL) 2.5 MG tablet Take 1 tablet (2.5 mg total) by mouth daily. 90 tablet 2  . lovastatin (MEVACOR) 20 MG tablet Take 1 tablet (20 mg total) by mouth  at bedtime. 90 tablet 2  . meloxicam (MOBIC) 15 MG tablet TAKE 1 TABLET EVERY DAY 90 tablet 1  . tamsulosin (FLOMAX) 0.4 MG CAPS capsule Take 1 capsule (0.4 mg total) by mouth daily. 90 capsule 2   No current facility-administered medications for this visit.      Past Medical History:  Diagnosis Date  . AICD (automatic cardioverter/defibrillator) present 10/18/2016  . Anemia   . Arthritis    hip, hands- Osteoarthritis    . CAD (coronary artery disease)   . CKD (chronic kidney disease) stage 3, GFR 30-59 ml/min    [name]  . Dyslipidemia   . GERD (gastroesophageal reflux disease)   . Hypertension    g. taylor  . ICD (implantable cardiac defibrillator) in place    2009-St. Jude device  . Neuromuscular disorder (HCC)    neuropathy- in hands  . Pacemaker   . Shortness of breath   . Sleep apnea    started a sleep study but didn't completed, states PCP told him he has sleep  apnea, doesn't use CPAP  . Systolic dysfunction   . Type 2 diabetes with nephropathy ([place])   . Vitamin D deficiency     ROS:   All systems reviewed and negative except as noted in the HPI.   Past Surgical History:  Procedure Laterality Date  . BACK SURGERY  lumbar & cervical fusion   . ICD IMPLANT N/A 10/18/2016   Procedure: ICD Implant- Upgrade to Dual Chamber;  Surgeon: Evans Lance, MD;  Location: [place] [place] [facility];  Service: Cardiovascular;  Laterality: N/A;  . INSERT / REPLACE / REMOVE PACEMAKER    . left carpal tunnel release    . left rotator cuff surgery    . LUMBAR LAMINECTOMY Left 07/17/2013   Procedure: MICRODISCECTOMY LUMBAR LAMINECTOMY;  Surgeon: Marybelle Killings, MD;  Location: [place];  Service: Orthopedics;  Laterality: Left;  Left L4 Laminotomy, Lateral Recess Decompression, Cyst Excision  . TOTAL HIP ARTHROPLASTY Left 09/17/2012   Dr Lorin Mercy  . TOTAL HIP ARTHROPLASTY Left 09/17/2012   Procedure: Left TOTAL HIP ARTHROPLASTY ANTERIOR APPROACH;  Surgeon: Marybelle Killings, MD;  Location: [place];   Service: Orthopedics;  Laterality: Left;     Family History  Problem Relation Age of Onset  . Heart attack Father 31  . Diabetes Brother      Social History   Social History  . Marital status: Single    Spouse name: N/A  . Number of children: 2  . Years of education: N/A   Occupational History  . 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 Smoking status: Former Smoker    Types: Cigarettes    Quit date: 07/30/1984  . Smokeless tobacco: Never Used  . Alcohol use Yes     Comment: occ  . Drug use: No  . Sexual activity: No   Other Topics Concern  . Not on file   Social History Narrative   Does not have a living will.   Desires CPR, does want life support if not futile.           BP 100/60   Pulse (!) 56   Ht 5\' 8"  (1.727 m)   Wt 200 lb 3.2 oz (90.8 kg)   SpO2 93%   BMI 30.44 kg/m  Physical Exam:  Well appearing 71 year old man,NAD HEENT: Unremarkable Neck:  6 cm JVD, no thyromegally Back:  No CVA tenderness Lungs:  Clear with no wheezes, rales, or rhonchi. HEART:  Regular rate rhythm, no murmurs, no rubs, no clicks Abd:  soft, positive bowel sounds, no organomegally, no rebound, no guarding Ext:  2 plus pulses, no edema, no cyanosis, no clubbing Skin:  No rashes no nodules Neuro:  CN II through XII intact, motor grossly intact  ECG - sinus bradycardia  DEVICE  Normal device function.  See PaceArt for details. 1.5 years from ERI  Assess/Plan: 1. Chronic systolic heart failure - the patient has done well with a low dose of lasix and a low sodium diet. 2. Angina/CAD - he denies anginal symptoms. Will follow. 3. ICD - his St. Jude single chamber ICD is working normally. Will follow. 4. Hypertensive heart disease - his blood pressure is now well controlled. Will follow.  Mikle Bosworth.D.

## 2017-02-11 ENCOUNTER — Ambulatory Visit (INDEPENDENT_AMBULATORY_CARE_PROVIDER_SITE_OTHER): Payer: Medicare HMO | Admitting: Family Medicine

## 2017-02-11 ENCOUNTER — Encounter: Payer: Self-pay | Admitting: Family Medicine

## 2017-02-11 VITALS — BP 100/60 | HR 77 | Ht 68.0 in | Wt 202.0 lb

## 2017-02-11 DIAGNOSIS — J069 Acute upper respiratory infection, unspecified: Secondary | ICD-10-CM | POA: Diagnosis not present

## 2017-02-11 MED ORDER — ALBUTEROL SULFATE HFA 108 (90 BASE) MCG/ACT IN AERS: 2.0000 | INHALATION_SPRAY | Freq: Four times a day (QID) | RESPIRATORY_TRACT | 0 refills | Status: DC | PRN

## 2017-02-11 MED ORDER — HYDROCODONE-HOMATROPINE 5-1.5 MG/5ML PO SYRP: 5.0000 mL | ORAL_SOLUTION | Freq: Three times a day (TID) | ORAL | 0 refills | Status: DC | PRN

## 2017-02-11 MED ORDER — LEVOFLOXACIN 500 MG PO TABS: 500.0000 mg | ORAL_TABLET | Freq: Every day | ORAL | 0 refills | Status: DC

## 2017-02-11 NOTE — Progress Notes (Signed)
low back pain

## 2017-02-11 NOTE — Progress Notes (Signed)
SUBJECTIVE:  Randy [name] is a 71 y.o. male who complains of coryza, congestion and productive cough for 21 days. He denies a history of anorexia and chest pain and admits to a history of asthma. Patient denies smoke cigarettes.   Current Outpatient Prescriptions on File Prior to Visit  Medication Sig Dispense Refill  . ACCU-CHEK SOFTCLIX LANCETS lancets Use as instructed to check blood sugar once daily and as needed.  Diagnosis:  E11.21  Non- insulin dependent. 100 each 3  . Alcohol Swabs (B-D SINGLE USE SWABS REGULAR) PADS Use as instructed to clean area for glucose monitoring once daily and as needed.  Diagnosis:  E11.21  Non insulin-dependent 100 each 3  . aspirin EC 81 MG tablet Take 81 mg by mouth daily.    . Blood Glucose Calibration (ACCU-CHEK AVIVA) SOLN Use to calibrate blood glucose machine as recommended.  Diagnosis:  E11.21  Non-insulin dependent. 1 each 2  . Blood Glucose Monitoring Suppl (ACCU-CHEK AVIVA) device Use to check blood sugar once daily.  Dx: E11.21 1 each 0  . carvedilol (COREG) 6.25 MG tablet Take 1 tablet (6.25 mg total) by mouth 2 (two) times daily with a meal. 180 tablet 2  . furosemide (LASIX) 20 MG tablet Take 1 tablet (20 mg total) by mouth daily. 90 tablet 3  . gabapentin (NEURONTIN) 300 MG capsule Take 2 capsules (600 mg total) by mouth 2 (two) times daily. 360 capsule 0  . glipiZIDE (GLUCOTROL) 5 MG tablet TAKE ONE TABLET BY MOUTH DAILY BEFORE BREAKFAST 90 tablet 2  . glucose blood (ACCU-CHEK AVIVA) test strip Use to check blood sugar once daily.  DX: E11.21 100 each 3  . lisinopril (PRINIVIL,ZESTRIL) 2.5 MG tablet Take 1 tablet (2.5 mg total) by mouth daily. 90 tablet 2  . lovastatin (MEVACOR) 20 MG tablet Take 1 tablet (20 mg total) by mouth at bedtime. 90 tablet 2  . meloxicam (MOBIC) 15 MG tablet TAKE 1 TABLET EVERY DAY 90 tablet 1  . tamsulosin (FLOMAX) 0.4 MG CAPS capsule Take 1 capsule (0.4 mg total) by mouth daily. 90 capsule 2   No current  facility-administered medications on file prior to visit.     No Known Allergies  Past Medical History:  Diagnosis Date  . AICD (automatic cardioverter/defibrillator) present 10/18/2016  . Anemia   . Arthritis    hip, hands- Osteoarthritis    . CAD (coronary artery disease)   . CKD (chronic kidney disease) stage 3, GFR 30-59 ml/min    [name]  . Dyslipidemia   . GERD (gastroesophageal reflux disease)   . Hypertension    g. taylor  . ICD (implantable cardiac defibrillator) in place    2009-St. Jude device  . Neuromuscular disorder (HCC)    neuropathy- in hands  . Pacemaker   . Shortness of breath   . Sleep apnea    started a sleep study but didn't completed, states PCP told him he has sleep  apnea, doesn't use CPAP  . Systolic dysfunction   . Type 2 diabetes with nephropathy ([place])   . Vitamin D deficiency     Past Surgical History:  Procedure Laterality Date  . BACK SURGERY     lumbar & cervical fusion   . ICD IMPLANT N/A 10/18/2016   Procedure: ICD Implant- Upgrade to Dual Chamber;  Surgeon: Evans Lance, MD;  Location: [place] [facility];  Service: Cardiovascular;  Laterality: N/A;  . INSERT / REPLACE / REMOVE PACEMAKER    . left  carpal tunnel release    . left rotator cuff surgery    . LUMBAR LAMINECTOMY Left 07/17/2013   Procedure: MICRODISCECTOMY LUMBAR LAMINECTOMY;  Surgeon: Marybelle Killings, MD;  Location: [place];  Service: Orthopedics;  Laterality: Left;  Left L4 Laminotomy, Lateral Recess Decompression, Cyst Excision  . TOTAL HIP ARTHROPLASTY Left 09/17/2012   Dr Lorin Mercy  . TOTAL HIP ARTHROPLASTY Left 09/17/2012   Procedure: Left TOTAL HIP ARTHROPLASTY ANTERIOR APPROACH;  Surgeon: Marybelle Killings, MD;  Location: [place];  Service: Orthopedics;  Laterality: Left;    Family History  Problem Relation Age of Onset  . Heart attack Father 54  . Diabetes Brother     Social History   Social History  . Marital status: Single    Spouse name: N/A  . Number of children: 2   . Years of education: N/A   Occupational History  . 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 Smoking status: Former Smoker    Types: Cigarettes    Quit date: 07/30/1984  . Smokeless tobacco: Never Used  . Alcohol use Yes     Comment: occ  . Drug use: No  . Sexual activity: No   Other Topics Concern  . Not on file   Social History Narrative   Does not have a living will.   Desires CPR, does want life support if not futile.         The PMH, PSH, Social History, Family History, Medications, and allergies have been reviewed in [facility], and have been updated if relevant.  OBJECTIVE: BP 100/60   Pulse 77   Ht 5\' 8"  (1.727 m)   Wt 202 lb (91.6 kg)   SpO2 99%   BMI 30.71 kg/m   He appears well, vital signs are as noted. Ears normal.  Throat and pharynx normal.  Neck supple. No adenopathy in the neck. Nose is congested. Sinuses non tender, scattered exp wheezes and rhonchi throughout  ASSESSMENT:  bronchitis  PLAN: Given duration and progression of symptoms, will treat for bacterial process with levaquin, proair inhaler and as needed hycodan.  Symptomatic therapy suggested: push fluids, rest and return office visit prn if symptoms persist or worsen.Call or return to clinic prn if these symptoms worsen or fail to improve as anticipated.

## 2017-02-13 ENCOUNTER — Other Ambulatory Visit: Payer: Self-pay | Admitting: Family Medicine

## 2017-02-13 ENCOUNTER — Telehealth: Payer: Self-pay | Admitting: Family Medicine

## 2017-02-13 NOTE — Telephone Encounter (Signed)
What pharmacy? Can he callback with med names?

## 2017-02-13 NOTE — Telephone Encounter (Signed)
Pt called stating he received a call from pharmacy. They said he was out of refills on a few prescriptions and that he needed to call to let pcp know. He wasn't sure which medications it was for.

## 2017-03-04 ENCOUNTER — Encounter: Payer: Self-pay | Admitting: Family Medicine

## 2017-03-04 ENCOUNTER — Ambulatory Visit (INDEPENDENT_AMBULATORY_CARE_PROVIDER_SITE_OTHER): Payer: Medicare HMO | Admitting: Family Medicine

## 2017-03-04 DIAGNOSIS — J40 Bronchitis, not specified as acute or chronic: Secondary | ICD-10-CM | POA: Diagnosis not present

## 2017-03-04 MED ORDER — DOXYCYCLINE HYCLATE 100 MG PO TABS: 100.0000 mg | ORAL_TABLET | Freq: Two times a day (BID) | ORAL | 0 refills | Status: DC

## 2017-03-04 MED ORDER — HYDROCODONE-HOMATROPINE 5-1.5 MG/5ML PO SYRP: 5.0000 mL | ORAL_SOLUTION | Freq: Three times a day (TID) | ORAL | 0 refills | Status: DC | PRN

## 2017-03-04 NOTE — Assessment & Plan Note (Signed)
Lung exam improved s/p levaquin and with using rescue inhaler but still has wheezes and rhonchi on exam. Will treat with course of doxycyline, refill hycodan. Call or return to clinic prn if these symptoms worsen or fail to improve as anticipated. The patient indicates understanding of these issues and agrees with the plan.

## 2017-03-04 NOTE — Progress Notes (Signed)
Subjective:   Patient ID: Randy [name], male    DOB: 19-Mar-1946, 71 y.o.   MRN: [id number]  Randy [name] is a pleasant 71 y.o. year old male who presents to clinic today with Cough (productive at times light green, some wheezing)  on 03/04/2017  HPI:  I saw him on 02/11/17 for URI symptoms which were progressive over weeks. Wheezes and rhonchi on exam.  Notes reviewed- placed on levaquin and hycodan as needed for cough.  Here today because he finished the abx but feels he is still sick.  Wheezing is a bit better but cough is still productive of greenish sputum.  No fevers or chills.  Current Outpatient Prescriptions on File Prior to Visit  Medication Sig Dispense Refill  . ACCU-CHEK SOFTCLIX LANCETS lancets Use as instructed to check blood sugar once daily and as needed.  Diagnosis:  E11.21  Non- insulin dependent. 100 each 3  . albuterol (PROVENTIL HFA;VENTOLIN HFA) 108 (90 Base) MCG/ACT inhaler Inhale 2 puffs into the lungs every 6 (six) hours as needed for wheezing or shortness of breath. 1 Inhaler 0  . Alcohol Swabs (B-D SINGLE USE SWABS REGULAR) PADS Use as instructed to clean area for glucose monitoring once daily and as needed.  Diagnosis:  E11.21  Non insulin-dependent 100 each 3  . aspirin EC 81 MG tablet Take 81 mg by mouth daily.    . Blood Glucose Calibration (ACCU-CHEK AVIVA) SOLN Use to calibrate blood glucose machine as recommended.  Diagnosis:  E11.21  Non-insulin dependent. 1 each 2  . Blood Glucose Monitoring Suppl (ACCU-CHEK AVIVA) device Use to check blood sugar once daily.  Dx: E11.21 1 each 0  . carvedilol (COREG) 6.25 MG tablet TAKE 1 TABLET TWICE DAILY WITH A MEAL 180 tablet 0  . furosemide (LASIX) 20 MG tablet Take 1 tablet (20 mg total) by mouth daily. 90 tablet 3  . gabapentin (NEURONTIN) 300 MG capsule Take 2 capsules (600 mg total) by mouth 2 (two) times daily. 360 capsule 0  . glipiZIDE (GLUCOTROL) 5 MG tablet TAKE 1 TABLET EVERY DAY BEFORE BREAKFAST 90  tablet 0  . glucose blood (ACCU-CHEK AVIVA) test strip Use to check blood sugar once daily.  DX: E11.21 100 each 3  . levofloxacin (LEVAQUIN) 500 MG tablet Take 1 tablet (500 mg total) by mouth daily. 7 tablet 0  . lisinopril (PRINIVIL,ZESTRIL) 2.5 MG tablet TAKE 1 TABLET EVERY DAY 90 tablet 0  . lovastatin (MEVACOR) 20 MG tablet TAKE 1 TABLET AT BEDTIME 90 tablet 0  . meloxicam (MOBIC) 15 MG tablet TAKE 1 TABLET EVERY DAY 90 tablet 1  . tamsulosin (FLOMAX) 0.4 MG CAPS capsule TAKE 1 CAPSULE EVERY DAY 90 capsule 0   No current facility-administered medications on file prior to visit.     No Known Allergies  Past Medical History:  Diagnosis Date  . AICD (automatic cardioverter/defibrillator) present 10/18/2016  . Anemia   . Arthritis    hip, hands- Osteoarthritis    . CAD (coronary artery disease)   . CKD (chronic kidney disease) stage 3, GFR 30-59 ml/min    [name]  . Dyslipidemia   . GERD (gastroesophageal reflux disease)   . Hypertension    g. taylor  . ICD (implantable cardiac defibrillator) in place    2009-St. Jude device  . Neuromuscular disorder (HCC)    neuropathy- in hands  . Pacemaker   . Shortness of breath   . Sleep apnea    started a sleep study  but didn't completed, states PCP told him he has sleep  apnea, doesn't use CPAP  . Systolic dysfunction   . Type 2 diabetes with nephropathy ([place])   . Vitamin D deficiency     Past Surgical History:  Procedure Laterality Date  . BACK SURGERY     lumbar & cervical fusion   . ICD IMPLANT N/A 10/18/2016   Procedure: ICD Implant- Upgrade to Dual Chamber;  Surgeon: Evans Lance, MD;  Location: [place] [facility];  Service: Cardiovascular;  Laterality: N/A;  . INSERT / REPLACE / REMOVE PACEMAKER    . left carpal tunnel release    . left rotator cuff surgery    . LUMBAR LAMINECTOMY Left 07/17/2013   Procedure: MICRODISCECTOMY LUMBAR LAMINECTOMY;  Surgeon: Marybelle Killings, MD;  Location: [place];  Service: Orthopedics;   Laterality: Left;  Left L4 Laminotomy, Lateral Recess Decompression, Cyst Excision  . TOTAL HIP ARTHROPLASTY Left 09/17/2012   Dr Lorin Mercy  . TOTAL HIP ARTHROPLASTY Left 09/17/2012   Procedure: Left TOTAL HIP ARTHROPLASTY ANTERIOR APPROACH;  Surgeon: Marybelle Killings, MD;  Location: [place];  Service: Orthopedics;  Laterality: Left;    Family History  Problem Relation Age of Onset  . Heart attack Father 70  . Diabetes Brother     Social History   Social History  . Marital status: Single    Spouse name: N/A  . Number of children: 2  . Years of education: N/A   Occupational History  . 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 Smoking status: Former Smoker    Types: Cigarettes    Quit date: 07/30/1984  . Smokeless tobacco: Never Used  . Alcohol use Yes     Comment: occ  . Drug use: No  . Sexual activity: No   Other Topics Concern  . Not on file   Social History Narrative   Does not have a living will.   Desires CPR, does want life support if not futile.         The PMH, PSH, Social History, Family History, Medications, and allergies have been reviewed in [facility], and have been updated if relevant.  Review of Systems  Constitutional: Negative.   Respiratory: Positive for cough, shortness of breath and wheezing. Negative for apnea, choking, chest tightness and stridor.   Cardiovascular: Negative.   All other systems reviewed and are negative.      Objective:    BP 108/64   Pulse 64   Temp 98.1 F (36.7 C) (Oral)   Wt 199 lb 8 oz (90.5 kg)   SpO2 95%   BMI 30.33 kg/m    Physical Exam  Constitutional: He appears well-developed and well-nourished. No distress.  HENT:  Head: Normocephalic and atraumatic.  Eyes: Conjunctivae are normal.  Cardiovascular: Normal rate and regular rhythm.   Pulmonary/Chest: He has wheezes. He has rhonchi.  Musculoskeletal: Normal range of motion.  Neurological: He is alert. No cranial nerve deficit.  Skin: Skin is  warm and dry. He is not diaphoretic.  Psychiatric: He has a normal mood and affect. His behavior is normal. Thought content normal.  Nursing note and vitals reviewed.         Assessment & Plan:   Bronchitis No Follow-up on file.

## 2017-03-19 ENCOUNTER — Ambulatory Visit (INDEPENDENT_AMBULATORY_CARE_PROVIDER_SITE_OTHER)
Admission: RE | Admit: 2017-03-19 | Discharge: 2017-03-19 | Disposition: A | Discharge status: Home / Self Care | Payer: Medicare HMO | Source: Ambulatory Visit | Attending: Family Medicine | Admitting: Family Medicine

## 2017-03-19 ENCOUNTER — Encounter: Payer: Self-pay | Admitting: Family Medicine

## 2017-03-19 ENCOUNTER — Ambulatory Visit (INDEPENDENT_AMBULATORY_CARE_PROVIDER_SITE_OTHER): Payer: Medicare HMO | Admitting: Family Medicine

## 2017-03-19 VITALS — BP 118/70 | HR 70 | Temp 98.3°F | Wt 197.1 lb

## 2017-03-19 DIAGNOSIS — J40 Bronchitis, not specified as acute or chronic: Secondary | ICD-10-CM | POA: Diagnosis not present

## 2017-03-19 DIAGNOSIS — R05 Cough: Secondary | ICD-10-CM | POA: Diagnosis not present

## 2017-03-19 MED ORDER — PREDNISONE 20 MG PO TABS: 40.0000 mg | ORAL_TABLET | Freq: Every day | ORAL | 0 refills | Status: DC

## 2017-03-19 NOTE — Progress Notes (Signed)
Subjective:   Patient ID: Randy [name], male    DOB: August 27, 1945, 71 y.o.   MRN: [id number]  Randy [name] is a pleasant 71 y.o. year old male who presents to clinic today with Nasal Congestion (about 3 months--no fever--no sore throat) and Cough (some)  on 03/19/2017  HPI:  Persistent cough despite two rounds of abx.  Chart reviewed-   I saw him on 02/11/17 for URI symptoms which were progressive over weeks. Wheezes and rhonchi on exam.  placed on levaquin and hycodan as needed for cough.  I then saw him again on 03/04/17- still wheezing on exam and productive cough was worsening. Given findings on exam and increased sputum production, placed on doxycyline and hycodan refilled.   Here today because he still is having nasal congestion, cough has improve but still having a cough and wheezing.   Current Outpatient Prescriptions on File Prior to Visit  Medication Sig Dispense Refill  . ACCU-CHEK SOFTCLIX LANCETS lancets Use as instructed to check blood sugar once daily and as needed.  Diagnosis:  E11.21  Non- insulin dependent. 100 each 3  . albuterol (PROVENTIL HFA;VENTOLIN HFA) 108 (90 Base) MCG/ACT inhaler Inhale 2 puffs into the lungs every 6 (six) hours as needed for wheezing or shortness of breath. 1 Inhaler 0  . Alcohol Swabs (B-D SINGLE USE SWABS REGULAR) PADS Use as instructed to clean area for glucose monitoring once daily and as needed.  Diagnosis:  E11.21  Non insulin-dependent 100 each 3  . aspirin EC 81 MG tablet Take 81 mg by mouth daily.    . Blood Glucose Calibration (ACCU-CHEK AVIVA) SOLN Use to calibrate blood glucose machine as recommended.  Diagnosis:  E11.21  Non-insulin dependent. 1 each 2  . Blood Glucose Monitoring Suppl (ACCU-CHEK AVIVA) device Use to check blood sugar once daily.  Dx: E11.21 1 each 0  . carvedilol (COREG) 6.25 MG tablet TAKE 1 TABLET TWICE DAILY WITH A MEAL 180 tablet 0  . furosemide (LASIX) 20 MG tablet Take 1 tablet (20 mg total) by mouth  daily. 90 tablet 3  . gabapentin (NEURONTIN) 300 MG capsule Take 2 capsules (600 mg total) by mouth 2 (two) times daily. 360 capsule 0  . glipiZIDE (GLUCOTROL) 5 MG tablet TAKE 1 TABLET EVERY DAY BEFORE BREAKFAST 90 tablet 0  . glucose blood (ACCU-CHEK AVIVA) test strip Use to check blood sugar once daily.  DX: E11.21 100 each 3  . HYDROcodone-homatropine (HYCODAN) 5-1.5 MG/5ML syrup Take 5 mLs by mouth every 8 (eight) hours as needed for cough. 120 mL 0  . lisinopril (PRINIVIL,ZESTRIL) 2.5 MG tablet TAKE 1 TABLET EVERY DAY 90 tablet 0  . lovastatin (MEVACOR) 20 MG tablet TAKE 1 TABLET AT BEDTIME 90 tablet 0  . meloxicam (MOBIC) 15 MG tablet TAKE 1 TABLET EVERY DAY 90 tablet 1  . tamsulosin (FLOMAX) 0.4 MG CAPS capsule TAKE 1 CAPSULE EVERY DAY 90 capsule 0   No current facility-administered medications on file prior to visit.     No Known Allergies  Past Medical History:  Diagnosis Date  . AICD (automatic cardioverter/defibrillator) present 10/18/2016  . Anemia   . Arthritis    hip, hands- Osteoarthritis    . CAD (coronary artery disease)   . CKD (chronic kidney disease) stage 3, GFR 30-59 ml/min    [name]  . Dyslipidemia   . GERD (gastroesophageal reflux disease)   . Hypertension    g. taylor  . ICD (implantable cardiac defibrillator) in place  2009-St. Jude device  . Neuromuscular disorder (HCC)    neuropathy- in hands  . Pacemaker   . Shortness of breath   . Sleep apnea    started a sleep study but didn't completed, states PCP told him he has sleep  apnea, doesn't use CPAP  . Systolic dysfunction   . Type 2 diabetes with nephropathy ([place])   . Vitamin D deficiency     Past Surgical History:  Procedure Laterality Date  . BACK SURGERY     lumbar & cervical fusion   . ICD IMPLANT N/A 10/18/2016   Procedure: ICD Implant- Upgrade to Dual Chamber;  Surgeon: Evans Lance, MD;  Location: [facility];  Service: Cardiovascular;  Laterality: N/A;  . INSERT / REPLACE /  REMOVE PACEMAKER    . left carpal tunnel release    . left rotator cuff surgery    . LUMBAR LAMINECTOMY Left 07/17/2013   Procedure: MICRODISCECTOMY LUMBAR LAMINECTOMY;  Surgeon: Marybelle Killings, MD;  Location: [facility];  Service: Orthopedics;  Laterality: Left;  Left L4 Laminotomy, Lateral Recess Decompression, Cyst Excision  . TOTAL HIP ARTHROPLASTY Left 09/17/2012   Dr Lorin Mercy  . TOTAL HIP ARTHROPLASTY Left 09/17/2012   Procedure: Left TOTAL HIP ARTHROPLASTY ANTERIOR APPROACH;  Surgeon: Marybelle Killings, MD;  Location: [facility];  Service: Orthopedics;  Laterality: Left;    Family History  Problem Relation Age of Onset  . Heart attack Father 76  . Diabetes Brother     Social History   Social History  . Marital status: Single    Spouse name: N/A  . Number of children: 2  . Years of education: N/A   Occupational History  . 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 Smoking status: Former Smoker    Types: Cigarettes    Quit date: 07/30/1984  . Smokeless tobacco: Never Used  . Alcohol use Yes     Comment: occ  . Drug use: No  . Sexual activity: No   Other Topics Concern  . Not on file   Social History Narrative   Does not have a living will.   Desires CPR, does want life support if not futile.         The PMH, PSH, Social History, Family History, Medications, and allergies have been reviewed in [facility], and have been updated if relevant.   Review of Systems  Constitutional: Negative for fever.  Respiratory: Positive for cough and wheezing. Negative for apnea, choking, chest tightness, shortness of breath and stridor.   Cardiovascular: Negative.   Gastrointestinal: Negative.   Musculoskeletal: Negative.   Neurological: Negative.   All other systems reviewed and are negative.      Objective:    BP 118/70   Pulse 70   Temp 98.3 F (36.8 C) (Oral)   Wt 197 lb 1.9 oz (89.4 kg)   SpO2 95%   BMI 29.97 kg/m    Physical Exam  Constitutional: He is  oriented to person, place, and time. He appears well-developed and well-nourished. No distress.  HENT:  Head: Atraumatic.  Eyes: Conjunctivae are normal.  Cardiovascular: Normal rate and regular rhythm.   Pulmonary/Chest: He has wheezes.  Neurological: He is alert and oriented to person, place, and time. No cranial nerve deficit.  Skin: Skin is warm and dry. He is not diaphoretic.  Psychiatric: He has a normal mood and affect. His behavior is normal. Judgment and thought content normal.  Nursing  note and vitals reviewed.         Assessment & Plan:   Bronchitis - Plan: DG Chest 2 View No Follow-up on file.

## 2017-03-19 NOTE — Assessment & Plan Note (Signed)
Lung exam overall improved but persistent wheezing. Will get CXR today. I doubt infectious etiology at this point. 5 day steroid burst for inflammation. May need pulmonary referral. The patient indicates understanding of these issues and agrees with the plan.

## 2017-03-19 NOTE — Patient Instructions (Signed)
Good to see you. I will call you with your xray results. Please take prednisone- two tablets daily for 5 days (with breakfast).

## 2017-03-21 ENCOUNTER — Other Ambulatory Visit: Payer: Self-pay

## 2017-03-21 ENCOUNTER — Other Ambulatory Visit: Payer: Self-pay | Admitting: Family Medicine

## 2017-03-21 MED ORDER — GABAPENTIN 300 MG PO CAPS: 600.0000 mg | ORAL_CAPSULE | Freq: Two times a day (BID) | ORAL | 0 refills | Status: DC

## 2017-03-21 NOTE — Telephone Encounter (Signed)
LMOM for patient to notified of Rx sent to pharmacy and to schedule and annual exam

## 2017-03-21 NOTE — Telephone Encounter (Signed)
Pt left v/m requesting 2 wks supply of gabapentin to [facility] [place] until receives mail order pharmacy.Please advise. Last refilled # 360 on 09/25/16. 03/19/16 last wellness exam.

## 2017-04-15 ENCOUNTER — Other Ambulatory Visit: Payer: Self-pay | Admitting: Family Medicine

## 2017-04-15 NOTE — Telephone Encounter (Signed)
Received refill electronically Last refill 11/27/16 #90/1 Last office visit 03/19/17

## 2017-04-18 ENCOUNTER — Other Ambulatory Visit: Payer: Self-pay | Admitting: Family Medicine

## 2017-04-18 DIAGNOSIS — N183 Chronic kidney disease, stage 3 unspecified: Secondary | ICD-10-CM

## 2017-04-18 DIAGNOSIS — Z125 Encounter for screening for malignant neoplasm of prostate: Secondary | ICD-10-CM

## 2017-04-18 DIAGNOSIS — E1121 Type 2 diabetes mellitus with diabetic nephropathy: Secondary | ICD-10-CM

## 2017-04-18 DIAGNOSIS — E0843 Diabetes mellitus due to underlying condition with diabetic autonomic (poly)neuropathy: Secondary | ICD-10-CM

## 2017-04-18 DIAGNOSIS — E785 Hyperlipidemia, unspecified: Secondary | ICD-10-CM

## 2017-04-22 ENCOUNTER — Other Ambulatory Visit: Payer: Self-pay | Admitting: Family Medicine

## 2017-04-22 ENCOUNTER — Ambulatory Visit (INDEPENDENT_AMBULATORY_CARE_PROVIDER_SITE_OTHER): Payer: Medicare HMO

## 2017-04-22 VITALS — BP 116/60 | HR 55 | Temp 97.9°F | Ht 67.0 in | Wt 194.8 lb

## 2017-04-22 DIAGNOSIS — Z23 Encounter for immunization: Secondary | ICD-10-CM

## 2017-04-22 DIAGNOSIS — E0843 Diabetes mellitus due to underlying condition with diabetic autonomic (poly)neuropathy: Secondary | ICD-10-CM

## 2017-04-22 DIAGNOSIS — E1121 Type 2 diabetes mellitus with diabetic nephropathy: Secondary | ICD-10-CM | POA: Diagnosis not present

## 2017-04-22 DIAGNOSIS — Z125 Encounter for screening for malignant neoplasm of prostate: Secondary | ICD-10-CM | POA: Diagnosis not present

## 2017-04-22 DIAGNOSIS — N183 Chronic kidney disease, stage 3 unspecified: Secondary | ICD-10-CM

## 2017-04-22 DIAGNOSIS — Z0000 Encounter for general adult medical examination without abnormal findings: Secondary | ICD-10-CM

## 2017-04-22 DIAGNOSIS — E785 Hyperlipidemia, unspecified: Secondary | ICD-10-CM

## 2017-04-22 LAB — COMPREHENSIVE METABOLIC PANEL
ALK PHOS: 44 U/L (ref 39–117)
ALT: 11 U/L (ref 0–53)
AST: 14 U/L (ref 0–37)
Albumin: 4.5 g/dL (ref 3.5–5.2)
BILIRUBIN TOTAL: 0.5 mg/dL (ref 0.2–1.2)
BUN: 39 mg/dL — ABNORMAL HIGH (ref 6–23)
CALCIUM: 9.4 mg/dL (ref 8.4–10.5)
CO2: 28 mEq/L (ref 19–32)
CREATININE: 1.94 mg/dL — AB (ref 0.40–1.50)
Chloride: 100 mEq/L (ref 96–112)
GFR: 36.39 mL/min — ABNORMAL LOW (ref >60.00)
GLUCOSE: 73 mg/dL (ref 70–99)
Potassium: 5.3 mEq/L — ABNORMAL HIGH (ref 3.5–5.1)
Sodium: 137 mEq/L (ref 135–145)
TOTAL PROTEIN: 7 g/dL (ref 6.0–8.3)

## 2017-04-22 LAB — LIPID PANEL
Cholesterol: 198 mg/dL (ref 0–200)
HDL: 28.8 mg/dL — ABNORMAL LOW (ref >39.00)
Total CHOL/HDL Ratio: 7
Triglycerides: 426 mg/dL — ABNORMAL HIGH (ref 0.0–149.0)

## 2017-04-22 LAB — HEMOGLOBIN A1C: Hgb A1c MFr Bld: 7.4 % — ABNORMAL HIGH (ref 4.6–6.5)

## 2017-04-22 LAB — LDL CHOLESTEROL, DIRECT: LDL DIRECT: 97 mg/dL

## 2017-04-22 LAB — PSA: PSA: 1.11 ng/mL (ref 0.10–4.00)

## 2017-04-22 NOTE — Progress Notes (Signed)
PCP notes:   Health maintenance:  Foot exam - PCP please address at next appt Eye exam - addressed; future appt scheduled A1C - completed Flu vaccine- administered  Abnormal screenings:   Mini-Cog score: 19/20 Depression score: 2 Hearing - failed  Hearing Screening   125Hz  250Hz  500Hz  1000Hz  2000Hz  3000Hz  4000Hz  6000Hz  8000Hz   Right ear:   40 40 40  0    Left ear:   0 0 40  0      Patient concerns:   None  Nurse concerns:  None  Next PCP appt:   05/07/17 @ 0830

## 2017-04-22 NOTE — Progress Notes (Signed)
Pre visit review using our clinic review tool, if applicable. No additional management support is needed unless otherwise documented below in the visit note. 

## 2017-04-22 NOTE — Progress Notes (Signed)
Subjective:   Randy [name] is a 71 y.o. male who presents for Medicare Annual/Subsequent preventive examination.  Review of Systems:  N/A Cardiac Risk Factors include: advanced age (>74men, >68 women);male gender;obesity (BMI >30kg/m2);diabetes mellitus;dyslipidemia;hypertension     Objective:    Vitals: BP 116/60 (BP Location: Right Arm, Patient Position: Sitting, Cuff Size: Normal)   Pulse (!) 55   Temp 97.9 F (36.6 C) (Oral)   Ht 5\' 7"  (1.702 m) Comment: no shoes  Wt 194 lb 12 oz (88.3 kg)   SpO2 97%   BMI 30.50 kg/m   Body mass index is 30.5 kg/m.  Tobacco History  Smoking Status  . Former Smoker  . Types: Cigarettes  . Quit date: 07/30/1984  Smokeless Tobacco  . Never Used     Counseling given: No   Past Medical History:  Diagnosis Date  . AICD (automatic cardioverter/defibrillator) present 10/18/2016  . Anemia   . Arthritis    hip, hands- Osteoarthritis    . CAD (coronary artery disease)   . CKD (chronic kidney disease) stage 3, GFR 30-59 ml/min    [name]  . Dyslipidemia   . GERD (gastroesophageal reflux disease)   . Hypertension    g. taylor  . ICD (implantable cardiac defibrillator) in place    2009-St. Jude device  . Neuromuscular disorder (HCC)    neuropathy- in hands  . Pacemaker   . Shortness of breath   . Sleep apnea    started a sleep study but didn't completed, states PCP told him he has sleep  apnea, doesn't use CPAP  . Systolic dysfunction   . Type 2 diabetes with nephropathy ([place])   . Vitamin D deficiency    Past Surgical History:  Procedure Laterality Date  . BACK SURGERY     lumbar & cervical fusion   . ICD IMPLANT N/A 10/18/2016   Procedure: ICD Implant- Upgrade to Dual Chamber;  Surgeon: Evans Lance, MD;  Location: [facility];  Service: Cardiovascular;  Laterality: N/A;  . INSERT / REPLACE / REMOVE PACEMAKER    . left carpal tunnel release    . left rotator cuff surgery    . LUMBAR LAMINECTOMY Left 07/17/2013   Procedure: MICRODISCECTOMY LUMBAR LAMINECTOMY;  Surgeon: Marybelle Killings, MD;  Location: [place];  Service: Orthopedics;  Laterality: Left;  Left L4 Laminotomy, Lateral Recess Decompression, Cyst Excision  . TOTAL HIP ARTHROPLASTY Left 09/17/2012   Dr Lorin Mercy  . TOTAL HIP ARTHROPLASTY Left 09/17/2012   Procedure: Left TOTAL HIP ARTHROPLASTY ANTERIOR APPROACH;  Surgeon: Marybelle Killings, MD;  Location: [place] [place];  Service: Orthopedics;  Laterality: Left;   Family History  Problem Relation Age of Onset  . Heart attack Father 68  . Diabetes Brother    History  Sexual Activity  . Sexual activity: No    Outpatient Encounter Prescriptions as of 04/22/2017  Medication Sig  . ACCU-CHEK SOFTCLIX LANCETS lancets Use as instructed to check blood sugar once daily and as needed.  Diagnosis:  E11.21  Non- insulin dependent.  Marland Kitchen albuterol (PROVENTIL HFA;VENTOLIN HFA) 108 (90 Base) MCG/ACT inhaler Inhale 2 puffs into the lungs every 6 (six) hours as needed for wheezing or shortness of breath.  . Alcohol Swabs (B-D SINGLE USE SWABS REGULAR) PADS Use as instructed to clean area for glucose monitoring once daily and as needed.  Diagnosis:  E11.21  Non insulin-dependent  . aspirin EC 81 MG tablet Take 81 mg by mouth daily.  . Blood Glucose  Calibration (ACCU-CHEK AVIVA) SOLN Use to calibrate blood glucose machine as recommended.  Diagnosis:  E11.21  Non-insulin dependent.  . Blood Glucose Monitoring Suppl (ACCU-CHEK AVIVA) device Use to check blood sugar once daily.  Dx: E11.21  . furosemide (LASIX) 20 MG tablet Take 1 tablet (20 mg total) by mouth daily.  Marland Kitchen gabapentin (NEURONTIN) 300 MG capsule Take 2 capsules (600 mg total) by mouth 2 (two) times daily.  Marland Kitchen glucose blood (ACCU-CHEK AVIVA) test strip Use to check blood sugar once daily.  DX: E11.21  . meloxicam (MOBIC) 15 MG tablet TAKE 1 TABLET EVERY DAY  . [DISCONTINUED] carvedilol (COREG) 6.25 MG tablet TAKE 1 TABLET TWICE DAILY WITH A MEAL  . [DISCONTINUED] glipiZIDE  (GLUCOTROL) 5 MG tablet TAKE 1 TABLET EVERY DAY BEFORE BREAKFAST  . [DISCONTINUED] HYDROcodone-homatropine (HYCODAN) 5-1.5 MG/5ML syrup Take 5 mLs by mouth every 8 (eight) hours as needed for cough.  . [DISCONTINUED] lisinopril (PRINIVIL,ZESTRIL) 2.5 MG tablet TAKE 1 TABLET EVERY DAY  . [DISCONTINUED] lovastatin (MEVACOR) 20 MG tablet TAKE 1 TABLET AT BEDTIME  . [DISCONTINUED] predniSONE (DELTASONE) 20 MG tablet Take 2 tablets (40 mg total) by mouth daily with breakfast.  . [DISCONTINUED] tamsulosin (FLOMAX) 0.4 MG CAPS capsule TAKE 1 CAPSULE EVERY DAY   No facility-administered encounter medications on file as of 04/22/2017.     Activities of Daily Living In your present state of health, do you have any difficulty performing the following activities: 04/22/2017 10/18/2016  Hearing? N -  Vision? Y -  Difficulty concentrating or making decisions? Y -  Walking or climbing stairs? Y -  Comment - -  Dressing or bathing? N -  Doing errands, shopping? N N  Preparing Food and eating ? N -  Using the Toilet? N -  In the past six months, have you accidently leaked urine? N -  Do you have problems with loss of bowel control? N -  Managing your Medications? N -  Managing your Finances? N -  Housekeeping or managing your Housekeeping? N -  Some recent data might be hidden    Patient Care Team: Lucille Passy, MD as PCP - General Evans Lance, MD as Consulting Physician (Cardiology) Marybelle Killings, MD as Consulting Physician (Orthopedic Surgery)   Assessment:     Hearing Screening   125Hz  250Hz  500Hz  1000Hz  2000Hz  3000Hz  4000Hz  6000Hz  8000Hz   Right ear:   40 40 40  0    Left ear:   0 0 40  0    Vision Screening Comments: Future appt scheduled on 04/24/17   Exercise Activities and Dietary recommendations Current Exercise Habits: Home exercise routine, Type of exercise: Other - see comments (golf 4-5 hrs/1 day per week), Time (Minutes): > 60, Frequency (Times/Week): 1, Weekly Exercise  (Minutes/Week): 0, Intensity: Moderate, Exercise limited by: None identified  Goals    . Increase physical activity          Starting 04/22/2017, I will continue to play golf 4-5 hours weekly.       Fall Risk Fall Risk  04/22/2017 03/09/2016 06/22/2014  Falls in the past year? No No No   Depression Screen PHQ 2/9 Scores 04/22/2017 03/09/2016 06/22/2014 06/02/2012  PHQ - 2 Score 0 0 0 0  PHQ- 9 Score 2 - - -    Cognitive Function MMSE - Mini Mental State Exam 04/22/2017 03/09/2016  Orientation to time 5 5  Orientation to Place 5 5  Registration 3 3  Attention/ Calculation 0 0  Recall 2 3  Recall-comments unable to recall 1 of 3 words -  Language- name 2 objects 0 0  Language- repeat 1 1  Language- follow 3 step command 3 3  Language- read & follow direction 0 0  Write a sentence 0 0  Copy design 0 0  Total score 19 20        Immunization History  Administered Date(s) Administered  . Influenza Split 06/02/2012  . Influenza Whole 05/18/2010  . Influenza,inj,Quad PF,6+ Mos 04/23/2013, 03/22/2014, 07/07/2015, 06/13/2016, 04/22/2017  . Pneumococcal Conjugate-13 06/22/2014  . Pneumococcal Polysaccharide-23 03/09/2016  . Td 01/14/2008  . Zoster 01/11/2010   Screening Tests Health Maintenance  Topic Date Due  . FOOT EXAM  04/22/2018 (Originally 03/02/2016)  . OPHTHALMOLOGY EXAM  04/22/2018 (Originally 05/31/2015)  . HEMOGLOBIN A1C  10/20/2017  . TETANUS/TDAP  01/13/2018  . COLONOSCOPY  02/07/2020  . INFLUENZA VACCINE  Completed  . Hepatitis C Screening  Completed  . PNA vac Low Risk Adult  Completed      Plan:     I have personally reviewed and addressed the Medicare Annual Wellness questionnaire and have noted the following in the patient's chart:  A. Medical and social history B. Use of alcohol, tobacco or illicit drugs  C. Current medications and supplements D. Functional ability and status E.  Nutritional status F.  Physical activity G. Advance  directives H. List of other physicians I.  Hospitalizations, surgeries, and ER visits in previous 12 months J.  [place] to include hearing, vision, cognitive, depression L. Referrals and appointments - none  In addition, I have reviewed and discussed with patient certain preventive protocols, quality metrics, and best practice recommendations. A written personalized care plan for preventive services as well as general preventive health recommendations were provided to patient.  See attached scanned questionnaire for additional information.   Signed,   Lindell Noe, MHA, BS, LPN Health Coach

## 2017-04-22 NOTE — Patient Instructions (Signed)
Randy [name] , Thank you for taking time to come for your Medicare Wellness Visit. I appreciate your ongoing commitment to your health goals. Please review the following plan we discussed and let me know if I can assist you in the future.   These are the goals we discussed: Goals    . Increase physical activity          Starting 04/22/2017, I will continue to play golf 4-5 hours weekly.        This is a list of the screening recommended for you and due dates:  Health Maintenance  Topic Date Due  . Complete foot exam   04/22/2018*  . Eye exam for diabetics  04/22/2018*  . Hemoglobin A1C  10/20/2017  . Tetanus Vaccine  01/13/2018  . Colon Cancer Screening  02/07/2020  . Flu Shot  Completed  .  Hepatitis C: One time screening is recommended by Center for Disease Control  (CDC) for  adults born from [birth year] through 1965.   Completed  . Pneumonia vaccines  Completed  *Topic was postponed. The date shown is not the original due date.   Preventive Care for Adults  A healthy lifestyle and preventive care can promote health and wellness. Preventive health guidelines for adults include the following key practices.  . A routine yearly physical is a good way to check with your health care provider about your health and preventive screening. It is a chance to share any concerns and updates on your health and to receive a thorough exam.  . Visit your dentist for a routine exam and preventive care every 6 months. Brush your teeth twice a day and floss once a day. Good oral hygiene prevents tooth decay and gum disease.  . The frequency of eye exams is based on your age, health, family medical history, use  of contact lenses, and other factors. Follow your health care provider's ecommendations for frequency of eye exams.  . Eat a healthy diet. Foods like vegetables, fruits, whole grains, low-fat dairy products, and lean protein foods contain the nutrients you need without too many calories. Decrease  your intake of foods high in solid fats, added sugars, and salt. Eat the right amount of calories for you. Get information about a proper diet from your health care provider, if necessary.  . Regular physical exercise is one of the most important things you can do for your health. Most adults should get at least 150 minutes of moderate-intensity exercise (any activity that increases your heart rate and causes you to sweat) each week. In addition, most adults need muscle-strengthening exercises on 2 or more days a week.  Silver Sneakers may be a benefit available to you. To determine eligibility, you may visit the website: www.silversneakers.com or contact program at (612) 241-2122 Mon-Fri between 8AM-8PM.   . Maintain a healthy weight. The body mass index (BMI) is a screening tool to identify possible weight problems. It provides an estimate of body fat based on height and weight. Your health care provider can find your BMI and can help you achieve or maintain a healthy weight.   For adults 20 years and older: ? A BMI below 18.5 is considered underweight. ? A BMI of 18.5 to 24.9 is normal. ? A BMI of 25 to 29.9 is considered overweight. ? A BMI of 30 and above is considered obese.   . Maintain normal blood lipids and cholesterol levels by exercising and minimizing your intake of saturated fat. Eat a balanced diet  with plenty of fruit and vegetables. Blood tests for lipids and cholesterol should begin at age 55 and be repeated every 5 years. If your lipid or cholesterol levels are high, you are over 50, or you are at high risk for heart disease, you may need your cholesterol levels checked more frequently. Ongoing high lipid and cholesterol levels should be treated with medicines if diet and exercise are not working.  . If you smoke, find out from your health care provider how to quit. If you do not use tobacco, please do not start.  . If you choose to drink alcohol, please do not consume more than  2 drinks per day. One drink is considered to be 12 ounces (355 mL) of beer, 5 ounces (148 mL) of wine, or 1.5 ounces (44 mL) of liquor.  . If you are 72-54 years old, ask your health care provider if you should take aspirin to prevent strokes.  . Use sunscreen. Apply sunscreen liberally and repeatedly throughout the day. You should seek shade when your shadow is shorter than you. Protect yourself by wearing long sleeves, pants, a wide-brimmed hat, and sunglasses year round, whenever you are outdoors.  . Once a month, do a whole body skin exam, using a mirror to look at the skin on your back. Tell your health care provider of new moles, moles that have irregular borders, moles that are larger than a pencil eraser, or moles that have changed in shape or color.

## 2017-04-23 NOTE — Progress Notes (Signed)
I reviewed health advisor's note, was available for consultation, and agree with documentation and plan.  

## 2017-04-24 ENCOUNTER — Other Ambulatory Visit: Payer: Self-pay

## 2017-04-24 DIAGNOSIS — H524 Presbyopia: Secondary | ICD-10-CM | POA: Diagnosis not present

## 2017-04-24 DIAGNOSIS — E119 Type 2 diabetes mellitus without complications: Secondary | ICD-10-CM | POA: Diagnosis not present

## 2017-04-24 LAB — HM DIABETES EYE EXAM

## 2017-04-24 MED ORDER — GLIPIZIDE 5 MG PO TABS: ORAL_TABLET | ORAL | 0 refills | Status: DC

## 2017-04-24 MED ORDER — MELOXICAM 15 MG PO TABS: 15.0000 mg | ORAL_TABLET | Freq: Every day | ORAL | 1 refills | Status: DC

## 2017-04-24 MED ORDER — TAMSULOSIN HCL 0.4 MG PO CAPS: 0.4000 mg | ORAL_CAPSULE | Freq: Every day | ORAL | 0 refills | Status: DC

## 2017-04-24 MED ORDER — FUROSEMIDE 20 MG PO TABS: 20.0000 mg | ORAL_TABLET | Freq: Every day | ORAL | 3 refills | Status: DC

## 2017-04-24 MED ORDER — LOVASTATIN 20 MG PO TABS: 20.0000 mg | ORAL_TABLET | Freq: Every day | ORAL | 0 refills | Status: DC

## 2017-04-24 MED ORDER — CARVEDILOL 6.25 MG PO TABS: ORAL_TABLET | ORAL | 0 refills | Status: DC

## 2017-04-24 MED ORDER — LISINOPRIL 2.5 MG PO TABS: 2.5000 mg | ORAL_TABLET | Freq: Every day | ORAL | 0 refills | Status: DC

## 2017-04-24 NOTE — Telephone Encounter (Signed)
Patient in today for AWV. Needs refill of gabapentin. Approved by Dr. Deborra Medina.

## 2017-04-24 NOTE — Telephone Encounter (Signed)
Advised patient that Gabapentin is not covered by insurance. Due to new medication recommended, medication refill note being forwarded to PCP for review.   Additionally, patient has requested refills of all medications.

## 2017-05-07 ENCOUNTER — Encounter: Payer: Self-pay | Admitting: Family Medicine

## 2017-05-07 ENCOUNTER — Ambulatory Visit (INDEPENDENT_AMBULATORY_CARE_PROVIDER_SITE_OTHER): Payer: Medicare HMO | Admitting: Family Medicine

## 2017-05-07 VITALS — BP 116/80 | HR 59 | Temp 97.7°F | Ht 68.0 in | Wt 197.8 lb

## 2017-05-07 DIAGNOSIS — R0609 Other forms of dyspnea: Secondary | ICD-10-CM

## 2017-05-07 DIAGNOSIS — I10 Essential (primary) hypertension: Secondary | ICD-10-CM

## 2017-05-07 DIAGNOSIS — I5022 Chronic systolic (congestive) heart failure: Secondary | ICD-10-CM | POA: Diagnosis not present

## 2017-05-07 DIAGNOSIS — N183 Chronic kidney disease, stage 3 unspecified: Secondary | ICD-10-CM

## 2017-05-07 DIAGNOSIS — E785 Hyperlipidemia, unspecified: Secondary | ICD-10-CM

## 2017-05-07 DIAGNOSIS — Z0001 Encounter for general adult medical examination with abnormal findings: Secondary | ICD-10-CM

## 2017-05-07 DIAGNOSIS — J449 Chronic obstructive pulmonary disease, unspecified: Secondary | ICD-10-CM | POA: Insufficient documentation

## 2017-05-07 DIAGNOSIS — Z0000 Encounter for general adult medical examination without abnormal findings: Secondary | ICD-10-CM | POA: Insufficient documentation

## 2017-05-07 DIAGNOSIS — E1121 Type 2 diabetes mellitus with diabetic nephropathy: Secondary | ICD-10-CM | POA: Diagnosis not present

## 2017-05-07 LAB — CBC WITH DIFFERENTIAL/PLATELET
BASOS ABS: 0.1 10*3/uL (ref 0.0–0.1)
BASOS PCT: 0.9 % (ref 0.0–3.0)
EOS ABS: 0.4 10*3/uL (ref 0.0–0.7)
Eosinophils Relative: 6.8 % — ABNORMAL HIGH (ref 0.0–5.0)
HCT: 35.3 % — ABNORMAL LOW (ref 39.0–52.0)
HEMOGLOBIN: 11.4 g/dL — AB (ref 13.0–17.0)
LYMPHS PCT: 21.6 % (ref 12.0–46.0)
Lymphs Abs: 1.4 10*3/uL (ref 0.7–4.0)
MCHC: 32.4 g/dL (ref 30.0–36.0)
MCV: 88.8 fl (ref 78.0–100.0)
MONO ABS: 0.6 10*3/uL (ref 0.1–1.0)
Monocytes Relative: 9.6 % (ref 3.0–12.0)
Neutro Abs: 3.8 10*3/uL (ref 1.4–7.7)
Neutrophils Relative %: 61.1 % (ref 43.0–77.0)
Platelets: 154 10*3/uL (ref 150.0–400.0)
RBC: 3.97 Mil/uL — ABNORMAL LOW (ref 4.22–5.81)
RDW: 13.4 % (ref 11.5–15.5)
WBC: 6.3 10*3/uL (ref 4.0–10.5)

## 2017-05-07 NOTE — Assessment & Plan Note (Signed)
Reviewed preventive care protocols, scheduled due services, and updated immunizations Discussed nutrition, exercise, diet, and healthy lifestyle.  

## 2017-05-07 NOTE — Assessment & Plan Note (Signed)
New- lung exam reassuring, recent neg CXR. Will check CBC today and advised calling Dr. Lovena Le to rule out cardiac/ICD issue. The patient indicates understanding of these issues and agrees with the plan.

## 2017-05-07 NOTE — Patient Instructions (Signed)
Great to see you.  We will call you with your lab results from today.  Please call Dr. Lovena Le.  Please cut back on sugary drinks.

## 2017-05-07 NOTE — Assessment & Plan Note (Signed)
LDL at goal 

## 2017-05-07 NOTE — Progress Notes (Signed)
Subjective:    Patient ID: Randy [name], male    DOB: [birth year], 71 y.o.   MRN: [id number]  72 yo pleasant male here for CPX and follow up of chronic medical conditions.  Annual Medicare wellness visit with Candis Musa, RN on 04/22/17- notes reviewed.  His cough is better but he has noticed some persistent DOE- even walking from room to room.  No CP. No dizziness.  CHF with ICD- last saw Dr. Lovena Le on 01/24/17. Note reviewed.  DM- on glipizide 5 mg daily.  Deteriorated. Admits to drinking sweet tea. Checks fasting FSBS three times weekly- usually 200s. No recent h/o hypoglycemia. Lab Results  Component Value Date   HGBA1C 7.4 (H) 04/22/2017   On lisinopril 2.5 mg daily.  Wt Readings from Last 3 Encounters:  05/07/17 197 lb 12.8 oz (89.7 kg)  04/22/17 194 lb 12 oz (88.3 kg)  03/19/17 197 lb 1.9 oz (89.4 kg)   HLD- improved since last year.  Takes Mevacor 20 mg daily along with Niaspan.   Lab Results  Component Value Date   CHOL 198 04/22/2017   HDL 28.80 (L) 04/22/2017   LDLCALC 115 (H) 03/09/2016   LDLDIRECT 97.0 04/22/2017   TRIG (H) 04/22/2017    426.0 Triglyceride is over 400; calculations on Lipids are invalid.   CHOLHDL 7 04/22/2017    Wt Readings from Last 3 Encounters:  05/07/17 197 lb 12.8 oz (89.7 kg)  04/22/17 194 lb 12 oz (88.3 kg)  03/19/17 197 lb 1.9 oz (89.4 kg)    Lab Results  Component Value Date   CREATININE 1.94 (H) 04/22/2017    Lab Results  Component Value Date   CHOL 198 04/22/2017   HDL 28.80 (L) 04/22/2017   LDLCALC 115 (H) 03/09/2016   LDLDIRECT 97.0 04/22/2017   TRIG (H) 04/22/2017    426.0 Triglyceride is over 400; calculations on Lipids are invalid.   CHOLHDL 7 04/22/2017    Lab Results  Component Value Date   PSA 1.11 04/22/2017   PSA 1.01 03/09/2016   PSA 1.34 06/15/2014    Current Outpatient Prescriptions on File Prior to Visit  Medication Sig Dispense Refill  . ACCU-CHEK SOFTCLIX LANCETS lancets Use as  instructed to check blood sugar once daily and as needed.  Diagnosis:  E11.21  Non- insulin dependent. 100 each 3  . albuterol (PROVENTIL HFA;VENTOLIN HFA) 108 (90 Base) MCG/ACT inhaler Inhale 2 puffs into the lungs every 6 (six) hours as needed for wheezing or shortness of breath. 1 Inhaler 0  . Alcohol Swabs (B-D SINGLE USE SWABS REGULAR) PADS Use as instructed to clean area for glucose monitoring once daily and as needed.  Diagnosis:  E11.21  Non insulin-dependent 100 each 3  . aspirin EC 81 MG tablet Take 81 mg by mouth daily.    . Blood Glucose Calibration (ACCU-CHEK AVIVA) SOLN Use to calibrate blood glucose machine as recommended.  Diagnosis:  E11.21  Non-insulin dependent. 1 each 2  . Blood Glucose Monitoring Suppl (ACCU-CHEK AVIVA) device Use to check blood sugar once daily.  Dx: E11.21 1 each 0  . carvedilol (COREG) 6.25 MG tablet TAKE 1 TABLET TWICE DAILY WITH A MEAL 180 tablet 0  . furosemide (LASIX) 20 MG tablet Take 1 tablet (20 mg total) by mouth daily. 90 tablet 3  . gabapentin (NEURONTIN) 300 MG capsule Take 2 capsules (600 mg total) by mouth 2 (two) times daily. 60 capsule 0  . glipiZIDE (GLUCOTROL) 5 MG tablet TAKE 1 TABLET EVERY  DAY BEFORE BREAKFAST 90 tablet 0  . glucose blood (ACCU-CHEK AVIVA) test strip Use to check blood sugar once daily.  DX: E11.21 100 each 3  . lisinopril (PRINIVIL,ZESTRIL) 2.5 MG tablet Take 1 tablet (2.5 mg total) by mouth daily. 90 tablet 0  . lovastatin (MEVACOR) 20 MG tablet Take 1 tablet (20 mg total) by mouth at bedtime. 90 tablet 0  . meloxicam (MOBIC) 15 MG tablet Take 1 tablet (15 mg total) by mouth daily. 90 tablet 1  . tamsulosin (FLOMAX) 0.4 MG CAPS capsule Take 1 capsule (0.4 mg total) by mouth daily. 90 capsule 0   No current facility-administered medications on file prior to visit.     No Known Allergies  Past Medical History:  Diagnosis Date  . AICD (automatic cardioverter/defibrillator) present 10/18/2016  . Anemia   . Arthritis     hip, hands- Osteoarthritis    . CAD (coronary artery disease)   . CKD (chronic kidney disease) stage 3, GFR 30-59 ml/min (HCC)    [name]  . Dyslipidemia   . GERD (gastroesophageal reflux disease)   . Hypertension    g. taylor  . ICD (implantable cardiac defibrillator) in place    2009-St. Jude device  . Neuromuscular disorder (HCC)    neuropathy- in hands  . Pacemaker   . Shortness of breath   . Sleep apnea    started a sleep study but didn't completed, states PCP told him he has sleep  apnea, doesn't use CPAP  . Systolic dysfunction   . Type 2 diabetes with nephropathy ([place])   . Vitamin D deficiency     Past Surgical History:  Procedure Laterality Date  . BACK SURGERY     lumbar & cervical fusion   . ICD IMPLANT N/A 10/18/2016   Procedure: ICD Implant- Upgrade to Dual Chamber;  Surgeon: Evans Lance, MD;  Location: [facility] [facility];  Service: Cardiovascular;  Laterality: N/A;  . INSERT / REPLACE / REMOVE PACEMAKER    . left carpal tunnel release    . left rotator cuff surgery    . LUMBAR LAMINECTOMY Left 07/17/2013   Procedure: MICRODISCECTOMY LUMBAR LAMINECTOMY;  Surgeon: Marybelle Killings, MD;  Location: [facility];  Service: Orthopedics;  Laterality: Left;  Left L4 Laminotomy, Lateral Recess Decompression, Cyst Excision  . TOTAL HIP ARTHROPLASTY Left 09/17/2012   Dr Lorin Mercy  . TOTAL HIP ARTHROPLASTY Left 09/17/2012   Procedure: Left TOTAL HIP ARTHROPLASTY ANTERIOR APPROACH;  Surgeon: Marybelle Killings, MD;  Location: [facility];  Service: Orthopedics;  Laterality: Left;    Family History  Problem Relation Age of Onset  . Heart attack Father 41  . Diabetes Brother     Social History   Social History  . Marital status: Single    Spouse name: N/A  . Number of children: 2  . Years of education: N/A   Occupational History  . 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 Smoking status: Former Smoker    Types: Cigarettes    Quit date: 07/30/1984  . Smokeless  tobacco: Never Used  . Alcohol use Yes     Comment: occ  . Drug use: No  . Sexual activity: No   Other Topics Concern  . Not on file   Social History Narrative   Does not have a living will.   Desires CPR, does want life support if not futile.         The PMH,  PSH, Social History, Family History, Medications, and allergies have been reviewed in [facility], and have been updated if relevant.       Review of Systems  Constitutional: Negative.   HENT: Negative.   Eyes: Negative.   Respiratory: Positive for shortness of breath. Negative for cough.   Cardiovascular: Negative.  Negative for chest pain, palpitations and leg swelling.  Gastrointestinal: Negative.   Endocrine: Negative.   Genitourinary: Negative.   Musculoskeletal: Positive for arthralgias. Negative for back pain.  Skin: Negative.   Allergic/Immunologic: Negative.   Neurological: Negative.   Hematological: Negative.   Psychiatric/Behavioral: Negative.   All other systems reviewed and are negative.   Objective:   Physical Exam BP 116/80 (BP Location: Right Arm, Patient Position: Sitting, Cuff Size: Normal)   Pulse (!) 59   Temp 97.7 F (36.5 C) (Oral)   Ht 5\' 8"  (1.727 m)   Wt 197 lb 12.8 oz (89.7 kg)   SpO2 98%   BMI 30.08 kg/m  General:  pleasant male in no acute distress Eyes:  PERRL Ears:  External ear exam shows no significant lesions or deformities.  TMs normal bilaterally Hearing is grossly normal bilaterally. Nose:  External nasal examination shows no deformity or inflammation. Nasal mucosa are pink and moist without lesions or exudates. Mouth:  Oral mucosa and oropharynx without lesions or exudates.  Teeth in good repair. Neck:  no carotid bruit or thyromegaly no cervical or supraclavicular lymphadenopathy  Lungs:  Normal respiratory effort, chest expands symmetrically. Lungs are clear to auscultation, no crackles or wheezes. Heart:  Normal rate and regular rhythm. S1 and S2 normal without gallop,  murmur, click, rub or other extra sounds. Abdomen:  Bowel sounds positive,abdomen soft and non-tender without masses, organomegaly or hernias noted. Pulses:  R and L posterior tibial pulses are full and equal bilaterally  Psych:  Good eye contact, not anxious or depressed appearing Ext:  +enlarged DIPs and PIP with deformities bilaterally      Assessment & Plan:

## 2017-05-07 NOTE — Assessment & Plan Note (Signed)
Deteriorated. He would like to work on diet and follow up a1c in 3 months.

## 2017-05-21 ENCOUNTER — Other Ambulatory Visit: Payer: Self-pay | Admitting: Family Medicine

## 2017-05-21 DIAGNOSIS — K921 Melena: Secondary | ICD-10-CM

## 2017-05-21 DIAGNOSIS — D649 Anemia, unspecified: Secondary | ICD-10-CM

## 2017-05-22 ENCOUNTER — Encounter: Payer: Self-pay | Admitting: *Deleted

## 2017-05-23 ENCOUNTER — Encounter: Payer: Self-pay | Admitting: Gastroenterology

## 2017-05-23 ENCOUNTER — Encounter (INDEPENDENT_AMBULATORY_CARE_PROVIDER_SITE_OTHER): Payer: Self-pay

## 2017-05-23 ENCOUNTER — Ambulatory Visit (INDEPENDENT_AMBULATORY_CARE_PROVIDER_SITE_OTHER): Payer: Medicare HMO | Admitting: Gastroenterology

## 2017-05-23 VITALS — BP 90/50 | HR 60 | Ht 67.0 in | Wt 195.2 lb

## 2017-05-23 DIAGNOSIS — R198 Other specified symptoms and signs involving the digestive system and abdomen: Secondary | ICD-10-CM

## 2017-05-23 DIAGNOSIS — D631 Anemia in chronic kidney disease: Secondary | ICD-10-CM | POA: Diagnosis not present

## 2017-05-23 DIAGNOSIS — N189 Chronic kidney disease, unspecified: Secondary | ICD-10-CM

## 2017-05-23 NOTE — Progress Notes (Signed)
[facility] Consult Note:  History: Randy [name] 05/23/2017  Referring physician: Lucille Passy, MD  Reason for consult/chief complaint: Melena (for 1 week but none now); Anemia; and Constipation (have a hard time getting started for a few minutes)   Subjective  HPI:  This is a 71 year old man referred by primary care noted above for anemia and dark stool. After a recent primary care appointment, he was noted to have a modest drop in hemoglobin compared to his baseline. He apparently reported to his doctor that he had had some dark stool and a recent brief period of constipation that has now resolved. He has seen no bright red blood per rectum, denies abdominal pain, dysphagia, odynophagia, nausea, vomiting, early satiety or weight loss. What has been bothering him the most for the last few months is exertional dyspnea. He was feeling well at his last visit with cardiology in late June, echocardiogram a few months prior to that showed stable ejection fraction of 35-40%, and his AICD has apparently been checked and functioning properly. However, over the summer he started having frequent sinus and lower respiratory congestion and he attributed to pollen. He has seen his primary care couple of times for this visit is still symptomatic. He is dyspneic just walking down to the mailbox. He was advised to see his cardiologist, but says he could not find their phone number around the house, much to his wife's frustration. He denies chest pain or pressure with exertion or at rest. He does not have a productive cough, but his wife says that ounce congested and snores at night time.  ROS:  Review of Systems   Past Medical History: Past Medical History:  Diagnosis Date  . AICD (automatic cardioverter/defibrillator) present 10/18/2016  . Anemia   . Arthritis    hip, hands- Osteoarthritis    . CAD (coronary artery disease)   . CKD (chronic kidney disease) stage 3, GFR  30-59 ml/min (HCC)    [name]  . Diverticulosis   . Dyslipidemia   . GERD (gastroesophageal reflux disease)   . Hypertension    g. taylor  . ICD (implantable cardiac defibrillator) in place    2009-St. Jude device  . Neuromuscular disorder (HCC)    neuropathy- in hands  . Pacemaker   . Shortness of breath   . Sleep apnea    started a sleep study but didn't completed, states PCP told him he has sleep  apnea, doesn't use CPAP  . Systolic dysfunction   . Type 2 diabetes with nephropathy ([place])   . Vitamin D deficiency      Past Surgical History: Past Surgical History:  Procedure Laterality Date  . CERVICAL FUSION    . ICD IMPLANT N/A 10/18/2016   Procedure: ICD Implant- Upgrade to Dual Chamber;  Surgeon: Evans Lance, MD;  Location: [facility];  Service: Cardiovascular;  Laterality: N/A;  . INSERT / REPLACE / REMOVE PACEMAKER    . left carpal tunnel release Left   . left rotator cuff surgery Left   . LUMBAR LAMINECTOMY Left 07/17/2013   Procedure: MICRODISCECTOMY LUMBAR LAMINECTOMY;  Surgeon: Marybelle Killings, MD;  Location: [facility];  Service: Orthopedics;  Laterality: Left;  Left L4 Laminotomy, Lateral Recess Decompression, Cyst Excision  . TOTAL HIP ARTHROPLASTY Left 09/17/2012   Procedure: Left TOTAL HIP ARTHROPLASTY ANTERIOR APPROACH;  Surgeon: Marybelle Killings, MD;  Location: [facility];  Service: Orthopedics;  Laterality: Left;     Family History: Family  History  Problem Relation Age of Onset  . Heart attack Father 43  . Diabetes Brother     Social History: Social History   Social History  . Marital status: Single    Spouse name: N/A  . Number of children: 2  . Years of education: N/A   Occupational History  . 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 Smoking status: Former Smoker    Types: Cigarettes    Quit date: 07/30/1984  . Smokeless tobacco: Never Used  . Alcohol use Yes     Comment: occ  . Drug use: No  . Sexual activity: No    Other Topics Concern  . None   Social History Narrative   Does not have a living will.   Desires CPR, does want life support if not futile.          Allergies: No Known Allergies  Outpatient Meds: Current Outpatient Prescriptions  Medication Sig Dispense Refill  . ACCU-CHEK SOFTCLIX LANCETS lancets Use as instructed to check blood sugar once daily and as needed.  Diagnosis:  E11.21  Non- insulin dependent. 100 each 3  . albuterol (PROVENTIL HFA;VENTOLIN HFA) 108 (90 Base) MCG/ACT inhaler Inhale 2 puffs into the lungs every 6 (six) hours as needed for wheezing or shortness of breath. 1 Inhaler 0  . Alcohol Swabs (B-D SINGLE USE SWABS REGULAR) PADS Use as instructed to clean area for glucose monitoring once daily and as needed.  Diagnosis:  E11.21  Non insulin-dependent 100 each 3  . aspirin EC 81 MG tablet Take 81 mg by mouth daily.    . Blood Glucose Calibration (ACCU-CHEK AVIVA) SOLN Use to calibrate blood glucose machine as recommended.  Diagnosis:  E11.21  Non-insulin dependent. 1 each 2  . Blood Glucose Monitoring Suppl (ACCU-CHEK AVIVA) device Use to check blood sugar once daily.  Dx: E11.21 1 each 0  . carvedilol (COREG) 6.25 MG tablet TAKE 1 TABLET TWICE DAILY WITH A MEAL 180 tablet 0  . furosemide (LASIX) 20 MG tablet Take 1 tablet (20 mg total) by mouth daily. 90 tablet 3  . gabapentin (NEURONTIN) 300 MG capsule Take 2 capsules (600 mg total) by mouth 2 (two) times daily. 60 capsule 0  . glipiZIDE (GLUCOTROL) 5 MG tablet TAKE 1 TABLET EVERY DAY BEFORE BREAKFAST 90 tablet 0  . glucose blood (ACCU-CHEK AVIVA) test strip Use to check blood sugar once daily.  DX: E11.21 100 each 3  . lisinopril (PRINIVIL,ZESTRIL) 2.5 MG tablet Take 1 tablet (2.5 mg total) by mouth daily. 90 tablet 0  . lovastatin (MEVACOR) 20 MG tablet Take 1 tablet (20 mg total) by mouth at bedtime. 90 tablet 0  . meloxicam (MOBIC) 15 MG tablet Take 1 tablet (15 mg total) by mouth daily. 90 tablet 1  .  tamsulosin (FLOMAX) 0.4 MG CAPS capsule Take 1 capsule (0.4 mg total) by mouth daily. 90 capsule 0   No current facility-administered medications for this visit.       ___________________________________________________________________ Objective   Exam:  BP (!) 90/50 (BP Location: Left Arm, Patient Position: Sitting, Cuff Size: Normal)   Pulse 60   Ht 5\' 7"  (1.702 m) Comment: height measured without shoes  Wt 195 lb 4 oz (88.6 kg)   BMI 30.58 kg/m    General: this is a(n) Well-appearing man   Eyes: sclera anicteric, no redness  ENT: oral mucosa moist without lesions, no cervical or supraclavicular lymphadenopathy, good dentition  CV: RRR without murmur, S1/S2, , no peripheral edema. No apparent elevated JVP. AICD left upper chest wall   Resp: clear to auscultation bilaterally,, no rales or rhonchi, normal RR and effort noted  GI: soft, no tenderness, with active bowel sounds. No guarding or palpable organomegaly noted.  Skin; warm and dry, no rash or jaundice noted  Neuro: awake, alert and oriented x 3. Normal gross motor function and fluent speech Rectal: Normal sphincter tone, no palpable internal lesions, soft ,brown, heme negative stool  Labs:  CBC Latest Ref Rng & Units 05/07/2017 10/18/2016 08/19/2015  WBC 4.0 - 10.5 K/uL 6.3 6.2 6.2  Hemoglobin 13.0 - 17.0 g/dL 11.4(L) 12.6(L) 13.0  Hematocrit 39.0 - 52.0 % 35.3(L) 38.9(L) 39.5  Platelets 150.0 - 400.0 K/uL 154.0 153 182.0    nml MCV No iron levels  CMP Latest Ref Rng & Units 04/22/2017 10/18/2016 03/09/2016  Glucose 70 - 99 mg/dL 73 136(H) 118(H)  BUN 6 - 23 mg/dL 39(H) 28(H) 35(H)  Creatinine 0.40 - 1.50 mg/dL 1.94(H) 1.70(H) 1.56(H)  Sodium 135 - 145 mEq/L 137 139 138  Potassium 3.5 - 5.1 mEq/L 5.3(H) 5.1 5.3(H)  Chloride 96 - 112 mEq/L 100 104 103  CO2 19 - 32 mEq/L 28 25 29   Calcium 8.4 - 10.5 mg/dL 9.4 9.1 9.3  Total Protein 6.0 - 8.3 g/dL 7.0 - 6.9  Total Bilirubin 0.2 - 1.2 mg/dL 0.5 - 0.4   Alkaline Phos 39 - 117 U/L 44 - 44  AST 0 - 37 U/L 14 - 19  ALT 0 - 53 U/L 11 - 14    Radiologic Studies:  Echo March 2018: EF 35-40%  Colonoscopy July 2011 with Sharlett Iles - only diverticulosis Neg CXR 03/19/17 Assessment: Encounter Diagnoses  Name Primary?  Marland Kitchen Anemia due to chronic kidney disease, unspecified CKD stage Yes  . Change in bowel function   Congestive heart failure   This patient's normocytic anemia does not appear to be on the basis of chronic GI blood loss. I suspect it may be from chronic kidney disease. I also do not think it is causing his exertional dyspnea. We gave him the office number for Dr. Lovena Le so he can make an appointment soon. However, I have also directed him back to primary care to consider primary pulmonary diagnoses such as allergies, asthma or sleep apnea. He does not need endoscopic procedures at this point. He would be due for his next routine colonoscopy in 3 years. Plan:  As above and see me as needed.  Total face-to-face time 45 minutes including patient evaluation, chart review and discussion of plan.  Thank you for the courtesy of this consult.  Please call me with any questions or concerns.  Nelida Meuse III  CC: Lucille Passy, MD

## 2017-05-23 NOTE — Patient Instructions (Addendum)
If you are age 71 or older, your body mass index should be between 23-30. Your Body mass index is 30.58 kg/m. If this is out of the aforementioned range listed, please consider follow up with your Primary Care Provider.  If you are age 40 or younger, your body mass index should be between 19-25. Your Body mass index is 30.58 kg/m. If this is out of the aformentioned range listed, please consider follow up with your Primary Care Provider.    Dr Crissie Sickles  Address: [address], [place], [place] [address]     Phone: [phone]  Follow up as needed.  Thank you for choosing [facility]  Dr Wilfrid Lund III

## 2017-05-27 ENCOUNTER — Telehealth: Payer: Self-pay | Admitting: Internal Medicine

## 2017-05-27 NOTE — Telephone Encounter (Signed)
Call returned to Pt.  Per Pt was sick most of the summer, not sure he is completely well from then.  Has noticed increasing sob with exertion.  This is new from the last time he saw Dr. Lovena Le in 12/2016.  Notified Pt that we would try to get him in to see Dr. Lovena Le in mid to end of November, if that was ok.  Pt agreeable.  Will send to scheduler.

## 2017-05-27 NOTE — Telephone Encounter (Signed)
New Message     Pt c/o Shortness Of Breath: STAT if SOB developed within the last 24 hours or pt is noticeably SOB on the phone  1. Are you currently SOB (can you hear that pt is SOB on the phone)? Slight wheezing  2. How long have you been experiencing SOB? 2 months   3. Are you SOB when sitting or when up moving around? exertion  4. Are you currently experiencing any other symptoms? No   GI doctor told him he needs to either see cardiologist or a pulmonary doctor , no openings on [facility] schedule or APP schedule for anytime soon

## 2017-05-28 NOTE — Telephone Encounter (Signed)
Appt made with Dr. Lovena Le.

## 2017-06-03 ENCOUNTER — Telehealth: Payer: Self-pay | Admitting: *Deleted

## 2017-06-03 MED ORDER — CARVEDILOL 12.5 MG PO TABS: ORAL_TABLET | ORAL | 3 refills | Status: DC

## 2017-06-03 NOTE — Telephone Encounter (Signed)
Mr. Rehfeld calling regarding episodes of dizziness, syncope and shock 06/02/17. Episodes reviewed in [place], several episodes of VT terminated with ATP, one shock delivered. Pt feels fine today. Reports no missed doses of prescribed medications. I advised pt of [place] driving restrictions x 6 months from time of episode. He verbalizes understanding.  I will review episodes with Dr. Rayann Heman since Dr. Lovena Le is out of office and call pt back with recommendations.

## 2017-06-03 NOTE — Telephone Encounter (Signed)
Reviewed with JA- recommended increasing coreg dose 12.5mg  twice daily. I will send to [facility] mail order pharmacy. Follow up with GT as scheduled 06/19/17. I advised Randy [name] to check his BP and to call if he experiences dizziness or low BP readings. He verbalizes understanding.

## 2017-06-03 NOTE — Telephone Encounter (Signed)
Mr. Moes calling regarding episodes of dizziness, syncope and shock 06/02/17. Episodes reviewed in [place], several episodes of VT terminated with ATP, one shock delivered. Pt feels fine today. Reports no missed doses of prescribed medications. I advised pt of NCD[place] driving restrictions x 6 months from time of episode. He verbalizes understanding.  I will review episodes with Dr. Rayann Heman since Dr. Lovena Le is out of office and call pt back with recommendations.

## 2017-06-19 ENCOUNTER — Ambulatory Visit: Payer: Medicare HMO | Admitting: Internal Medicine

## 2017-06-19 ENCOUNTER — Encounter: Payer: Self-pay | Admitting: Internal Medicine

## 2017-06-19 VITALS — BP 124/62 | HR 64 | Ht 67.0 in | Wt 194.0 lb

## 2017-06-19 DIAGNOSIS — Z95810 Presence of automatic (implantable) cardiac defibrillator: Secondary | ICD-10-CM | POA: Diagnosis not present

## 2017-06-19 DIAGNOSIS — I5022 Chronic systolic (congestive) heart failure: Secondary | ICD-10-CM | POA: Diagnosis not present

## 2017-06-19 DIAGNOSIS — R0602 Shortness of breath: Secondary | ICD-10-CM | POA: Diagnosis not present

## 2017-06-19 LAB — CUP PACEART INCLINIC DEVICE CHECK
Battery Remaining Longevity: 97 mo
Brady Statistic RA Percent Paced: 0.65 %
Brady Statistic RV Percent Paced: 0.12 %
HighPow Impedance: 66.375
Implantable Lead Implant Date: 20091022
Implantable Lead Implant Date: 20180322
Implantable Lead Location: 753860
Implantable Lead Model: 7121
Lead Channel Impedance Value: 587.5 Ohm
Lead Channel Pacing Threshold Amplitude: 1 V
Lead Channel Pacing Threshold Amplitude: 1 V
Lead Channel Pacing Threshold Amplitude: 1.5 V
Lead Channel Pacing Threshold Pulse Width: 0.5 ms
Lead Channel Pacing Threshold Pulse Width: 0.6 ms
Lead Channel Sensing Intrinsic Amplitude: 1.7 mV
Lead Channel Setting Pacing Pulse Width: 0.6 ms
Lead Channel Setting Sensing Sensitivity: 0.5 mV
MDC IDC LEAD LOCATION: 753859
MDC IDC MSMT LEADCHNL RA IMPEDANCE VALUE: 400 Ohm
MDC IDC MSMT LEADCHNL RA PACING THRESHOLD PULSEWIDTH: 0.5 ms
MDC IDC MSMT LEADCHNL RV PACING THRESHOLD AMPLITUDE: 1.5 V
MDC IDC MSMT LEADCHNL RV PACING THRESHOLD PULSEWIDTH: 0.6 ms
MDC IDC MSMT LEADCHNL RV SENSING INTR AMPL: 12 mV
MDC IDC PG IMPLANT DT: 20180322
MDC IDC PG SERIAL: 7413245
MDC IDC SESS DTM: 20181121163926
MDC IDC SET LEADCHNL RA PACING AMPLITUDE: 1.875
MDC IDC SET LEADCHNL RV PACING AMPLITUDE: 1.5 V

## 2017-06-19 NOTE — Patient Instructions (Addendum)
Medication Instructions:  Your physician has recommended you make the following change in your medication:  1.  Take an extra furosemide 20 mg one tablet by mouth daily for 3 days.  Then return to previous schedule.  Labwork: None ordered.  Testing/Procedures: None ordered.  Follow-Up: Your physician wants you to follow-up in: one year with Dr. Lovena Le.   You will receive a reminder letter in the mail two months in advance. If you don't receive a letter, please call our office to schedule the follow-up appointment.  Remote monitoring is used to monitor your ICD from home. This monitoring reduces the number of office visits required to check your device to one time per year. It allows [place] to keep an eye on the functioning of your device to ensure it is working properly. You are scheduled for a device check from home on 09/18/2017. You may send your transmission at any time that day. If you have a wireless device, the transmission will be sent automatically. After your physician reviews your transmission, you will receive a postcard with your next transmission date.   Any Other Special Instructions Will Be Listed Below (If Applicable).  You may not drive again until Nov 30, 2017.  If you need a refill on your cardiac medications before your next appointment, please call your pharmacy.

## 2017-06-19 NOTE — Progress Notes (Addendum)
HPI Mr. Boffa returns today for ongoing evaluation and management of ventricular tachycardia, chronic systolic heart failure, status post ICD insertion. The patient has done well. He was playing golf several weeks ago when he had a near syncopal episode. A few minutes later, he received an ICD shock after another near syncopal episode. He denies anginal symptoms. No heart failure symptoms. No Known Allergies   Current Outpatient Medications  Medication Sig Dispense Refill  . ACCU-CHEK SOFTCLIX LANCETS lancets Use as instructed to check blood sugar once daily and as needed.  Diagnosis:  E11.21  Non- insulin dependent. 100 each 3  . albuterol (PROVENTIL HFA;VENTOLIN HFA) 108 (90 Base) MCG/ACT inhaler Inhale 2 puffs into the lungs every 6 (six) hours as needed for wheezing or shortness of breath. 1 Inhaler 0  . Alcohol Swabs (B-D SINGLE USE SWABS REGULAR) PADS Use as instructed to clean area for glucose monitoring once daily and as needed.  Diagnosis:  E11.21  Non insulin-dependent 100 each 3  . aspirin EC 81 MG tablet Take 81 mg by mouth daily.    . Blood Glucose Calibration (ACCU-CHEK AVIVA) SOLN Use to calibrate blood glucose machine as recommended.  Diagnosis:  E11.21  Non-insulin dependent. 1 each 2  . Blood Glucose Monitoring Suppl (ACCU-CHEK AVIVA) device Use to check blood sugar once daily.  Dx: E11.21 1 each 0  . carvedilol (COREG) 12.5 MG tablet TAKE 1 TABLET TWICE DAILY WITH A MEAL 180 tablet 3  . furosemide (LASIX) 20 MG tablet Take 1 tablet (20 mg total) by mouth daily. 90 tablet 3  . gabapentin (NEURONTIN) 300 MG capsule Take 2 capsules (600 mg total) by mouth 2 (two) times daily. 60 capsule 0  . glipiZIDE (GLUCOTROL) 5 MG tablet TAKE 1 TABLET EVERY DAY BEFORE BREAKFAST 90 tablet 0  . glucose blood (ACCU-CHEK AVIVA) test strip Use to check blood sugar once daily.  DX: E11.21 100 each 3  . lisinopril (PRINIVIL,ZESTRIL) 2.5 MG tablet Take 1 tablet (2.5 mg total) by mouth daily.  90 tablet 0  . lovastatin (MEVACOR) 20 MG tablet Take 1 tablet (20 mg total) by mouth at bedtime. 90 tablet 0  . meloxicam (MOBIC) 15 MG tablet Take 1 tablet (15 mg total) by mouth daily. 90 tablet 1  . tamsulosin (FLOMAX) 0.4 MG CAPS capsule Take 1 capsule (0.4 mg total) by mouth daily. 90 capsule 0   No current facility-administered medications for this visit.      Past Medical History:  Diagnosis Date  . AICD (automatic cardioverter/defibrillator) present 10/18/2016  . Anemia   . Arthritis    hip, hands- Osteoarthritis    . CAD (coronary artery disease)   . CKD (chronic kidney disease) stage 3, GFR 30-59 ml/min (HCC)    [name]  . Diverticulosis   . Dyslipidemia   . GERD (gastroesophageal reflux disease)   . Hypertension    g. Lynnie Koehler  . ICD (implantable cardiac defibrillator) in place    2009-St. Jude device  . Neuromuscular disorder (HCC)    neuropathy- in hands  . Pacemaker   . Shortness of breath   . Sleep apnea    started a sleep study but didn't completed, states PCP told him he has sleep  apnea, doesn't use CPAP  . Systolic dysfunction   . Type 2 diabetes with nephropathy ([place])   . Vitamin D deficiency     ROS:   All systems reviewed and negative except as noted in the HPI.  Past Surgical History:  Procedure Laterality Date  . CERVICAL FUSION    . ICD IMPLANT N/A 10/18/2016   Procedure: ICD Implant- Upgrade to Dual Chamber;  Surgeon: Evans Lance, MD;  Location: [facility];  Service: Cardiovascular;  Laterality: N/A;  . INSERT / REPLACE / REMOVE PACEMAKER    . left carpal tunnel release Left   . left rotator cuff surgery Left   . LUMBAR LAMINECTOMY Left 07/17/2013   Procedure: MICRODISCECTOMY LUMBAR LAMINECTOMY;  Surgeon: Marybelle Killings, MD;  Location: [facility];  Service: Orthopedics;  Laterality: Left;  Left L4 Laminotomy, Lateral Recess Decompression, Cyst Excision  . TOTAL HIP ARTHROPLASTY Left 09/17/2012   Procedure: Left TOTAL HIP ARTHROPLASTY  ANTERIOR APPROACH;  Surgeon: Marybelle Killings, MD;  Location: [facility];  Service: Orthopedics;  Laterality: Left;     Family History  Problem Relation Age of Onset  . Heart attack Father 61  . Diabetes Brother      Social History   Socioeconomic History  . Marital status: Single    Spouse name: Not on file  . Number of children: 2  . Years of education: Not on file  . Highest education level: Not on file  Social Needs  . Financial resource strain: Not on file  . Food insecurity - worry: Not on file  . Food insecurity - inability: Not on file  . Transportation needs - medical: Not on file  . Transportation needs - non-medical: Not on file  Occupational History  . 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 Smoking status: Former Smoker    Types: Cigarettes    Last attempt to quit: 07/30/1984    Years since quitting: 32.9  . Smokeless tobacco: Never Used  Substance and Sexual Activity  . Alcohol use: Yes    Comment: occ  . Drug use: No  . Sexual activity: No  Other Topics Concern  . Not on file  Social History Narrative   Does not have a living will.   Desires CPR, does want life support if not futile.        BP 124/62   Pulse 64   Ht 5\' 7"  (1.702 m)   Wt 194 lb (88 kg)   SpO2 94%   BMI 30.38 kg/m   Physical Exam:  Well appearing 71 year old man, NAD HEENT: Unremarkable Neck:  6 cm JVD, no thyromegally Lymphatics:  No adenopathy Back:  No CVA tenderness Lungs:  Clear, with no wheezes, rales, or rhonchi HEART:  Regular rate rhythm, no murmurs, no rubs, no clicks Abd:  soft, positive bowel sounds, no organomegally, no rebound, no guarding Ext:  2 plus pulses, no edema, no cyanosis, no clubbing Skin:  No rashes no nodules Neuro:  CN II through XII intact, motor grossly intact  EKG - normal sinus rhythm with prior anteroseptal MI  DEVICE  Normal device function.  See PaceArt for details.   Assess/Plan: 1.  Ventricular tachycardia - the patient had 4 or 5 episodes of sustained monomorphic ventricular tachycardia. He is on no antiarrhythmic drug therapy. His defibrillator worked very nicely to control his symptoms. Because he has had no recurrent episodes in 2 weeks, I recommended against initiating amiodarone therapy but would do so if he had recurrent symptoms. 2. ICD - interrogation of his ICD demonstrates that he has had recurrent VT at over 220 bpm. All but one of these episodes were terminated with ATP.  He has been instructed not to drive for 6 months. 3. Chronic systolic heart failure - his symptoms remain class II. He will continue his current medications. He will maintain a low-sodium diet. 4. Coronary artery disease - the patient is status post non-ST elevation MI. He has no anginal symptoms presently. He will continue his current medications except we will have him increase his diuretic therapy for 3 days.  Cristopher Peru, M.D.

## 2017-06-25 ENCOUNTER — Other Ambulatory Visit: Payer: Self-pay | Admitting: Family Medicine

## 2017-07-29 ENCOUNTER — Other Ambulatory Visit: Payer: Self-pay | Admitting: Family Medicine

## 2017-08-26 ENCOUNTER — Telehealth: Payer: Self-pay | Admitting: *Deleted

## 2017-08-26 NOTE — Telephone Encounter (Signed)
Called patient regarding (3) VT-2 episodes from 08/25/17-(2) 30J ICD shocks received. Patient states that he was out playing golf yesterday, and had sat down to rest in the golf cart when his ICD shocked him twice. Patient denies any CP, dizziness, or syncope before or after the shocks. Patient states that he has been taking his Carvedilol 12.5mg  BID as Rx'd. I reminded patient of the [place] driving restrictions x 6 months from the day of his last therapy. Patient verbalized understanding.   Will forward information to Dr.Taylor and notify patient if anything further is recommended.

## 2017-08-27 ENCOUNTER — Other Ambulatory Visit: Payer: Self-pay

## 2017-08-27 ENCOUNTER — Inpatient Hospital Stay (HOSPITAL_COMMUNITY)
Admission: EM | Admit: 2017-08-27 | Discharge: 2017-09-10 | DRG: 233 | Disposition: A | Discharge status: Home / Self Care | Payer: Medicare HMO | Attending: Surgery | Admitting: Surgery

## 2017-08-27 ENCOUNTER — Encounter (HOSPITAL_COMMUNITY): Payer: Self-pay | Admitting: Emergency Medicine

## 2017-08-27 ENCOUNTER — Emergency Department (HOSPITAL_COMMUNITY): Payer: Medicare HMO

## 2017-08-27 ENCOUNTER — Inpatient Hospital Stay (HOSPITAL_COMMUNITY): Payer: Medicare HMO

## 2017-08-27 DIAGNOSIS — K219 Gastro-esophageal reflux disease without esophagitis: Secondary | ICD-10-CM | POA: Diagnosis present

## 2017-08-27 DIAGNOSIS — I5043 Acute on chronic combined systolic (congestive) and diastolic (congestive) heart failure: Secondary | ICD-10-CM | POA: Diagnosis not present

## 2017-08-27 DIAGNOSIS — E1121 Type 2 diabetes mellitus with diabetic nephropathy: Secondary | ICD-10-CM | POA: Diagnosis not present

## 2017-08-27 DIAGNOSIS — I2510 Atherosclerotic heart disease of native coronary artery without angina pectoris: Secondary | ICD-10-CM | POA: Diagnosis not present

## 2017-08-27 DIAGNOSIS — I5023 Acute on chronic systolic (congestive) heart failure: Secondary | ICD-10-CM

## 2017-08-27 DIAGNOSIS — N183 Chronic kidney disease, stage 3 (moderate): Secondary | ICD-10-CM | POA: Diagnosis not present

## 2017-08-27 DIAGNOSIS — G4730 Sleep apnea, unspecified: Secondary | ICD-10-CM | POA: Diagnosis present

## 2017-08-27 DIAGNOSIS — J449 Chronic obstructive pulmonary disease, unspecified: Secondary | ICD-10-CM | POA: Diagnosis present

## 2017-08-27 DIAGNOSIS — D72829 Elevated white blood cell count, unspecified: Secondary | ICD-10-CM

## 2017-08-27 DIAGNOSIS — J90 Pleural effusion, not elsewhere classified: Secondary | ICD-10-CM | POA: Diagnosis not present

## 2017-08-27 DIAGNOSIS — E1122 Type 2 diabetes mellitus with diabetic chronic kidney disease: Secondary | ICD-10-CM | POA: Diagnosis present

## 2017-08-27 DIAGNOSIS — Z452 Encounter for adjustment and management of vascular access device: Secondary | ICD-10-CM | POA: Diagnosis not present

## 2017-08-27 DIAGNOSIS — R0602 Shortness of breath: Secondary | ICD-10-CM | POA: Diagnosis not present

## 2017-08-27 DIAGNOSIS — Z789 Other specified health status: Secondary | ICD-10-CM

## 2017-08-27 DIAGNOSIS — R748 Abnormal levels of other serum enzymes: Secondary | ICD-10-CM

## 2017-08-27 DIAGNOSIS — I252 Old myocardial infarction: Secondary | ICD-10-CM

## 2017-08-27 DIAGNOSIS — I4891 Unspecified atrial fibrillation: Secondary | ICD-10-CM | POA: Diagnosis present

## 2017-08-27 DIAGNOSIS — Z95810 Presence of automatic (implantable) cardiac defibrillator: Secondary | ICD-10-CM | POA: Diagnosis not present

## 2017-08-27 DIAGNOSIS — E119 Type 2 diabetes mellitus without complications: Secondary | ICD-10-CM | POA: Diagnosis not present

## 2017-08-27 DIAGNOSIS — N401 Enlarged prostate with lower urinary tract symptoms: Secondary | ICD-10-CM | POA: Diagnosis present

## 2017-08-27 DIAGNOSIS — D6959 Other secondary thrombocytopenia: Secondary | ICD-10-CM | POA: Diagnosis present

## 2017-08-27 DIAGNOSIS — I130 Hypertensive heart and chronic kidney disease with heart failure and stage 1 through stage 4 chronic kidney disease, or unspecified chronic kidney disease: Secondary | ICD-10-CM | POA: Diagnosis not present

## 2017-08-27 DIAGNOSIS — J9601 Acute respiratory failure with hypoxia: Secondary | ICD-10-CM

## 2017-08-27 DIAGNOSIS — J939 Pneumothorax, unspecified: Secondary | ICD-10-CM

## 2017-08-27 DIAGNOSIS — R338 Other retention of urine: Secondary | ICD-10-CM | POA: Diagnosis not present

## 2017-08-27 DIAGNOSIS — R7989 Other specified abnormal findings of blood chemistry: Secondary | ICD-10-CM | POA: Diagnosis not present

## 2017-08-27 DIAGNOSIS — D696 Thrombocytopenia, unspecified: Secondary | ICD-10-CM | POA: Diagnosis not present

## 2017-08-27 DIAGNOSIS — I255 Ischemic cardiomyopathy: Secondary | ICD-10-CM | POA: Diagnosis not present

## 2017-08-27 DIAGNOSIS — I6523 Occlusion and stenosis of bilateral carotid arteries: Secondary | ICD-10-CM | POA: Diagnosis present

## 2017-08-27 DIAGNOSIS — Z01810 Encounter for preprocedural cardiovascular examination: Secondary | ICD-10-CM | POA: Diagnosis not present

## 2017-08-27 DIAGNOSIS — I509 Heart failure, unspecified: Secondary | ICD-10-CM

## 2017-08-27 DIAGNOSIS — R0902 Hypoxemia: Secondary | ICD-10-CM

## 2017-08-27 DIAGNOSIS — I5022 Chronic systolic (congestive) heart failure: Secondary | ICD-10-CM | POA: Diagnosis not present

## 2017-08-27 DIAGNOSIS — I110 Hypertensive heart disease with heart failure: Secondary | ICD-10-CM | POA: Diagnosis not present

## 2017-08-27 DIAGNOSIS — R0603 Acute respiratory distress: Secondary | ICD-10-CM | POA: Diagnosis not present

## 2017-08-27 DIAGNOSIS — T502X5A Adverse effect of carbonic-anhydrase inhibitors, benzothiadiazides and other diuretics, initial encounter: Secondary | ICD-10-CM | POA: Diagnosis present

## 2017-08-27 DIAGNOSIS — I10 Essential (primary) hypertension: Secondary | ICD-10-CM

## 2017-08-27 DIAGNOSIS — R778 Other specified abnormalities of plasma proteins: Secondary | ICD-10-CM

## 2017-08-27 DIAGNOSIS — Z950 Presence of cardiac pacemaker: Secondary | ICD-10-CM | POA: Diagnosis not present

## 2017-08-27 DIAGNOSIS — Z4682 Encounter for fitting and adjustment of non-vascular catheter: Secondary | ICD-10-CM | POA: Diagnosis not present

## 2017-08-27 DIAGNOSIS — I214 Non-ST elevation (NSTEMI) myocardial infarction: Secondary | ICD-10-CM | POA: Diagnosis not present

## 2017-08-27 DIAGNOSIS — I083 Combined rheumatic disorders of mitral, aortic and tricuspid valves: Secondary | ICD-10-CM | POA: Diagnosis not present

## 2017-08-27 DIAGNOSIS — Z87891 Personal history of nicotine dependence: Secondary | ICD-10-CM | POA: Diagnosis not present

## 2017-08-27 DIAGNOSIS — Z951 Presence of aortocoronary bypass graft: Secondary | ICD-10-CM

## 2017-08-27 DIAGNOSIS — N189 Chronic kidney disease, unspecified: Secondary | ICD-10-CM | POA: Diagnosis not present

## 2017-08-27 DIAGNOSIS — N179 Acute kidney failure, unspecified: Secondary | ICD-10-CM | POA: Diagnosis not present

## 2017-08-27 HISTORY — DX: Heart failure, unspecified: I50.9

## 2017-08-27 HISTORY — DX: Unspecified osteoarthritis, unspecified site: M19.90

## 2017-08-27 LAB — I-STAT ARTERIAL BLOOD GAS, ED
Acid-base deficit: 8 mmol/L — ABNORMAL HIGH (ref 0.0–2.0)
BICARBONATE: 19.3 mmol/L — AB (ref 20.0–28.0)
O2 Saturation: 95 %
PCO2 ART: 46 mmHg (ref 32.0–48.0)
TCO2: 21 mmol/L — ABNORMAL LOW (ref 22–32)
pH, Arterial: 7.234 — ABNORMAL LOW (ref 7.350–7.450)
pO2, Arterial: 90 mmHg (ref 83.0–108.0)

## 2017-08-27 LAB — HEPARIN LEVEL (UNFRACTIONATED)
HEPARIN UNFRACTIONATED: 0.34 [IU]/mL (ref 0.30–0.70)
Heparin Unfractionated: 0.19 IU/mL — ABNORMAL LOW (ref 0.30–0.70)
Heparin Unfractionated: <0.1 IU/mL — ABNORMAL LOW (ref 0.30–0.70)

## 2017-08-27 LAB — BASIC METABOLIC PANEL
Anion gap: 11 (ref 5–15)
BUN: 30 mg/dL — AB (ref 6–20)
CHLORIDE: 104 mmol/L (ref 101–111)
CO2: 22 mmol/L (ref 22–32)
Calcium: 9.1 mg/dL (ref 8.9–10.3)
Creatinine, Ser: 1.7 mg/dL — ABNORMAL HIGH (ref 0.61–1.24)
GFR calc Af Amer: 45 mL/min — ABNORMAL LOW (ref >60)
GFR calc non Af Amer: 39 mL/min — ABNORMAL LOW (ref >60)
GLUCOSE: 172 mg/dL — AB (ref 65–99)
POTASSIUM: 5.1 mmol/L (ref 3.5–5.1)
SODIUM: 137 mmol/L (ref 135–145)

## 2017-08-27 LAB — ECHOCARDIOGRAM COMPLETE
HEIGHTINCHES: 67 in
WEIGHTICAEL: 3104 [oz_av]

## 2017-08-27 LAB — GLUCOSE, CAPILLARY
GLUCOSE-CAPILLARY: 119 mg/dL — AB (ref 65–99)
Glucose-Capillary: 139 mg/dL — ABNORMAL HIGH (ref 65–99)

## 2017-08-27 LAB — CBC
HEMATOCRIT: 39.4 % (ref 39.0–52.0)
Hemoglobin: 12.6 g/dL — ABNORMAL LOW (ref 13.0–17.0)
MCH: 28.7 pg (ref 26.0–34.0)
MCHC: 32 g/dL (ref 30.0–36.0)
MCV: 89.7 fL (ref 78.0–100.0)
Platelets: 183 10*3/uL (ref 150–400)
RBC: 4.39 MIL/uL (ref 4.22–5.81)
RDW: 13.7 % (ref 11.5–15.5)
WBC: 10.9 10*3/uL — AB (ref 4.0–10.5)

## 2017-08-27 LAB — BRAIN NATRIURETIC PEPTIDE: B Natriuretic Peptide: 883.5 pg/mL — ABNORMAL HIGH (ref 0.0–100.0)

## 2017-08-27 LAB — CBG MONITORING, ED
GLUCOSE-CAPILLARY: 145 mg/dL — AB (ref 65–99)
Glucose-Capillary: 113 mg/dL — ABNORMAL HIGH (ref 65–99)

## 2017-08-27 LAB — TROPONIN I
TROPONIN I: 7.84 ng/mL — AB (ref <0.03)
Troponin I: 10.29 ng/mL (ref <0.03)
Troponin I: 11.38 ng/mL (ref <0.03)
Troponin I: 11.77 ng/mL (ref <0.03)

## 2017-08-27 LAB — TSH: TSH: 2.113 u[IU]/mL (ref 0.350–4.500)

## 2017-08-27 LAB — I-STAT TROPONIN, ED: Troponin i, poc: 5.58 ng/mL (ref 0.00–0.08)

## 2017-08-27 LAB — MAGNESIUM: MAGNESIUM: 2 mg/dL (ref 1.7–2.4)

## 2017-08-27 MED ORDER — ALBUTEROL SULFATE (2.5 MG/3ML) 0.083% IN NEBU
5.0000 mg | INHALATION_SOLUTION | Freq: Once | RESPIRATORY_TRACT | Status: DC
  Administered 2017-08-27: 5 mg via RESPIRATORY_TRACT
  Filled 2017-08-27: qty 6

## 2017-08-27 MED ORDER — ACETAMINOPHEN 325 MG PO TABS: 650.0000 mg | ORAL_TABLET | ORAL | Status: DC | PRN

## 2017-08-27 MED ORDER — AMIODARONE HCL IN DEXTROSE 360-4.14 MG/200ML-% IV SOLN
60.0000 mg/h | Freq: Once | INTRAVENOUS | Status: AC
  Administered 2017-08-27: 60 mg/h via INTRAVENOUS
  Filled 2017-08-27: qty 200

## 2017-08-27 MED ORDER — ASPIRIN EC 81 MG PO TBEC
81.0000 mg | DELAYED_RELEASE_TABLET | Freq: Every day | ORAL | Status: DC
  Administered 2017-08-27 – 2017-09-01 (×5): 81 mg via ORAL
  Filled 2017-08-27 (×6): qty 1

## 2017-08-27 MED ORDER — IPRATROPIUM-ALBUTEROL 0.5-2.5 (3) MG/3ML IN SOLN
3.0000 mL | Freq: Four times a day (QID) | RESPIRATORY_TRACT | Status: DC
  Administered 2017-08-27 – 2017-08-28 (×6): 3 mL via RESPIRATORY_TRACT
  Filled 2017-08-27 (×6): qty 3

## 2017-08-27 MED ORDER — ASPIRIN 300 MG RE SUPP
300.0000 mg | Freq: Once | RECTAL | Status: AC
  Administered 2017-08-27: 300 mg via RECTAL
  Filled 2017-08-27: qty 1

## 2017-08-27 MED ORDER — IPRATROPIUM-ALBUTEROL 0.5-2.5 (3) MG/3ML IN SOLN: 3.0000 mL | RESPIRATORY_TRACT | Status: DC | PRN

## 2017-08-27 MED ORDER — PERFLUTREN LIPID MICROSPHERE
1.0000 mL | INTRAVENOUS | Status: AC | PRN
  Administered 2017-08-27: 6 mL via INTRAVENOUS
  Filled 2017-08-27: qty 10

## 2017-08-27 MED ORDER — AMIODARONE LOAD VIA INFUSION
150.0000 mg | Freq: Once | INTRAVENOUS | Status: AC
  Administered 2017-08-27: 150 mg via INTRAVENOUS
  Filled 2017-08-27: qty 83.34

## 2017-08-27 MED ORDER — FUROSEMIDE 10 MG/ML IJ SOLN
40.0000 mg | Freq: Two times a day (BID) | INTRAMUSCULAR | Status: DC
  Administered 2017-08-27: 40 mg via INTRAVENOUS
  Filled 2017-08-27: qty 4

## 2017-08-27 MED ORDER — FUROSEMIDE 10 MG/ML IJ SOLN
40.0000 mg | Freq: Two times a day (BID) | INTRAMUSCULAR | Status: AC
  Administered 2017-08-27: 40 mg via INTRAVENOUS
  Filled 2017-08-27: qty 4

## 2017-08-27 MED ORDER — SODIUM CHLORIDE 0.9 % IV SOLN: 250.0000 mL | INTRAVENOUS | Status: DC | PRN

## 2017-08-27 MED ORDER — GABAPENTIN 300 MG PO CAPS
600.0000 mg | ORAL_CAPSULE | Freq: Two times a day (BID) | ORAL | Status: DC
  Administered 2017-08-27 – 2017-09-10 (×28): 600 mg via ORAL
  Filled 2017-08-27 (×28): qty 2

## 2017-08-27 MED ORDER — ATORVASTATIN CALCIUM 80 MG PO TABS
80.0000 mg | ORAL_TABLET | Freq: Every day | ORAL | Status: DC
  Administered 2017-08-27 – 2017-09-09 (×13): 80 mg via ORAL
  Filled 2017-08-27 (×14): qty 1

## 2017-08-27 MED ORDER — AMIODARONE HCL IN DEXTROSE 360-4.14 MG/200ML-% IV SOLN
60.0000 mg/h | INTRAVENOUS | Status: AC
  Administered 2017-08-27 (×2): 60 mg/h via INTRAVENOUS
  Filled 2017-08-27: qty 200

## 2017-08-27 MED ORDER — FUROSEMIDE 10 MG/ML IJ SOLN
40.0000 mg | Freq: Once | INTRAMUSCULAR | Status: AC
Start: 2017-08-27 — End: 2017-08-27
  Administered 2017-08-27: 40 mg via INTRAVENOUS
  Filled 2017-08-27: qty 4

## 2017-08-27 MED ORDER — AMIODARONE HCL IN DEXTROSE 360-4.14 MG/200ML-% IV SOLN: 30.0000 mg/h | INTRAVENOUS | Status: DC

## 2017-08-27 MED ORDER — HEPARIN (PORCINE) IN NACL 100-0.45 UNIT/ML-% IJ SOLN
1350.0000 [IU]/h | INTRAMUSCULAR | Status: DC
  Administered 2017-08-27: 1100 [IU]/h via INTRAVENOUS
  Administered 2017-08-27: 1200 [IU]/h via INTRAVENOUS
  Administered 2017-08-28: 1350 [IU]/h via INTRAVENOUS
  Filled 2017-08-27 (×4): qty 250

## 2017-08-27 MED ORDER — ONDANSETRON HCL 4 MG/2ML IJ SOLN: 4.0000 mg | Freq: Four times a day (QID) | INTRAMUSCULAR | Status: DC | PRN

## 2017-08-27 MED ORDER — SODIUM CHLORIDE 0.9% FLUSH: 3.0000 mL | INTRAVENOUS | Status: DC | PRN

## 2017-08-27 MED ORDER — IPRATROPIUM-ALBUTEROL 0.5-2.5 (3) MG/3ML IN SOLN: 3.0000 mL | Freq: Four times a day (QID) | RESPIRATORY_TRACT | Status: DC

## 2017-08-27 MED ORDER — SODIUM CHLORIDE 0.9% FLUSH
3.0000 mL | Freq: Two times a day (BID) | INTRAVENOUS | Status: DC
  Administered 2017-08-27 – 2017-09-01 (×6): 3 mL via INTRAVENOUS

## 2017-08-27 MED ORDER — AMIODARONE HCL IN DEXTROSE 360-4.14 MG/200ML-% IV SOLN
30.0000 mg/h | INTRAVENOUS | Status: DC
  Administered 2017-08-27 – 2017-08-28 (×2): 30 mg/h via INTRAVENOUS
  Filled 2017-08-27 (×2): qty 200

## 2017-08-27 MED ORDER — HEPARIN BOLUS VIA INFUSION
4000.0000 [IU] | Freq: Once | INTRAVENOUS | Status: AC
  Administered 2017-08-27: 4000 [IU] via INTRAVENOUS
  Filled 2017-08-27: qty 4000

## 2017-08-27 MED ORDER — INSULIN ASPART 100 UNIT/ML ~~LOC~~ SOLN
0.0000 [IU] | Freq: Three times a day (TID) | SUBCUTANEOUS | Status: DC
  Administered 2017-08-27 – 2017-08-28 (×4): 1 [IU] via SUBCUTANEOUS
  Filled 2017-08-27: qty 1

## 2017-08-27 MED ORDER — AMIODARONE HCL IN DEXTROSE 360-4.14 MG/200ML-% IV SOLN
60.0000 mg/h | INTRAVENOUS | Status: DC
  Administered 2017-08-27 (×2): 60 mg/h via INTRAVENOUS
  Filled 2017-08-27: qty 200

## 2017-08-27 MED ORDER — CARVEDILOL 12.5 MG PO TABS
12.5000 mg | ORAL_TABLET | Freq: Two times a day (BID) | ORAL | Status: DC
  Administered 2017-08-27 – 2017-09-01 (×10): 12.5 mg via ORAL
  Filled 2017-08-27 (×12): qty 1

## 2017-08-27 MED ORDER — AMIODARONE LOAD VIA INFUSION
150.0000 mg | Freq: Once | INTRAVENOUS | Status: DC | PRN
  Filled 2017-08-27: qty 83.34

## 2017-08-27 MED ORDER — MAGNESIUM SULFATE 2 GM/50ML IV SOLN
2.0000 g | Freq: Once | INTRAVENOUS | Status: AC
  Administered 2017-08-27: 2 g via INTRAVENOUS
  Filled 2017-08-27: qty 50

## 2017-08-27 NOTE — Progress Notes (Signed)
Patient seen and examined  72 y.o. male with a hx of 2v CAD w/ med rx, chronic systolic CHF with AICD placement in 09/2016, HTN, HLD, T2DM, and CKD stage III and  who is being seen today for the evaluation of shortness of breath , requiring BiPAP. BNP 883.5chest x-ray showed diffuse interstitial pattern to the lung likely pulmonary edema. Started on diuresis with IV Lasix. Seen by cardiology probably has severe two-vessel coronary artery disease/non-ST elevation MI in the setting of elevated troponin.ICD showed intermittent ventricular tachycardia. Patient has chronic kidney disease with elevated renal function  Cardiology to proceed with cardiac catheterization  After optimization of renal function,at some point but not today,will keep NPO after midnight

## 2017-08-27 NOTE — Progress Notes (Signed)
ANTICOAGULATION CONSULT NOTE - Initial Consult  Pharmacy Consult for heparin Indication: chest pain/ACS  No Known Allergies  Patient Measurements: Height: 5\' 7"  (170.2 cm) Weight: 194 lb (88 kg) IBW/kg (Calculated) : 66.1 Heparin Dosing Weight: 85kg  Vital Signs: Temp: 99.6 F (37.6 C) (01/29 0149) Temp Source: Rectal (01/29 0149) BP: 140/93 (01/29 0230) Pulse Rate: 90 (01/29 0230)  Labs: Recent Labs    08/27/17 0144  HGB 12.6*  HCT 39.4  PLT 183  CREATININE 1.70*    Estimated Creatinine Clearance: 42.2 mL/min (A) (by C-G formula based on SCr of 1.7 mg/dL (H)).   Medical History: Past Medical History:  Diagnosis Date  . AICD (automatic cardioverter/defibrillator) present 10/18/2016  . Anemia   . Arthritis    hip, hands- Osteoarthritis    . CAD (coronary artery disease)   . CKD (chronic kidney disease) stage 3, GFR 30-59 ml/min (HCC)    [name]  . Diverticulosis   . Dyslipidemia   . GERD (gastroesophageal reflux disease)   . Hypertension    g. taylor  . ICD (implantable cardiac defibrillator) in place    2009-St. Jude device  . Neuromuscular disorder (HCC)    neuropathy- in hands  . Pacemaker   . Shortness of breath   . Sleep apnea    started a sleep study but didn't completed, states PCP told him he has sleep  apnea, doesn't use CPAP  . Systolic dysfunction   . Type 2 diabetes with nephropathy ([place])   . Vitamin D deficiency     Assessment: 72yo male c/o worsening SOB, troponin found to be elevated, to begin heparin.  Goal of Therapy:  Heparin level 0.3-0.7 units/ml Monitor platelets by anticoagulation protocol: Yes   Plan:  Will give heparin 4000 units IV bolus x1 followed by gtt at 1100 units/hr and monitor heparin levels and CBC.  Wynona Neat, PharmD, BCPS  08/27/2017,2:41 AM

## 2017-08-27 NOTE — ED Notes (Signed)
Admitting at bedside requesting pt has no food or drink until cardiology is in to see pt. Pt verbalizes understanding

## 2017-08-27 NOTE — Progress Notes (Signed)
   Pt St Jude device interrogated and reviewed.   Pt had 3 episodes of VT on 01/27 that were VT2 because the rate was >200 bpm.  Per device programming, it did ATP x 1, then shock if not successful. The first episode was successfully treated with ATP. The next 2 episodes were treated w/ ATP, but not successful. He was shocked twice, each shock was successful. After the 2nd shock, no recurrence of sustained VT.  On telemetry review, he has had PVCs and pairs.  Dr Stanford Breed advised amiodarone.  Rosaria Ferries, PA-C 08/27/2017 3:21 PM Beeper [phone]

## 2017-08-27 NOTE — ED Notes (Signed)
Amiodarone running at 60mg /hr as ordered and to be changed to 30mg /hr today at 10 am per Dr. Tamala Julian order.

## 2017-08-27 NOTE — Progress Notes (Signed)
ANTICOAGULATION CONSULT NOTE - Follow-up Consult  Pharmacy Consult for heparin Indication: chest pain/ACS  No Known Allergies  Patient Measurements: Height: 5\' 7"  (170.2 cm) Weight: 194 lb (88 kg) IBW/kg (Calculated) : 66.1 Heparin Dosing Weight: 85kg  Vital Signs: Temp: 97.6 F (36.4 C) (01/29 1947) Temp Source: Oral (01/29 1947) BP: 93/56 (01/29 1947) Pulse Rate: 61 (01/29 1947)  Labs: Recent Labs    08/27/17 0144  08/27/17 0357 08/27/17 1020 08/27/17 1518 08/27/17 2053  HGB 12.6*  --   --   --   --   --   HCT 39.4  --   --   --   --   --   PLT 183  --   --   --   --   --   HEPARINUNFRC  --   --   --  0.34  --  <0.10*  CREATININE 1.70*  --   --   --   --   --   TROPONINI  --    < > 7.84* 11.38* 11.77*  --    < > = values in this interval not displayed.    Estimated Creatinine Clearance: 42.2 mL/min (A) (by C-G formula based on SCr of 1.7 mg/dL (H)).  Assessment: 72yo male c/o worsening SOB, troponin found to be elevated  Hep lvl undetectable after previous therapeutic level Spoke with RN who said iv infiltrated/was leaking drug for unknown time but could have been for ~ 1 hr  Heparin has been running since ~ 2055  Goal of Therapy:  Heparin level 0.3-0.7 units/ml Monitor platelets by anticoagulation protocol: Yes   Plan:  Continue current rate 1200 units/hr heparin Re-order level for 0000  Levester Fresh, PharmD, BCPS, BCCCP Clinical Pharmacist Clinical phone for 08/27/2017 from 1430 [phone]: [phone] If after 2300, please call main pharmacy at: [phone] 08/27/2017 10:09 PM

## 2017-08-27 NOTE — Progress Notes (Signed)
Responded to page Pt  With  ICD  Shock  IV  amio gtt, mag IV ordered IV  Diuresis advised  And  Will follow up  Along with IM admit  Although no chest pain  ,  Troponin  elevationapears to be VT  Related due to cardiomyopathy ICD  interrogation and   History of ICM  hep gtt added, and  Elevated enzymes  Vs  NSTEMI, - will further  eval    Will follow up  Along  With IM ,    Pt  Has  Significant  Respiratory issues, on  bipap  As  Well.   Will discuss  With cards  AM team  And sign out

## 2017-08-27 NOTE — ED Triage Notes (Signed)
Pt states he has a very hard time to cash his bread for the past few days getting worse tonight, denies any pain at this time. Pt states he has a ICD that fire up few days ago. No fever or chills.

## 2017-08-27 NOTE — ED Notes (Signed)
Dr. Leonides Schanz advised RN that we can collect blood specimen at lower extremities.

## 2017-08-27 NOTE — Progress Notes (Signed)
Patient arrived the unit from ED, assessment completed see flow sheet patient oriented to room and staff, placed on tele ccmd notified, call bell within reach will continue to monitor.

## 2017-08-27 NOTE — Progress Notes (Signed)
ANTICOAGULATION CONSULT NOTE - Follow-up Consult  Pharmacy Consult for heparin Indication: chest pain/ACS  No Known Allergies  Patient Measurements: Height: 5\' 7"  (170.2 cm) Weight: 194 lb (88 kg) IBW/kg (Calculated) : 66.1 Heparin Dosing Weight: 85kg  Vital Signs: Temp: 99.6 F (37.6 C) (01/29 0149) Temp Source: Rectal (01/29 0149) BP: 108/68 (01/29 1245) Pulse Rate: 58 (01/29 1245)  Labs: Recent Labs    08/27/17 0144 08/27/17 0215 08/27/17 0357 08/27/17 1020  HGB 12.6*  --   --   --   HCT 39.4  --   --   --   PLT 183  --   --   --   HEPARINUNFRC  --   --   --  0.34  CREATININE 1.70*  --   --   --   TROPONINI  --  10.29* 7.84* 11.38*    Estimated Creatinine Clearance: 42.2 mL/min (A) (by C-G formula based on SCr of 1.7 mg/dL (H)).   Medical History: Past Medical History:  Diagnosis Date  . AICD (automatic cardioverter/defibrillator) present 10/18/2016  . Anemia   . Arthritis    hip, hands- Osteoarthritis    . CAD (coronary artery disease)   . CKD (chronic kidney disease) stage 3, GFR 30-59 ml/min (HCC)    [name]  . Diverticulosis   . Dyslipidemia   . GERD (gastroesophageal reflux disease)   . Hypertension   . ICD (implantable cardiac defibrillator) in place    2009-St. Jude device, Dr Lovena Le  . Neuromuscular disorder (HCC)    neuropathy- in hands  . Pacemaker   . Shortness of breath   . Sleep apnea    started a sleep study but didn't completed, states PCP told him he has sleep  apnea, doesn't use CPAP  . Systolic dysfunction   . Type 2 diabetes with nephropathy ([place])   . Vitamin D deficiency     Assessment: 72yo male c/o worsening SOB, troponin found to be elevated, to begin heparin.  Heparin therapeutic at 0.34 but at the lower end despite 4000 unit bolus.  Troponin trending up.  Goal of Therapy:  Heparin level 0.3-0.7 units/ml Monitor platelets by anticoagulation protocol: Yes   Plan:  -Increase heparin rate to 1200 units/hr (increase in  1 unit/kg/hr) -Monitor daily heparin level, daily CBC, and signs/symptoms of bleeding  Thank you for allowing [place] to participate in this patients care.  Mariella Saa, PharmD 08/27/2017 12:57 PM

## 2017-08-27 NOTE — Progress Notes (Signed)
CRITICAL VALUE ALERT  Critical Value:  Troponin   Date & Time Notied:  08/27/2017 @ 8329  Provider Notified: Allyson Sabal MD  Orders Received/Actions taken: Vital signs remain stable , pt denies chest pain, will continue to monitor.

## 2017-08-27 NOTE — H&P (Addendum)
History and Physical    Randy [name] ZES:[id number] DOB: 12-May-1946 DOA: 08/27/2017  Referring MD/NP/PA: Dr. Cyril Mourning [name] PCP: Randy Passy, MD  Patient coming from: Home via EMS  Chief Complaint: Shortness of breath  I have personally briefly reviewed patient's old medical records in [place]   HPI: Randy [name] is a 72 y.o. male with medical history significant of HTN, HLD, CHF last EF 30-35% in  09/2016, s/p AICD followed by Randy [name], DM type II; who presents with complaints of acute worsening shortness of breath started last night.  Patient reports that he has been short of breath for many weeks for which he reports being unable to lie flat.  He has been utilizing his home inhaler without significant relief.  At baseline patient he does not wear home oxygen.  Notes that 2 days ago his defibrillator went off twice.  Denies having any loss of consciousness or falls with these events.  Denies having any fever, chest pain, leg swelling, abdominal distention, nausea, vomiting, or diarrhea.   ED Course: Upon admission into the emergency department patient was found to be afebrile, pulse 77-97, respirations 20-29, blood pressure 112/95-167/102, and O2 saturation noted be as low as 77% on room air.  Patient was placed on BiPAP.  Labs revealed WBC 10.9, hemoglobin 12.6, BUN 30, creatinine 1.7, glucose 172, troponin 10.29.  Chest x-ray showed new interstitial pattern consistent with edema.  Patient was given 40 mg of Lasix IV. Dr. Therisa Doyne Cardiology was consulted and recommended the patient be placed on heparin drip with amiodarone.  AICD to be interrogated.  TRH called to admit.   Review of Systems  Constitutional: Negative for chills, fever and weight loss.  HENT: Negative for ear discharge and nosebleeds.   Eyes: Negative for double vision and photophobia.  Respiratory: Positive for shortness of breath and wheezing.   Cardiovascular: Positive for orthopnea. Negative for leg  swelling.  Gastrointestinal: Negative for abdominal pain, nausea and vomiting.  Genitourinary: Negative for dysuria and hematuria.  Musculoskeletal: Negative for falls.  Skin: Negative for itching and rash.  Neurological: Negative for speech change and focal weakness.  Psychiatric/Behavioral: Negative for substance abuse. The patient is not nervous/anxious.     Past Medical History:  Diagnosis Date  . AICD (automatic cardioverter/defibrillator) present 10/18/2016  . Anemia   . Arthritis    hip, hands- Osteoarthritis    . CAD (coronary artery disease)   . CKD (chronic kidney disease) stage 3, GFR 30-59 ml/min (HCC)    [name]  . Diverticulosis   . Dyslipidemia   . GERD (gastroesophageal reflux disease)   . Hypertension    Randy [name]  . ICD (implantable cardiac defibrillator) in place    2009-[employer] device  . Neuromuscular disorder (HCC)    neuropathy- in hands  . Pacemaker   . Shortness of breath   . Sleep apnea    started a sleep study but didn't completed, states PCP told him he has sleep  apnea, doesn't use CPAP  . Systolic dysfunction   . Type 2 diabetes with nephropathy (Randy [name])   . Vitamin D deficiency     Past Surgical History:  Procedure Laterality Date  . CERVICAL FUSION    . ICD IMPLANT N/A 10/18/2016   Procedure: ICD Implant- Upgrade to Dual Chamber;  Surgeon: Randy Lance, MD;  Location: [facility];  Service: Cardiovascular;  Laterality: N/A;  . INSERT / REPLACE / REMOVE PACEMAKER    .  left carpal tunnel release Left   . left rotator cuff surgery Left   . LUMBAR LAMINECTOMY Left 07/17/2013   Procedure: MICRODISCECTOMY LUMBAR LAMINECTOMY;  Surgeon: Randy Killings, MD;  Location: [facility];  Service: Orthopedics;  Laterality: Left;  Left L4 Laminotomy, Lateral Recess Decompression, Cyst Excision  . TOTAL HIP ARTHROPLASTY Left 09/17/2012   Procedure: Left TOTAL HIP ARTHROPLASTY ANTERIOR APPROACH;  Surgeon: Randy Killings, MD;  Location: [facility];  Service:  Orthopedics;  Laterality: Left;     reports that he quit smoking about 33 years ago. His smoking use included cigarettes. he has never used smokeless tobacco. He reports that he drinks alcohol. He reports that he does not use drugs.  No Known Allergies  Family History  Problem Relation Age of Onset  . Heart attack Father 67  . Diabetes Brother     Prior to Admission medications   Medication Sig Start Date End Date Taking? Authorizing Provider  albuterol (PROVENTIL HFA;VENTOLIN HFA) 108 (90 Base) MCG/ACT inhaler Inhale 2 puffs into the lungs every 6 (six) hours as needed for wheezing or shortness of breath. 02/11/17  Yes Randy Passy, MD  aspirin EC 81 MG tablet Take 81 mg by mouth daily.   Yes [provider]  carvedilol (COREG) 12.5 MG tablet TAKE 1 TABLET TWICE DAILY WITH A MEAL 06/03/17  Yes Randy [name], Randy Rinks, MD  furosemide (LASIX) 20 MG tablet Take 1 tablet (20 mg total) by mouth daily. 04/24/17  Yes Randy Passy, MD  gabapentin (NEURONTIN) 300 MG capsule Take 2 capsules (600 mg total) by mouth 2 (two) times daily. 03/21/17  Yes Randy Passy, MD  glipiZIDE (GLUCOTROL) 5 MG tablet TAKE 1 TABLET EVERY DAY BEFORE BREAKFAST 06/25/17  Yes Randy Passy, MD  lisinopril (PRINIVIL,ZESTRIL) 2.5 MG tablet TAKE 1 TABLET EVERY DAY 06/25/17  Yes Randy Passy, MD  lovastatin (MEVACOR) 20 MG tablet TAKE 1 TABLET AT BEDTIME 06/25/17  Yes Randy Passy, MD  meloxicam (MOBIC) 15 MG tablet Take 1 tablet (15 mg total) by mouth daily. 04/24/17  Yes Randy Passy, MD  tamsulosin (FLOMAX) 0.4 MG CAPS capsule TAKE 1 CAPSULE EVERY DAY 06/25/17  Yes Randy Passy, MD  ACCU-CHEK SOFTCLIX LANCETS lancets Use as instructed to check blood sugar once daily and as needed.  Diagnosis:  E11.21  Non- insulin dependent. 07/24/16   Randy Ghent, MD  Alcohol Swabs (B-D SINGLE USE SWABS REGULAR) PADS Use as instructed to clean area for glucose monitoring once daily and as needed.  Diagnosis:  E11.21  Non  insulin-dependent 07/24/16   Randy Ghent, MD  Blood Glucose Calibration (ACCU-CHEK AVIVA) SOLN Use to calibrate blood glucose machine as recommended.  Diagnosis:  E11.21  Non-insulin dependent. 07/24/16   Randy Ghent, MD  Blood Glucose Monitoring Suppl (ACCU-CHEK AVIVA) device Use to check blood sugar once daily.  Dx: E11.21 09/25/16 09/25/17  Randy Passy, MD  glucose blood (ACCU-CHEK AVIVA PLUS) test strip Test Blood Sugar 1 time daily: Dx:E11.27 07/31/17   Randy Passy, MD    Physical Exam:  Constitutional: Elderly male in some mild respiratory distress Vitals:   08/27/17 0156 08/27/17 0200 08/27/17 0215 08/27/17 0230  BP: (!) 167/102 (!) 150/77 (!) 142/81 (!) 140/93  Pulse: 96 89 90 90  Resp: (!) 26 (!) 22 (!) 22 (!) 25  Temp:      TempSrc:      SpO2: 95% 100% 99% 100%  Weight:  Height:       Eyes: PERRL, lids and conjunctivae normal ENMT: Mucous membranes are moist. Posterior pharynx clear of any exudate or lesions.Normal dentition.  Neck: normal, supple, no masses, no thyromegaly Respiratory: Patient still tachypneic currently on BiPAP with expiratory wheezes and crackles appreciated. Cardiovascular: Regular rate and rhythm. Abdomen: no tenderness, no masses palpated. No hepatosplenomegaly. Bowel sounds positive.  Musculoskeletal: no clubbing / cyanosis. No joint deformity upper and lower extremities. Good ROM, no contractures. Normal muscle tone.  Skin: no rashes, lesions, ulcers. No induration Neurologic: CN 2-12 grossly intact. Sensation intact, DTR normal. Strength 5/5 in all 4.  Psychiatric: Normal judgment and insight. Alert and oriented x 3. Normal mood.     Labs on Admission: I have personally reviewed following labs and imaging studies  CBC: Recent Labs  Lab 08/27/17 0144  WBC 10.9*  HGB 12.6*  HCT 39.4  MCV 89.7  PLT 833   Basic Metabolic Panel: Recent Labs  Lab 08/27/17 0144 08/27/17 0235  NA 137  --   K 5.1  --   CL 104  --   CO2 22   --   GLUCOSE 172*  --   BUN 30*  --   CREATININE 1.70*  --   CALCIUM 9.1  --   MG  --  2.0   GFR: Estimated Creatinine Clearance: 42.2 mL/min (A) (by C-G formula based on SCr of 1.7 mg/dL (H)). Liver Function Tests: No results for input(s): AST, ALT, ALKPHOS, BILITOT, PROT, ALBUMIN in the last 168 hours. No results for input(s): LIPASE, AMYLASE in the last 168 hours. No results for input(s): AMMONIA in the last 168 hours. Coagulation Profile: No results for input(s): INR, PROTIME in the last 168 hours. Cardiac Enzymes: Recent Labs  Lab 08/27/17 0215  TROPONINI 10.29*   BNP (last 3 results) No results for input(s): PROBNP in the last 8760 hours. HbA1C: No results for input(s): HGBA1C in the last 72 hours. CBG: No results for input(s): GLUCAP in the last 168 hours. Lipid Profile: No results for input(s): CHOL, HDL, LDLCALC, TRIG, CHOLHDL, LDLDIRECT in the last 72 hours. Thyroid Function Tests: No results for input(s): TSH, T4TOTAL, FREET4, T3FREE, THYROIDAB in the last 72 hours. Anemia Panel: No results for input(s): VITAMINB12, FOLATE, FERRITIN, TIBC, IRON, RETICCTPCT in the last 72 hours. Urine analysis:    Component Value Date/Time   COLORURINE YELLOW 09/15/2012 1326   APPEARANCEUR CLOUDY (A) 09/15/2012 1326   LABSPEC 1.019 09/15/2012 1326   PHURINE 6.0 09/15/2012 1326   GLUCOSEU NEGATIVE 09/15/2012 1326   HGBUR NEGATIVE 09/15/2012 1326   BILIRUBINUR neg 09/10/2016 0911   KETONESUR NEGATIVE 09/15/2012 1326   PROTEINUR neg 09/10/2016 0911   PROTEINUR 30 (A) 09/15/2012 1326   UROBILINOGEN 0.2 09/10/2016 0911   UROBILINOGEN 1.0 09/15/2012 1326   NITRITE neg 09/10/2016 0911   NITRITE NEGATIVE 09/15/2012 1326   LEUKOCYTESUR Negative 09/10/2016 0911   Sepsis Labs: No results found for this or any previous visit (from the past 240 hour(s)).   Radiological Exams on Admission: Dg Chest Portable 1 View  Result Date: 08/27/2017 CLINICAL DATA:  Shortness of breath EXAM:  PORTABLE CHEST 1 VIEW COMPARISON:  03/19/2017 FINDINGS: Cardiac pacemaker. Postoperative changes in the cervical spine. Heart size and pulmonary vascularity are normal. Diffuse interstitial pattern to the lungs has developed since previous study, likely indicating interstitial infiltration due to edema or pneumonia. No focal consolidation. No blunting of costophrenic angles. No pneumothorax. Mediastinal contours appear intact. Calcification of the aorta. IMPRESSION:  New interstitial pattern to the lungs may represent interstitial pneumonitis or edema. Heart size is normal. No focal consolidation. Electronically Signed   By: Lucienne Capers M.D.   On: 08/27/2017 02:15    EKG: Independently reviewed. Sinus rhythm with nonspecific anterior ventricular conduction delay  Assessment/Plan  Respiratory failure with hypoxia 2/2 systolic CHF exacerbation: Acute.  Patient presented with acute shortness of breath with O2 saturations noted to be as low as 77% on room air.  Patient was placed to BiPAP with improvement respiratory distress.  Chest x-ray showing signs of pulmonary edema.  Last known EF noted to be 35-40% by echocardiogram done in March 2018.  Differential includes CHF exacerbation vs arrythmia. - Admit to a telemetry bed - Heart failure orders set  initiated  - Continue BiPAP - Continuous pulse oximetry with nasal cannula oxygen as needed to keep O2 saturations >92% - Strict I&Os and daily weights - Lasix 40 mg IV bid  - Reassess in a.m. and adjust diuresis as needed. - Check echocardiogram - Optimize medical management as able - [facility] consultative services, will follow-up for further recommendations  - Duonebs   S/p AICD, suspected arrhythmia: Patient follow-up in the outpatient setting by Randy [name].  Cardiology consulted and recommending patient be placed on amiodarone drip. - Continue amiodarone drip per cardiology recommendations - Follow-up interrogation of  AICD  NSTEMI: Acute.  Patient present with complaints of shortness of breath found to have elevated troponin up to 10.29. - Trend cardiac troponins - Heparin drip per pharmacy  Leukocytosis: WBC elevated 10.9.  Suspect likely reactive and less likely infectious in nature. - Recheck CBC in a.m.  Essential hypertension  - Initially held lisinopril, restart when medically appropriate - Continue Coreg  Chronic kidney disease stage III: Stable.  Patient's initial creatinine noted to be 1.7 with BUN 30.  Baseline creatinine noted to be around 1.7-1.9 - Continue to monitor  Diabetes mellitus type 2: Patient's last hemoglobin A1c noted to be 7.4 on 04/22/2017. - Hypoglycemic protocols - Hold glipizide - CBGs with meals with sensitive SSI  DVT prophylaxis:  heparin Code Status: full Family Communication: discussed plan of care with the patient family present at bedside Disposition Plan: TBD Consults called: cardiology  Admission status: inpatient   Norval Morton MD [facility] Pager [phone]   If 7PM-7AM, please contact night-coverage [email or web address] Password [email or web address]  08/27/2017, 3:00 AM

## 2017-08-27 NOTE — Consult Note (Signed)
Cardiology Consultation:   Patient ID: LEVI KLAIBER; [id number]; [birth year]   Admit date: 08/27/2017 Date of Consult: 08/27/2017  Primary Care Provider: Lucille Passy, MD Primary Cardiologist: Dr Haroldine Laws 2010 Primary Electrophysiologist:  Dr. Larae Grooms, 06/19/2017   Patient Profile:   ANGELLO CHIEN is a 72 y.o. male with a hx of 2v CAD w/ med rx, chronic systolic CHF with AICD placement in 09/2016, HTN, HLD, T2DM, and CKD stage III and  who is being seen today for the evaluation of shortness of breath at the request of Dr. Allyson Sabal.  History of Present Illness:   Mr. Portal presented to the ED earlier this morning (around 1-2am) with acute onset of shortness of breath 3-4 hours prior to arrival. He was in respiratory distress with tachypnea and hypoxia (SpO2 86%) on arrival. He was placed on BiPAP. BNP was 883.5. Chest x-ray showed diffuse interstitial pattern to the lung, likely pulmonary edema. He has received a Duoneb, albuterol nebulizer, and 2 rounds of Lasix40mg  IV in the ED. He states he is breathing a lot better now.  I-stat Troponin was 5.58 at 1:55. Repeat Troponins were 10.29 at 2:15am, and 7,84 at 3:57am. He has denied any chest pain in the ED. Cardiology fellow was consulted and recommended starting Amiodarone and Heparin given history of recurrent VT.  He reports he was unable to fall asleep last night because of his shortness of breath, describes orthopnea and PND.  He does have chronic DOE at baseline with minimal activity such as walking to mailbox, feeding dogs, and bending over. However, he reports worsening shortness of breath over the last few days.   He was playing golf 2 days ago and walking a lot, when he all of a sudden got very short of breath and then he felt his AICD fire twice. He does not usually have any orthopnea, PND, or lower extremity edema. He denies and prodrome to the shocks of palpitations, CP or presyncope.  He is very compliant with his home  medications. He monitors weight frequently but not daily and watches his Na intake. However, he does state he drinks a lot of fluid (7-8 large coffee cups per day).   He denies any recent chest pain at rest or with exertion. Although he does report a mild pain behind his right shoulder blade that he first noticed 2 days ago when he was golfing. He also denies any nausea or diaphoresis.   Past Medical History:  Diagnosis Date  . AICD (automatic cardioverter/defibrillator) present 10/18/2016  . Anemia   . Arthritis    hip, hands- Osteoarthritis    . CAD (coronary artery disease)   . CKD (chronic kidney disease) stage 3, GFR 30-59 ml/min (HCC)    [name]  . Diverticulosis   . Dyslipidemia   . GERD (gastroesophageal reflux disease)   . Hypertension   . ICD (implantable cardiac defibrillator) in place    2009-[employer] Jude device, Dr Lovena Le  . Neuromuscular disorder (HCC)    neuropathy- in hands  . Pacemaker   . Shortness of breath   . Sleep apnea    started a sleep study but didn't completed, states PCP told him he has sleep  apnea, doesn't use CPAP  . Systolic dysfunction   . Type 2 diabetes with nephropathy ([place])   . Vitamin D deficiency     Past Surgical History:  Procedure Laterality Date  . CERVICAL FUSION    . ICD IMPLANT N/A 10/18/2016   Procedure: ICD Implant- Upgrade  to Dual Chamber;  Surgeon: Evans Lance, MD;  Location: [facility];  Service: Cardiovascular;  Laterality: N/A;  . INSERT / REPLACE / REMOVE PACEMAKER    . left carpal tunnel release Left   . left rotator cuff surgery Left   . LUMBAR LAMINECTOMY Left 07/17/2013   Procedure: MICRODISCECTOMY LUMBAR LAMINECTOMY;  Surgeon: Marybelle Killings, MD;  Location: [facility];  Service: Orthopedics;  Laterality: Left;  Left L4 Laminotomy, Lateral Recess Decompression, Cyst Excision  . TOTAL HIP ARTHROPLASTY Left 09/17/2012   Procedure: Left TOTAL HIP ARTHROPLASTY ANTERIOR APPROACH;  Surgeon: Marybelle Killings, MD;  Location: [facility];   Service: Orthopedics;  Laterality: Left;     Prior to Admission medications   Medication Sig Start Date End Date Taking? Authorizing Provider  albuterol (PROVENTIL HFA;VENTOLIN HFA) 108 (90 Base) MCG/ACT inhaler Inhale 2 puffs into the lungs every 6 (six) hours as needed for wheezing or shortness of breath. 02/11/17  Yes Lucille Passy, MD  aspirin EC 81 MG tablet Take 81 mg by mouth daily.   Yes [provider]  carvedilol (COREG) 12.5 MG tablet TAKE 1 TABLET TWICE DAILY WITH A MEAL 06/03/17  Yes Allred, Jeneen Rinks, MD  furosemide (LASIX) 20 MG tablet Take 1 tablet (20 mg total) by mouth daily. 04/24/17  Yes Lucille Passy, MD  gabapentin (NEURONTIN) 300 MG capsule Take 2 capsules (600 mg total) by mouth 2 (two) times daily. 03/21/17  Yes Lucille Passy, MD  glipiZIDE (GLUCOTROL) 5 MG tablet TAKE 1 TABLET EVERY DAY BEFORE BREAKFAST 06/25/17  Yes Lucille Passy, MD  lisinopril (PRINIVIL,ZESTRIL) 2.5 MG tablet TAKE 1 TABLET EVERY DAY 06/25/17  Yes Lucille Passy, MD  lovastatin (MEVACOR) 20 MG tablet TAKE 1 TABLET AT BEDTIME 06/25/17  Yes Lucille Passy, MD  meloxicam (MOBIC) 15 MG tablet Take 1 tablet (15 mg total) by mouth daily. 04/24/17  Yes Lucille Passy, MD  tamsulosin (FLOMAX) 0.4 MG CAPS capsule TAKE 1 CAPSULE EVERY DAY 06/25/17  Yes Lucille Passy, MD  ACCU-CHEK SOFTCLIX LANCETS lancets Use as instructed to check blood sugar once daily and as needed.  Diagnosis:  E11.21  Non- insulin dependent. 07/24/16   Tonia Ghent, MD  Alcohol Swabs (B-D SINGLE USE SWABS REGULAR) PADS Use as instructed to clean area for glucose monitoring once daily and as needed.  Diagnosis:  E11.21  Non insulin-dependent 07/24/16   Tonia Ghent, MD  Blood Glucose Calibration (ACCU-CHEK AVIVA) SOLN Use to calibrate blood glucose machine as recommended.  Diagnosis:  E11.21  Non-insulin dependent. 07/24/16   Tonia Ghent, MD  Blood Glucose Monitoring Suppl (ACCU-CHEK AVIVA) device Use to check blood sugar once daily.   Dx: E11.21 09/25/16 09/25/17  Lucille Passy, MD  glucose blood (ACCU-CHEK AVIVA PLUS) test strip Test Blood Sugar 1 time daily: Dx:E11.27 07/31/17   Lucille Passy, MD    Inpatient Medications: Scheduled Meds: . aspirin EC  81 mg Oral Daily  . carvedilol  12.5 mg Oral BID WC  . furosemide  40 mg Intravenous BID  . gabapentin  600 mg Oral BID  . insulin aspart  0-9 Units Subcutaneous TID WC  . ipratropium-albuterol  3 mL Nebulization QID  . sodium chloride flush  3 mL Intravenous Q12H   Continuous Infusions: . sodium chloride    . amiodarone     Followed by  . amiodarone    . heparin 1,100 Units/hr (08/27/17 0311)   PRN  Meds: sodium chloride, acetaminophen, ipratropium-albuterol, ondansetron (ZOFRAN) IV, sodium chloride flush  Allergies:   No Known Allergies  Social History:   Social History   Socioeconomic History  . Marital status: Single    Spouse name: Not on file  . Number of children: 2  . Years of education: Not on file  . Highest education level: Not on file  Social Needs  . Financial resource strain: Not on file  . Food insecurity - worry: Not on file  . Food insecurity - inability: Not on file  . Transportation needs - medical: Not on file  . Transportation needs - non-medical: Not on file  Occupational History  . 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 Smoking status: Former Smoker    Types: Cigarettes    Last attempt to quit: 07/30/1984    Years since quitting: 33.0  . Smokeless tobacco: Never Used  Substance and Sexual Activity  . Alcohol use: Yes    Comment: occ  . Drug use: No  . Sexual activity: No  Other Topics Concern  . Not on file  Social History Narrative   Does not have a living will.   Desires CPR, does want life support if not futile.       Family History:   Family History  Problem Relation Age of Onset  . Heart attack Father 25  . Diabetes Brother    Family Status:  Family Status    Relation Name Status  . Father  (Not Specified)  . Brother  (Not Specified)    ROS:  Please see the history of present illness.  All other ROS reviewed and negative.     Physical Exam/Data:   Vitals:   08/27/17 0445 08/27/17 0530 08/27/17 0700 08/27/17 0715  BP: 103/67 101/68 110/71 109/68  Pulse: 72 66 64 66  Resp: 19 19 17 20   Temp:      TempSrc:      SpO2: 98% 95% 97% 98%  Weight:      Height:        Intake/Output Summary (Last 24 hours) at 08/27/2017 0740 Last data filed at 08/27/2017 0553 Gross per 24 hour  Intake 100 ml  Output 650 ml  Net -550 ml   Filed Weights   08/27/17 0145  Weight: 194 lb (88 kg)   Body mass index is 30.38 kg/m.  General:  72 y.o. Caucasian male lying comfortably in bed in no acute distress HEENT: normal Lymph: no adenopathy Neck: no JVD appreciated, difficult to assess 2nd body habitus Endocrine:  No thryomegaly Vascular: No carotid bruits; 4/4 extremity pulses 2+, without bruits  Cardiac:  normal S1, S2; RRR; no murmur  Lungs: Decreased breath sounds with minimal increased work of breathing, bibasilar rales appreciated, no wheezing or rhonchi Abd: soft, nontender, no hepatomegaly  Ext: no edema Musculoskeletal:  No deformities, BUE and BLE strength normal and equal Skin: warm and dry  Neuro:  CNs 2-12 intact, no focal abnormalities noted Psych:  Normal affect   EKG:  The EKG was personally reviewed and demonstrates:  SR, QRS duration 122 ms (previously 86 ms), PVCs seen Telemetry:  Telemetry was personally reviewed and demonstrates:  Sinus rhythm with PVCs and pairs   Relevant CV Studies:  ECHO 10/11/2016: Study Conclusions - Left ventricle: The cavity size was normal. Systolic function was   moderately reduced. The estimated ejection fraction was in the   range of 35% to  40%. Diffuse hypokinesis worse in the anterior   and anterolateral wall. Doppler parameters are consistent with   abnormal left ventricular relaxation  (grade 1 diastolic   dysfunction). Doppler parameters are consistent with   indeterminate ventricular filling pressure. - Aortic valve: Transvalvular velocity was within the normal range.   There was no stenosis. There was no regurgitation. - Mitral valve: Transvalvular velocity was within the normal range.   There was no evidence for stenosis. There was no regurgitation. - Right ventricle: The cavity size was normal. Wall thickness was   normal. Systolic function was normal. - Tricuspid valve: There was mild regurgitation. - Pulmonary arteries: Systolic pressure was within the normal   range. PA peak pressure: 33 mm Hg (S).  MYOVIEW 06/05/2012: - No evidence of ischemia and otherwise low risk.  CATH: 2009 Left main was calcified.  There was a 50% distal lesion.   LAD was a long vessel, wrapping the apex, gave off 2 diagonals.  It is  heavily calcified in the proximal section.  There is a 50% proximal  lesion and 40% tubular lesion in the midsection.   Left circumflex gave off a fairly large ramus branch.  There was a 95%  complex lesion at the ostium of the left circumflex, involving the  ostium of the ramus.  It was a bit difficult to tell exactly how much of  the ramus was involved.  The remainder of the left circumflex was  heavily diseased with a 90% lesion in the midsection and 2 occluded  marginal branches.  The distal AV groove circumflex was small and very  heavily diseased.   Right coronary artery had a 95% proximal lesion.  It was then totally  occluded in the midsection.  There were bridging right to right  collaterals with faint filling of the distal right coronary bed.   Left ventriculogram done in the RAO position showed inferior akinesis  with an ejection fraction of 25%.  There is a question of an abdominal  aortic aneurysm.  On panning down over the aorta, this was not seen  well.   Left subclavian angiography showed mild plaquing of left subclavian  with  no high-grade lesions.  The LIMA was patent to the chest wall.   ASSESSMENT:  1. Severe 2-vessel coronary artery disease as described above.  2. Severe left ventricular dysfunction.  3. Possible abdominal aortic aneurysm.   I have reviewed the films with Dr. Olevia Perches.  He does have severe ostial  left circumflex disease involving the ramus; however, the circumflex  does not have any graftable targets.  The ramus is a moderate to large  vessel.  The LAD just has moderate nonobstructive disease and the RCA is  chronically occluded with total akinesis of the inferior wall.  At this  point, I do not think he would benefit significantly from surgical  revascularization.  He is likely at high risk for sudden cardiac death  and will need a referral for defibrillator.  We will also get an  abdominal ultrasound to further evaluate for abdominal aortic aneurysm.   Laboratory Data:  Chemistry Recent Labs  Lab 08/27/17 0144  NA 137  K 5.1  CL 104  CO2 22  GLUCOSE 172*  BUN 30*  CREATININE 1.70*  CALCIUM 9.1  GFRNONAA 39*  GFRAA 45*  ANIONGAP 11    Lab Results  Component Value Date   ALT 11 04/22/2017   AST 14 04/22/2017   ALKPHOS 44 04/22/2017   BILITOT  0.5 04/22/2017   Hematology Recent Labs  Lab 08/27/17 0144  WBC 10.9*  RBC 4.39  HGB 12.6*  HCT 39.4  MCV 89.7  MCH 28.7  MCHC 32.0  RDW 13.7  PLT 183   Cardiac Enzymes Recent Labs  Lab 08/27/17 0215 08/27/17 0357  TROPONINI 10.29* 7.84*    Recent Labs  Lab 08/27/17 0155  TROPIPOC 5.58*    BNP Recent Labs  Lab 08/27/17 0215  BNP 883.5*    TSH:  Lab Results  Component Value Date   TSH 2.113 08/27/2017   Lipids: Lab Results  Component Value Date   CHOL 198 04/22/2017   HDL 28.80 (L) 04/22/2017   LDLCALC 115 (H) 03/09/2016   LDLDIRECT 97.0 04/22/2017   TRIG (H) 04/22/2017    426.0 Triglyceride is over 400; calculations on Lipids are invalid.   CHOLHDL 7 04/22/2017   HgbA1c: Lab  Results  Component Value Date   HGBA1C 7.4 (H) 04/22/2017   Magnesium:  Magnesium  Date Value Ref Range Status  08/27/2017 2.0 1.7 - 2.4 mg/dL Final     Radiology/Studies:  Dg Chest Portable 1 View  Result Date: 08/27/2017 CLINICAL DATA:  Shortness of breath EXAM: PORTABLE CHEST 1 VIEW COMPARISON:  03/19/2017 FINDINGS: Cardiac pacemaker. Postoperative changes in the cervical spine. Heart size and pulmonary vascularity are normal. Diffuse interstitial pattern to the lungs has developed since previous study, likely indicating interstitial infiltration due to edema or pneumonia. No focal consolidation. No blunting of costophrenic angles. No pneumothorax. Mediastinal contours appear intact. Calcification of the aorta. IMPRESSION: New interstitial pattern to the lungs may represent interstitial pneumonitis or edema. Heart size is normal. No focal consolidation. Electronically Signed   By: Lucienne Capers M.D.   On: 08/27/2017 02:15    Assessment and Plan:   Principal Problem:  1. Acute exacerbation of CHF (congestive heart failure) ([place]) - Patient presented to the ED with acute onset of shortness of breath.  - BNP 883.5. - Chest x-ray showed diffuse interstitial pattern to the lung. No focal consolidation. No blunting of costophrenic angles.  - Patient has been treated with 2 rounds of IV Lasix 40mg  as well as a Duoneb and albuterol inhaler with improvement in breathing. - I-stat Troponin was 5.58 at 1:55. Repeat Troponins were 10.29 at 2:15am, and 7.84 at 3:57am. Patient denies any chest pain/discomfort.  - Continue Lasix and Carvedilol. - Strict I&Os and daily monitoring.  - Restart Lisinopril when able.  Active Problems:  2. Type 2 diabetes with nephropathy (HCC) - Per IM  3. Essential hypertension - BP was 167/102 upon arrival to ED.  - Most recent BP was 110/67  4. Automatic implantable cardioverter-defibrillator in situ - Cardiology fellow was consulted about the elevated  Troponins and recommended starting Amiodarone and Heparin drip given history of recurrent VT. - Will interrogate AICD.    5. Acute respiratory failure with hypoxia ([place]) - Patient presented to ED in respiratory distress and was hypoxic (86) and tachypneic (25) upon arrival. - Patient was started on BiPAP initially>>nasal cannula as he is now breathing much better.   6. Leukocytosis - Per IM  7. Chronic kidney disease, stage III - Cr 1.70, GFR 39 - Continue daily BMP while being diuresed.    For questions or updates, please contact [place] Please consult [email or web address] for contact info under Cardiology/STEMI.   Signed, Darreld Mclean, Student-PA  08/27/2017 7:40 AM  As above, patient seen and examined.  Briefly he is a 72 year old male with  past medical history of coronary artery disease treated medically ischemic cardiomyopathy status post ICD, chronic stage III kidney disease, diabetes mellitus, hypertension, hyperlipidemia for evaluation of acute on chronic systolic congestive heart failure and non-ST elevation myocardial infarction.  Patient was playing golf Sunday and noticed dyspnea with climbing hills which is not unusual.  However his ICD fired twice.  Last evening he went to bed and developed acute shortness of breath in the early a.m. hours.  No chest pain, palpitations or syncope.  No fevers, chills or productive cough.  His dyspnea persisted and he presented to the emergency room.  Chest x-ray shows probable pulmonary edema, BNP is elevated as is troponin.  Cardiology asked to evaluate.  Creatinine is 1.70. BNP 883.  Troponin X 0.29.  Electrocardiogram shows sinus rhythm, incomplete left bundle branch block, nonspecific ST changes.  1 acute on chronic systolic congestive heart failure-patient presented with worsening congestive heart failure that was sudden in onset.  Potentially ischemia mediated.  He will need cardiac catheterization.  I will diurese with 40 mg twice  daily today and reassess tomorrow morning.  I would like to avoid worsening renal function given need for catheterization.    2 Non-ST elevation myocardial infarction-patient's troponin is elevated though he has no chest pain.  Some of elevation may be related to ICD discharge but seems high for this alone.  I think he requires cardiac catheterization.  Note he had moderate disease in his LAD at time of last catheterization in 2009.  The risks and benefits of catheterization including myocardial infarction, CVA and death discussed and he agrees to proceed.  We also discussed the risk of worsening renal insufficiency.  We will follow renal function closely on a daily basis prior to making decision concerning timing.  Continue aspirin, heparin, beta-blocker and statin.  3 ICD discharge-we will have patient's device interrogated.  Amiodarone was initiated but I will discontinue at this point.  4  ischemic cardiomyopathy-continue beta-blocker.  Hold ACE inhibitor in setting of renal insufficiency and need for catheterization.    5 Chronic stage III kidney disease-follow renal function closely with diuresis.  We will need to optimize prior to proceeding with catheterization.    6 diabetes mellitus  Kirk Ruths, MD

## 2017-08-27 NOTE — ED Triage Notes (Signed)
Pt is on the low 84% on RA.

## 2017-08-27 NOTE — ED Notes (Signed)
Cards at bedside

## 2017-08-27 NOTE — ED Provider Notes (Signed)
TIME SEEN: 1:49 AM  CHIEF COMPLAINT: Shortness of breath  HPI: Patient is a 72 year old male with history of CAD, chronic kidney disease, hypertension, diabetes, Ischemic cardiomyopathy with EF of 35-40% on last echocardiogram in March 2018 status post pacemaker/AICD followed by Dr. Lovena Le who presents to the emergency department with shortness of breath.  Reports shortness of breath started 3-4 hours prior to arrival.  Does not wear oxygen at home.  Was previously a smoker but quit over 20 years ago.  Does use an inhaler intermittently but no diagnosis of asthma or COPD.  No lower extremity swelling or pain.  States he is compliant with diuretics and no change in his diet.  Reports 2 days ago patient felt his ICD fired twice.  He did not come to the emergency department then.  He denies any chest pain or chest discomfort.  Denies fever or cough.  ROS: See HPI Constitutional: no fever  Eyes: no drainage  ENT: no runny nose   Cardiovascular:  no chest pain  Resp:  SOB  GI: no vomiting GU: no dysuria Integumentary: no rash  Allergy: no hives  Musculoskeletal: no leg swelling  Neurological: no slurred speech ROS otherwise negative  PAST MEDICAL HISTORY/PAST SURGICAL HISTORY:  Past Medical History:  Diagnosis Date  . AICD (automatic cardioverter/defibrillator) present 10/18/2016  . Anemia   . Arthritis    hip, hands- Osteoarthritis    . CAD (coronary artery disease)   . CKD (chronic kidney disease) stage 3, GFR 30-59 ml/min (HCC)    [name]  . Diverticulosis   . Dyslipidemia   . GERD (gastroesophageal reflux disease)   . Hypertension    g. taylor  . ICD (implantable cardiac defibrillator) in place    2009-St. Jude device  . Neuromuscular disorder (HCC)    neuropathy- in hands  . Pacemaker   . Shortness of breath   . Sleep apnea    started a sleep study but didn't completed, states PCP told him he has sleep  apnea, doesn't use CPAP  . Systolic dysfunction   . Type 2 diabetes  with nephropathy ([place])   . Vitamin D deficiency     MEDICATIONS:  Prior to Admission medications   Medication Sig Start Date End Date Taking? Authorizing Provider  ACCU-CHEK SOFTCLIX LANCETS lancets Use as instructed to check blood sugar once daily and as needed.  Diagnosis:  E11.21  Non- insulin dependent. 07/24/16   Tonia Ghent, MD  albuterol (PROVENTIL HFA;VENTOLIN HFA) 108 (90 Base) MCG/ACT inhaler Inhale 2 puffs into the lungs every 6 (six) hours as needed for wheezing or shortness of breath. 02/11/17   Lucille Passy, MD  Alcohol Swabs (B-D SINGLE USE SWABS REGULAR) PADS Use as instructed to clean area for glucose monitoring once daily and as needed.  Diagnosis:  E11.21  Non insulin-dependent 07/24/16   Tonia Ghent, MD  aspirin EC 81 MG tablet Take 81 mg by mouth daily.    [provider]  Blood Glucose Calibration (ACCU-CHEK AVIVA) SOLN Use to calibrate blood glucose machine as recommended.  Diagnosis:  E11.21  Non-insulin dependent. 07/24/16   Tonia Ghent, MD  Blood Glucose Monitoring Suppl (ACCU-CHEK AVIVA) device Use to check blood sugar once daily.  Dx: E11.21 09/25/16 09/25/17  Lucille Passy, MD  carvedilol (COREG) 12.5 MG tablet TAKE 1 TABLET TWICE DAILY WITH A MEAL 06/03/17   Allred, Jeneen Rinks, MD  furosemide (LASIX) 20 MG tablet Take 1 tablet (20 mg total) by mouth daily.  04/24/17   Lucille Passy, MD  gabapentin (NEURONTIN) 300 MG capsule Take 2 capsules (600 mg total) by mouth 2 (two) times daily. 03/21/17   Lucille Passy, MD  glipiZIDE (GLUCOTROL) 5 MG tablet TAKE 1 TABLET EVERY DAY BEFORE BREAKFAST 06/25/17   Lucille Passy, MD  glucose blood (ACCU-CHEK AVIVA PLUS) test strip Test Blood Sugar 1 time daily: Dx:E11.27 07/31/17   Lucille Passy, MD  lisinopril (PRINIVIL,ZESTRIL) 2.5 MG tablet TAKE 1 TABLET EVERY DAY 06/25/17   Lucille Passy, MD  lovastatin (MEVACOR) 20 MG tablet TAKE 1 TABLET AT BEDTIME 06/25/17   Lucille Passy, MD  meloxicam (MOBIC) 15 MG tablet Take 1  tablet (15 mg total) by mouth daily. 04/24/17   Lucille Passy, MD  tamsulosin (FLOMAX) 0.4 MG CAPS capsule TAKE 1 CAPSULE EVERY DAY 06/25/17   Lucille Passy, MD    ALLERGIES:  No Known Allergies  SOCIAL HISTORY:  Social History   Tobacco Use  . Smoking status: Former Smoker    Types: Cigarettes    Last attempt to quit: 07/30/1984    Years since quitting: 33.0  . Smokeless tobacco: Never Used  Substance Use Topics  . Alcohol use: Yes    Comment: occ    FAMILY HISTORY: Family History  Problem Relation Age of Onset  . Heart attack Father 45  . Diabetes Brother     EXAM: BP (!) 167/102   Pulse 96   Temp 99.6 F (37.6 C) (Rectal)   Resp (!) 26   Ht 5\' 7"  (1.702 m)   Wt 88 kg (194 lb)   SpO2 95%   BMI 30.38 kg/m  CONSTITUTIONAL: Alert and oriented and responds appropriately to questions but patient is in severe respiratory distress  HEAD: Normocephalic EYES: Conjunctivae clear, pupils appear equal, EOMI ENT: normal nose; moist mucous membranes NECK: Supple, no meningismus, no nuchal rigidity, no LAD  CARD: RRR; S1 and S2 appreciated; no murmurs, no clicks, no rubs, no gallops RESP: Patient in severe respiratory distress.  Hypoxic in the upper 70s on room air.  Diminished aeration at bases with scattered expiratory wheezes.  No rhonchi appreciated.  Speaking only 1-2 words at a time.  Tachypneic. ABD/GI: Normal bowel sounds; non-distended; soft, non-tender, no rebound, no guarding, no peritoneal signs, no hepatosplenomegaly BACK:  The back appears normal and is non-tender to palpation, there is no CVA tenderness EXT: Normal ROM in all joints; non-tender to palpation; no edema; normal capillary refill; no cyanosis, no calf tenderness or swelling    SKIN: Normal color for age and race; warm; no rash NEURO: Moves all extremities equally PSYCH: The patient's mood and manner are appropriate. Grooming and personal hygiene are appropriate.  MEDICAL DECISION MAKING: Patient here in  respiratory distress.  He is hypoxic.  Does have history of heart failure and was also a previous smoker but no diagnosis of asthma or COPD.  I do not appreciate JVD or edema but given sudden onset I am concerned for pulmonary edema.  Chest x-ray appears to be volume overloaded.  He is afebrile no sign of pneumonia.  Denies any chest pain or chest discomfort.  ABG shows a metabolic acidosis.  He is on BiPAP for comfort.  Improving slowly.  He will need admission.  Will give IV Lasix.  ED PROGRESS: Patient has an elevated troponin.  I feel this is in the setting of CHF exacerbation.  Will discuss with cardiology.  He denies chest pain or chest  discomfort but wife now reports that he was complaining of chest soreness yesterday.  Does report that his ICD fired twice on Sunday.  We will interrogate his pacemaker.   2:40 AM  D/w Dr. Teena Dunk, cardiology fellow on call.  Appreciate his help.  He states looking through the cardiology notes patient has had recurrent ventricular tachycardia.  Recommend starting IV amiodarone 60 mg/h, IV heparin given elevated troponin.  Agrees with IV diuresis.  Recommends loading with magnesium 2 g IV in the emergency department.  Will check magnesium level.  Potassium is 5.1.  Cardiology will follow in consult and has asked for medicine admission.  Patient improving on BiPAP.   3:04 AM Discussed patient's case with hospitalist, Dr. Tamala Julian.  I have recommended admission and patient (and family if present) agree with this plan. Admitting physician will place admission orders.   I reviewed all nursing notes, vitals, pertinent previous records, EKGs, lab and urine results, imaging (as available).   Patient's troponin is 10.29.  Hospitalist aware.  Magnesium is 2.0.  Patient still denies having any recent chest pain or chest discomfort.  Feels like his shortness of breath is improving on BiPAP and now can speak short sentences.  He is no longer hypoxic.     EKG  Interpretation  Date/Time:  Tuesday August 27 2017 01:53:32 EST Ventricular Rate:  97 PR Interval:    QRS Duration: 122 QT Interval:  362 QTC Calculation: 460 R Axis:   80 Text Interpretation:  Sinus rhythm Nonspecific intraventricular conduction delay Anteroseptal infarct, old Repol abnrm, severe global ischemia (LM/MVD) Confirmed by Pryor Curia [phone]) on 08/27/2017 2:42:36 AM        CRITICAL CARE Performed by: Cyril Mourning Lysha Schrade   Total critical care time: 65 minutes  Critical care time was exclusive of separately billable procedures and treating other patients.  Critical care was necessary to treat or prevent imminent or life-threatening deterioration.  Critical care was time spent personally by me on the following activities: development of treatment plan with patient and/or surrogate as well as nursing, discussions with consultants, evaluation of patient's response to treatment, examination of patient, obtaining history from patient or surrogate, ordering and performing treatments and interventions, ordering and review of laboratory studies, ordering and review of radiographic studies, pulse oximetry and re-evaluation of patient's condition.      Sylena Lotter, Delice Bison, DO 08/27/17 [phone]

## 2017-08-27 NOTE — ED Notes (Signed)
Dr. Leonides Schanz and RT at the bedside, pt to be place on Bipap per Dr. Guido Sander order.

## 2017-08-27 NOTE — Progress Notes (Signed)
  Echocardiogram 2D Echocardiogram with definity has been performed.  Darlina Sicilian M 08/27/2017, 1:43 PM

## 2017-08-27 NOTE — ED Notes (Signed)
Breakfast ordered 

## 2017-08-28 LAB — COMPREHENSIVE METABOLIC PANEL
ALBUMIN: 3.8 g/dL (ref 3.5–5.0)
ALK PHOS: 33 U/L — AB (ref 38–126)
ALT: 15 U/L — ABNORMAL LOW (ref 17–63)
ANION GAP: 12 (ref 5–15)
AST: 39 U/L (ref 15–41)
BILIRUBIN TOTAL: 0.6 mg/dL (ref 0.3–1.2)
BUN: 37 mg/dL — AB (ref 6–20)
CALCIUM: 8.8 mg/dL — AB (ref 8.9–10.3)
CO2: 23 mmol/L (ref 22–32)
Chloride: 100 mmol/L — ABNORMAL LOW (ref 101–111)
Creatinine, Ser: 2.05 mg/dL — ABNORMAL HIGH (ref 0.61–1.24)
GFR calc Af Amer: 36 mL/min — ABNORMAL LOW (ref >60)
GFR, EST NON AFRICAN AMERICAN: 31 mL/min — AB (ref >60)
GLUCOSE: 123 mg/dL — AB (ref 65–99)
Potassium: 5 mmol/L (ref 3.5–5.1)
Sodium: 135 mmol/L (ref 135–145)
TOTAL PROTEIN: 6.2 g/dL — AB (ref 6.5–8.1)

## 2017-08-28 LAB — GLUCOSE, CAPILLARY
GLUCOSE-CAPILLARY: 131 mg/dL — AB (ref 65–99)
GLUCOSE-CAPILLARY: 102 mg/dL — AB (ref 65–99)
Glucose-Capillary: 141 mg/dL — ABNORMAL HIGH (ref 65–99)
Glucose-Capillary: 134 mg/dL — ABNORMAL HIGH (ref 65–99)

## 2017-08-28 LAB — CBC
HEMATOCRIT: 33 % — AB (ref 39.0–52.0)
HEMOGLOBIN: 10.7 g/dL — AB (ref 13.0–17.0)
MCH: 28.5 pg (ref 26.0–34.0)
MCHC: 32.4 g/dL (ref 30.0–36.0)
MCV: 88 fL (ref 78.0–100.0)
Platelets: 139 10*3/uL — ABNORMAL LOW (ref 150–400)
RBC: 3.75 MIL/uL — ABNORMAL LOW (ref 4.22–5.81)
RDW: 13.5 % (ref 11.5–15.5)
WBC: 8.2 10*3/uL (ref 4.0–10.5)

## 2017-08-28 LAB — HEPARIN LEVEL (UNFRACTIONATED)
HEPARIN UNFRACTIONATED: 0.21 [IU]/mL — AB (ref 0.30–0.70)
Heparin Unfractionated: 0.53 IU/mL (ref 0.30–0.70)

## 2017-08-28 MED ORDER — IPRATROPIUM-ALBUTEROL 0.5-2.5 (3) MG/3ML IN SOLN
3.0000 mL | Freq: Three times a day (TID) | RESPIRATORY_TRACT | Status: DC
  Administered 2017-08-28 – 2017-09-03 (×16): 3 mL via RESPIRATORY_TRACT
  Filled 2017-08-28: qty 3
  Filled 2017-08-28: qty 42
  Filled 2017-08-28 (×15): qty 3

## 2017-08-28 MED ORDER — AMIODARONE HCL 200 MG PO TABS
200.0000 mg | ORAL_TABLET | Freq: Two times a day (BID) | ORAL | Status: DC
  Administered 2017-08-28 – 2017-08-30 (×5): 200 mg via ORAL
  Filled 2017-08-28 (×5): qty 1

## 2017-08-28 NOTE — Progress Notes (Signed)
Progress Note  Patient Name: Randy [name] Date of Encounter: 08/28/2017  Primary Cardiologist: Dr Lovena Le    Subjective   No chest pain, no palpitations or presyncope, SOB is much improved.  Inpatient Medications    Scheduled Meds: . aspirin EC  81 mg Oral Daily  . atorvastatin  80 mg Oral q1800  . carvedilol  12.5 mg Oral BID WC  . gabapentin  600 mg Oral BID  . insulin aspart  0-9 Units Subcutaneous TID WC  . ipratropium-albuterol  3 mL Nebulization QID  . sodium chloride flush  3 mL Intravenous Q12H   Continuous Infusions: . sodium chloride    . amiodarone 30 mg/hr (08/28/17 0606)  . heparin 1,350 Units/hr (08/28/17 0340)   PRN Meds: sodium chloride, acetaminophen, amiodarone, ipratropium-albuterol, ondansetron (ZOFRAN) IV, sodium chloride flush   Vital Signs    Vitals:   08/28/17 0454 08/28/17 0549 08/28/17 0725 08/28/17 0805  BP: 94/62  109/71   Pulse: (!) 53  (!) 58   Resp: 10  (!) 22   Temp: 97.7 F (36.5 C)  97.7 F (36.5 C)   TempSrc: Oral  Oral   SpO2: 97%  97% 91%  Weight:  191 lb 6.4 oz (86.8 kg)    Height:        Intake/Output Summary (Last 24 hours) at 08/28/2017 0917 Last data filed at 08/28/2017 0900 Gross per 24 hour  Intake 1219.23 ml  Output 1245 ml  Net -25.77 ml   Filed Weights   08/27/17 0145 08/27/17 1503 08/28/17 0549  Weight: 194 lb (88 kg) 194 lb (88 kg) 191 lb 6.4 oz (86.8 kg)    Telemetry    SR, Sinus brady, A pacing w/ HR 49 most of the night - Personally Reviewed  ECG    01/29 SR, QRS duration 122 ms (previously 86 ms), PVCs seen  - Personally Reviewed  Physical Exam   General: Well developed, well nourished, male appearing in no acute distress. Head: Normocephalic, atraumatic.  Neck: Supple without bruits, JVD 9 cm. Lungs:  Resp regular and unlabored, decreased BS bases w/ some rales. Heart: RRR, S1, S2, no S3, S4, or murmur; no rub. Abdomen: Soft, non-tender, non-distended with normoactive bowel sounds. No  hepatomegaly. No rebound/guarding. No obvious abdominal masses. Extremities: No clubbing, cyanosis, no edema. Distal pedal pulses are 2+ bilaterally. Neuro: Alert and oriented X 3. Moves all extremities spontaneously. Psych: Normal affect.  Labs    Hematology Recent Labs  Lab 08/27/17 0144 08/28/17 0247  WBC 10.9* 8.2  RBC 4.39 3.75*  HGB 12.6* 10.7*  HCT 39.4 33.0*  MCV 89.7 88.0  MCH 28.7 28.5  MCHC 32.0 32.4  RDW 13.7 13.5  PLT 183 139*    Chemistry Recent Labs  Lab 08/27/17 0144 08/28/17 0247  NA 137 135  K 5.1 5.0  CL 104 100*  CO2 22 23  GLUCOSE 172* 123*  BUN 30* 37*  CREATININE 1.70* 2.05*  CALCIUM 9.1 8.8*  PROT  --  6.2*  ALBUMIN  --  3.8  AST  --  39  ALT  --  15*  ALKPHOS  --  33*  BILITOT  --  0.6  GFRNONAA 39* 31*  GFRAA 45* 36*  ANIONGAP 11 12     Cardiac Enzymes Recent Labs  Lab 08/27/17 0215 08/27/17 0357 08/27/17 1020 08/27/17 1518  TROPONINI 10.29* 7.84* 11.38* 11.77*    Recent Labs  Lab 08/27/17 0155  TROPIPOC 5.58*     BNP  Recent Labs  Lab 08/27/17 0215  BNP 883.5*     Radiology    Dg Chest Portable 1 View  Result Date: 08/27/2017 CLINICAL DATA:  Shortness of breath EXAM: PORTABLE CHEST 1 VIEW COMPARISON:  03/19/2017 FINDINGS: Cardiac pacemaker. Postoperative changes in the cervical spine. Heart size and pulmonary vascularity are normal. Diffuse interstitial pattern to the lungs has developed since previous study, likely indicating interstitial infiltration due to edema or pneumonia. No focal consolidation. No blunting of costophrenic angles. No pneumothorax. Mediastinal contours appear intact. Calcification of the aorta. IMPRESSION: New interstitial pattern to the lungs may represent interstitial pneumonitis or edema. Heart size is normal. No focal consolidation. Electronically Signed   By: Lucienne Capers M.D.   On: 08/27/2017 02:15     Cardiac Studies   ECHO: 08/27/2017 - Left ventricle: The cavity size was  normal. Systolic function was   severely reduced. The estimated ejection fraction was in the   range of 25% to 30%. Diffuse hypokinesis. Features are consistent   with a pseudonormal left ventricular filling pattern, with   concomitant abnormal relaxation and increased filling pressure   (grade 2 diastolic dysfunction). Doppler parameters are   consistent with high ventricular filling pressure. - Aortic valve: Transvalvular velocity was within the normal range.   There was no stenosis. There was no regurgitation. - Mitral valve: Transvalvular velocity was within the normal range.   There was no evidence for stenosis. There was trivial   regurgitation. - Left atrium: The atrium was mildly dilated. - Right ventricle: The cavity size was normal. Wall thickness was   normal. Systolic function was normal. - Tricuspid valve: There was trivial regurgitation. - Pulmonary arteries: Systolic pressure was within the normal   range. PA peak pressure: 18 mm Hg (S).   Patient Profile     72 y.o. male w/ hx  2v CAD w/ med rx, chronic systolic CHF with AICD placement in 09/2016, HTN, HLD, T2DM, and CKD stage III and  who was admitted 01/30 w/ CHF exacerbation, pt reported ICD shocks x 2.  Assessment & Plan    Principal Problem: 1.  Acute on chronic combined systolic and diastolic of CHF (congestive heart failure) (HCC) - O2 sats have improved  - sx improved and PAS 18 on echo, will not order any more Lasix right now. - wt at 191 lbs is below recent office weights.   2. VT: - He had 3 episodes PTA, ATP x 1, ICD shock x 2 - now on IV amio - no VT overnight, discuss changing amio to po - HR is low enough now to be A pacing  3. CM - EF is lower than previous, was 35-40% 09/2016 echo - SBP 90s at times overnight so will not add nitrates, Cr too high for ACE/ARB - no WMA on echo, diffuse hypokinesis  4. NSTEMI - Trop was 5>10>7>11>11 - no chest pain - seems too high for CHF and/or ICD d/c -  however, Cr too high right now for cath - continue heparin, ASA, BB, high-dose statin  Otherwise, per IM  Active Problems:   Type 2 diabetes with nephropathy ([place])   Essential hypertension   Automatic implantable cardioverter-defibrillator in situ - [employer] Jude   Acute respiratory failure with hypoxia (HCC)   Leukocytosis  Signed, Rosaria Ferries , PA-C 9:17 AM 08/28/2017 Pager: [phone] As above, patient seen and examined.  Patient denies dyspnea or chest pain this morning.  Patient presented with congestive heart failure and ICD discharges.  Interrogation of his device showed ventricular fibrillation.  I will change IV amiodarone to 200 mg by mouth twice daily for 2 weeks then 200 mg daily thereafter.  He has ruled in for an infarct.  Continue aspirin, heparin, beta-blocker and statin.  ACE inhibitor on hold prior to catheterization given baseline renal insufficiency.  His creatinine has increased with diuresis.  We will hold Lasix and follow clinically.  Check renal function tomorrow morning and if improving will proceed with cardiac catheterization.  Question if he has developed a lesion in his LAD explaining presentation. Kirk Ruths, MD

## 2017-08-28 NOTE — Progress Notes (Signed)
ANTICOAGULATION CONSULT NOTE - Follow Up Consult  Pharmacy Consult for Heparin  Indication: chest pain/ACS  No Known Allergies  Patient Measurements: Height: 5\' 7"  (170.2 cm) Weight: 191 lb 6.4 oz (86.8 kg) IBW/kg (Calculated) : 66.1  Vital Signs: Temp: 97.7 F (36.5 C) (01/30 0725) Temp Source: Oral (01/30 0725) BP: 110/62 (01/30 1152) Pulse Rate: 61 (01/30 1152)  Labs: Recent Labs    08/27/17 0144  08/27/17 0357 08/27/17 1020 08/27/17 1518  08/27/17 2335 08/28/17 0247 08/28/17 1137  HGB 12.6*  --   --   --   --   --   --  10.7*  --   HCT 39.4  --   --   --   --   --   --  33.0*  --   PLT 183  --   --   --   --   --   --  139*  --   HEPARINUNFRC  --   --   --  0.34  --    < > 0.19* 0.21* 0.53  CREATININE 1.70*  --   --   --   --   --   --  2.05*  --   TROPONINI  --    < > 7.84* 11.38* 11.77*  --   --   --   --    < > = values in this interval not displayed.    Estimated Creatinine Clearance: 34.8 mL/min (A) (by C-G formula based on SCr of 2.05 mg/dL (H)).   Assessment: Heparin for r/o acs, unable to take to cath d/t AKI. Heparin now at goal on 1350 units/hr. Will recheck level in am. No bleeding issues noted. Hgb down overnight from 12.6>10.7.  Goal of Therapy:  Heparin level 0.3-0.7 units/ml Monitor platelets by anticoagulation protocol: Yes   Plan:  Cont heparin at current rate Re-check heparin level with AM labs  Erin Hearing PharmD., BCPS Clinical Pharmacist 08/28/2017 12:59 PM

## 2017-08-28 NOTE — Progress Notes (Signed)
PROGRESS NOTE   Randy [name]  WVP:[id number]    DOB: [birth year]    DOA: 08/27/2017  PCP: Lucille Passy, MD   I have briefly reviewed patients previous medical records in [facility].  Brief Narrative:  73 year old male with PMH of CAD, chronic systolic CHF (LVEF 54-62%) with AICD placement 09/2016, HTN, HLD, DM 2, stage III chronic kidney disease, dyspnea, presented to the ED 08/27/17 with acute onset of dyspnea, orthopnea and PND.  In ED, respiratory distress, hypoxic at 86%.  Placed on BiPAP.  BNP 83.5.  Chest x-ray suggestive of pulmonary edema.  AICD fired twice while playing golf 2 days PTA.  Admitted for acute on chronic combined CHF, VT with AICD firing as outpatient and NSTEMI.  Cardiology consulting.   Assessment & Plan:   Principal Problem:   Acute exacerbation of CHF (congestive heart failure) (HCC) Active Problems:   Type 2 diabetes with nephropathy (HCC)   Essential hypertension   Automatic implantable cardioverter-defibrillator in situ - [employer]   Acute respiratory failure with hypoxia (HCC)   Leukocytosis   1. Acute on chronic combined systolic and diastolic CHF/ischemic cardiomyopathy: TTE 1/29: LVEF 25-30%, diffuse hypokinesis and grade 2 diastolic dysfunction.  Cardiology consulted.  Diuresed with IV Lasix.  Negative 257 mL.  Weight apparently below his outpatient baseline.  Creatinine increased.  Lasix discontinued in preparation for cardiac cath.  Improved.  No ACEI/ARB due to worsening creatinine and holding nitrates due to SBP in the 90s. 2. Ventricular tachycardia: Had 3 episodes prior to admission and AICD shocked.  Cardiology managing.  Now on amiodarone.  No further VT noted on monitor. 3. NSTEMI: No chest pain.  Troponin peaked to 11.77.  Continue IV heparin, aspirin, beta blockers and high-dose statins.  Cardiology follow-up appreciated.  Waiting for creatinine to improve prior to cardiac catheterization. 4. Acute respiratory failure with hypoxia: Oxygen  saturations noted to be 77% on room air in ED.  Briefly on BiPAP.  Treated for decompensated CHF.  Hypoxia resolved. 5. Essential hypertension: Controlled.  Continue carvedilol. 6. Acute on stage III chronic kidney disease: Baseline creatinine 1.7-1.9.  Creatinine went up from 1.7-2.05, likely due to diuresis.  Follow BMP in a.m. 7. Type II DM with renal complications: V0J on 5/00/93: 7.4.  Hold oral hypoglycemics.  SSI.  Reasonable inpatient control. 8. Normocytic anemia: Hemoglobin dropped from 12.6-10.7 in the absence of acute bleeding.  Follow CBC in a.m. 9. Thrombocytopenia:?  Related to heparin.  Follow CBC in a.m.   DVT prophylaxis: Currently on IV heparin infusion. Code Status: Full Family Communication: None at bedside Disposition: To be determined   Consultants:  Cardiology  Procedures:  None  Antimicrobials:  None   Subjective: Patient denies chest pain.  Dyspnea improved and breathing almost back to baseline.  No palpitations, dizziness or lightheadedness reported.  ROS: As above.  Objective:  Vitals:   08/28/17 1000 08/28/17 1152 08/28/17 1157 08/28/17 1200  BP: 108/70 110/62  105/61  Pulse: 61 61    Resp: (!) 30 17  17   Temp:    98.1 F (36.7 C)  TempSrc:    Oral  SpO2: 96%  97% 97%  Weight:      Height:        Examination:  General exam: Elderly male, moderately built and nourished, sitting up comfortably in bed this morning. Respiratory system: Occasional basal crackles but otherwise clear to auscultation. Respiratory effort normal. Cardiovascular system: S1 & S2 heard, RRR. No JVD, murmurs,  rubs, gallops or clicks. No pedal edema.  Telemetry: SR-S/P in the 50s.  Occasional A paced rhythm. Gastrointestinal system: Abdomen is nondistended, soft and nontender. No organomegaly or masses felt. Normal bowel sounds heard. Central nervous system: Alert and oriented. No focal neurological deficits. Extremities: Symmetric 5 x 5 power. Skin: No rashes,  lesions or ulcers Psychiatry: Judgement and insight appear normal. Mood & affect appropriate.     Data Reviewed: I have personally reviewed following labs and imaging studies  CBC: Recent Labs  Lab 08/27/17 0144 08/28/17 0247  WBC 10.9* 8.2  HGB 12.6* 10.7*  HCT 39.4 33.0*  MCV 89.7 88.0  PLT 183 017*   Basic Metabolic Panel: Recent Labs  Lab 08/27/17 0144 08/27/17 0235 08/28/17 0247  NA 137  --  135  K 5.1  --  5.0  CL 104  --  100*  CO2 22  --  23  GLUCOSE 172*  --  123*  BUN 30*  --  37*  CREATININE 1.70*  --  2.05*  CALCIUM 9.1  --  8.8*  MG  --  2.0  --    Liver Function Tests: Recent Labs  Lab 08/28/17 0247  AST 39  ALT 15*  ALKPHOS 33*  BILITOT 0.6  PROT 6.2*  ALBUMIN 3.8   Coagulation Profile: No results for input(s): INR, PROTIME in the last 168 hours. Cardiac Enzymes: Recent Labs  Lab 08/27/17 0215 08/27/17 0357 08/27/17 1020 08/27/17 1518  TROPONINI 10.29* 7.84* 11.38* 11.77*   HbA1C: No results for input(s): HGBA1C in the last 72 hours. CBG: Recent Labs  Lab 08/27/17 1134 08/27/17 1708 08/27/17 2107 08/28/17 0553 08/28/17 1153  GLUCAP 113* 139* 119* 131* 141*    No results found for this or any previous visit (from the past 240 hour(s)).       Radiology Studies: Dg Chest Portable 1 View  Result Date: 08/27/2017 CLINICAL DATA:  Shortness of breath EXAM: PORTABLE CHEST 1 VIEW COMPARISON:  03/19/2017 FINDINGS: Cardiac pacemaker. Postoperative changes in the cervical spine. Heart size and pulmonary vascularity are normal. Diffuse interstitial pattern to the lungs has developed since previous study, likely indicating interstitial infiltration due to edema or pneumonia. No focal consolidation. No blunting of costophrenic angles. No pneumothorax. Mediastinal contours appear intact. Calcification of the aorta. IMPRESSION: New interstitial pattern to the lungs may represent interstitial pneumonitis or edema. Heart size is normal. No  focal consolidation. Electronically Signed   By: Lucienne Capers M.D.   On: 08/27/2017 02:15        Scheduled Meds: . amiodarone  200 mg Oral BID  . aspirin EC  81 mg Oral Daily  . atorvastatin  80 mg Oral q1800  . carvedilol  12.5 mg Oral BID WC  . gabapentin  600 mg Oral BID  . insulin aspart  0-9 Units Subcutaneous TID WC  . ipratropium-albuterol  3 mL Nebulization QID  . sodium chloride flush  3 mL Intravenous Q12H   Continuous Infusions: . sodium chloride    . heparin 1,350 Units/hr (08/28/17 0340)     LOS: 1 day     Vernell Leep, MD, FACP, [facility]. [facility] Pager [phone]  If 7PM-7AM, please contact night-coverage [email or web address] Password [email or web address] 08/28/2017, 3:45 PM

## 2017-08-28 NOTE — Progress Notes (Addendum)
ANTICOAGULATION CONSULT NOTE - Follow Up Consult  Pharmacy Consult for Heparin  Indication: chest pain/ACS  No Known Allergies  Patient Measurements: Height: 5\' 7"  (170.2 cm) Weight: 194 lb (88 kg) IBW/kg (Calculated) : 66.1  Vital Signs: Temp: 97.6 F (36.4 C) (01/29 2350) Temp Source: Oral (01/29 2350) BP: 99/64 (01/29 2350) Pulse Rate: 52 (01/29 2350)  Labs: Recent Labs    08/27/17 0144  08/27/17 0357 08/27/17 1020 08/27/17 1518 08/27/17 2053 08/27/17 2335  HGB 12.6*  --   --   --   --   --   --   HCT 39.4  --   --   --   --   --   --   PLT 183  --   --   --   --   --   --   HEPARINUNFRC  --   --   --  0.34  --  <0.10* 0.19*  CREATININE 1.70*  --   --   --   --   --   --   TROPONINI  --    < > 7.84* 11.38* 11.77*  --   --    < > = values in this interval not displayed.    Estimated Creatinine Clearance: 42.2 mL/min (A) (by C-G formula based on SCr of 1.7 mg/dL (H)).   Assessment: Heparin level of 0.19 drawn only 2.5 hours after re-starting heparin drip due to site issues  Goal of Therapy:  Heparin level 0.3-0.7 units/ml Monitor platelets by anticoagulation protocol: Yes   Plan:  Cont heparin at current rate Re-check heparin level with AM labs  Narda Bonds 08/28/2017,12:13 AM   ============== Heparin level remains low at 0.21, no issues per RN -Inc heparin to 1350 units/hr -[place], PharmD, BCPS Clinical Pharmacist Phone: [phone] ===============

## 2017-08-29 ENCOUNTER — Other Ambulatory Visit: Payer: Self-pay | Admitting: Internal Medicine

## 2017-08-29 DIAGNOSIS — D696 Thrombocytopenia, unspecified: Secondary | ICD-10-CM

## 2017-08-29 DIAGNOSIS — N189 Chronic kidney disease, unspecified: Secondary | ICD-10-CM

## 2017-08-29 DIAGNOSIS — N179 Acute kidney failure, unspecified: Secondary | ICD-10-CM

## 2017-08-29 LAB — BASIC METABOLIC PANEL
ANION GAP: 11 (ref 5–15)
BUN: 39 mg/dL — ABNORMAL HIGH (ref 6–20)
CO2: 20 mmol/L — ABNORMAL LOW (ref 22–32)
Calcium: 8.9 mg/dL (ref 8.9–10.3)
Chloride: 105 mmol/L (ref 101–111)
Creatinine, Ser: 1.91 mg/dL — ABNORMAL HIGH (ref 0.61–1.24)
GFR calc Af Amer: 39 mL/min — ABNORMAL LOW (ref >60)
GFR, EST NON AFRICAN AMERICAN: 34 mL/min — AB (ref >60)
GLUCOSE: 107 mg/dL — AB (ref 65–99)
POTASSIUM: 4.8 mmol/L (ref 3.5–5.1)
Sodium: 136 mmol/L (ref 135–145)

## 2017-08-29 LAB — GLUCOSE, CAPILLARY
GLUCOSE-CAPILLARY: 123 mg/dL — AB (ref 65–99)
Glucose-Capillary: 103 mg/dL — ABNORMAL HIGH (ref 65–99)
Glucose-Capillary: 105 mg/dL — ABNORMAL HIGH (ref 65–99)
Glucose-Capillary: 108 mg/dL — ABNORMAL HIGH (ref 65–99)

## 2017-08-29 LAB — CBC
HEMATOCRIT: 33.5 % — AB (ref 39.0–52.0)
Hemoglobin: 10.9 g/dL — ABNORMAL LOW (ref 13.0–17.0)
MCH: 28.5 pg (ref 26.0–34.0)
MCHC: 32.5 g/dL (ref 30.0–36.0)
MCV: 87.7 fL (ref 78.0–100.0)
PLATELETS: 121 10*3/uL — AB (ref 150–400)
RBC: 3.82 MIL/uL — AB (ref 4.22–5.81)
RDW: 13.5 % (ref 11.5–15.5)
WBC: 6.4 10*3/uL (ref 4.0–10.5)

## 2017-08-29 LAB — HEPARIN LEVEL (UNFRACTIONATED): HEPARIN UNFRACTIONATED: 0.54 [IU]/mL (ref 0.30–0.70)

## 2017-08-29 MED ORDER — ASPIRIN 81 MG PO CHEW
81.0000 mg | CHEWABLE_TABLET | ORAL | Status: AC
  Administered 2017-08-30: 81 mg via ORAL
  Filled 2017-08-29: qty 1

## 2017-08-29 MED ORDER — SODIUM CHLORIDE 0.9 % IV SOLN: 250.0000 mL | INTRAVENOUS | Status: DC | PRN

## 2017-08-29 MED ORDER — SODIUM CHLORIDE 0.9% FLUSH: 3.0000 mL | Freq: Two times a day (BID) | INTRAVENOUS | Status: DC

## 2017-08-29 MED ORDER — SODIUM CHLORIDE 0.9% FLUSH: 3.0000 mL | INTRAVENOUS | Status: DC | PRN

## 2017-08-29 MED ORDER — POLYETHYLENE GLYCOL 3350 17 G PO PACK
17.0000 g | PACK | Freq: Every day | ORAL | Status: DC
  Administered 2017-08-29 – 2017-08-31 (×2): 17 g via ORAL
  Filled 2017-08-29 (×4): qty 1

## 2017-08-29 MED ORDER — SENNA 8.6 MG PO TABS
2.0000 | ORAL_TABLET | Freq: Every day | ORAL | Status: DC
  Administered 2017-08-29 – 2017-08-31 (×2): 17.2 mg via ORAL
  Filled 2017-08-29 (×4): qty 2

## 2017-08-29 MED ORDER — SODIUM CHLORIDE 0.9 % IV SOLN
INTRAVENOUS | Status: DC
  Administered 2017-08-30: 06:00:00 via INTRAVENOUS

## 2017-08-29 NOTE — Progress Notes (Signed)
PROGRESS NOTE   Randy [name]  OYD:[id number]    DOB: [birth year]    DOA: 08/27/2017  PCP: Lucille Passy, MD   I have briefly reviewed patients previous medical records in [facility].  Brief Narrative:  72 year old male with PMH of CAD, chronic systolic CHF (LVEF 67-20%) with AICD placement 09/2016, HTN, HLD, DM 2, stage III chronic kidney disease, dyspnea, presented to the ED 08/27/17 with acute onset of dyspnea, orthopnea and PND.  In ED, respiratory distress, hypoxic at 86%.  Placed on BiPAP.  BNP 83.5.  Chest x-ray suggestive of pulmonary edema.  AICD fired twice while playing golf 2 days PTA.  Admitted for acute on chronic combined CHF, VT with AICD firing as outpatient and NSTEMI.  Cardiology consulting.   Assessment & Plan:   Principal Problem:   Acute exacerbation of CHF (congestive heart failure) (HCC) Active Problems:   Type 2 diabetes with nephropathy (HCC)   Essential hypertension   Automatic implantable cardioverter-defibrillator in situ - [employer]   Acute respiratory failure with hypoxia (HCC)   Leukocytosis   1. Acute on chronic combined systolic and diastolic CHF/ischemic cardiomyopathy: TTE 1/29: LVEF 25-30%, diffuse hypokinesis and grade 2 diastolic dysfunction.  Cardiology consulted.  Diuresed with IV Lasix.  -1.3 L.  Weight apparently below his outpatient baseline.  Creatinine increased and Lasix discontinued 1/30 in preparation for cardiac cath.  Improved.  No ACEI/ARB due to worsening creatinine and holding nitrates due to SBP in the 90s.  Stable. 2. Ventricular tachycardia: Had 3 episodes prior to admission and AICD shocked.  Cardiology managing.  Now on amiodarone.  No further VT noted on monitor. 3. NSTEMI: No chest pain.  Troponin peaked to 11.77.  Continue IV heparin, aspirin, beta blockers and high-dose statins.  Cardiology follow-up appreciated.  Creatinine down from 2.05-1.91.  Cardiology considering catheterization without ventriculogram 2/1 pending further  improvement in creatinine. 4. Acute respiratory failure with hypoxia: Oxygen saturations noted to be 77% on room air in ED.  Briefly on BiPAP.  Treated for decompensated CHF.  Hypoxia resolved. 5. Essential hypertension: Controlled.  Continue carvedilol. 6. Acute on stage III chronic kidney disease: Baseline creatinine 1.7-1.9.  Creatinine went up from 1.7-2.05, likely due to diuresis.  Creatinine improved as above. Follow BMP in a.m. 7. Type II DM with renal complications: N4B on 0/96/28: 7.4.  Hold oral hypoglycemics.  SSI.  Reasonable inpatient control. 8. Normocytic anemia: Hemoglobin dropped from 12.6-10.7 in the absence of acute bleeding.  Hemoglobin stable in the high 10 g range. 9. Thrombocytopenia:?  Related to heparin.  183 > 139 > 121.  Continue to follow closely.   DVT prophylaxis: Currently on IV heparin infusion. Code Status: Full Family Communication: None at bedside Disposition: To be determined   Consultants:  Cardiology  Procedures:  None  Antimicrobials:  None   Subjective: Unable to sleep well last night due to disturbance.  No dyspnea, chest pain, palpitations reported.?  Mild orthopnea and slept on 2 pillows.  ROS: As above.  Objective:  Vitals:   08/29/17 0907 08/29/17 1335 08/29/17 1411 08/29/17 1417  BP:  (!) 102/57 (!) 102/57   Pulse: 69 (!) 58 (!) 58   Resp:  18 20   Temp:  98.1 F (36.7 C)    TempSrc:  Oral    SpO2:  97% 96% 96%  Weight:      Height:        Examination:  General exam: Elderly male, moderately built and nourished,  sitting up comfortably in bed this morning. Respiratory system: Clear to auscultation. Respiratory effort normal. Cardiovascular system: S1 & S2 heard, RRR. No JVD, murmurs, rubs, gallops or clicks. No pedal edema.  Telemetry personally reviewed: SB in the 50s-SR in the 60s. Gastrointestinal system: Abdomen is nondistended, soft and nontender. No organomegaly or masses felt. Normal bowel sounds heard.  Stable  without change. Central nervous system: Alert and oriented. No focal neurological deficits.  Stable without change. Extremities: Symmetric 5 x 5 power. Skin: No rashes, lesions or ulcers Psychiatry: Judgement and insight appear normal. Mood & affect appropriate.     Data Reviewed: I have personally reviewed following labs and imaging studies  CBC: Recent Labs  Lab 08/27/17 0144 08/28/17 0247 08/29/17 0446  WBC 10.9* 8.2 6.4  HGB 12.6* 10.7* 10.9*  HCT 39.4 33.0* 33.5*  MCV 89.7 88.0 87.7  PLT 183 139* 099*   Basic Metabolic Panel: Recent Labs  Lab 08/27/17 0144 08/27/17 0235 08/28/17 0247 08/29/17 0446  NA 137  --  135 136  K 5.1  --  5.0 4.8  CL 104  --  100* 105  CO2 22  --  23 20*  GLUCOSE 172*  --  123* 107*  BUN 30*  --  37* 39*  CREATININE 1.70*  --  2.05* 1.91*  CALCIUM 9.1  --  8.8* 8.9  MG  --  2.0  --   --    Liver Function Tests: Recent Labs  Lab 08/28/17 0247  AST 39  ALT 15*  ALKPHOS 33*  BILITOT 0.6  PROT 6.2*  ALBUMIN 3.8   Coagulation Profile: No results for input(s): INR, PROTIME in the last 168 hours. Cardiac Enzymes: Recent Labs  Lab 08/27/17 0215 08/27/17 0357 08/27/17 1020 08/27/17 1518  TROPONINI 10.29* 7.84* 11.38* 11.77*   HbA1C: No results for input(s): HGBA1C in the last 72 hours. CBG: Recent Labs  Lab 08/28/17 1153 08/28/17 1743 08/28/17 2121 08/29/17 0636 08/29/17 1149  GLUCAP 141* 134* 102* 103* 105*    No results found for this or any previous visit (from the past 240 hour(s)).       Radiology Studies: No results found.      Scheduled Meds: . amiodarone  200 mg Oral BID  . aspirin EC  81 mg Oral Daily  . atorvastatin  80 mg Oral q1800  . carvedilol  12.5 mg Oral BID WC  . gabapentin  600 mg Oral BID  . insulin aspart  0-9 Units Subcutaneous TID WC  . ipratropium-albuterol  3 mL Nebulization TID  . sodium chloride flush  3 mL Intravenous Q12H   Continuous Infusions: . sodium chloride    .  heparin 1,350 Units/hr (08/28/17 1735)     LOS: 2 days     Vernell Leep, MD, FACP, [facility]. [facility] Pager [phone]  If 7PM-7AM, please contact night-coverage [email or web address] Password [email or web address] 08/29/2017, 2:21 PM

## 2017-08-29 NOTE — Research (Signed)
RESEARCH ENCOUNTER  Patient ID: Randy [name] DOB: [birth year]  Shawna Clamp meets inclusion/exclusion criteria for the Galactic-HF Study. Spoke with patient and wife at bedside. Provided pt with Galactic-HF Study information and informed consent. Answered questions and concerns.  Informed consent left at bedside for patient to review in private.  Will follow up with patient after cardiac catheterization tomorrow.

## 2017-08-29 NOTE — H&P (View-Only) (Signed)
Progress Note  Patient Name: Randy [name] Date of Encounter: 08/29/2017  Primary Cardiologist: Dr Lovena Le  Subjective   Denies dyspnea; no chest pain  Inpatient Medications    Scheduled Meds: . amiodarone  200 mg Oral BID  . aspirin EC  81 mg Oral Daily  . atorvastatin  80 mg Oral q1800  . carvedilol  12.5 mg Oral BID WC  . gabapentin  600 mg Oral BID  . insulin aspart  0-9 Units Subcutaneous TID WC  . ipratropium-albuterol  3 mL Nebulization TID  . sodium chloride flush  3 mL Intravenous Q12H   Continuous Infusions: . sodium chloride    . heparin 1,350 Units/hr (08/28/17 1735)   PRN Meds: sodium chloride, acetaminophen, ipratropium-albuterol, ondansetron (ZOFRAN) IV, sodium chloride flush   Vital Signs    Vitals:   08/28/17 2125 08/29/17 0340 08/29/17 0737 08/29/17 0812  BP: 108/65 115/62  108/77  Pulse: 73   (!) 57  Resp: 20   18  Temp: 98.2 F (36.8 C) 98.8 F (37.1 C)  97.7 F (36.5 C)  TempSrc: Oral Oral  Oral  SpO2: 95%  96% 97%  Weight:  192 lb 14.4 oz (87.5 kg)    Height:        Intake/Output Summary (Last 24 hours) at 08/29/2017 0905 Last data filed at 08/29/2017 0809 Gross per 24 hour  Intake 1122.15 ml  Output 1850 ml  Net -727.85 ml   Filed Weights   08/27/17 1503 08/28/17 0549 08/29/17 0340  Weight: 194 lb (88 kg) 191 lb 6.4 oz (86.8 kg) 192 lb 14.4 oz (87.5 kg)    Telemetry    Sinus with rare PVC- Personally Reviewed   Physical Exam   GEN: WD/WN No acute distress.   Neck: No JVD, supple Cardiac: RRR Respiratory: Mild exp wheeze GI: Soft, NT/ND MS: No edema Neuro:  Grossly intact   Labs    Chemistry Recent Labs  Lab 08/27/17 0144 08/28/17 0247 08/29/17 0446  NA 137 135 136  K 5.1 5.0 4.8  CL 104 100* 105  CO2 22 23 20*  GLUCOSE 172* 123* 107*  BUN 30* 37* 39*  CREATININE 1.70* 2.05* 1.91*  CALCIUM 9.1 8.8* 8.9  PROT  --  6.2*  --   ALBUMIN  --  3.8  --   AST  --  39  --   ALT  --  15*  --   ALKPHOS  --  33*   --   BILITOT  --  0.6  --   GFRNONAA 39* 31* 34*  GFRAA 45* 36* 39*  ANIONGAP 11 12 11      Hematology Recent Labs  Lab 08/27/17 0144 08/28/17 0247 08/29/17 0446  WBC 10.9* 8.2 6.4  RBC 4.39 3.75* 3.82*  HGB 12.6* 10.7* 10.9*  HCT 39.4 33.0* 33.5*  MCV 89.7 88.0 87.7  MCH 28.7 28.5 28.5  MCHC 32.0 32.4 32.5  RDW 13.7 13.5 13.5  PLT 183 139* 121*    Cardiac Enzymes Recent Labs  Lab 08/27/17 0215 08/27/17 0357 08/27/17 1020 08/27/17 1518  TROPONINI 10.29* 7.84* 11.38* 11.77*    Recent Labs  Lab 08/27/17 0155  TROPIPOC 5.58*     BNP Recent Labs  Lab 08/27/17 0215  BNP 883.5*       Patient Profile     72 year old male with past medical history of coronary artery disease treated medically ischemic cardiomyopathy status post ICD, chronic stage III kidney disease, diabetes mellitus, hypertension, hyperlipidemia for evaluation  of acute on chronic systolic congestive heart failure and non-ST elevation myocardial infarction.  Patient was playing golf Sunday and noticed dyspnea with climbing hills which is not unusual.  However his ICD fired twice.  He developed severe dyspnea and presented with congestive heart failure.  He was given Lasix and also ruled in for an infarct.  Echo shows ejection fraction 48-27%, grade 2 diastolic dysfunction, mild left atrial enlargement.    Assessment & Plan    1 acute on chronic systolic congestive heart failure-patient doing well this morning and denies dyspnea although mild wheezing noted.  He has ruled in for a non-ST elevation myocardial infarction.  I am holding diuretics prior to catheterization.  We will resume after when renal function stable.    2 Non-ST elevation myocardial infarction-patient has ruled in.  Continue aspirin, heparin, beta-blocker and statin.  Creatinine is improving.  We will plan cardiac catheterization tomorrow to make sure that his LAD has not progressed compared to previous catheterization.  The risks  and benefits including myocardial infarction, CVA, death and worsening renal function discussed and he agrees to proceed.  Hold diuretics prior to procedure.  No ventriculogram.  Limit diet.  Follow renal function closely after procedure.    3 ICD discharge-device interrogated.  Patient had ventricular fibrillation.  Continue amiodarone.  4  ischemic cardiomyopathy-continue beta-blocker.    ACE inhibitor on hold prior to catheterization.  Resume following procedure.  5 Chronic stage III kidney disease-follow renal function closely following catheterization.    6 diabetes mellitus    For questions or updates, please contact [place] Please consult [email or web address] for contact info under Cardiology/STEMI.      Signed, Kirk Ruths, MD  08/29/2017, 9:05 AM

## 2017-08-29 NOTE — Progress Notes (Signed)
Progress Note  Patient Name: Randy [name] Date of Encounter: 08/29/2017  Primary Cardiologist: Dr Lovena Le  Subjective   Denies dyspnea; no chest pain  Inpatient Medications    Scheduled Meds: . amiodarone  200 mg Oral BID  . aspirin EC  81 mg Oral Daily  . atorvastatin  80 mg Oral q1800  . carvedilol  12.5 mg Oral BID WC  . gabapentin  600 mg Oral BID  . insulin aspart  0-9 Units Subcutaneous TID WC  . ipratropium-albuterol  3 mL Nebulization TID  . sodium chloride flush  3 mL Intravenous Q12H   Continuous Infusions: . sodium chloride    . heparin 1,350 Units/hr (08/28/17 1735)   PRN Meds: sodium chloride, acetaminophen, ipratropium-albuterol, ondansetron (ZOFRAN) IV, sodium chloride flush   Vital Signs    Vitals:   08/28/17 2125 08/29/17 0340 08/29/17 0737 08/29/17 0812  BP: 108/65 115/62  108/77  Pulse: 73   (!) 57  Resp: 20   18  Temp: 98.2 F (36.8 C) 98.8 F (37.1 C)  97.7 F (36.5 C)  TempSrc: Oral Oral  Oral  SpO2: 95%  96% 97%  Weight:  192 lb 14.4 oz (87.5 kg)    Height:        Intake/Output Summary (Last 24 hours) at 08/29/2017 0905 Last data filed at 08/29/2017 0809 Gross per 24 hour  Intake 1122.15 ml  Output 1850 ml  Net -727.85 ml   Filed Weights   08/27/17 1503 08/28/17 0549 08/29/17 0340  Weight: 194 lb (88 kg) 191 lb 6.4 oz (86.8 kg) 192 lb 14.4 oz (87.5 kg)    Telemetry    Sinus with rare PVC- Personally Reviewed   Physical Exam   GEN: WD/WN No acute distress.   Neck: No JVD, supple Cardiac: RRR Respiratory: Mild exp wheeze GI: Soft, NT/ND MS: No edema Neuro:  Grossly intact   Labs    Chemistry Recent Labs  Lab 08/27/17 0144 08/28/17 0247 08/29/17 0446  NA 137 135 136  K 5.1 5.0 4.8  CL 104 100* 105  CO2 22 23 20*  GLUCOSE 172* 123* 107*  BUN 30* 37* 39*  CREATININE 1.70* 2.05* 1.91*  CALCIUM 9.1 8.8* 8.9  PROT  --  6.2*  --   ALBUMIN  --  3.8  --   AST  --  39  --   ALT  --  15*  --   ALKPHOS  --  33*   --   BILITOT  --  0.6  --   GFRNONAA 39* 31* 34*  GFRAA 45* 36* 39*  ANIONGAP 11 12 11      Hematology Recent Labs  Lab 08/27/17 0144 08/28/17 0247 08/29/17 0446  WBC 10.9* 8.2 6.4  RBC 4.39 3.75* 3.82*  HGB 12.6* 10.7* 10.9*  HCT 39.4 33.0* 33.5*  MCV 89.7 88.0 87.7  MCH 28.7 28.5 28.5  MCHC 32.0 32.4 32.5  RDW 13.7 13.5 13.5  PLT 183 139* 121*    Cardiac Enzymes Recent Labs  Lab 08/27/17 0215 08/27/17 0357 08/27/17 1020 08/27/17 1518  TROPONINI 10.29* 7.84* 11.38* 11.77*    Recent Labs  Lab 08/27/17 0155  TROPIPOC 5.58*     BNP Recent Labs  Lab 08/27/17 0215  BNP 883.5*       Patient Profile     72 year old male with past medical history of coronary artery disease treated medically ischemic cardiomyopathy status post ICD, chronic stage III kidney disease, diabetes mellitus, hypertension, hyperlipidemia for evaluation  of acute on chronic systolic congestive heart failure and non-ST elevation myocardial infarction.  Patient was playing golf Sunday and noticed dyspnea with climbing hills which is not unusual.  However his ICD fired twice.  He developed severe dyspnea and presented with congestive heart failure.  He was given Lasix and also ruled in for an infarct.  Echo shows ejection fraction 53-20%, grade 2 diastolic dysfunction, mild left atrial enlargement.    Assessment & Plan    1 acute on chronic systolic congestive heart failure-patient doing well this morning and denies dyspnea although mild wheezing noted.  He has ruled in for a non-ST elevation myocardial infarction.  I am holding diuretics prior to catheterization.  We will resume after when renal function stable.    2 Non-ST elevation myocardial infarction-patient has ruled in.  Continue aspirin, heparin, beta-blocker and statin.  Creatinine is improving.  We will plan cardiac catheterization tomorrow to make sure that his LAD has not progressed compared to previous catheterization.  The risks  and benefits including myocardial infarction, CVA, death and worsening renal function discussed and he agrees to proceed.  Hold diuretics prior to procedure.  No ventriculogram.  Limit diet.  Follow renal function closely after procedure.    3 ICD discharge-device interrogated.  Patient had ventricular fibrillation.  Continue amiodarone.  4  ischemic cardiomyopathy-continue beta-blocker.    ACE inhibitor on hold prior to catheterization.  Resume following procedure.  5 Chronic stage III kidney disease-follow renal function closely following catheterization.    6 diabetes mellitus    For questions or updates, please contact [place] Please consult [email or web address] for contact info under Cardiology/STEMI.      Signed, Kirk Ruths, MD  08/29/2017, 9:05 AM

## 2017-08-29 NOTE — Consult Note (Signed)
[facility] CM Primary Care Navigator  08/29/2017  Randy [name] 21-Aug-1945 [id number]    Met with patient and friend Randy [name]) at the bedsideto identify possible discharge needs. Patientreports "not being able to catch breath"whichhad led to this admission.  Patientendorses Dr. Grant Ruts with [facility] at [facility] as [place] care provider.   Patient verbalized using [place] on [employer] and [facility] serviceto obtain medications without difficulty.   Patient reportsmanaginghis ownmedications at home straight out of the containers.  Patient's friend statesthat he was driving prior to this admission. Denman George Randy [name]) will be able to providetransportation to hisdoctors'appointments after discharge.  [employer] transportation benefits discussed with patient as well.  Patient and friend live together at home and she will serve as his primary caregiver when discharged.  Patient hopes to be discharged home once medically stable.  Patientvoiced understanding to call primary care provider's officewhen hereturnshome,for a post discharge follow-up within a week or sooner if needs arise.Patient letter (with PCP's contact number) was provided as areminder.  According to patient, he has a schedule to follow-up with primary care provider on Tuesday 09/03/17 and will just have to keep that appointment as stated.  Patient was admitted for acute on chronic combined CHF (congestive heart failure) and NSTEMI ( Non-ST elevation myocardial infarction) with cardiology consulting.  Discussed with patient and friend regarding [facility] CM services available for health management at home but verbalized decline ofservices. Patient had refused[facility] services that wasoffered to him on different times, except for EMMI HF calls to follow-up his recovery.  He states that he is managing himself at home (DM and HF) with regular monitoring of  weight and blood sugar (encouraged recording results taken); diet restrictions; exercise; taking medications and close follow-up with providers when needed.   Referral made for EMMI HFcalls after discharge as opted by patient.  Encouraged patientto seekreferral from primary care provider to [facility] care management if deemednecessaryand appropriate for further services in the near future.  [facility] care management information was provided for future needs that he may have.   For questions, please contact:  Dannielle Huh, BSN, RN- [facility] Primary Care Navigator  Telephone: [phone] [place]

## 2017-08-29 NOTE — Progress Notes (Signed)
ANTICOAGULATION CONSULT NOTE - Follow Up Consult  Pharmacy Consult for Heparin  Indication: chest pain/ACS  No Known Allergies  Patient Measurements: Height: 5\' 7"  (170.2 cm) Weight: 192 lb 14.4 oz (87.5 kg) IBW/kg (Calculated) : 66.1  Vital Signs: Temp: 97.7 F (36.5 C) (01/31 0812) Temp Source: Oral (01/31 0812) BP: 108/77 (01/31 0812) Pulse Rate: 69 (01/31 0907)  Labs: Recent Labs    08/27/17 0144  08/27/17 0357 08/27/17 1020 08/27/17 1518  08/28/17 0247 08/28/17 1137 08/29/17 0446  HGB 12.6*  --   --   --   --   --  10.7*  --  10.9*  HCT 39.4  --   --   --   --   --  33.0*  --  33.5*  PLT 183  --   --   --   --   --  139*  --  121*  HEPARINUNFRC  --   --   --  0.34  --    < > 0.21* 0.53 0.54  CREATININE 1.70*  --   --   --   --   --  2.05*  --  1.91*  TROPONINI  --    < > 7.84* 11.38* 11.77*  --   --   --   --    < > = values in this interval not displayed.    Estimated Creatinine Clearance: 37.5 mL/min (A) (by C-G formula based on SCr of 1.91 mg/dL (H)).   Assessment: Heparin for r/o acs, unable to take to cath earlier d/t AKI. Heparin continues to be at goal on 1350 units/hr. No bleeding issues noted. Hgb down from admit of 12.6>10 but stable overnight.  Plan for cath tomorrow 2/1  Goal of Therapy:  Heparin level 0.3-0.7 units/ml Monitor platelets by anticoagulation protocol: Yes   Plan:  Cont heparin at current rate  Erin Hearing PharmD., BCPS Clinical Pharmacist 08/29/2017 9:49 AM

## 2017-08-29 NOTE — Progress Notes (Signed)
Patient refused CPAP for the night  

## 2017-08-30 ENCOUNTER — Other Ambulatory Visit: Payer: Self-pay | Admitting: *Deleted

## 2017-08-30 ENCOUNTER — Inpatient Hospital Stay (HOSPITAL_COMMUNITY)
Admission: EM | Disposition: A | Discharge status: Home / Self Care | Payer: Self-pay | Source: Home / Self Care | Attending: Surgery

## 2017-08-30 DIAGNOSIS — I25118 Atherosclerotic heart disease of native coronary artery with other forms of angina pectoris: Secondary | ICD-10-CM

## 2017-08-30 DIAGNOSIS — E119 Type 2 diabetes mellitus without complications: Secondary | ICD-10-CM

## 2017-08-30 DIAGNOSIS — I5043 Acute on chronic combined systolic (congestive) and diastolic (congestive) heart failure: Secondary | ICD-10-CM

## 2017-08-30 DIAGNOSIS — I2510 Atherosclerotic heart disease of native coronary artery without angina pectoris: Secondary | ICD-10-CM

## 2017-08-30 DIAGNOSIS — N183 Chronic kidney disease, stage 3 (moderate): Secondary | ICD-10-CM

## 2017-08-30 HISTORY — PX: LEFT HEART CATH AND CORONARY ANGIOGRAPHY: CATH118249

## 2017-08-30 LAB — CBC
HCT: 34.7 % — ABNORMAL LOW (ref 39.0–52.0)
HEMOGLOBIN: 11.7 g/dL — AB (ref 13.0–17.0)
MCH: 29.5 pg (ref 26.0–34.0)
MCHC: 33.7 g/dL (ref 30.0–36.0)
MCV: 87.6 fL (ref 78.0–100.0)
Platelets: 146 10*3/uL — ABNORMAL LOW (ref 150–400)
RBC: 3.96 MIL/uL — AB (ref 4.22–5.81)
RDW: 13.4 % (ref 11.5–15.5)
WBC: 7.4 10*3/uL (ref 4.0–10.5)

## 2017-08-30 LAB — BASIC METABOLIC PANEL
ANION GAP: 11 (ref 5–15)
BUN: 39 mg/dL — ABNORMAL HIGH (ref 6–20)
CALCIUM: 9.3 mg/dL (ref 8.9–10.3)
CO2: 23 mmol/L (ref 22–32)
Chloride: 104 mmol/L (ref 101–111)
Creatinine, Ser: 2.04 mg/dL — ABNORMAL HIGH (ref 0.61–1.24)
GFR, EST AFRICAN AMERICAN: 36 mL/min — AB (ref >60)
GFR, EST NON AFRICAN AMERICAN: 31 mL/min — AB (ref >60)
Glucose, Bld: 112 mg/dL — ABNORMAL HIGH (ref 65–99)
POTASSIUM: 5.1 mmol/L (ref 3.5–5.1)
Sodium: 138 mmol/L (ref 135–145)

## 2017-08-30 LAB — GLUCOSE, CAPILLARY
GLUCOSE-CAPILLARY: 99 mg/dL (ref 65–99)
GLUCOSE-CAPILLARY: 109 mg/dL — AB (ref 65–99)
Glucose-Capillary: 84 mg/dL (ref 65–99)
Glucose-Capillary: 91 mg/dL (ref 65–99)

## 2017-08-30 LAB — PROTIME-INR
INR: 1.13
PROTHROMBIN TIME: 14.4 s (ref 11.4–15.2)

## 2017-08-30 LAB — HEPARIN LEVEL (UNFRACTIONATED): HEPARIN UNFRACTIONATED: 0.49 [IU]/mL (ref 0.30–0.70)

## 2017-08-30 SURGERY — LEFT HEART CATH AND CORONARY ANGIOGRAPHY
Anesthesia: LOCAL

## 2017-08-30 MED ORDER — LIDOCAINE HCL 1 % IJ SOLN
INTRAMUSCULAR | Status: AC
  Filled 2017-08-30: qty 20

## 2017-08-30 MED ORDER — IOPAMIDOL (ISOVUE-370) INJECTION 76%
INTRAVENOUS | Status: DC | PRN
  Administered 2017-08-30: 45 mL via INTRAVENOUS

## 2017-08-30 MED ORDER — IOPAMIDOL (ISOVUE-370) INJECTION 76%
INTRAVENOUS | Status: AC
  Filled 2017-08-30: qty 100

## 2017-08-30 MED ORDER — HEPARIN SODIUM (PORCINE) 1000 UNIT/ML IJ SOLN
INTRAMUSCULAR | Status: AC
  Filled 2017-08-30: qty 1

## 2017-08-30 MED ORDER — HEPARIN (PORCINE) IN NACL 2-0.9 UNIT/ML-% IJ SOLN
INTRAMUSCULAR | Status: AC
  Filled 2017-08-30: qty 1000

## 2017-08-30 MED ORDER — FENTANYL CITRATE (PF) 100 MCG/2ML IJ SOLN
INTRAMUSCULAR | Status: DC | PRN
  Administered 2017-08-30: 25 ug via INTRAVENOUS

## 2017-08-30 MED ORDER — FENTANYL CITRATE (PF) 100 MCG/2ML IJ SOLN
INTRAMUSCULAR | Status: AC
  Filled 2017-08-30: qty 2

## 2017-08-30 MED ORDER — SODIUM CHLORIDE 0.9 % WEIGHT BASED INFUSION
1.0000 mL/kg/h | INTRAVENOUS | Status: AC
  Administered 2017-08-30: 1 mL/kg/h via INTRAVENOUS

## 2017-08-30 MED ORDER — SODIUM CHLORIDE 0.9% FLUSH: 3.0000 mL | INTRAVENOUS | Status: DC | PRN

## 2017-08-30 MED ORDER — VERAPAMIL HCL 2.5 MG/ML IV SOLN
INTRAVENOUS | Status: DC | PRN
  Administered 2017-08-30: 10 mL via INTRA_ARTERIAL

## 2017-08-30 MED ORDER — MIDAZOLAM HCL 2 MG/2ML IJ SOLN
INTRAMUSCULAR | Status: DC | PRN
  Administered 2017-08-30: 1 mg via INTRAVENOUS

## 2017-08-30 MED ORDER — HEPARIN SODIUM (PORCINE) 1000 UNIT/ML IJ SOLN
INTRAMUSCULAR | Status: DC | PRN
Start: 2017-08-30 — End: 2017-08-30
  Administered 2017-08-30: 4500 [IU] via INTRAVENOUS

## 2017-08-30 MED ORDER — LIDOCAINE HCL (PF) 1 % IJ SOLN
INTRAMUSCULAR | Status: DC | PRN
  Administered 2017-08-30: 2 mL

## 2017-08-30 MED ORDER — MIDAZOLAM HCL 2 MG/2ML IJ SOLN
INTRAMUSCULAR | Status: AC
  Filled 2017-08-30: qty 2

## 2017-08-30 MED ORDER — VERAPAMIL HCL 2.5 MG/ML IV SOLN
INTRAVENOUS | Status: AC
  Filled 2017-08-30: qty 2

## 2017-08-30 MED ORDER — SODIUM CHLORIDE 0.9% FLUSH
3.0000 mL | Freq: Two times a day (BID) | INTRAVENOUS | Status: DC
  Administered 2017-08-31 – 2017-09-01 (×2): 3 mL via INTRAVENOUS

## 2017-08-30 MED ORDER — SODIUM CHLORIDE 0.9 % IV SOLN: 250.0000 mL | INTRAVENOUS | Status: DC | PRN

## 2017-08-30 MED ORDER — HEPARIN (PORCINE) IN NACL 100-0.45 UNIT/ML-% IJ SOLN
1350.0000 [IU]/h | INTRAMUSCULAR | Status: DC
  Administered 2017-08-30 – 2017-08-31 (×2): 1350 [IU]/h via INTRAVENOUS
  Filled 2017-08-30 (×4): qty 250

## 2017-08-30 SURGICAL SUPPLY — 9 items
CATH 5FR JL3.5 JR4 ANG PIG MP (CATHETERS) ×2 IMPLANT
DEVICE RAD COMP TR BAND LRG (VASCULAR PRODUCTS) ×2 IMPLANT
GLIDESHEATH SLEND SS 6F .021 (SHEATH) ×2 IMPLANT
GUIDEWIRE INQWIRE 1.5J.035X260 (WIRE) ×1 IMPLANT
INQWIRE 1.5J .035X260CM (WIRE) ×2
KIT HEART LEFT (KITS) ×2 IMPLANT
PACK CARDIAC CATHETERIZATION (CUSTOM PROCEDURE TRAY) ×2 IMPLANT
TRANSDUCER W/STOPCOCK (MISCELLANEOUS) ×2 IMPLANT
TUBING CIL FLEX 10 FLL-RA (TUBING) ×2 IMPLANT

## 2017-08-30 NOTE — Consult Note (Signed)
[address]       [place] [address]             [phone]      Cardiothoracic Surgery Consultation  Reason for Consult: High grade left main and multi-vessel coronary artery disease Referring Physician: Dr. Peter Martinique  Randy [name] is an 72 y.o. male.  HPI:   The patient is a 72 year old gentleman with history of hypertension, dyslipidemia, type 2 diabetes, stage III chronic kidney disease with a creatinine of around 2, remote myocardial infarction with ischemic cardiomyopathy and an ejection fraction of 35% by echo in March 2018.  His cardiac history dates back to 2009 when he presented with shortness of breath and underwent a nuclear stress test which showed an ejection fraction of 32%.  There was evidence of prior inferolateral infarct with mild peri-infarct ischemia.  He had a cardiac catheterization at that time which showed an occluded proximal to mid right coronary artery with bridging collaterals filling the distal vessel.  There is mild left main narrowing.  He had a single chamber internal defibrillator placed by Dr. Lovena Le and has been followed by him since.  He underwent upgrade to a dual-chamber ICD with insertion of new atrial pacing lead in March 2018.  He reports having 2 shocks in November 2018 while playing golf.  Both shocks occurred during one round of golf and he had dizziness and felt poorly prior to each shock.  He reports having some chronic shortness of breath with exertion but has remained relatively active.  He has continued to play golf.  He is playing golf last Sunday and walk down a long hill and back up the hill with significant shortness of breath and then felt poorly.  He sat down his golf cart and put his head down and was shocked by the defibrillator.  He had a second shock soon after that.  He denies any dizziness prior to these shocks.  He has never had any chest discomfort.  His wife reports that he does get short of breath with  any significant activity and particularly when bending over.  He was admitted on 08/27/2017 with positive troponins peaking at 11.  An echocardiogram showed a drop in his ejection fraction to 25-30% with global hypokinesis.  There was no significant valvular disease.  Interrogation of his internal defibrillator showed that he had 3 episodes of ventricular tachycardia prior to admission 1 that was treated with antitachycardia pacing and tubes were treated with ICD shocks.  He was started on IV amiodarone.  He underwent cardiac catheterization earlier today that shows a diffusely diseased left main with 85% stenosis.  The ostial and proximal LAD has 50% stenosis.  There is a moderate size ramus branch that is 75% stenosis.  The left circumflex is a small vessel with 99% ostial stenosis.  The first marginal was occluded.  The right coronary artery has 90% proximal stenosis followed by complete occlusion in the midportion with bridging collaterals filling the distal vessel.  He had normal LVEDP.  The amiodarone was stopped because the arrhythmias were felt to be ischemic in nature.  Past Medical History:  Diagnosis Date  . AICD (automatic cardioverter/defibrillator) present 10/18/2016   "replaced 2009-[employer] device, Dr Lovena Le"  . Anemia   . Arthritis    "back" (08/27/2017)  . CAD (coronary artery disease)   . CHF (congestive heart failure) ([facility])   . CKD (chronic kidney disease) stage 3, GFR 30-59 ml/min (  [facility])    [name]  . Diverticulosis   . Dyslipidemia   . GERD (gastroesophageal reflux disease)   . Hypertension   . Neuromuscular disorder (HCC)    neuropathy- in hands  . Osteoarthritis    hips, hands  . Pacemaker   . Shortness of breath   . Sleep apnea    started a sleep study but didn't completed, states PCP told him he has sleep  apnea, doesn't use CPAP  . Systolic dysfunction   . Type 2 diabetes with nephropathy ([facility])   . Vitamin D deficiency     Past Surgical History:  Procedure  Laterality Date  . ANTERIOR CERVICAL DECOMP/DISCECTOMY FUSION    . BACK SURGERY    . CARPAL TUNNEL RELEASE Left   . CYST EXCISION     "back"  . ICD IMPLANT N/A 10/18/2016   Procedure: ICD Implant- Upgrade to Dual Chamber;  Surgeon: Evans Lance, MD;  Location: [facility];  Service: Cardiovascular;  Laterality: N/A;  . INSERT / REPLACE / REMOVE PACEMAKER    . JOINT REPLACEMENT    . LUMBAR LAMINECTOMY Left 07/17/2013   Procedure: MICRODISCECTOMY LUMBAR LAMINECTOMY;  Surgeon: Marybelle Killings, MD;  Location: [facility];  Service: Orthopedics;  Laterality: Left;  Left L4 Laminotomy, Lateral Recess Decompression, Cyst Excision  . SHOULDER OPEN ROTATOR CUFF REPAIR Left   . TOTAL HIP ARTHROPLASTY Left 09/17/2012   Procedure: Left TOTAL HIP ARTHROPLASTY ANTERIOR APPROACH;  Surgeon: Marybelle Killings, MD;  Location: [facility];  Service: Orthopedics;  Laterality: Left;    Family History  Problem Relation Age of Onset  . Heart attack Father 41  . Diabetes Brother     Social History:  reports that he quit smoking about 33 years ago. His smoking use included cigarettes. He has a 34.00 pack-year smoking history. he has never used smokeless tobacco. He reports that he drinks alcohol. He reports that he does not use drugs.  Allergies: No Known Allergies  Medications:  I have reviewed the patient's current medications. Prior to Admission:  Medications Prior to Admission  Medication Sig Dispense Refill Last Dose  . albuterol (PROVENTIL HFA;VENTOLIN HFA) 108 (90 Base) MCG/ACT inhaler Inhale 2 puffs into the lungs every 6 (six) hours as needed for wheezing or shortness of breath. 1 Inhaler 0 08/26/2017 at Unknown time  . aspirin EC 81 MG tablet Take 81 mg by mouth daily.   08/26/2017 at Unknown time  . carvedilol (COREG) 12.5 MG tablet TAKE 1 TABLET TWICE DAILY WITH A MEAL 180 tablet 3 08/26/2017 at 1800  . furosemide (LASIX) 20 MG tablet Take 1 tablet (20 mg total) by mouth daily. 90 tablet 3 08/26/2017 at Unknown  time  . gabapentin (NEURONTIN) 300 MG capsule Take 2 capsules (600 mg total) by mouth 2 (two) times daily. 60 capsule 0 08/26/2017 at Unknown time  . glipiZIDE (GLUCOTROL) 5 MG tablet TAKE 1 TABLET EVERY DAY BEFORE BREAKFAST 90 tablet 1 08/26/2017 at Unknown time  . lisinopril (PRINIVIL,ZESTRIL) 2.5 MG tablet TAKE 1 TABLET EVERY DAY 90 tablet 1 08/26/2017 at Unknown time  . lovastatin (MEVACOR) 20 MG tablet TAKE 1 TABLET AT BEDTIME 90 tablet 1 08/26/2017 at Unknown time  . meloxicam (MOBIC) 15 MG tablet Take 1 tablet (15 mg total) by mouth daily. 90 tablet 1 08/26/2017 at Unknown time  . tamsulosin (FLOMAX) 0.4 MG CAPS capsule TAKE 1 CAPSULE EVERY DAY 90 capsule 1 08/26/2017 at Unknown time  . ACCU-CHEK SOFTCLIX LANCETS lancets  Use as instructed to check blood sugar once daily and as needed.  Diagnosis:  E11.21  Non- insulin dependent. 100 each 3 Taking  . Alcohol Swabs (B-D SINGLE USE SWABS REGULAR) PADS Use as instructed to clean area for glucose monitoring once daily and as needed.  Diagnosis:  E11.21  Non insulin-dependent 100 each 3 Taking  . Blood Glucose Calibration (ACCU-CHEK AVIVA) SOLN Use to calibrate blood glucose machine as recommended.  Diagnosis:  E11.21  Non-insulin dependent. 1 each 2 Taking  . Blood Glucose Monitoring Suppl (ACCU-CHEK AVIVA) device Use to check blood sugar once daily.  Dx: E11.21 1 each 0 Taking  . glucose blood (ACCU-CHEK AVIVA PLUS) test strip Test Blood Sugar 1 time daily: Dx:E11.27 100 each 3    Scheduled: . aspirin EC  81 mg Oral Daily  . atorvastatin  80 mg Oral q1800  . carvedilol  12.5 mg Oral BID WC  . gabapentin  600 mg Oral BID  . insulin aspart  0-9 Units Subcutaneous TID WC  . ipratropium-albuterol  3 mL Nebulization TID  . polyethylene glycol  17 g Oral Daily  . senna  2 tablet Oral Daily  . sodium chloride flush  3 mL Intravenous Q12H  . sodium chloride flush  3 mL Intravenous Q12H   Continuous: . sodium chloride    . sodium chloride    . sodium  chloride 1 mL/kg/hr (08/30/17 1008)  . heparin     EXB:MWUXLK chloride, sodium chloride, acetaminophen, ipratropium-albuterol, ondansetron (ZOFRAN) IV, sodium chloride flush, sodium chloride flush Anti-infectives (From admission, onward)   None      Results for orders placed or performed during the hospital encounter of 08/27/17 (from the past 48 hour(s))  Glucose, capillary     Status: Abnormal   Collection Time: 08/28/17  5:43 PM  Result Value Ref Range   Glucose-Capillary 134 (H) 65 - 99 mg/dL   Comment 1 Notify RN    Comment 2 Document in Chart   Glucose, capillary     Status: Abnormal   Collection Time: 08/28/17  9:21 PM  Result Value Ref Range   Glucose-Capillary 102 (H) 65 - 99 mg/dL  Heparin level (unfractionated)     Status: None   Collection Time: 08/29/17  4:46 AM  Result Value Ref Range   Heparin Unfractionated 0.54 0.30 - 0.70 IU/mL    Comment:        IF HEPARIN RESULTS ARE BELOW EXPECTED VALUES, AND PATIENT DOSAGE HAS BEEN CONFIRMED, SUGGEST FOLLOW UP TESTING OF ANTITHROMBIN III LEVELS.   CBC     Status: Abnormal   Collection Time: 08/29/17  4:46 AM  Result Value Ref Range   WBC 6.4 4.0 - 10.5 K/uL   RBC 3.82 (L) 4.22 - 5.81 MIL/uL   Hemoglobin 10.9 (L) 13.0 - 17.0 g/dL   HCT 33.5 (L) 39.0 - 52.0 %   MCV 87.7 78.0 - 100.0 fL   MCH 28.5 26.0 - 34.0 pg   MCHC 32.5 30.0 - 36.0 g/dL   RDW 13.5 11.5 - 15.5 %   Platelets 121 (L) 150 - 400 K/uL  Basic metabolic panel     Status: Abnormal   Collection Time: 08/29/17  4:46 AM  Result Value Ref Range   Sodium 136 135 - 145 mmol/L   Potassium 4.8 3.5 - 5.1 mmol/L   Chloride 105 101 - 111 mmol/L   CO2 20 (L) 22 - 32 mmol/L   Glucose, Bld 107 (H) 65 - 99 mg/dL  BUN 39 (H) 6 - 20 mg/dL   Creatinine, Ser 1.91 (H) 0.61 - 1.24 mg/dL   Calcium 8.9 8.9 - 10.3 mg/dL   GFR calc non Af Amer 34 (L) >60 mL/min   GFR calc Af Amer 39 (L) >60 mL/min    Comment: (NOTE) The eGFR has been calculated using the CKD EPI  equation. This calculation has not been validated in all clinical situations. eGFR's persistently <60 mL/min signify possible Chronic Kidney Disease.    Anion gap 11 5 - 15  Glucose, capillary     Status: Abnormal   Collection Time: 08/29/17  6:36 AM  Result Value Ref Range   Glucose-Capillary 103 (H) 65 - 99 mg/dL  Glucose, capillary     Status: Abnormal   Collection Time: 08/29/17 11:49 AM  Result Value Ref Range   Glucose-Capillary 105 (H) 65 - 99 mg/dL   Comment 1 Notify RN    Comment 2 Document in Chart   Glucose, capillary     Status: Abnormal   Collection Time: 08/29/17  4:19 PM  Result Value Ref Range   Glucose-Capillary 108 (H) 65 - 99 mg/dL   Comment 1 Notify RN    Comment 2 Document in Chart   Glucose, capillary     Status: Abnormal   Collection Time: 08/29/17  8:38 PM  Result Value Ref Range   Glucose-Capillary 123 (H) 65 - 99 mg/dL  Heparin level (unfractionated)     Status: None   Collection Time: 08/30/17  2:06 AM  Result Value Ref Range   Heparin Unfractionated 0.49 0.30 - 0.70 IU/mL    Comment:        IF HEPARIN RESULTS ARE BELOW EXPECTED VALUES, AND PATIENT DOSAGE HAS BEEN CONFIRMED, SUGGEST FOLLOW UP TESTING OF ANTITHROMBIN III LEVELS.   Basic metabolic panel     Status: Abnormal   Collection Time: 08/30/17  2:06 AM  Result Value Ref Range   Sodium 138 135 - 145 mmol/L   Potassium 5.1 3.5 - 5.1 mmol/L   Chloride 104 101 - 111 mmol/L   CO2 23 22 - 32 mmol/L   Glucose, Bld 112 (H) 65 - 99 mg/dL   BUN 39 (H) 6 - 20 mg/dL   Creatinine, Ser 2.04 (H) 0.61 - 1.24 mg/dL   Calcium 9.3 8.9 - 10.3 mg/dL   GFR calc non Af Amer 31 (L) >60 mL/min   GFR calc Af Amer 36 (L) >60 mL/min    Comment: (NOTE) The eGFR has been calculated using the CKD EPI equation. This calculation has not been validated in all clinical situations. eGFR's persistently <60 mL/min signify possible Chronic Kidney Disease.    Anion gap 11 5 - 15  CBC     Status: Abnormal    Collection Time: 08/30/17  2:06 AM  Result Value Ref Range   WBC 7.4 4.0 - 10.5 K/uL   RBC 3.96 (L) 4.22 - 5.81 MIL/uL   Hemoglobin 11.7 (L) 13.0 - 17.0 g/dL   HCT 34.7 (L) 39.0 - 52.0 %   MCV 87.6 78.0 - 100.0 fL   MCH 29.5 26.0 - 34.0 pg   MCHC 33.7 30.0 - 36.0 g/dL   RDW 13.4 11.5 - 15.5 %   Platelets 146 (L) 150 - 400 K/uL  Protime-INR     Status: None   Collection Time: 08/30/17  2:06 AM  Result Value Ref Range   Prothrombin Time 14.4 11.4 - 15.2 seconds   INR 1.13   Glucose, capillary  Status: None   Collection Time: 08/30/17  6:09 AM  Result Value Ref Range   Glucose-Capillary 99 65 - 99 mg/dL  Glucose, capillary     Status: None   Collection Time: 08/30/17 11:49 AM  Result Value Ref Range   Glucose-Capillary 84 65 - 99 mg/dL   Comment 1 Notify RN    Comment 2 Document in Chart     No results found.  Review of Systems  Constitutional: Positive for malaise/fatigue. Negative for chills, fever and weight loss.  HENT: Negative.   Eyes: Negative.   Respiratory: Positive for shortness of breath. Negative for cough.   Cardiovascular: Negative for chest pain, palpitations, orthopnea, leg swelling and PND.  Gastrointestinal: Negative.   Genitourinary: Negative.   Musculoskeletal: Negative.   Skin: Negative.   Neurological: Positive for sensory change. Negative for dizziness, seizures and loss of consciousness.       Neuropathy in hands with numbness  Endo/Heme/Allergies: Negative.   Psychiatric/Behavioral: Negative.    Blood pressure 127/75, pulse 64, temperature 97.8 F (36.6 C), temperature source Oral, resp. rate 17, height '5\' 7"'  (1.702 m), weight 85.8 kg (189 lb 1.6 oz), SpO2 94 %. Physical Exam  Constitutional: He is oriented to person, place, and time. He appears well-developed and well-nourished. No distress.  HENT:  Head: Normocephalic and atraumatic.  Mouth/Throat: Oropharynx is clear and moist.  Eyes: EOM are normal. Pupils are equal, round, and reactive  to light.  Neck: Normal range of motion. Neck supple. No JVD present. No thyromegaly present.  Cardiovascular: Normal rate, regular rhythm, normal heart sounds and intact distal pulses.  No murmur heard. Respiratory: Effort normal and breath sounds normal. No respiratory distress.  GI: Soft. Bowel sounds are normal. He exhibits no distension. There is no tenderness.  Musculoskeletal: Normal range of motion. He exhibits no edema.  Lymphadenopathy:    He has no cervical adenopathy.  Neurological: He is alert and oriented to person, place, and time. He has normal strength. No cranial nerve deficit.        *[facility]*                         [address], [place] [address]                            [phone]  ------------------------------------------------------------------- Transthoracic Echocardiography  Patient:    Wiley, Magan MR #:       [id number] Study Date: 08/27/2017 Gender:     M Age:        61 Height:     170.2 cm Weight:     88 kg BSA:        2.07 m^2 Pt. Status: Room:       4E22C   ATTENDING    Reyne Dumas [id number]  PERFORMING   [facility], Inpatient  SONOGRAPHER  Darlina Sicilian, RDCS  ORDERING     Tamala Julian, Rondell A  REFERRING    Tamala Julian, Rondell A  cc:  ------------------------------------------------------------------- LV EF: 25% -   30%  ------------------------------------------------------------------- Indications:      CHF - 428.0.  ------------------------------------------------------------------- History:   PMH:  Chronic Kidney  Disease. Shortness of Breath. Coronary artery disease.  Risk factors:  Hypertension. Diabetes mellitus. Dyslipidemia.  ------------------------------------------------------------------- Study Conclusions  - Left ventricle: The cavity size was normal. Systolic function was   severely reduced. The estimated ejection fraction was in  the   range of 25% to 30%. Diffuse hypokinesis. Features are consistent   with a pseudonormal left ventricular filling pattern, with   concomitant abnormal relaxation and increased filling pressure   (grade 2 diastolic dysfunction). Doppler parameters are   consistent with high ventricular filling pressure. - Aortic valve: Transvalvular velocity was within the normal range.   There was no stenosis. There was no regurgitation. - Mitral valve: Transvalvular velocity was within the normal range.   There was no evidence for stenosis. There was trivial   regurgitation. - Left atrium: The atrium was mildly dilated. - Right ventricle: The cavity size was normal. Wall thickness was   normal. Systolic function was normal. - Tricuspid valve: There was trivial regurgitation. - Pulmonary arteries: Systolic pressure was within the normal   range. PA peak pressure: 18 mm Hg (S).  ------------------------------------------------------------------- Labs, prior tests, procedures, and surgery: ACID.  ------------------------------------------------------------------- Study data:  Comparison was made to the study of 03/13/2017.  Study status:  Routine.  Procedure:  The patient reported no pain pre or post test. Transthoracic echocardiography. Image quality was suboptimal. The study was technically difficult, as a result of poor acoustic windows. Intravenous contrast (Definity) was administered.  Study completion:  There were no complications.     Transthoracic echocardiography.  M-mode, complete 2D, spectral Doppler, and color Doppler.  Birthdate:  Patient birthdate: [birth year].  Age:  Patient is 72 yr old.  Sex:  Gender: male. BMI: 30.4 kg/m^2.  Blood pressure:     112/68  Patient status: Inpatient.  Study date:  Study date: 08/27/2017. Study time: 12:56 PM.  Location:  Emergency  department.  -------------------------------------------------------------------  ------------------------------------------------------------------- Left ventricle:  The cavity size was normal. Systolic function was severely reduced. The estimated ejection fraction was in the range of 25% to 30%. Diffuse hypokinesis. Features are consistent with a pseudonormal left ventricular filling pattern, with concomitant abnormal relaxation and increased filling pressure (grade 2 diastolic dysfunction). Doppler parameters are consistent with high ventricular filling pressure.  ------------------------------------------------------------------- Aortic valve:   Trileaflet; mildly thickened, mildly calcified leaflets. Mobility was not restricted.  Doppler:  Transvalvular velocity was within the normal range. There was no stenosis. There was no regurgitation.  ------------------------------------------------------------------- Aorta:  Aortic root: The aortic root was normal in size.  ------------------------------------------------------------------- Mitral valve:   Structurally normal valve.   Mobility was not restricted.  Doppler:  Transvalvular velocity was within the normal range. There was no evidence for stenosis. There was trivial regurgitation.    Peak gradient (D): 3 mm Hg.  ------------------------------------------------------------------- Left atrium:  The atrium was mildly dilated.  ------------------------------------------------------------------- Right ventricle:  The cavity size was normal. Wall thickness was normal. Pacer wire or catheter noted in right ventricle. Systolic function was normal.  ------------------------------------------------------------------- Pulmonic valve:    Structurally normal valve.   Cusp separation was normal.  Doppler:  Transvalvular velocity was within the normal range. There was no evidence for stenosis. There was  no regurgitation.  ------------------------------------------------------------------- Tricuspid valve:   Structurally normal valve.    Doppler: Transvalvular velocity was within the normal range. There was trivial regurgitation.  ------------------------------------------------------------------- Pulmonary artery:   The main pulmonary artery was normal-sized. Systolic pressure was within the normal range.  ------------------------------------------------------------------- Right atrium:  The atrium was  normal in size.  ------------------------------------------------------------------- Pericardium:  There was no pericardial effusion.  ------------------------------------------------------------------- Systemic veins: Inferior vena cava: The vessel was normal in size. The respirophasic diameter changes were blunted (< 50%), consistent with normal central venous pressure.  ------------------------------------------------------------------- Measurements   Left ventricle                           Value        Reference  LV ID, ED, PLAX chordal                  46.64 mm     43 - 52  LV ID, ES, PLAX chordal          (H)     39.31 mm     23 - 38  LV fx shortening, PLAX chordal   (L)     16    %      >=29  LV PW thickness, ED                      15.45 mm     ----------  IVS/LV PW ratio, ED                      0.91         <=1.3  Stroke volume, 2D                        54    ml     ----------  Stroke volume/bsa, 2D                    26    ml/m^2 ----------  LV ejection fraction, 1-p A4C            31    %      ----------  LV end-diastolic volume, 2-p             182   ml     ----------  LV end-systolic volume, 2-p              134   ml     ----------  LV ejection fraction, 2-p                26    %      ----------  Stroke volume, 2-p                       48    ml     ----------  LV end-diastolic volume/bsa, 2-p         88    ml/m^2 ----------  LV end-systolic volume/bsa,  2-p          65    ml/m^2 ----------  Stroke volume/bsa, 2-p                   23.2  ml/m^2 ----------  LV e&', lateral                           4.35  cm/s   ----------  LV E/e&', lateral                         18.28        ----------  LV e&', medial  4.03  cm/s   ----------  LV E/e&', medial                          19.73        ----------  LV e&', average                           4.19  cm/s   ----------  LV E/e&', average                         18.97        ----------    Ventricular septum                       Value        Reference  IVS thickness, ED                        14.12 mm     ----------    LVOT                                     Value        Reference  LVOT ID, S                               21    mm     ----------  LVOT area                                3.46  cm^2   ----------  LVOT peak velocity, S                    67.2  cm/s   ----------  LVOT mean velocity, S                    49.7  cm/s   ----------  LVOT VTI, S                              15.7  cm     ----------  LVOT peak gradient, S                    2     mm Hg  ----------    Aorta                                    Value        Reference  Aortic root ID, ED                       29.92 mm     ----------    Left atrium                              Value        Reference  LA ID, A-P, ES  36.91 mm     ----------  LA ID/bsa, A-P                           1.79  cm/m^2 <=2.2  LA volume, S                             56.7  ml     ----------  LA volume/bsa, S                         27.4  ml/m^2 ----------  LA volume, ES, 1-p A4C                   52.7  ml     ----------  LA volume/bsa, ES, 1-p A4C               25.5  ml/m^2 ----------  LA volume, ES, 1-p A2C                   60.7  ml     ----------  LA volume/bsa, ES, 1-p A2C               29.4  ml/m^2 ----------    Mitral valve                             Value        Reference  Mitral E-wave peak  velocity              79.5  cm/s   ----------  Mitral A-wave peak velocity              66    cm/s   ----------  Mitral deceleration time                 183   ms     150 - 230  Mitral peak gradient, D                  3     mm Hg  ----------  Mitral E/A ratio, peak                   1.2          ----------    Pulmonary arteries                       Value        Reference  PA pressure, S, DP                       18    mm Hg  <=30    Tricuspid valve                          Value        Reference  Tricuspid regurg peak velocity           159   cm/s   ----------  Tricuspid peak RV-RA gradient            10    mm Hg  ----------    Right atrium  Value        Reference  RA ID, S-I, ES, A4C              (H)     50    mm     34 - 49  RA area, ES, A4C                         17.2  cm^2   8.3 - 19.5  RA volume, ES, A/L                       47.1  ml     ----------  RA volume/bsa, ES, A/L                   22.8  ml/m^2 ----------    Systemic veins                           Value        Reference  Estimated CVP                            8     mm Hg  ----------    Right ventricle                          Value        Reference  TAPSE                                    16.2  mm     ----------  RV pressure, S, DP                       18    mm Hg  <=30  RV s&', lateral, S                        10.7  cm/s   ----------  Legend: (L)  and  (H)  mark values outside specified reference range.  ------------------------------------------------------------------- Prepared and Electronically Authenticated by  Skeet Latch, MD 2019-01-29T15:44:09   Physicians   Panel Physicians Referring Physician Case Authorizing Physician  Martinique, Peter M, MD (Primary)    Procedures   LEFT HEART CATH AND CORONARY ANGIOGRAPHY  Conclusion     Prox RCA lesion is 90% stenosed.  Mid RCA lesion is 100% stenosed.  Ost LM to Mid LM lesion is 85% stenosed.  Ost LAD to Prox  LAD lesion is 50% stenosed.  Ost 1st Diag to 1st Diag lesion is 70% stenosed.  Ost Ramus to Ramus lesion is 75% stenosed.  Ost Cx to Prox Cx lesion is 99% stenosed.  Ost 1st Mrg lesion is 100% stenosed.  LV end diastolic pressure is normal.   1. Severe 3 vessel and left main obstructive CAD 2. Normal LVEDP  Plan: will consult CT surgery for CABG   Indications   Non-ST elevation (NSTEMI) myocardial infarction ([facility]) [I21.4 (ICD-10-CM)]  Procedural Details/Technique   Technical Details Indication: 72 yo WM with history of CAD presents with worsening CHF and elevated troponin  Procedural Details: The right wrist was prepped, draped, and anesthetized with 1% lidocaine. Using the modified Seldinger technique, a 6 French slender sheath was introduced  into the right radial artery. 3 mg of verapamil was administered through the sheath, weight-based unfractionated heparin was administered intravenously. Standard Judkins catheters were used for selective coronary angiography and left ventricular pressures. Catheter exchanges were performed over an exchange length guidewire. There were no immediate procedural complications. A TR band was used for radial hemostasis at the completion of the procedure. The patient was transferred to the post catheterization recovery area for further monitoring. Contrast: 45 cc   Estimated blood loss <50 mL.  During this procedure the patient was administered the following to achieve and maintain moderate conscious sedation: Versed 1 mg, Fentanyl 25 mcg, while the patient's heart rate, blood pressure, and oxygen saturation were continuously monitored. The period of conscious sedation was 10 minutes, of which I was present face-to-face 100% of this time.  Complications   Complications documented before study signed (08/30/2017 9:51 AM EST)    No complications were associated with this study.  Documented by Martinique, Peter M, MD - 08/30/2017 9:48 AM EST    Coronary  Findings   Diagnostic  Dominance: Right  Left Main  Ost LM to Mid LM lesion 85% stenosed  Ost LM to Mid LM lesion is 85% stenosed.  Left Anterior Descending  Ost LAD to Prox LAD lesion 50% stenosed  Ost LAD to Prox LAD lesion is 50% stenosed.  First Diagonal Branch  Ost 1st Diag to 1st Diag lesion 70% stenosed  Ost 1st Diag to 1st Diag lesion is 70% stenosed.  Ramus Intermedius  Ost Ramus to Ramus lesion 75% stenosed  Ost Ramus to Ramus lesion is 75% stenosed.  Left Circumflex  Ost Cx to Prox Cx lesion 99% stenosed  Ost Cx to Prox Cx lesion is 99% stenosed.  First Obtuse Marginal Branch  Ost 1st Mrg lesion 100% stenosed  Ost 1st Mrg lesion is 100% stenosed.  Right Coronary Artery  Prox RCA lesion 90% stenosed  Prox RCA lesion is 90% stenosed.  Mid RCA lesion 100% stenosed  Mid RCA lesion is 100% stenosed. The lesion is chronically occluded with bridging and right-to-right collateral flow.  Acute Marginal Branch  Collaterals  Acute Mrg filled by collaterals from Prox RCA.    Intervention   No interventions have been documented.  Left Heart   Left Ventricle LV end diastolic pressure is normal.  Coronary Diagrams   Diagnostic Diagram       Implants     No implant documentation for this case.  MERGE Images   Show images for CARDIAC CATHETERIZATION   Link to Procedure Log   Procedure Log    Hemo Data    Most Recent Value  AO Systolic Pressure 93 mmHg  AO Diastolic Pressure 49 mmHg  AO Mean 66 mmHg  LV Systolic Pressure 532 mmHg  LV Diastolic Pressure 2 mmHg  LV EDP 17 mmHg  Arterial Occlusion Pressure Extended Systolic Pressure 992 mmHg  Arterial Occlusion Pressure Extended Diastolic Pressure 48 mmHg  Arterial Occlusion Pressure Extended Mean Pressure 66 mmHg  Left Ventricular Apex Extended Systolic Pressure 426 mmHg  Left Ventricular Apex Extended Diastolic Pressure 3 mmHg  Left Ventricular Apex Extended EDP Pressure 19 mmHg     Assessment/Plan:  This 72 year old gentleman has high-grade left main coronary stenosis with a small nondominant left circumflex that has an occluded obtuse marginal branch and occlusion of the mid right coronary artery with filling of the small distal dominant right coronary artery by bridging collaterals.  I have personally reviewed his cardiac catheterization films and  compare them to the films in 2009 and the only significant change is that his left main is now severely diseased.  This is the predominant source of blood supply to his heart.  He has ischemic cardiomyopathy with ejection fraction of 25% which is down from 35% last March.  I agree that coronary artery bypass graft surgery is the best treatment for this patient to prevent further loss of myocardium and death from left main occlusion.  His operative risk is increased due to his poor ejection fraction and stage III chronic kidney disease with a creatinine of 2.0. I discussed the operative procedure with the patient and his wife including alternatives, benefits and risks; including but not limited to bleeding, blood transfusion, infection, stroke, myocardial infarction, graft failure, heart block requiring a permanent pacemaker, organ dysfunction, and death.  [name] understands and agrees to proceed.  We will schedule surgery for Monday assuming that his creatinine remains stable.  I spent 60 minutes performing this consultation and > 50% of this time was spent face to face counseling and coordinating the care of this patient's severe multi-vessel coronary artery disease.  [name] 08/30/2017, 3:58 PM

## 2017-08-30 NOTE — Progress Notes (Signed)
Progress Note  Patient Name: Randy [name] Date of Encounter: 08/30/2017  Primary Cardiologist: Dr Lovena Le  Subjective   No CP or dyspnea  Inpatient Medications    Scheduled Meds: . amiodarone  200 mg Oral BID  . aspirin EC  81 mg Oral Daily  . atorvastatin  80 mg Oral q1800  . carvedilol  12.5 mg Oral BID WC  . gabapentin  600 mg Oral BID  . insulin aspart  0-9 Units Subcutaneous TID WC  . ipratropium-albuterol  3 mL Nebulization TID  . polyethylene glycol  17 g Oral Daily  . senna  2 tablet Oral Daily  . sodium chloride flush  3 mL Intravenous Q12H  . sodium chloride flush  3 mL Intravenous Q12H   Continuous Infusions: . sodium chloride    . sodium chloride    . sodium chloride 1 mL/kg/hr (08/30/17 1008)   PRN Meds: sodium chloride, sodium chloride, acetaminophen, ipratropium-albuterol, ondansetron (ZOFRAN) IV, sodium chloride flush, sodium chloride flush   Vital Signs    Vitals:   08/30/17 0923 08/30/17 0928 08/30/17 0933 08/30/17 0938  BP: 106/74 113/68 (!) 102/59 102/63  Pulse: (!) 57 (!) 57 (!) 56 60  Resp: 16 14 14 13   Temp:      TempSrc:      SpO2: 99% 93% 96% 97%  Weight:      Height:        Intake/Output Summary (Last 24 hours) at 08/30/2017 1239 Last data filed at 08/30/2017 1100 Gross per 24 hour  Intake 1080 ml  Output 2675 ml  Net -1595 ml   Filed Weights   08/28/17 0549 08/29/17 0340 08/30/17 0257  Weight: 191 lb 6.4 oz (86.8 kg) 192 lb 14.4 oz (87.5 kg) 189 lb 1.6 oz (85.8 kg)    Telemetry    Sinus with occasional paced beat- Personally Reviewed   Physical Exam   GEN: NAD Neck: Supple Cardiac: RRR Respiratory: CTA GI: Soft, NT/ND, no masses MS: TR band in place Neuro:  no focal findings   Labs    Chemistry Recent Labs  Lab 08/28/17 0247 08/29/17 0446 08/30/17 0206  NA 135 136 138  K 5.0 4.8 5.1  CL 100* 105 104  CO2 23 20* 23  GLUCOSE 123* 107* 112*  BUN 37* 39* 39*  CREATININE 2.05* 1.91* 2.04*  CALCIUM 8.8*  8.9 9.3  PROT 6.2*  --   --   ALBUMIN 3.8  --   --   AST 39  --   --   ALT 15*  --   --   ALKPHOS 33*  --   --   BILITOT 0.6  --   --   GFRNONAA 31* 34* 31*  GFRAA 36* 39* 36*  ANIONGAP 12 11 11      Hematology Recent Labs  Lab 08/28/17 0247 08/29/17 0446 08/30/17 0206  WBC 8.2 6.4 7.4  RBC 3.75* 3.82* 3.96*  HGB 10.7* 10.9* 11.7*  HCT 33.0* 33.5* 34.7*  MCV 88.0 87.7 87.6  MCH 28.5 28.5 29.5  MCHC 32.4 32.5 33.7  RDW 13.5 13.5 13.4  PLT 139* 121* 146*    Cardiac Enzymes Recent Labs  Lab 08/27/17 0215 08/27/17 0357 08/27/17 1020 08/27/17 1518  TROPONINI 10.29* 7.84* 11.38* 11.77*    Recent Labs  Lab 08/27/17 0155  TROPIPOC 5.58*     BNP Recent Labs  Lab 08/27/17 0215  BNP 883.5*       Patient Profile     72 year old male  with past medical history of coronary artery disease treated medically ischemic cardiomyopathy status post ICD, chronic stage III kidney disease, diabetes mellitus, hypertension, hyperlipidemia for evaluation of acute on chronic systolic congestive heart failure and non-ST elevation myocardial infarction.  Patient was playing golf Sunday and noticed dyspnea with climbing hills which is not unusual.  However his ICD fired twice.  He developed severe dyspnea and presented with congestive heart failure.  He was given Lasix and also ruled in for an infarct.  Echo shows ejection fraction 16-10%, grade 2 diastolic dysfunction, mild left atrial enlargement. Cath 08/30/17 shows severe 3 vessel CAD including LM.     Assessment & Plan    1 acute on chronic systolic congestive heart failure-Not volume overloaded; diuretics on hold post cath until renal function stable; resume later.  2 Non-ST elevation myocardial infarction-Continue aspirin, heparin, beta-blocker and statin.  Creatinine is improving.  Cath reveals severe CAD including LM. CVTS consult for CABG.   3 ICD discharge-device interrogated.  Patient had ventricular fibrillation. DC  amiodarone as arrhythmia likely ischemia induced.  4  ischemic cardiomyopathy-continue beta-blocker.  ACE I on hold until renal function stable post cath.  5 Chronic stage III kidney disease-at risk for contrast nephropathy; follow renal function.  6 diabetes mellitus    For questions or updates, please contact [place] Please consult [email or web address] for contact info under Cardiology/STEMI.      Signed, Kirk Ruths, MD  08/30/2017, 12:39 PM

## 2017-08-30 NOTE — Progress Notes (Signed)
Deflated 2 cc out of pt TR band, right radial site began to bleed. Inflated TR band to 13 cc, bleeding stopped. Educated pt on importance of not using arm. Will continue to monitor.  Clyde Canterbury, RN

## 2017-08-30 NOTE — Interval H&P Note (Signed)
History and Physical Interval Note:  08/30/2017 9:10 AM  Randy [name]  has presented today for surgery, with the diagnosis of nstemi  The various methods of treatment have been discussed with the patient and family. After consideration of risks, benefits and other options for treatment, the patient has consented to  Procedure(s): LEFT HEART CATH AND CORONARY ANGIOGRAPHY (N/A) as a surgical intervention .  The patient's history has been reviewed, patient examined, no change in status, stable for surgery.  I have reviewed the patient's chart and labs.  Questions were answered to the patient's satisfaction.   Cath Lab Visit (complete for each Cath Lab visit)  Clinical Evaluation Leading to the Procedure:   ACS: Yes.    Non-ACS:    Anginal Classification: CCS III  Anti-ischemic medical therapy: Maximal Therapy (2 or more classes of medications)  Non-Invasive Test Results: No non-invasive testing performed  Prior CABG: No previous CABG        Collier Salina [facility] 08/30/2017 9:10 AM

## 2017-08-30 NOTE — Research (Signed)
RESEARCH ENCOUNTER  Patient ID: Randy [name]  DOB: [birth year]  Randy [name] stated that he does NOT want to participate in the [employer] research trial.  His friend, [name], has stage 4 cancer and he feels that the visits are too frequent on top of her treatment.  In addition, [name] provides Randy [name]' transportation.  The [employer] trial will provide transportation services for Randy [name], but he still declined.  Contact information given to subject in case he wishes to participate in the future.

## 2017-08-30 NOTE — Progress Notes (Addendum)
PROGRESS NOTE   Randy [name]  BJS:[id number]    DOB: 06/18/1946    DOA: 08/27/2017  PCP: Lucille Passy, MD   I have briefly reviewed patients previous medical records in [facility].  Brief Narrative:  72 year old male with PMH of CAD, chronic systolic CHF (LVEF 60-73%) with AICD placement 09/2016, HTN, HLD, DM 2, stage III chronic kidney disease, dyspnea, presented to the ED 08/27/17 with acute onset of dyspnea, orthopnea and PND.  In ED, respiratory distress, hypoxic at 86%.  Placed on BiPAP.  BNP 83.5.  Chest x-ray suggestive of pulmonary edema.  AICD fired twice while playing golf 2 days PTA.  Admitted for acute on chronic combined CHF, VT with AICD firing as outpatient and NSTEMI.  Cardiology consulting.  Status post cardiac cath 2/1 showed severe three-vessel CAD. TCTS being consulted by Cardiology for possible CABG..   Assessment & Plan:   Principal Problem:   Acute exacerbation of CHF (congestive heart failure) (HCC) Active Problems:   Type 2 diabetes with nephropathy (HCC)   Essential hypertension   Non-ST elevation (NSTEMI) myocardial infarction (HCC)   Automatic implantable cardioverter-defibrillator in situ - [employer]   Acute respiratory failure with hypoxia (HCC)   Leukocytosis   1. Acute on chronic combined systolic and diastolic CHF/ischemic cardiomyopathy: TTE 1/29: LVEF 25-30%, diffuse hypokinesis and grade 2 diastolic dysfunction.  Cardiology consulted.  Diuresed with IV Lasix.  - 2.9 L.  Weight apparently below his outpatient baseline.  Creatinine increased and Lasix discontinued 1/30 in preparation for cardiac cath. No ACEI/ARB due to worsening creatinine and holding nitrates due to SBP in the 90s.  Stable. 2. Ventricular tachycardia: Had 3 episodes prior to admission and AICD shocked.  Cardiology managing.  Now on amiodarone.  No further VT noted on monitor. 3. NSTEMI/CAD: No chest pain.  Troponin peaked to 11.77.  Continue IV heparin, aspirin, beta blockers and  high-dose statins.  Cardiology follow-up appreciated.  Cardiac cath 2/1 showed severe three-vessel and left main obstructive CAD.  Cardiology consulting CT surgery for CABG. 4. Acute respiratory failure with hypoxia: Oxygen saturations noted to be 77% on room air in ED.  Briefly on BiPAP.  Treated for decompensated CHF.  Hypoxia resolved. 5. Essential hypertension: Controlled.  Continue carvedilol. 6. Acute on stage III chronic kidney disease: Baseline creatinine 1.7-1.9.  Creatinine has fluctuated in the 1.9-2.05 range in the last 3 days.  Status post cardiac cath 2/1.  Follow BMP in a.m. 7. Type II DM with renal complications: X1G on 01/22/93: 7.4.  Hold oral hypoglycemics.  SSI.  Reasonable inpatient control. 8. Normocytic anemia: Hemoglobin dropped from 12.6-10.7 in the absence of acute bleeding.  Hemoglobin stable in the high 10 g range. 9. Thrombocytopenia:?  Related to heparin.  183 > 139 > 121 >146.  Continue to follow closely.   DVT prophylaxis: Currently on IV heparin infusion. Code Status: Full Family Communication: None at bedside Disposition: To be determined   Consultants:  Cardiology CT surgery-pending.  Procedures:  LEFT HEART CATH AND CORONARY ANGIOGRAPHY  Conclusion     Prox RCA lesion is 90% stenosed.  Mid RCA lesion is 100% stenosed.  Ost LM to Mid LM lesion is 85% stenosed.  Ost LAD to Prox LAD lesion is 50% stenosed.  Ost 1st Diag to 1st Diag lesion is 70% stenosed.  Ost Ramus to Ramus lesion is 75% stenosed.  Ost Cx to Prox Cx lesion is 99% stenosed.  Ost 1st Mrg lesion is 100% stenosed.  LV end diastolic pressure is normal.   1. Severe 3 vessel and left main obstructive CAD 2. Normal LVEDP  Plan: will consult CT surgery for CABG     Antimicrobials:  None   Subjective: Seen this morning prior to procedure.  No chest pain or dyspnea.  Not able to sleep adequately due to hospital bed.  ROS: As above.  Objective:  Vitals:   08/30/17  0923 08/30/17 0928 08/30/17 0933 08/30/17 0938  BP: 106/74 113/68 (!) 102/59 102/63  Pulse: (!) 57 (!) 57 (!) 56 60  Resp: 16 14 14 13   Temp:      TempSrc:      SpO2: 99% 93% 96% 97%  Weight:      Height:        Examination:  General exam: Elderly male, moderately built and nourished, sitting up comfortably in bed this morning. Respiratory system: Clear to auscultation. Respiratory effort normal.  Stable without change. Cardiovascular system: S1 & S2 heard, RRR. No JVD, murmurs, rubs, gallops or clicks. No pedal edema.  Telemetry personally reviewed: SB in the 50s-SR in the 60s.  Stable without change. Gastrointestinal system: Abdomen is nondistended, soft and nontender. No organomegaly or masses felt. Normal bowel sounds heard.  Stable without change. Central nervous system: Alert and oriented. No focal neurological deficits.  Stable without change. Extremities: Symmetric 5 x 5 power. Skin: No rashes, lesions or ulcers Psychiatry: Judgement and insight appear normal. Mood & affect appropriate.     Data Reviewed: I have personally reviewed following labs and imaging studies  CBC: Recent Labs  Lab 08/27/17 0144 08/28/17 0247 08/29/17 0446 08/30/17 0206  WBC 10.9* 8.2 6.4 7.4  HGB 12.6* 10.7* 10.9* 11.7*  HCT 39.4 33.0* 33.5* 34.7*  MCV 89.7 88.0 87.7 87.6  PLT 183 139* 121* 937*   Basic Metabolic Panel: Recent Labs  Lab 08/27/17 0144 08/27/17 0235 08/28/17 0247 08/29/17 0446 08/30/17 0206  NA 137  --  135 136 138  K 5.1  --  5.0 4.8 5.1  CL 104  --  100* 105 104  CO2 22  --  23 20* 23  GLUCOSE 172*  --  123* 107* 112*  BUN 30*  --  37* 39* 39*  CREATININE 1.70*  --  2.05* 1.91* 2.04*  CALCIUM 9.1  --  8.8* 8.9 9.3  MG  --  2.0  --   --   --    Liver Function Tests: Recent Labs  Lab 08/28/17 0247  AST 39  ALT 15*  ALKPHOS 33*  BILITOT 0.6  PROT 6.2*  ALBUMIN 3.8   Coagulation Profile: Recent Labs  Lab 08/30/17 0206  INR 1.13   Cardiac  Enzymes: Recent Labs  Lab 08/27/17 0215 08/27/17 0357 08/27/17 1020 08/27/17 1518  TROPONINI 10.29* 7.84* 11.38* 11.77*   HbA1C: No results for input(s): HGBA1C in the last 72 hours. CBG: Recent Labs  Lab 08/29/17 0636 08/29/17 1149 08/29/17 1619 08/29/17 2038 08/30/17 0609  GLUCAP 103* 105* 108* 123* 99    No results found for this or any previous visit (from the past 240 hour(s)).       Radiology Studies: No results found.      Scheduled Meds: . amiodarone  200 mg Oral BID  . aspirin EC  81 mg Oral Daily  . atorvastatin  80 mg Oral q1800  . carvedilol  12.5 mg Oral BID WC  . gabapentin  600 mg Oral BID  . insulin aspart  0-9 Units  Subcutaneous TID WC  . ipratropium-albuterol  3 mL Nebulization TID  . polyethylene glycol  17 g Oral Daily  . senna  2 tablet Oral Daily  . sodium chloride flush  3 mL Intravenous Q12H  . sodium chloride flush  3 mL Intravenous Q12H   Continuous Infusions: . sodium chloride    . sodium chloride    . sodium chloride 1 mL/kg/hr (08/30/17 1008)     LOS: 3 days     Vernell Leep, MD, FACP, [facility]. [facility] Pager [phone]  If 7PM-7AM, please contact night-coverage [email or web address] Password [email or web address] 08/30/2017, 10:35 AM

## 2017-08-30 NOTE — Progress Notes (Signed)
ANTICOAGULATION CONSULT NOTE - Follow Up Consult  Pharmacy Consult for Heparin  Indication: chest pain/ACS  No Known Allergies  Patient Measurements: Height: 5\' 7"  (170.2 cm) Weight: 189 lb 1.6 oz (85.8 kg) IBW/kg (Calculated) : 66.1  Vital Signs: Temp: 97.8 F (36.6 C) (02/01 0805) Temp Source: Oral (02/01 0805) BP: 102/63 (02/01 0938) Pulse Rate: 60 (02/01 0938)  Labs: Recent Labs    08/27/17 1518  08/28/17 0247 08/28/17 1137 08/29/17 0446 08/30/17 0206  HGB  --    < > 10.7*  --  10.9* 11.7*  HCT  --   --  33.0*  --  33.5* 34.7*  PLT  --   --  139*  --  121* 146*  LABPROT  --   --   --   --   --  14.4  INR  --   --   --   --   --  1.13  HEPARINUNFRC  --    < > 0.21* 0.53 0.54 0.49  CREATININE  --   --  2.05*  --  1.91* 2.04*  TROPONINI 11.77*  --   --   --   --   --    < > = values in this interval not displayed.    Estimated Creatinine Clearance: 34.8 mL/min (A) (by C-G formula based on SCr of 2.04 mg/dL (H)).   Assessment: Heparin for r/o acs, unable to take to cath earlier d/t AKI. Heparin continues to be at goal on 1350 units/hr. No bleeding issues noted. Hgb up to 11 stable overnight.  Cath shows MV CAD, will resume heparin at previous rate 8 hours post sheath pull.   D/w nurse this afternoon, patient had rebleeding after starting to remove TR band so air was added back. Will delay restarting heparin until later tonight to allow for hemostasis.  Goal of Therapy:  Heparin level 0.3-0.7 units/ml Monitor platelets by anticoagulation protocol: Yes   Plan:  Resume heparin at previous rate later tonight Will defer checking labs till tomorrow morning  Erin Hearing PharmD., BCPS Clinical Pharmacist 08/30/2017 10:22 AM

## 2017-08-30 NOTE — Progress Notes (Signed)
Patient does not wish to wear CPAP at night due to being unable to remove the mask on his own.

## 2017-08-31 ENCOUNTER — Inpatient Hospital Stay (HOSPITAL_COMMUNITY): Payer: Medicare HMO

## 2017-08-31 DIAGNOSIS — Z01810 Encounter for preprocedural cardiovascular examination: Secondary | ICD-10-CM

## 2017-08-31 LAB — CBC
HCT: 35.2 % — ABNORMAL LOW (ref 39.0–52.0)
Hemoglobin: 11.3 g/dL — ABNORMAL LOW (ref 13.0–17.0)
MCH: 28.5 pg (ref 26.0–34.0)
MCHC: 32.1 g/dL (ref 30.0–36.0)
MCV: 88.7 fL (ref 78.0–100.0)
Platelets: 148 10*3/uL — ABNORMAL LOW (ref 150–400)
RBC: 3.97 MIL/uL — ABNORMAL LOW (ref 4.22–5.81)
RDW: 13.8 % (ref 11.5–15.5)
WBC: 5.8 10*3/uL (ref 4.0–10.5)

## 2017-08-31 LAB — GLUCOSE, CAPILLARY
GLUCOSE-CAPILLARY: 98 mg/dL (ref 65–99)
Glucose-Capillary: 94 mg/dL (ref 65–99)
Glucose-Capillary: 99 mg/dL (ref 65–99)
Glucose-Capillary: 119 mg/dL — ABNORMAL HIGH (ref 65–99)

## 2017-08-31 LAB — BASIC METABOLIC PANEL
Anion gap: 11 (ref 5–15)
BUN: 37 mg/dL — AB (ref 6–20)
CALCIUM: 9.3 mg/dL (ref 8.9–10.3)
CO2: 23 mmol/L (ref 22–32)
Chloride: 104 mmol/L (ref 101–111)
Creatinine, Ser: 1.97 mg/dL — ABNORMAL HIGH (ref 0.61–1.24)
GFR calc Af Amer: 38 mL/min — ABNORMAL LOW (ref >60)
GFR, EST NON AFRICAN AMERICAN: 32 mL/min — AB (ref >60)
GLUCOSE: 109 mg/dL — AB (ref 65–99)
Potassium: 4.9 mmol/L (ref 3.5–5.1)
Sodium: 138 mmol/L (ref 135–145)

## 2017-08-31 LAB — HEPARIN LEVEL (UNFRACTIONATED)
Heparin Unfractionated: 0.17 IU/mL — ABNORMAL LOW (ref 0.30–0.70)
Heparin Unfractionated: 0.43 IU/mL (ref 0.30–0.70)

## 2017-08-31 NOTE — Progress Notes (Signed)
PROGRESS NOTE    KHYE HOCHSTETLER  RWE:[id number] DOB: 20-Apr-1946 DOA: 08/27/2017 PCP: Lucille Passy, MD     Brief Narrative:  Randy [name] is a 72 year old male with PMH of CAD, chronic systolic CHF (LVEF 61-95%) with AICD placement 09/2016, HTN, HLD, DM 2, stage III chronic kidney disease, dyspnea, presented to the ED 08/27/17 with acute onset of dyspnea, orthopnea and PND.  In ED, he was in respiratory distress, hypoxic at 86%.  Placed on BiPAP.  BNP 83.5.  Chest x-ray suggestive of pulmonary edema.  AICD fired twice while playing golf 2 days PTA.  He was admitted for acute on chronic combined CHF, VT with AICD firing as outpatient and NSTEMI.  Cardiology consulted and he underwent cardiac cath 2/1 which showed severe three-vessel CAD. TCTS was consulted and he is planned for CABG on 2/4.   Assessment & Plan:   Principal Problem:   Acute exacerbation of CHF (congestive heart failure) (HCC) Active Problems:   Type 2 diabetes with nephropathy (HCC)   Essential hypertension   Non-ST elevation (NSTEMI) myocardial infarction [facility])   Automatic implantable cardioverter-defibrillator in situ - [employer]   Acute respiratory failure with hypoxia (HCC)   Leukocytosis  Acute on chronic combined systolic and diastolic CHF/ischemic cardiomyopathy: TTE 1/29: LVEF 25-30%, diffuse hypokinesis and grade 2 diastolic dysfunction.  Cardiology consulted.  Diuresed with IV Lasix. Weight apparently below his outpatient baseline.  Creatinine increased and Lasix discontinued 1/30 in preparation for cardiac cath.  No ACEI/ARB due to worsening creatinine and holding nitrates due to SBP in the 90s.  Stable.  Ventricular fib: Had 3 episodes prior to admission and AICD shocked.  Cardiology managing. Likely ischemic induced and amiodarone now discontinued. Continue telemetry monitoring.   NSTEMI/CAD: No chest pain this morning.  Troponin peaked to 11.77.  Continue IV heparin, aspirin, beta blockers and high-dose statins.   Cardiac cath 2/1 showed severe three-vessel and left main obstructive CAD.  CABG planned 2/4.   Acute respiratory failure with hypoxia: Oxygen saturations noted to be 77% on room air in ED.  Briefly on BiPAP.  Treated for decompensated CHF.  Hypoxia resolved and now stable on room air.   Essential hypertension: Controlled.  Continue carvedilol.  Acute on stage III chronic kidney disease: Baseline creatinine 1.7-1.9. Status post cardiac cath 2/1.  Monitor BMP. Stable.   Type II DM with renal complications: K9T on 2/67/12: 7.4.  Hold oral hypoglycemics.  SSI.  Reasonable inpatient control.  Normocytic anemia: Hemoglobin dropped from 12.6-10.7 in the absence of acute bleeding.  Hemoglobin stable   Mild thrombocytopenia: ?related to heparin.  183 > 139 > 121 >146 > 148.  Continue to monitor.    DVT prophylaxis: IV heparing Code Status: Full Family Communication:  No family at bedside Disposition Plan: CABG 2/4   Consultants:   Cardiology  Cardiothoracic surgery  Procedures:   Heart cath 2/1   Antimicrobials:  Anti-infectives (From admission, onward)   None       Subjective: He has no complaints this morning. He denies any chest pain or pressure. No SOB at rest. He has some shortness of breath with exertion. No edema.   Objective: Vitals:   08/30/17 2031 08/31/17 0432 08/31/17 0744 08/31/17 0756  BP:   105/69   Pulse:   (!) 106   Resp:   16   Temp:   98.7 F (37.1 C)   TempSrc:   Oral   SpO2: 95%  94% 97%  Weight:  85.4 kg (188 lb 3.2 oz)    Height:        Intake/Output Summary (Last 24 hours) at 08/31/2017 1131 Last data filed at 08/31/2017 0744 Gross per 24 hour  Intake 720 ml  Output 1000 ml  Net -280 ml   Filed Weights   08/29/17 0340 08/30/17 0257 08/31/17 0432  Weight: 87.5 kg (192 lb 14.4 oz) 85.8 kg (189 lb 1.6 oz) 85.4 kg (188 lb 3.2 oz)    Examination:  General exam: Appears calm and comfortable  Respiratory system: Clear to auscultation.  Respiratory effort normal. Cardiovascular system: S1 & S2 heard, RRR. No JVD, murmurs, rubs, gallops or clicks. No pedal edema. Gastrointestinal system: Abdomen is nondistended, soft and nontender. No organomegaly or masses felt. Normal bowel sounds heard. Central nervous system: Alert and oriented. No focal neurological deficits. Extremities: Symmetric 5 x 5 power. Skin: No rashes, lesions or ulcers Psychiatry: Judgement and insight appear normal. Mood & affect appropriate.   Data Reviewed: I have personally reviewed following labs and imaging studies  CBC: Recent Labs  Lab 08/27/17 0144 08/28/17 0247 08/29/17 0446 08/30/17 0206 08/31/17 0150  WBC 10.9* 8.2 6.4 7.4 5.8  HGB 12.6* 10.7* 10.9* 11.7* 11.3*  HCT 39.4 33.0* 33.5* 34.7* 35.2*  MCV 89.7 88.0 87.7 87.6 88.7  PLT 183 139* 121* 146* 259*   Basic Metabolic Panel: Recent Labs  Lab 08/27/17 0144 08/27/17 0235 08/28/17 0247 08/29/17 0446 08/30/17 0206 08/31/17 0150  NA 137  --  135 136 138 138  K 5.1  --  5.0 4.8 5.1 4.9  CL 104  --  100* 105 104 104  CO2 22  --  23 20* 23 23  GLUCOSE 172*  --  123* 107* 112* 109*  BUN 30*  --  37* 39* 39* 37*  CREATININE 1.70*  --  2.05* 1.91* 2.04* 1.97*  CALCIUM 9.1  --  8.8* 8.9 9.3 9.3  MG  --  2.0  --   --   --   --    GFR: Estimated Creatinine Clearance: 35.9 mL/min (A) (by C-G formula based on SCr of 1.97 mg/dL (H)). Liver Function Tests: Recent Labs  Lab 08/28/17 0247  AST 39  ALT 15*  ALKPHOS 33*  BILITOT 0.6  PROT 6.2*  ALBUMIN 3.8   No results for input(s): LIPASE, AMYLASE in the last 168 hours. No results for input(s): AMMONIA in the last 168 hours. Coagulation Profile: Recent Labs  Lab 08/30/17 0206  INR 1.13   Cardiac Enzymes: Recent Labs  Lab 08/27/17 0215 08/27/17 0357 08/27/17 1020 08/27/17 1518  TROPONINI 10.29* 7.84* 11.38* 11.77*   BNP (last 3 results) No results for input(s): PROBNP in the last 8760 hours. HbA1C: No results for  input(s): HGBA1C in the last 72 hours. CBG: Recent Labs  Lab 08/30/17 0609 08/30/17 1149 08/30/17 1635 08/30/17 2122 08/31/17 0603  GLUCAP 99 84 91 109* 94   Lipid Profile: No results for input(s): CHOL, HDL, LDLCALC, TRIG, CHOLHDL, LDLDIRECT in the last 72 hours. Thyroid Function Tests: No results for input(s): TSH, T4TOTAL, FREET4, T3FREE, THYROIDAB in the last 72 hours. Anemia Panel: No results for input(s): VITAMINB12, FOLATE, FERRITIN, TIBC, IRON, RETICCTPCT in the last 72 hours. Sepsis Labs: No results for input(s): PROCALCITON, LATICACIDVEN in the last 168 hours.  No results found for this or any previous visit (from the past 240 hour(s)).     Radiology Studies: No results found.    Scheduled Meds: . aspirin EC  81 mg Oral Daily  . atorvastatin  80 mg Oral q1800  . carvedilol  12.5 mg Oral BID WC  . gabapentin  600 mg Oral BID  . insulin aspart  0-9 Units Subcutaneous TID WC  . ipratropium-albuterol  3 mL Nebulization TID  . polyethylene glycol  17 g Oral Daily  . senna  2 tablet Oral Daily  . sodium chloride flush  3 mL Intravenous Q12H  . sodium chloride flush  3 mL Intravenous Q12H   Continuous Infusions: . sodium chloride    . sodium chloride    . heparin 1,350 Units/hr (08/30/17 2300)     LOS: 4 days    Time spent: 30 minutes   Dessa Phi, DO [facility] [email or web address] Password [facility] 08/31/2017, 11:31 AM

## 2017-08-31 NOTE — Progress Notes (Signed)
[id number] Did not walk with pt due to LM disease. Gave pt [facility] booklet, care guide and in the tube handout.  Discussed sternal precautions. Stressed importance of IS and walking after surgery. Gave IS and pt could demonstrate 1750 ml correctly. Pt stated he will have someone available 24/7 first week home.  Will follow up after surgery.Graylon Good RN BSN 08/31/2017 10:34 AM

## 2017-08-31 NOTE — Progress Notes (Signed)
ANTICOAGULATION CONSULT NOTE - Follow Up Consult  Pharmacy Consult for Heparin Indication: chest pain/ACS  No Known Allergies  Patient Measurements: Height: 5\' 7"  (170.2 cm) Weight: 188 lb 3.2 oz (85.4 kg) IBW/kg (Calculated) : 66.1  Vital Signs: Temp: 98.7 F (37.1 C) (02/02 0744) Temp Source: Oral (02/02 0744) BP: 105/69 (02/02 0744) Pulse Rate: 106 (02/02 0744)  Labs: Recent Labs    08/29/17 0446 08/30/17 0206 08/31/17 0150 08/31/17 0813  HGB 10.9* 11.7* 11.3*  --   HCT 33.5* 34.7* 35.2*  --   PLT 121* 146* 148*  --   LABPROT  --  14.4  --   --   INR  --  1.13  --   --   HEPARINUNFRC 0.54 0.49 0.17* 0.43  CREATININE 1.91* 2.04* 1.97*  --     Estimated Creatinine Clearance: 35.9 mL/min (A) (by C-G formula based on SCr of 1.97 mg/dL (H)).   Medications:  Heparin @ 1350 units/hr  Assessment: 71yom s/p cath found to have severe 3 vessel CAD and left main disease. Heparin restarted post cath pending CABG on Monday. Heparin level is therapeutic at 0.43. CBC stable. No bleeding.  Goal of Therapy:  Heparin level 0.3-0.7 units/ml Monitor platelets by anticoagulation protocol: Yes   Plan:  1) Continue heparin at 1350 units/hr 2) Daily heparin level and CBC  Deboraha Sprang 08/31/2017,10:27 AM

## 2017-08-31 NOTE — Progress Notes (Signed)
Patient stated he felt full and had not urinated sufficiently all afternoon.  Bladder scan was done revealing >900 ml in bladder. MD notified.  Order was received to in/out cath patient.  1210 ml was evacuated.  Will continue to monitor.

## 2017-08-31 NOTE — Progress Notes (Signed)
Pre-op Cardiac Surgery  Carotid Findings:  Findings suggest 1-39% right internal carotid artery stenosis and upper range 60-79% left internal carotid artery stenosis. Vertebral arteries are patent with antegrade flow.  Upper Extremity Right Left  Brachial Pressures 115-Triphasic Unable to obtain pressure due to IV location- Triphasic  Radial Waveforms Triphasic Triphasic  Ulnar Waveforms Triphasic Triphasic  Palmar Arch (Allen's Test) Signal obliterates with radial compression, decreases >50% with ulnar compression. Signal obliterates with radial compression, decreases 50% with ulnar compression.    Lower  Extremity Right Left  Dorsalis Pedis 188-Triphasic 139-Triphasic  Anterior Tibial    Posterior Tibial 143-Biphasic 122-Biphasic  Ankle/Brachial Indices 1.63 1.21    Findings:   The right ABI is elevated at rest, suggestive of possible medial calcification. The left ABI is within normal limits at rest.  08/31/2017 2:45 PM Maudry Mayhew, BS, RVT, RDCS, RDMS

## 2017-08-31 NOTE — Procedures (Signed)
Patient refused CPAP for the night.  Patient is aware to ask RN to call Respiratory if he changes his mind. 

## 2017-08-31 NOTE — Progress Notes (Signed)
Progress Note  Patient Name: Randy [name] Date of Encounter: 08/31/2017  Primary Cardiologist:   No primary care provider on file.   Subjective   Denies pain or SOB.    Inpatient Medications    Scheduled Meds: . aspirin EC  81 mg Oral Daily  . atorvastatin  80 mg Oral q1800  . carvedilol  12.5 mg Oral BID WC  . gabapentin  600 mg Oral BID  . insulin aspart  0-9 Units Subcutaneous TID WC  . ipratropium-albuterol  3 mL Nebulization TID  . polyethylene glycol  17 g Oral Daily  . senna  2 tablet Oral Daily  . sodium chloride flush  3 mL Intravenous Q12H  . sodium chloride flush  3 mL Intravenous Q12H   Continuous Infusions: . sodium chloride    . sodium chloride    . heparin 1,350 Units/hr (08/30/17 2300)   PRN Meds: sodium chloride, sodium chloride, acetaminophen, ipratropium-albuterol, ondansetron (ZOFRAN) IV, sodium chloride flush, sodium chloride flush   Vital Signs    Vitals:   08/30/17 2031 08/31/17 0432 08/31/17 0744 08/31/17 0756  BP:   105/69   Pulse:   (!) 106   Resp:   16   Temp:   98.7 F (37.1 C)   TempSrc:   Oral   SpO2: 95%  94% 97%  Weight:  188 lb 3.2 oz (85.4 kg)    Height:        Intake/Output Summary (Last 24 hours) at 08/31/2017 1133 Last data filed at 08/31/2017 0744 Gross per 24 hour  Intake 720 ml  Output 1000 ml  Net -280 ml   Filed Weights   08/29/17 0340 08/30/17 0257 08/31/17 0432  Weight: 192 lb 14.4 oz (87.5 kg) 189 lb 1.6 oz (85.8 kg) 188 lb 3.2 oz (85.4 kg)    Telemetry    NSR and atrial pacing with PVCs - Personally Reviewed  ECG    NA - Personally Reviewed  Physical Exam   GEN: No acute distress.   Neck: No  JVD Cardiac: RRR, no murmurs, rubs, or gallops.  Respiratory: Clear  to auscultation bilaterally. GI: Soft, nontender, non-distended  MS: No  edema; No deformity.  Right wrist without bruising or bleeding Neuro:  Nonfocal  Psych: Normal affect   Labs    Chemistry Recent Labs  Lab 08/28/17 0247  08/29/17 0446 08/30/17 0206 08/31/17 0150  NA 135 136 138 138  K 5.0 4.8 5.1 4.9  CL 100* 105 104 104  CO2 23 20* 23 23  GLUCOSE 123* 107* 112* 109*  BUN 37* 39* 39* 37*  CREATININE 2.05* 1.91* 2.04* 1.97*  CALCIUM 8.8* 8.9 9.3 9.3  PROT 6.2*  --   --   --   ALBUMIN 3.8  --   --   --   AST 39  --   --   --   ALT 15*  --   --   --   ALKPHOS 33*  --   --   --   BILITOT 0.6  --   --   --   GFRNONAA 31* 34* 31* 32*  GFRAA 36* 39* 36* 38*  ANIONGAP 12 11 11 11      Hematology Recent Labs  Lab 08/29/17 0446 08/30/17 0206 08/31/17 0150  WBC 6.4 7.4 5.8  RBC 3.82* 3.96* 3.97*  HGB 10.9* 11.7* 11.3*  HCT 33.5* 34.7* 35.2*  MCV 87.7 87.6 88.7  MCH 28.5 29.5 28.5  MCHC 32.5 33.7 32.1  RDW 13.5 13.4 13.8  PLT 121* 146* 148*    Cardiac Enzymes Recent Labs  Lab 08/27/17 0215 08/27/17 0357 08/27/17 1020 08/27/17 1518  TROPONINI 10.29* 7.84* 11.38* 11.77*    Recent Labs  Lab 08/27/17 0155  TROPIPOC 5.58*     BNP Recent Labs  Lab 08/27/17 0215  BNP 883.5*    Lab Results  Component Value Date   HGBA1C 7.4 (H) 04/22/2017    DDimer No results for input(s): DDIMER in the last 168 hours.   Radiology    No results found.  Cardiac Studies   CARDIAC CATH  Conclusion     Prox RCA lesion is 90% stenosed.  Mid RCA lesion is 100% stenosed.  Ost LM to Mid LM lesion is 85% stenosed.  Ost LAD to Prox LAD lesion is 50% stenosed.  Ost 1st Diag to 1st Diag lesion is 70% stenosed.  Ost Ramus to Ramus lesion is 75% stenosed.  Ost Cx to Prox Cx lesion is 99% stenosed.  Ost 1st Mrg lesion is 100% stenosed.  LV end diastolic pressure is normal.   1. Severe 3 vessel and left main obstructive CAD 2. Normal LVEDP     Patient Profile     72 y.o. male with past medical history of coronary artery disease treated medically ischemic cardiomyopathy status post ICD, chronic stage III kidney disease, diabetes mellitus, hypertension, hyperlipidemia for evaluation  of acute on chronic systolic congestive heart failure and non-ST elevation myocardial infarction. Patient was playing golf Sunday and noticed dyspnea with climbing hills which is not unusual. However his ICD fired twice. He developed severe dyspnea and presented with congestive heart failure.  He was given Lasix and also ruled in for an infarct.  Echo shows ejection fraction 87-56%, grade 2 diastolic dysfunction, mild left atrial enlargement. Cath 08/30/17 shows severe 3 vessel CAD including LM.    Assessment & Plan    ACUTE ON CHRONIC CHF:  Euvolemic.  Continue current therapy.    NSTEMI/CAD:  LM disease.  Plan is CABG on Monday.  Continue IV heparin.    ISCHEMIC CM:  As above.    DM:  A1C 7.4.    CKD STAGE III:  Creat is unchanged post cath.    For questions or updates, please contact [place] Please consult [email or web address] for contact info under Cardiology/STEMI.   Signed, Minus Breeding, MD  08/31/2017, 11:33 AM

## 2017-09-01 LAB — CBC
HEMATOCRIT: 34.3 % — AB (ref 39.0–52.0)
Hemoglobin: 11.4 g/dL — ABNORMAL LOW (ref 13.0–17.0)
MCH: 29.3 pg (ref 26.0–34.0)
MCHC: 33.2 g/dL (ref 30.0–36.0)
MCV: 88.2 fL (ref 78.0–100.0)
Platelets: 147 10*3/uL — ABNORMAL LOW (ref 150–400)
RBC: 3.89 MIL/uL — ABNORMAL LOW (ref 4.22–5.81)
RDW: 14 % (ref 11.5–15.5)
WBC: 5.6 10*3/uL (ref 4.0–10.5)

## 2017-09-01 LAB — BASIC METABOLIC PANEL
Anion gap: 10 (ref 5–15)
BUN: 38 mg/dL — AB (ref 6–20)
CALCIUM: 9.1 mg/dL (ref 8.9–10.3)
CHLORIDE: 106 mmol/L (ref 101–111)
CO2: 22 mmol/L (ref 22–32)
CREATININE: 1.79 mg/dL — AB (ref 0.61–1.24)
GFR, EST AFRICAN AMERICAN: 42 mL/min — AB (ref >60)
GFR, EST NON AFRICAN AMERICAN: 36 mL/min — AB (ref >60)
Glucose, Bld: 96 mg/dL (ref 65–99)
Potassium: 5 mmol/L (ref 3.5–5.1)
SODIUM: 138 mmol/L (ref 135–145)

## 2017-09-01 LAB — GLUCOSE, CAPILLARY
GLUCOSE-CAPILLARY: 100 mg/dL — AB (ref 65–99)
GLUCOSE-CAPILLARY: 103 mg/dL — AB (ref 65–99)
Glucose-Capillary: 91 mg/dL (ref 65–99)
Glucose-Capillary: 86 mg/dL (ref 65–99)

## 2017-09-01 LAB — SURGICAL PCR SCREEN
MRSA, PCR: NEGATIVE
STAPHYLOCOCCUS AUREUS: NEGATIVE

## 2017-09-01 LAB — TYPE AND SCREEN
ABO/RH(D): O NEG
ANTIBODY SCREEN: NEGATIVE

## 2017-09-01 LAB — HEPARIN LEVEL (UNFRACTIONATED): HEPARIN UNFRACTIONATED: 0.49 [IU]/mL (ref 0.30–0.70)

## 2017-09-01 MED ORDER — DIAZEPAM 5 MG PO TABS
5.0000 mg | ORAL_TABLET | Freq: Once | ORAL | Status: AC
  Administered 2017-09-02: 5 mg via ORAL
  Filled 2017-09-01: qty 1

## 2017-09-01 MED ORDER — METOPROLOL TARTRATE 12.5 MG HALF TABLET
12.5000 mg | ORAL_TABLET | Freq: Once | ORAL | Status: DC
  Filled 2017-09-01: qty 1

## 2017-09-01 MED ORDER — TRANEXAMIC ACID (OHS) PUMP PRIME SOLUTION
2.0000 mg/kg | INTRAVENOUS | Status: DC
  Filled 2017-09-01: qty 1.7

## 2017-09-01 MED ORDER — DOPAMINE-DEXTROSE 3.2-5 MG/ML-% IV SOLN
0.0000 ug/kg/min | INTRAVENOUS | Status: DC
  Filled 2017-09-01: qty 250

## 2017-09-01 MED ORDER — CHLORHEXIDINE GLUCONATE 0.12 % MT SOLN
15.0000 mL | Freq: Once | OROMUCOSAL | Status: AC
  Administered 2017-09-02: 15 mL via OROMUCOSAL
  Filled 2017-09-01: qty 15

## 2017-09-01 MED ORDER — DEXTROSE 5 % IV SOLN
750.0000 mg | INTRAVENOUS | Status: DC
  Filled 2017-09-01: qty 750

## 2017-09-01 MED ORDER — SODIUM CHLORIDE 0.9 % IV SOLN
30.0000 ug/min | INTRAVENOUS | Status: AC
  Administered 2017-09-02: 50 ug/min via INTRAVENOUS
  Administered 2017-09-02: 65 ug/min via INTRAVENOUS
  Filled 2017-09-01: qty 2

## 2017-09-01 MED ORDER — VANCOMYCIN HCL 10 G IV SOLR
1500.0000 mg | INTRAVENOUS | Status: AC
  Administered 2017-09-02: 1500 mg via INTRAVENOUS
  Filled 2017-09-01: qty 1500

## 2017-09-01 MED ORDER — POTASSIUM CHLORIDE 2 MEQ/ML IV SOLN
80.0000 meq | INTRAVENOUS | Status: DC
  Filled 2017-09-01: qty 40

## 2017-09-01 MED ORDER — PLASMA-LYTE 148 IV SOLN
INTRAVENOUS | Status: AC
  Administered 2017-09-02: 500 mL
  Filled 2017-09-01: qty 2.5

## 2017-09-01 MED ORDER — TRANEXAMIC ACID 1000 MG/10ML IV SOLN
1.5000 mg/kg/h | INTRAVENOUS | Status: AC
  Administered 2017-09-02: 15 mg/kg/h via INTRAVENOUS
  Filled 2017-09-01: qty 25

## 2017-09-01 MED ORDER — NITROGLYCERIN IN D5W 200-5 MCG/ML-% IV SOLN
2.0000 ug/min | INTRAVENOUS | Status: DC
  Filled 2017-09-01: qty 250

## 2017-09-01 MED ORDER — TEMAZEPAM 7.5 MG PO CAPS
15.0000 mg | ORAL_CAPSULE | Freq: Once | ORAL | Status: AC | PRN
  Administered 2017-09-01: 15 mg via ORAL
  Filled 2017-09-01: qty 2

## 2017-09-01 MED ORDER — CHLORHEXIDINE GLUCONATE CLOTH 2 % EX PADS
6.0000 | MEDICATED_PAD | Freq: Once | CUTANEOUS | Status: AC
  Administered 2017-09-01: 6 via TOPICAL

## 2017-09-01 MED ORDER — TRANEXAMIC ACID (OHS) BOLUS VIA INFUSION
15.0000 mg/kg | INTRAVENOUS | Status: DC
  Filled 2017-09-01: qty 1278

## 2017-09-01 MED ORDER — MAGNESIUM SULFATE 50 % IJ SOLN
40.0000 meq | INTRAMUSCULAR | Status: DC
  Filled 2017-09-01: qty 9.85

## 2017-09-01 MED ORDER — DEXMEDETOMIDINE HCL IN NACL 400 MCG/100ML IV SOLN
0.1000 ug/kg/h | INTRAVENOUS | Status: AC
  Administered 2017-09-02: .3 ug/kg/h via INTRAVENOUS
  Filled 2017-09-01: qty 100

## 2017-09-01 MED ORDER — HEPARIN SODIUM (PORCINE) 1000 UNIT/ML IJ SOLN
INTRAMUSCULAR | Status: DC
  Filled 2017-09-01: qty 30

## 2017-09-01 MED ORDER — BISACODYL 5 MG PO TBEC
5.0000 mg | DELAYED_RELEASE_TABLET | Freq: Once | ORAL | Status: AC
  Administered 2017-09-01: 5 mg via ORAL
  Filled 2017-09-01: qty 1

## 2017-09-01 MED ORDER — EPINEPHRINE PF 1 MG/ML IJ SOLN
0.0000 ug/min | INTRAVENOUS | Status: DC
  Filled 2017-09-01: qty 4

## 2017-09-01 MED ORDER — CEFUROXIME SODIUM 1.5 G IV SOLR
1.5000 g | INTRAVENOUS | Status: AC
  Administered 2017-09-02: 1.5 g via INTRAVENOUS
  Administered 2017-09-02: .75 g via INTRAVENOUS
  Filled 2017-09-01: qty 1.5

## 2017-09-01 MED ORDER — SODIUM CHLORIDE 0.9 % IV SOLN
INTRAVENOUS | Status: AC
  Administered 2017-09-02: .8 [IU]/h via INTRAVENOUS
  Filled 2017-09-01: qty 1

## 2017-09-01 NOTE — Progress Notes (Signed)
PROGRESS NOTE    Randy [name]  JOA:[id number] DOB: 1946-07-29 DOA: 08/27/2017 PCP: Lucille Passy, MD     Brief Narrative:  Randy [name] is a 72 year old male with PMH of CAD, chronic systolic CHF (LVEF 60-10%) with AICD placement 09/2016, HTN, HLD, DM 2, stage III chronic kidney disease, dyspnea, presented to the ED 08/27/17 with acute onset of dyspnea, orthopnea and PND.  In ED, he was in respiratory distress, hypoxic at 86%.  Placed on BiPAP.  BNP 83.5.  Chest x-ray suggestive of pulmonary edema.  AICD fired twice while playing golf 2 days PTA.  He was admitted for acute on chronic combined CHF, VT with AICD firing as outpatient and NSTEMI.  Cardiology consulted and he underwent cardiac cath 2/1 which showed severe three-vessel CAD. TCTS was consulted and he is planned for CABG on 2/4.   Assessment & Plan:   Principal Problem:   Acute exacerbation of CHF (congestive heart failure) (HCC) Active Problems:   Type 2 diabetes with nephropathy (HCC)   Essential hypertension   Non-ST elevation (NSTEMI) myocardial infarction [facility])   Automatic implantable cardioverter-defibrillator in situ - [employer]   Acute respiratory failure with hypoxia (HCC)   Leukocytosis  Acute on chronic combined systolic and diastolic CHF/ischemic cardiomyopathy: TTE 1/29: LVEF 25-30%, diffuse hypokinesis and grade 2 diastolic dysfunction.  Cardiology consulted.  Diuresed with IV Lasix. Weight apparently below his outpatient baseline.  Creatinine increased and Lasix discontinued 1/30 in preparation for cardiac cath.  No ACEI/ARB due to worsening creatinine and holding nitrates due to SBP in the 90s.  Stable.   Ventricular fib: Had 3 episodes prior to admission and AICD shocked.  Cardiology managing. Likely ischemic induced and amiodarone now discontinued. Continue telemetry monitoring.   NSTEMI/CAD: No chest pain this morning.  Troponin peaked to 11.77.  Continue IV heparin, aspirin, beta blockers and high-dose  statins.  Cardiac cath 2/1 showed severe three-vessel and left main obstructive CAD.  CABG planned 2/4.   Acute respiratory failure with hypoxia: Oxygen saturations noted to be 77% on room air in ED.  Briefly on BiPAP.  Treated for decompensated CHF.  Hypoxia resolved and now stable on room air.   Essential hypertension: Controlled.  Continue carvedilol.  Acute on stage III chronic kidney disease: Baseline creatinine 1.7-1.9. Status post cardiac cath 2/1.  Monitor BMP. Stable.   Type II DM with renal complications: X3A on 3/55/73: 7.4.  Hold oral hypoglycemics.  SSI.  Well controlled inpatient.   Normocytic anemia: Hemoglobin dropped from 12.6-10.7 in the absence of acute bleeding.  Hemoglobin stable   Mild thrombocytopenia: ?related to heparin. Remains stable. Continue to monitor.   Acute urinary retention: >975mL bladder scan yesterday, required in and out cath x 1. Continue to monitor bladder scans prn.    DVT prophylaxis: IV heparing Code Status: Full Family Communication:  No family at bedside Disposition Plan: CABG 2/4   Consultants:   Cardiology  Cardiothoracic surgery  Procedures:   Heart cath 2/1   Antimicrobials:  Anti-infectives (From admission, onward)   Start     Dose/Rate Route Frequency Ordered Stop   09/02/17 0400  vancomycin (VANCOCIN) 1,500 mg in sodium chloride 0.9 % 250 mL IVPB     1,500 mg 125 mL/hr over 120 Minutes Intravenous To Surgery 09/01/17 0841 09/03/17 0400   09/02/17 0400  cefUROXime (ZINACEF) 1.5 g in dextrose 5 % 50 mL IVPB     1.5 g 100 mL/hr over 30 Minutes Intravenous To Surgery 09/01/17  1308 09/03/17 0400   09/02/17 0400  cefUROXime (ZINACEF) 750 mg in dextrose 5 % 50 mL IVPB     750 mg 100 mL/hr over 30 Minutes Intravenous To Surgery 09/01/17 0841 09/03/17 0400       Subjective: He has no complaints this morning. Had acute urinary retention yesterday and required in and out cath.   Objective: Vitals:   09/01/17 0037 09/01/17  0332 09/01/17 0618 09/01/17 0803  BP: 113/64 (!) 121/98 113/66   Pulse: 64 60 60   Resp: 16 16 20    Temp: 97.7 F (36.5 C) 97.7 F (36.5 C) 98.1 F (36.7 C)   TempSrc: Oral Oral Oral   SpO2: 91% 99% 97% 98%  Weight:   85.2 kg (187 lb 12.8 oz)   Height:        Intake/Output Summary (Last 24 hours) at 09/01/2017 0935 Last data filed at 08/31/2017 1842 Gross per 24 hour  Intake 360 ml  Output 1660 ml  Net -1300 ml   Filed Weights   08/30/17 0257 08/31/17 0432 09/01/17 0618  Weight: 85.8 kg (189 lb 1.6 oz) 85.4 kg (188 lb 3.2 oz) 85.2 kg (187 lb 12.8 oz)    Examination:  General exam: Appears calm and comfortable  Respiratory system: Clear to auscultation. Respiratory effort normal. Cardiovascular system: S1 & S2 heard, RRR. No JVD, murmurs, rubs, gallops or clicks. No pedal edema. Gastrointestinal system: Abdomen is nondistended, soft and nontender. No organomegaly or masses felt. Normal bowel sounds heard. Central nervous system: Alert and oriented. No focal neurological deficits. Extremities: Symmetric 5 x 5 power. Skin: No rashes, lesions or ulcers Psychiatry: Judgement and insight appear normal. Mood & affect appropriate.   Data Reviewed: I have personally reviewed following labs and imaging studies  CBC: Recent Labs  Lab 08/28/17 0247 08/29/17 0446 08/30/17 0206 08/31/17 0150 09/01/17 0504  WBC 8.2 6.4 7.4 5.8 5.6  HGB 10.7* 10.9* 11.7* 11.3* 11.4*  HCT 33.0* 33.5* 34.7* 35.2* 34.3*  MCV 88.0 87.7 87.6 88.7 88.2  PLT 139* 121* 146* 148* 657*   Basic Metabolic Panel: Recent Labs  Lab 08/27/17 0235 08/28/17 0247 08/29/17 0446 08/30/17 0206 08/31/17 0150 09/01/17 0504  NA  --  135 136 138 138 138  K  --  5.0 4.8 5.1 4.9 5.0  CL  --  100* 105 104 104 106  CO2  --  23 20* 23 23 22   GLUCOSE  --  123* 107* 112* 109* 96  BUN  --  37* 39* 39* 37* 38*  CREATININE  --  2.05* 1.91* 2.04* 1.97* 1.79*  CALCIUM  --  8.8* 8.9 9.3 9.3 9.1  MG 2.0  --   --   --   --    --    GFR: Estimated Creatinine Clearance: 39.5 mL/min (A) (by C-G formula based on SCr of 1.79 mg/dL (H)). Liver Function Tests: Recent Labs  Lab 08/28/17 0247  AST 39  ALT 15*  ALKPHOS 33*  BILITOT 0.6  PROT 6.2*  ALBUMIN 3.8   No results for input(s): LIPASE, AMYLASE in the last 168 hours. No results for input(s): AMMONIA in the last 168 hours. Coagulation Profile: Recent Labs  Lab 08/30/17 0206  INR 1.13   Cardiac Enzymes: Recent Labs  Lab 08/27/17 0215 08/27/17 0357 08/27/17 1020 08/27/17 1518  TROPONINI 10.29* 7.84* 11.38* 11.77*   BNP (last 3 results) No results for input(s): PROBNP in the last 8760 hours. HbA1C: No results for input(s): HGBA1C in the  last 72 hours. CBG: Recent Labs  Lab 08/31/17 0603 08/31/17 1208 08/31/17 1625 08/31/17 2120 09/01/17 0626  GLUCAP 94 98 99 119* 100*   Lipid Profile: No results for input(s): CHOL, HDL, LDLCALC, TRIG, CHOLHDL, LDLDIRECT in the last 72 hours. Thyroid Function Tests: No results for input(s): TSH, T4TOTAL, FREET4, T3FREE, THYROIDAB in the last 72 hours. Anemia Panel: No results for input(s): VITAMINB12, FOLATE, FERRITIN, TIBC, IRON, RETICCTPCT in the last 72 hours. Sepsis Labs: No results for input(s): PROCALCITON, LATICACIDVEN in the last 168 hours.  No results found for this or any previous visit (from the past 240 hour(s)).     Radiology Studies: No results found.    Scheduled Meds: . aspirin EC  81 mg Oral Daily  . atorvastatin  80 mg Oral q1800  . carvedilol  12.5 mg Oral BID WC  . gabapentin  600 mg Oral BID  . [START ON 09/02/2017] heparin-papaverine-plasmalyte irrigation   Irrigation To OR  . insulin aspart  0-9 Units Subcutaneous TID WC  . ipratropium-albuterol  3 mL Nebulization TID  . [START ON 09/02/2017] magnesium sulfate  40 mEq Other To OR  . polyethylene glycol  17 g Oral Daily  . [START ON 09/02/2017] potassium chloride  80 mEq Other To OR  . senna  2 tablet Oral Daily  . sodium  chloride flush  3 mL Intravenous Q12H  . sodium chloride flush  3 mL Intravenous Q12H  . [START ON 09/02/2017] tranexamic acid  15 mg/kg Intravenous To OR  . [START ON 09/02/2017] tranexamic acid  2 mg/kg Intracatheter To OR   Continuous Infusions: . sodium chloride    . sodium chloride    . [START ON 09/02/2017] cefUROXime (ZINACEF)  IV    . [START ON 09/02/2017] cefUROXime (ZINACEF)  IV    . [START ON 09/02/2017] dexmedetomidine    . [START ON 09/02/2017] DOPamine    . [START ON 09/02/2017] epinephrine    . [START ON 09/02/2017] heparin 30,000 units/NS 1000 mL solution for CELLSAVER    . heparin 1,350 Units/hr (08/31/17 2145)  . [START ON 09/02/2017] insulin (NOVOLIN-R) infusion    . [START ON 09/02/2017] nitroGLYCERIN    . [START ON 09/02/2017] phenylephrine 20mg /253mL NS (0.08mg /ml) infusion    . [START ON 09/02/2017] tranexamic acid (CYKLOKAPRON) infusion ([facility])    . [START ON 09/02/2017] vancomycin       LOS: 5 days    Time spent: 30 minutes   Dessa Phi, DO [facility] [email or web address] Password [email or web address] 09/01/2017, 9:35 AM

## 2017-09-01 NOTE — Telephone Encounter (Signed)
followup as scheduled.  GT 

## 2017-09-01 NOTE — Progress Notes (Signed)
Progress Note  Patient Name: Randy [name]  Date of Encounter: 09/01/2017  Primary Cardiologist:   No primary care provider on file.   Subjective   Urinary retention noted.  Denies chest pain or SOB.   Inpatient Medications    Scheduled Meds: . aspirin EC  81 mg Oral Daily  . atorvastatin  80 mg Oral q1800  . carvedilol  12.5 mg Oral BID WC  . gabapentin  600 mg Oral BID  . [START ON 09/02/2017] heparin-papaverine-plasmalyte irrigation   Irrigation To OR  . insulin aspart  0-9 Units Subcutaneous TID WC  . ipratropium-albuterol  3 mL Nebulization TID  . [START ON 09/02/2017] magnesium sulfate  40 mEq Other To OR  . polyethylene glycol  17 g Oral Daily  . [START ON 09/02/2017] potassium chloride  80 mEq Other To OR  . senna  2 tablet Oral Daily  . sodium chloride flush  3 mL Intravenous Q12H  . sodium chloride flush  3 mL Intravenous Q12H  . [START ON 09/02/2017] tranexamic acid  15 mg/kg Intravenous To OR  . [START ON 09/02/2017] tranexamic acid  2 mg/kg Intracatheter To OR   Continuous Infusions: . sodium chloride    . sodium chloride    . [START ON 09/02/2017] cefUROXime (ZINACEF)  IV    . [START ON 09/02/2017] cefUROXime (ZINACEF)  IV    . [START ON 09/02/2017] dexmedetomidine    . [START ON 09/02/2017] DOPamine    . [START ON 09/02/2017] epinephrine    . [START ON 09/02/2017] heparin 30,000 units/NS 1000 mL solution for CELLSAVER    . heparin 1,350 Units/hr (08/31/17 2145)  . [START ON 09/02/2017] insulin (NOVOLIN-R) infusion    . [START ON 09/02/2017] nitroGLYCERIN    . [START ON 09/02/2017] phenylephrine 20mg /264mL NS (0.08mg /ml) infusion    . [START ON 09/02/2017] tranexamic acid (CYKLOKAPRON) infusion ([facility])    . [START ON 09/02/2017] vancomycin     PRN Meds: sodium chloride, sodium chloride, acetaminophen, ipratropium-albuterol, ondansetron (ZOFRAN) IV, sodium chloride flush, sodium chloride flush   Vital Signs    Vitals:   09/01/17 0332 09/01/17 0618 09/01/17 0803 09/01/17 1146    BP: (!) 121/98 113/66  (!) 111/97  Pulse: 60 60  (!) 56  Resp: 16 20  (!) 25  Temp: 97.7 F (36.5 C) 98.1 F (36.7 C)  98 F (36.7 C)  TempSrc: Oral Oral  Oral  SpO2: 99% 97% 98% 94%  Weight:  187 lb 12.8 oz (85.2 kg)    Height:        Intake/Output Summary (Last 24 hours) at 09/01/2017 1153 Last data filed at 08/31/2017 1842 Gross per 24 hour  Intake 360 ml  Output 1660 ml  Net -1300 ml   Filed Weights   08/30/17 0257 08/31/17 0432 09/01/17 0618  Weight: 189 lb 1.6 oz (85.8 kg) 188 lb 3.2 oz (85.4 kg) 187 lb 12.8 oz (85.2 kg)    Telemetry    NSR, atrial pacing, PVCs  - Personally Reviewed  ECG    NA - Personally Reviewed  Physical Exam   GEN: No  acute distress.   Neck: No  JVD Cardiac: RRR, no murmurs, rubs, or gallops.  Respiratory:   Decreased breath sounds GI: Soft, nontender, non-distended, normal bowel sounds  MS:  No edema; No deformity. Neuro:   Nonfocal  Psych: Oriented and appropriate   Labs    Chemistry Recent Labs  Lab 08/28/17 0247  08/30/17 0206 08/31/17 0150 09/01/17 8295  NA 135   < > 138 138 138  K 5.0   < > 5.1 4.9 5.0  CL 100*   < > 104 104 106  CO2 23   < > 23 23 22   GLUCOSE 123*   < > 112* 109* 96  BUN 37*   < > 39* 37* 38*  CREATININE 2.05*   < > 2.04* 1.97* 1.79*  CALCIUM 8.8*   < > 9.3 9.3 9.1  PROT 6.2*  --   --   --   --   ALBUMIN 3.8  --   --   --   --   AST 39  --   --   --   --   ALT 15*  --   --   --   --   ALKPHOS 33*  --   --   --   --   BILITOT 0.6  --   --   --   --   GFRNONAA 31*   < > 31* 32* 36*  GFRAA 36*   < > 36* 38* 42*  ANIONGAP 12   < > 11 11 10    < > = values in this interval not displayed.     Hematology Recent Labs  Lab 08/30/17 0206 08/31/17 0150 09/01/17 0504  WBC 7.4 5.8 5.6  RBC 3.96* 3.97* 3.89*  HGB 11.7* 11.3* 11.4*  HCT 34.7* 35.2* 34.3*  MCV 87.6 88.7 88.2  MCH 29.5 28.5 29.3  MCHC 33.7 32.1 33.2  RDW 13.4 13.8 14.0  PLT 146* 148* 147*    Cardiac Enzymes Recent Labs  Lab  08/27/17 0215 08/27/17 0357 08/27/17 1020 08/27/17 1518  TROPONINI 10.29* 7.84* 11.38* 11.77*    Recent Labs  Lab 08/27/17 0155  TROPIPOC 5.58*     BNP Recent Labs  Lab 08/27/17 0215  BNP 883.5*    Lab Results  Component Value Date   HGBA1C 7.4 (H) 04/22/2017    DDimer No results for input(s): DDIMER in the last 168 hours.   Radiology    No results found.  Cardiac Studies   CARDIAC CATH  Conclusion     Prox RCA lesion is 90% stenosed.  Mid RCA lesion is 100% stenosed.  Ost LM to Mid LM lesion is 85% stenosed.  Ost LAD to Prox LAD lesion is 50% stenosed.  Ost 1st Diag to 1st Diag lesion is 70% stenosed.  Ost Ramus to Ramus lesion is 75% stenosed.  Ost Cx to Prox Cx lesion is 99% stenosed.  Ost 1st Mrg lesion is 100% stenosed.  LV end diastolic pressure is normal.   1. Severe 3 vessel and left main obstructive CAD 2. Normal LVEDP     Patient Profile     72 y.o. male with past medical history of coronary artery disease treated medically ischemic cardiomyopathy status post ICD, chronic stage III kidney disease, diabetes mellitus, hypertension, hyperlipidemia for evaluation of acute on chronic systolic congestive heart failure and non-ST elevation myocardial infarction. Patient was playing golf Sunday and noticed dyspnea with climbing hills which is not unusual. However his ICD fired twice. He developed severe dyspnea and presented with congestive heart failure.  He was given Lasix and also ruled in for an infarct.  Echo shows ejection fraction 16-96%, grade 2 diastolic dysfunction, mild left atrial enlargement. Cath 08/30/17 shows severe 3 vessel CAD including LM.    Assessment & Plan    ACUTE ON CHRONIC CHF:  Euvolemic.  Continue current therapy.  NSTEMI/CAD:  LM disease.  Plan is CABG on Monday.  Work up in progress.   DM:  A1C 7.4.   Per primary team.   CKD STAGE III:  Creat continues to improve.   PVD:  Left carotid 60 - 79%.  Discussed  with the patient.  We will follow as an outpatient.    For questions or updates, please contact [place] Please consult [email or web address] for contact info under Cardiology/STEMI.   Signed, Minus Breeding, MD  09/01/2017, 11:53 AM

## 2017-09-01 NOTE — Anesthesia Preprocedure Evaluation (Addendum)
Anesthesia Evaluation  Patient identified by MRN, date of birth, ID band Patient awake    Reviewed: Allergy & Precautions, NPO status , Patient's Chart, lab work & pertinent test results, reviewed documented beta blocker date and time   History of Anesthesia Complications Negative for: history of anesthetic complications  Airway Mallampati: II  TM Distance: >3 FB Neck ROM: Full    Dental no notable dental hx. (+) Dental Advisory Given   Pulmonary neg pulmonary ROS, sleep apnea , former smoker,    Pulmonary exam normal        Cardiovascular hypertension, Pt. on home beta blockers + CAD, + Past MI and +CHF  Normal cardiovascular exam+ pacemaker + Cardiac Defibrillator   However his ICD fired twice. He developed severe dyspnea and presented with congestive heart failure. He was given Lasix and also ruled in for an infarct. Echo shows ejection fraction 09-23%, grade 2 diastolic dysfunction, mild left atrial enlargement. Cath 08/30/17 shows severe 3 vessel CAD including LM.      Neuro/Psych negative neurological ROS     GI/Hepatic Neg liver ROS, GERD  ,  Endo/Other  negative endocrine ROSdiabetes  Renal/GU Renal InsufficiencyRenal disease     Musculoskeletal negative musculoskeletal ROS (+)   Abdominal   Peds  Hematology negative hematology ROS (+)   Anesthesia Other Findings Day of surgery medications reviewed with the patient.  Reproductive/Obstetrics                           Anesthesia Physical Anesthesia Plan  ASA: IV  Anesthesia Plan: General   Post-op Pain Management:    Induction: Intravenous  PONV Risk Score and Plan: 2 and Ondansetron and Dexamethasone  Airway Management Planned: Oral ETT  Additional Equipment: Arterial line, PA Cath, 3D TEE and Ultrasound Guidance Line Placement  Intra-op Plan:   Post-operative Plan: Post-operative intubation/ventilation  Informed  Consent: I have reviewed the patients History and Physical, chart, labs and discussed the procedure including the risks, benefits and alternatives for the proposed anesthesia with the patient or authorized representative who has indicated his/her understanding and acceptance.   Dental advisory given  Plan Discussed with: CRNA, Anesthesiologist and Surgeon  Anesthesia Plan Comments:        Anesthesia Quick Evaluation

## 2017-09-01 NOTE — Progress Notes (Signed)
2 Days Post-Op Procedure(s) (LRB): LEFT HEART CATH AND CORONARY ANGIOGRAPHY (N/A) Subjective: No chest pain or SOB  Objective: Vital signs in last 24 hours: Temp:  [97.6 F (36.4 C)-98.1 F (36.7 C)] 98 F (36.7 C) (02/03 1146) Pulse Rate:  [56-64] 56 (02/03 1146) Cardiac Rhythm: Normal sinus rhythm;Other (Comment) (02/03 0700) Resp:  [16-25] 25 (02/03 1146) BP: (100-121)/(59-98) 111/97 (02/03 1146) SpO2:  [91 %-100 %] 94 % (02/03 1146) Weight:  [85.2 kg (187 lb 12.8 oz)] 85.2 kg (187 lb 12.8 oz) (02/03 0618)  Hemodynamic parameters for last 24 hours:    Intake/Output from previous day: 02/02 0701 - 02/03 0700 In: 720 [P.O.:720] Out: 1660 [Urine:1660] Intake/Output this shift: No intake/output data recorded.  General appearance: alert and cooperative Heart: regular rate and rhythm, S1, S2 normal, no murmur, click, rub or gallop Lungs: clear to auscultation bilaterally  Lab Results: Recent Labs    08/31/17 0150 09/01/17 0504  WBC 5.8 5.6  HGB 11.3* 11.4*  HCT 35.2* 34.3*  PLT 148* 147*   BMET:  Recent Labs    08/31/17 0150 09/01/17 0504  NA 138 138  K 4.9 5.0  CL 104 106  CO2 23 22  GLUCOSE 109* 96  BUN 37* 38*  CREATININE 1.97* 1.79*  CALCIUM 9.3 9.1    PT/INR:  Recent Labs    08/30/17 0206  LABPROT 14.4  INR 1.13   ABG    Component Value Date/Time   PHART 7.234 (L) 08/27/2017 0204   HCO3 19.3 (L) 08/27/2017 0204   TCO2 21 (L) 08/27/2017 0204   ACIDBASEDEF 8.0 (H) 08/27/2017 0204   O2SAT 95.0 08/27/2017 0204   CBG (last 3)  Recent Labs    08/31/17 2120 09/01/17 0626 09/01/17 1143  GLUCAP 119* 100* 103*    Assessment/Plan:  Severe multi-vessel CAD. Plan CABG in am. His carotid dopplers showed 49-67% LICA stenosis that will require followup study in 6 months.  I discussed the operative procedure with the patient and his wife including alternatives, benefits and risks; including but not limited to bleeding, blood transfusion,  infection, stroke, myocardial infarction, graft failure, heart block requiring a permanent pacemaker, organ dysfunction, and death.  Randy [name] understands and agrees to proceed.   LOS: 5 days    Randy [name] 09/01/2017

## 2017-09-01 NOTE — Progress Notes (Signed)
ANTICOAGULATION CONSULT NOTE - Follow Up Consult  Pharmacy Consult for Heparin Indication: chest pain/ACS  No Known Allergies  Patient Measurements: Height: 5\' 7"  (170.2 cm) Weight: 187 lb 12.8 oz (85.2 kg) IBW/kg (Calculated) : 66.1  Vital Signs: Temp: 98.1 F (36.7 C) (02/03 0618) Temp Source: Oral (02/03 0618) BP: 113/66 (02/03 0618) Pulse Rate: 60 (02/03 0618)  Labs: Recent Labs    08/30/17 0206 08/31/17 0150 08/31/17 0813 09/01/17 0504  HGB 11.7* 11.3*  --  11.4*  HCT 34.7* 35.2*  --  34.3*  PLT 146* 148*  --  147*  LABPROT 14.4  --   --   --   INR 1.13  --   --   --   HEPARINUNFRC 0.49 0.17* 0.43 0.49  CREATININE 2.04* 1.97*  --  1.79*    Estimated Creatinine Clearance: 39.5 mL/min (A) (by C-G formula based on SCr of 1.79 mg/dL (H)).   Medications:  Heparin @ 1350 units/hr  Assessment: 71yom s/p cath found to have severe 3 vessel CAD and left main disease. Heparin restarted post cath pending CABG on Monday. Heparin level is therapeutic at 0.49. CBC stable. No bleeding.  Goal of Therapy:  Heparin level 0.3-0.7 units/ml Monitor platelets by anticoagulation protocol: Yes   Plan:  1) Continue heparin at 1350 units/hr 2) Daily heparin level and CBC  Deboraha Sprang 09/01/2017,11:19 AM

## 2017-09-02 ENCOUNTER — Inpatient Hospital Stay (HOSPITAL_COMMUNITY)
Admission: EM | Disposition: A | Discharge status: Home / Self Care | Payer: Self-pay | Source: Home / Self Care | Attending: Surgery

## 2017-09-02 ENCOUNTER — Encounter (HOSPITAL_COMMUNITY): Payer: Self-pay | Admitting: Cardiology

## 2017-09-02 ENCOUNTER — Inpatient Hospital Stay (HOSPITAL_COMMUNITY): Payer: Medicare HMO

## 2017-09-02 ENCOUNTER — Inpatient Hospital Stay (HOSPITAL_COMMUNITY): Payer: Medicare HMO | Admitting: Certified Registered Nurse Anesthetist

## 2017-09-02 ENCOUNTER — Inpatient Hospital Stay (HOSPITAL_COMMUNITY): Payer: Medicare HMO | Admitting: Anesthesiology

## 2017-09-02 DIAGNOSIS — Z951 Presence of aortocoronary bypass graft: Secondary | ICD-10-CM

## 2017-09-02 HISTORY — PX: CORONARY ARTERY BYPASS GRAFT: SHX141

## 2017-09-02 HISTORY — PX: TEE WITHOUT CARDIOVERSION: SHX5443

## 2017-09-02 LAB — POCT I-STAT, CHEM 8
BUN: 36 mg/dL — ABNORMAL HIGH (ref 6–20)
BUN: 32 mg/dL — AB (ref 6–20)
BUN: 27 mg/dL — ABNORMAL HIGH (ref 6–20)
BUN: 29 mg/dL — ABNORMAL HIGH (ref 6–20)
BUN: 31 mg/dL — AB (ref 6–20)
BUN: 29 mg/dL — ABNORMAL HIGH (ref 6–20)
CALCIUM ION: 1.25 mmol/L (ref 1.15–1.40)
CALCIUM ION: 0.94 mmol/L — AB (ref 1.15–1.40)
CHLORIDE: 106 mmol/L (ref 101–111)
CHLORIDE: 107 mmol/L (ref 101–111)
CHLORIDE: 103 mmol/L (ref 101–111)
CHLORIDE: 107 mmol/L (ref 101–111)
CREATININE: 1.4 mg/dL — AB (ref 0.61–1.24)
Calcium, Ion: 1.25 mmol/L (ref 1.15–1.40)
Calcium, Ion: 1.05 mmol/L — ABNORMAL LOW (ref 1.15–1.40)
Calcium, Ion: 1.12 mmol/L — ABNORMAL LOW (ref 1.15–1.40)
Calcium, Ion: 1.15 mmol/L (ref 1.15–1.40)
Chloride: 104 mmol/L (ref 101–111)
Chloride: 106 mmol/L (ref 101–111)
Creatinine, Ser: 1.4 mg/dL — ABNORMAL HIGH (ref 0.61–1.24)
Creatinine, Ser: 1.1 mg/dL (ref 0.61–1.24)
Creatinine, Ser: 1.2 mg/dL (ref 0.61–1.24)
Creatinine, Ser: 1.3 mg/dL — ABNORMAL HIGH (ref 0.61–1.24)
Creatinine, Ser: 1.5 mg/dL — ABNORMAL HIGH (ref 0.61–1.24)
GLUCOSE: 98 mg/dL (ref 65–99)
GLUCOSE: 98 mg/dL (ref 65–99)
Glucose, Bld: 82 mg/dL (ref 65–99)
Glucose, Bld: 107 mg/dL — ABNORMAL HIGH (ref 65–99)
Glucose, Bld: 123 mg/dL — ABNORMAL HIGH (ref 65–99)
Glucose, Bld: 163 mg/dL — ABNORMAL HIGH (ref 65–99)
HCT: 33 % — ABNORMAL LOW (ref 39.0–52.0)
HCT: 30 % — ABNORMAL LOW (ref 39.0–52.0)
HCT: 25 % — ABNORMAL LOW (ref 39.0–52.0)
HEMATOCRIT: 26 % — AB (ref 39.0–52.0)
HEMATOCRIT: 26 % — AB (ref 39.0–52.0)
HEMATOCRIT: 28 % — AB (ref 39.0–52.0)
HEMOGLOBIN: 8.5 g/dL — AB (ref 13.0–17.0)
HEMOGLOBIN: 8.8 g/dL — AB (ref 13.0–17.0)
Hemoglobin: 11.2 g/dL — ABNORMAL LOW (ref 13.0–17.0)
Hemoglobin: 10.2 g/dL — ABNORMAL LOW (ref 13.0–17.0)
Hemoglobin: 8.8 g/dL — ABNORMAL LOW (ref 13.0–17.0)
Hemoglobin: 9.5 g/dL — ABNORMAL LOW (ref 13.0–17.0)
POTASSIUM: 4.6 mmol/L (ref 3.5–5.1)
POTASSIUM: 4.7 mmol/L (ref 3.5–5.1)
POTASSIUM: 5.5 mmol/L — AB (ref 3.5–5.1)
POTASSIUM: 6.2 mmol/L — AB (ref 3.5–5.1)
POTASSIUM: 5.1 mmol/L (ref 3.5–5.1)
Potassium: 4.8 mmol/L (ref 3.5–5.1)
SODIUM: 140 mmol/L (ref 135–145)
SODIUM: 135 mmol/L (ref 135–145)
SODIUM: 137 mmol/L (ref 135–145)
SODIUM: 140 mmol/L (ref 135–145)
Sodium: 139 mmol/L (ref 135–145)
Sodium: 139 mmol/L (ref 135–145)
TCO2: 20 mmol/L — ABNORMAL LOW (ref 22–32)
TCO2: 23 mmol/L (ref 22–32)
TCO2: 26 mmol/L (ref 22–32)
TCO2: 25 mmol/L (ref 22–32)
TCO2: 25 mmol/L (ref 22–32)
TCO2: 22 mmol/L (ref 22–32)

## 2017-09-02 LAB — GLUCOSE, CAPILLARY
GLUCOSE-CAPILLARY: 138 mg/dL — AB (ref 65–99)
GLUCOSE-CAPILLARY: 149 mg/dL — AB (ref 65–99)
GLUCOSE-CAPILLARY: 142 mg/dL — AB (ref 65–99)
GLUCOSE-CAPILLARY: 172 mg/dL — AB (ref 65–99)
GLUCOSE-CAPILLARY: 155 mg/dL — AB (ref 65–99)
GLUCOSE-CAPILLARY: 158 mg/dL — AB (ref 65–99)
Glucose-Capillary: 84 mg/dL (ref 65–99)
Glucose-Capillary: 112 mg/dL — ABNORMAL HIGH (ref 65–99)
Glucose-Capillary: 132 mg/dL — ABNORMAL HIGH (ref 65–99)
Glucose-Capillary: 133 mg/dL — ABNORMAL HIGH (ref 65–99)
Glucose-Capillary: 116 mg/dL — ABNORMAL HIGH (ref 65–99)
Glucose-Capillary: 98 mg/dL (ref 65–99)

## 2017-09-02 LAB — CBC
HCT: 29.9 % — ABNORMAL LOW (ref 39.0–52.0)
HEMATOCRIT: 34.7 % — AB (ref 39.0–52.0)
HEMATOCRIT: 28.5 % — AB (ref 39.0–52.0)
HEMOGLOBIN: 11.5 g/dL — AB (ref 13.0–17.0)
HEMOGLOBIN: 9.2 g/dL — AB (ref 13.0–17.0)
Hemoglobin: 9.6 g/dL — ABNORMAL LOW (ref 13.0–17.0)
MCH: 29.3 pg (ref 26.0–34.0)
MCH: 28.5 pg (ref 26.0–34.0)
MCH: 28.1 pg (ref 26.0–34.0)
MCHC: 33.1 g/dL (ref 30.0–36.0)
MCHC: 32.3 g/dL (ref 30.0–36.0)
MCHC: 32.1 g/dL (ref 30.0–36.0)
MCV: 88.5 fL (ref 78.0–100.0)
MCV: 88.2 fL (ref 78.0–100.0)
MCV: 87.4 fL (ref 78.0–100.0)
PLATELETS: 121 10*3/uL — AB (ref 150–400)
Platelets: 148 10*3/uL — ABNORMAL LOW (ref 150–400)
Platelets: 116 10*3/uL — ABNORMAL LOW (ref 150–400)
RBC: 3.92 MIL/uL — ABNORMAL LOW (ref 4.22–5.81)
RBC: 3.23 MIL/uL — ABNORMAL LOW (ref 4.22–5.81)
RBC: 3.42 MIL/uL — AB (ref 4.22–5.81)
RDW: 14 % (ref 11.5–15.5)
RDW: 13.7 % (ref 11.5–15.5)
RDW: 13.7 % (ref 11.5–15.5)
WBC: 7.4 10*3/uL (ref 4.0–10.5)
WBC: 13.3 10*3/uL — ABNORMAL HIGH (ref 4.0–10.5)
WBC: 12.8 10*3/uL — AB (ref 4.0–10.5)

## 2017-09-02 LAB — POCT I-STAT 3, ART BLOOD GAS (G3+)
ACID-BASE DEFICIT: 6 mmol/L — AB (ref 0.0–2.0)
ACID-BASE DEFICIT: 7 mmol/L — AB (ref 0.0–2.0)
ACID-BASE DEFICIT: 1 mmol/L (ref 0.0–2.0)
ACID-BASE DEFICIT: 4 mmol/L — AB (ref 0.0–2.0)
ACID-BASE DEFICIT: 7 mmol/L — AB (ref 0.0–2.0)
Acid-base deficit: 2 mmol/L (ref 0.0–2.0)
Acid-base deficit: 3 mmol/L — ABNORMAL HIGH (ref 0.0–2.0)
Acid-base deficit: 3 mmol/L — ABNORMAL HIGH (ref 0.0–2.0)
BICARBONATE: 21.5 mmol/L (ref 20.0–28.0)
BICARBONATE: 22.6 mmol/L (ref 20.0–28.0)
Bicarbonate: 23.3 mmol/L (ref 20.0–28.0)
Bicarbonate: 22.8 mmol/L (ref 20.0–28.0)
Bicarbonate: 23.9 mmol/L (ref 20.0–28.0)
Bicarbonate: 21.6 mmol/L (ref 20.0–28.0)
Bicarbonate: 19.1 mmol/L — ABNORMAL LOW (ref 20.0–28.0)
Bicarbonate: 22.5 mmol/L (ref 20.0–28.0)
O2 SAT: 100 %
O2 SAT: 97 %
O2 SAT: 96 %
O2 SAT: 97 %
O2 Saturation: 100 %
O2 Saturation: 100 %
O2 Saturation: 98 %
O2 Saturation: 98 %
PCO2 ART: 39.9 mmHg (ref 32.0–48.0)
PCO2 ART: 41.5 mmHg (ref 32.0–48.0)
PCO2 ART: 40.6 mmHg (ref 32.0–48.0)
PCO2 ART: 40.2 mmHg (ref 32.0–48.0)
PH ART: 7.376 (ref 7.350–7.450)
PH ART: 7.347 — AB (ref 7.350–7.450)
PH ART: 7.157 — AB (ref 7.350–7.450)
PH ART: 7.284 — AB (ref 7.350–7.450)
PH ART: 7.35 (ref 7.350–7.450)
PO2 ART: 321 mmHg — AB (ref 83.0–108.0)
PO2 ART: 117 mmHg — AB (ref 83.0–108.0)
PO2 ART: 98 mmHg (ref 83.0–108.0)
PO2 ART: 115 mmHg — AB (ref 83.0–108.0)
Patient temperature: 36.5
Patient temperature: 37
Patient temperature: 36.7
TCO2: 25 mmol/L (ref 22–32)
TCO2: 23 mmol/L (ref 22–32)
TCO2: 24 mmol/L (ref 22–32)
TCO2: 25 mmol/L (ref 22–32)
TCO2: 25 mmol/L (ref 22–32)
TCO2: 23 mmol/L (ref 22–32)
TCO2: 20 mmol/L — ABNORMAL LOW (ref 22–32)
TCO2: 24 mmol/L (ref 22–32)
pCO2 arterial: 54.6 mmHg — ABNORMAL HIGH (ref 32.0–48.0)
pCO2 arterial: 63.3 mmHg — ABNORMAL HIGH (ref 32.0–48.0)
pCO2 arterial: 37.8 mmHg (ref 32.0–48.0)
pCO2 arterial: 40.7 mmHg (ref 32.0–48.0)
pH, Arterial: 7.205 — ABNORMAL LOW (ref 7.350–7.450)
pH, Arterial: 7.404 (ref 7.350–7.450)
pH, Arterial: 7.333 — ABNORMAL LOW (ref 7.350–7.450)
pO2, Arterial: 446 mmHg — ABNORMAL HIGH (ref 83.0–108.0)
pO2, Arterial: 214 mmHg — ABNORMAL HIGH (ref 83.0–108.0)
pO2, Arterial: 77 mmHg — ABNORMAL LOW (ref 83.0–108.0)
pO2, Arterial: 102 mmHg (ref 83.0–108.0)

## 2017-09-02 LAB — PROTIME-INR
INR: 1.5
Prothrombin Time: 18 seconds — ABNORMAL HIGH (ref 11.4–15.2)

## 2017-09-02 LAB — ECHO TEE: ECHOEF: 40 %

## 2017-09-02 LAB — URINALYSIS, ROUTINE W REFLEX MICROSCOPIC
Bacteria, UA: NONE SEEN
Bilirubin Urine: NEGATIVE
GLUCOSE, UA: NEGATIVE mg/dL
KETONES UR: NEGATIVE mg/dL
Nitrite: NEGATIVE
Protein, ur: NEGATIVE mg/dL
Specific Gravity, Urine: 1.008 (ref 1.005–1.030)
pH: 6 (ref 5.0–8.0)

## 2017-09-02 LAB — POCT I-STAT 4, (NA,K, GLUC, HGB,HCT)
GLUCOSE: 115 mg/dL — AB (ref 65–99)
HCT: 30 % — ABNORMAL LOW (ref 39.0–52.0)
Hemoglobin: 10.2 g/dL — ABNORMAL LOW (ref 13.0–17.0)
POTASSIUM: 5 mmol/L (ref 3.5–5.1)
SODIUM: 140 mmol/L (ref 135–145)

## 2017-09-02 LAB — BASIC METABOLIC PANEL
Anion gap: 9 (ref 5–15)
BUN: 38 mg/dL — ABNORMAL HIGH (ref 6–20)
CHLORIDE: 108 mmol/L (ref 101–111)
CO2: 21 mmol/L — AB (ref 22–32)
Calcium: 8.9 mg/dL (ref 8.9–10.3)
Creatinine, Ser: 1.72 mg/dL — ABNORMAL HIGH (ref 0.61–1.24)
GFR calc non Af Amer: 38 mL/min — ABNORMAL LOW (ref >60)
GFR, EST AFRICAN AMERICAN: 44 mL/min — AB (ref >60)
Glucose, Bld: 93 mg/dL (ref 65–99)
POTASSIUM: 4.8 mmol/L (ref 3.5–5.1)
SODIUM: 138 mmol/L (ref 135–145)

## 2017-09-02 LAB — PLATELET COUNT: Platelets: 130 10*3/uL — ABNORMAL LOW (ref 150–400)

## 2017-09-02 LAB — HEMOGLOBIN A1C
Hgb A1c MFr Bld: 6.4 % — ABNORMAL HIGH (ref 4.8–5.6)
Mean Plasma Glucose: 136.98 mg/dL

## 2017-09-02 LAB — HEMOGLOBIN AND HEMATOCRIT, BLOOD
HCT: 26.9 % — ABNORMAL LOW (ref 39.0–52.0)
Hemoglobin: 9 g/dL — ABNORMAL LOW (ref 13.0–17.0)

## 2017-09-02 LAB — APTT: APTT: 35 s (ref 24–36)

## 2017-09-02 LAB — CREATININE, SERUM
Creatinine, Ser: 1.59 mg/dL — ABNORMAL HIGH (ref 0.61–1.24)
GFR calc Af Amer: 49 mL/min — ABNORMAL LOW (ref >60)
GFR calc non Af Amer: 42 mL/min — ABNORMAL LOW (ref >60)

## 2017-09-02 LAB — MAGNESIUM: Magnesium: 2.8 mg/dL — ABNORMAL HIGH (ref 1.7–2.4)

## 2017-09-02 LAB — HEPARIN LEVEL (UNFRACTIONATED): Heparin Unfractionated: 0.83 IU/mL — ABNORMAL HIGH (ref 0.30–0.70)

## 2017-09-02 SURGERY — CORONARY ARTERY BYPASS GRAFTING (CABG)
Anesthesia: General | Site: Chest

## 2017-09-02 MED ORDER — LACTATED RINGERS IV SOLN
INTRAVENOUS | Status: DC | PRN
  Administered 2017-09-02 (×2): via INTRAVENOUS

## 2017-09-02 MED ORDER — LIDOCAINE HCL (CARDIAC) 20 MG/ML IV SOLN
INTRAVENOUS | Status: DC | PRN
  Administered 2017-09-02: 100 mg via INTRAVENOUS

## 2017-09-02 MED ORDER — PANTOPRAZOLE SODIUM 40 MG PO TBEC
40.0000 mg | DELAYED_RELEASE_TABLET | Freq: Every day | ORAL | Status: DC
  Administered 2017-09-04 – 2017-09-05 (×2): 40 mg via ORAL
  Filled 2017-09-02 (×2): qty 1

## 2017-09-02 MED ORDER — METOPROLOL TARTRATE 25 MG/10 ML ORAL SUSPENSION: 12.5000 mg | Freq: Two times a day (BID) | ORAL | Status: DC

## 2017-09-02 MED ORDER — SUCCINYLCHOLINE CHLORIDE 20 MG/ML IJ SOLN
INTRAMUSCULAR | Status: DC | PRN
  Administered 2017-09-02: 80 mg via INTRAVENOUS

## 2017-09-02 MED ORDER — CHLORHEXIDINE GLUCONATE CLOTH 2 % EX PADS
6.0000 | MEDICATED_PAD | Freq: Every day | CUTANEOUS | Status: DC
  Administered 2017-09-02 – 2017-09-05 (×4): 6 via TOPICAL

## 2017-09-02 MED ORDER — THROMBIN 20000 UNITS EX SOLR
CUTANEOUS | Status: AC
  Filled 2017-09-02: qty 20000

## 2017-09-02 MED ORDER — ROCURONIUM BROMIDE 100 MG/10ML IV SOLN
INTRAVENOUS | Status: DC | PRN
  Administered 2017-09-02: 50 mg via INTRAVENOUS
  Administered 2017-09-02: 100 mg via INTRAVENOUS
  Administered 2017-09-02: 50 mg via INTRAVENOUS

## 2017-09-02 MED ORDER — 0.9 % SODIUM CHLORIDE (POUR BTL) OPTIME
TOPICAL | Status: DC | PRN
  Administered 2017-09-02: 6000 mL

## 2017-09-02 MED ORDER — METOCLOPRAMIDE HCL 5 MG/ML IJ SOLN
10.0000 mg | Freq: Four times a day (QID) | INTRAMUSCULAR | Status: AC
  Administered 2017-09-02 – 2017-09-03 (×3): 10 mg via INTRAVENOUS
  Filled 2017-09-02 (×3): qty 2

## 2017-09-02 MED ORDER — ONDANSETRON HCL 4 MG/2ML IJ SOLN
INTRAMUSCULAR | Status: DC | PRN
  Administered 2017-09-02: 4 mg via INTRAVENOUS

## 2017-09-02 MED ORDER — ASPIRIN EC 325 MG PO TBEC
325.0000 mg | DELAYED_RELEASE_TABLET | Freq: Every day | ORAL | Status: DC
  Administered 2017-09-03 – 2017-09-05 (×3): 325 mg via ORAL
  Filled 2017-09-02 (×3): qty 1

## 2017-09-02 MED ORDER — TRAMADOL HCL 50 MG PO TABS: 50.0000 mg | ORAL_TABLET | ORAL | Status: DC | PRN

## 2017-09-02 MED ORDER — FAMOTIDINE IN NACL 20-0.9 MG/50ML-% IV SOLN
20.0000 mg | Freq: Two times a day (BID) | INTRAVENOUS | Status: AC
  Administered 2017-09-02 – 2017-09-03 (×2): 20 mg via INTRAVENOUS
  Filled 2017-09-02: qty 50

## 2017-09-02 MED ORDER — LACTATED RINGERS IV SOLN: INTRAVENOUS | Status: DC

## 2017-09-02 MED ORDER — SODIUM CHLORIDE 0.9% FLUSH: 10.0000 mL | INTRAVENOUS | Status: DC | PRN

## 2017-09-02 MED ORDER — MIDAZOLAM HCL 5 MG/5ML IJ SOLN
INTRAMUSCULAR | Status: DC | PRN
  Administered 2017-09-02: 1 mg via INTRAVENOUS
  Administered 2017-09-02 (×3): 3 mg via INTRAVENOUS

## 2017-09-02 MED ORDER — SODIUM CHLORIDE 0.9% FLUSH
3.0000 mL | Freq: Two times a day (BID) | INTRAVENOUS | Status: DC
  Administered 2017-09-03 (×2): 3 mL via INTRAVENOUS
  Administered 2017-09-04: 6 mL via INTRAVENOUS
  Administered 2017-09-04: 3 mL via INTRAVENOUS
  Administered 2017-09-05: 6 mL via INTRAVENOUS

## 2017-09-02 MED ORDER — LACTATED RINGERS IV SOLN: INTRAVENOUS | Status: DC | PRN

## 2017-09-02 MED ORDER — DEXMEDETOMIDINE HCL IN NACL 200 MCG/50ML IV SOLN
0.0000 ug/kg/h | INTRAVENOUS | Status: DC
  Administered 2017-09-02: 0.4 ug/kg/h via INTRAVENOUS
  Administered 2017-09-02 – 2017-09-03 (×3): 0.7 ug/kg/h via INTRAVENOUS
  Filled 2017-09-02 (×4): qty 50

## 2017-09-02 MED ORDER — DEXAMETHASONE SODIUM PHOSPHATE 10 MG/ML IJ SOLN
INTRAMUSCULAR | Status: DC | PRN
  Administered 2017-09-02: 10 mg via INTRAVENOUS

## 2017-09-02 MED ORDER — CHLORHEXIDINE GLUCONATE 0.12 % MT SOLN
15.0000 mL | OROMUCOSAL | Status: AC
  Administered 2017-09-02: 15 mL via OROMUCOSAL

## 2017-09-02 MED ORDER — PROPOFOL 10 MG/ML IV BOLUS
INTRAVENOUS | Status: DC | PRN
  Administered 2017-09-02: 50 mg via INTRAVENOUS

## 2017-09-02 MED ORDER — POTASSIUM CHLORIDE 10 MEQ/50ML IV SOLN: 10.0000 meq | INTRAVENOUS | Status: AC

## 2017-09-02 MED ORDER — SODIUM BICARBONATE 8.4 % IV SOLN
50.0000 meq | Freq: Once | INTRAVENOUS | Status: AC
  Administered 2017-09-02: 50 meq via INTRAVENOUS
  Filled 2017-09-02: qty 50

## 2017-09-02 MED ORDER — METOPROLOL TARTRATE 5 MG/5ML IV SOLN
2.5000 mg | INTRAVENOUS | Status: DC | PRN
  Administered 2017-09-03: 5 mg via INTRAVENOUS
  Filled 2017-09-02: qty 5

## 2017-09-02 MED ORDER — LACTATED RINGERS IV SOLN: 500.0000 mL | Freq: Once | INTRAVENOUS | Status: DC | PRN

## 2017-09-02 MED ORDER — DOCUSATE SODIUM 100 MG PO CAPS
200.0000 mg | ORAL_CAPSULE | Freq: Every day | ORAL | Status: DC
  Administered 2017-09-03 – 2017-09-05 (×3): 200 mg via ORAL
  Filled 2017-09-02 (×3): qty 2

## 2017-09-02 MED ORDER — FENTANYL CITRATE (PF) 250 MCG/5ML IJ SOLN
INTRAMUSCULAR | Status: AC
  Filled 2017-09-02: qty 25

## 2017-09-02 MED ORDER — INSULIN REGULAR BOLUS VIA INFUSION
0.0000 [IU] | Freq: Three times a day (TID) | INTRAVENOUS | Status: DC
  Filled 2017-09-02: qty 10

## 2017-09-02 MED ORDER — DEXTROSE 5 % IV SOLN
1.5000 g | Freq: Two times a day (BID) | INTRAVENOUS | Status: AC
  Administered 2017-09-02 – 2017-09-04 (×4): 1.5 g via INTRAVENOUS
  Filled 2017-09-02 (×4): qty 1.5

## 2017-09-02 MED ORDER — ACETAMINOPHEN 160 MG/5ML PO SOLN
1000.0000 mg | Freq: Four times a day (QID) | ORAL | Status: DC
  Administered 2017-09-03 (×2): 1000 mg
  Filled 2017-09-02 (×2): qty 40.6

## 2017-09-02 MED ORDER — THROMBIN (RECOMBINANT) 20000 UNITS EX SOLR
OROMUCOSAL | Status: DC | PRN
  Administered 2017-09-02 (×3): 4 mL via TOPICAL

## 2017-09-02 MED ORDER — METOPROLOL TARTRATE 12.5 MG HALF TABLET: 12.5000 mg | ORAL_TABLET | Freq: Two times a day (BID) | ORAL | Status: DC

## 2017-09-02 MED ORDER — PROPOFOL 10 MG/ML IV BOLUS
INTRAVENOUS | Status: AC
  Filled 2017-09-02: qty 20

## 2017-09-02 MED ORDER — ACETAMINOPHEN 650 MG RE SUPP
650.0000 mg | Freq: Once | RECTAL | Status: AC
  Administered 2017-09-02: 650 mg via RECTAL

## 2017-09-02 MED ORDER — MAGNESIUM SULFATE 4 GM/100ML IV SOLN
4.0000 g | Freq: Once | INTRAVENOUS | Status: AC
  Administered 2017-09-02: 4 g via INTRAVENOUS
  Filled 2017-09-02: qty 100

## 2017-09-02 MED ORDER — MIDAZOLAM HCL 2 MG/2ML IJ SOLN
2.0000 mg | INTRAMUSCULAR | Status: DC | PRN
  Filled 2017-09-02: qty 2

## 2017-09-02 MED ORDER — SODIUM CHLORIDE 0.9 % IV SOLN: 250.0000 mL | INTRAVENOUS | Status: DC

## 2017-09-02 MED ORDER — HEMOSTATIC AGENTS (NO CHARGE) OPTIME
TOPICAL | Status: DC | PRN
  Administered 2017-09-02: 1 via TOPICAL

## 2017-09-02 MED ORDER — FENTANYL CITRATE (PF) 250 MCG/5ML IJ SOLN
INTRAMUSCULAR | Status: DC | PRN
  Administered 2017-09-02: 50 ug via INTRAVENOUS
  Administered 2017-09-02 (×6): 200 ug via INTRAVENOUS

## 2017-09-02 MED ORDER — ALBUMIN HUMAN 5 % IV SOLN
250.0000 mL | INTRAVENOUS | Status: AC | PRN
  Administered 2017-09-02 (×4): 250 mL via INTRAVENOUS
  Filled 2017-09-02 (×2): qty 250

## 2017-09-02 MED ORDER — EPHEDRINE SULFATE 50 MG/ML IJ SOLN
INTRAMUSCULAR | Status: DC | PRN
  Administered 2017-09-02: 10 mg via INTRAVENOUS
  Administered 2017-09-02: 20 mg via INTRAVENOUS
  Administered 2017-09-02: 10 mg via INTRAVENOUS
  Administered 2017-09-02: 20 mg via INTRAVENOUS
  Administered 2017-09-02 (×2): 10 mg via INTRAVENOUS

## 2017-09-02 MED ORDER — LACTATED RINGERS IV SOLN
INTRAVENOUS | Status: DC | PRN
  Administered 2017-09-02: 07:00:00 via INTRAVENOUS

## 2017-09-02 MED ORDER — HEPARIN SODIUM (PORCINE) 1000 UNIT/ML IJ SOLN
INTRAMUSCULAR | Status: DC | PRN
  Administered 2017-09-02: 29000 [IU] via INTRAVENOUS

## 2017-09-02 MED ORDER — BISACODYL 5 MG PO TBEC
10.0000 mg | DELAYED_RELEASE_TABLET | Freq: Every day | ORAL | Status: DC
  Administered 2017-09-03 – 2017-09-05 (×3): 10 mg via ORAL
  Filled 2017-09-02 (×3): qty 2

## 2017-09-02 MED ORDER — SODIUM CHLORIDE 0.9% FLUSH
10.0000 mL | Freq: Two times a day (BID) | INTRAVENOUS | Status: DC
  Administered 2017-09-02 – 2017-09-05 (×4): 10 mL

## 2017-09-02 MED ORDER — ACETAMINOPHEN 500 MG PO TABS
1000.0000 mg | ORAL_TABLET | Freq: Four times a day (QID) | ORAL | Status: DC
  Administered 2017-09-03 – 2017-09-05 (×9): 1000 mg via ORAL
  Filled 2017-09-02 (×8): qty 2

## 2017-09-02 MED ORDER — ALBUTEROL SULFATE HFA 108 (90 BASE) MCG/ACT IN AERS
INHALATION_SPRAY | RESPIRATORY_TRACT | Status: DC | PRN
  Administered 2017-09-02: 2 via RESPIRATORY_TRACT
  Administered 2017-09-02: 3 via RESPIRATORY_TRACT

## 2017-09-02 MED ORDER — ORAL CARE MOUTH RINSE
15.0000 mL | Freq: Four times a day (QID) | OROMUCOSAL | Status: DC
  Administered 2017-09-02: 15 mL via OROMUCOSAL

## 2017-09-02 MED ORDER — NITROGLYCERIN IN D5W 200-5 MCG/ML-% IV SOLN: 0.0000 ug/min | INTRAVENOUS | Status: DC

## 2017-09-02 MED ORDER — ASPIRIN 81 MG PO CHEW: 324.0000 mg | CHEWABLE_TABLET | Freq: Every day | ORAL | Status: DC

## 2017-09-02 MED ORDER — BISACODYL 10 MG RE SUPP: 10.0000 mg | Freq: Every day | RECTAL | Status: DC

## 2017-09-02 MED ORDER — VANCOMYCIN HCL IN DEXTROSE 1-5 GM/200ML-% IV SOLN
1000.0000 mg | Freq: Once | INTRAVENOUS | Status: AC
  Administered 2017-09-02: 1000 mg via INTRAVENOUS
  Filled 2017-09-02: qty 200

## 2017-09-02 MED ORDER — LACTATED RINGERS IV SOLN
INTRAVENOUS | Status: DC
  Administered 2017-09-02: 20 mL/h via INTRAVENOUS
  Administered 2017-09-04: 04:00:00 via INTRAVENOUS

## 2017-09-02 MED ORDER — ACETAMINOPHEN 160 MG/5ML PO SOLN: 650.0000 mg | Freq: Once | ORAL | Status: AC

## 2017-09-02 MED ORDER — PHENYLEPHRINE HCL 10 MG/ML IJ SOLN
INTRAVENOUS | Status: DC | PRN
  Administered 2017-09-02: 50 ug/min via INTRAVENOUS

## 2017-09-02 MED ORDER — SODIUM BICARBONATE 8.4 % IV SOLN: 100.0000 meq | Freq: Once | INTRAVENOUS | Status: AC

## 2017-09-02 MED ORDER — MORPHINE SULFATE (PF) 4 MG/ML IV SOLN: 1.0000 mg | INTRAVENOUS | Status: AC | PRN

## 2017-09-02 MED ORDER — CHLORHEXIDINE GLUCONATE 0.12% ORAL RINSE (MEDLINE KIT): 15.0000 mL | Freq: Two times a day (BID) | OROMUCOSAL | Status: DC

## 2017-09-02 MED ORDER — PROTAMINE SULFATE 10 MG/ML IV SOLN
INTRAVENOUS | Status: DC | PRN
  Administered 2017-09-02 (×6): 50 mg via INTRAVENOUS

## 2017-09-02 MED ORDER — ONDANSETRON HCL 4 MG/2ML IJ SOLN: 4.0000 mg | Freq: Four times a day (QID) | INTRAMUSCULAR | Status: DC | PRN

## 2017-09-02 MED ORDER — THROMBIN (RECOMBINANT) 20000 UNITS EX SOLR
CUTANEOUS | Status: DC | PRN
  Administered 2017-09-02: 20000 [IU] via TOPICAL

## 2017-09-02 MED ORDER — SODIUM CHLORIDE 0.45 % IV SOLN
INTRAVENOUS | Status: DC | PRN
  Administered 2017-09-02: 10 mL/h via INTRAVENOUS

## 2017-09-02 MED ORDER — SODIUM BICARBONATE 8.4 % IV SOLN
50.0000 meq | Freq: Once | INTRAVENOUS | Status: AC
  Administered 2017-09-02: 50 meq via INTRAVENOUS

## 2017-09-02 MED ORDER — ORAL CARE MOUTH RINSE
15.0000 mL | OROMUCOSAL | Status: DC
  Administered 2017-09-02 – 2017-09-03 (×10): 15 mL via OROMUCOSAL

## 2017-09-02 MED ORDER — ARTIFICIAL TEARS OPHTHALMIC OINT
TOPICAL_OINTMENT | OPHTHALMIC | Status: DC | PRN
  Administered 2017-09-02 (×2): 1 via OPHTHALMIC

## 2017-09-02 MED ORDER — SODIUM CHLORIDE 0.9 % IV SOLN
0.0000 ug/min | INTRAVENOUS | Status: DC
  Administered 2017-09-02 (×2): 40 ug/min via INTRAVENOUS
  Filled 2017-09-02 (×3): qty 2
  Filled 2017-09-02: qty 20

## 2017-09-02 MED ORDER — OXYCODONE HCL 5 MG PO TABS
5.0000 mg | ORAL_TABLET | ORAL | Status: DC | PRN
  Administered 2017-09-04 (×2): 10 mg via ORAL
  Filled 2017-09-02 (×2): qty 2

## 2017-09-02 MED ORDER — SODIUM CHLORIDE 0.9 % IV SOLN
INTRAVENOUS | Status: DC
  Administered 2017-09-02: 10 mL/h via INTRAVENOUS

## 2017-09-02 MED ORDER — MIDAZOLAM HCL 10 MG/2ML IJ SOLN
INTRAMUSCULAR | Status: AC
  Filled 2017-09-02: qty 2

## 2017-09-02 MED ORDER — ALBUMIN HUMAN 5 % IV SOLN
INTRAVENOUS | Status: DC | PRN
  Administered 2017-09-02 (×2): via INTRAVENOUS

## 2017-09-02 MED ORDER — SODIUM CHLORIDE 0.9% FLUSH: 3.0000 mL | INTRAVENOUS | Status: DC | PRN

## 2017-09-02 MED ORDER — MORPHINE SULFATE (PF) 4 MG/ML IV SOLN
2.0000 mg | INTRAVENOUS | Status: DC | PRN
  Administered 2017-09-03: 4 mg via INTRAVENOUS
  Filled 2017-09-02: qty 1

## 2017-09-02 MED ORDER — CHLORHEXIDINE GLUCONATE 0.12% ORAL RINSE (MEDLINE KIT)
15.0000 mL | Freq: Two times a day (BID) | OROMUCOSAL | Status: DC
  Administered 2017-09-02 – 2017-09-03 (×2): 15 mL via OROMUCOSAL

## 2017-09-02 MED ORDER — PROPOFOL 10 MG/ML IV BOLUS
INTRAVENOUS | Status: DC | PRN
  Administered 2017-09-02: 70 mg via INTRAVENOUS

## 2017-09-02 MED ORDER — INSULIN REGULAR HUMAN 100 UNIT/ML IJ SOLN
INTRAMUSCULAR | Status: DC
  Filled 2017-09-02: qty 1

## 2017-09-02 MED FILL — Lidocaine HCl Local Inj 1%: INTRAMUSCULAR | Qty: 20 | Status: AC

## 2017-09-02 SURGICAL SUPPLY — 106 items
BAG DECANTER FOR FLEXI CONT (MISCELLANEOUS) ×4 IMPLANT
BANDAGE ACE 4X5 VEL STRL LF (GAUZE/BANDAGES/DRESSINGS) ×4 IMPLANT
BANDAGE ACE 6X5 VEL STRL LF (GAUZE/BANDAGES/DRESSINGS) ×4 IMPLANT
BASKET HEART  (ORDER IN 25'S) (MISCELLANEOUS) ×1
BASKET HEART (ORDER IN 25'S) (MISCELLANEOUS) ×1
BASKET HEART (ORDER IN 25S) (MISCELLANEOUS) ×2 IMPLANT
BLADE STERNUM SYSTEM 6 (BLADE) ×4 IMPLANT
BLADE SURG 11 STRL SS (BLADE) ×4 IMPLANT
BLADE SURG ROTATE 9660 (MISCELLANEOUS) ×4 IMPLANT
BNDG GAUZE ELAST 4 BULKY (GAUZE/BANDAGES/DRESSINGS) ×4 IMPLANT
CANISTER SUCT 3000ML PPV (MISCELLANEOUS) ×4 IMPLANT
CATH ROBINSON RED A/P 18FR (CATHETERS) ×8 IMPLANT
CATH THORACIC 28FR (CATHETERS) ×4 IMPLANT
CATH THORACIC 36FR (CATHETERS) ×4 IMPLANT
CATH THORACIC 36FR RT ANG (CATHETERS) ×4 IMPLANT
CLIP VESOCCLUDE MED 24/CT (CLIP) IMPLANT
CLIP VESOCCLUDE SM WIDE 24/CT (CLIP) ×4 IMPLANT
CRADLE DONUT ADULT HEAD (MISCELLANEOUS) ×4 IMPLANT
DERMABOND ADHESIVE PROPEN (GAUZE/BANDAGES/DRESSINGS) ×2
DERMABOND ADVANCED .7 DNX6 (GAUZE/BANDAGES/DRESSINGS) ×2 IMPLANT
DRAPE CARDIOVASCULAR INCISE (DRAPES) ×2
DRAPE SLUSH/WARMER DISC (DRAPES) ×4 IMPLANT
DRAPE SRG 135X102X78XABS (DRAPES) ×2 IMPLANT
DRSG COVADERM 4X14 (GAUZE/BANDAGES/DRESSINGS) ×4 IMPLANT
ELECT CAUTERY BLADE 6.4 (BLADE) ×4 IMPLANT
ELECT REM PT RETURN 9FT ADLT (ELECTROSURGICAL) ×8
ELECTRODE REM PT RTRN 9FT ADLT (ELECTROSURGICAL) ×4 IMPLANT
FELT TEFLON 1X6 (MISCELLANEOUS) ×8 IMPLANT
GAUZE SPONGE 4X4 12PLY STRL (GAUZE/BANDAGES/DRESSINGS) ×4 IMPLANT
GAUZE SPONGE 4X4 12PLY STRL LF (GAUZE/BANDAGES/DRESSINGS) ×4 IMPLANT
GLOVE BIO SURGEON STRL SZ 6 (GLOVE) ×8 IMPLANT
GLOVE BIO SURGEON STRL SZ 6.5 (GLOVE) ×15 IMPLANT
GLOVE BIO SURGEON STRL SZ7 (GLOVE) IMPLANT
GLOVE BIO SURGEON STRL SZ7.5 (GLOVE) IMPLANT
GLOVE BIO SURGEONS STRL SZ 6.5 (GLOVE) ×5
GLOVE BIOGEL PI IND STRL 6 (GLOVE) ×2 IMPLANT
GLOVE BIOGEL PI IND STRL 6.5 (GLOVE) IMPLANT
GLOVE BIOGEL PI IND STRL 7.0 (GLOVE) IMPLANT
GLOVE BIOGEL PI INDICATOR 6 (GLOVE) ×2
GLOVE BIOGEL PI INDICATOR 6.5 (GLOVE)
GLOVE BIOGEL PI INDICATOR 7.0 (GLOVE)
GLOVE EUDERMIC 7 POWDERFREE (GLOVE) ×8 IMPLANT
GLOVE ORTHO TXT STRL SZ7.5 (GLOVE) IMPLANT
GOWN STRL REUS W/ TWL LRG LVL3 (GOWN DISPOSABLE) ×16 IMPLANT
GOWN STRL REUS W/ TWL XL LVL3 (GOWN DISPOSABLE) ×2 IMPLANT
GOWN STRL REUS W/TWL LRG LVL3 (GOWN DISPOSABLE) ×16
GOWN STRL REUS W/TWL XL LVL3 (GOWN DISPOSABLE) ×2
HEMOSTAT POWDER SURGIFOAM 1G (HEMOSTASIS) ×12 IMPLANT
HEMOSTAT SURGICEL 2X14 (HEMOSTASIS) ×4 IMPLANT
INSERT FOGARTY 61MM (MISCELLANEOUS) IMPLANT
INSERT FOGARTY XLG (MISCELLANEOUS) IMPLANT
KIT BASIN OR (CUSTOM PROCEDURE TRAY) ×4 IMPLANT
KIT CATH CPB BARTLE (MISCELLANEOUS) ×4 IMPLANT
KIT ROOM TURNOVER OR (KITS) ×4 IMPLANT
KIT SUCTION CATH 14FR (SUCTIONS) ×4 IMPLANT
KIT VASOVIEW HEMOPRO VH 3000 (KITS) ×4 IMPLANT
NS IRRIG 1000ML POUR BTL (IV SOLUTION) ×20 IMPLANT
PACK E OPEN HEART (SUTURE) ×4 IMPLANT
PACK OPEN HEART (CUSTOM PROCEDURE TRAY) ×4 IMPLANT
PAD ARMBOARD 7.5X6 YLW CONV (MISCELLANEOUS) ×8 IMPLANT
PAD ELECT DEFIB RADIOL ZOLL (MISCELLANEOUS) ×4 IMPLANT
PENCIL BUTTON HOLSTER BLD 10FT (ELECTRODE) ×4 IMPLANT
PUNCH AORTIC ROTATE 4.0MM (MISCELLANEOUS) IMPLANT
PUNCH AORTIC ROTATE 4.5MM 8IN (MISCELLANEOUS) ×4 IMPLANT
PUNCH AORTIC ROTATE 5MM 8IN (MISCELLANEOUS) IMPLANT
SET CARDIOPLEGIA MPS 5001102 (MISCELLANEOUS) ×4 IMPLANT
SOLUTION ANTI FOG 6CC (MISCELLANEOUS) ×4 IMPLANT
SPONGE INTESTINAL PEANUT (DISPOSABLE) IMPLANT
SPONGE LAP 18X18 X RAY DECT (DISPOSABLE) ×4 IMPLANT
SPONGE LAP 4X18 X RAY DECT (DISPOSABLE) ×4 IMPLANT
SUT BONE WAX W31G (SUTURE) ×4 IMPLANT
SUT ETHILON 3 0 FSL (SUTURE) ×4 IMPLANT
SUT MNCRL AB 4-0 PS2 18 (SUTURE) ×4 IMPLANT
SUT PROLENE 3 0 SH DA (SUTURE) IMPLANT
SUT PROLENE 3 0 SH1 36 (SUTURE) ×8 IMPLANT
SUT PROLENE 4 0 RB 1 (SUTURE)
SUT PROLENE 4 0 SH DA (SUTURE) IMPLANT
SUT PROLENE 4-0 RB1 .5 CRCL 36 (SUTURE) IMPLANT
SUT PROLENE 5 0 C 1 36 (SUTURE) IMPLANT
SUT PROLENE 6 0 C 1 30 (SUTURE) IMPLANT
SUT PROLENE 7 0 BV 1 (SUTURE) IMPLANT
SUT PROLENE 7 0 BV1 MDA (SUTURE) ×8 IMPLANT
SUT PROLENE 8 0 BV175 6 (SUTURE) IMPLANT
SUT SILK  1 MH (SUTURE)
SUT SILK 1 MH (SUTURE) IMPLANT
SUT STEEL 6MS V (SUTURE) ×8 IMPLANT
SUT STEEL STERNAL CCS#1 18IN (SUTURE) IMPLANT
SUT STEEL SZ 6 DBL 3X14 BALL (SUTURE) IMPLANT
SUT VIC AB 1 CTX 36 (SUTURE) ×4
SUT VIC AB 1 CTX36XBRD ANBCTR (SUTURE) ×4 IMPLANT
SUT VIC AB 2-0 CT1 27 (SUTURE) ×2
SUT VIC AB 2-0 CT1 TAPERPNT 27 (SUTURE) ×2 IMPLANT
SUT VIC AB 2-0 CTX 27 (SUTURE) IMPLANT
SUT VIC AB 3-0 SH 27 (SUTURE)
SUT VIC AB 3-0 SH 27X BRD (SUTURE) IMPLANT
SUT VIC AB 3-0 X1 27 (SUTURE) IMPLANT
SUT VICRYL 4-0 PS2 18IN ABS (SUTURE) IMPLANT
SYSTEM SAHARA CHEST DRAIN ATS (WOUND CARE) ×4 IMPLANT
TAPE CLOTH SURG 4X10 WHT LF (GAUZE/BANDAGES/DRESSINGS) ×4 IMPLANT
TAPE PAPER 2X10 WHT MICROPORE (GAUZE/BANDAGES/DRESSINGS) ×4 IMPLANT
TOWEL GREEN STERILE (TOWEL DISPOSABLE) ×4 IMPLANT
TOWEL GREEN STERILE FF (TOWEL DISPOSABLE) ×4 IMPLANT
TRAY FOLEY SILVER 16FR TEMP (SET/KITS/TRAYS/PACK) ×4 IMPLANT
TUBING INSUFFLATION (TUBING) ×4 IMPLANT
UNDERPAD 30X30 (UNDERPADS AND DIAPERS) ×4 IMPLANT
WATER STERILE IRR 1000ML POUR (IV SOLUTION) ×8 IMPLANT

## 2017-09-02 NOTE — Anesthesia Postprocedure Evaluation (Signed)
Anesthesia Post Note  Patient: [name]  Procedure(s) Performed: CORONARY ARTERY BYPASS GRAFTING (CABG) x three , using left internal mammary artery and right leg greater saphenous vein harvested endoscopically (N/A Chest) TRANSESOPHAGEAL ECHOCARDIOGRAM (TEE) (N/A )     Patient location during evaluation: SICU Anesthesia Type: General Level of consciousness: sedated Pain management: pain level controlled Vital Signs Assessment: post-procedure vital signs reviewed and stable Respiratory status: patient remains intubated per anesthesia plan Cardiovascular status: stable Postop Assessment: no apparent nausea or vomiting Anesthetic complications: no    Last Vitals:  Vitals:   09/02/17 1245 09/02/17 1247  BP: 95/60 (!) 93/42  Pulse: 80 80  Resp: 12 (!) 25  Temp: 36.5 C   SpO2: 96% 97%    Last Pain:  Vitals:   09/02/17 0529  TempSrc: Oral  PainSc:                  Chyler Creely,Breon DANIEL

## 2017-09-02 NOTE — Progress Notes (Signed)
Patient ID: Randy [name], male   DOB: [birth year], 72 y.o.   MRN: [id number] EVENING ROUNDS NOTE :     [address]       ,[place] [address]             [phone]                 Day of Surgery Procedure(s) (LRB): CORONARY ARTERY BYPASS GRAFTING (CABG) x three , using left internal mammary artery and right leg greater saphenous vein harvested endoscopically (N/A) TRANSESOPHAGEAL ECHOCARDIOGRAM (TEE) (N/A)  Total Length of Stay:  LOS: 6 days  BP (!) 102/56   Pulse 80   Temp 98.1 F (36.7 C)   Resp 13   Ht 5\' 7"  (1.702 m)   Wt 182 lb 12.8 oz (82.9 kg)   SpO2 100%   BMI 28.63 kg/m   .Intake/Output      02/04 0701 - 02/05 0700   P.O.    I.V. (mL/kg) 3450.8 (41.6)   Blood 300   IV Piggyback 1875   Total Intake(mL/kg) 5625.8 (67.9)   Urine (mL/kg/hr) 1724 (1.6)   Stool    Blood 800   Chest Tube 150   Total Output 2674   Net +2951.8         . sodium chloride 10 mL/hr at 09/02/17 1900  . [START ON 09/03/2017] sodium chloride    . sodium chloride 10 mL/hr at 09/02/17 1900  . cefUROXime (ZINACEF)  IV Stopped (09/02/17 1602)  . dexmedetomidine (PRECEDEX) IV infusion 0.7 mcg/kg/hr (09/02/17 1900)  . famotidine (PEPCID) IV Stopped (09/02/17 1347)  . insulin (NOVOLIN-R) infusion 3.8 Units/hr (09/02/17 1859)  . lactated ringers    . lactated ringers 10 mL/hr at 09/02/17 1900  . lactated ringers 20 mL/hr at 09/02/17 1900  . nitroGLYCERIN Stopped (09/02/17 1236)  . phenylephrine (NEO-SYNEPHRINE) Adult infusion 70 mcg/min (09/02/17 1900)     Lab Results  Component Value Date   WBC 12.8 (H) 09/02/2017   HGB 9.6 (L) 09/02/2017   HCT 29.9 (L) 09/02/2017   PLT 121 (L) 09/02/2017   GLUCOSE 163 (H) 09/02/2017   CHOL 198 04/22/2017   TRIG (H) 04/22/2017    426.0 Triglyceride is over 400; calculations on Lipids are invalid.   HDL 28.80 (L) 04/22/2017   LDLDIRECT 97.0 04/22/2017   LDLCALC 115 (H) 03/09/2016   ALT 15 (L) 08/28/2017   AST 39 08/28/2017   NA  140 09/02/2017   K 5.1 09/02/2017   CL 106 09/02/2017   CREATININE 1.50 (H) 09/02/2017   BUN 29 (H) 09/02/2017   CO2 21 (L) 09/02/2017   TSH 2.113 08/27/2017   PSA 1.11 04/22/2017   INR 1.50 09/02/2017   HGBA1C 6.4 (H) 09/02/2017   MICROALBUR 6.2 (H) 03/09/2016   Required reintubation earlier  Sedated on vent now    Grace Isaac MD  [place] Office [phone] 09/02/2017 7:56 PM

## 2017-09-02 NOTE — Progress Notes (Signed)
Dr. Cyndia Bent called this RN for an update on pt. Noted that pt given 1 amp of bicarb earlier for acid-base deficit of 7. Orders received to check ABG again and give an amp of bicarb for a deficit of 3 or greater.  Acid base deficit is 3 on ABG recheck, will give another amp of bicarb per Dr. Cyndia Bent verbal order. Will continue to monitor.  Sherlie Ban, RN

## 2017-09-02 NOTE — Brief Op Note (Signed)
08/27/2017 - 09/02/2017  8:02 AM  PATIENT:  Randy [name]  72 y.o. male  PRE-OPERATIVE DIAGNOSIS:  CAD  POST-OPERATIVE DIAGNOSIS:  CAD  PROCEDURE:  Procedure(s):  CORONARY ARTERY BYPASS GRAFTING x 3 -LIMA to LAD -SVG to RAMUS INTERMEDIATE -SVG to PDA  ENDOSCOPIC HARVEST GREATER SAPHENOUS VEIN -Right Thigh to Below the Knee  TRANSESOPHAGEAL ECHOCARDIOGRAM (TEE) (N/A)  SURGEON:  Surgeon(s) and Role:    * Bartle, Fernande Boyden, MD - Primary  PHYSICIAN ASSISTANT: Ellwood Handler PA-C  ANESTHESIA:   general  EBL:  800 mL   BLOOD ADMINISTERED: CELLSAVER  DRAINS: Left Pleural Chest Tubes, Mediastinal Chest Drains   LOCAL MEDICATIONS USED:  NONE  SPECIMEN:  No Specimen  DISPOSITION OF SPECIMEN:  N/A  COUNTS:  YES  TOURNIQUET:  * No tourniquets in log *  DICTATION: .Dragon Dictation  PLAN OF CARE: Admit to inpatient   PATIENT DISPOSITION:  ICU - intubated and hemodynamically stable.   Delay start of Pharmacological VTE agent (>24hrs) due to surgical blood loss or risk of bleeding: yes

## 2017-09-02 NOTE — Anesthesia Procedure Notes (Deleted)
Performed by: Neldon Newport, CRNA

## 2017-09-02 NOTE — Plan of Care (Signed)
Patient extubated at 1750 to 4L O2, respiratory mechanics and ABG within parameters. Patient stated 10 minutes later that he could not breathe and O2 sats trending down quickly. Patient bagged with 100% O2, RT in room. Dr. Cyndia Bent paged and made aware, Orders to reintubate and keep on vent overnight. Precedex drip resumed, PCXR done. OG reinserted.

## 2017-09-02 NOTE — Transfer of Care (Signed)
Immediate Anesthesia Transfer of Care Note  Patient: Randy [name]  Procedure(s) Performed: CORONARY ARTERY BYPASS GRAFTING (CABG) x three , using left internal mammary artery and right leg greater saphenous vein harvested endoscopically (N/A Chest) TRANSESOPHAGEAL ECHOCARDIOGRAM (TEE) (N/A )  Patient Location: SICU  Anesthesia Type:General  Level of Consciousness: Patient remains intubated per anesthesia plan  Airway & Oxygen Therapy: Patient remains intubated per anesthesia plan and Patient placed on Ventilator (see vital sign flow sheet for setting)  Post-op Assessment: Report given to RN, Post -op Vital signs reviewed and stable and Patient moving all extremities X 4  Post vital signs: Reviewed and stable  Last Vitals:  Vitals:   09/01/17 2036 09/02/17 0529  BP: 119/62 (!) 98/55  Pulse: (!) 54 (!) 50  Resp: 12 17  Temp: 36.6 C 36.5 C  SpO2: 94% 97%    Last Pain:  Vitals:   09/02/17 0529  TempSrc: Oral  PainSc:          Complications: No apparent anesthesia complications

## 2017-09-02 NOTE — Progress Notes (Deleted)
  Echocardiogram 2D Echocardiogram has been performed.  Darlina Sicilian M 09/02/2017, 8:37 AM

## 2017-09-02 NOTE — Anesthesia Procedure Notes (Signed)
Procedure Name: Intubation Performed by: Neldon Newport, CRNA Pre-anesthesia Checklist: Patient identified, Emergency Drugs available, Suction available, Patient being monitored and Timeout performed Patient Re-evaluated:Patient Re-evaluated prior to induction Oxygen Delivery Method: Circle system utilized Preoxygenation: Pre-oxygenation with 100% oxygen Induction Type: IV induction Ventilation: Mask ventilation without difficulty Laryngoscope Size: Mac and 4 Grade View: Grade I Tube size: 8.0 mm Number of attempts: 1 Airway Equipment and Method: Stylet Placement Confirmation: ETT inserted through vocal cords under direct vision,  positive ETCO2 and breath sounds checked- equal and bilateral Secured at: 24 cm Tube secured with: Tape Dental Injury: Teeth and Oropharynx as per pre-operative assessment

## 2017-09-02 NOTE — Anesthesia Procedure Notes (Signed)
Central Venous Catheter Insertion Performed by: Annye Asa, MD, anesthesiologist Start/End2/10/2017 6:37 AM, 09/02/2017 6:50 AM Patient location: Pre-op. Preanesthetic checklist: patient identified, IV checked, risks and benefits discussed, surgical consent, monitors and equipment checked, pre-op evaluation, timeout performed and anesthesia consent Position: supine Lidocaine 1% used for infiltration and patient sedated Hand hygiene performed , maximum sterile barriers used  and Seldinger technique used Catheter size: 8.5 Fr PA cath was placed.Sheath introducer Swan type:thermodilution Procedure performed using ultrasound guided technique. Ultrasound Notes:anatomy identified, needle tip was noted to be adjacent to the nerve/plexus identified, no ultrasound evidence of intravascular and/or intraneural injection and image(s) printed for medical record Attempts: 1 Following insertion, line sutured, dressing applied and Biopatch. Post procedure assessment: blood return through all ports, free fluid flow and no air  Patient tolerated the procedure well with no immediate complications. Additional procedure comments: PA catheter:  Routine monitors. Timeout, sterile prep, drape, FBP R neck. Supine position.  1% [place]ido local, finder and trocar RIJ 1st pass with US guidance.  Cordis placed over J wire. PA catheter in easily.  Sterile dressing applied.  Patient tolerated well, VSS.  Jenita Seashore, MD.

## 2017-09-02 NOTE — Anesthesia Procedure Notes (Signed)
Arterial Line Insertion  Left, radial was placed Catheter size: 20 G Hand hygiene performed  and maximum sterile barriers used   Attempts: 2 Procedure performed without using ultrasound guided technique. Following insertion, dressing applied. Post procedure assessment: normal and unchanged  Patient tolerated the procedure well with no immediate complications.

## 2017-09-02 NOTE — Procedures (Signed)
Extubation Procedure Note  Patient Details:   Name: Randy [name] DOB: Feb 16, 1946 MRN: [id number]   Airway Documentation:  Airway 7.5 mm (Active)  Secured at (cm) 23 cm 09/02/2017  6:08 PM  Measured From Lips 09/02/2017  6:08 PM  [place] 09/02/2017  6:08 PM  Secured By [employer] 09/02/2017  6:08 PM  Cuff Pressure (cm H2O) 28 cm H2O 09/02/2017  6:08 PM  Site Condition Dry 09/02/2017  6:08 PM    Evaluation  O2 sats: stable throughout Complications: Complications of Around 10 minutes after extubation patient stated "I cannot breathe" & began to desat to 68%. Patient was immediately bagged with 100% FIO2. Dr. Cyndia Bent & anesthesia were paged.  Patient did not tolerate procedure well. Bilateral Breath Sounds: Diminished, Expiratory wheezes   Yes   Patient passed all parameters for extubation; NIF: -20, VC: .7, with a positive cuff leak. Patient was re intubated by anesthesia & placed on full support PRVC/SIMV/PS, VT: 660, RR: 12, PEEP: +5, FIO2: 50% with a pressure support of 10.  Miyeko Mahlum, Eddie North 09/02/2017, 5:50 PM

## 2017-09-02 NOTE — OR Nursing (Signed)
12:00pm - 20 minute call to SICU charge nurse

## 2017-09-02 NOTE — Progress Notes (Signed)
Spoke to Brain with [facility] regarding ICD. ETA 10 minutes

## 2017-09-02 NOTE — OR Nursing (Signed)
11:25am - 45 minute call to SICU charge nurse 

## 2017-09-02 NOTE — Op Note (Signed)
CARDIOVASCULAR SURGERY OPERATIVE NOTE  09/02/2017  Surgeon:  [name], MD  First Assistant: Ellwood Handler,  PA-C   Preoperative Diagnosis:  Severe multi-vessel coronary artery disease   Postoperative Diagnosis:  Same   Procedure:  1. Median Sternotomy 2. Extracorporeal circulation 3.   Coronary artery bypass grafting x 3   Left internal mammary graft to the LAD  SVG to Ramus  SVG to PDA  4.   Endoscopic vein harvest from the right leg   Anesthesia:  General Endotracheal   Clinical History/Surgical Indication:  The patient is a 72 year old gentleman with history of hypertension, dyslipidemia, type 2 diabetes, stage III chronic kidney disease with a creatinine of around 2, remote myocardial infarction with ischemic cardiomyopathy and an ejection fraction of 35% by echo in March 2018.  His cardiac history dates back to 2009 when he presented with shortness of breath and underwent a nuclear stress test which showed an ejection fraction of 32%.  There was evidence of prior inferolateral infarct with mild peri-infarct ischemia.  He had a cardiac catheterization at that time which showed an occluded proximal to mid right coronary artery with bridging collaterals filling the distal vessel.  There is mild left main narrowing.  He had a single chamber internal defibrillator placed by Dr. Lovena Le and has been followed by him since.  He underwent upgrade to a dual-chamber ICD with insertion of new atrial pacing lead in March 2018.  He reports having 2 shocks in November 2018 while playing golf.  Both shocks occurred during one round of golf and he had dizziness and felt poorly prior to each shock.  He reports having some chronic shortness of breath with exertion but has remained relatively active.  He has continued to play golf.  He is playing golf last Sunday and walk down a long hill and back up  the hill with significant shortness of breath and then felt poorly.  He sat down his golf cart and put his head down and was shocked by the defibrillator.  He had a second shock soon after that.  He denies any dizziness prior to these shocks.  He has never had any chest discomfort.  His wife reports that he does get short of breath with any significant activity and particularly when bending over.  He was admitted on 08/27/2017 with positive troponins peaking at 11.  An echocardiogram showed a drop in his ejection fraction to 25-30% with global hypokinesis.  There was no significant valvular disease.  Interrogation of his internal defibrillator showed that he had 3 episodes of ventricular tachycardia prior to admission 1 that was treated with antitachycardia pacing and tubes were treated with ICD shocks.  He was started on IV amiodarone.  He underwent cardiac catheterization earlier today that shows a diffusely diseased left main with 85% stenosis.  The ostial and proximal LAD has 50% stenosis.  There is a moderate size ramus branch that is 75% stenosis.  The left circumflex is a small vessel with 99% ostial stenosis.  The first marginal was occluded.  The right coronary artery has 90% proximal stenosis followed by complete occlusion in the midportion with bridging collaterals filling the distal vessel.  He had normal LVEDP.  The amiodarone was stopped because the arrhythmias were felt to be ischemic in nature.     Preparation:  The patient was seen in the preoperative holding area and the correct patient, correct operation were confirmed with the patient after reviewing the medical record and catheterization. The  consent was signed by me. Preoperative antibiotics were given. A pulmonary arterial line and radial arterial line were placed by the anesthesia team. The patient was taken back to the operating room and positioned supine on the operating room table. After being placed under general endotracheal  anesthesia by the anesthesia team a foley catheter was placed. The neck, chest, abdomen, and both legs were prepped with betadine soap and solution and draped in the usual sterile manner. A surgical time-out was taken and the correct patient and operative procedure were confirmed with the nursing and anesthesia staff.   TEE: performed by Dr. Tamela Gammon  Result status: Final result   Mitral valve: Trace regurgitation.  Aortic valve: No stenosis. Trace regurgitation.    MERGE Images   Show images for ECHO TEE  Patient Information   Patient Name Jaken, Fregia Sex Male DOB February 09, 1946 SSN [id number]  Reason For Exam  Priority: Routine  Comments: To be done by anesthesia MD intra-operatively   Echo Findings   Left Ventricle Normal wall thickness. Cavity is moderately dilated. LV systolic function is moderately reduced with an EF of 35-40%. Measured LVEF is (40%)%. Wall motion is abnormal. Basal Inferior/Lateral wall hypokinesis. Basal, inferior wall motion is hypokinetic. Basal, inferolateral wall motion is hypokinetic.  Septum Normal atrial and ventricular septum with no septal defect and normal septal motion.  Left Atrium Left atrium normal in structure and function. No thrombus present. No left atrial appendage. No mass present.  Aortic Valve Normal valve structure. Normal leaflet separation. No stenosis. Trace regurgitation.  Aorta Plaque present in the descending aorta is moderate (Grade 3: Moderate-Atheroma >3-36mm (no mobile/ulcer. comp.) and layered.  Mitral Valve Normal valve structure. Normal transmitral gradient. No stenosis. Normal leaflet mobility. Trace regurgitation.  Right Ventricle Normal cavity size, wall thickness and ejection fraction.  Right Atrium Normal right atrial size.  Tricuspid Valve Normal tricuspid valve structure. No stenosis. Trace regurgitation.  Pulmonic Valve Normal pulmonic valve structure. No stenosis. Mild regurgitation.  Pericardium Normal  pericardium with no pericardial effusion.  Post-Op TEE AORTA Aorta unchanged from pre-bypass.  LEFT VENTRICLE Left ventricle unchanged from pre-bypass (Basal inferior/lateral hypokinesis).  RIGHT VENTRICLE Right ventricle unchanged from pre-bypass.  AORTIC VALVE Aortic valve unchanged from pre-bypass MITRAL VALVE Mitral valve unchanged from pre-bypass.  TRICUSPID VALVE Tricuspid valve unchanged from pre-bypass.  Performing Technologist/Nurse   Performing Technologist/Nurse: Hassie Bruce        Aortic Valve Measurements   Stenosis  EF 40 %          Cardiopulmonary Bypass:  A median sternotomy was performed. The pericardium was opened in the midline. Right ventricular function appeared normal. The ascending aorta was of normal size and had no palpable plaque. The aortic arch had extensive calcified plaque.  There were no contraindications to aortic cannulation or cross-clamping. The patient was fully systemically heparinized and the ACT was maintained > 400 sec. The distal ascending aorta was cannulated with a 20 F aortic cannula for arterial inflow. Venous cannulation was performed via the right atrial appendage using a two-staged venous cannula. An antegrade cardioplegia/vent cannula was inserted into the mid-ascending aorta. Aortic occlusion was performed with a single cross-clamp. Systemic cooling to 32 degrees Centigrade and topical cooling of the heart with iced saline were used. Hyperkalemic antegrade cold blood cardioplegia was used to induce diastolic arrest and was then given at about 20 minute intervals throughout the period of arrest to maintain myocardial temperature at or below 10 degrees centigrade. A temperature  probe was inserted into the interventricular septum and an insulating pad was placed in the pericardium.   Left internal mammary harvest:  The left side of the sternum was retracted using the Rultract retractor. The left internal mammary artery was  harvested as a pedicle graft. All side branches were clipped. It was a large-sized vessel of good quality with excellent blood flow. It was ligated distally and divided. It was sprayed with topical papaverine solution to prevent vasospasm.   Endoscopic vein harvest:  The right greater saphenous vein was harvested endoscopically through a 2 cm incision medial to the right knee. It was harvested from the upper thigh to below the knee. It was a medium-sized vein of fair quality with some wall thickening. The side branches were all ligated with 4-0 silk ties.    Coronary arteries:  The coronary arteries were examined.   LAD:  Large vessel with no distal disease  LCX:  Large Ramus with no distal disease. The occluded OM was small and diffusely diseased with no visible lumen. There was extensive inferolateral infarction with scar throughout.  RCA:  Diffusely diseased. The PDA was graft-able with minimal disease. The PL was small. Again, extensive inferolateral scar.   Grafts:  1. LIMA to the LAD: 2.5 mm. It was sewn end to side using 8-0 prolene continuous suture. 2. SVG to Ramus:  1.75 mm. It was sewn end to side using 7-0 prolene continuous suture. 3. SVG to PDA:  1.6 mm. It was sewn end to side using 7-0 prolene continuous suture.   The proximal vein graft anastomoses were performed to the mid-ascending aorta using continuous 6-0 prolene suture. Graft markers were placed around the proximal anastomoses.   Completion:  The patient was rewarmed to 37 degrees Centigrade. The clamp was removed from the LIMA pedicle and there was rapid warming of the septum and return of ventricular fibrillation. The crossclamp was removed with a time of 60 minutes. There was spontaneous return of sinus rhythm. The distal and proximal anastomoses were checked for hemostasis. The position of the grafts was satisfactory. Two temporary epicardial pacing wires were placed on the right atrium and two on the right  ventricle. The patient was weaned from CPB without difficulty on no inotropes. CPB time was 77 minutes. Cardiac output was 4 LPM. Heparin was fully reversed with protamine and the aortic and venous cannulas removed. Hemostasis was achieved. Mediastinal and left pleural drainage tubes were placed. The sternum was closed with double #6 stainless steel wires. The fascia was closed with continuous # 1 vicryl suture. The subcutaneous tissue was closed with 2-0 vicryl continuous suture. The skin was closed with 3-0 vicryl subcuticular suture. All sponge, needle, and instrument counts were reported correct at the end of the case. Dry sterile dressings were placed over the incisions and around the chest tubes which were connected to pleurevac suction. The patient was then transported to the surgical intensive care unit in critical but stable condition.

## 2017-09-02 NOTE — Anesthesia Procedure Notes (Signed)
Procedure Name: Intubation Date/Time: 09/02/2017 4:06 PM Performed by: Bryson Corona, CRNA Pre-anesthesia Checklist: Patient identified, Emergency Drugs available, Suction available and Patient being monitored Patient Re-evaluated:Patient Re-evaluated prior to induction Oxygen Delivery Method: Circle System Utilized Preoxygenation: Pre-oxygenation with 100% oxygen Induction Type: IV induction Ventilation: Mask ventilation without difficulty Laryngoscope Size: Glidescope and 4 Grade View: Grade I Tube type: Oral Tube size: 7.5 mm Number of attempts: 1 Airway Equipment and Method: Stylet and Video-laryngoscopy Placement Confirmation: ETT inserted through vocal cords under direct vision,  positive ETCO2 and breath sounds checked- equal and bilateral Secured at: 23 cm Tube secured with: Tape Dental Injury: Teeth and Oropharynx as per pre-operative assessment

## 2017-09-03 ENCOUNTER — Ambulatory Visit: Payer: Medicare HMO | Admitting: Family Medicine

## 2017-09-03 ENCOUNTER — Encounter (HOSPITAL_COMMUNITY): Payer: Self-pay | Admitting: Surgery

## 2017-09-03 ENCOUNTER — Inpatient Hospital Stay (HOSPITAL_COMMUNITY): Payer: Medicare HMO

## 2017-09-03 DIAGNOSIS — Z951 Presence of aortocoronary bypass graft: Secondary | ICD-10-CM

## 2017-09-03 LAB — GLUCOSE, CAPILLARY
GLUCOSE-CAPILLARY: 117 mg/dL — AB (ref 65–99)
GLUCOSE-CAPILLARY: 109 mg/dL — AB (ref 65–99)
GLUCOSE-CAPILLARY: 115 mg/dL — AB (ref 65–99)
GLUCOSE-CAPILLARY: 150 mg/dL — AB (ref 65–99)
GLUCOSE-CAPILLARY: 150 mg/dL — AB (ref 65–99)
Glucose-Capillary: 100 mg/dL — ABNORMAL HIGH (ref 65–99)
Glucose-Capillary: 105 mg/dL — ABNORMAL HIGH (ref 65–99)
Glucose-Capillary: 106 mg/dL — ABNORMAL HIGH (ref 65–99)
Glucose-Capillary: 107 mg/dL — ABNORMAL HIGH (ref 65–99)
Glucose-Capillary: 110 mg/dL — ABNORMAL HIGH (ref 65–99)
Glucose-Capillary: 118 mg/dL — ABNORMAL HIGH (ref 65–99)
Glucose-Capillary: 122 mg/dL — ABNORMAL HIGH (ref 65–99)
Glucose-Capillary: 141 mg/dL — ABNORMAL HIGH (ref 65–99)
Glucose-Capillary: 153 mg/dL — ABNORMAL HIGH (ref 65–99)
Glucose-Capillary: 84 mg/dL (ref 65–99)
Glucose-Capillary: 95 mg/dL (ref 65–99)
Glucose-Capillary: 99 mg/dL (ref 65–99)

## 2017-09-03 LAB — CREATININE, SERUM
Creatinine, Ser: 1.69 mg/dL — ABNORMAL HIGH (ref 0.61–1.24)
GFR calc non Af Amer: 39 mL/min — ABNORMAL LOW (ref >60)
GFR, EST AFRICAN AMERICAN: 45 mL/min — AB (ref >60)

## 2017-09-03 LAB — CBC
HCT: 26.8 % — ABNORMAL LOW (ref 39.0–52.0)
HCT: 24.3 % — ABNORMAL LOW (ref 39.0–52.0)
HEMOGLOBIN: 8.8 g/dL — AB (ref 13.0–17.0)
HEMOGLOBIN: 8.1 g/dL — AB (ref 13.0–17.0)
MCH: 28.5 pg (ref 26.0–34.0)
MCH: 29.3 pg (ref 26.0–34.0)
MCHC: 32.8 g/dL (ref 30.0–36.0)
MCHC: 33.3 g/dL (ref 30.0–36.0)
MCV: 86.7 fL (ref 78.0–100.0)
MCV: 88 fL (ref 78.0–100.0)
PLATELETS: 117 10*3/uL — AB (ref 150–400)
Platelets: 95 10*3/uL — ABNORMAL LOW (ref 150–400)
RBC: 3.09 MIL/uL — AB (ref 4.22–5.81)
RBC: 2.76 MIL/uL — AB (ref 4.22–5.81)
RDW: 13.7 % (ref 11.5–15.5)
RDW: 14.3 % (ref 11.5–15.5)
WBC: 11.3 10*3/uL — AB (ref 4.0–10.5)
WBC: 10.8 10*3/uL — ABNORMAL HIGH (ref 4.0–10.5)

## 2017-09-03 LAB — POCT I-STAT, CHEM 8
BUN: 28 mg/dL — AB (ref 6–20)
CALCIUM ION: 1.15 mmol/L (ref 1.15–1.40)
CHLORIDE: 103 mmol/L (ref 101–111)
Creatinine, Ser: 1.6 mg/dL — ABNORMAL HIGH (ref 0.61–1.24)
GLUCOSE: 201 mg/dL — AB (ref 65–99)
HCT: 25 % — ABNORMAL LOW (ref 39.0–52.0)
Hemoglobin: 8.5 g/dL — ABNORMAL LOW (ref 13.0–17.0)
Potassium: 4.3 mmol/L (ref 3.5–5.1)
Sodium: 140 mmol/L (ref 135–145)
TCO2: 23 mmol/L (ref 22–32)

## 2017-09-03 LAB — POCT I-STAT 3, ART BLOOD GAS (G3+)
Acid-base deficit: 3 mmol/L — ABNORMAL HIGH (ref 0.0–2.0)
Acid-base deficit: 3 mmol/L — ABNORMAL HIGH (ref 0.0–2.0)
Bicarbonate: 21.9 mmol/L (ref 20.0–28.0)
Bicarbonate: 22.6 mmol/L (ref 20.0–28.0)
O2 Saturation: 98 %
O2 Saturation: 98 %
PCO2 ART: 39.6 mmHg (ref 32.0–48.0)
PO2 ART: 104 mmHg (ref 83.0–108.0)
Patient temperature: 36.2
TCO2: 23 mmol/L (ref 22–32)
TCO2: 24 mmol/L (ref 22–32)
pCO2 arterial: 35.6 mmHg (ref 32.0–48.0)
pH, Arterial: 7.394 (ref 7.350–7.450)
pH, Arterial: 7.359 (ref 7.350–7.450)
pO2, Arterial: 109 mmHg — ABNORMAL HIGH (ref 83.0–108.0)

## 2017-09-03 LAB — BASIC METABOLIC PANEL
ANION GAP: 12 (ref 5–15)
BUN: 29 mg/dL — ABNORMAL HIGH (ref 6–20)
CHLORIDE: 107 mmol/L (ref 101–111)
CO2: 21 mmol/L — ABNORMAL LOW (ref 22–32)
Calcium: 8.3 mg/dL — ABNORMAL LOW (ref 8.9–10.3)
Creatinine, Ser: 1.68 mg/dL — ABNORMAL HIGH (ref 0.61–1.24)
GFR calc non Af Amer: 39 mL/min — ABNORMAL LOW (ref >60)
GFR, EST AFRICAN AMERICAN: 46 mL/min — AB (ref >60)
Glucose, Bld: 112 mg/dL — ABNORMAL HIGH (ref 65–99)
POTASSIUM: 4.5 mmol/L (ref 3.5–5.1)
SODIUM: 140 mmol/L (ref 135–145)

## 2017-09-03 LAB — MAGNESIUM
MAGNESIUM: 2.5 mg/dL — AB (ref 1.7–2.4)
Magnesium: 2.3 mg/dL (ref 1.7–2.4)

## 2017-09-03 MED ORDER — ENOXAPARIN SODIUM 30 MG/0.3ML ~~LOC~~ SOLN
30.0000 mg | Freq: Every day | SUBCUTANEOUS | Status: DC
  Administered 2017-09-03 – 2017-09-09 (×7): 30 mg via SUBCUTANEOUS
  Filled 2017-09-03 (×7): qty 0.3

## 2017-09-03 MED ORDER — INSULIN DETEMIR 100 UNIT/ML ~~LOC~~ SOLN
10.0000 [IU] | Freq: Every day | SUBCUTANEOUS | Status: DC
  Administered 2017-09-04: 10 [IU] via SUBCUTANEOUS
  Filled 2017-09-03 (×2): qty 0.1

## 2017-09-03 MED ORDER — LEVALBUTEROL HCL 0.63 MG/3ML IN NEBU
0.6300 mg | INHALATION_SOLUTION | Freq: Four times a day (QID) | RESPIRATORY_TRACT | Status: DC
  Administered 2017-09-03 – 2017-09-04 (×3): 0.63 mg via RESPIRATORY_TRACT
  Filled 2017-09-03 (×3): qty 3

## 2017-09-03 MED ORDER — INSULIN DETEMIR 100 UNIT/ML ~~LOC~~ SOLN
15.0000 [IU] | Freq: Once | SUBCUTANEOUS | Status: AC
  Administered 2017-09-03: 15 [IU] via SUBCUTANEOUS
  Filled 2017-09-03: qty 0.15

## 2017-09-03 MED ORDER — INSULIN ASPART 100 UNIT/ML ~~LOC~~ SOLN
0.0000 [IU] | SUBCUTANEOUS | Status: DC
  Administered 2017-09-03: 2 [IU] via SUBCUTANEOUS
  Administered 2017-09-03: 4 [IU] via SUBCUTANEOUS
  Administered 2017-09-04 (×5): 2 [IU] via SUBCUTANEOUS

## 2017-09-03 MED FILL — Heparin Sodium (Porcine) Inj 1000 Unit/ML: INTRAMUSCULAR | Qty: 30 | Status: AC

## 2017-09-03 MED FILL — Thrombin For Soln 20000 Unit: CUTANEOUS | Qty: 1 | Status: AC

## 2017-09-03 MED FILL — Magnesium Sulfate Inj 50%: INTRAMUSCULAR | Qty: 10 | Status: AC

## 2017-09-03 MED FILL — Potassium Chloride Inj 2 mEq/ML: INTRAVENOUS | Qty: 40 | Status: AC

## 2017-09-03 NOTE — Progress Notes (Signed)
1 Day Post-Op Procedure(s) (LRB): CORONARY ARTERY BYPASS GRAFTING (CABG) x three , using left internal mammary artery and right leg greater saphenous vein harvested endoscopically (N/A) TRANSESOPHAGEAL ECHOCARDIOGRAM (TEE) (N/A) Subjective: Still intubated but alert and no complaints. Ready to get ETT out  Objective: Vital signs in last 24 hours: Temp:  [96.3 F (35.7 C)-98.8 F (37.1 C)] 97.2 F (36.2 C) (02/05 0700) Pulse Rate:  [79-93] 80 (02/05 0700) Cardiac Rhythm: Atrial paced (02/05 0400) Resp:  [10-25] 12 (02/05 0700) BP: (93-155)/(42-84) 124/77 (02/05 0700) SpO2:  [93 %-100 %] 100 % (02/05 0700) Arterial Line BP: (82-142)/(40-69) 112/53 (02/05 0700) FiO2 (%):  [40 %-50 %] 50 % (02/05 0400) Weight:  [91.3 kg (201 lb 4.5 oz)] 91.3 kg (201 lb 4.5 oz) (02/05 0400)  Hemodynamic parameters for last 24 hours: PAP: (14-52)/(6-23) 15/6 CO:  [3.9 L/min-5.8 L/min] 4.7 L/min CI:  [2 L/min/m2-3 L/min/m2] 2.4 L/min/m2  Intake/Output from previous day: 02/04 0701 - 02/05 0700 In: 6707.9 [I.V.:4302.9; Blood:300; NG/GT:130; IV Piggyback:1975] Out: 1829 [HBZJI:9678; Blood:800; Chest Tube:290] Intake/Output this shift: No intake/output data recorded.  General appearance: alert and cooperative Neurologic: intact Heart: regular rate and rhythm, S1, S2 normal, no murmur, click, rub or gallop Lungs: clear to auscultation bilaterally Extremities: edema moderate  Wound: dressings dry  Lab Results: Recent Labs    09/02/17 1837 09/03/17 0256  WBC 12.8* 11.3*  HGB 9.6* 8.8*  HCT 29.9* 26.8*  PLT 121* 117*   BMET:  Recent Labs    09/02/17 0445  09/02/17 1825 09/03/17 0256  NA 138   < > 140 140  K 4.8   < > 5.1 4.5  CL 108   < > 106 107  CO2 21*  --   --  21*  GLUCOSE 93   < > 163* 112*  BUN 38*   < > 29* 29*  CREATININE 1.72*   < > 1.50* 1.68*  CALCIUM 8.9  --   --  8.3*   < > = values in this interval not displayed.    PT/INR:  Recent Labs    09/02/17 1237  LABPROT  18.0*  INR 1.50   ABG    Component Value Date/Time   PHART 7.394 09/03/2017 0502   HCO3 21.9 09/03/2017 0502   TCO2 23 09/03/2017 0502   ACIDBASEDEF 3.0 (H) 09/03/2017 0502   O2SAT 98.0 09/03/2017 0502   CBG (last 3)  Recent Labs    09/03/17 0403 09/03/17 0500 09/03/17 0559  GLUCAP 117* 118* 109*   CXR: clear  ECG: sinus, no acute changes  Assessment/Plan: S/P Procedure(s) (LRB): CORONARY ARTERY BYPASS GRAFTING (CABG) x three , using left internal mammary artery and right leg greater saphenous vein harvested endoscopically (N/A) TRANSESOPHAGEAL ECHOCARDIOGRAM (TEE) (N/A)  Hemodynamically stable in sinus rhythm, paced AAI 80. Hold beta blocker for now.  Re-intubated last pm shortly after extubation. Not clear why he could not move air. He does have significant COPD on exam. Will extubate this am.  Stage III CKD: follow creat. Will hold off on lasix now since on neo and PAP low.  DM: glucose under good control. Preop Hgb A1c was 6.4. Will give him a dose of Levemir and start SSI.  LOS: 7 days    [name] 09/03/2017

## 2017-09-03 NOTE — Progress Notes (Signed)
Pt ambulated 370 ft with avasys walker. Pt was standby assist. No complaints of pain. Pt states, " You know how long it has been since I have been able to walk this far?!" Pt very pleased with progress and is now sitting in recliner drinking his liquids. Will continue to monitor closely. VSS.  Lucius Conn, RN

## 2017-09-03 NOTE — Progress Notes (Signed)
[facility] BRIEF SICU PROGRESS NOTE  1 Day Post-Op  S/P Procedure(s) (LRB): CORONARY ARTERY BYPASS GRAFTING (CABG) x three , using left internal mammary artery and right leg greater saphenous vein harvested endoscopically (N/A) TRANSESOPHAGEAL ECHOCARDIOGRAM (TEE) (N/A)   Stable day Extubated uneventfully this morning AAP paced w/ stable BP on very low dose Neo Breathing comfortably w/ O2 sats 97-100% UOP adequate Labs okay w/ creatinine stable 1.6  Plan: Continue current plan  Rexene Alberts, MD 09/03/2017 7:23 PM

## 2017-09-03 NOTE — Procedures (Signed)
Extubation Procedure Note  Patient Details:   Name: Randy [name] DOB: 04-Jun-1946 MRN: [id number]   Airway Documentation:     Evaluation  O2 sats: stable throughout Complications: No apparent complications Patient did tolerate procedure well. Bilateral Breath Sounds: Clear   Yes   Patient was extubated to a 4L [place] without any complications, dyspnea or stridor noted. Patient achieved a goal of 1.8L on VC & -25 on NIF. Patient was instructed on IS x 5, highest goal reached was 1579mL.  Alessia Gonsalez L 09/03/2017, 9:30 AM

## 2017-09-04 ENCOUNTER — Inpatient Hospital Stay (HOSPITAL_COMMUNITY): Payer: Medicare HMO

## 2017-09-04 LAB — BASIC METABOLIC PANEL
ANION GAP: 10 (ref 5–15)
BUN: 28 mg/dL — ABNORMAL HIGH (ref 6–20)
CO2: 23 mmol/L (ref 22–32)
CREATININE: 1.59 mg/dL — AB (ref 0.61–1.24)
Calcium: 8.2 mg/dL — ABNORMAL LOW (ref 8.9–10.3)
Chloride: 103 mmol/L (ref 101–111)
GFR calc non Af Amer: 42 mL/min — ABNORMAL LOW (ref >60)
GFR, EST AFRICAN AMERICAN: 49 mL/min — AB (ref >60)
Glucose, Bld: 134 mg/dL — ABNORMAL HIGH (ref 65–99)
POTASSIUM: 4.1 mmol/L (ref 3.5–5.1)
SODIUM: 136 mmol/L (ref 135–145)

## 2017-09-04 LAB — CBC
HCT: 25.3 % — ABNORMAL LOW (ref 39.0–52.0)
HEMOGLOBIN: 8.1 g/dL — AB (ref 13.0–17.0)
MCH: 28.1 pg (ref 26.0–34.0)
MCHC: 32 g/dL (ref 30.0–36.0)
MCV: 87.8 fL (ref 78.0–100.0)
PLATELETS: 105 10*3/uL — AB (ref 150–400)
RBC: 2.88 MIL/uL — AB (ref 4.22–5.81)
RDW: 14.3 % (ref 11.5–15.5)
WBC: 11.9 10*3/uL — AB (ref 4.0–10.5)

## 2017-09-04 LAB — GLUCOSE, CAPILLARY
GLUCOSE-CAPILLARY: 126 mg/dL — AB (ref 65–99)
GLUCOSE-CAPILLARY: 154 mg/dL — AB (ref 65–99)
Glucose-Capillary: 116 mg/dL — ABNORMAL HIGH (ref 65–99)
Glucose-Capillary: 121 mg/dL — ABNORMAL HIGH (ref 65–99)
Glucose-Capillary: 125 mg/dL — ABNORMAL HIGH (ref 65–99)
Glucose-Capillary: 128 mg/dL — ABNORMAL HIGH (ref 65–99)

## 2017-09-04 MED ORDER — AMIODARONE LOAD VIA INFUSION
150.0000 mg | Freq: Once | INTRAVENOUS | Status: AC
Start: 2017-09-04 — End: 2017-09-04
  Administered 2017-09-04: 150 mg via INTRAVENOUS
  Filled 2017-09-04: qty 83.34

## 2017-09-04 MED ORDER — METOLAZONE 5 MG PO TABS
5.0000 mg | ORAL_TABLET | Freq: Once | ORAL | Status: AC
  Administered 2017-09-04: 5 mg via ORAL
  Filled 2017-09-04: qty 1

## 2017-09-04 MED ORDER — TAMSULOSIN HCL 0.4 MG PO CAPS
0.4000 mg | ORAL_CAPSULE | Freq: Every day | ORAL | Status: DC
  Administered 2017-09-04 – 2017-09-08 (×5): 0.4 mg via ORAL
  Filled 2017-09-04 (×5): qty 1

## 2017-09-04 MED ORDER — CARVEDILOL 3.125 MG PO TABS
3.1250 mg | ORAL_TABLET | Freq: Two times a day (BID) | ORAL | Status: DC
  Administered 2017-09-04 – 2017-09-08 (×7): 3.125 mg via ORAL
  Filled 2017-09-04 (×10): qty 1

## 2017-09-04 MED ORDER — AMIODARONE HCL IN DEXTROSE 360-4.14 MG/200ML-% IV SOLN
60.0000 mg/h | INTRAVENOUS | Status: AC
  Administered 2017-09-04 (×2): 60 mg/h via INTRAVENOUS
  Filled 2017-09-04 (×2): qty 200

## 2017-09-04 MED ORDER — AMIODARONE HCL IN DEXTROSE 360-4.14 MG/200ML-% IV SOLN
30.0000 mg/h | INTRAVENOUS | Status: DC
  Administered 2017-09-04 – 2017-09-05 (×2): 30 mg/h via INTRAVENOUS
  Filled 2017-09-04 (×2): qty 200

## 2017-09-04 MED ORDER — LEVALBUTEROL HCL 0.63 MG/3ML IN NEBU: 0.6300 mg | INHALATION_SOLUTION | Freq: Four times a day (QID) | RESPIRATORY_TRACT | Status: DC | PRN

## 2017-09-04 MED ORDER — FUROSEMIDE 10 MG/ML IJ SOLN
40.0000 mg | Freq: Once | INTRAMUSCULAR | Status: AC
  Administered 2017-09-04: 40 mg via INTRAVENOUS
  Filled 2017-09-04: qty 4

## 2017-09-04 NOTE — Progress Notes (Signed)
2 Days Post-Op Procedure(s) (LRB): CORONARY ARTERY BYPASS GRAFTING (CABG) x three , using left internal mammary artery and right leg greater saphenous vein harvested endoscopically (N/A) TRANSESOPHAGEAL ECHOCARDIOGRAM (TEE) (N/A) Subjective:  No complaints. No shortness of breath.  Objective: Vital signs in last 24 hours: Temp:  [96.8 F (36 C)-98.3 F (36.8 C)] 97.7 F (36.5 C) (02/06 0347) Pulse Rate:  [67-128] 84 (02/06 0700) Cardiac Rhythm: Atrial fibrillation (02/06 0400) Resp:  [8-20] 14 (02/06 0700) BP: (85-122)/(46-77) 120/70 (02/06 0700) SpO2:  [91 %-100 %] 96 % (02/06 0700) Arterial Line BP: (85-104)/(44-59) 97/50 (02/05 1500) FiO2 (%):  [40 %] 40 % (02/05 0850) Weight:  [89.8 kg (197 lb 15.6 oz)] 89.8 kg (197 lb 15.6 oz) (02/06 0500)  Hemodynamic parameters for last 24 hours: PAP: (17-25)/(9-13) 24/13  Intake/Output from previous day: 02/05 0701 - 02/06 0700 In: 1256.4 [P.O.:420; I.V.:736.4; IV Piggyback:100] Out: 955 [Urine:900; Chest Tube:55] Intake/Output this shift: No intake/output data recorded.  General appearance: alert and cooperative Neurologic: intact Heart: irregularly irregular rhythm Lungs: clear to auscultation bilaterally Extremities: edema mild Wound: incisions ok  Lab Results: Recent Labs    09/03/17 1638 09/03/17 1646 09/04/17 0415  WBC 10.8*  --  11.9*  HGB 8.1* 8.5* 8.1*  HCT 24.3* 25.0* 25.3*  PLT 95*  --  105*   BMET:  Recent Labs    09/03/17 0256  09/03/17 1646 09/04/17 0415  NA 140  --  140 136  K 4.5  --  4.3 4.1  CL 107  --  103 103  CO2 21*  --   --  23  GLUCOSE 112*  --  201* 134*  BUN 29*  --  28* 28*  CREATININE 1.68*   < > 1.60* 1.59*  CALCIUM 8.3*  --   --  8.2*   < > = values in this interval not displayed.    PT/INR:  Recent Labs    09/02/17 1237  LABPROT 18.0*  INR 1.50   ABG    Component Value Date/Time   PHART 7.359 09/03/2017 0924   HCO3 22.6 09/03/2017 0924   TCO2 23 09/03/2017 1646   ACIDBASEDEF 3.0 (H) 09/03/2017 0924   O2SAT 98.0 09/03/2017 0924   CBG (last 3)  Recent Labs    09/03/17 2030 09/03/17 2322 09/04/17 0346  GLUCAP 141* 110* 126*   CXR: clear  Assessment/Plan: S/P Procedure(s) (LRB): CORONARY ARTERY BYPASS GRAFTING (CABG) x three , using left internal mammary artery and right leg greater saphenous vein harvested endoscopically (N/A) TRANSESOPHAGEAL ECHOCARDIOGRAM (TEE) (N/A)  POD 2 He has been hemodynamically stable   Postop atrial fib last night and started on amio. Still in atrial fib with controlled rate 80's and some pacing from internal pacer/ICD. Continue IV amio today.  Stage 3 CKD: creatinine stable. Start diuresis.  COPD: stable. Continue Xopenex prn.  Preop urinary retention: probably prostate enlargement. Will keep foley in for now since diuresing and start Flomax.  DM: glucose under good control on Levemir and SSI.  Continue ambulation and IS.   LOS: 8 days    [name] 09/04/2017

## 2017-09-04 NOTE — Care Management Note (Signed)
Case Management Note  Patient Details  Name: Randy [name] MRN: [id number] Date of Birth: [birth year]  Subjective/Objective:  From home with wife POD 2 CABG with post op afib, conts on amio drip , cont to diuresis,  Ambulated on unit today.  He has PCP listed and [facility].                   Action/Plan: NCM will follow for dc needs.   Expected Discharge Date:                  Expected Discharge Plan:  Home/Self Care  In-House Referral:     Discharge planning Services  CM Consult  Post Acute Care Choice:    Choice offered to:     DME Arranged:    DME Agency:     HH Arranged:    HH Agency:     Status of Service:  In process, will continue to follow  If discussed at Long Length of Stay Meetings, dates discussed:    Additional Comments:  Zenon Mayo, RN 09/04/2017, 7:28 PM

## 2017-09-04 NOTE — Progress Notes (Signed)
CT surgery p.m. Rounds  Resting quietly in bed Sinus rhythm stable hemodynamics Stable day

## 2017-09-05 LAB — GLUCOSE, CAPILLARY
GLUCOSE-CAPILLARY: 103 mg/dL — AB (ref 65–99)
GLUCOSE-CAPILLARY: 96 mg/dL (ref 65–99)
Glucose-Capillary: 117 mg/dL — ABNORMAL HIGH (ref 65–99)
Glucose-Capillary: 150 mg/dL — ABNORMAL HIGH (ref 65–99)
Glucose-Capillary: 75 mg/dL (ref 65–99)

## 2017-09-05 LAB — CBC
HEMATOCRIT: 24.5 % — AB (ref 39.0–52.0)
Hemoglobin: 7.9 g/dL — ABNORMAL LOW (ref 13.0–17.0)
MCH: 28.6 pg (ref 26.0–34.0)
MCHC: 32.2 g/dL (ref 30.0–36.0)
MCV: 88.8 fL (ref 78.0–100.0)
PLATELETS: 115 10*3/uL — AB (ref 150–400)
RBC: 2.76 MIL/uL — ABNORMAL LOW (ref 4.22–5.81)
RDW: 14.4 % (ref 11.5–15.5)
WBC: 9.2 10*3/uL (ref 4.0–10.5)

## 2017-09-05 LAB — BASIC METABOLIC PANEL
Anion gap: 9 (ref 5–15)
BUN: 34 mg/dL — AB (ref 6–20)
CHLORIDE: 102 mmol/L (ref 101–111)
CO2: 25 mmol/L (ref 22–32)
CREATININE: 1.87 mg/dL — AB (ref 0.61–1.24)
Calcium: 8.2 mg/dL — ABNORMAL LOW (ref 8.9–10.3)
GFR calc Af Amer: 40 mL/min — ABNORMAL LOW (ref >60)
GFR calc non Af Amer: 35 mL/min — ABNORMAL LOW (ref >60)
Glucose, Bld: 118 mg/dL — ABNORMAL HIGH (ref 65–99)
Potassium: 4.3 mmol/L (ref 3.5–5.1)
SODIUM: 136 mmol/L (ref 135–145)

## 2017-09-05 MED ORDER — GLIPIZIDE 5 MG PO TABS
5.0000 mg | ORAL_TABLET | Freq: Every day | ORAL | Status: DC
  Administered 2017-09-05 – 2017-09-10 (×6): 5 mg via ORAL
  Filled 2017-09-05 (×7): qty 1

## 2017-09-05 MED ORDER — ONDANSETRON HCL 4 MG PO TABS: 4.0000 mg | ORAL_TABLET | Freq: Four times a day (QID) | ORAL | Status: DC | PRN

## 2017-09-05 MED ORDER — BISACODYL 5 MG PO TBEC
10.0000 mg | DELAYED_RELEASE_TABLET | Freq: Every day | ORAL | Status: DC | PRN
  Administered 2017-09-05 – 2017-09-09 (×3): 10 mg via ORAL
  Filled 2017-09-05 (×3): qty 2

## 2017-09-05 MED ORDER — MOVING RIGHT ALONG BOOK
Freq: Once | Status: AC
  Administered 2017-09-06: 19:00:00
  Filled 2017-09-05: qty 1

## 2017-09-05 MED ORDER — ONDANSETRON HCL 4 MG/2ML IJ SOLN: 4.0000 mg | Freq: Four times a day (QID) | INTRAMUSCULAR | Status: DC | PRN

## 2017-09-05 MED ORDER — PANTOPRAZOLE SODIUM 40 MG PO TBEC
40.0000 mg | DELAYED_RELEASE_TABLET | Freq: Every day | ORAL | Status: DC
  Administered 2017-09-06 – 2017-09-10 (×5): 40 mg via ORAL
  Filled 2017-09-05 (×5): qty 1

## 2017-09-05 MED ORDER — INSULIN ASPART 100 UNIT/ML ~~LOC~~ SOLN
0.0000 [IU] | Freq: Three times a day (TID) | SUBCUTANEOUS | Status: DC
  Administered 2017-09-05: 2 [IU] via SUBCUTANEOUS

## 2017-09-05 MED ORDER — SODIUM CHLORIDE 0.9% FLUSH
3.0000 mL | Freq: Two times a day (BID) | INTRAVENOUS | Status: DC
  Administered 2017-09-06 – 2017-09-09 (×9): 3 mL via INTRAVENOUS

## 2017-09-05 MED ORDER — INSULIN ASPART 100 UNIT/ML ~~LOC~~ SOLN
0.0000 [IU] | Freq: Three times a day (TID) | SUBCUTANEOUS | Status: DC
  Administered 2017-09-06 – 2017-09-10 (×5): 2 [IU] via SUBCUTANEOUS

## 2017-09-05 MED ORDER — AMIODARONE HCL 200 MG PO TABS
400.0000 mg | ORAL_TABLET | Freq: Two times a day (BID) | ORAL | Status: DC
  Administered 2017-09-05 – 2017-09-06 (×4): 400 mg via ORAL
  Filled 2017-09-05 (×4): qty 2

## 2017-09-05 MED ORDER — OXYCODONE HCL 5 MG PO TABS: 5.0000 mg | ORAL_TABLET | ORAL | Status: DC | PRN

## 2017-09-05 MED ORDER — BISACODYL 10 MG RE SUPP: 10.0000 mg | Freq: Every day | RECTAL | Status: DC | PRN

## 2017-09-05 MED ORDER — SODIUM CHLORIDE 0.9% FLUSH: 3.0000 mL | INTRAVENOUS | Status: DC | PRN

## 2017-09-05 MED ORDER — SODIUM CHLORIDE 0.9 % IV SOLN: 250.0000 mL | INTRAVENOUS | Status: DC | PRN

## 2017-09-05 MED ORDER — ACETAMINOPHEN 325 MG PO TABS
650.0000 mg | ORAL_TABLET | Freq: Four times a day (QID) | ORAL | Status: DC | PRN
Start: 2017-09-05 — End: 2017-09-10
  Administered 2017-09-10: 650 mg via ORAL
  Filled 2017-09-05: qty 2

## 2017-09-05 MED ORDER — DOCUSATE SODIUM 100 MG PO CAPS
200.0000 mg | ORAL_CAPSULE | Freq: Every day | ORAL | Status: DC
  Administered 2017-09-06 – 2017-09-10 (×5): 200 mg via ORAL
  Filled 2017-09-05 (×5): qty 2

## 2017-09-05 MED ORDER — ASPIRIN EC 325 MG PO TBEC
325.0000 mg | DELAYED_RELEASE_TABLET | Freq: Every day | ORAL | Status: DC
  Administered 2017-09-06 – 2017-09-10 (×5): 325 mg via ORAL
  Filled 2017-09-05 (×5): qty 1

## 2017-09-05 MED FILL — Sodium Chloride IV Soln 0.9%: INTRAVENOUS | Qty: 2000 | Status: AC

## 2017-09-05 MED FILL — Mannitol IV Soln 20%: INTRAVENOUS | Qty: 500 | Status: AC

## 2017-09-05 MED FILL — Lidocaine HCl IV Inj 20 MG/ML: INTRAVENOUS | Qty: 5 | Status: AC

## 2017-09-05 MED FILL — Heparin Sodium (Porcine) Inj 1000 Unit/ML: INTRAMUSCULAR | Qty: 10 | Status: AC

## 2017-09-05 MED FILL — Sodium Bicarbonate IV Soln 8.4%: INTRAVENOUS | Qty: 50 | Status: AC

## 2017-09-05 MED FILL — Electrolyte-R (PH 7.4) Solution: INTRAVENOUS | Qty: 3000 | Status: AC

## 2017-09-05 NOTE — Progress Notes (Signed)
3 Days Post-Op Procedure(s) (LRB): CORONARY ARTERY BYPASS GRAFTING (CABG) x three , using left internal mammary artery and right leg greater saphenous vein harvested endoscopically (N/A) TRANSESOPHAGEAL ECHOCARDIOGRAM (TEE) (N/A) Subjective: No complaints  Objective: Vital signs in last 24 hours: Temp:  [98 F (36.7 C)-98.8 F (37.1 C)] 98 F (36.7 C) (02/06 2300) Pulse Rate:  [63-74] 65 (02/07 0800) Cardiac Rhythm: Normal sinus rhythm (02/07 0800) Resp:  [8-21] 10 (02/07 0800) BP: (83-127)/(52-70) 107/59 (02/07 0800) SpO2:  [91 %-100 %] 99 % (02/07 0800) Weight:  [89.4 kg (197 lb)] 89.4 kg (197 lb) (02/07 0500)  Hemodynamic parameters for last 24 hours:    Intake/Output from previous day: 02/06 0701 - 02/07 0700 In: 1235.2 [P.O.:550; I.V.:685.2] Out: 1395 [Urine:1395] Intake/Output this shift: Total I/O In: 26.7 [I.V.:26.7] Out: -   General appearance: alert and cooperative Neurologic: intact Heart: regular rate and rhythm, S1, S2 normal, no murmur, click, rub or gallop Lungs: clear to auscultation bilaterally Extremities: edema mild Wound: incisions ok  Lab Results: Recent Labs    09/04/17 0415 09/05/17 0346  WBC 11.9* 9.2  HGB 8.1* 7.9*  HCT 25.3* 24.5*  PLT 105* 115*   BMET:  Recent Labs    09/04/17 0415 09/05/17 0346  NA 136 136  K 4.1 4.3  CL 103 102  CO2 23 25  GLUCOSE 134* 118*  BUN 28* 34*  CREATININE 1.59* 1.87*  CALCIUM 8.2* 8.2*    PT/INR:  Recent Labs    09/02/17 1237  LABPROT 18.0*  INR 1.50   ABG    Component Value Date/Time   PHART 7.359 09/03/2017 0924   HCO3 22.6 09/03/2017 0924   TCO2 23 09/03/2017 1646   ACIDBASEDEF 3.0 (H) 09/03/2017 0924   O2SAT 98.0 09/03/2017 0924   CBG (last 3)  Recent Labs    09/04/17 1945 09/04/17 2338 09/05/17 0359  GLUCAP 121* 116* 117*    Assessment/Plan: S/P Procedure(s) (LRB): CORONARY ARTERY BYPASS GRAFTING (CABG) x three , using left internal mammary artery and right leg greater  saphenous vein harvested endoscopically (N/A) TRANSESOPHAGEAL ECHOCARDIOGRAM (TEE) (N/A)  POD 3 Hemodynamically stable in sinus rhythm on IV amio. Will switch to oral amio.  Stage III CKD: creat trending back to baseline of 1.9-2.0 preop.  Volume excess: weight is over preop but will follow for now since creat rising back to baseline. Minimal edema and lungs sound good.  DM: glucose under good control. Will resume glucotrol and continue SSI.  Preop urinary retention: Flomax resumed. Will DC foley today and observe.  Transfer to 4E.     LOS: 9 days    [name] 09/05/2017

## 2017-09-06 LAB — GLUCOSE, CAPILLARY
GLUCOSE-CAPILLARY: 68 mg/dL (ref 65–99)
GLUCOSE-CAPILLARY: 106 mg/dL — AB (ref 65–99)
Glucose-Capillary: 137 mg/dL — ABNORMAL HIGH (ref 65–99)
Glucose-Capillary: 98 mg/dL (ref 65–99)
Glucose-Capillary: 103 mg/dL — ABNORMAL HIGH (ref 65–99)

## 2017-09-06 LAB — BASIC METABOLIC PANEL
Anion gap: 11 (ref 5–15)
BUN: 37 mg/dL — ABNORMAL HIGH (ref 6–20)
CALCIUM: 8.5 mg/dL — AB (ref 8.9–10.3)
CO2: 24 mmol/L (ref 22–32)
CREATININE: 1.97 mg/dL — AB (ref 0.61–1.24)
Chloride: 101 mmol/L (ref 101–111)
GFR, EST AFRICAN AMERICAN: 38 mL/min — AB (ref >60)
GFR, EST NON AFRICAN AMERICAN: 32 mL/min — AB (ref >60)
Glucose, Bld: 111 mg/dL — ABNORMAL HIGH (ref 65–99)
Potassium: 4.5 mmol/L (ref 3.5–5.1)
Sodium: 136 mmol/L (ref 135–145)

## 2017-09-06 LAB — CBC
HCT: 24.8 % — ABNORMAL LOW (ref 39.0–52.0)
Hemoglobin: 8.1 g/dL — ABNORMAL LOW (ref 13.0–17.0)
MCH: 28.8 pg (ref 26.0–34.0)
MCHC: 32.7 g/dL (ref 30.0–36.0)
MCV: 88.3 fL (ref 78.0–100.0)
PLATELETS: 126 10*3/uL — AB (ref 150–400)
RBC: 2.81 MIL/uL — AB (ref 4.22–5.81)
RDW: 14.1 % (ref 11.5–15.5)
WBC: 8.5 10*3/uL (ref 4.0–10.5)

## 2017-09-06 MED ORDER — LACTULOSE 10 GM/15ML PO SOLN
20.0000 g | Freq: Once | ORAL | Status: AC
  Administered 2017-09-06: 20 g via ORAL
  Filled 2017-09-06: qty 30

## 2017-09-06 NOTE — Discharge Instructions (Signed)

## 2017-09-06 NOTE — Progress Notes (Addendum)
      [address]       [place] [address]             [phone]        4 Days Post-Op Procedure(s) (LRB): CORONARY ARTERY BYPASS GRAFTING (CABG) x three , using left internal mammary artery and right leg greater saphenous vein harvested endoscopically (N/A) TRANSESOPHAGEAL ECHOCARDIOGRAM (TEE) (N/A)  Subjective: Patient unable to void and has not had a bowel movement yet.  Objective: Vital signs in last 24 hours: Temp:  [98.1 F (36.7 C)-98.7 F (37.1 C)] 98.2 F (36.8 C) (02/08 0420) Pulse Rate:  [65-76] 76 (02/08 0422) Cardiac Rhythm: Normal sinus rhythm;Bundle branch block (02/07 2115) Resp:  [10-17] 14 (02/08 0422) BP: (105-142)/(57-72) 110/59 (02/08 0422) SpO2:  [95 %-100 %] 98 % (02/08 0422) Weight:  [196 lb 8 oz (89.1 kg)-199 lb 8 oz (90.5 kg)] 196 lb 8 oz (89.1 kg) (02/08 0422)  Pre op weight 82.9  kg Current Weight  09/06/17 196 lb 8 oz (89.1 kg)      Intake/Output from previous day: 02/07 0701 - 02/08 0700 In: 698.2 [P.O.:640; I.V.:58.2] Out: 1775 [Urine:1775]   Physical Exam:  Cardiovascular: RRR Pulmonary: Slightly diminished at bases Abdomen: Soft, non tender, bowel sounds present. Extremities: Trace  bilateral lower extremity edema.  Wounds: Clean and dry.  No erythema or signs of infection.  Lab Results: CBC: Recent Labs    09/05/17 0346 09/06/17 0323  WBC 9.2 8.5  HGB 7.9* 8.1*  HCT 24.5* 24.8*  PLT 115* 126*   BMET:  Recent Labs    09/05/17 0346 09/06/17 0323  NA 136 136  K 4.3 4.5  CL 102 101  CO2 25 24  GLUCOSE 118* 111*  BUN 34* 37*  CREATININE 1.87* 1.97*  CALCIUM 8.2* 8.5*    PT/INR:  Lab Results  Component Value Date   INR 1.50 09/02/2017   INR 1.13 08/30/2017   INR 1.05 07/13/2013   ABG:  INR: Will add last result for INR, ABG once components are confirmed Will add last 4 CBG results once components are confirmed  Assessment/Plan:  1. CV - Previous a fib. Maintaining SR. On Amiodarone  400 mg bid, Coreg 3.125 mg bid, 2.  Pulmonary - On room air. Check PA/LAT CXR in am. Encourage incentive spirometer. 3. CKD (stage III)-creatinine increased to 1.97 (baseline creatinine around 1.9) 4.  Acute blood loss anemia - H and H stable at 8.1 and 24.8 5. GU-urinary retention. On Flomax. Has been in and out cath'd. Will put foley back. 6. Thrombocytopenia-platelets slightly increased to 126,000 7. DM-CBGs 75/96/106. On Glipizide 5 mg daily. Pre op HGA1C 6.4. 8. Remove EPW in am 9. LOC constipation 10. Patient's weight is above pre op, but little LE edema. Will discuss with Dr. Cyndia Bent if should resume diuresis.  Donielle M ZimmermanPA-C 09/06/2017,7:38 AM   Chart reviewed, patient examined, agree with above. He has been hemodynamically stable and maintaining sinus rhythm on amio po. Creat is slightly higher today but at his baseline of 1.9-2. He is diuresing on his own so would hold off on further lasix for now. He has urinary retention with history of BPH on Flomax. Would insert foley and leave in for a couple days. Continue Flomax and do a voiding trial in 48 hrs and if can't void reliably then he needs to go home with catheter with urology followup.

## 2017-09-06 NOTE — Progress Notes (Signed)
@  0030 Pt still unable to void. Bladder scan indicated 94mL urine. Per protocol and pt's request I and O cath performed instead of replacement of foley cath and 91mL urine received.  @0610  Pt still unable to void. Bladder scan indicated 651mL urine. Per protocol 2nd I and O cath performed and 743mL urine received.  In both instance pt endorsed no sensation to void. Will convey to Day RN. Pt educated that if unable to void again, foley would need to be replaced. Of note, Flomax ordered for pt 2 days ago.

## 2017-09-06 NOTE — Discharge Summary (Signed)
Physician Discharge Summary       [address]       [place] [address]             [phone]    Patient ID: Randy [name] MRN: [id number] DOB/AGE: 1945-09-27 72 y.o.  Admit date: 08/27/2017 Discharge date: 09/10/2017  Admission Diagnoses: 1.  Non-ST elevation (NSTEMI) myocardial infarction ([place]) 2. Coronary artery disease  Discharge Diagnoses:  1. S/P CABG x 3 2. Left internal carotid artery stenosis 3. Urinary retention 4. ABL anemia 5. Mild thrombocytopenia 6. Acute exacerbation of CHF (congestive heart failure) ([place]) 7. History of Type 2 diabetes with nephropathy ([place]) 8. History of essential hypertension 9. History of automatic implantable cardioverter-defibrillator in situ - [employer] 10. History of CKD (chronic kidney disease) stage 3, GFR 30-59 ml/min 11. GERD (gastroesophageal reflux disease) 12. History of dyslipidemia 13. History of neuromuscular disorder ([place]) 14. History of sleep apnea 15. History of vitamin D deficiency   Procedure (s):                             *[facility]*                         [address], [place] [address]                            [phone]  ------------------------------------------------------------------- Transthoracic Echocardiography  Patient:    Randy [name], Randy [name] MR #:       [id number] Study Date: 08/27/2017 Gender:     M Age:        17 Height:     170.2 cm Weight:     88 kg BSA:        2.07 m^2 Pt. Status: Room:       4E22C   ATTENDING    Reyne Dumas [id number]  PERFORMING   [facility], Inpatient  SONOGRAPHER  Darlina Sicilian, RDCS  ORDERING     Tamala Julian, Rondell A  REFERRING    Tamala Julian, Rondell A  cc:  ------------------------------------------------------------------- LV EF: 25% -   30%  ------------------------------------------------------------------- Indications:      CHF -  428.0.  ------------------------------------------------------------------- History:   PMH:  Chronic Kidney Disease. Shortness of Breath. Coronary artery disease.  Risk factors:  Hypertension. Diabetes mellitus. Dyslipidemia.  ------------------------------------------------------------------- Study Conclusions  - Left ventricle: The cavity size was normal. Systolic function was   severely reduced. The estimated ejection fraction was in the   range of 25% to 30%. Diffuse hypokinesis. Features are consistent   with a pseudonormal left ventricular filling pattern, with   concomitant abnormal relaxation and increased filling pressure   (grade 2 diastolic dysfunction). Doppler parameters are   consistent with high ventricular filling pressure. - Aortic valve: Transvalvular velocity was within the normal range.   There was no stenosis. There was no regurgitation. - Mitral valve: Transvalvular velocity was within the normal range.   There was no evidence for stenosis. There was trivial   regurgitation. - Left atrium: The atrium was mildly dilated. - Right ventricle: The cavity size was normal. Wall thickness  was   normal. Systolic function was normal. - Tricuspid valve: There was trivial regurgitation. - Pulmonary arteries: Systolic pressure was within the normal   range. PA peak pressure: 18 mm Hg (S).  ------------------------------------------------------------------- Labs, prior tests, procedures, and surgery: ACID.  ------------------------------------------------------------------- Study data:  Comparison was made to the study of 03/13/2017.  Study status:  Routine.  Procedure:  The patient reported no pain pre or post test. Transthoracic echocardiography. Image quality was suboptimal. The study was technically difficult, as a result of poor acoustic windows. Intravenous contrast (Definity) was administered.  Study completion:  There were no complications.      Transthoracic echocardiography.  M-mode, complete 2D, spectral Doppler, and color Doppler.  Birthdate:  Patient birthdate: February 25, 1946.  Age:  Patient is 72 yr old.  Sex:  Gender: male. BMI: 30.4 kg/m^2.  Blood pressure:     112/68  Patient status: Inpatient.  Study date:  Study date: 08/27/2017. Study time: 12:56 PM.  Location:  Emergency department.  -------------------------------------------------------------------  ------------------------------------------------------------------- Left ventricle:  The cavity size was normal. Systolic function was severely reduced. The estimated ejection fraction was in the range of 25% to 30%. Diffuse hypokinesis. Features are consistent with a pseudonormal left ventricular filling pattern, with concomitant abnormal relaxation and increased filling pressure (grade 2 diastolic dysfunction). Doppler parameters are consistent with high ventricular filling pressure.  ------------------------------------------------------------------- Aortic valve:   Trileaflet; mildly thickened, mildly calcified leaflets. Mobility was not restricted.  Doppler:  Transvalvular velocity was within the normal range. There was no stenosis. There was no regurgitation.  ------------------------------------------------------------------- Aorta:  Aortic root: The aortic root was normal in size.  ------------------------------------------------------------------- Mitral valve:   Structurally normal valve.   Mobility was not restricted.  Doppler:  Transvalvular velocity was within the normal range. There was no evidence for stenosis. There was trivial regurgitation.    Peak gradient (D): 3 mm Hg.  ------------------------------------------------------------------- Left atrium:  The atrium was mildly dilated.  ------------------------------------------------------------------- Right ventricle:  The cavity size was normal. Wall thickness was normal. Pacer wire or  catheter noted in right ventricle. Systolic function was normal.  ------------------------------------------------------------------- Pulmonic valve:    Structurally normal valve.   Cusp separation was normal.  Doppler:  Transvalvular velocity was within the normal range. There was no evidence for stenosis. There was no regurgitation.  ------------------------------------------------------------------- Tricuspid valve:   Structurally normal valve.    Doppler: Transvalvular velocity was within the normal range. There was trivial regurgitation.  ------------------------------------------------------------------- Pulmonary artery:   The main pulmonary artery was normal-sized. Systolic pressure was within the normal range.  ------------------------------------------------------------------- Right atrium:  The atrium was normal in size.  ------------------------------------------------------------------- Pericardium:  There was no pericardial effusion.  ------------------------------------------------------------------- Systemic veins: Inferior vena cava: The vessel was normal in size. The respirophasic diameter changes were blunted (< 50%), consistent with normal central venous pressure  LEFT HEART CATH AND CORONARY ANGIOGRAPHY by Dr. Martinique on 08/30/2017:  Conclusion     Prox RCA lesion is 90% stenosed.  Mid RCA lesion is 100% stenosed.  Ost LM to Mid LM lesion is 85% stenosed.  Ost LAD to Prox LAD lesion is 50% stenosed.  Ost 1st Diag to 1st Diag lesion is 70% stenosed.  Ost Ramus to Ramus lesion is 75% stenosed.  Ost Cx to Prox Cx lesion is 99% stenosed.  Ost 1st Mrg lesion is 100% stenosed.  LV end diastolic pressure is normal.   1. Severe 3 vessel and left main obstructive CAD 2. Normal LVEDP  Plan: will consult  CT surgery for CABG    1. Median Sternotomy 2. Extracorporeal circulation 3.   Coronary artery bypass grafting x 3   Left internal  mammary graft to the LAD  SVG to Ramus  SVG to PDA  4.   Endoscopic vein harvest from the right leg by Dr. Cyndia Bent on 09/02/2017.   History of Presenting Illness: The patient is a 72 year old gentleman with history of hypertension, dyslipidemia, type 2 diabetes, stage III chronic kidney disease with a creatinine of around 2, remote myocardial infarction with ischemic cardiomyopathy and an ejection fraction of 35% by echo in March 2018.  His cardiac history dates back to 2009 when he presented with shortness of breath and underwent a nuclear stress test which showed an ejection fraction of 32%.  There was evidence of prior inferolateral infarct with mild peri-infarct ischemia.  He had a cardiac catheterization at that time which showed an occluded proximal to mid right coronary artery with bridging collaterals filling the distal vessel.  There is mild left main narrowing.  He had a single chamber internal defibrillator placed by Dr. Lovena Le and has been followed by him since.  He underwent upgrade to a dual-chamber ICD with insertion of new atrial pacing lead in March 2018.  He reports having 2 shocks in November 2018 while playing golf.  Both shocks occurred during one round of golf and he had dizziness and felt poorly prior to each shock.  He reports having some chronic shortness of breath with exertion but has remained relatively active.  He has continued to play golf.  He is playing golf last Sunday and walk down a long hill and back up the hill with significant shortness of breath and then felt poorly.  He sat down his golf cart and put his head down and was shocked by the defibrillator.  He had a second shock soon after that.  He denies any dizziness prior to these shocks.  He has never had any chest discomfort.  His wife reports that he does get short of breath with any significant activity and particularly when bending over.  He was admitted on 08/27/2017 with positive troponins peaking at 11.  An  echocardiogram showed a drop in his ejection fraction to 25-30% with global hypokinesis.  There was no significant valvular disease.  Interrogation of his internal defibrillator showed that he had 3 episodes of ventricular tachycardia prior to admission 1 that was treated with antitachycardia pacing and tubes were treated with ICD shocks.  He was started on IV amiodarone.  He underwent cardiac catheterization earlier today that shows a diffusely diseased left main with 85% stenosis.  The ostial and proximal LAD has 50% stenosis.  There is a moderate size ramus branch that is 75% stenosis.  The left circumflex is a small vessel with 99% ostial stenosis.  The first marginal was occluded.  The right coronary artery has 90% proximal stenosis followed by complete occlusion in the midportion with bridging collaterals filling the distal vessel.  He had normal LVEDP.  The amiodarone was stopped because the arrhythmias were felt to be ischemic in nature.  This 72 year old gentleman has high-grade left main coronary stenosis with a small nondominant left circumflex that has an occluded obtuse marginal branch and occlusion of the mid right coronary artery with filling of the small distal dominant right coronary artery by bridging collaterals.  Dr. Cyndia Bent personally reviewed his cardiac catheterization films and compare them to the films in 2009 and the only significant change is  that his left main is now severely diseased.  This is the predominant source of blood supply to his heart.  He has ischemic cardiomyopathy with ejection fraction of 25% which is down from 35% last March.  Dr. Cyndia Bent agreed that coronary artery bypass graft surgery is the best treatment for this patient. Potential risks, benefits, and complications of the surgery were discussed with the patient and he agreed to proceed with surgery. Pre operative carotid duplex US showedno significant right internal carotid artery stenosis and 60-79% left internal  carotid artery stenosis. He underwent a CABG x 3 on 09/02/2017.   Brief Hospital Course:  The patient was extubated the evening of surgery without difficulty. He was initially AAI paced. He remained afebrile and hemodynamically stable. Gordy Councilman, a line, chest tubes, and foley were removed early in the post operative course. Coreg was started and titrated accordingly. He was volume over loaded and diuresed. He had ABL anemia. He did not require a post op transfusion. Last H and H was 8.1 and 24.8. He also had mild thrombocytopenia. His last platelet count was 126,000. He was weaned off the insulin drip.  Once he was tolerating a diet, Glipizide was restarted.  The patient's glucose remained well controlled.The patient's HGA1C pre op was  6.4. The patient was felt surgically stable for transfer from the ICU to PCTU for further convalescence on 09/05/2016. He had urinary retention. He was started on Flomax and foley was re inserted 09/06/2017. He had a voiding trial 02/10. He was NOT able to void on his own. Foley was reinserted and a urology appointment was made for follow up. Also, he had orthostasis over this past weekend. It was likely a combination of auto diuresing (as not on Lasix), Coreg, and possibly Flomax. Flomax and Coreg were stopped and Proscar was started. He continues to progress with cardiac rehab. He was ambulating on room air. He has been tolerating a diet and has had a bowel movement. Epicardial pacing wires were removed on 09/07/2017. Chest tube sutures will be removed in the office after discharge. His blood pressure has improved. The patient is felt surgically stable for discharge today.   Latest Vital Signs: Blood pressure 120/62, pulse 76, temperature 98.4 F (36.9 C), temperature source Oral, resp. rate 17, height 5\' 7"  (1.702 m), weight 183 lb 1.6 oz (83.1 kg), SpO2 97 %.  Physical Exam: Cardiovascular: RRR Pulmonary: Mostly clear Abdomen: Soft, non tender, bowel sounds  present. Extremities: No lower extremity edema.  Wounds: Clean and dry.  No erythema or signs of infection.     Discharge Condition: Stable and discharged to home.  Recent laboratory studies:  Lab Results  Component Value Date   WBC 8.5 09/06/2017   HGB 8.1 (L) 09/06/2017   HCT 24.8 (L) 09/06/2017   MCV 88.3 09/06/2017   PLT 126 (L) 09/06/2017   Lab Results  Component Value Date   NA 134 (L) 09/07/2017   K 3.9 09/07/2017   CL 100 (L) 09/07/2017   CO2 24 09/07/2017   CREATININE 1.79 (H) 09/07/2017   GLUCOSE 121 (H) 09/07/2017     Diagnostic Studies: Dg Chest 2 View  Result Date: 09/07/2017 CLINICAL DATA:  Pneumothorax, CABG EXAM: CHEST  2 VIEW COMPARISON:  09/04/2017 FINDINGS: Left pacer remains in place, unchanged. Prior CABG. Heart is normal size. No confluent airspace opacities. Small effusions noted on the lateral view. No pneumothorax. IMPRESSION: Small bilateral effusions.  No pneumothorax. Electronically Signed   By: Rolm Baptise M.D.  On: 09/07/2017 09:20   Vas US Doppler Pre Cabg  Result Date: 09/01/2017 PERIOPERATIVE VASCULAR EVALUATION Indications:  Pre-surgical evaluation. Risk Factors: Hypertension, hyperlipidemia, non insulin dependent diabetes               mellitus, prior MI.  Examination Guidelines: A complete evaluation includes B-mode imaging, spectral doppler, color doppler, and power doppler as needed of all accessible portions of each vessel. Bilateral testing is considered an integral part of a complete examination. Limited examinations for reoccurring indications may be performed as noted.  Right Carotid Findings: +----------+-------+-------+--------+---------------------------------+--------+           PSV    EDV    StenosisDescribe                         Comments           cm/s   cm/s                                                     +----------+-------+-------+--------+---------------------------------+--------+ CCA Prox  95     14                                                        +----------+-------+-------+--------+---------------------------------+--------+ CCA Distal106    21                                                       +----------+-------+-------+--------+---------------------------------+--------+ ICA Prox  -66    -19            irregular, heterogenous and                                               calcific                                  +----------+-------+-------+--------+---------------------------------+--------+ ICA Distal-123   -28                                                      +----------+-------+-------+--------+---------------------------------+--------+ ECA       -72    -5                                                       +----------+-------+-------+--------+---------------------------------+--------+ Portions of this table do not appear on this page. +----------+--------+-------+----------------+------------+           PSV cm/sEDV cmsDescribe        Arm Pressure +----------+--------+-------+----------------+------------+ Subclavian90  Multiphasic, [id number]          +----------+--------+-------+----------------+------------+ +---------+--------+--+--------+--+---------+ VertebralPSV cm/s39EDV cm/s11Antegrade +---------+--------+--+--------+--+---------+ Left Carotid Findings: +----------+--------+--------+--------+---------------------+------------------+           PSV cm/sEDV cm/sStenosisDescribe             Comments           +----------+--------+--------+--------+---------------------+------------------+ CCA Prox  117     21                                                      +----------+--------+--------+--------+---------------------+------------------+ CCA Distal112     25                                   intimal thickening +----------+--------+--------+--------+---------------------+------------------+ ICA  Prox  -93     -32             smooth, heterogenous                                                      and calcific                            +----------+--------+--------+--------+---------------------+------------------+ ICA Mid   -303    -71             smooth, heterogenous                                                      and calcific                            +----------+--------+--------+--------+---------------------+------------------+ ICA Distal-256    -61                                                     +----------+--------+--------+--------+---------------------+------------------+ ECA       -71     -10                                                     +----------+--------+--------+--------+---------------------+------------------+ +----------+--------+--------+----------------+------------+ SubclavianPSV cm/sEDV cm/sDescribe        Arm Pressure +----------+--------+--------+----------------+------------+           147             Multiphasic, WNL             +----------+--------+--------+----------------+------------+ +---------+--------+--+--------+-+---------+ VertebralPSV cm/s41EDV cm/s9Antegrade +---------+--------+--+--------+-+---------+  ABI Findings: +--------+------------------+-----+---------+--------+ Right   Rt Pressure (mmHg)IndexWaveform Comment  +--------+------------------+-----+---------+--------+ [id number]                    triphasic         +--------+------------------+-----+---------+--------+  PTA     143               1.24 biphasic          +--------+------------------+-----+---------+--------+ DP      188               1.63 triphasic         +--------+------------------+-----+---------+--------+ +--------+------------------+-----+---------+----------------------------------+ Left    Lt Pressure (mmHg)IndexWaveform Comment                             +--------+------------------+-----+---------+----------------------------------+ Brachial                       triphasicUnable to obtain pressure due to                                           IV location                        +--------+------------------+-----+---------+----------------------------------+ PTA     122               1.06 biphasic                                    +--------+------------------+-----+---------+----------------------------------+ DP      139               1.21 triphasic                                   +--------+------------------+-----+---------+----------------------------------+ +-------+---------------+----------------+ ABI/TBIToday's ABI/TBIPrevious ABI/TBI +-------+---------------+----------------+ Right  1.63                            +-------+---------------+----------------+ Left   1.21                            +-------+---------------+----------------+  Upper Extremity Doppler Findings: +------------+---------+----------+----------+----------+---------+------------+ Comments-RT Doppler-RPressure-R   Site   Pressure-LDoppler-LComments-LT              T        T                   T         T                     +------------+---------+----------+----------+----------+---------+------------+                                Subclavian                                +------------+---------+----------+----------+----------+---------+------------+                                 Axillary                                 +------------+---------+----------+----------+----------+---------+------------+  triphasic115        Brachial           triphasicUnable to                                                                obtain                                                                   pressure due                                                             to IV                                                                     location     +------------+---------+----------+----------+----------+---------+------------+                                 Forearm                                  +------------+---------+----------+----------+----------+---------+------------+             triphasic            Radial            triphasic             +------------+---------+----------+----------+----------+---------+------------+             triphasic            Ulnar             triphasic             +------------+---------+----------+----------+----------+---------+------------+ Signal                           Palmar                     Signal       obliterates                       Arch                      obliterates  with radial  with radial  compression,                                                compression, decreases                                                   decreases    >50% with                                                   50% with     ulnar                                                       ulnar        compression.                                                compression. +------------+---------+----------+----------+----------+---------+------------+                                  Digit                                   +------------+---------+----------+----------+----------+---------+------------+  Final Interpretation: Right Carotid: Velocities in the right ICA are consistent with a 1-39% stenosis. Left Carotid: Velocities in the left ICA are consistent with a 60-79% stenosis. Vertebrals:  Both vertebral arteries were patent with antegrade flow. Subclavians: Normal flow hemodynamics were seen in bilateral subclavian              arteries. Right ABI: Elevated resting right ankle-brachial index  indicates possible medial calcification of the right lower extremity arteries. Left ABI: Resting left ankle-brachial index is within normal range. No evidence of significant left lower extremity arterial disease.  Electronically signed by Curt Jews on 09/01/2017 at 9:07:50 AM.  Final   Discharge Instructions    Amb Referral to Cardiac Rehabilitation   Complete by:  As directed    Diagnosis:   CABG NSTEMI     CABG X ___:  3      Discharge Medications: Allergies as of 09/10/2017   No Known Allergies     Medication List    STOP taking these medications   carvedilol 12.5 MG tablet Commonly known as:  COREG   furosemide 20 MG tablet Commonly known as:  LASIX   lisinopril 2.5 MG tablet Commonly known as:  PRINIVIL,ZESTRIL   lovastatin 20 MG tablet Commonly known as:  MEVACOR Replaced by:  atorvastatin 80 MG tablet   meloxicam 15 MG tablet Commonly known as:  MOBIC   tamsulosin 0.4 MG Caps capsule Commonly known as:  FLOMAX  TAKE these medications   ACCU-CHEK AVIVA device Use to check blood sugar once daily.  Dx: E11.21   ACCU-CHEK AVIVA Soln Use to calibrate blood glucose machine as recommended.  Diagnosis:  E11.21  Non-insulin dependent.   ACCU-CHEK SOFTCLIX LANCETS lancets Use as instructed to check blood sugar once daily and as needed.  Diagnosis:  E11.21  Non- insulin dependent.   acetaminophen 325 MG tablet Commonly known as:  TYLENOL Take 2 tablets (650 mg total) by mouth every 6 (six) hours as needed for mild pain.   albuterol 108 (90 Base) MCG/ACT inhaler Commonly known as:  PROVENTIL HFA;VENTOLIN HFA Inhale 2 puffs into the lungs every 6 (six) hours as needed for wheezing or shortness of breath.   amiodarone 200 MG tablet Commonly known as:  PACERONE Take 1 tablet (200 mg total) by mouth daily.   aspirin EC 325 MG tablet Take 1 tablet (325 mg total) by mouth daily. What changed:    medication strength  how much to take   atorvastatin 80 MG  tablet Commonly known as:  LIPITOR Take 1 tablet (80 mg total) by mouth daily at 6 PM. Replaces:  lovastatin 20 MG tablet   B-D SINGLE USE SWABS REGULAR Pads Use as instructed to clean area for glucose monitoring once daily and as needed.  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   gabapentin 300 MG capsule Commonly known as:  NEURONTIN Take 2 capsules (600 mg total) by mouth 2 (two) times daily.   glipiZIDE 5 MG tablet Commonly known as:  GLUCOTROL TAKE 1 TABLET EVERY DAY BEFORE BREAKFAST   glucose blood test strip Commonly known as:  ACCU-CHEK AVIVA PLUS Test Blood Sugar 1 time daily: Dx:E11.27   traMADol 50 MG tablet Commonly known as:  ULTRAM Take 1 tablet (50 mg total) by mouth every 6 (six) hours as needed for moderate pain.      The patient has been discharged on:   1.Beta Blocker:  Yes [   ]                              No   [  x ]                              If No, reason:Labile BP  2.Ace Inhibitor/ARB: Yes [   ]                                     No  [   x ]                                     If No, reason:Labile BP  3.Statin:   Yes [ x  ]                  No  [   ]                  If No, reason:  4.Ecasa:  Yes  [ x  ]  No   [   ]                  If No, reason:  Follow Up Appointments: Follow-up Information    Evans Lance, MD. Call.   Specialty:  Cardiology Why:  for a follow up appointment for 2 weeks Contact information: [address] [place] [address] [phone]        [name], MD. Go on 10/09/2017.   Specialty:  Cardiothoracic Surgery Why:  PA/LAT CXR to be taken (at [facility] which is in the same building as Dr. Vivi Martens office on 10/09/2017 at 10:30 am;Appointment time is at 11:00 am Contact information: [address] [place] [address] [phone]         Nurse. Go on 09/13/2017.   Why:  Appointment time is at 9:45 am Contact information: [address] [place] [address]       Ceasar Mons, MD Follow up.   Specialty:  Urology Why:  08/19/2017 at 10:45 am to follow-up with urologist because of urinary retention and foley catheter Contact information: [address] [place] [address] [phone]           Signed: Sharalyn Ink [facility] 09/10/2017, 7:43 AM

## 2017-09-06 NOTE — Progress Notes (Signed)
Foley catheter inserted by Luetta Nutting, RN with Creta Levin, RN per physician order for acute urinary retention.

## 2017-09-06 NOTE — Progress Notes (Signed)
CARDIAC REHAB PHASE I   PRE:  Rate/Rhythm: 72 SR  BP:  Supine: 114/57  Sitting:   Standing:    SaO2: 99%RA  MODE:  Ambulation: 350 ft   POST:  Rate/Rhythm: 84 SR  BP:  Supine:   Sitting: 107/42  Standing:    SaO2: 94%RA 1020-1043 Pt walked 350 ft on RA with rolling walker and minimal asst. Tolerated well. No recliner with call bell. Encouraged two more walks today.   Graylon Good, RN BSN  09/06/2017 10:39 AM

## 2017-09-07 ENCOUNTER — Inpatient Hospital Stay (HOSPITAL_COMMUNITY): Payer: Medicare HMO

## 2017-09-07 LAB — BASIC METABOLIC PANEL
Anion gap: 10 (ref 5–15)
BUN: 38 mg/dL — AB (ref 6–20)
CO2: 24 mmol/L (ref 22–32)
CREATININE: 1.79 mg/dL — AB (ref 0.61–1.24)
Calcium: 8.3 mg/dL — ABNORMAL LOW (ref 8.9–10.3)
Chloride: 100 mmol/L — ABNORMAL LOW (ref 101–111)
GFR calc Af Amer: 42 mL/min — ABNORMAL LOW (ref >60)
GFR calc non Af Amer: 36 mL/min — ABNORMAL LOW (ref >60)
GLUCOSE: 121 mg/dL — AB (ref 65–99)
POTASSIUM: 3.9 mmol/L (ref 3.5–5.1)
Sodium: 134 mmol/L — ABNORMAL LOW (ref 135–145)

## 2017-09-07 LAB — GLUCOSE, CAPILLARY
GLUCOSE-CAPILLARY: 108 mg/dL — AB (ref 65–99)
Glucose-Capillary: 115 mg/dL — ABNORMAL HIGH (ref 65–99)
Glucose-Capillary: 116 mg/dL — ABNORMAL HIGH (ref 65–99)
Glucose-Capillary: 135 mg/dL — ABNORMAL HIGH (ref 65–99)

## 2017-09-07 MED ORDER — AMIODARONE HCL 200 MG PO TABS
200.0000 mg | ORAL_TABLET | Freq: Every day | ORAL | Status: DC
  Administered 2017-09-08 – 2017-09-10 (×3): 200 mg via ORAL
  Filled 2017-09-07 (×3): qty 1

## 2017-09-07 MED ORDER — POTASSIUM CHLORIDE CRYS ER 20 MEQ PO TBCR
20.0000 meq | EXTENDED_RELEASE_TABLET | Freq: Once | ORAL | Status: AC
  Administered 2017-09-07: 20 meq via ORAL
  Filled 2017-09-07: qty 1

## 2017-09-07 MED ORDER — AMIODARONE HCL 200 MG PO TABS
200.0000 mg | ORAL_TABLET | Freq: Two times a day (BID) | ORAL | Status: DC
  Administered 2017-09-07: 200 mg via ORAL
  Filled 2017-09-07: qty 1

## 2017-09-07 MED ORDER — FUROSEMIDE 40 MG PO TABS
40.0000 mg | ORAL_TABLET | Freq: Once | ORAL | Status: AC
  Administered 2017-09-07: 40 mg via ORAL
  Filled 2017-09-07: qty 1

## 2017-09-07 NOTE — Progress Notes (Signed)
Cardiac Rehab nurse ambulated pt, who had an episode of orthostatic hypotension BP 77/56 (0946).  Patient was asymptomatic, and BP upon lying supine was 95/59 (0950).  Notified Lars Pinks, PA by page who will evaluate patient.  Will continue continue to monitor pt for hypotension.

## 2017-09-07 NOTE — Progress Notes (Signed)
Nurse informed me patient's BP more labile and orthostatic. Coreg held and given parameters, Amiodarone decreased to daily, and diuresis stopped. Monitor

## 2017-09-07 NOTE — Progress Notes (Signed)
CARDIAC REHAB PHASE I   PRE:  Rate/Rhythm: 74 SR  BP:  Supine: 87/54  Sitting: 92/60  Standing: 77/56   SaO2: 100% RA  MODE:  Ambulation: 0 ft   POST:  Rate/Rhythm: 78 SR  BP:  Supine: 95/59  Sitting:   Standing:    SaO2: 100% RA Did not ambulate patient due to orthostatic hypotension.  BP checked lying, sitting, and standing, see above documentation.No complaints of dizziness, placed back in bed reported to his nurse, Lattie Haw.  On the other hand education was completed re: sternal precautions, signs and symptoms of infection, IS usage when home, activity progression after discharge, nutrition tips, referred to phase II cardiac rehab in [place]. [phone]  Liliane Channel RN, BSN 09/07/2017 10:12 AM

## 2017-09-07 NOTE — Progress Notes (Addendum)
      [address]       [place] [address]             [phone]        5 Days Post-Op Procedure(s) (LRB): CORONARY ARTERY BYPASS GRAFTING (CABG) x three , using left internal mammary artery and right leg greater saphenous vein harvested endoscopically (N/A) TRANSESOPHAGEAL ECHOCARDIOGRAM (TEE) (N/A)  Subjective: Patient had bowel movement yesterday. No specific complaints this am.  Objective: Vital signs in last 24 hours: Temp:  [97.8 F (36.6 C)-98.7 F (37.1 C)] 97.9 F (36.6 C) (02/09 0544) Pulse Rate:  [67-76] 68 (02/09 0544) Cardiac Rhythm: Normal sinus rhythm (02/08 1939) Resp:  [12-20] 12 (02/09 0544) BP: (96-136)/(55-82) 96/55 (02/09 0544) SpO2:  [95 %-100 %] 99 % (02/09 0544) Weight:  [192 lb 8 oz (87.3 kg)] 192 lb 8 oz (87.3 kg) (02/09 0551)  Pre op weight 82.9  kg Current Weight  09/07/17 192 lb 8 oz (87.3 kg)      Intake/Output from previous day: 02/08 0701 - 02/09 0700 In: -  Out: 1450 [Urine:1450]   Physical Exam:  Cardiovascular: RRR Pulmonary: Slightly diminished at bases Abdomen: Soft, non tender, bowel sounds present. Extremities: Trace  bilateral lower extremity edema.  Wounds: Clean and dry.  No erythema or signs of infection.  Lab Results: CBC: Recent Labs    09/05/17 0346 09/06/17 0323  WBC 9.2 8.5  HGB 7.9* 8.1*  HCT 24.5* 24.8*  PLT 115* 126*   BMET:  Recent Labs    09/06/17 0323 09/07/17 0225  NA 136 134*  K 4.5 3.9  CL 101 100*  CO2 24 24  GLUCOSE 111* 121*  BUN 37* 38*  CREATININE 1.97* 1.79*  CALCIUM 8.5* 8.3*    PT/INR:  Lab Results  Component Value Date   INR 1.50 09/02/2017   INR 1.13 08/30/2017   INR 1.05 07/13/2013   ABG:  INR: Will add last result for INR, ABG once components are confirmed Will add last 4 CBG results once components are confirmed  Assessment/Plan:  1. CV - Previous a fib. Maintaining SR with HR in the 60-70's. On Amiodarone 400 mg bid, Coreg 3.125 mg bid.  Will decrease Amiodarone to 200 mg bid. 2.  Pulmonary - On room air. CXR ordered but not taken yet. Encourage incentive spirometer. 3. CKD (stage III)-creatinine increased to 1.79 (baseline creatinine around 1.9) 4.  Acute blood loss anemia - H and H stable at 8.1 and 24.8 5. GU-urinary retention. On Flomax. Foley re inserted yesterday. Will try voiding trial in am. 6. Thrombocytopenia-last platelets slightly increased to 126,000 7. DM-CBGs 137/103/115. On Glipizide 5 mg daily. Pre op HGA1C 6.4. 8. Remove EPW  9. Supplement potassium 10. Possibly discharge in am  Donielle M ZimmermanPA-C 09/07/2017,7:34 AM   hold bp meds  Foley remains in place  I have seen and examined Randy [name] and agree with the above assessment  and plan.  Grace Isaac MD Beeper [phone] Office [phone] 09/07/2017 2:59 PM

## 2017-09-08 LAB — GLUCOSE, CAPILLARY
GLUCOSE-CAPILLARY: 112 mg/dL — AB (ref 65–99)
GLUCOSE-CAPILLARY: 123 mg/dL — AB (ref 65–99)
Glucose-Capillary: 90 mg/dL (ref 65–99)
Glucose-Capillary: 101 mg/dL — ABNORMAL HIGH (ref 65–99)

## 2017-09-08 MED ORDER — ASPIRIN EC 325 MG PO TBEC: 325.0000 mg | DELAYED_RELEASE_TABLET | Freq: Every day | ORAL | Status: DC

## 2017-09-08 MED ORDER — ACETAMINOPHEN 325 MG PO TABS: 650.0000 mg | ORAL_TABLET | Freq: Four times a day (QID) | ORAL | Status: DC | PRN

## 2017-09-08 MED ORDER — TRAMADOL HCL 50 MG PO TABS: 50.0000 mg | ORAL_TABLET | Freq: Four times a day (QID) | ORAL | 0 refills | Status: DC | PRN

## 2017-09-08 MED ORDER — CARVEDILOL 3.125 MG PO TABS: ORAL_TABLET | ORAL | 1 refills | Status: DC

## 2017-09-08 MED ORDER — AMIODARONE HCL 200 MG PO TABS: 200.0000 mg | ORAL_TABLET | Freq: Every day | ORAL | 1 refills | Status: DC

## 2017-09-08 MED ORDER — ATORVASTATIN CALCIUM 80 MG PO TABS: 80.0000 mg | ORAL_TABLET | Freq: Every day | ORAL | 1 refills | Status: DC

## 2017-09-08 NOTE — Progress Notes (Signed)
Pt unable to safely ambulate in hallway due to BP dropping. Pt ambulated short distances in room. Pt resting in bed.

## 2017-09-08 NOTE — Progress Notes (Signed)
Nurse tech attempted to ambulate pt. Pt's BP dropped to 69/52 while standing. Pt seated back on bed and felt comfortable to ambulate to sink and back to bed. Pt's BP came up to 95/61. Will continue to monitor.   Grant Fontana BSN, RN

## 2017-09-08 NOTE — Progress Notes (Addendum)
      [address]       [place] [address]             [phone]        6 Days Post-Op Procedure(s) (LRB): CORONARY ARTERY BYPASS GRAFTING (CABG) x three , using left internal mammary artery and right leg greater saphenous vein harvested endoscopically (N/A) TRANSESOPHAGEAL ECHOCARDIOGRAM (TEE) (N/A)  Subjective: Patient sleeping-awakened. He states his "bag was full of urine yesterday". No complaints this am.  Objective: Vital signs in last 24 hours: Temp:  [97.8 F (36.6 C)-98.5 F (36.9 C)] 98.2 F (36.8 C) (02/10 0500) Pulse Rate:  [28-77] 74 (02/10 0000) Cardiac Rhythm: Normal sinus rhythm (02/09 2130) Resp:  [15-21] 15 (02/10 0000) BP: (79-129)/(47-67) 129/59 (02/10 0000) SpO2:  [95 %-100 %] 98 % (02/10 0500) Weight:  [187 lb 11.2 oz (85.1 kg)] 187 lb 11.2 oz (85.1 kg) (02/10 0500)  Pre op weight 82.9  kg Current Weight  09/08/17 187 lb 11.2 oz (85.1 kg)      Intake/Output from previous day: 02/09 0701 - 02/10 0700 In: -  Out: 2200 [Urine:2200]   Physical Exam:  Cardiovascular: RRR Pulmonary: Slightly diminished at bases Abdomen: Soft, non tender, bowel sounds present. Extremities: Trace  bilateral lower extremity edema.  Wounds: Clean and dry.  No erythema or signs of infection.  Lab Results: CBC: Recent Labs    09/06/17 0323  WBC 8.5  HGB 8.1*  HCT 24.8*  PLT 126*   BMET:  Recent Labs    09/06/17 0323 09/07/17 0225  NA 136 134*  K 4.5 3.9  CL 101 100*  CO2 24 24  GLUCOSE 111* 121*  BUN 37* 38*  CREATININE 1.97* 1.79*  CALCIUM 8.5* 8.3*    PT/INR:  Lab Results  Component Value Date   INR 1.50 09/02/2017   INR 1.13 08/30/2017   INR 1.05 07/13/2013   ABG:  INR: Will add last result for INR, ABG once components are confirmed Will add last 4 CBG results once components are confirmed  Assessment/Plan:  1. CV - Previous a fib. Maintaining SR with HR in the 70's. Had ortho stasis yesterday but was not  symptomatic. He did not get Coreg yesterday. On Amiodarone 200 mg bid, Coreg 3.125 mg bid. BP improved this am.  2.  Pulmonary - On room air. CXR ordered but not taken yet. Encourage incentive spirometer. 3. CKD (stage III)-creatinine increased to 1.79 (baseline creatinine around 1.9) 4.  Acute blood loss anemia - Last H and H stable at 8.1 and 24.8 5. GU-urinary retention. On Flomax. Foley re inserted yesterday. Will try voiding trial. Bladder scan in 6 hours;if not able to void, replace foley. Will have to arrange for urology follow up if he goes home with catheter 6. Thrombocytopenia-last platelets slightly increased to 126,000 7. DM-CBGs 108/135/112. On Glipizide 5 mg daily. Pre op HGA1C 6.4. 8. Volume overloaded according to his weight. He became ortho static after Lasix yesterday and appears to be diuresing fairly well on own. Will not give anymore Lasix. 9. Chest tube sutures to remain-will remove in office after discharge. 10. Discharge in am  Donielle M ZimmermanPA-C 09/08/2017,7:12 AM  Voiding trail today Foley currently out , monitor voiding before d/c  I have seen and examined [name] and agree with the above assessment  and plan.  Grace Isaac MD Beeper [phone] Office [phone] 09/08/2017 12:41 PM

## 2017-09-09 LAB — GLUCOSE, CAPILLARY
GLUCOSE-CAPILLARY: 116 mg/dL — AB (ref 65–99)
GLUCOSE-CAPILLARY: 153 mg/dL — AB (ref 65–99)
GLUCOSE-CAPILLARY: 87 mg/dL (ref 65–99)
Glucose-Capillary: 127 mg/dL — ABNORMAL HIGH (ref 65–99)

## 2017-09-09 MED ORDER — FINASTERIDE 5 MG PO TABS
5.0000 mg | ORAL_TABLET | Freq: Every day | ORAL | Status: DC
  Administered 2017-09-09 – 2017-09-10 (×2): 5 mg via ORAL
  Filled 2017-09-09 (×2): qty 1

## 2017-09-09 MED ORDER — LEVALBUTEROL HCL 0.63 MG/3ML IN NEBU: 0.6300 mg | INHALATION_SOLUTION | Freq: Four times a day (QID) | RESPIRATORY_TRACT | Status: DC | PRN

## 2017-09-09 MED ORDER — LEVALBUTEROL HCL 0.63 MG/3ML IN NEBU
0.6300 mg | INHALATION_SOLUTION | Freq: Three times a day (TID) | RESPIRATORY_TRACT | Status: DC
  Administered 2017-09-09: 0.63 mg via RESPIRATORY_TRACT
  Filled 2017-09-09: qty 3

## 2017-09-09 NOTE — Care Management Important Message (Signed)
Important Message  Patient Details  Name: [name] MRN: [id number] Date of Birth: May 02, 1946   Medicare Important Message Given:  Yes    Keny Donald 09/09/2017, 1:20 PM

## 2017-09-09 NOTE — Progress Notes (Addendum)
[address]       [place] [address]             [phone]      7 Days Post-Op Procedure(s) (LRB): CORONARY ARTERY BYPASS GRAFTING (CABG) x three , using left internal mammary artery and right leg greater saphenous vein harvested endoscopically (N/A) TRANSESOPHAGEAL ECHOCARDIOGRAM (TEE) (N/A) Subjective: Feels ok, still with some orthostatic hypotension issues, also foley remains in place Objective: Vital signs in last 24 hours: Temp:  [98.3 F (36.8 C)] 98.3 F (36.8 C) (02/10 1957) Pulse Rate:  [69-76] 73 (02/11 0401) Cardiac Rhythm: Normal sinus rhythm (02/11 0401) Resp:  [14-18] 18 (02/11 0401) BP: (69-110)/(52-74) 103/73 (02/11 0401) SpO2:  [98 %-99 %] 98 % (02/11 0401) Weight:  [185 lb 1.6 oz (84 kg)] 185 lb 1.6 oz (84 kg) (02/11 0412)  Hemodynamic parameters for last 24 hours:    Intake/Output from previous day: 02/10 0701 - 02/11 0700 In: 240 [P.O.:240] Out: 1900 [Urine:1900] Intake/Output this shift: No intake/output data recorded.  General appearance: alert, cooperative and no distress Heart: regular rate and rhythm Lungs: + scattered insp/exp wheezes Abdomen: benign exam Extremities: no edema Wound: incis healing well  Lab Results: No results for input(s): WBC, HGB, HCT, PLT in the last 72 hours. BMET:  Recent Labs    09/07/17 0225  NA 134*  K 3.9  CL 100*  CO2 24  GLUCOSE 121*  BUN 38*  CREATININE 1.79*  CALCIUM 8.3*    PT/INR: No results for input(s): LABPROT, INR in the last 72 hours. ABG    Component Value Date/Time   PHART 7.359 09/03/2017 0924   HCO3 22.6 09/03/2017 0924   TCO2 23 09/03/2017 1646   ACIDBASEDEF 3.0 (H) 09/03/2017 0924   O2SAT 98.0 09/03/2017 0924   CBG (last 3)  Recent Labs    09/08/17 1631 09/08/17 2131 09/09/17 0636  GLUCAP 90 101* 116*    Meds Scheduled Meds: . amiodarone  200 mg Oral Daily  . aspirin EC  325 mg Oral Daily  . atorvastatin  80 mg Oral q1800  . carvedilol   3.125 mg Oral BID WC  . docusate sodium  200 mg Oral Daily  . enoxaparin (LOVENOX) injection  30 mg Subcutaneous QHS  . gabapentin  600 mg Oral BID  . glipiZIDE  5 mg Oral QAC breakfast  . insulin aspart  0-24 Units Subcutaneous TID AC & HS  . pantoprazole  40 mg Oral QAC breakfast  . sodium chloride flush  3 mL Intravenous Q12H  . tamsulosin  0.4 mg Oral Daily   Continuous Infusions: . sodium chloride     PRN Meds:.sodium chloride, acetaminophen, bisacodyl **OR** bisacodyl, levalbuterol, ondansetron **OR** ondansetron (ZOFRAN) IV, oxyCODONE, sodium chloride flush  Xrays Dg Chest 2 View  Result Date: 09/07/2017 CLINICAL DATA:  Pneumothorax, CABG EXAM: CHEST  2 VIEW COMPARISON:  09/04/2017 FINDINGS: Left pacer remains in place, unchanged. Prior CABG. Heart is normal size. No confluent airspace opacities. Small effusions noted on the lateral view. No pneumothorax. IMPRESSION: Small bilateral effusions.  No pneumothorax. Electronically Signed   By: Rolm Baptise M.D.   On: 09/07/2017 09:20    Assessment/Plan: S/P Procedure(s) (LRB): CORONARY ARTERY BYPASS GRAFTING (CABG) x three , using left internal mammary artery and right leg greater saphenous vein harvested endoscopically (N/A) TRANSESOPHAGEAL ECHOCARDIOGRAM (TEE) (N/A)  1 overall doing well, in sinus rhythm 2 orthostatic HTN, prob related to flomax, will change to proscar- will need  to arrange urology f/u. If cont to be orthostatic will possibly need IVF 3 Blood sugars well controlled 4 no new labs today 5 wheezing,  xopenex - change from prn to scheduled for now- pulm toilet/rehab  LOS: 13 days    John Giovanni 09/09/2017   Chart reviewed, patient examined, agree with above. Orthostatic this am because he put out a lot of urine with lasix yesterday. His kidneys are in recovery phase after surgery. Encouraged to drink more today. Will stop Coreg for now. Flomax switched to Proscar. He failed voiding trial yesterday and foley  reinserted. Will need output urology follow up. He can go home tomorrow if BP stabilizes.

## 2017-09-09 NOTE — Care Management Important Message (Signed)
Important Message  Patient Details  Name: Randy [name] MRN: [id number] Date of Birth: 1946-06-15   Medicare Important Message Given:  Yes    Ziyanna Tolin [address] 09/09/2017, 9:42 AM

## 2017-09-09 NOTE — Progress Notes (Signed)
CARDIAC REHAB PHASE I   PRE:  Rate/Rhythm: 77 SR  BP:  Supine: 92/69  Sitting: 80/68  Standing: 83/54   SaO2: 96%RA 8088-1103 Offered to get pt to chair since BP has been low. Stated he did not feel quite up to it right now. BP as documented above. Encouraged to walk with staff later if BP improved. Will continue to follow.        Graylon Good, RN BSN  09/09/2017 9:26 AM

## 2017-09-10 LAB — GLUCOSE, CAPILLARY
GLUCOSE-CAPILLARY: 114 mg/dL — AB (ref 65–99)
Glucose-Capillary: 131 mg/dL — ABNORMAL HIGH (ref 65–99)

## 2017-09-10 MED ORDER — AMIODARONE HCL 200 MG PO TABS: 200.0000 mg | ORAL_TABLET | Freq: Every day | ORAL | 1 refills | Status: DC

## 2017-09-10 MED ORDER — FINASTERIDE 5 MG PO TABS: 5.0000 mg | ORAL_TABLET | Freq: Every day | ORAL | 1 refills | Status: DC

## 2017-09-10 NOTE — Progress Notes (Signed)
CARDIAC REHAB PHASE I   PRE:  Rate/Rhythm: Sinus rhythm 79   BP:    Sitting: 99/54  Standing: 111/62   SaO2: 98% Room Air  MODE:  Ambulation: 250 ft   POST:  Rate/Rhythm: Sinus Rhythm 87  BP:    Sitting: 86/52 Recheck 123/68 then 122/67    SaO2: 97% 1005-1045 Patient ambulated without complaints with assistance times one using a rolling walker. Systolic BP in the 29'H upon return to the recliner. Patient asymptomatic. H20 given. Recheck blood pressure 123/68. Patient's RN in the room at the time and aware. Call light within reach.  Harrell Gave RN BSN

## 2017-09-10 NOTE — Progress Notes (Signed)
Randy [name] and spouse were given discharge instructions.  Discussed new medications and old medications that he will need to stop.  Prescriptions given.  Discussed follow up appointments dates and times, home activities and signs and symptoms for infection.  Answered all questions.  Randy [name] verbalized understanding.  Escorted to discharge by volunteer via wheelchair.

## 2017-09-10 NOTE — Progress Notes (Addendum)
      [address]       [place] [address]             [phone]        8 Days Post-Op Procedure(s) (LRB): CORONARY ARTERY BYPASS GRAFTING (CABG) x three , using left internal mammary artery and right leg greater saphenous vein harvested endoscopically (N/A) TRANSESOPHAGEAL ECHOCARDIOGRAM (TEE) (N/A)  Subjective: Patient without complaints. Looking forward to going home.  Objective: Vital signs in last 24 hours: Temp:  [98.3 F (36.8 C)-98.4 F (36.9 C)] 98.4 F (36.9 C) (02/12 0439) Pulse Rate:  [66-78] 76 (02/12 0439) Cardiac Rhythm: Normal sinus rhythm (02/12 0439) Resp:  [13-20] 17 (02/11 1929) BP: (83-120)/(57-68) 120/62 (02/12 0439) SpO2:  [92 %-100 %] 97 % (02/12 0439) Weight:  [183 lb 1.6 oz (83.1 kg)] 183 lb 1.6 oz (83.1 kg) (02/12 0443)  Pre op weight 82.9  kg Current Weight  09/10/17 183 lb 1.6 oz (83.1 kg)      Intake/Output from previous day: 02/11 0701 - 02/12 0700 In: 480 [P.O.:480] Out: 1300 [Urine:1300]   Physical Exam:  Cardiovascular: RRR Pulmonary: Mostly clear Abdomen: Soft, non tender, bowel sounds present. Extremities: No lower extremity edema.  Wounds: Clean and dry.  No erythema or signs of infection.  Lab Results: CBC: No results for input(s): WBC, HGB, HCT, PLT in the last 72 hours. BMET:  No results for input(s): NA, K, CL, CO2, GLUCOSE, BUN, CREATININE, CALCIUM in the last 72 hours.  PT/INR:  Lab Results  Component Value Date   INR 1.50 09/02/2017   INR 1.13 08/30/2017   INR 1.05 07/13/2013   ABG:  INR: Will add last result for INR, ABG once components are confirmed Will add last 4 CBG results once components are confirmed  Assessment/Plan:  1. CV - Previous a fib. Maintaining SR with HR in the 70's. Had ortho stasis (likely secondary to copious urine output despite not being on Lasix and Coreg) over weekend. Coreg stopped yesterday. On Amiodarone 200 mg daily. 2.  Pulmonary - On room air. Encourage  incentive spirometer. 3. CKD (stage III)-last creatinine increased to 1.79 (baseline creatinine around 1.9) 4.  Acute blood loss anemia - Last H and H stable at 8.1 and 24.8 5. GU-urinary retention. On Proscar. Foley re inserted yesterday. Foley to remain and he will need to follow up with urologist. 6. Thrombocytopenia-last platelets slightly increased to 126,000 7. DM-CBGs 87/127/131. On Glipizide 5 mg daily. Pre op HGA1C 6.4. 9. Chest tube sutures to remain-will remove in office after discharge. 10. Discharge   Donielle M ZimmermanPA-C 09/10/2017,7:13 AM   Chart reviewed, patient examined, agree with above. Still making a lot of urine but BP much better. I think he can go home today with outpatient urology follow up.

## 2017-09-10 NOTE — Care Management Note (Addendum)
Case Management Note Previous CM note completed by Zenon Mayo, RN-09/04/2017, 7:28 PM   Patient Details  Name: Randy [name] MRN: [id number] Date of Birth: Apr 02, 1946  Subjective/Objective:  From home with wife POD 2 CABG with post op afib, conts on amio drip , cont to diuresis,  Ambulated on unit today.  He has PCP listed and [employer].                   Action/Plan: NCM will follow for dc needs.   Expected Discharge Date:  09/10/17               Expected Discharge Plan:  Home/Self Care  In-House Referral:  NA  Discharge planning Services  CM Consult  Post Acute Care Choice:  NA Choice offered to:  NA  DME Arranged:    DME Agency:     HH Arranged:    HH Agency:     Status of Service:  Completed, signed off  If discussed at [employer] of Stay Meetings, dates discussed:    Discharge Disposition: home/self care   Additional Comments:  09/10/17- 1120- Cherish Runde RN, CM- pt for d/c home today- no CM needs noted for transition home. Will d/c home with S.O.   Dahlia Client, Romeo Rabon, RN 09/10/2017, 11:20 AM [phone] 4E Transition Care Coordinator

## 2017-09-10 NOTE — Plan of Care (Signed)
Reviewed and adequate for discharge.

## 2017-09-11 ENCOUNTER — Telehealth: Payer: Self-pay | Admitting: Family Medicine

## 2017-09-11 MED ORDER — GABAPENTIN 300 MG PO CAPS: 600.0000 mg | ORAL_CAPSULE | Freq: Two times a day (BID) | ORAL | 2 refills | Status: DC

## 2017-09-11 NOTE — Telephone Encounter (Signed)
Rx sent to [facility] per pt req/pt aware/thx dmf

## 2017-09-11 NOTE — Telephone Encounter (Signed)
CRM for notification. See Telephone encounter for:     09/11/17.    Relation to pt: self   Call back number: [phone]  Pharmacy:  [facility], [place] [phone] (Phone)  [phone] (Fax)    Reason for call:   Patient unable to reach [facility] order requesting 3 month supply of gabapentin (NEURONTIN) 300 MG capsule, please advise

## 2017-09-12 ENCOUNTER — Telehealth (HOSPITAL_COMMUNITY): Payer: Self-pay

## 2017-09-12 NOTE — Telephone Encounter (Signed)
Called patient in regards to Cardiac Rehab - patient is not interested in the program. Closed referral.

## 2017-09-12 NOTE — Telephone Encounter (Signed)
Patients insurance is active and benefits verified through [facility] - $10.00 co-pay, no deductible, out of pocket amount of $3,400/$0.00 has been met, no co-insurance, and no pre-authorization is required. Passport/reference [id number]  Patient will be contacted and scheduled after their follow up appt with the Cardiologist office upon review by the RN Navigator.

## 2017-09-13 ENCOUNTER — Ambulatory Visit (INDEPENDENT_AMBULATORY_CARE_PROVIDER_SITE_OTHER): Payer: Self-pay

## 2017-09-13 DIAGNOSIS — Z4802 Encounter for removal of sutures: Secondary | ICD-10-CM

## 2017-09-13 NOTE — Progress Notes (Signed)
Removed 3 sutures from chest tube site. No signs of infection. There was a small opening with middle chest tube incision approx. 1 cm circular in size and in depth. No tunneling seen.The tissue is well vascularized and bleeds easily. The area was pack with 1/2 inch plain packing strip Wet-dry and covered with 2x2 and tape. The patient will change dressing daily. He was instructed to call back if signs of infection develop.

## 2017-09-16 ENCOUNTER — Other Ambulatory Visit: Payer: Self-pay

## 2017-09-16 NOTE — Patient Outreach (Signed)
[facility]) Care Management  09/16/2017  Randy [name] [birth year] [id number]       EMMI- CHF RED ON EMMI ALERT Day # 3 Date: 09/15/17 Red Alert Reason: " Weighed themselves today? Yes  New/worsening problems? Yes   New swelling? Yes"   Outreach attempt #  1 to patient. Spoke with patient. Reviewed and addressed red alerts. Patient voices that machine "tricked" him up and did not record his responses correctly. He denies any issues or worsening problems at this time. He voices he has been doing fairly well since return home. He is weighing himself daily. Weight this morning was 179 lbs. He voices that weight fluctuates about a pound or two. RN CM reviewed with patient importance of seeking medical attention for weight gain of 3 or more pounds within 24 hrs or 5 or more pounds within a week. He voiced understanding and is aware of this. He states he is adhering to a low salt diet although his appetite has not been the best since he has been sick. He has all his meds. He denies any questions or concerns regarding them. RN CM reviewed with patient MD follow up appts per discharge summary. Patient is aware and voices he has transportation to appts. He has not made PCP appt as not listed on discharge paperwork. RN CM encouraged patient to contact PCP office to make them aware of recent hospitalization and to see if he needs to be seen in the office. Patient voiced understanding and will call office today. He denies any further RN CM needs or concerns at this time. Advised patient that they would continue to get automated EMMI- HF post discharge calls to assess how they are doing following recent hospitalization and will receive a call from a nurse if any of their responses were abnormal. Patient voiced understanding and was appreciative of f/u call.       Plan: RN CM will notify [facility] administrative assistant of case status.    Enzo Montgomery, RN,BSN,CCM [place]  Management Telephonic Care Management Coordinator Direct Phone: [phone] Toll Free: [phone] Fax: [phone]

## 2017-09-18 ENCOUNTER — Ambulatory Visit (INDEPENDENT_AMBULATORY_CARE_PROVIDER_SITE_OTHER): Payer: Medicare HMO | Admitting: *Deleted

## 2017-09-18 DIAGNOSIS — I255 Ischemic cardiomyopathy: Secondary | ICD-10-CM

## 2017-09-18 NOTE — Progress Notes (Signed)
Remote ICD transmission.   

## 2017-09-19 ENCOUNTER — Encounter: Payer: Self-pay | Admitting: Cardiology

## 2017-09-19 DIAGNOSIS — R338 Other retention of urine: Secondary | ICD-10-CM | POA: Diagnosis not present

## 2017-09-23 ENCOUNTER — Other Ambulatory Visit: Payer: Self-pay

## 2017-09-23 NOTE — Patient Outreach (Signed)
[place] [facility]) Care Management  09/23/2017  Randy [name] 10-01-1945 [id number]     EMMI- HF RED ON EMMI ALERT Day # 8 Date: 09/20/17 Red Alert Reason: "Went to follow up appt?No   Why they didn't attend follow up appt? I Missed it"    Outreach attempt # 1 to patient. Spoke with patient who voices he is doing fine. Reviewed and addressed red alerts with patient. He voices that he did not make PCP appt as it was not listed on his discharge paperwork. RN CM reviewed info discussed with patient on previous call about alerting PCP of recent hospitalization and letting MD determine rather or not an office visit is needed. He remembered this but voices he forgot. He states he will call PCP office this morning to see if he needs to be seen. He voices that weight is stable(179-181 lbs normally). He stets that yesterday he noticed a little swelling in his ankles but he sat in his recliner and elevated them and it went down. He voices no other needs or concerns at this time. Advised patient that they would continue to get automated EMMI- HF post discharge calls to assess how they are doing following recent hospitalization and will receive a call from a nurse if any of their responses were abnormal. Patient voiced understanding and was appreciative of f/u call.       Plan: RN CM will notify [facility] administrative assistant of case status.   Enzo Montgomery, RN,BSN,CCM [place] Management Telephonic Care Management Coordinator Direct Phone: [phone] Toll Free: [phone] Fax: [phone]

## 2017-09-24 ENCOUNTER — Other Ambulatory Visit: Payer: Self-pay

## 2017-09-24 NOTE — Patient Outreach (Signed)
[facility]) Care Management  09/24/2017  Randy [name] 09-09-1945 [id number]      [name] RED ON EMMI ALERT Day # 11 Date: 09/23/17 Red Alert Reason: "New/worsening problems? Yes"    Red on EMMI dashboard received. No outreach call warranted to patient at this time. RN CM addressed issue on previous call.      Plan: RN CM will notify [facility] administrative assistant of case status.   Enzo Montgomery, RN,BSN,CCM [place] Management Telephonic Care Management Coordinator Direct Phone: [phone] Toll Free: [phone] Fax: [phone]

## 2017-09-26 ENCOUNTER — Other Ambulatory Visit: Payer: Self-pay

## 2017-09-26 LAB — CUP PACEART REMOTE DEVICE CHECK
Battery Remaining Percentage: 88 %
Battery Voltage: 3.05 V
Brady Statistic AP VP Percent: 1 %
Brady Statistic RA Percent Paced: 3.6 %
Brady Statistic RV Percent Paced: 1 %
HIGH POWER IMPEDANCE MEASURED VALUE: 60 Ohm
HighPow Impedance: 60 Ohm
Implantable Lead Implant Date: 20091022
Implantable Lead Location: 753859
Implantable Pulse Generator Implant Date: 20180322
Lead Channel Impedance Value: 730 Ohm
Lead Channel Pacing Threshold Amplitude: 0.5 V
Lead Channel Pacing Threshold Amplitude: 1.25 V
Lead Channel Pacing Threshold Pulse Width: 0.6 ms
Lead Channel Sensing Intrinsic Amplitude: 10.3 mV
Lead Channel Setting Pacing Amplitude: 2.25 V
Lead Channel Setting Pacing Amplitude: 2.5 V
Lead Channel Setting Pacing Pulse Width: 0.6 ms
MDC IDC LEAD IMPLANT DT: 20180322
MDC IDC LEAD LOCATION: 753860
MDC IDC MSMT BATTERY REMAINING LONGEVITY: 86 mo
MDC IDC MSMT LEADCHNL RA IMPEDANCE VALUE: 330 Ohm
MDC IDC MSMT LEADCHNL RA PACING THRESHOLD PULSEWIDTH: 0.5 ms
MDC IDC MSMT LEADCHNL RA SENSING INTR AMPL: 1.3 mV
MDC IDC SESS DTM: 20190220072058
MDC IDC SET LEADCHNL RV SENSING SENSITIVITY: 0.5 mV
MDC IDC STAT BRADY AP VS PERCENT: 4.5 %
MDC IDC STAT BRADY AS VP PERCENT: 1 %
MDC IDC STAT BRADY AS VS PERCENT: 95 %
Pulse Gen Serial Number: 7413245

## 2017-09-26 NOTE — Patient Outreach (Signed)
[facility]) Care Management  09/26/2017  Randy [name] 10/28/1945 [id number]      EMMI- CHF RED ON EMMI ALERT Day # 09/25/17 Date: 13 Red Alert Reason: " New/worsening problems? Yes"   Outreach attempt # 1 to patient. Spoke with patient and reviewed and addressed red alert. Patient voices that again the machine did not record accurate response. He stets the question tripped him up. He voices that he is doing well. He continues to have a little swelling to feet but symptoms have not worsening. He is adhering to RN CM instructions of elevating feet. Patient remains consistent with  No added salt diet. He has not been told about any fluid restrictions but is not overly drinking a lot of fluids during the day. He stets he still has some soreness in his chest and will discuss with MD and at appt next week. Otherwise, he is doing fine. Advised patient that they would continue to get automated EMMI- HF post discharge calls to assess how they are doing following recent hospitalization and will receive a call from a nurse if any of their responses were abnormal. Patient voiced understanding and was appreciative of f/u call.       Plan: RN CM will notify [facility] administrative assistant of case status.   Enzo Montgomery, RN,BSN,CCM [place] Management Telephonic Care Management Coordinator Direct Phone: [phone] Toll Free: [phone] Fax: [phone]

## 2017-09-27 ENCOUNTER — Other Ambulatory Visit: Payer: Self-pay

## 2017-09-27 NOTE — Patient Outreach (Signed)
[place] [facility]) Care Management  09/27/2017  BROADY LAFOY September 07, 1945 [id number]      [name] RED ON EMMI ALERT Day # 14 Date: 09/26/17 Red Alert Reason: "Any new problems? Yes   New/worsening problems? Yes  Nausea or vomiting Yes   Tired/fatigued? Yes"    Red on EMMI dashboard received. No outreach call warranted to patient at this time. RN CM addressed issue on previous call.       Plan: RN CM will notify [facility] administrative assistant of case status.   Enzo Montgomery, RN,BSN,CCM [place] Management Telephonic Care Management Coordinator Direct Phone: [phone] Toll Free: [phone] Fax: [phone]

## 2017-09-30 ENCOUNTER — Other Ambulatory Visit: Payer: Self-pay

## 2017-09-30 NOTE — Patient Outreach (Signed)
[facility]) Care Management  09/30/2017  Randy [name] 24-Nov-1945 [id number]     [name] RED ON EMMI ALERT Day # 16 Date: 09/28/17 Red Alert Reason: "New/worsening problems? Yes   New swelling? Yes   New/worsening SOB? Yes"     Outreach attempt # 1 to patient. No answer at present. RN CM left HIPAA compliant voicemail message along with contact info.        Plan: RN CM will make outreach attempt to patient within one business day if no return call from patient.   Enzo Montgomery, RN,BSN,CCM [place] Management Telephonic Care Management Coordinator Direct Phone: [phone] Toll Free: [phone] Fax: [phone]

## 2017-10-01 ENCOUNTER — Other Ambulatory Visit: Payer: Self-pay

## 2017-10-01 ENCOUNTER — Encounter: Payer: Medicare HMO | Admitting: Family Medicine

## 2017-10-01 MED ORDER — ATORVASTATIN CALCIUM 80 MG PO TABS: 80.0000 mg | ORAL_TABLET | Freq: Every day | ORAL | 2 refills | Status: DC

## 2017-10-01 MED ORDER — FINASTERIDE 5 MG PO TABS: 5.0000 mg | ORAL_TABLET | Freq: Every day | ORAL | 2 refills | Status: DC

## 2017-10-01 NOTE — Progress Notes (Signed)
Pt came for OV today/per TA not needed/faxed in Rx's to [facility]/thx dmf

## 2017-10-01 NOTE — Patient Outreach (Signed)
[facility]) Care Management  10/01/2017  CAROLD EISNER [birth year] [id number]   [name] RED ON EMMI ALERT Day # 16 Date: 09/28/17 Red Alert Reason: "New/worsening problems? Yes   New swelling? Yes   New/worsening SOB? Yes"  [name] RED ON EMMI ALERT Day # 18 Date: 09/30/17 Red Alert Reason: "New/worsening problems? Yes   New/worsening SOB? Yes"     Outreach attempt # 1 to patient.  Spoke with patient. Reviewed and addressed red alerts. Patient reports he continues to have mild SOB with exertion but symptoms have not gotten any worse. He has history of chronic dyspnea with exertion. He denies any SOB with rest. He states it normally happens after he has done extensive walking or activity. He is aware of need for freq rest periods and voices SOB resolves as soon as he rests. He has chronic mild edema to one leg. He is taking RN advise of elevating extremity as much as possible which he voices is helping. Weight remains stable at 184 lbs. He denies any other symptoms or issues at this time. He goes this afternoon for MD follow up appt. Patient instructed to make sur he discusses his symptoms with MD. He voiced understanding. No further RN CM needs or concerns. Patient is aware that he will continue to get automated post discharge [name] calls and will receive a call from a nurse if any of his responses are abnormal.     Plan: RN CM will notify [facility] administrative assistant of case status.  Enzo Montgomery, RN,BSN,CCM [place] Management Telephonic Care Management Coordinator Direct Phone: [phone] Toll Free: [phone] Fax: [phone]

## 2017-10-01 NOTE — Progress Notes (Signed)
Subjective:   Patient ID: Randy [name], male    DOB: January 06, 1946, 72 y.o.   MRN: [id number]  Randy [name] is a pleasant 72 y.o. year old male who presents to clinic today with No chief complaint on file.  on 10/01/2017  HPI:  Hospital follow up- admitted having an NSTEMI.     Current Outpatient Medications on File Prior to Visit  Medication Sig Dispense Refill  . ACCU-CHEK SOFTCLIX LANCETS lancets Use as instructed to check blood sugar once daily and as needed.  Diagnosis:  E11.21  Non- insulin dependent. 100 each 3  . acetaminophen (TYLENOL) 325 MG tablet Take 2 tablets (650 mg total) by mouth every 6 (six) hours as needed for mild pain.    Marland Kitchen albuterol (PROVENTIL HFA;VENTOLIN HFA) 108 (90 Base) MCG/ACT inhaler Inhale 2 puffs into the lungs every 6 (six) hours as needed for wheezing or shortness of breath. 1 Inhaler 0  . Alcohol Swabs (B-D SINGLE USE SWABS REGULAR) PADS Use as instructed to clean area for glucose monitoring once daily and as needed.  Diagnosis:  E11.21  Non insulin-dependent 100 each 3  . amiodarone (PACERONE) 200 MG tablet Take 1 tablet (200 mg total) by mouth daily. 30 tablet 1  . aspirin EC 325 MG tablet Take 1 tablet (325 mg total) by mouth daily.    Marland Kitchen atorvastatin (LIPITOR) 80 MG tablet Take 1 tablet (80 mg total) by mouth daily at 6 PM. 30 tablet 1  . Blood Glucose Calibration (ACCU-CHEK AVIVA) SOLN Use to calibrate blood glucose machine as recommended.  Diagnosis:  E11.21  Non-insulin dependent. 1 each 2  . finasteride (PROSCAR) 5 MG tablet Take 1 tablet (5 mg total) by mouth daily. 30 tablet 1  . gabapentin (NEURONTIN) 300 MG capsule Take 2 capsules (600 mg total) by mouth 2 (two) times daily. 360 capsule 2  . glipiZIDE (GLUCOTROL) 5 MG tablet TAKE 1 TABLET EVERY DAY BEFORE BREAKFAST 90 tablet 1  . glucose blood (ACCU-CHEK AVIVA PLUS) test strip Test Blood Sugar 1 time daily: Dx:E11.27 100 each 3  . traMADol (ULTRAM) 50 MG tablet Take 1 tablet (50 mg total) by  mouth every 6 (six) hours as needed for moderate pain. 30 tablet 0   No current facility-administered medications on file prior to visit.     No Known Allergies  Past Medical History:  Diagnosis Date  . AICD (automatic cardioverter/defibrillator) present 10/18/2016   "replaced 2009-[employer] device, Dr Lovena Le"  . Anemia   . Arthritis    "back" (08/27/2017)  . CAD (coronary artery disease)   . CHF (congestive heart failure) ([place])   . CKD (chronic kidney disease) stage 3, GFR 30-59 ml/min (HCC)    [name]  . Diverticulosis   . Dyslipidemia   . GERD (gastroesophageal reflux disease)   . Hypertension   . Neuromuscular disorder (HCC)    neuropathy- in hands  . Osteoarthritis    hips, hands  . Pacemaker   . Shortness of breath   . Sleep apnea    started a sleep study but didn't completed, states PCP told him he has sleep  apnea, doesn't use CPAP  . Systolic dysfunction   . Type 2 diabetes with nephropathy ([place])   . Vitamin D deficiency     Past Surgical History:  Procedure Laterality Date  . ANTERIOR CERVICAL DECOMP/DISCECTOMY FUSION    . BACK SURGERY    . CARPAL TUNNEL RELEASE Left   . CORONARY ARTERY BYPASS GRAFT N/A  09/02/2017   Procedure: CORONARY ARTERY BYPASS GRAFTING (CABG) x three , using left internal mammary artery and right leg greater saphenous vein harvested endoscopically;  Surgeon: [name], MD;  Location: [facility];  Service: Open Heart Surgery;  Laterality: N/A;  . CYST EXCISION     "back"  . ICD IMPLANT N/A 10/18/2016   Procedure: ICD Implant- Upgrade to Dual Chamber;  Surgeon: Evans Lance, MD;  Location: [facility] [place];  Service: Cardiovascular;  Laterality: N/A;  . INSERT / REPLACE / REMOVE PACEMAKER    . JOINT REPLACEMENT    . LEFT HEART CATH AND CORONARY ANGIOGRAPHY N/A 08/30/2017   Procedure: LEFT HEART CATH AND CORONARY ANGIOGRAPHY;  Surgeon: Martinique, Peter M, MD;  Location: [facility];  Service: Cardiovascular;  Laterality: N/A;  . LUMBAR  LAMINECTOMY Left 07/17/2013   Procedure: MICRODISCECTOMY LUMBAR LAMINECTOMY;  Surgeon: Marybelle Killings, MD;  Location: [place];  Service: Orthopedics;  Laterality: Left;  Left L4 Laminotomy, Lateral Recess Decompression, Cyst Excision  . SHOULDER OPEN ROTATOR CUFF REPAIR Left   . TEE WITHOUT CARDIOVERSION N/A 09/02/2017   Procedure: TRANSESOPHAGEAL ECHOCARDIOGRAM (TEE);  Surgeon: [name], MD;  Location: [place];  Service: Open Heart Surgery;  Laterality: N/A;  . TOTAL HIP ARTHROPLASTY Left 09/17/2012   Procedure: Left TOTAL HIP ARTHROPLASTY ANTERIOR APPROACH;  Surgeon: Marybelle Killings, MD;  Location: [place];  Service: Orthopedics;  Laterality: Left;    Family History  Problem Relation Age of Onset  . Heart attack Father 23  . Diabetes Brother     Social History   Socioeconomic History  . Marital status: Divorced    Spouse name: Not on file  . Number of children: 2  . Years of education: Not on file  . Highest education level: Not on file  Social Needs  . Financial resource strain: Not on file  . Food insecurity - worry: Not on file  . Food insecurity - inability: Not on file  . Transportation needs - medical: Not on file  . Transportation needs - non-medical: Not on file  Occupational History  . 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 Smoking status: Former Smoker    Packs/day: 2.00    Years: 17.00    Pack years: 34.00    Types: Cigarettes    Last attempt to quit: 07/30/1984    Years since quitting: 33.1  . Smokeless tobacco: Never Used  Substance and Sexual Activity  . Alcohol use: Yes    Comment: 08/27/2017 "probably have had 6 beers in the last 6 months; drink when I play golf"  . Drug use: No  . Sexual activity: No  Other Topics Concern  . Not on file  Social History Narrative   Does not have a living will.   Desires CPR, does want life support if not futile.      The PMH, PSH, Social History, Family History, Medications,  and allergies have been reviewed in [facility], and have been updated if relevant.   Review of Systems     Objective:    There were no vitals taken for this visit.   Physical Exam        Assessment & Plan:   No diagnosis found. No Follow-up on file.

## 2017-10-03 ENCOUNTER — Other Ambulatory Visit: Payer: Self-pay

## 2017-10-03 NOTE — Patient Outreach (Signed)
[place] [facility]) Care Management  10/03/2017  Randy [name] 05-20-1946 [id number]     [name] RED ON EMMI ALERT Day # 20 Date: 10/02/17 Red Alert Reason: "Lost interest in things they used to enjoy? Yes"   Outreach attempt #1 to patient. Spoke with patient who is doing well. Reviewed and addressed red alert. Patient states that again machine recorded his response incorrectly. He denies any lost of interest or pleasure. He voices no sadness or depression. He is his normal happy and bubbly self per patient report. No further issues or concerns at this time that require RN CM assistance. Patient appreciative of check in call to check on him.       Plan: RN CM will notify [facility] administrative assistant of case status.    Enzo Montgomery, RN,BSN,CCM [place] Management Telephonic Care Management Coordinator Direct Phone: [phone] Toll Free: [phone] Fax: [phone]

## 2017-10-07 ENCOUNTER — Other Ambulatory Visit: Payer: Self-pay

## 2017-10-07 ENCOUNTER — Other Ambulatory Visit: Payer: Self-pay | Admitting: Internal Medicine

## 2017-10-07 NOTE — Patient Outreach (Signed)
[facility]) Care Management  10/07/2017  Randy [name] February 17, 1946 [id number]     [name] RED ON EMMI ALERT Day # 22 Date: 10/04/17 Red Alert Reason: "Weight? 185 lbs, Know why to take meds? No"   Outreach attempt # 1 to patient.  Spoke with patient. Reviewed and addressed red alerts. Patient voices that his weight did go up to 185 lbs but attributes it to eating good. However, he continues to voice that he is watching his salt intake. His weight this morning was 184 lbs. He has occasional ankle edema but denies any worsening s/s of edema. Patient manages his meds but does not know off the top of his head without looking at info what med is for. He denies any issues managing his meds and is adhering to med regimen. Patient pleased to report that yesterday was the day he has felt "the best" since returning home. He attributes it to his neuropathy is better managed/controlled. Patient has no further RN CM needs or concerns. He is aware he will continue to receive automated [name] discharge calls. He voiced understanding and was appreciative of call.        Plan: RN CM will notify [facility] administrative assistant of case status.   Enzo Montgomery, RN,BSN,CCM [place] Management Telephonic Care Management Coordinator Direct Phone: [phone] Toll Free: [phone] Fax: [phone]

## 2017-10-09 ENCOUNTER — Ambulatory Visit: Payer: Medicare HMO | Admitting: Surgery

## 2017-10-09 ENCOUNTER — Other Ambulatory Visit: Payer: Self-pay

## 2017-10-09 NOTE — Patient Outreach (Signed)
[place] [facility]) Care Management  10/09/2017  Randy [name] 06-Mar-1946 [id number]     [name] RED ON EMMI ALERT Day # 26 Date: 3/12//19 Red Alert Reason: "New/worsening problems? Yes   New swelling? Yes"   Outreach attempt # 1 to patient. Spoke with patient. Reviewed and addressed red alert. Patient reports machine recorded incorrect response. He denies any new/worsening problems or any swelling. He voices he is doing just fine. He denies any RN CM needs or concerns at this time.         Plan: RN CM will notify [facility] administrative assistant of case status.   Enzo Montgomery, RN,BSN,CCM [place] Management Telephonic Care Management Coordinator Direct Phone: [phone] Toll Free: [phone] Fax: [phone]

## 2017-10-14 ENCOUNTER — Other Ambulatory Visit: Payer: Self-pay

## 2017-10-14 NOTE — Patient Outreach (Signed)
[name]) Care Management  10/14/2017  Randy [name] [birth year] [id number]     EMMI- CHF RED ON EMMI ALERT Day # 29 Date: 10/11/17 Red Alert Reason: "New/worsening problems? Yes   New swelling? Yes   New/worsening SOB? Yes   Know why to take meds? No"     Outreach attempt # 1 to patient. No answer at present then patient immediately called RN CM back. Reviewed and addressed red alerts with patient. He continues to denies any of these symptoms worsening. He has chronic SOB with exertion but adamant that it has not gotten any worse. He has occasional edema that I relieved with elevation. His weight remains stable. Patient managing his meds but does not know off the top of his head what each med is for as he has to read and look at bottles. Otherwise he voices no issues with his meds. He has MD appt next week. He denies any further RN CM needs or concerns at this time. Patient aware that he will continue to get automated daily HF calls.         Plan: RN CM will notify [facility] administrative assistant of case status.    Enzo Montgomery, RN,BSN,CCM [place] Management Telephonic Care Management Coordinator Direct Phone: [phone] Toll Free: [phone] Fax: [phone]

## 2017-10-15 ENCOUNTER — Ambulatory Visit: Payer: Medicare HMO | Admitting: Internal Medicine

## 2017-10-18 ENCOUNTER — Other Ambulatory Visit: Payer: Self-pay | Admitting: Surgery

## 2017-10-18 DIAGNOSIS — Z951 Presence of aortocoronary bypass graft: Secondary | ICD-10-CM

## 2017-10-21 ENCOUNTER — Ambulatory Visit
Admission: RE | Admit: 2017-10-21 | Discharge: 2017-10-21 | Disposition: A | Discharge status: Home / Self Care | Payer: Medicare HMO | Source: Ambulatory Visit | Attending: Surgery | Admitting: Surgery

## 2017-10-21 ENCOUNTER — Other Ambulatory Visit: Payer: Self-pay

## 2017-10-21 ENCOUNTER — Ambulatory Visit (INDEPENDENT_AMBULATORY_CARE_PROVIDER_SITE_OTHER): Payer: Self-pay | Admitting: Surgical

## 2017-10-21 VITALS — BP 128/66 | HR 73 | Resp 16 | Ht 67.0 in | Wt 191.7 lb

## 2017-10-21 DIAGNOSIS — Z951 Presence of aortocoronary bypass graft: Secondary | ICD-10-CM

## 2017-10-21 DIAGNOSIS — I25810 Atherosclerosis of coronary artery bypass graft(s) without angina pectoris: Secondary | ICD-10-CM | POA: Diagnosis not present

## 2017-10-21 NOTE — Progress Notes (Signed)
[address]       [place] [address]             [phone]      Randy [name] [facility] [place] Medical Record #[id number] Date of Birth: [birth year]  Referring: Martinique, Peter M, MD Primary Care: Lucille Passy, MD Primary Cardiologist: No primary care provider on file.   Chief Complaint:   POST OP FOLLOW UP                                                                                CARDIOVASCULAR SURGERY OPERATIVE NOTE  09/02/2017  Surgeon:  [name], MD  First Assistant: Ellwood Handler,  PA-C   Preoperative Diagnosis:  Severe multi-vessel coronary artery disease   Postoperative Diagnosis:  Same   Procedure:  1. Median Sternotomy 2. Extracorporeal circulation 3.   Coronary artery bypass grafting x 3   Left internal mammary graft to the LAD  SVG to Ramus  SVG to PDA  4.   Endoscopic vein harvest from the right leg   Anesthesia:  General Endotracheal    History of Present Illness:    The patient is a 72 year old male status post the above described procedure seen in the office on today's date in routine post surgical follow-up.  States that he does have some occasional shortness of breath as well as occasional dizziness but says this is essentially unchanged from preoperatively.  He has only some mild sternal discomfort and is no longer taking pain medications.  He denies fevers, chills or other constitutional symptoms.  He denies pedal edema or PND.  He has been checking his blood sugars and they typically range in the 110-130 range.  He has yet to follow-up with cardiology as he accidentally canceled his appointment but is rescheduling.  He has had no difficulty with his incisions.  He is increasing his activity level slowly and feels as though he is making steady progress in this regard.      Past Medical History:  Diagnosis Date  . AICD (automatic cardioverter/defibrillator) present 10/18/2016   "replaced 2009-[employer]  Jude device, Dr Lovena Le"  . Anemia   . Arthritis    "back" (08/27/2017)  . CAD (coronary artery disease)   . CHF (congestive heart failure) ([place])   . CKD (chronic kidney disease) stage 3, GFR 30-59 ml/min (HCC)    [name]  . Diverticulosis   . Dyslipidemia   . GERD (gastroesophageal reflux disease)   . Hypertension   . Neuromuscular disorder (HCC)    neuropathy- in hands  . Osteoarthritis    hips, hands  . Pacemaker   . Shortness of breath   . Sleep apnea    started a sleep study but didn't completed, states PCP told him he has sleep  apnea, doesn't use CPAP  . Systolic dysfunction   . Type 2 diabetes with nephropathy ([place])   . Vitamin D deficiency      Social History   Tobacco Use  Smoking Status Former Smoker  . Packs/day: 2.00  . Years: 17.00  . Pack years: 34.00  . Types: Cigarettes  . Last attempt to quit: 07/30/1984  .  Years since quitting: 33.2  Smokeless Tobacco Never Used    Social History   Substance and Sexual Activity  Alcohol Use Yes   Comment: 08/27/2017 "probably have had 6 beers in the last 6 months; drink when I play golf"     No Known Allergies  Current Outpatient Medications  Medication Sig Dispense Refill  . ACCU-CHEK SOFTCLIX LANCETS lancets Use as instructed to check blood sugar once daily and as needed.  Diagnosis:  E11.21  Non- insulin dependent. 100 each 3  . acetaminophen (TYLENOL) 325 MG tablet Take 2 tablets (650 mg total) by mouth every 6 (six) hours as needed for mild pain.    Marland Kitchen albuterol (PROVENTIL HFA;VENTOLIN HFA) 108 (90 Base) MCG/ACT inhaler Inhale 2 puffs into the lungs every 6 (six) hours as needed for wheezing or shortness of breath. 1 Inhaler 0  . Alcohol Swabs (B-D SINGLE USE SWABS REGULAR) PADS Use as instructed to clean area for glucose monitoring once daily and as needed.  Diagnosis:  E11.21  Non insulin-dependent 100 each 3  . amiodarone (PACERONE) 200 MG tablet Take 1 tablet (200 mg total) by mouth daily. 30 tablet 1  .  aspirin EC 325 MG tablet Take 1 tablet (325 mg total) by mouth daily.    Marland Kitchen atorvastatin (LIPITOR) 80 MG tablet Take 1 tablet (80 mg total) by mouth daily at 6 PM. 90 tablet 2  . Blood Glucose Calibration (ACCU-CHEK AVIVA) SOLN Use to calibrate blood glucose machine as recommended.  Diagnosis:  E11.21  Non-insulin dependent. 1 each 2  . finasteride (PROSCAR) 5 MG tablet Take 1 tablet (5 mg total) by mouth daily. 90 tablet 2  . gabapentin (NEURONTIN) 300 MG capsule Take 2 capsules (600 mg total) by mouth 2 (two) times daily. 360 capsule 2  . glipiZIDE (GLUCOTROL) 5 MG tablet TAKE 1 TABLET EVERY DAY BEFORE BREAKFAST 90 tablet 1  . glucose blood (ACCU-CHEK AVIVA PLUS) test strip Test Blood Sugar 1 time daily: Dx:E11.27 100 each 3  . traMADol (ULTRAM) 50 MG tablet Take 1 tablet (50 mg total) by mouth every 6 (six) hours as needed for moderate pain. 30 tablet 0   No current facility-administered medications for this visit.        Physical Exam: BP 128/66 (BP Location: Right Arm, Patient Position: Sitting, Cuff Size: Large)   Pulse 73   Resp 16   Ht 5\' 7"  (1.702 m)   Wt 191 lb 11.2 oz (87 kg)   SpO2 95% Comment: ON RA  BMI 30.02 kg/m   General appearance: alert, cooperative and no distress Heart: regular rate and rhythm Lungs: clear to auscultation bilaterally Abdomen: soft, non-tender; bowel sounds normal; no masses,  no organomegaly Extremities: Trace ankle edema Wound: Incisions healing well without evidence of infection.   Diagnostic Studies & Laboratory data:     Recent Radiology Findings:   Dg Chest 2 View  Result Date: 10/21/2017 CLINICAL DATA:  CABG EXAM: CHEST - 2 VIEW COMPARISON:  09/07/2017 FINDINGS: Prior CABG. Left AICD is unchanged. Heart is normal size. No confluent airspace opacities or effusions. No acute bony abnormality. IMPRESSION: No active cardiopulmonary disease. Electronically Signed   By: Rolm Baptise M.D.   On: 10/21/2017 15:29      Recent Lab  Findings: Lab Results  Component Value Date   WBC 8.5 09/06/2017   HGB 8.1 (L) 09/06/2017   HCT 24.8 (L) 09/06/2017   PLT 126 (L) 09/06/2017   GLUCOSE 121 (H) 09/07/2017   CHOL  198 04/22/2017   TRIG (H) 04/22/2017    426.0 Triglyceride is over 400; calculations on Lipids are invalid.   HDL 28.80 (L) 04/22/2017   LDLDIRECT 97.0 04/22/2017   LDLCALC 115 (H) 03/09/2016   ALT 15 (L) 08/28/2017   AST 39 08/28/2017   NA 134 (L) 09/07/2017   K 3.9 09/07/2017   CL 100 (L) 09/07/2017   CREATININE 1.79 (H) 09/07/2017   BUN 38 (H) 09/07/2017   CO2 24 09/07/2017   TSH 2.113 08/27/2017   INR 1.50 09/02/2017   HGBA1C 6.4 (H) 09/02/2017      Assessment / Plan: The patient is making good overall progress.  We had a long discussion regarding lifestyle and nutrition and he appears to understand the importance as it relates to long-term disease progression.  He is scheduling an appointment with cardiology for follow-up.  I also recommended continued follow-up with his primary care physician regarding his diabetic medications.  As there are no current surgically related issues we will see the patient again on a as needed basis or at request          John Giovanni, PA-C 10/21/2017 3:41 PM

## 2017-10-21 NOTE — Patient Instructions (Signed)
Discussed activity progression including driving. °

## 2017-10-22 ENCOUNTER — Other Ambulatory Visit: Payer: Self-pay

## 2017-10-22 MED ORDER — AMIODARONE HCL 200 MG PO TABS: 200.0000 mg | ORAL_TABLET | Freq: Every day | ORAL | 1 refills | Status: DC

## 2017-10-24 ENCOUNTER — Other Ambulatory Visit: Payer: Self-pay

## 2017-10-24 NOTE — Patient Outreach (Signed)
[facility]) Care Management  10/24/2017  MAJOR SANTERRE 10-20-1945 [id number]     [name] RED ON EMMI ALERT Day # 41 Date: 10/23/17 Red Alert Reason: "New/worsening problems? Yes"   Outreach attempt # 1 to patient. No answer at present.        Plan: RN CM will make outreach attempt to patient within 3-4 business days.    Enzo Montgomery, RN,BSN,CCM [place] Management Telephonic Care Management Coordinator Direct Phone: [phone] Toll Free: [phone] Fax: [phone]

## 2017-10-25 ENCOUNTER — Other Ambulatory Visit: Payer: Self-pay

## 2017-10-25 NOTE — Patient Outreach (Signed)
[facility]) Care Management  10/25/2017  Randy [name] 04/14/1946 [id number]    [name] RED ON EMMI ALERT Day # 41 Date: 10/23/17 Red Alert Reason: "New/worsening problems? Yes"    Outreach attempt #2 to patient and no answer. Voicemail message left.      Plan: RN CM will make outreach attempt to patient within 3-4 business days.    Enzo Montgomery, RN,BSN,CCM [place] Management Telephonic Care Management Coordinator Direct Phone: [phone] Toll Free: [phone] Fax: [phone]

## 2017-10-28 ENCOUNTER — Other Ambulatory Visit: Payer: Self-pay

## 2017-10-28 NOTE — Patient Outreach (Signed)
[facility]) Care Management  10/28/2017  Randy [name] 1946/06/23 [id number]   [name] RED ON EMMI ALERT Day #41 Date:10/23/17 Red Alert Reason:"New/worsening problems? Yes"    Outreach attempt #3 to patient. No answer at present. Multiple attempts to establish contact with patient without response. Patient has completed automated post discharge [name] calls.     Plan: RN CM will close case at this time.   Enzo Montgomery, RN,BSN,CCM [place] Management Telephonic Care Management Coordinator Direct Phone: [phone] Toll Free: [phone] Fax: [phone]

## 2017-10-29 ENCOUNTER — Ambulatory Visit: Payer: Self-pay

## 2017-10-29 ENCOUNTER — Encounter: Payer: Self-pay | Admitting: Internal Medicine

## 2017-10-29 ENCOUNTER — Ambulatory Visit: Payer: Medicare HMO | Admitting: Internal Medicine

## 2017-10-29 VITALS — BP 140/76 | HR 69 | Ht 67.0 in | Wt 192.4 lb

## 2017-10-29 DIAGNOSIS — I5022 Chronic systolic (congestive) heart failure: Secondary | ICD-10-CM

## 2017-10-29 DIAGNOSIS — I10 Essential (primary) hypertension: Secondary | ICD-10-CM | POA: Diagnosis not present

## 2017-10-29 DIAGNOSIS — I110 Hypertensive heart disease with heart failure: Secondary | ICD-10-CM | POA: Diagnosis not present

## 2017-10-29 DIAGNOSIS — Z95810 Presence of automatic (implantable) cardiac defibrillator: Secondary | ICD-10-CM | POA: Diagnosis not present

## 2017-10-29 DIAGNOSIS — I4891 Unspecified atrial fibrillation: Secondary | ICD-10-CM | POA: Diagnosis not present

## 2017-10-29 LAB — CUP PACEART INCLINIC DEVICE CHECK
Brady Statistic RA Percent Paced: 2.4 %
Brady Statistic RV Percent Paced: 0.49 %
Date Time Interrogation Session: 20190402165015
HIGH POWER IMPEDANCE MEASURED VALUE: 66.375
Implantable Lead Implant Date: 20091022
Implantable Lead Implant Date: 20180322
Implantable Lead Location: 753859
Implantable Lead Model: 7121
Lead Channel Impedance Value: 312.5 Ohm
Lead Channel Pacing Threshold Amplitude: 0.5 V
Lead Channel Pacing Threshold Amplitude: 1.25 V
Lead Channel Pacing Threshold Amplitude: 1.25 V
Lead Channel Sensing Intrinsic Amplitude: 12 mV
Lead Channel Setting Pacing Amplitude: 2.25 V
Lead Channel Setting Pacing Pulse Width: 0.6 ms
MDC IDC LEAD LOCATION: 753860
MDC IDC MSMT BATTERY REMAINING LONGEVITY: 92 mo
MDC IDC MSMT LEADCHNL RA PACING THRESHOLD AMPLITUDE: 0.5 V
MDC IDC MSMT LEADCHNL RA PACING THRESHOLD PULSEWIDTH: 0.5 ms
MDC IDC MSMT LEADCHNL RA PACING THRESHOLD PULSEWIDTH: 0.5 ms
MDC IDC MSMT LEADCHNL RA SENSING INTR AMPL: 0.9 mV
MDC IDC MSMT LEADCHNL RV IMPEDANCE VALUE: 787.5 Ohm
MDC IDC MSMT LEADCHNL RV PACING THRESHOLD PULSEWIDTH: 0.6 ms
MDC IDC MSMT LEADCHNL RV PACING THRESHOLD PULSEWIDTH: 0.6 ms
MDC IDC PG IMPLANT DT: 20180322
MDC IDC SET LEADCHNL RV PACING AMPLITUDE: 2.5 V
MDC IDC SET LEADCHNL RV SENSING SENSITIVITY: 0.5 mV
Pulse Gen Serial Number: 7413245

## 2017-10-29 MED ORDER — CARVEDILOL 3.125 MG PO TABS: 3.1250 mg | ORAL_TABLET | Freq: Two times a day (BID) | ORAL | 3 refills | Status: DC

## 2017-10-29 MED ORDER — FUROSEMIDE 20 MG PO TABS: 20.0000 mg | ORAL_TABLET | Freq: Every day | ORAL | 3 refills | Status: DC

## 2017-10-29 MED ORDER — CARVEDILOL 3.125 MG PO TABS: 3.1250 mg | ORAL_TABLET | Freq: Two times a day (BID) | ORAL | 11 refills | Status: DC

## 2017-10-29 MED ORDER — FUROSEMIDE 20 MG PO TABS: 20.0000 mg | ORAL_TABLET | Freq: Every day | ORAL | 11 refills | Status: DC

## 2017-10-29 NOTE — Progress Notes (Addendum)
HPI [name] returns today for ongoing evaluation and management of his ICM, chronic systolic heart failure, s/p remote ICD insertion. He underwent CABG 8 weeks ago. In the interim, he has not had chest pain but does not sob. He is currently not on a diuretic. He has a h/o VT and PAF and has been on amiodarone although he is not systemically anti-coagulated. He has moderate LV dysfunction by echo and cath. He has not had trouble healing after CABG. He denies dietary indiscretion. No Known Allergies   Current Outpatient Medications  Medication Sig Dispense Refill  . ACCU-CHEK SOFTCLIX LANCETS lancets Use as instructed to check blood sugar once daily and as needed.  Diagnosis:  E11.21  Non- insulin dependent. 100 each 3  . acetaminophen (TYLENOL) 325 MG tablet Take 2 tablets (650 mg total) by mouth every 6 (six) hours as needed for mild pain.    Marland Kitchen albuterol (PROVENTIL HFA;VENTOLIN HFA) 108 (90 Base) MCG/ACT inhaler Inhale 2 puffs into the lungs every 6 (six) hours as needed for wheezing or shortness of breath. 1 Inhaler 0  . amiodarone (PACERONE) 200 MG tablet Take 1 tablet (200 mg total) by mouth daily. 30 tablet 1  . aspirin EC 325 MG tablet Take 1 tablet (325 mg total) by mouth daily.    Marland Kitchen atorvastatin (LIPITOR) 80 MG tablet Take 1 tablet (80 mg total) by mouth daily at 6 PM. 90 tablet 2  . Blood Glucose Calibration (ACCU-CHEK AVIVA) SOLN Use to calibrate blood glucose machine as recommended.  Diagnosis:  E11.21  Non-insulin dependent. 1 each 2  . finasteride (PROSCAR) 5 MG tablet Take 1 tablet (5 mg total) by mouth daily. 90 tablet 2  . glucose blood (ACCU-CHEK AVIVA PLUS) test strip Test Blood Sugar 1 time daily: Dx:E11.27 100 each 3  . Alcohol Swabs (B-D SINGLE USE SWABS REGULAR) PADS Use as instructed to clean area for glucose monitoring once daily and as needed.  Diagnosis:  E11.21  Non insulin-dependent (Patient not taking: Reported on 10/29/2017) 100 each 3  . carvedilol (COREG)  3.125 MG tablet Take 1 tablet (3.125 mg total) by mouth 2 (two) times daily. 60 tablet 11  . furosemide (LASIX) 20 MG tablet Take 1 tablet (20 mg total) by mouth daily. 30 tablet 11  . gabapentin (NEURONTIN) 300 MG capsule Take 2 capsules (600 mg total) by mouth 2 (two) times daily. (Patient not taking: Reported on 10/29/2017) 360 capsule 2  . glipiZIDE (GLUCOTROL) 5 MG tablet TAKE 1 TABLET EVERY DAY BEFORE BREAKFAST (Patient not taking: Reported on 10/29/2017) 90 tablet 1  . traMADol (ULTRAM) 50 MG tablet Take 1 tablet (50 mg total) by mouth every 6 (six) hours as needed for moderate pain. (Patient not taking: Reported on 10/29/2017) 30 tablet 0   No current facility-administered medications for this visit.      Past Medical History:  Diagnosis Date  . AICD (automatic cardioverter/defibrillator) present 10/18/2016   "replaced 2009-[employer]. Jude device, Dr Lovena Le"  . Anemia   . Arthritis    "back" (08/27/2017)  . CAD (coronary artery disease)   . CHF (congestive heart failure) ([place])   . CKD (chronic kidney disease) stage 3, GFR 30-59 ml/min (HCC)    [name]  . Diverticulosis   . Dyslipidemia   . GERD (gastroesophageal reflux disease)   . Hypertension   . Neuromuscular disorder (HCC)    neuropathy- in hands  . Osteoarthritis    hips, hands  . Pacemaker   .  Shortness of breath   . Sleep apnea    started a sleep study but didn't completed, states PCP told him he has sleep  apnea, doesn't use CPAP  . Systolic dysfunction   . Type 2 diabetes with nephropathy ([place])   . Vitamin D deficiency     ROS:   All systems reviewed and negative except as noted in the HPI.   Past Surgical History:  Procedure Laterality Date  . ANTERIOR CERVICAL DECOMP/DISCECTOMY FUSION    . BACK SURGERY    . CARPAL TUNNEL RELEASE Left   . CORONARY ARTERY BYPASS GRAFT N/A 09/02/2017   Procedure: CORONARY ARTERY BYPASS GRAFTING (CABG) x three , using left internal mammary artery and right leg greater saphenous vein  harvested endoscopically;  Surgeon: [name], MD;  Location: [place] OR;  Service: Open Heart Surgery;  Laterality: N/A;  . CYST EXCISION     "back"  . ICD IMPLANT N/A 10/18/2016   Procedure: ICD Implant- Upgrade to Dual Chamber;  Surgeon: Evans Lance, MD;  Location: [place] CV LAB;  Service: Cardiovascular;  Laterality: N/A;  . INSERT / REPLACE / REMOVE PACEMAKER    . JOINT REPLACEMENT    . LEFT HEART CATH AND CORONARY ANGIOGRAPHY N/A 08/30/2017   Procedure: LEFT HEART CATH AND CORONARY ANGIOGRAPHY;  Surgeon: Martinique, Peter M, MD;  Location: [place] CV LAB;  Service: Cardiovascular;  Laterality: N/A;  . LUMBAR LAMINECTOMY Left 07/17/2013   Procedure: MICRODISCECTOMY LUMBAR LAMINECTOMY;  Surgeon: Marybelle Killings, MD;  Location: [place];  Service: Orthopedics;  Laterality: Left;  Left L4 Laminotomy, Lateral Recess Decompression, Cyst Excision  . SHOULDER OPEN ROTATOR CUFF REPAIR Left   . TEE WITHOUT CARDIOVERSION N/A 09/02/2017   Procedure: TRANSESOPHAGEAL ECHOCARDIOGRAM (TEE);  Surgeon: [name], MD;  Location: [place];  Service: Open Heart Surgery;  Laterality: N/A;  . TOTAL HIP ARTHROPLASTY Left 09/17/2012   Procedure: Left TOTAL HIP ARTHROPLASTY ANTERIOR APPROACH;  Surgeon: Marybelle Killings, MD;  Location: [place];  Service: Orthopedics;  Laterality: Left;     Family History  Problem Relation Age of Onset  . Heart attack Father 4  . Diabetes Brother      Social History   Socioeconomic History  . Marital status: Divorced    Spouse name: Not on file  . Number of children: 2  . Years of education: Not on file  . Highest education level: Not on file  Occupational History  . 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 Financial resource strain: Not on file  . Food insecurity:    Worry: Not on file    Inability: Not on file  . Transportation needs:    Medical: Not on file    Non-medical: Not on file  Tobacco Use  . Smoking status:  Former Smoker    Packs/day: 2.00    Years: 17.00    Pack years: 34.00    Types: Cigarettes    Last attempt to quit: 07/30/1984    Years since quitting: 33.2  . Smokeless tobacco: Never Used  Substance and Sexual Activity  . Alcohol use: Yes    Comment: 08/27/2017 "probably have had 6 beers in the last 6 months; drink when I play golf"  . Drug use: No  . Sexual activity: Never  Lifestyle  . Physical activity:    Days per week: Not on file    Minutes per session: Not on  file  . Stress: Not on file  Relationships  . Social connections:    Talks on phone: Not on file    Gets together: Not on file    Attends religious service: Not on file    Active member of club or organization: Not on file    Attends meetings of clubs or organizations: Not on file    Relationship status: Not on file  . Intimate partner violence:    Fear of current or ex partner: Not on file    Emotionally abused: Not on file    Physically abused: Not on file    Forced sexual activity: Not on file  Other Topics Concern  . Not on file  Social History Narrative   Does not have a living will.   Desires CPR, does want life support if not futile.        BP 140/76   Pulse 69   Ht 5\' 7"  (1.702 m)   Wt 192 lb 6.4 oz (87.3 kg)   BMI 30.13 kg/m   Physical Exam:  Well appearing 72 yo man, NAD HEENT: Unremarkable Neck:  6 cm JVD, no thyromegally Lymphatics:  No adenopathy Back:  No CVA tenderness Lungs:  Clear with no wheezes HEART:  Regular rate rhythm, no murmurs, no rubs, no clicks Abd:  soft, positive bowel sounds, no organomegally, no rebound, no guarding Ext:  2 plus pulses, no edema, no cyanosis, no clubbing Skin:  No rashes no nodules Neuro:  CN II through XII intact, motor grossly intact  EKG - NSR with prior septal MI pattern  DEVICE  Normal device function.  See PaceArt for details.   Assess/Plan: 1, chronic systolic heart failure - the patient c/o dyspnea and is not on any diuretic. We will  start low dose lasix today and uptitrate as his renal function allows. 2. PAF - he is maintaining NSR on amiodarone. I would anticipate stopping this medication in the next few months. 3. CAD - he denies anginal symptoms.  4. ICD - his [employer]. Jude DDD ICD is working normally. Will recheck in several months.  Mikle Bosworth.D.

## 2017-10-29 NOTE — Patient Instructions (Addendum)
Medication Instructions:  Your physician has recommended you make the following change in your medication:  1.  Start taking furosemide 20 mg one tablet by mouth daily. 2.  Start taking carvedilol 3.125 mg one tablet by mouth twice a day.  Labwork: You will need to come to [facility] office in ten days for a BMP. Please schedule.  Testing/Procedures: None ordered.  Follow-Up: Your physician wants you to follow-up in: 3 months with Dr. Lovena Le.    Remote monitoring is used to monitor your ICD from home. This monitoring reduces the number of office visits required to check your device to one time per year. It allows [place] to keep an eye on the functioning of your device to ensure it is working properly. You are scheduled for a device check from home on 12/18/2017. You may send your transmission at any time that day. If you have a wireless device, the transmission will be sent automatically. After your physician reviews your transmission, you will receive a postcard with your next transmission date.  Any Other Special Instructions Will Be Listed Below (If Applicable).  If you need a refill on your cardiac medications before your next appointment, please call your pharmacy.  Furosemide tablets What is this medicine? FUROSEMIDE (fyoor [place] se mide) is a diuretic. It helps you make more urine and to lose salt and excess water from your body. This medicine is used to treat high blood pressure, and edema or swelling from heart, kidney, or liver disease. This medicine may be used for other purposes; ask your health care provider or pharmacist if you have questions. COMMON BRAND NAME(S): Active-Medicated Specimen Kit, Delone, Diuscreen, Lasix, RX Specimen Collection Kit, Specimen Collection Kit, URINX Medicated Specimen Collection What should I tell my health care provider before I take this medicine? They need to know if you have any of these conditions: -abnormal blood electrolytes -diarrhea or  vomiting -gout -heart disease -kidney disease, small amounts of urine, or difficulty passing urine -liver disease -thyroid disease -an unusual or allergic reaction to furosemide, sulfa drugs, other medicines, foods, dyes, or preservatives -pregnant or trying to get pregnant -breast-feeding How should I use this medicine? Take this medicine by mouth with a glass of water. Follow the directions on the prescription label. You may take this medicine with or without food. If it upsets your stomach, take it with food or milk. Do not take your medicine more often than directed. Remember that you will need to pass more urine after taking this medicine. Do not take your medicine at a time of day that will cause you problems. Do not take at bedtime. Talk to your pediatrician regarding the use of this medicine in children. While this drug may be prescribed for selected conditions, precautions do apply. Overdosage: If you think you have taken too much of this medicine contact a poison control center or emergency room at once. NOTE: This medicine is only for you. Do not share this medicine with others. What if I miss a dose? If you miss a dose, take it as soon as you can. If it is almost time for your next dose, take only that dose. Do not take double or extra doses. What may interact with this medicine? -aspirin and aspirin-like medicines -certain antibiotics -chloral hydrate -cisplatin -cyclosporine -digoxin -diuretics -laxatives -lithium -medicines for blood pressure -medicines that relax muscles for surgery -methotrexate -NSAIDs, medicines for pain and inflammation like ibuprofen, naproxen, or indomethacin -phenytoin -steroid medicines like prednisone or cortisone -sucralfate -thyroid  hormones This list may not describe all possible interactions. Give your health care provider a list of all the medicines, herbs, non-prescription drugs, or dietary supplements you use. Also tell them if you  smoke, drink alcohol, or use illegal drugs. Some items may interact with your medicine. What should I watch for while using this medicine? Visit your doctor or health care professional for regular checks on your progress. Check your blood pressure regularly. Ask your doctor or health care professional what your blood pressure should be, and when you should contact him or her. If you are a diabetic, check your blood sugar as directed. You may need to be on a special diet while taking this medicine. Check with your doctor. Also, ask how many glasses of fluid you need to drink a day. You must not get dehydrated. You may get drowsy or dizzy. Do not drive, use machinery, or do anything that needs mental alertness until you know how this drug affects you. Do not stand or sit up quickly, especially if you are an older patient. This reduces the risk of dizzy or fainting spells. Alcohol can make you more drowsy and dizzy. Avoid alcoholic drinks. This medicine can make you more sensitive to the sun. Keep out of the sun. If you cannot avoid being in the sun, wear protective clothing and use sunscreen. Do not use sun lamps or tanning beds/booths. What side effects may I notice from receiving this medicine? Side effects that you should report to your doctor or health care professional as soon as possible: -blood in urine or stools -dry mouth -fever or chills -hearing loss or ringing in the ears -irregular heartbeat -muscle pain or weakness, cramps -skin rash -stomach upset, pain, or nausea -tingling or numbness in the hands or feet -unusually weak or tired -vomiting or diarrhea -yellowing of the eyes or skin Side effects that usually do not require medical attention (report to your doctor or health care professional if they continue or are bothersome): -headache -loss of appetite -unusual bleeding or bruising This list may not describe all possible side effects. Call your doctor for medical advice about  side effects. You may report side effects to FDA at 1-800-FDA-1088. Where should I keep my medicine? Keep out of the reach of children. Store at room temperature between 15 and 30 degrees C (59 and 86 degrees F). Protect from light. Throw away any unused medicine after the expiration date. NOTE: This sheet is a summary. It may not cover all possible information. If you have questions about this medicine, talk to your doctor, pharmacist, or health care provider.  2018 Elsevier/Gold Standard (2014-10-06 13:49:50)   Carvedilol tablets What is this medicine? CARVEDILOL (KAR ve dil ol) is a beta-blocker. Beta-blockers reduce the workload on the heart and help it to beat more regularly. This medicine is used to treat high blood pressure and heart failure. This medicine may be used for other purposes; ask your health care provider or pharmacist if you have questions. COMMON BRAND NAME(S): Coreg What should I tell my health care provider before I take this medicine? They need to know if you have any of these conditions: -circulation problems -diabetes -history of heart attack or heart disease -liver disease -lung or breathing disease, like asthma or emphysema -pheochromocytoma -slow or irregular heartbeat -thyroid disease -an unusual or allergic reaction to carvedilol, other beta-blockers, medicines, foods, dyes, or preservatives -pregnant or trying to get pregnant -breast-feeding How should I use this medicine? Take this medicine by mouth  with a glass of water. Follow the directions on the prescription label. It is best to take the tablets with food. Take your doses at regular intervals. Do not take your medicine more often than directed. Do not stop taking except on the advice of your doctor or health care professional. Talk to your pediatrician regarding the use of this medicine in children. Special care may be needed. Overdosage: If you think you have taken too much of this medicine contact  a poison control center or emergency room at once. NOTE: This medicine is only for you. Do not share this medicine with others. What if I miss a dose? If you miss a dose, take it as soon as you can. If it is almost time for your next dose, take only that dose. Do not take double or extra doses. What may interact with this medicine? This medicine may interact with the following medications: -certain medicines for blood pressure, heart disease, irregular heart beat -certain medicines for depression, like fluoxetine or paroxetine -certain medicines for diabetes, like glipizide or glyburide -cimetidine -clonidine -cyclosporine -digoxin -MAOIs like Carbex, Eldepryl, Marplan, Nardil, and Parnate -reserpine -rifampin This list may not describe all possible interactions. Give your health care provider a list of all the medicines, herbs, non-prescription drugs, or dietary supplements you use. Also tell them if you smoke, drink alcohol, or use illegal drugs. Some items may interact with your medicine. What should I watch for while using this medicine? Check your heart rate and blood pressure regularly while you are taking this medicine. Ask your doctor or health care professional what your heart rate and blood pressure should be, and when you should contact him or her. Do not stop taking this medicine suddenly. This could lead to serious heart-related effects. Contact your doctor or health care professional if you have difficulty breathing while taking this drug. Check your weight daily. Ask your doctor or health care professional when you should notify him/her of any weight gain. You may get drowsy or dizzy. Do not drive, use machinery, or do anything that requires mental alertness until you know how this medicine affects you. To reduce the risk of dizzy or fainting spells, do not sit or stand up quickly. Alcohol can make you more drowsy, and increase flushing and rapid heartbeats. Avoid alcoholic  drinks. If you have diabetes, check your blood sugar as directed. Tell your doctor if you have changes in your blood sugar while you are taking this medicine. If you are going to have surgery, tell your doctor or health care professional that you are taking this medicine. What side effects may I notice from receiving this medicine? Side effects that you should report to your doctor or health care professional as soon as possible: -allergic reactions like skin rash, itching or hives, swelling of the face, lips, or tongue -breathing problems -dark urine -irregular heartbeat -swollen legs or ankles -vomiting -yellowing of the eyes or skin Side effects that usually do not require medical attention (report to your doctor or health care professional if they continue or are bothersome): -change in sex drive or performance -diarrhea -dry eyes (especially if wearing contact lenses) -dry, itching skin -headache -nausea -unusually tired This list may not describe all possible side effects. Call your doctor for medical advice about side effects. You may report side effects to FDA at 1-800-FDA-1088. Where should I keep my medicine? Keep out of the reach of children. Store at room temperature below 30 degrees C (86 degrees F). Protect  from moisture. Keep container tightly closed. Throw away any unused medicine after the expiration date. NOTE: This sheet is a summary. It may not cover all possible information. If you have questions about this medicine, talk to your doctor, pharmacist, or health care provider.  2018 Elsevier/Gold Standard (2013-03-22 14:12:02)

## 2017-11-05 ENCOUNTER — Other Ambulatory Visit: Payer: Self-pay | Admitting: *Deleted

## 2017-11-05 MED ORDER — AMIODARONE HCL 200 MG PO TABS: 200.0000 mg | ORAL_TABLET | Freq: Every day | ORAL | 1 refills | Status: DC

## 2017-11-08 ENCOUNTER — Other Ambulatory Visit: Payer: Medicare HMO

## 2017-11-08 DIAGNOSIS — Z95810 Presence of automatic (implantable) cardiac defibrillator: Secondary | ICD-10-CM

## 2017-11-08 DIAGNOSIS — I10 Essential (primary) hypertension: Secondary | ICD-10-CM | POA: Diagnosis not present

## 2017-11-08 DIAGNOSIS — I5022 Chronic systolic (congestive) heart failure: Secondary | ICD-10-CM

## 2017-11-08 LAB — BASIC METABOLIC PANEL
BUN/Creatinine Ratio: 19 (ref 10–24)
BUN: 32 mg/dL — ABNORMAL HIGH (ref 8–27)
CO2: 25 mmol/L (ref 20–29)
CREATININE: 1.7 mg/dL — AB (ref 0.76–1.27)
Calcium: 9.1 mg/dL (ref 8.6–10.2)
Chloride: 102 mmol/L (ref 96–106)
GFR, EST AFRICAN AMERICAN: 46 mL/min/{1.73_m2} — AB (ref >59)
GFR, EST NON AFRICAN AMERICAN: 39 mL/min/{1.73_m2} — AB (ref >59)
Glucose: 123 mg/dL — ABNORMAL HIGH (ref 65–99)
Potassium: 4.4 mmol/L (ref 3.5–5.2)
SODIUM: 143 mmol/L (ref 134–144)

## 2017-11-12 ENCOUNTER — Telehealth: Payer: Self-pay | Admitting: Internal Medicine

## 2017-11-12 NOTE — Telephone Encounter (Signed)
F/U Call: Patient is returning a call from yesterday.

## 2017-11-12 NOTE — Telephone Encounter (Signed)
Patient called back in response to message left yesterday for him re: lab work.   Accoridng to Dr Tanna Furry ov note on 10/29/17:  Assess/Plan: 1, chronic systolic heart failure - the patient c/o dyspnea and is not on any diuretic. We will start low dose lasix today and uptitrate as his renal function allows   Pt doesn't feel like he's urinating enough.  Weight maintaining within 2-3 pounds (up and down).  SOB going to mailbox, worse.  None at rest.  Lays flat to sleep.  No swelling.  Aware I am forwarding to Dr. Lovena Le and his nurse and he will receive a call back with further recommendations.

## 2017-11-13 ENCOUNTER — Other Ambulatory Visit: Payer: Self-pay | Admitting: Family Medicine

## 2017-11-14 NOTE — Telephone Encounter (Signed)
CVM left per DPR. Advised Pt per Dr. Michaele Offer lasix to 40 mg daily and notify Pt may need to continue to titrate- Advised Pt to take 2 tablets in the morning.  Advised to call office on Monday to let this nurse know if the increase in lasix has helped with dyspnea or if further titration is needed.  Left name and # for call back.

## 2017-12-09 ENCOUNTER — Telehealth: Payer: Self-pay | Admitting: Internal Medicine

## 2017-12-09 NOTE — Telephone Encounter (Signed)
New message:      Pt is calling due to not hearing from a nurse yet. I let pt know you all are still in clinics and he will receive a call soon

## 2017-12-09 NOTE — Telephone Encounter (Signed)
New message    Pt c/o Shortness Of Breath: STAT if SOB developed within the last 24 hours or pt is noticeably SOB on the phone  1. Are you currently SOB (can you hear that pt is SOB on the phone)? NO  2. How long have you been experiencing SOB? SINCE FEB  3. Are you SOB when sitting or when up moving around? MOVING AROUND  4. Are you currently experiencing any other symptoms? NO

## 2017-12-10 NOTE — Telephone Encounter (Signed)
Returned call to Pt.  Pt started on furosemide 20 mg daily at last office visit, was then uptitrated to 40 mg about one month ago.  Pt states he has noticed zero improvement in his breathing.  Will schedule Pt to see Dr. Lovena Le this month to further discuss medication changes.  Pt indicates agreement.   Have asked scheduling to add Pt to May 29th schedule.

## 2017-12-10 NOTE — Telephone Encounter (Signed)
Follow up  Pt verbalized that he is calling again for the rn  He said that he is still waiting for a call back from 12/09/2017   (I did a new SmartPhrase because pt symptoms could have changed)   Pt c/o Shortness Of Breath: STAT if SOB developed within the last 24 hours or pt is noticeably SOB on the phone  1. Are you currently SOB (can you hear that pt is SOB on the phone)? pt verbalized that he is not sob now, no he do not sound sob    2. How long have you been experiencing SOB?  Since Feb 3. Are you SOB when sitting or when up moving around? When pt is up moving around   4. Are you currently experiencing any other symptoms? No other symptoms; pt verbalized that he is on lasix and want to know if he should take more

## 2017-12-18 ENCOUNTER — Ambulatory Visit (INDEPENDENT_AMBULATORY_CARE_PROVIDER_SITE_OTHER): Payer: Medicare HMO | Admitting: *Deleted

## 2017-12-18 DIAGNOSIS — I5022 Chronic systolic (congestive) heart failure: Secondary | ICD-10-CM | POA: Diagnosis not present

## 2017-12-18 DIAGNOSIS — I10 Essential (primary) hypertension: Secondary | ICD-10-CM

## 2017-12-18 NOTE — Progress Notes (Signed)
Remote ICD transmission.   

## 2017-12-25 ENCOUNTER — Encounter: Payer: Self-pay | Admitting: Internal Medicine

## 2017-12-25 ENCOUNTER — Ambulatory Visit: Payer: Medicare HMO | Admitting: Internal Medicine

## 2017-12-25 VITALS — BP 114/62 | HR 58 | Ht 67.0 in | Wt 193.0 lb

## 2017-12-25 DIAGNOSIS — I472 Ventricular tachycardia: Secondary | ICD-10-CM | POA: Diagnosis not present

## 2017-12-25 DIAGNOSIS — R0602 Shortness of breath: Secondary | ICD-10-CM

## 2017-12-25 DIAGNOSIS — Z95810 Presence of automatic (implantable) cardiac defibrillator: Secondary | ICD-10-CM | POA: Diagnosis not present

## 2017-12-25 DIAGNOSIS — I5022 Chronic systolic (congestive) heart failure: Secondary | ICD-10-CM

## 2017-12-25 DIAGNOSIS — I255 Ischemic cardiomyopathy: Secondary | ICD-10-CM

## 2017-12-25 MED ORDER — FUROSEMIDE 40 MG PO TABS: 40.0000 mg | ORAL_TABLET | Freq: Every day | ORAL | 3 refills | Status: DC

## 2017-12-25 NOTE — Patient Instructions (Addendum)
Medication Instructions:  Your physician has recommended you make the following change in your medication:  1.  Start taking lasix 40 mg daily.  You may take 2 tabs of your 20 mg until your new pills arrive.  Labwork: You will get a BMP and sed rate in 10 days. Please schedule.  Testing/Procedures: Your physician has recommended that you have a pulmonary function test. Pulmonary Function Tests are a group of tests that measure how well air moves in and out of your lungs.  Please schedule for next 2 weeks.  Follow-Up: Your physician wants you to follow-up in: 6-8 weeks with Dr. Lovena Le.      Remote monitoring is used to monitor your ICD from home. This monitoring reduces the number of office visits required to check your device to one time per year. It allows [place] to keep an eye on the functioning of your device to ensure it is working properly. You are scheduled for a device check from home on 03/19/2018. You may send your transmission at any time that day. If you have a wireless device, the transmission will be sent automatically. After your physician reviews your transmission, you will receive a postcard with your next transmission date.  Any Other Special Instructions Will Be Listed Below (If Applicable).  If you need a refill on your cardiac medications before your next appointment, please call your pharmacy.     Pulmonary Function Tests Pulmonary function tests (PFTs) are used to measure how well your lungs work, find out what is causing your lung problems, and figure out the best treatment for you. You may have PFTs:  When you have an illness involving the lungs.  To follow changes in your lung function over time if you have a chronic lung disease.  If you are an Nature conservation officer. This checks the effects of being exposed to chemicals over a long period of time.  To check lung function before having surgery or other procedures.  To check your lungs if you smoke.  To  check if prescribed medicines or treatments are helping your lungs.  Your results will be compared to the expected lung function of someone with healthy lungs who is similar to you in:  Age.  Gender.  Height.  Weight.  Race or ethnicity.  This is done to show how your lungs compare to normal lung function (percent predicted). This is how your health care provider knows if your lung function is normal or not. If you have had PFTs done before, your health care provider will compare your current results with past results. This shows if your lung function is better, worse, or the same as before. Tell a health care provider about:  Any allergies you have.  All medicines you are taking, including inhaler or nebulizer medicines, vitamins, herbs, eye drops, creams, and over-the-counter medicines.  Any blood disorders you have.  Any surgeries you have had, especially recent eye surgery, abdominal surgery, or chest surgery. These can make PFTs difficult or unsafe.  Any medical conditions you have, including chest pain or heart problems, tuberculosis, or respiratory infections such as pneumonia, a cold, or the flu.  Any fear of being in closed spaces (claustrophobia). Some of your tests may be in a closed space. What are the risks? Generally, this is a safe procedure. However, problems may occur, including:  Light-headedness due to over-breathing (hyperventilation).  An asthma attack from deep breathing.  A collapsed lung.  What happens before the procedure?  Take over-the-counter  and prescription medicines only as told by your health care provider. If you take inhaler or nebulizer medicines, ask your health care provider which medicines you should take on the day of your testing. Some inhaler medicines may interfere with PFTs if they are taken shortly before the tests.  Follow your health care provider's instructions on eating and drinking restrictions. This may include avoiding eating  large meals and drinking alcohol before the testing.  Do not use any products that contain nicotine or tobacco, such as cigarettes and e-cigarettes. If you need help quitting, ask your health care provider.  Wear comfortable clothing that will not interfere with breathing. What happens during the procedure?  You will be given a soft nose clip to wear. This is done so all of your breaths will go through your mouth instead of your nose.  You will be given a germ-free (sterile) mouthpiece. It will be attached to a machine that measures your breathing (spirometer).  You will be asked to do various breathing maneuvers. The maneuvers will be done by breathing in (inhaling) and breathing out (exhaling). You may be asked to repeat the maneuvers several times before the testing is done.  It is important to follow the instructions exactly to get accurate results. Make sure to blow as hard and as fast as you can when you are told to do so.  You may be given a medicine that makes the small air passages in your lungs larger (bronchodilator) after testing has been done. This medicine will make it easier for you to breathe.  The tests will be repeated after the bronchodilator has taken effect.  You will be monitored carefully during the procedure for faintness, dizziness, trouble breathing, or any other problems. The procedure may vary among health care providers and hospitals. What happens after the procedure?  It is up to you to get your test results. Ask your health care provider, or the department that is doing the tests, when your results will be ready. After you have received your test results, talk with your health care provider about treatment options, if necessary. Summary  Pulmonary function tests (PFTs) are used to measure how well your lungs work, find out what is causing your lung problems, and figure out the best treatment for you.  Wear comfortable clothing that will not interfere with  breathing.  It is up to you to get your test results. After you have received them, talk with your health care provider about treatment options, if necessary. This information is not intended to replace advice given to you by your health care provider. Make sure you discuss any questions you have with your health care provider. Document Released: 03/08/2004 Document Revised: 06/07/2016 Document Reviewed: 06/07/2016 Elsevier Interactive Patient Education  [employer].

## 2017-12-25 NOTE — Progress Notes (Signed)
HPI Randy [name] returns today for followup. He is a pleasant 72 yo man with a h/o CHF, ICM, s/p MI, s/p CABG, severe LV dysfunction, s/p ICD insertion. The patient has had worsening sob and fatigue with exertion. He denies dietary indiscretion. Some cough but not incessant. No palpitations. No edema.  No Known Allergies   Current Outpatient Medications  Medication Sig Dispense Refill  . ACCU-CHEK SOFTCLIX LANCETS lancets Use as instructed to check blood sugar once daily and as needed.  Diagnosis:  E11.21  Non- insulin dependent. 100 each 3  . acetaminophen (TYLENOL) 325 MG tablet Take 2 tablets (650 mg total) by mouth every 6 (six) hours as needed for mild pain.    Marland Kitchen albuterol (PROVENTIL HFA;VENTOLIN HFA) 108 (90 Base) MCG/ACT inhaler Inhale 2 puffs into the lungs every 6 (six) hours as needed for wheezing or shortness of breath. 1 Inhaler 0  . Alcohol Swabs (B-D SINGLE USE SWABS REGULAR) PADS Use as instructed to clean area for glucose monitoring once daily and as needed.  Diagnosis:  E11.21  Non insulin-dependent 100 each 3  . amiodarone (PACERONE) 200 MG tablet Take 1 tablet (200 mg total) by mouth daily. 90 tablet 1  . aspirin EC 325 MG tablet Take 1 tablet (325 mg total) by mouth daily.    Marland Kitchen atorvastatin (LIPITOR) 80 MG tablet Take 1 tablet (80 mg total) by mouth daily at 6 PM. 90 tablet 2  . Blood Glucose Calibration (ACCU-CHEK AVIVA) SOLN Use to calibrate blood glucose machine as recommended.  Diagnosis:  E11.21  Non-insulin dependent. 1 each 2  . carvedilol (COREG) 3.125 MG tablet Take 1 tablet (3.125 mg total) by mouth 2 (two) times daily. 60 tablet 11  . finasteride (PROSCAR) 5 MG tablet Take 1 tablet (5 mg total) by mouth daily. 90 tablet 2  . gabapentin (NEURONTIN) 300 MG capsule Take 2 capsules (600 mg total) by mouth 2 (two) times daily. 360 capsule 2  . glipiZIDE (GLUCOTROL) 5 MG tablet TAKE 1 TABLET EVERY DAY BEFORE BREAKFAST 90 tablet 1  . glucose blood (ACCU-CHEK AVIVA  PLUS) test strip Test Blood Sugar 1 time daily: Dx:E11.27 100 each 3  . lisinopril (PRINIVIL,ZESTRIL) 2.5 MG tablet TAKE 1 TABLET EVERY DAY 90 tablet 1  . tamsulosin (FLOMAX) 0.4 MG CAPS capsule TAKE 1 CAPSULE EVERY DAY 90 capsule 1  . traMADol (ULTRAM) 50 MG tablet Take 1 tablet (50 mg total) by mouth every 6 (six) hours as needed for moderate pain. 30 tablet 0  . furosemide (LASIX) 40 MG tablet Take 1 tablet (40 mg total) by mouth daily. 90 tablet 3   No current facility-administered medications for this visit.      Past Medical History:  Diagnosis Date  . AICD (automatic cardioverter/defibrillator) present 10/18/2016   "replaced 2009-St. Jude device, Dr Lovena Le"  . Anemia   . Arthritis    "back" (08/27/2017)  . CAD (coronary artery disease)   . CHF (congestive heart failure) ([place])   . CKD (chronic kidney disease) stage 3, GFR 30-59 ml/min (HCC)    [name]  . Diverticulosis   . Dyslipidemia   . GERD (gastroesophageal reflux disease)   . Hypertension   . Neuromuscular disorder (HCC)    neuropathy- in hands  . Osteoarthritis    hips, hands  . Pacemaker   . Shortness of breath   . Sleep apnea    started a sleep study but didn't completed, states PCP told him he has sleep  apnea, doesn't use CPAP  . Systolic dysfunction   . Type 2 diabetes with nephropathy ([place])   . Vitamin D deficiency     ROS:   All systems reviewed and negative except as noted in the HPI.   Past Surgical History:  Procedure Laterality Date  . ANTERIOR CERVICAL DECOMP/DISCECTOMY FUSION    . BACK SURGERY    . CARPAL TUNNEL RELEASE Left   . CORONARY ARTERY BYPASS GRAFT N/A 09/02/2017   Procedure: CORONARY ARTERY BYPASS GRAFTING (CABG) x three , using left internal mammary artery and right leg greater saphenous vein harvested endoscopically;  Surgeon: [name], MD;  Location: [facility];  Service: Open Heart Surgery;  Laterality: N/A;  . CYST EXCISION     "back"  . ICD IMPLANT N/A 10/18/2016    Procedure: ICD Implant- Upgrade to Dual Chamber;  Surgeon: Evans Lance, MD;  Location: [facility];  Service: Cardiovascular;  Laterality: N/A;  . INSERT / REPLACE / REMOVE PACEMAKER    . JOINT REPLACEMENT    . LEFT HEART CATH AND CORONARY ANGIOGRAPHY N/A 08/30/2017   Procedure: LEFT HEART CATH AND CORONARY ANGIOGRAPHY;  Surgeon: Martinique, Peter M, MD;  Location: [facility];  Service: Cardiovascular;  Laterality: N/A;  . LUMBAR LAMINECTOMY Left 07/17/2013   Procedure: MICRODISCECTOMY LUMBAR LAMINECTOMY;  Surgeon: Marybelle Killings, MD;  Location: [facility];  Service: Orthopedics;  Laterality: Left;  Left L4 Laminotomy, Lateral Recess Decompression, Cyst Excision  . SHOULDER OPEN ROTATOR CUFF REPAIR Left   . TEE WITHOUT CARDIOVERSION N/A 09/02/2017   Procedure: TRANSESOPHAGEAL ECHOCARDIOGRAM (TEE);  Surgeon: [name], MD;  Location: [facility];  Service: Open Heart Surgery;  Laterality: N/A;  . TOTAL HIP ARTHROPLASTY Left 09/17/2012   Procedure: Left TOTAL HIP ARTHROPLASTY ANTERIOR APPROACH;  Surgeon: Marybelle Killings, MD;  Location: [facility];  Service: Orthopedics;  Laterality: Left;     Family History  Problem Relation Age of Onset  . Heart attack Father 56  . Diabetes Brother      Social History   Socioeconomic History  . Marital status: Divorced    Spouse name: Not on file  . Number of children: 2  . Years of education: Not on file  . Highest education level: Not on file  Occupational History  . 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 Financial resource strain: Not on file  . Food insecurity:    Worry: Not on file    Inability: Not on file  . Transportation needs:    Medical: Not on file    Non-medical: Not on file  Tobacco Use  . Smoking status: Former Smoker    Packs/day: 2.00    Years: 17.00    Pack years: 34.00    Types: Cigarettes    Last attempt to quit: 07/30/1984    Years since quitting: 33.4  . Smokeless tobacco:  Never Used  Substance and Sexual Activity  . Alcohol use: Yes    Comment: 08/27/2017 "probably have had 6 beers in the last 6 months; drink when I play golf"  . Drug use: No  . Sexual activity: Never  Lifestyle  . Physical activity:    Days per week: Not on file    Minutes per session: Not on file  . Stress: Not on file  Relationships  . Social connections:    Talks on phone: Not on file    Gets  together: Not on file    Attends religious service: Not on file    Active member of club or organization: Not on file    Attends meetings of clubs or organizations: Not on file    Relationship status: Not on file  . Intimate partner violence:    Fear of current or ex partner: Not on file    Emotionally abused: Not on file    Physically abused: Not on file    Forced sexual activity: Not on file  Other Topics Concern  . Not on file  Social History Narrative   Does not have a living will.   Desires CPR, does want life support if not futile.        BP 114/62   Pulse (!) 58   Ht _0  (1.702 m)   Wt 193 lb (87.5 kg)   BMI 30.23 kg/m   Physical Exam:  Well appearing NAD HEENT: Unremarkable Neck:  6 cm JVD, no thyromegally Lymphatics:  No adenopathy Back:  No CVA tenderness Lungs:  Clear with no wheezes HEART:  Regular rate rhythm, no murmurs, no rubs, no clicks Abd:  soft, positive bowel sounds, no organomegally, no rebound, no guarding Ext:  2 plus pulses, no edema, no cyanosis, no clubbing Skin:  No rashes no nodules Neuro:  CN II through XII intact, motor grossly intact  EKG - nsr with old AS MI  DEVICE  Normal device function.  See PaceArt for details.   Assess/Plan: 1. Worsening dyspnea - etiology is unclear. As he has been on amiodarone, I am concerned about lung problems and we will ask him to check an ESR and PFTs. Also, I have asked him to increase his lasix to 40 mg daily. If no evidence of fibrosis, might consider switching to [facility]. 2. ICD - his St. Jude  DDD ICD is working normally. We will recheck in several months. 3. ICM - he denies any anginal symptoms. He is still playing golf. 4.  VT - he has not had any additional ICD therapies. We will continue his current meds for now.  Randy [name].D.

## 2017-12-31 LAB — CUP PACEART INCLINIC DEVICE CHECK
Battery Remaining Longevity: 92 mo
Brady Statistic RA Percent Paced: 1.1 %
Brady Statistic RV Percent Paced: 0.29 %
HighPow Impedance: 69.75 Ohm
Implantable Lead Implant Date: 20091022
Implantable Lead Location: 753859
Implantable Pulse Generator Implant Date: 20180322
Lead Channel Impedance Value: 312.5 Ohm
Lead Channel Impedance Value: 912.5 Ohm
Lead Channel Pacing Threshold Amplitude: 1.25 V
Lead Channel Pacing Threshold Pulse Width: 0.6 ms
Lead Channel Sensing Intrinsic Amplitude: 1.2 mV
Lead Channel Setting Pacing Amplitude: 2.25 V
Lead Channel Setting Pacing Amplitude: 2.5 V
MDC IDC LEAD IMPLANT DT: 20180322
MDC IDC LEAD LOCATION: 753860
MDC IDC MSMT LEADCHNL RA PACING THRESHOLD AMPLITUDE: 0.5 V
MDC IDC MSMT LEADCHNL RA PACING THRESHOLD PULSEWIDTH: 0.5 ms
MDC IDC MSMT LEADCHNL RV SENSING INTR AMPL: 12 mV
MDC IDC PG SERIAL: 7413245
MDC IDC SESS DTM: 20190529121659
MDC IDC SET LEADCHNL RV PACING PULSEWIDTH: 0.6 ms
MDC IDC SET LEADCHNL RV SENSING SENSITIVITY: 0.5 mV

## 2018-01-06 ENCOUNTER — Other Ambulatory Visit: Payer: Medicare HMO | Admitting: *Deleted

## 2018-01-06 DIAGNOSIS — R0602 Shortness of breath: Secondary | ICD-10-CM | POA: Diagnosis not present

## 2018-01-06 DIAGNOSIS — I255 Ischemic cardiomyopathy: Secondary | ICD-10-CM

## 2018-01-06 DIAGNOSIS — I5022 Chronic systolic (congestive) heart failure: Secondary | ICD-10-CM

## 2018-01-06 DIAGNOSIS — Z95810 Presence of automatic (implantable) cardiac defibrillator: Secondary | ICD-10-CM | POA: Diagnosis not present

## 2018-01-06 LAB — CUP PACEART REMOTE DEVICE CHECK
Battery Remaining Percentage: 86 %
Battery Voltage: 3.01 V
Brady Statistic AP VP Percent: 1 %
Brady Statistic AS VP Percent: 1 %
Brady Statistic RA Percent Paced: 1 %
Brady Statistic RV Percent Paced: 1 %
HighPow Impedance: 70 Ohm
HighPow Impedance: 70 Ohm
Implantable Lead Implant Date: 20180322
Implantable Lead Location: 753859
Implantable Lead Location: 753860
Implantable Lead Model: 7121
Implantable Pulse Generator Implant Date: 20180322
Lead Channel Impedance Value: 310 Ohm
Lead Channel Impedance Value: 840 Ohm
Lead Channel Pacing Threshold Amplitude: 0.5 V
Lead Channel Pacing Threshold Amplitude: 1.25 V
Lead Channel Pacing Threshold Pulse Width: 0.5 ms
Lead Channel Pacing Threshold Pulse Width: 0.6 ms
Lead Channel Sensing Intrinsic Amplitude: 12 mV
Lead Channel Setting Pacing Amplitude: 2.25 V
Lead Channel Setting Pacing Amplitude: 2.5 V
Lead Channel Setting Pacing Pulse Width: 0.6 ms
MDC IDC LEAD IMPLANT DT: 20091022
MDC IDC MSMT BATTERY REMAINING LONGEVITY: 85 mo
MDC IDC MSMT LEADCHNL RA SENSING INTR AMPL: 1.3 mV
MDC IDC PG SERIAL: 7413245
MDC IDC SESS DTM: 20190522061820
MDC IDC SET LEADCHNL RV SENSING SENSITIVITY: 0.5 mV
MDC IDC STAT BRADY AP VS PERCENT: 1.1 %
MDC IDC STAT BRADY AS VS PERCENT: 98 %

## 2018-01-06 LAB — BASIC METABOLIC PANEL
BUN/Creatinine Ratio: 14 (ref 10–24)
BUN: 30 mg/dL — AB (ref 8–27)
CALCIUM: 9.3 mg/dL (ref 8.6–10.2)
CHLORIDE: 99 mmol/L (ref 96–106)
CO2: 28 mmol/L (ref 20–29)
Creatinine, Ser: 2.09 mg/dL — ABNORMAL HIGH (ref 0.76–1.27)
GFR, EST AFRICAN AMERICAN: 35 mL/min/{1.73_m2} — AB (ref >59)
GFR, EST NON AFRICAN AMERICAN: 31 mL/min/{1.73_m2} — AB (ref >59)
Glucose: 146 mg/dL — ABNORMAL HIGH (ref 65–99)
Potassium: 4.3 mmol/L (ref 3.5–5.2)
Sodium: 142 mmol/L (ref 134–144)

## 2018-01-06 LAB — SEDIMENTATION RATE: SED RATE: 12 mm/h (ref 0–30)

## 2018-01-07 ENCOUNTER — Telehealth: Payer: Self-pay

## 2018-01-07 NOTE — Telephone Encounter (Signed)
Referred to ICM clinic by Levander Campion at last visit with Dr Lovena Le.  ICM intro given and patient agreed to monthly ICM follow up.  1st ICM remote transmission scheduled for 01/23/2018.  He has visit with pulmonology this week due to he gets [place] of breath when walking to mail box.  The shortness of breath has been ongoing since last surgery.  Provided ICM number and encouraged to call if he develops fluid symptoms.  He has monitor by bedside.  Dr Lovena Le is primary cardiologist.

## 2018-01-09 ENCOUNTER — Ambulatory Visit (INDEPENDENT_AMBULATORY_CARE_PROVIDER_SITE_OTHER): Payer: Medicare HMO | Admitting: Internal Medicine

## 2018-01-09 DIAGNOSIS — R0602 Shortness of breath: Secondary | ICD-10-CM | POA: Diagnosis not present

## 2018-01-09 LAB — PULMONARY FUNCTION TEST
DL/VA % PRED: 91 %
DL/VA: 4.02 ml/min/mmHg/L
DLCO unc % pred: 69 %
DLCO unc: 19.65 ml/min/mmHg
FEF 25-75 POST: 1.25 L/s
FEF 25-75 Pre: 0.79 L/sec
FEF2575-%Change-Post: 58 %
FEF2575-%Pred-Post: 59 %
FEF2575-%Pred-Pre: 37 %
FEV1-%Change-Post: 14 %
FEV1-%PRED-PRE: 50 %
FEV1-%Pred-Post: 58 %
FEV1-POST: 1.63 L
FEV1-PRE: 1.42 L
FEV1FVC-%CHANGE-POST: 0 %
FEV1FVC-%Pred-Pre: 91 %
FEV6-%Change-Post: 15 %
FEV6-%PRED-PRE: 58 %
FEV6-%Pred-Post: 67 %
FEV6-POST: 2.44 L
FEV6-PRE: 2.12 L
FEV6FVC-%PRED-POST: 106 %
FEV6FVC-%PRED-PRE: 106 %
FVC-%Change-Post: 15 %
FVC-%PRED-PRE: 55 %
FVC-%Pred-Post: 63 %
FVC-POST: 2.44 L
FVC-PRE: 2.12 L
POST FEV6/FVC RATIO: 100 %
PRE FEV6/FVC RATIO: 100 %
Post FEV1/FVC ratio: 67 %
Pre FEV1/FVC ratio: 67 %
RV % PRED: 209 %
RV: 4.88 L
TLC % PRED: 109 %
TLC: 7.06 L

## 2018-01-09 NOTE — Progress Notes (Signed)
Patient completed full PFT today. 

## 2018-01-23 ENCOUNTER — Ambulatory Visit (INDEPENDENT_AMBULATORY_CARE_PROVIDER_SITE_OTHER): Payer: Medicare HMO

## 2018-01-23 ENCOUNTER — Telehealth: Payer: Self-pay

## 2018-01-23 DIAGNOSIS — I5022 Chronic systolic (congestive) heart failure: Secondary | ICD-10-CM

## 2018-01-23 DIAGNOSIS — Z95810 Presence of automatic (implantable) cardiac defibrillator: Secondary | ICD-10-CM

## 2018-01-23 NOTE — Telephone Encounter (Signed)
I spoke with the patient about this.

## 2018-01-23 NOTE — Progress Notes (Signed)
EPIC Encounter for ICM Monitoring  Patient Name: Randy [name] is a 72 y.o. male Date: 01/23/2018 Primary Care Physican: Lucille Passy, MD Primary Cardiologist: Lovena Le Electrophysiologist: Lovena Le Dry Weight: 192 lbs baseline       1st ICM remote transmission. Heart Failure questions reviewed, pt asymptomatic.  He stated he is feeling fine and denied any fluid symptoms   Thoracic impedance normal.  Prescribed dosage: Furosemide 40 mg 1 tablet daily  Recommendations: No changes.   Encouraged to call for fluid symptoms.  Follow-up plan: ICM clinic phone appointment on 03/10/2018.  Office appointment scheduled 02/04/2018 with Dr. Lovena Le.  Copy of ICM check sent to Dr. Lovena Le.   3 month ICM trend: 01/23/2018    1 Year ICM trend:       Rosalene Billings, RN 01/23/2018 4:59 PM

## 2018-01-24 ENCOUNTER — Encounter: Payer: Self-pay | Admitting: Internal Medicine

## 2018-02-04 ENCOUNTER — Ambulatory Visit: Payer: Medicare HMO | Admitting: Internal Medicine

## 2018-02-04 ENCOUNTER — Encounter (INDEPENDENT_AMBULATORY_CARE_PROVIDER_SITE_OTHER): Payer: Self-pay

## 2018-02-04 ENCOUNTER — Encounter: Payer: Self-pay | Admitting: Internal Medicine

## 2018-02-04 VITALS — BP 102/54 | HR 62 | Ht 67.0 in | Wt 192.0 lb

## 2018-02-04 DIAGNOSIS — I10 Essential (primary) hypertension: Secondary | ICD-10-CM | POA: Diagnosis not present

## 2018-02-04 DIAGNOSIS — R0602 Shortness of breath: Secondary | ICD-10-CM | POA: Diagnosis not present

## 2018-02-04 DIAGNOSIS — I255 Ischemic cardiomyopathy: Secondary | ICD-10-CM

## 2018-02-04 DIAGNOSIS — I5022 Chronic systolic (congestive) heart failure: Secondary | ICD-10-CM | POA: Diagnosis not present

## 2018-02-04 DIAGNOSIS — Z95810 Presence of automatic (implantable) cardiac defibrillator: Secondary | ICD-10-CM

## 2018-02-04 DIAGNOSIS — Z951 Presence of aortocoronary bypass graft: Secondary | ICD-10-CM

## 2018-02-04 LAB — CUP PACEART INCLINIC DEVICE CHECK
Battery Remaining Longevity: 91 mo
HighPow Impedance: 69.75 Ohm
Implantable Lead Implant Date: 20180322
Implantable Lead Model: 7121
Implantable Pulse Generator Implant Date: 20180322
Lead Channel Pacing Threshold Amplitude: 0.75 V
Lead Channel Pacing Threshold Amplitude: 1.25 V
Lead Channel Pacing Threshold Pulse Width: 0.6 ms
Lead Channel Sensing Intrinsic Amplitude: 1.2 mV
Lead Channel Setting Pacing Amplitude: 2.25 V
Lead Channel Setting Pacing Amplitude: 2.5 V
Lead Channel Setting Pacing Pulse Width: 0.6 ms
Lead Channel Setting Sensing Sensitivity: 0.5 mV
MDC IDC LEAD IMPLANT DT: 20091022
MDC IDC LEAD LOCATION: 753859
MDC IDC LEAD LOCATION: 753860
MDC IDC MSMT LEADCHNL RA IMPEDANCE VALUE: 312.5 Ohm
MDC IDC MSMT LEADCHNL RA PACING THRESHOLD PULSEWIDTH: 0.5 ms
MDC IDC MSMT LEADCHNL RV IMPEDANCE VALUE: 950 Ohm
MDC IDC MSMT LEADCHNL RV SENSING INTR AMPL: 12 mV
MDC IDC PG SERIAL: 7413245
MDC IDC SESS DTM: 20190709131337
MDC IDC STAT BRADY RA PERCENT PACED: 2 %
MDC IDC STAT BRADY RV PERCENT PACED: 0.29 %

## 2018-02-04 MED ORDER — SACUBITRIL-VALSARTAN 24-26 MG PO TABS: 1.0000 | ORAL_TABLET | Freq: Two times a day (BID) | ORAL | 11 refills | Status: DC

## 2018-02-04 NOTE — Patient Instructions (Addendum)
Medication Instructions:  Your physician has recommended you make the following change in your medication:  1.  Stop taking your lisinopril today-wait 36 hours before starting Entresto 2.  Thursday AM 02/06/2018 start taking Entresto 24-26mg  one tablet by mouth twice a day.  Labwork: None ordered.  Testing/Procedures: Please follow up with pharmacy in 2 weeks for Entresto titration.  Follow-Up: Your physician wants you to follow-up in: 3 months with Dr. Lovena Le.     Remote monitoring is used to monitor your ICD from home. This monitoring reduces the number of office visits required to check your device to one time per year. It allows [place] to keep an eye on the functioning of your device to ensure it is working properly. You are scheduled for a device check from home on 03/10/2018. You may send your transmission at any time that day. If you have a wireless device, the transmission will be sent automatically. After your physician reviews your transmission, you will receive a postcard with your next transmission date.  Any Other Special Instructions Will Be Listed Below (If Applicable).  If you need a refill on your cardiac medications before your next appointment, please call your pharmacy.   Sacubitril; Valsartan oral tablet What is this medicine? SACUBITRIL; VALSARTAN (sak UE bi tril; val SAR tan) is a combination of 2 drugs used to reduce the risk of death and hospitalizations in people with long-lasting heart failure. It is usually used with other medicines to treat heart failure. This medicine may be used for other purposes; ask your health care provider or pharmacist if you have questions. COMMON BRAND NAME(S): Entresto What should I tell my health care provider before I take this medicine? They need to know if you have any of these conditions: -diabetes and take a medicine that contains aliskiren -kidney disease -liver disease -an unusual or allergic reaction to sacubitril; valsartan,  drugs called angiotensin converting enzyme (ACE) inhibitors, angiotensin II receptor blockers (ARBs), other medicines, foods, dyes, or preservatives -pregnant or trying to get pregnant -breast-feeding How should I use this medicine? Take this medicine by mouth with a glass of water. Follow the directions on the prescription label. You can take it with or without food. If it upsets your stomach, take it with food. Take your medicine at regular intervals. Do not take it more often than directed. Do not stop taking except on your doctor's advice. Do not take this medicine for at least 36 hours before or after you take an ACE inhibitor medicine. Talk to your health care provider if you are not sure if you take an ACE inhibitor. Talk to your pediatrician regarding the use of this medicine in children. Special care may be needed. Overdosage: If you think you have taken too much of this medicine contact a poison control center or emergency room at once. NOTE: This medicine is only for you. Do not share this medicine with others. What if I miss a dose? If you miss a dose, take it as soon as you can. If it is almost time for next dose, take only that dose. Do not take double or extra doses. What may interact with this medicine? Do not take this medicine with any of the following medicines: -aliskiren if you have diabetes -angiotensin-converting enzyme (ACE) inhibitors, like benazepril, captopril, enalapril, fosinopril, lisinopril, or ramipril This medicine may also interact with the following medicines: -angiotensin II receptor blockers (ARBs) like azilsartan, candesartan, eprosartan, irbesartan, losartan, olmesartan, telmisartan, or valsartan -lithium -NSAIDS, medicines for pain  and inflammation, like ibuprofen or naproxen -potassium-sparing diuretics like amiloride, spironolactone, and triamterene -potassium supplements This list may not describe all possible interactions. Give your health care  provider a list of all the medicines, herbs, non-prescription drugs, or dietary supplements you use. Also tell them if you smoke, drink alcohol, or use illegal drugs. Some items may interact with your medicine. What should I watch for while using this medicine? Tell your doctor or healthcare professional if your symptoms do not start to get better or if they get worse. Do not become pregnant while taking this medicine. Women should inform their doctor if they wish to become pregnant or think they might be pregnant. There is a potential for serious side effects to an unborn child. Talk to your health care professional or pharmacist for more information. You may get dizzy. Do not drive, use machinery, or do anything that needs mental alertness until you know how this medicine affects you. Do not stand or sit up quickly, especially if you are an older patient. This reduces the risk of dizzy or fainting spells. Avoid alcoholic drinks; they can make you more dizzy. What side effects may I notice from receiving this medicine? Side effects that you should report to your doctor or health care professional as soon as possible: -allergic reactions like skin rash, itching or hives, swelling of the face, lips, or tongue -signs and symptoms of increased potassium like muscle weakness; chest pain; or fast, irregular heartbeat -signs and symptoms of kidney injury like trouble passing urine or change in the amount of urine -signs and symptoms of low blood pressure like feeling dizzy or lightheaded, or if you develop extreme fatigue Side effects that usually do not require medical attention (report to your doctor or health care professional if they continue or are bothersome): -cough This list may not describe all possible side effects. Call your doctor for medical advice about side effects. You may report side effects to FDA at 1-800-FDA-1088. Where should I keep my medicine? Keep out of the reach of children. Store  at room temperature between 15 and 30 degrees C (59 and 86 degrees F). Throw away any unused medicine after the expiration date. NOTE: This sheet is a summary. It may not cover all possible information. If you have questions about this medicine, talk to your doctor, pharmacist, or health care provider.  2018 Elsevier/Gold Standard (2015-08-31 13:54:19)

## 2018-02-04 NOTE — Progress Notes (Signed)
HPI [name] returns today for followup. He is a pleasant 72yo man with chronic systolic heart failure, CAD, s/p CABG and VT. We had him stop his amio when I saw him last although PFT's and ESR were not particularly concerning for amio lung toxicity. He is a little better but still has dyspnea and bendopnea. No Known Allergies   Current Outpatient Medications  Medication Sig Dispense Refill  . ACCU-CHEK SOFTCLIX LANCETS lancets Use as instructed to check blood sugar once daily and as needed.  Diagnosis:  E11.21  Non- insulin dependent. 100 each 3  . acetaminophen (TYLENOL) 325 MG tablet Take 2 tablets (650 mg total) by mouth every 6 (six) hours as needed for mild pain.    Marland Kitchen albuterol (PROVENTIL HFA;VENTOLIN HFA) 108 (90 Base) MCG/ACT inhaler Inhale 2 puffs into the lungs every 6 (six) hours as needed for wheezing or shortness of breath. 1 Inhaler 0  . Alcohol Swabs (B-D SINGLE USE SWABS REGULAR) PADS Use as instructed to clean area for glucose monitoring once daily and as needed.  Diagnosis:  E11.21  Non insulin-dependent 100 each 3  . amiodarone (PACERONE) 200 MG tablet Take 1 tablet (200 mg total) by mouth daily. 90 tablet 1  . aspirin EC 325 MG tablet Take 1 tablet (325 mg total) by mouth daily.    Marland Kitchen atorvastatin (LIPITOR) 80 MG tablet Take 1 tablet (80 mg total) by mouth daily at 6 PM. 90 tablet 2  . Blood Glucose Calibration (ACCU-CHEK AVIVA) SOLN Use to calibrate blood glucose machine as recommended.  Diagnosis:  E11.21  Non-insulin dependent. 1 each 2  . finasteride (PROSCAR) 5 MG tablet Take 1 tablet (5 mg total) by mouth daily. 90 tablet 2  . furosemide (LASIX) 40 MG tablet Take 1 tablet (40 mg total) by mouth daily. 90 tablet 3  . gabapentin (NEURONTIN) 300 MG capsule Take 2 capsules (600 mg total) by mouth 2 (two) times daily. 360 capsule 2  . glipiZIDE (GLUCOTROL) 5 MG tablet TAKE 1 TABLET EVERY DAY BEFORE BREAKFAST 90 tablet 1  . glucose blood (ACCU-CHEK AVIVA PLUS) test  strip Test Blood Sugar 1 time daily: Dx:E11.27 100 each 3  . lisinopril (PRINIVIL,ZESTRIL) 2.5 MG tablet TAKE 1 TABLET EVERY DAY 90 tablet 1  . tamsulosin (FLOMAX) 0.4 MG CAPS capsule TAKE 1 CAPSULE EVERY DAY 90 capsule 1  . traMADol (ULTRAM) 50 MG tablet Take 1 tablet (50 mg total) by mouth every 6 (six) hours as needed for moderate pain. 30 tablet 0  . carvedilol (COREG) 3.125 MG tablet Take 1 tablet (3.125 mg total) by mouth 2 (two) times daily. 60 tablet 11   No current facility-administered medications for this visit.      Past Medical History:  Diagnosis Date  . AICD (automatic cardioverter/defibrillator) present 10/18/2016   "replaced 2009-St. Jude device, Dr Lovena Le"  . Anemia   . Arthritis    "back" (08/27/2017)  . CAD (coronary artery disease)   . CHF (congestive heart failure) ([place])   . CKD (chronic kidney disease) stage 3, GFR 30-59 ml/min (HCC)    [name]  . Diverticulosis   . Dyslipidemia   . GERD (gastroesophageal reflux disease)   . Hypertension   . Neuromuscular disorder (HCC)    neuropathy- in hands  . Osteoarthritis    hips, hands  . Pacemaker   . Shortness of breath   . Sleep apnea    started a sleep study but didn't completed, states PCP told  him he has sleep  apnea, doesn't use CPAP  . Systolic dysfunction   . Type 2 diabetes with nephropathy ([place])   . Vitamin D deficiency     ROS:   All systems reviewed and negative except as noted in the HPI.   Past Surgical History:  Procedure Laterality Date  . ANTERIOR CERVICAL DECOMP/DISCECTOMY FUSION    . BACK SURGERY    . CARPAL TUNNEL RELEASE Left   . CORONARY ARTERY BYPASS GRAFT N/A 09/02/2017   Procedure: CORONARY ARTERY BYPASS GRAFTING (CABG) x three , using left internal mammary artery and right leg greater saphenous vein harvested endoscopically;  Surgeon: [name], MD;  Location: [facility];  Service: Open Heart Surgery;  Laterality: N/A;  . CYST EXCISION     "back"  . ICD IMPLANT N/A 10/18/2016    Procedure: ICD Implant- Upgrade to Dual Chamber;  Surgeon: Evans Lance, MD;  Location: [facility];  Service: Cardiovascular;  Laterality: N/A;  . INSERT / REPLACE / REMOVE PACEMAKER    . JOINT REPLACEMENT    . LEFT HEART CATH AND CORONARY ANGIOGRAPHY N/A 08/30/2017   Procedure: LEFT HEART CATH AND CORONARY ANGIOGRAPHY;  Surgeon: Martinique, Peter M, MD;  Location: [facility];  Service: Cardiovascular;  Laterality: N/A;  . LUMBAR LAMINECTOMY Left 07/17/2013   Procedure: MICRODISCECTOMY LUMBAR LAMINECTOMY;  Surgeon: Marybelle Killings, MD;  Location: [facility];  Service: Orthopedics;  Laterality: Left;  Left L4 Laminotomy, Lateral Recess Decompression, Cyst Excision  . SHOULDER OPEN ROTATOR CUFF REPAIR Left   . TEE WITHOUT CARDIOVERSION N/A 09/02/2017   Procedure: TRANSESOPHAGEAL ECHOCARDIOGRAM (TEE);  Surgeon: [name], MD;  Location: [facility];  Service: Open Heart Surgery;  Laterality: N/A;  . TOTAL HIP ARTHROPLASTY Left 09/17/2012   Procedure: Left TOTAL HIP ARTHROPLASTY ANTERIOR APPROACH;  Surgeon: Marybelle Killings, MD;  Location: [facility];  Service: Orthopedics;  Laterality: Left;     Family History  Problem Relation Age of Onset  . Heart attack Father 34  . Diabetes Brother      Social History   Socioeconomic History  . Marital status: Divorced    Spouse name: Not on file  . Number of children: 2  . Years of education: Not on file  . Highest education level: Not on file  Occupational History  . 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 Financial resource strain: Not on file  . Food insecurity:    Worry: Not on file    Inability: Not on file  . Transportation needs:    Medical: Not on file    Non-medical: Not on file  Tobacco Use  . Smoking status: Former Smoker    Packs/day: 2.00    Years: 17.00    Pack years: 34.00    Types: Cigarettes    Last attempt to quit: 07/30/1984    Years since quitting: 33.5  . Smokeless tobacco:  Never Used  Substance and Sexual Activity  . Alcohol use: Yes    Comment: 08/27/2017 "probably have had 6 beers in the last 6 months; drink when I play golf"  . Drug use: No  . Sexual activity: Never  Lifestyle  . Physical activity:    Days per week: Not on file    Minutes per session: Not on file  . Stress: Not on file  Relationships  . Social connections:    Talks on phone: Not on  file    Gets together: Not on file    Attends religious service: Not on file    Active member of club or organization: Not on file    Attends meetings of clubs or organizations: Not on file    Relationship status: Not on file  . Intimate partner violence:    Fear of current or ex partner: Not on file    Emotionally abused: Not on file    Physically abused: Not on file    Forced sexual activity: Not on file  Other Topics Concern  . Not on file  Social History Narrative   Does not have a living will.   Desires CPR, does want life support if not futile.        There were no vitals taken for this visit.  Physical Exam:  stable appearing NAD HEENT: Unremarkable Neck:  6 cm JVD, no thyromegally Lymphatics:  No adenopathy Back:  No CVA tenderness Lungs:  Clear with no wheezes HEART:  Regular rate rhythm, no murmurs, no rubs, no clicks Abd:  soft, positive bowel sounds, no organomegally, no rebound, no guarding Ext:  2 plus pulses, no edema, no cyanosis, no clubbing Skin:  No rashes no nodules Neuro:  CN II through XII intact, motor grossly intact  EKG - nsr with old AS MI  DEVICE  Normal device function.  See PaceArt for details.   Assess/Plan: 1. Chronic systolic heart failure - his symptoms are class 2-3. I have asked him to try switching from lisinopril to [facility]. 2. VT - he is off of amio and has not yet had any VT. We will follow. At this point we will try and avoid amio if possible. 3. ICD - his St. Jude DDD ICD is working normally. Will recheck in several months.  Mikle Bosworth.D.

## 2018-02-07 ENCOUNTER — Telehealth: Payer: Self-pay

## 2018-02-07 NOTE — Telephone Encounter (Signed)
I have done an [place] PA through covermymeds. Key: [id number]

## 2018-02-10 NOTE — Telephone Encounter (Signed)
I received the following message through covermymeds:  Shawna Clamp (Key: [id number])  Rx #: [id number]  Entresto 24-26MG  tablets     Message from Plan PA Case: [id number], Status: Approved, Coverage Starts on: 02/07/2018 12:00:00 AM, Coverage Ends on: 02/08/2020 12:00:00 AM.

## 2018-02-19 ENCOUNTER — Ambulatory Visit (INDEPENDENT_AMBULATORY_CARE_PROVIDER_SITE_OTHER): Payer: Medicare HMO | Admitting: Pharmacist

## 2018-02-19 VITALS — BP 106/58 | HR 61

## 2018-02-19 DIAGNOSIS — I5022 Chronic systolic (congestive) heart failure: Secondary | ICD-10-CM | POA: Diagnosis not present

## 2018-02-19 LAB — BASIC METABOLIC PANEL
BUN / CREAT RATIO: 19 (ref 10–24)
BUN: 42 mg/dL — ABNORMAL HIGH (ref 8–27)
CO2: 24 mmol/L (ref 20–29)
CREATININE: 2.24 mg/dL — AB (ref 0.76–1.27)
Calcium: 9 mg/dL (ref 8.6–10.2)
Chloride: 99 mmol/L (ref 96–106)
GFR calc Af Amer: 33 mL/min/{1.73_m2} — ABNORMAL LOW (ref >59)
GFR, EST NON AFRICAN AMERICAN: 28 mL/min/{1.73_m2} — AB (ref >59)
Glucose: 195 mg/dL — ABNORMAL HIGH (ref 65–99)
Potassium: 5.1 mmol/L (ref 3.5–5.2)
SODIUM: 139 mmol/L (ref 134–144)

## 2018-02-19 MED ORDER — SACUBITRIL-VALSARTAN 24-26 MG PO TABS: 1.0000 | ORAL_TABLET | Freq: Two times a day (BID) | ORAL | 0 refills | Status: DC

## 2018-02-19 NOTE — Progress Notes (Signed)
Patient ID: Randy [name]                 DOB: [birth year]                      MRN: [id number]     HPI: Randy [name] is a pleasant 72 y.o. male referred by Dr. Lovena Le to pharmacy clinic for HF medication optimization. PMH is significant for HFrEF with LVEF of 35-40% on 08/2017 echo, CAD s/p CABG, CKD, DM2, HLD, HTN, and VT. At last visit 2 weeks ago, he was switched from lisinopril to [place].   Pt presents to clinic today with his wife. He reports tolerating his Entresto well. He denies blurred vision, falls, or headache. He does have some dizziness when he bends over and stands back up, although reports that he has had this for years. He monitors his BP at home using a bicep cuff that he has had for many years. Recalls a home reading of 112/60. He has been using Entresto samples so far but confirmed with [employer] that his monthly copay will be $45 per month.  Current HTN meds: carvedilol 3.125mg  BID, Entresto 24-26mg  BID  BP goal: <130/105mmHg  Family History: Father with MI, brother with DM.  Social History: Former smoker 2 PPD for 17 years, quit in 1986. Occasional alcohol use, denies illicit drug use.  Diet: Likes salads, tomato salads, hamburgers, chicken, cookies and ice cream. Does not add salt to food.  Exercise: SOB when walking to the mailbox.   Home BP readings: 112/60  Wt Readings from Last 3 Encounters:  02/04/18 192 lb (87.1 kg)  12/25/17 193 lb (87.5 kg)  10/29/17 192 lb 6.4 oz (87.3 kg)   BP Readings from Last 3 Encounters:  02/04/18 (!) 102/54  12/25/17 114/62  10/29/17 140/76   Pulse Readings from Last 3 Encounters:  02/04/18 62  12/25/17 (!) 58  10/29/17 69    Renal function: CrCl cannot be calculated (Patient's most recent lab result is older than the maximum 21 days allowed.).  Past Medical History:  Diagnosis Date  . AICD (automatic cardioverter/defibrillator) present 10/18/2016   "replaced 2009-St. Jude device, Dr Lovena Le"  . Anemia   . Arthritis     "back" (08/27/2017)  . CAD (coronary artery disease)   . CHF (congestive heart failure) ([place])   . CKD (chronic kidney disease) stage 3, GFR 30-59 ml/min (HCC)    [name]  . Diverticulosis   . Dyslipidemia   . GERD (gastroesophageal reflux disease)   . Hypertension   . Neuromuscular disorder (HCC)    neuropathy- in hands  . Osteoarthritis    hips, hands  . Pacemaker   . Shortness of breath   . Sleep apnea    started a sleep study but didn't completed, states PCP told him he has sleep  apnea, doesn't use CPAP  . Systolic dysfunction   . Type 2 diabetes with nephropathy ([place])   . Vitamin D deficiency     Current Outpatient Medications on File Prior to Visit  Medication Sig Dispense Refill  . ACCU-CHEK SOFTCLIX LANCETS lancets Use as instructed to check blood sugar once daily and as needed.  Diagnosis:  E11.21  Non- insulin dependent. 100 each 3  . acetaminophen (TYLENOL) 325 MG tablet Take 2 tablets (650 mg total) by mouth every 6 (six) hours as needed for mild pain.    Marland Kitchen albuterol (PROVENTIL HFA;VENTOLIN HFA) 108 (90 Base) MCG/ACT inhaler Inhale 2 puffs into  the lungs every 6 (six) hours as needed for wheezing or shortness of breath. 1 Inhaler 0  . Alcohol Swabs (B-D SINGLE USE SWABS REGULAR) PADS Use as instructed to clean area for glucose monitoring once daily and as needed.  Diagnosis:  E11.21  Non insulin-dependent 100 each 3  . amiodarone (PACERONE) 200 MG tablet Take 1 tablet (200 mg total) by mouth daily. 90 tablet 1  . aspirin EC 325 MG tablet Take 1 tablet (325 mg total) by mouth daily.    Marland Kitchen atorvastatin (LIPITOR) 80 MG tablet Take 1 tablet (80 mg total) by mouth daily at 6 PM. 90 tablet 2  . Blood Glucose Calibration (ACCU-CHEK AVIVA) SOLN Use to calibrate blood glucose machine as recommended.  Diagnosis:  E11.21  Non-insulin dependent. 1 each 2  . carvedilol (COREG) 3.125 MG tablet Take 1 tablet (3.125 mg total) by mouth 2 (two) times daily. 60 tablet 11  . finasteride  (PROSCAR) 5 MG tablet Take 1 tablet (5 mg total) by mouth daily. 90 tablet 2  . furosemide (LASIX) 40 MG tablet Take 1 tablet (40 mg total) by mouth daily. 90 tablet 3  . gabapentin (NEURONTIN) 300 MG capsule Take 2 capsules (600 mg total) by mouth 2 (two) times daily. 360 capsule 2  . glipiZIDE (GLUCOTROL) 5 MG tablet TAKE 1 TABLET EVERY DAY BEFORE BREAKFAST 90 tablet 1  . glucose blood (ACCU-CHEK AVIVA PLUS) test strip Test Blood Sugar 1 time daily: Dx:E11.27 100 each 3  . sacubitril-valsartan (ENTRESTO) 24-26 MG Take 1 tablet by mouth 2 (two) times daily. 60 tablet 11  . tamsulosin (FLOMAX) 0.4 MG CAPS capsule TAKE 1 CAPSULE EVERY DAY 90 capsule 1  . traMADol (ULTRAM) 50 MG tablet Take 1 tablet (50 mg total) by mouth every 6 (six) hours as needed for moderate pain. 30 tablet 0   No current facility-administered medications on file prior to visit.     No Known Allergies   Assessment/Plan:  1. HF medication optimization - BP soft but stable today at 106/58. Unable to titrate Entresto or start spironolactone. HR 61 limiting further titration of carvedilol. HF medications optimized given low BP. Will continue Entresto 24-26mg  BID and carvedilol 3.125mg  BID. Advised pt to monitor his BP at home and call clinic with any significant dizziness or SBP < 100. Will check BMET today with recent switch from lisinopril to [facility]. F/u in pharmacy clinic as needed.   Megan E. Supple, PharmD, BCACP, [place] [address] [place], [place] [address] Phone: [phone]; Fax: [phone] 02/19/2018 11:03 AM

## 2018-02-19 NOTE — Patient Instructions (Signed)
It was nice to meet you today  Continue taking your current medications  Call [facility] when you need a refill of your [facility]  Monitor your blood pressure at home - we would like your top (systolic) blood pressure to stay ~105 or above  Call clinic with any concerns

## 2018-02-26 ENCOUNTER — Telehealth: Payer: Self-pay

## 2018-02-26 DIAGNOSIS — I5022 Chronic systolic (congestive) heart failure: Secondary | ICD-10-CM

## 2018-02-26 NOTE — Telephone Encounter (Signed)
Per Dr. Lovena Le- have Pt repeat BMP in 4 weeks.  Left CVM for Pt requesting he call office and let us know what day (last 2 weeks of August) would be convenient for him to come for blood work.  Will enter order for labs.  Please schedule Pt for lab date when he returns call and link order.

## 2018-02-26 NOTE — Telephone Encounter (Signed)
-----   Message from Evans Lance, MD sent at 02/20/2018  9:12 PM EDT ----- Repeat bmp in 4 weeks.

## 2018-03-10 ENCOUNTER — Ambulatory Visit (INDEPENDENT_AMBULATORY_CARE_PROVIDER_SITE_OTHER): Payer: Medicare HMO

## 2018-03-10 DIAGNOSIS — Z95810 Presence of automatic (implantable) cardiac defibrillator: Secondary | ICD-10-CM

## 2018-03-10 DIAGNOSIS — I5022 Chronic systolic (congestive) heart failure: Secondary | ICD-10-CM | POA: Diagnosis not present

## 2018-03-11 ENCOUNTER — Other Ambulatory Visit: Payer: Self-pay | Admitting: Family Medicine

## 2018-03-11 NOTE — Progress Notes (Signed)
EPIC Encounter for ICM Monitoring  Patient Name: Randy [name] is a 72 y.o. male Date: 03/11/2018 Primary Care Physican: Lucille Passy, MD Primary Cardiologist: Lovena Le Electrophysiologist: Lovena Le Dry Weight: Previous weight 192 lbs baseline      Attempted call to patient and unable to reach.  Transmission reviewed.   Thoracic impedance normal.  Prescribed dosage: Furosemide 40 mg 1 tablet daily  Labs: 02/19/2018 Creatinine 2.24, BUN 42, Potassium 5.1, Sodium 139, EGFR 28-33 01/06/2018 Creatinine 2.09, BUN 30, Potassium 4.3, Sodium 142, EGFR 31-35  11/08/2017 Creatinine 1.70, BUN 32, Potassium 4.4, Sodium 143, EGFR 36-46  09/07/2017 Creatinine 1.79, BUN 38, Potassium 3.9, Sodium 134, EGFR 36-42  A complete set of results can be found in Results Review.  Recommendations: NONE - Unable to reach.  Follow-up plan: ICM clinic phone appointment on 04/10/2018.    Copy of ICM check sent to Dr. Lovena Le.   3 month ICM trend: 03/10/2018    1 Year ICM trend:       Rosalene Billings, RN 03/11/2018 1:56 PM

## 2018-03-12 ENCOUNTER — Telehealth: Payer: Self-pay

## 2018-03-12 NOTE — Telephone Encounter (Signed)
Remote ICM transmission received.  Attempted call to patient and no answer   

## 2018-03-19 ENCOUNTER — Other Ambulatory Visit: Payer: Medicare HMO | Admitting: *Deleted

## 2018-03-19 ENCOUNTER — Ambulatory Visit (INDEPENDENT_AMBULATORY_CARE_PROVIDER_SITE_OTHER): Payer: Medicare HMO | Admitting: *Deleted

## 2018-03-19 DIAGNOSIS — I255 Ischemic cardiomyopathy: Secondary | ICD-10-CM | POA: Diagnosis not present

## 2018-03-19 DIAGNOSIS — I5022 Chronic systolic (congestive) heart failure: Secondary | ICD-10-CM

## 2018-03-19 LAB — BASIC METABOLIC PANEL
BUN/Creatinine Ratio: 17 (ref 10–24)
BUN: 39 mg/dL — AB (ref 8–27)
CO2: 23 mmol/L (ref 20–29)
Calcium: 9.1 mg/dL (ref 8.6–10.2)
Chloride: 97 mmol/L (ref 96–106)
Creatinine, Ser: 2.35 mg/dL — ABNORMAL HIGH (ref 0.76–1.27)
GFR, EST AFRICAN AMERICAN: 31 mL/min/{1.73_m2} — AB (ref >59)
GFR, EST NON AFRICAN AMERICAN: 27 mL/min/{1.73_m2} — AB (ref >59)
GLUCOSE: 154 mg/dL — AB (ref 65–99)
Potassium: 4.8 mmol/L (ref 3.5–5.2)
Sodium: 138 mmol/L (ref 134–144)

## 2018-03-19 NOTE — Progress Notes (Signed)
Remote ICD transmission.   

## 2018-03-21 ENCOUNTER — Encounter: Payer: Self-pay | Admitting: Cardiology

## 2018-03-21 ENCOUNTER — Other Ambulatory Visit: Payer: Self-pay | Admitting: Internal Medicine

## 2018-03-24 ENCOUNTER — Other Ambulatory Visit: Payer: Self-pay | Admitting: Family Medicine

## 2018-03-26 ENCOUNTER — Telehealth: Payer: Self-pay

## 2018-03-26 DIAGNOSIS — N183 Chronic kidney disease, stage 3 unspecified: Secondary | ICD-10-CM

## 2018-03-26 DIAGNOSIS — E1121 Type 2 diabetes mellitus with diabetic nephropathy: Secondary | ICD-10-CM

## 2018-03-26 NOTE — Telephone Encounter (Signed)
-----   Message from Evans Lance, MD sent at 03/21/2018 10:50 PM EDT ----- No change in meds.

## 2018-03-26 NOTE — Telephone Encounter (Signed)
Discussed Pt lab results with Dr. Lovena Le.  Per Dr. Estanislado Pandy Pt is not seeing a kidney specialist refer Pt.  Call placed to Pt.   Advised of lab results.  Per Pt he is not seeing a kidney doctor.  Advised Pt this nurse would put in referral today.  Pt indicates understanding.

## 2018-04-04 DIAGNOSIS — N400 Enlarged prostate without lower urinary tract symptoms: Secondary | ICD-10-CM | POA: Diagnosis not present

## 2018-04-04 DIAGNOSIS — N184 Chronic kidney disease, stage 4 (severe): Secondary | ICD-10-CM | POA: Diagnosis not present

## 2018-04-04 DIAGNOSIS — I509 Heart failure, unspecified: Secondary | ICD-10-CM | POA: Diagnosis not present

## 2018-04-04 DIAGNOSIS — Z23 Encounter for immunization: Secondary | ICD-10-CM | POA: Diagnosis not present

## 2018-04-04 DIAGNOSIS — I129 Hypertensive chronic kidney disease with stage 1 through stage 4 chronic kidney disease, or unspecified chronic kidney disease: Secondary | ICD-10-CM | POA: Diagnosis not present

## 2018-04-04 DIAGNOSIS — M069 Rheumatoid arthritis, unspecified: Secondary | ICD-10-CM | POA: Diagnosis not present

## 2018-04-04 LAB — CBC AND DIFFERENTIAL
HCT: 35 — AB (ref 41–53)
Hemoglobin: 11.5 — AB (ref 13.5–17.5)
Neutrophils Absolute: 5
PLATELETS: 176 (ref 150–399)
WBC: 6.7

## 2018-04-04 LAB — BASIC METABOLIC PANEL
BUN: 50 — AB (ref 4–21)
Creatinine: 2.3 — AB (ref 0.6–1.3)
GLUCOSE: 118
Potassium: 5 (ref 3.4–5.3)
Sodium: 140 (ref 137–147)

## 2018-04-09 ENCOUNTER — Other Ambulatory Visit: Payer: Self-pay | Admitting: Nephrology

## 2018-04-09 ENCOUNTER — Telehealth: Payer: Self-pay | Admitting: Internal Medicine

## 2018-04-09 DIAGNOSIS — N184 Chronic kidney disease, stage 4 (severe): Secondary | ICD-10-CM

## 2018-04-09 DIAGNOSIS — R0602 Shortness of breath: Secondary | ICD-10-CM

## 2018-04-09 NOTE — Telephone Encounter (Signed)
Returned call to Pt.   Per Pt he thinks he may need a referral to a lung doctor d/t continued shortness of breath.    Per Dr. Lovena Le ok to refer to Dr. Lamonte Sakai for shortness of breath, discontinued amiodarone, abnormal PFT's.

## 2018-04-09 NOTE — Telephone Encounter (Signed)
New Message          Patient is calling to ask a question about the doctor that he is being  referral to  for his lungs.Pls call and advise.

## 2018-04-10 ENCOUNTER — Telehealth: Payer: Self-pay

## 2018-04-10 NOTE — Telephone Encounter (Signed)
Spoke with pt and reminded pt of remote transmission that is due today. Pt verbalized understanding.  Monthly check with Laurie. 

## 2018-04-14 ENCOUNTER — Ambulatory Visit (INDEPENDENT_AMBULATORY_CARE_PROVIDER_SITE_OTHER): Payer: Medicare HMO

## 2018-04-14 ENCOUNTER — Encounter: Payer: Self-pay | Admitting: Family Medicine

## 2018-04-14 DIAGNOSIS — Z95810 Presence of automatic (implantable) cardiac defibrillator: Secondary | ICD-10-CM | POA: Diagnosis not present

## 2018-04-14 DIAGNOSIS — I5022 Chronic systolic (congestive) heart failure: Secondary | ICD-10-CM

## 2018-04-14 NOTE — Progress Notes (Signed)
[facility]/thx dmf 

## 2018-04-15 NOTE — Progress Notes (Signed)
EPIC Encounter for ICM Monitoring  Patient Name: Randy [name] is a 72 y.o. male Date: 04/15/2018 Primary Care Physican: Lucille Passy, MD Primary Cardiologist:Taylor Electrophysiologist:Taylor Dry Weight:Previous weight 192lbs baseline          Heart Failure questions reviewed, pt asymptomatic.  Provided information on pulmonary referral from office note on 04/09/2018.  Dr Tanna Furry nurse attempted to reach patient with referral information.    Thoracic impedance normal.  Prescribed: Furosemide40 mg 1 tablet daily  Labs: 02/19/2018 Creatinine 2.24, BUN 42, Potassium 5.1, Sodium 139, EGFR 28-33 01/06/2018 Creatinine 2.09, BUN 30, Potassium 4.3, Sodium 142, EGFR 31-35  11/08/2017 Creatinine 1.70, BUN 32, Potassium 4.4, Sodium 143, EGFR 36-46  09/07/2017 Creatinine 1.79, BUN 38, Potassium 3.9, Sodium 134, EGFR 36-42  A complete set of results can be found in Results Review.  Recommendations: No changes.   Encouraged to call for fluid symptoms.  Follow-up plan: ICM clinic phone appointment on 06/09/2018.   Office appointment scheduled 05/07/2018 with Dr. Lovena Le.    Copy of ICM check sent to Dr. Lovena Le.   3 month ICM trend: 04/12/2018    1 Year ICM trend:       Rosalene Billings, RN 04/15/2018 5:17 PM

## 2018-04-24 ENCOUNTER — Ambulatory Visit: Payer: Medicare HMO | Admitting: Emergency Medicine

## 2018-04-24 ENCOUNTER — Ambulatory Visit (INDEPENDENT_AMBULATORY_CARE_PROVIDER_SITE_OTHER)
Admission: RE | Admit: 2018-04-24 | Discharge: 2018-04-24 | Disposition: A | Discharge status: Home / Self Care | Payer: Medicare HMO | Source: Ambulatory Visit | Attending: Emergency Medicine | Admitting: Emergency Medicine

## 2018-04-24 ENCOUNTER — Encounter: Payer: Self-pay | Admitting: Emergency Medicine

## 2018-04-24 DIAGNOSIS — J301 Allergic rhinitis due to pollen: Secondary | ICD-10-CM | POA: Diagnosis not present

## 2018-04-24 DIAGNOSIS — R0609 Other forms of dyspnea: Secondary | ICD-10-CM

## 2018-04-24 DIAGNOSIS — R0602 Shortness of breath: Secondary | ICD-10-CM | POA: Diagnosis not present

## 2018-04-24 DIAGNOSIS — J309 Allergic rhinitis, unspecified: Secondary | ICD-10-CM | POA: Insufficient documentation

## 2018-04-24 MED ORDER — TIOTROPIUM BROMIDE-OLODATEROL 2.5-2.5 MCG/ACT IN AERS: 2.0000 | INHALATION_SPRAY | Freq: Every day | RESPIRATORY_TRACT | 0 refills | Status: DC

## 2018-04-24 NOTE — Assessment & Plan Note (Signed)
Having drainage.  Some associated cough.  We will discuss possibly starting an antihistamine next time.

## 2018-04-24 NOTE — Patient Instructions (Signed)
Chest x-ray today We will start Stiolto 2 puffs once daily to see if this benefits her breathing.  If so then we will probably order through your pharmacy. Follow with Dr Lamonte Sakai in 1 month or next available to review your progress.

## 2018-04-24 NOTE — Assessment & Plan Note (Addendum)
May be multifactorial since he does have significant coronary disease but I do suspect that a large component of this is obstructive lung disease as documented on his pulmonary function testing from earlier this year.  He is never been on a bronchodilator and I think a trial to see if he benefits is appropriate.  I will start [place].  Need to sort out whether he is on amiodarone. His chest x-ray from March was reassuring.  I will repeat one today to compare.  If suspicion for amiodarone toxicity increases then we could consider CT chest as well.  Chest x-ray today We will start Stiolto 2 puffs once daily to see if this benefits her breathing.  If so then we will probably order through your pharmacy. Follow with Dr Lamonte Sakai in 1 month or next available to review your progress.

## 2018-04-24 NOTE — Progress Notes (Signed)
Subjective:    Patient ID: Randy [name], male    DOB: 1946/05/18, 72 y.o.   MRN: [id number]  HPI 72 year old former smoker (34 pack years) with a history of hypertension, diabetes, coronary disease/CABG, ischemic cardiomyopathy with an EF of 35 to 40% and AICD in place.  He has a history of VT. amiodarone remains on his med list but I see from cardiology notes that it was to be stopped.  He may have a history of sleep apnea, reportedly had a partial sleep study and is never been on CPAP.  He is referred today for progressive dyspnea. He had CABG in 08/2017, and he believes that it has worsened since that procedure. He notices it with bending over, can walk 100 ft and then has to rest. Can shop with a cart. No wheeze, no cough on exertion. Does not happen at rest. He has had some cough with allergy sx for last 2 years.   He is unsure as to whether he is actually taking amiodarone or not.  It is on his medicine list but Dr. Tanna Furry notes indicate that they instructed him to stop it   PFT 01/09/2018 reviewed by me > severe obstruction with a positive bronchodilator response.  Lung volumes are hyperinflated.  He has a decreased diffusion capacity that corrects to the normal range when adjusted for his alveolar volume  CXR 10/21/17 >> no evidence ILD or amiodarone change.    Review of Systems  Constitutional: Negative for fever and unexpected weight change.  HENT: Negative for congestion, dental problem, ear pain, nosebleeds, postnasal drip, rhinorrhea, sinus pressure, sneezing, sore throat and trouble swallowing.   Eyes: Negative for redness and itching.  Respiratory: Positive for cough and shortness of breath. Negative for chest tightness and wheezing.   Cardiovascular: Negative for palpitations and leg swelling.  Gastrointestinal: Negative for nausea and vomiting.  Genitourinary: Negative for dysuria.  Musculoskeletal: Negative for joint swelling.  Skin: Negative for rash.  Neurological:  Negative for headaches.  Hematological: Does not bruise/bleed easily.  Psychiatric/Behavioral: Negative for dysphoric mood. The patient is not nervous/anxious.    Past Medical History:  Diagnosis Date  . AICD (automatic cardioverter/defibrillator) present 10/18/2016   "replaced 2009-St. Jude device, Dr Lovena Le"  . Anemia   . Arthritis    "back" (08/27/2017)  . CAD (coronary artery disease)   . CHF (congestive heart failure) ([place])   . CKD (chronic kidney disease) stage 3, GFR 30-59 ml/min (HCC)    [name]  . Diverticulosis   . Dyslipidemia   . GERD (gastroesophageal reflux disease)   . Hypertension   . Neuromuscular disorder (HCC)    neuropathy- in hands  . Osteoarthritis    hips, hands  . Pacemaker   . Shortness of breath   . Sleep apnea    started a sleep study but didn't completed, states PCP told him he has sleep  apnea, doesn't use CPAP  . Systolic dysfunction   . Type 2 diabetes with nephropathy ([place])   . Vitamin D deficiency      Family History  Problem Relation Age of Onset  . Heart attack Father 73  . Diabetes Brother      Social History   Socioeconomic History  . Marital status: Divorced    Spouse name: Not on file  . Number of children: 2  . Years of education: Not on file  . Highest education level: Not on file  Occupational History  . 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 Financial resource strain: Not on file  . Food insecurity:    Worry: Not on file    Inability: Not on file  . Transportation needs:    Medical: Not on file    Non-medical: Not on file  Tobacco Use  . Smoking status: Former Smoker    Packs/day: 2.00    Years: 17.00    Pack years: 34.00    Types: Cigarettes    Last attempt to quit: 07/30/1984    Years since quitting: 33.7  . Smokeless tobacco: Never Used  Substance and Sexual Activity  . Alcohol use: Yes    Comment: 08/27/2017 "probably have had 6 beers in the last 6 months;  drink when I play golf"  . Drug use: No  . Sexual activity: Never  Lifestyle  . Physical activity:    Days per week: Not on file    Minutes per session: Not on file  . Stress: Not on file  Relationships  . Social connections:    Talks on phone: Not on file    Gets together: Not on file    Attends religious service: Not on file    Active member of club or organization: Not on file    Attends meetings of clubs or organizations: Not on file    Relationship status: Not on file  . Intimate partner violence:    Fear of current or ex partner: Not on file    Emotionally abused: Not on file    Physically abused: Not on file    Forced sexual activity: Not on file  Other Topics Concern  . Not on file  Social History Narrative   Does not have a living will.   Desires CPR, does want life support if not futile.     has worked in Armed forces logistics/support/administrative officer, suspected asbestos exposure but not definite Has lived in [place] No [employer]   No Known Allergies   Outpatient Medications Prior to Visit  Medication Sig Dispense Refill  . ACCU-CHEK SOFTCLIX LANCETS lancets Use as instructed to check blood sugar once daily and as needed.  Diagnosis:  E11.21  Non- insulin dependent. 100 each 3  . acetaminophen (TYLENOL) 325 MG tablet Take 2 tablets (650 mg total) by mouth every 6 (six) hours as needed for mild pain.    Marland Kitchen albuterol (PROVENTIL HFA;VENTOLIN HFA) 108 (90 Base) MCG/ACT inhaler Inhale 2 puffs into the lungs every 6 (six) hours as needed for wheezing or shortness of breath. 1 Inhaler 0  . Alcohol Swabs (B-D SINGLE USE SWABS REGULAR) PADS Use as instructed to clean area for glucose monitoring once daily and as needed.  Diagnosis:  E11.21  Non insulin-dependent 100 each 3  . amiodarone (PACERONE) 200 MG tablet Take 1 tablet (200 mg total) by mouth daily. 90 tablet 1  . aspirin EC 325 MG tablet Take 1 tablet (325 mg total) by mouth daily.    Marland Kitchen atorvastatin (LIPITOR) 80 MG tablet Take 1 tablet (80 mg total) by  mouth daily at 6 PM. 90 tablet 2  . Blood Glucose Calibration (ACCU-CHEK AVIVA) SOLN Use to calibrate blood glucose machine as recommended.  Diagnosis:  E11.21  Non-insulin dependent. 1 each 2  . Blood Glucose Monitoring Suppl (ACCU-CHEK AVIVA PLUS) w/Device KIT USE TO CHECK BLOOD SUGAR ONCE DAILY.  1 kit 0  . ENTRESTO 24-26 MG TAKE 1 TABLET BY MOUTH TWICE A DAY 90 tablet 3  . finasteride (  PROSCAR) 5 MG tablet Take 1 tablet (5 mg total) by mouth daily. 90 tablet 2  . gabapentin (NEURONTIN) 300 MG capsule Take 2 capsules (600 mg total) by mouth 2 (two) times daily. 360 capsule 2  . glipiZIDE (GLUCOTROL) 5 MG tablet TAKE 1 TABLET EVERY DAY BEFORE BREAKFAST 90 tablet 0  . glucose blood (ACCU-CHEK AVIVA PLUS) test strip Test Blood Sugar 1 time daily: Dx:E11.27 100 each 3  . sacubitril-valsartan (ENTRESTO) 24-26 MG Take 1 tablet by mouth 2 (two) times daily. 60 tablet 11  . tamsulosin (FLOMAX) 0.4 MG CAPS capsule TAKE 1 CAPSULE EVERY DAY 90 capsule 1  . traMADol (ULTRAM) 50 MG tablet Take 1 tablet (50 mg total) by mouth every 6 (six) hours as needed for moderate pain. 30 tablet 0  . carvedilol (COREG) 3.125 MG tablet Take 1 tablet (3.125 mg total) by mouth 2 (two) times daily. 60 tablet 11  . furosemide (LASIX) 40 MG tablet Take 1 tablet (40 mg total) by mouth daily. 90 tablet 3   No facility-administered medications prior to visit.         Objective:   Physical Exam Vitals:   04/24/18 0912  BP: 92/60  Pulse: 68  SpO2: 95%  Weight: 198 lb (89.8 kg)  Height: '5\' 7"'  (1.702 m)   Gen: Pleasant, well-nourished, in no distress,  normal affect  ENT: No lesions,  mouth clear,  oropharynx clear, upper dentures, no postnasal drip  Neck: No JVD, no stridor  Lungs: No use of accessory muscles, somewhat small breaths, no wheeze or crackles.   Cardiovascular: RRR, heart sounds normal, no murmur or gallops, no peripheral edema  Musculoskeletal: he has some joint deformities on fingers, esp B index  fingers, no cyanosis or clubbing   Neuro: alert, non focal  Skin: Warm, no lesions or rash     Assessment & Plan:  DOE (dyspnea on exertion) May be multifactorial since he does have significant coronary disease but I do suspect that a large component of this is obstructive lung disease as documented on his pulmonary function testing from earlier this year.  He is never been on a bronchodilator and I think a trial to see if he benefits is appropriate.  I will start [place].  Need to sort out whether he is on amiodarone. His chest x-ray from March was reassuring.  I will repeat one today to compare.  If suspicion for amiodarone toxicity increases then we could consider CT chest as well.  Chest x-ray today We will start Stiolto 2 puffs once daily to see if this benefits her breathing.  If so then we will probably order through your pharmacy. Follow with Dr Lamonte Sakai in 1 month or next available to review your progress.  Allergic rhinitis Having drainage.  Some associated cough.  We will discuss possibly starting an antihistamine next time.  Baltazar Apo, MD, PhD 04/24/2018, 9:59 AM [facility] [phone] or if no answer [phone]

## 2018-04-24 NOTE — Progress Notes (Signed)
Patient seen in the office today and instructed on use of Stiolto Respimat.  Patient expressed understanding and demonstrated technique.  

## 2018-04-25 ENCOUNTER — Ambulatory Visit
Admission: RE | Admit: 2018-04-25 | Discharge: 2018-04-25 | Disposition: A | Discharge status: Home / Self Care | Payer: Medicare HMO | Source: Ambulatory Visit | Attending: Nephrology | Admitting: Nephrology

## 2018-04-25 DIAGNOSIS — N184 Chronic kidney disease, stage 4 (severe): Secondary | ICD-10-CM

## 2018-04-25 LAB — CUP PACEART REMOTE DEVICE CHECK
Battery Remaining Longevity: 82 mo
Battery Remaining Percentage: 83 %
Brady Statistic AS VP Percent: 1 %
Brady Statistic AS VS Percent: 97 %
Date Time Interrogation Session: 20190821064956
HIGH POWER IMPEDANCE MEASURED VALUE: 71 Ohm
HIGH POWER IMPEDANCE MEASURED VALUE: 71 Ohm
Implantable Lead Implant Date: 20091022
Implantable Lead Implant Date: 20180322
Implantable Lead Location: 753859
Implantable Lead Location: 753860
Implantable Pulse Generator Implant Date: 20180322
Lead Channel Impedance Value: 950 Ohm
Lead Channel Pacing Threshold Amplitude: 0.75 V
Lead Channel Pacing Threshold Pulse Width: 0.5 ms
Lead Channel Pacing Threshold Pulse Width: 0.6 ms
Lead Channel Sensing Intrinsic Amplitude: 10.6 mV
Lead Channel Setting Pacing Amplitude: 2 V
Lead Channel Setting Pacing Amplitude: 2.5 V
MDC IDC MSMT BATTERY VOLTAGE: 2.99 V
MDC IDC MSMT LEADCHNL RA IMPEDANCE VALUE: 310 Ohm
MDC IDC MSMT LEADCHNL RA SENSING INTR AMPL: 1.2 mV
MDC IDC MSMT LEADCHNL RV PACING THRESHOLD AMPLITUDE: 1.25 V
MDC IDC SET LEADCHNL RV PACING PULSEWIDTH: 0.6 ms
MDC IDC SET LEADCHNL RV SENSING SENSITIVITY: 0.5 mV
MDC IDC STAT BRADY AP VP PERCENT: 1 %
MDC IDC STAT BRADY AP VS PERCENT: 2.2 %
MDC IDC STAT BRADY RA PERCENT PACED: 1.7 %
MDC IDC STAT BRADY RV PERCENT PACED: 1 %
Pulse Gen Serial Number: 7413245

## 2018-05-06 ENCOUNTER — Telehealth: Payer: Self-pay

## 2018-05-06 ENCOUNTER — Other Ambulatory Visit: Payer: Self-pay | Admitting: Family Medicine

## 2018-05-06 NOTE — Telephone Encounter (Signed)
Asked Pt to bring all his medications with him to appt tomorrow to make sure med list is correct.  Pt indicates understanding.

## 2018-05-07 ENCOUNTER — Encounter: Payer: Self-pay | Admitting: Internal Medicine

## 2018-05-07 ENCOUNTER — Ambulatory Visit: Payer: Medicare HMO | Admitting: Internal Medicine

## 2018-05-07 VITALS — BP 102/60 | HR 60 | Ht 67.0 in | Wt 197.4 lb

## 2018-05-07 DIAGNOSIS — I10 Essential (primary) hypertension: Secondary | ICD-10-CM | POA: Diagnosis not present

## 2018-05-07 DIAGNOSIS — I472 Ventricular tachycardia: Secondary | ICD-10-CM | POA: Diagnosis not present

## 2018-05-07 DIAGNOSIS — R0602 Shortness of breath: Secondary | ICD-10-CM

## 2018-05-07 DIAGNOSIS — I5022 Chronic systolic (congestive) heart failure: Secondary | ICD-10-CM

## 2018-05-07 DIAGNOSIS — Z95810 Presence of automatic (implantable) cardiac defibrillator: Secondary | ICD-10-CM | POA: Diagnosis not present

## 2018-05-07 NOTE — Patient Instructions (Addendum)
Medication Instructions:  Your physician recommends that you continue on your current medications as directed. Please refer to the Current Medication list given to you today.  Labwork: None ordered.  Testing/Procedures: None ordered.  Follow-Up: Your physician wants you to follow-up in: 6 months with Dr. Lovena Le.   You will receive a reminder letter in the mail two months in advance. If you don't receive a letter, please call our office to schedule the follow-up appointment.  Remote monitoring is used to monitor your ICD from home. This monitoring reduces the number of office visits required to check your device to one time per year. It allows [place] to keep an eye on the functioning of your device to ensure it is working properly. You are scheduled for a device check from home on 06/09/2018. You may send your transmission at any time that day. If you have a wireless device, the transmission will be sent automatically. After your physician reviews your transmission, you will receive a postcard with your next transmission date.  Any Other Special Instructions Will Be Listed Below (If Applicable).  If you need a refill on your cardiac medications before your next appointment, please call your pharmacy.

## 2018-05-07 NOTE — Progress Notes (Signed)
HPI Randy [name] returns today for followup. He is a pleasant 73yo man with chronic systolic heart failure, CAD, s/p CABG and VT. We had him stop his amio when I saw him last although PFT's and ESR were not particularly concerning for amio lung toxicity. He is a little better but still has dyspnea and bendopnea.He recently saw Dr. Lamonte Sakai and was placed on bronchodilators. He is about the same. No ICD therapies. No Known Allergies   Current Outpatient Medications  Medication Sig Dispense Refill  . ACCU-CHEK SOFTCLIX LANCETS lancets Use as instructed to check blood sugar once daily and as needed.  Diagnosis:  E11.21  Non- insulin dependent. 100 each 3  . Alcohol Swabs (B-D SINGLE USE SWABS REGULAR) PADS Use as instructed to clean area for glucose monitoring once daily and as needed.  Diagnosis:  E11.21  Non insulin-dependent 100 each 3  . aspirin EC 325 MG tablet Take 1 tablet (325 mg total) by mouth daily.    Marland Kitchen atorvastatin (LIPITOR) 80 MG tablet Take 1 tablet (80 mg total) by mouth daily at 6 PM. 90 tablet 2  . Blood Glucose Calibration (ACCU-CHEK AVIVA) SOLN Use to calibrate blood glucose machine as recommended.  Diagnosis:  E11.21  Non-insulin dependent. 1 each 2  . Blood Glucose Monitoring Suppl (ACCU-CHEK AVIVA PLUS) w/Device KIT USE TO CHECK BLOOD SUGAR ONCE DAILY.  1 kit 0  . carvedilol (COREG) 3.125 MG tablet Take 1 tablet (3.125 mg total) by mouth 2 (two) times daily. 60 tablet 11  . finasteride (PROSCAR) 5 MG tablet Take 1 tablet (5 mg total) by mouth daily. 90 tablet 2  . furosemide (LASIX) 40 MG tablet Take 1 tablet (40 mg total) by mouth daily. 90 tablet 3  . gabapentin (NEURONTIN) 300 MG capsule Take 2 capsules (600 mg total) by mouth 2 (two) times daily. 360 capsule 2  . glipiZIDE (GLUCOTROL) 5 MG tablet TAKE 1 TABLET EVERY DAY BEFORE BREAKFAST 90 tablet 0  . glucose blood (ACCU-CHEK AVIVA PLUS) test strip Test Blood Sugar 1 time daily: Dx:E11.27 100 each 3  .  sacubitril-valsartan (ENTRESTO) 24-26 MG Take 1 tablet by mouth 2 (two) times daily. 60 tablet 11  . tamsulosin (FLOMAX) 0.4 MG CAPS capsule TAKE 1 CAPSULE EVERY DAY 90 capsule 1  . traMADol (ULTRAM) 50 MG tablet Take 1 tablet (50 mg total) by mouth every 6 (six) hours as needed for moderate pain. 30 tablet 0   No current facility-administered medications for this visit.      Past Medical History:  Diagnosis Date  . AICD (automatic cardioverter/defibrillator) present 10/18/2016   "replaced 2009-St. Jude device, Dr Lovena Le"  . Anemia   . Arthritis    "back" (08/27/2017)  . CAD (coronary artery disease)   . CHF (congestive heart failure) ([place])   . CKD (chronic kidney disease) stage 3, GFR 30-59 ml/min (HCC)    [name]  . Diverticulosis   . Dyslipidemia   . GERD (gastroesophageal reflux disease)   . Hypertension   . Neuromuscular disorder (HCC)    neuropathy- in hands  . Osteoarthritis    hips, hands  . Pacemaker   . Shortness of breath   . Sleep apnea    started a sleep study but didn't completed, states PCP told him he has sleep  apnea, doesn't use CPAP  . Systolic dysfunction   . Type 2 diabetes with nephropathy ([place])   . Vitamin D deficiency     ROS:  All systems reviewed and negative except as noted in the HPI.   Past Surgical History:  Procedure Laterality Date  . ANTERIOR CERVICAL DECOMP/DISCECTOMY FUSION    . BACK SURGERY    . CARPAL TUNNEL RELEASE Left   . CORONARY ARTERY BYPASS GRAFT N/A 09/02/2017   Procedure: CORONARY ARTERY BYPASS GRAFTING (CABG) x three , using left internal mammary artery and right leg greater saphenous vein harvested endoscopically;  Surgeon: [name], MD;  Location: [facility];  Service: Open Heart Surgery;  Laterality: N/A;  . CYST EXCISION     "back"  . ICD IMPLANT N/A 10/18/2016   Procedure: ICD Implant- Upgrade to Dual Chamber;  Surgeon: Evans Lance, MD;  Location: [facility];  Service: Cardiovascular;  Laterality: N/A;    . INSERT / REPLACE / REMOVE PACEMAKER    . JOINT REPLACEMENT    . LEFT HEART CATH AND CORONARY ANGIOGRAPHY N/A 08/30/2017   Procedure: LEFT HEART CATH AND CORONARY ANGIOGRAPHY;  Surgeon: Martinique, Peter M, MD;  Location: [facility];  Service: Cardiovascular;  Laterality: N/A;  . LUMBAR LAMINECTOMY Left 07/17/2013   Procedure: MICRODISCECTOMY LUMBAR LAMINECTOMY;  Surgeon: Marybelle Killings, MD;  Location: [facility];  Service: Orthopedics;  Laterality: Left;  Left L4 Laminotomy, Lateral Recess Decompression, Cyst Excision  . SHOULDER OPEN ROTATOR CUFF REPAIR Left   . TEE WITHOUT CARDIOVERSION N/A 09/02/2017   Procedure: TRANSESOPHAGEAL ECHOCARDIOGRAM (TEE);  Surgeon: [name], MD;  Location: [facility];  Service: Open Heart Surgery;  Laterality: N/A;  . TOTAL HIP ARTHROPLASTY Left 09/17/2012   Procedure: Left TOTAL HIP ARTHROPLASTY ANTERIOR APPROACH;  Surgeon: Marybelle Killings, MD;  Location: [facility];  Service: Orthopedics;  Laterality: Left;     Family History  Problem Relation Age of Onset  . Heart attack Father 77  . Diabetes Brother      Social History   Socioeconomic History  . Marital status: Divorced    Spouse name: Not on file  . Number of children: 2  . Years of education: Not on file  . Highest education level: Not on file  Occupational History  . 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 Financial resource strain: Not on file  . Food insecurity:    Worry: Not on file    Inability: Not on file  . Transportation needs:    Medical: Not on file    Non-medical: Not on file  Tobacco Use  . Smoking status: Former Smoker    Packs/day: 2.00    Years: 17.00    Pack years: 34.00    Types: Cigarettes    Last attempt to quit: 07/30/1984    Years since quitting: 33.7  . Smokeless tobacco: Never Used  Substance and Sexual Activity  . Alcohol use: Yes    Comment: 08/27/2017 "probably have had 6 beers in the last 6 months; drink when I play  golf"  . Drug use: No  . Sexual activity: Never  Lifestyle  . Physical activity:    Days per week: Not on file    Minutes per session: Not on file  . Stress: Not on file  Relationships  . Social connections:    Talks on phone: Not on file    Gets together: Not on file    Attends religious service: Not on file    Active member of club or organization: Not on file    Attends  meetings of clubs or organizations: Not on file    Relationship status: Not on file  . Intimate partner violence:    Fear of current or ex partner: Not on file    Emotionally abused: Not on file    Physically abused: Not on file    Forced sexual activity: Not on file  Other Topics Concern  . Not on file  Social History Narrative   Does not have a living will.   Desires CPR, does want life support if not futile.        BP 102/60   Pulse 60   Ht '5\' 7"'  (1.702 m)   Wt 197 lb 6.4 oz (89.5 kg)   SpO2 97%   BMI 30.92 kg/m   Physical Exam:  Well appearing NAD HEENT: Unremarkable Neck:  No JVD, no thyromegally Lymphatics:  No adenopathy Back:  No CVA tenderness Lungs:  Clear with no wheezes HEART:  Regular rate rhythm, no murmurs, no rubs, no clicks Abd:  soft, positive bowel sounds, no organomegally, no rebound, no guarding Ext:  2 plus pulses, no edema, no cyanosis, no clubbing Skin:  No rashes no nodules Neuro:  CN II through XII intact, motor grossly intact  EKG - NSR with non-specific T wave abnormality.  DEVICE  Normal device function.  See PaceArt for details.   Assess/Plan: 1. Dyspnea - he is improved but minimally. He will continue his current meds including his bronchodilators. 2. ICD - his St. Jude device is working normally. 3. Chronic systolic heart failure - he will continue his current meds. He has switched to [facility] 4. VT - he has had no additional therapies. He will undergo watchful waiting.  Mikle Bosworth.D.

## 2018-05-14 ENCOUNTER — Other Ambulatory Visit: Payer: Self-pay | Admitting: Family Medicine

## 2018-05-19 ENCOUNTER — Other Ambulatory Visit: Payer: Self-pay | Admitting: Internal Medicine

## 2018-05-19 MED ORDER — CARVEDILOL 3.125 MG PO TABS: 3.1250 mg | ORAL_TABLET | Freq: Two times a day (BID) | ORAL | 3 refills | Status: DC

## 2018-05-19 MED ORDER — FUROSEMIDE 40 MG PO TABS: 40.0000 mg | ORAL_TABLET | Freq: Every day | ORAL | 3 refills | Status: DC

## 2018-05-19 NOTE — Telephone Encounter (Signed)
° ° °*  STAT* If patient is at the pharmacy, call can be transferred to refill team.   1. Which medications need to be refilled? (please list name of each medication and dose if known) carvedilol (COREG) 3.125 MG tablet, furosemide (LASIX) 40 MG tablet   2. Which pharmacy/location (including street and city if local pharmacy) is medication to be sent to? [place], [place], [phone]  3. Do they need a 30 day or 90 day supply?[place]

## 2018-05-19 NOTE — Telephone Encounter (Signed)
Pt's medication was sent to pt's pharmacy as requested. Confirmation received.  °

## 2018-05-20 LAB — CUP PACEART INCLINIC DEVICE CHECK
Brady Statistic RA Percent Paced: 1.1 %
Brady Statistic RV Percent Paced: 0.26 %
Date Time Interrogation Session: 20191009125623
HIGH POWER IMPEDANCE MEASURED VALUE: 66.375
Implantable Lead Implant Date: 20091022
Implantable Lead Implant Date: 20180322
Implantable Lead Location: 753859
Lead Channel Impedance Value: 312.5 Ohm
Lead Channel Impedance Value: 975 Ohm
Lead Channel Pacing Threshold Pulse Width: 0.5 ms
Lead Channel Pacing Threshold Pulse Width: 0.6 ms
Lead Channel Sensing Intrinsic Amplitude: 1.2 mV
MDC IDC LEAD LOCATION: 753860
MDC IDC MSMT BATTERY REMAINING LONGEVITY: 87 mo
MDC IDC MSMT LEADCHNL RA PACING THRESHOLD AMPLITUDE: 0.75 V
MDC IDC MSMT LEADCHNL RV PACING THRESHOLD AMPLITUDE: 1.25 V
MDC IDC MSMT LEADCHNL RV SENSING INTR AMPL: 12 mV
MDC IDC PG IMPLANT DT: 20180322
MDC IDC SET LEADCHNL RA PACING AMPLITUDE: 2 V
MDC IDC SET LEADCHNL RV PACING AMPLITUDE: 2.5 V
MDC IDC SET LEADCHNL RV PACING PULSEWIDTH: 0.6 ms
MDC IDC SET LEADCHNL RV SENSING SENSITIVITY: 0.5 mV
Pulse Gen Serial Number: 7413245

## 2018-05-26 ENCOUNTER — Telehealth: Payer: Self-pay

## 2018-05-26 NOTE — Telephone Encounter (Signed)
Patient calling because he is trying to reach Dr Forde Dandy nurse, Myrtie Hawk.  He said he needs to speak with her regarding some of his medications.  Advised I would send her a message to have her call him.  He asked for the office number but had to hang up before I was able to provide the information.

## 2018-05-26 NOTE — Telephone Encounter (Signed)
Left message to call back  

## 2018-05-27 ENCOUNTER — Other Ambulatory Visit: Payer: Self-pay | Admitting: Family Medicine

## 2018-05-27 ENCOUNTER — Ambulatory Visit: Payer: Medicare HMO | Admitting: Emergency Medicine

## 2018-05-27 ENCOUNTER — Encounter: Payer: Self-pay | Admitting: Emergency Medicine

## 2018-05-27 DIAGNOSIS — J449 Chronic obstructive pulmonary disease, unspecified: Secondary | ICD-10-CM | POA: Diagnosis not present

## 2018-05-27 DIAGNOSIS — J301 Allergic rhinitis due to pollen: Secondary | ICD-10-CM | POA: Diagnosis not present

## 2018-05-27 NOTE — Assessment & Plan Note (Signed)
Seem to have benefited some degree from the [employer].  He lost his wife since our last visit which makes the interpretation a bit difficult for him as he has been going through a lot of stress and grief.  At this point based on his perceived benefit I think is reasonable to continue it for another 3 months and determine whether to continue long-term.  We could consider stopping at that point, see if he misses it.  Also we can consider initiating an alternative to see if he gets more benefit.  Flu shot is up-to-date

## 2018-05-27 NOTE — Patient Instructions (Signed)
We will continue Stiolto 2 puffs once daily until your next visit.  Keep track of with you feel like you are benefiting from this medication. Flu shot is up-to-date Your chest x-ray performed last visit was normal. Follow with Dr Lamonte Sakai in 3 months or sooner if you have any problems.

## 2018-05-27 NOTE — Progress Notes (Signed)
Subjective:    Patient ID: Randy [name], male    DOB: [birth year], 72 y.o.   MRN: [id number]  HPI 72 year old former smoker (34 pack years) with a history of hypertension, diabetes, coronary disease/CABG, ischemic cardiomyopathy with an EF of 35 to 40% and AICD in place.  He has a history of VT. amiodarone remains on his med list but I see from cardiology notes that it was to be stopped.  He may have a history of sleep apnea, reportedly had a partial sleep study and is never been on CPAP.  He is referred today for progressive dyspnea. He had CABG in 08/2017, and he believes that it has worsened since that procedure. He notices it with bending over, can walk 100 ft and then has to rest. Can shop with a cart. No wheeze, no cough on exertion. Does not happen at rest. He has had some cough with allergy sx for last 2 years.   He is unsure as to whether he is actually taking amiodarone or not.  It is on his medicine list but Dr. Tanna Furry notes indicate that they instructed him to stop it   PFT 01/09/2018 reviewed by me > severe obstruction with a positive bronchodilator response.  Lung volumes are hyperinflated.  He has a decreased diffusion capacity that corrects to the normal range when adjusted for his alveolar volume  CXR 10/21/17 >> no evidence ILD or amiodarone change.   ROV 05/27/18 --Mr. [name] follows up today for exertional dyspnea.  He has a history of tobacco use with severe obstruction and a positive bronchodilator response by spirometry.  He also has a history of coronary disease, ischemic cardiomyopathy, VT (AICD).  He has been on amiodarone in the past, was off of it at our initial visit.  Chest x-ray 9/26 was clear.  I started him on Stiolto to see if he would get benefit. He isn' t sure that he has benefited much from the [place], maybe a small amount. He isn't really very active. He is having some hoarse voice for over a week, not clearly related to the inhaler since he's been on it for  over a month.  He has had his flu shot.   Review of Systems  Constitutional: Negative for fever and unexpected weight change.  HENT: Negative for congestion, dental problem, ear pain, nosebleeds, postnasal drip, rhinorrhea, sinus pressure, sneezing, sore throat and trouble swallowing.   Eyes: Negative for redness and itching.  Respiratory: Positive for cough and shortness of breath. Negative for chest tightness and wheezing.   Cardiovascular: Negative for palpitations and leg swelling.  Gastrointestinal: Negative for nausea and vomiting.  Genitourinary: Negative for dysuria.  Musculoskeletal: Negative for joint swelling.  Skin: Negative for rash.  Neurological: Negative for headaches.  Hematological: Does not bruise/bleed easily.  Psychiatric/Behavioral: Negative for dysphoric mood. The patient is not nervous/anxious.    Past Medical History:  Diagnosis Date  . AICD (automatic cardioverter/defibrillator) present 10/18/2016   "replaced 2009-St. Jude device, Dr Lovena Le"  . Anemia   . Arthritis    "back" (08/27/2017)  . CAD (coronary artery disease)   . CHF (congestive heart failure) ([place])   . CKD (chronic kidney disease) stage 3, GFR 30-59 ml/min (HCC)    [name]  . Diverticulosis   . Dyslipidemia   . GERD (gastroesophageal reflux disease)   . Hypertension   . Neuromuscular disorder (HCC)    neuropathy- in hands  . Osteoarthritis    hips, hands  . Pacemaker   .  Shortness of breath   . Sleep apnea    started a sleep study but didn't completed, states PCP told him he has sleep  apnea, doesn't use CPAP  . Systolic dysfunction   . Type 2 diabetes with nephropathy ([place])   . Vitamin D deficiency      Family History  Problem Relation Age of Onset  . Heart attack Father 19  . Diabetes Brother      Social History   Socioeconomic History  . Marital status: Divorced    Spouse name: Not on file  . Number of children: 2  . Years of education: Not on file  . Highest education  level: Not on file  Occupational History  . 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 Financial resource strain: Not on file  . Food insecurity:    Worry: Not on file    Inability: Not on file  . Transportation needs:    Medical: Not on file    Non-medical: Not on file  Tobacco Use  . Smoking status: Former Smoker    Packs/day: 2.00    Years: 17.00    Pack years: 34.00    Types: Cigarettes    Last attempt to quit: 07/30/1984    Years since quitting: 33.8  . Smokeless tobacco: Never Used  Substance and Sexual Activity  . Alcohol use: Yes    Comment: 08/27/2017 "probably have had 6 beers in the last 6 months; drink when I play golf"  . Drug use: No  . Sexual activity: Never  Lifestyle  . Physical activity:    Days per week: Not on file    Minutes per session: Not on file  . Stress: Not on file  Relationships  . Social connections:    Talks on phone: Not on file    Gets together: Not on file    Attends religious service: Not on file    Active member of club or organization: Not on file    Attends meetings of clubs or organizations: Not on file    Relationship status: Not on file  . Intimate partner violence:    Fear of current or ex partner: Not on file    Emotionally abused: Not on file    Physically abused: Not on file    Forced sexual activity: Not on file  Other Topics Concern  . Not on file  Social History Narrative   Does not have a living will.   Desires CPR, does want life support if not futile.     has worked in Armed forces logistics/support/administrative officer, suspected asbestos exposure but not definite Has lived in [place] No [employer]   No Known Allergies   Outpatient Medications Prior to Visit  Medication Sig Dispense Refill  . ACCU-CHEK SOFTCLIX LANCETS lancets Use as instructed to check blood sugar once daily and as needed.  Diagnosis:  E11.21  Non- insulin dependent. 100 each 3  . Alcohol Swabs (B-D SINGLE USE SWABS REGULAR) PADS Use as  instructed to clean area for glucose monitoring once daily and as needed.  Diagnosis:  E11.21  Non insulin-dependent 100 each 3  . aspirin EC 325 MG tablet Take 1 tablet (325 mg total) by mouth daily.    Marland Kitchen atorvastatin (LIPITOR) 80 MG tablet Take 1 tablet (80 mg total) by mouth daily at 6 PM. 90 tablet 2  . Blood Glucose Calibration (ACCU-CHEK AVIVA) SOLN Use to calibrate blood glucose machine as  recommended.  Diagnosis:  E11.21  Non-insulin dependent. 1 each 2  . Blood Glucose Monitoring Suppl (ACCU-CHEK AVIVA PLUS) w/Device KIT USE TO CHECK BLOOD SUGAR ONCE DAILY. NEED MD APPOINTMENT 1 kit 0  . carvedilol (COREG) 3.125 MG tablet Take 1 tablet (3.125 mg total) by mouth 2 (two) times daily. 180 tablet 3  . finasteride (PROSCAR) 5 MG tablet Take 1 tablet (5 mg total) by mouth daily. 90 tablet 2  . furosemide (LASIX) 40 MG tablet Take 1 tablet (40 mg total) by mouth daily. 90 tablet 3  . gabapentin (NEURONTIN) 300 MG capsule Take 2 capsules (600 mg total) by mouth 2 (two) times daily. 360 capsule 2  . glipiZIDE (GLUCOTROL) 5 MG tablet TAKE 1 TABLET EVERY DAY BEFORE BREAKFAST 90 tablet 0  . glucose blood (ACCU-CHEK AVIVA PLUS) test strip Test Blood Sugar 1 time daily: Dx:E11.27 100 each 3  . sacubitril-valsartan (ENTRESTO) 24-26 MG Take 1 tablet by mouth 2 (two) times daily. 60 tablet 11  . tamsulosin (FLOMAX) 0.4 MG CAPS capsule TAKE 1 CAPSULE EVERY DAY 90 capsule 1  . traMADol (ULTRAM) 50 MG tablet Take 1 tablet (50 mg total) by mouth every 6 (six) hours as needed for moderate pain. 30 tablet 0   No facility-administered medications prior to visit.         Objective:   Physical Exam Vitals:   05/27/18 1019  BP: 92/60  Pulse: 67  SpO2: 94%  Weight: 194 lb (88 kg)  Height: 5' 7" (1.702 m)   Gen: Pleasant, well-nourished, in no distress,  normal affect  ENT: No lesions,  mouth clear,  oropharynx clear, upper dentures, no postnasal drip  Neck: No JVD, some coarse UA noise  Lungs: No  use of accessory muscles, somewhat small breaths, no wheeze or crackles.   Cardiovascular: RRR, heart sounds normal, no murmur or gallops, no peripheral edema  Musculoskeletal: he has some joint deformities on fingers, esp B index fingers, no cyanosis or clubbing   Neuro: alert, non focal  Skin: Warm, no lesions or rash     Assessment & Plan:  Allergic rhinitis Not currently treated - may need to do so if hoarseness continues.   COPD (chronic obstructive pulmonary disease) ([place]) Seem to have benefited some degree from the [place].  He lost his wife since our last visit which makes the interpretation a bit difficult for him as he has been going through a lot of stress and grief.  At this point based on his perceived benefit I think is reasonable to continue it for another 3 months and determine whether to continue long-term.  We could consider stopping at that point, see if he misses it.  Also we can consider initiating an alternative to see if he gets more benefit.  Flu shot is up-to-date  Baltazar Apo, MD, PhD 05/27/2018, 10:42 AM [facility] [phone] or if no answer [phone]

## 2018-05-27 NOTE — Assessment & Plan Note (Signed)
Not currently treated - may need to do so if hoarseness continues.

## 2018-05-29 ENCOUNTER — Telehealth: Payer: Self-pay | Admitting: Internal Medicine

## 2018-05-29 MED ORDER — FUROSEMIDE 40 MG PO TABS: 40.0000 mg | ORAL_TABLET | Freq: Every day | ORAL | 3 refills | Status: DC

## 2018-05-29 MED ORDER — ATORVASTATIN CALCIUM 80 MG PO TABS: 80.0000 mg | ORAL_TABLET | Freq: Every day | ORAL | 2 refills | Status: DC

## 2018-05-29 NOTE — Telephone Encounter (Signed)
Left message for Pt.  Requested Pt come to office today at 1:30 pm and bring all of his medicine with him for review.

## 2018-05-29 NOTE — Telephone Encounter (Signed)
Pt came to office to meet with this nurse.    Pt's girlfriend recently passed and she had sorted Pt pills.  Went over all Pt medication, Pt brought medication with him to review.  Pt had all medications.    Made 2 small corrections to Pt home list.  All questions answered.  Pt very thankful for assistance.

## 2018-05-29 NOTE — Telephone Encounter (Signed)
See new note started 10/31

## 2018-05-29 NOTE — Telephone Encounter (Signed)
New Message:    Pt wants you to call and go over all his medicine with him please.Marland Kitchen His partner passed away and she took care all of his medicine.

## 2018-06-04 ENCOUNTER — Other Ambulatory Visit: Payer: Self-pay

## 2018-06-04 MED ORDER — FINASTERIDE 5 MG PO TABS: 5.0000 mg | ORAL_TABLET | Freq: Every day | ORAL | 2 refills | Status: DC

## 2018-06-05 ENCOUNTER — Encounter: Payer: Medicare HMO | Admitting: Family Medicine

## 2018-06-05 ENCOUNTER — Other Ambulatory Visit: Payer: Self-pay | Admitting: Family Medicine

## 2018-06-09 ENCOUNTER — Other Ambulatory Visit: Payer: Self-pay | Admitting: Family Medicine

## 2018-06-09 ENCOUNTER — Ambulatory Visit (INDEPENDENT_AMBULATORY_CARE_PROVIDER_SITE_OTHER): Payer: Medicare HMO

## 2018-06-09 ENCOUNTER — Telehealth: Payer: Self-pay | Admitting: Cardiology

## 2018-06-09 DIAGNOSIS — I5022 Chronic systolic (congestive) heart failure: Secondary | ICD-10-CM | POA: Diagnosis not present

## 2018-06-09 DIAGNOSIS — Z95810 Presence of automatic (implantable) cardiac defibrillator: Secondary | ICD-10-CM | POA: Diagnosis not present

## 2018-06-09 NOTE — Telephone Encounter (Signed)
Spoke with pt and reminded pt of remote transmission that is due today. Pt verbalized understanding.   

## 2018-06-11 ENCOUNTER — Telehealth: Payer: Self-pay | Admitting: Emergency Medicine

## 2018-06-11 MED ORDER — TIOTROPIUM BROMIDE-OLODATEROL 2.5-2.5 MCG/ACT IN AERS: 2.0000 | INHALATION_SPRAY | Freq: Every day | RESPIRATORY_TRACT | 3 refills | Status: DC

## 2018-06-11 NOTE — Telephone Encounter (Signed)
Called and spoke with patient he is requesting refills on stiolto. Per RB last OV note it was ok for patient to continue this. Refills sent to preferred pharmacy [facility]. Nothing further needed.

## 2018-06-13 NOTE — Progress Notes (Signed)
EPIC Encounter for ICM Monitoring  Patient Name: Randy [name] is a 72 y.o. male Date: 06/13/2018 Primary Care Physican: Lucille Passy, MD Primary Cardiologist:Taylor Electrophysiologist:Taylor Last Weight:192lbs baseline Today's Weight: 192 lbs        Heart Failure questions reviewed, pt asymptomatic.  Girlfriend passed away recently   Thoracic impedance normal but was abnormal suggesting fluid accumulation from 05/18/2018 - 05/29/2018.   Prescribed: Furosemide40 mg 1 tablet daily  Labs: 04/04/2018 Creatinine 2.3    BUN 50, Potassium 5.0, Sodium 140 03/19/2018 Creatinine 2.35, BUN 39, Potassium 4.8, Sodium 138, eGFR 27-31 02/19/2018 Creatinine2.24, BUN42, Potassium5.1, Sodium139, [id number] 01/06/2018 Creatinine2.09, BUN30, Potassium4.3, [id number], [id number]  11/08/2017 Creatinine1.70, BUN32, Potassium4.4, Sodium143, [id number]  09/07/2017 Creatinine1.79, BUN38, Potassium3.9, Sodium134, [id number] A complete set of results can be found in Results Review.  Recommendations: No changes.   Encouraged to call for fluid symptoms.  Advised to update DPR at next office visit.  Follow-up plan: ICM clinic phone appointment on 07/14/2018.     Copy of ICM check sent to Dr. Lovena Le.   3 month ICM trend: 06/13/2018    1 Year ICM trend:       Rosalene Billings, RN 06/13/2018 8:11 AM

## 2018-06-16 ENCOUNTER — Encounter: Payer: Self-pay | Admitting: Family Medicine

## 2018-06-16 ENCOUNTER — Ambulatory Visit (INDEPENDENT_AMBULATORY_CARE_PROVIDER_SITE_OTHER): Payer: Medicare HMO | Admitting: Family Medicine

## 2018-06-16 VITALS — BP 96/56 | HR 54 | Temp 97.8°F | Ht 67.25 in | Wt 196.8 lb

## 2018-06-16 DIAGNOSIS — Z125 Encounter for screening for malignant neoplasm of prostate: Secondary | ICD-10-CM

## 2018-06-16 DIAGNOSIS — E1121 Type 2 diabetes mellitus with diabetic nephropathy: Secondary | ICD-10-CM | POA: Diagnosis not present

## 2018-06-16 DIAGNOSIS — E785 Hyperlipidemia, unspecified: Secondary | ICD-10-CM

## 2018-06-16 DIAGNOSIS — N183 Chronic kidney disease, stage 3 unspecified: Secondary | ICD-10-CM

## 2018-06-16 DIAGNOSIS — R42 Dizziness and giddiness: Secondary | ICD-10-CM | POA: Diagnosis not present

## 2018-06-16 DIAGNOSIS — J449 Chronic obstructive pulmonary disease, unspecified: Secondary | ICD-10-CM | POA: Diagnosis not present

## 2018-06-16 DIAGNOSIS — Z0000 Encounter for general adult medical examination without abnormal findings: Secondary | ICD-10-CM

## 2018-06-16 DIAGNOSIS — F4321 Adjustment disorder with depressed mood: Secondary | ICD-10-CM | POA: Diagnosis not present

## 2018-06-16 DIAGNOSIS — I10 Essential (primary) hypertension: Secondary | ICD-10-CM | POA: Diagnosis not present

## 2018-06-16 DIAGNOSIS — E1122 Type 2 diabetes mellitus with diabetic chronic kidney disease: Secondary | ICD-10-CM | POA: Diagnosis not present

## 2018-06-16 DIAGNOSIS — I110 Hypertensive heart disease with heart failure: Secondary | ICD-10-CM | POA: Diagnosis not present

## 2018-06-16 LAB — TSH: TSH: 0.53 u[IU]/mL (ref 0.35–4.50)

## 2018-06-16 LAB — CBC WITH DIFFERENTIAL/PLATELET
Basophils Absolute: 0.1 10*3/uL (ref 0.0–0.1)
Basophils Relative: 1 % (ref 0.0–3.0)
EOS PCT: 6.3 % — AB (ref 0.0–5.0)
Eosinophils Absolute: 0.4 10*3/uL (ref 0.0–0.7)
HEMATOCRIT: 35 % — AB (ref 39.0–52.0)
Hemoglobin: 11.6 g/dL — ABNORMAL LOW (ref 13.0–17.0)
LYMPHS PCT: 20.3 % (ref 12.0–46.0)
Lymphs Abs: 1.3 10*3/uL (ref 0.7–4.0)
MCHC: 33 g/dL (ref 30.0–36.0)
MCV: 87.8 fl (ref 78.0–100.0)
MONOS PCT: 8.7 % (ref 3.0–12.0)
Monocytes Absolute: 0.6 10*3/uL (ref 0.1–1.0)
Neutro Abs: 4.1 10*3/uL (ref 1.4–7.7)
Neutrophils Relative %: 63.7 % (ref 43.0–77.0)
Platelets: 181 10*3/uL (ref 150.0–400.0)
RBC: 3.99 Mil/uL — AB (ref 4.22–5.81)
RDW: 14.1 % (ref 11.5–15.5)
WBC: 6.4 10*3/uL (ref 4.0–10.5)

## 2018-06-16 LAB — COMPREHENSIVE METABOLIC PANEL
ALK PHOS: 47 U/L (ref 39–117)
ALT: 10 U/L (ref 0–53)
AST: 14 U/L (ref 0–37)
Albumin: 4.3 g/dL (ref 3.5–5.2)
BUN: 47 mg/dL — ABNORMAL HIGH (ref 6–23)
CHLORIDE: 102 meq/L (ref 96–112)
CO2: 28 mEq/L (ref 19–32)
Calcium: 9.4 mg/dL (ref 8.4–10.5)
Creatinine, Ser: 2.36 mg/dL — ABNORMAL HIGH (ref 0.40–1.50)
GFR: 28.93 mL/min — AB (ref >60.00)
GLUCOSE: 110 mg/dL — AB (ref 70–99)
POTASSIUM: 5.4 meq/L — AB (ref 3.5–5.1)
Sodium: 141 mEq/L (ref 135–145)
TOTAL PROTEIN: 7 g/dL (ref 6.0–8.3)
Total Bilirubin: 0.6 mg/dL (ref 0.2–1.2)

## 2018-06-16 LAB — LIPID PANEL
Cholesterol: 179 mg/dL (ref 0–200)
HDL: 30.6 mg/dL — ABNORMAL LOW (ref >39.00)
NonHDL: 148.12
TRIGLYCERIDES: 290 mg/dL — AB (ref 0.0–149.0)
Total CHOL/HDL Ratio: 6
VLDL: 58 mg/dL — AB (ref 0.0–40.0)

## 2018-06-16 LAB — MICROALBUMIN / CREATININE URINE RATIO
CREATININE, U: 19.4 mg/dL
Microalb Creat Ratio: 4.7 mg/g (ref 0.0–30.0)
Microalb, Ur: 0.9 mg/dL (ref 0.0–1.9)

## 2018-06-16 LAB — LDL CHOLESTEROL, DIRECT: Direct LDL: 95 mg/dL

## 2018-06-16 LAB — POCT GLYCOSYLATED HEMOGLOBIN (HGB A1C): HEMOGLOBIN A1C: 6.4 % (ref 4.0–5.6)

## 2018-06-16 LAB — PSA: PSA: 0.22 ng/mL (ref 0.10–4.00)

## 2018-06-16 NOTE — Assessment & Plan Note (Signed)
Appropriate at this point.  I did advise that he call hospice for grief counseling. The patient indicates understanding of these issues and agrees with the plan.

## 2018-06-16 NOTE — Patient Instructions (Addendum)
Great to see you. I will call you with your lab results from today and you can view them online.    I am so sorry for your loss.  Please consider calling hospice for grief counseling.  Please schedule your eye exam.

## 2018-06-16 NOTE — Assessment & Plan Note (Signed)
Followed by renal but has not seen Dr. Abigail Butts in over a year. Will check renal function today.

## 2018-06-16 NOTE — Progress Notes (Signed)
Subjective:   Patient ID: Randy [name], male    DOB: [birth year], 72 y.o.   MRN: [id number]  Randy [name] is a pleasant 72 y.o. year old male who presents to clinic today with Annual Exam (Patient is here today for a AWV.  He is not currently fasting.  He will call to schedule his eye exam.  I asked him if his BP is normally this low and he said that it usually runs 100/60.  The only issue he has is that when he stays bent over a while he gets dizzy standing up.  States Cardiologist aware. )  on 06/16/2018  HPI:  Here for CPX and follow up of chronic medical conditions.  Unfortunately, his wife recently passed away, about a month ago.  Has not been to hospice grief counseling yet.  They did call him last week- he wants to think about it.  He feels is doing better than he was a month ago.  He is sleeping okay.  He is tearful but he says "I just miss her."  No SI or HI.  His adult children check on him often.  He is eating.  Health Maintenance  Topic Date Due  . URINE MICROALBUMIN  03/09/2017  . TETANUS/TDAP  01/13/2018  . HEMOGLOBIN A1C  03/02/2018  . OPHTHALMOLOGY EXAM  04/24/2018  . FOOT EXAM  06/17/2019  . COLONOSCOPY  02/07/2020  . INFLUENZA VACCINE  Completed  . Hepatitis C Screening  Completed  . PNA vac Low Risk Adult  Completed   CHF, CAD s/p CABG and VT with ICD- followed by Dr. Lovena Le.  Last saw him on 05/07/18.  Note reviewed. No changes made to current rxs- he is now on Entresto and coreg. He is dizzy when he bends over and sits up.  Not usually when he stands from a seated position. Per pt, cardiologist is aware. BP Readings from Last 3 Encounters:  06/16/18 (!) 96/56  05/27/18 92/60  05/07/18 102/60     COPD with Progressive dyspnea- saw Dr. Lamonte Sakai for this on 05/27/18- note reviewed. Advised to continue [place].  He does feel it helping with his breathing.  DM- on glipizide 5 mg daily.  Admits to drinking sweet tea. Checks fasting FSBS three times weekly-  usually 200s. No recent h/o hypoglycemia.    Lab Results  Component Value Date   HGBA1C 6.4 06/16/2018    HLD- taking lipitor 80 mg daily. Lab Results  Component Value Date   CHOL 198 04/22/2017   HDL 28.80 (L) 04/22/2017   LDLCALC 115 (H) 03/09/2016   LDLDIRECT 97.0 04/22/2017   TRIG (H) 04/22/2017    426.0 Triglyceride is over 400; calculations on Lipids are invalid.   CHOLHDL 7 04/22/2017   Lab Results  Component Value Date   ALT 15 (L) 08/28/2017   AST 39 08/28/2017   ALKPHOS 33 (L) 08/28/2017   BILITOT 0.6 08/28/2017   Current Outpatient Medications on File Prior to Visit  Medication Sig Dispense Refill  . ACCU-CHEK SOFTCLIX LANCETS lancets Use as instructed to check blood sugar once daily and as needed.  Diagnosis:  E11.21  Non- insulin dependent. 100 each 3  . Alcohol Swabs (B-D SINGLE USE SWABS REGULAR) PADS Use as instructed to clean area for glucose monitoring once daily and as needed.  Diagnosis:  E11.21  Non insulin-dependent 100 each 3  . aspirin EC 325 MG tablet Take 1 tablet (325 mg total) by mouth daily.    Marland Kitchen atorvastatin (  LIPITOR) 80 MG tablet Take 1 tablet (80 mg total) by mouth daily at 6 PM. 90 tablet 2  . Blood Glucose Calibration (ACCU-CHEK AVIVA) SOLN Use to calibrate blood glucose machine as recommended.  Diagnosis:  E11.21  Non-insulin dependent. 1 each 2  . Blood Glucose Monitoring Suppl (ACCU-CHEK AVIVA PLUS) w/Device KIT USE TO CHECK BLOOD SUGAR ONCE DAILY. NEED MD APPOINTMENT 1 kit 0  . carvedilol (COREG) 3.125 MG tablet Take 1 tablet (3.125 mg total) by mouth 2 (two) times daily. 180 tablet 3  . finasteride (PROSCAR) 5 MG tablet Take 1 tablet (5 mg total) by mouth daily. 90 tablet 2  . furosemide (LASIX) 40 MG tablet Take 1 tablet (40 mg total) by mouth daily. 90 tablet 3  . gabapentin (NEURONTIN) 300 MG capsule Take 2 capsules (600 mg total) by mouth 2 (two) times daily. 360 capsule 2  . glipiZIDE (GLUCOTROL) 5 MG tablet TAKE 1 TABLET EVERY  DAY BEFORE BREAKFAST 90 tablet 0  . glucose blood (ACCU-CHEK AVIVA PLUS) test strip Test Blood Sugar 1 time daily: Dx:E11.27 100 each 3  . sacubitril-valsartan (ENTRESTO) 24-26 MG Take 1 tablet by mouth 2 (two) times daily. 60 tablet 11  . tamsulosin (FLOMAX) 0.4 MG CAPS capsule TAKE 1 CAPSULE EVERY DAY 90 capsule 1  . Tiotropium Bromide-Olodaterol (STIOLTO RESPIMAT) 2.5-2.5 MCG/ACT AERS Inhale 2 puffs into the lungs daily. 1 Inhaler 3  . traMADol (ULTRAM) 50 MG tablet Take 1 tablet (50 mg total) by mouth every 6 (six) hours as needed for moderate pain. 30 tablet 0   No current facility-administered medications on file prior to visit.     No Known Allergies  Past Medical History:  Diagnosis Date  . AICD (automatic cardioverter/defibrillator) present 10/18/2016   "replaced 2009-[employer] device, Dr Lovena Le"  . Anemia   . Arthritis    "back" (08/27/2017)  . CAD (coronary artery disease)   . CHF (congestive heart failure) ([place])   . CKD (chronic kidney disease) stage 3, GFR 30-59 ml/min (HCC)    [name]  . Diverticulosis   . Dyslipidemia   . GERD (gastroesophageal reflux disease)   . Hypertension   . Neuromuscular disorder (HCC)    neuropathy- in hands  . Osteoarthritis    hips, hands  . Pacemaker   . Shortness of breath   . Sleep apnea    started a sleep study but didn't completed, states PCP told him he has sleep  apnea, doesn't use CPAP  . Systolic dysfunction   . Type 2 diabetes with nephropathy ([place])   . Vitamin D deficiency     Past Surgical History:  Procedure Laterality Date  . ANTERIOR CERVICAL DECOMP/DISCECTOMY FUSION    . BACK SURGERY    . CARPAL TUNNEL RELEASE Left   . CORONARY ARTERY BYPASS GRAFT N/A 09/02/2017   Procedure: CORONARY ARTERY BYPASS GRAFTING (CABG) x three , using left internal mammary artery and right leg greater saphenous vein harvested endoscopically;  Surgeon: [name], MD;  Location: [facility];  Service: Open Heart Surgery;  Laterality: N/A;  .  CYST EXCISION     "back"  . ICD IMPLANT N/A 10/18/2016   Procedure: ICD Implant- Upgrade to Dual Chamber;  Surgeon: Evans Lance, MD;  Location: [facility];  Service: Cardiovascular;  Laterality: N/A;  . INSERT / REPLACE / REMOVE PACEMAKER    . JOINT REPLACEMENT    . LEFT HEART CATH AND CORONARY ANGIOGRAPHY N/A 08/30/2017   Procedure: LEFT HEART  CATH AND CORONARY ANGIOGRAPHY;  Surgeon: Martinique, Peter M, MD;  Location: [facility];  Service: Cardiovascular;  Laterality: N/A;  . LUMBAR LAMINECTOMY Left 07/17/2013   Procedure: MICRODISCECTOMY LUMBAR LAMINECTOMY;  Surgeon: Marybelle Killings, MD;  Location: [facility];  Service: Orthopedics;  Laterality: Left;  Left L4 Laminotomy, Lateral Recess Decompression, Cyst Excision  . SHOULDER OPEN ROTATOR CUFF REPAIR Left   . TEE WITHOUT CARDIOVERSION N/A 09/02/2017   Procedure: TRANSESOPHAGEAL ECHOCARDIOGRAM (TEE);  Surgeon: [name], MD;  Location: [facility];  Service: Open Heart Surgery;  Laterality: N/A;  . TOTAL HIP ARTHROPLASTY Left 09/17/2012   Procedure: Left TOTAL HIP ARTHROPLASTY ANTERIOR APPROACH;  Surgeon: Marybelle Killings, MD;  Location: [facility];  Service: Orthopedics;  Laterality: Left;    Family History  Problem Relation Age of Onset  . Heart attack Father 72  . Diabetes Brother     Social History   Socioeconomic History  . Marital status: Divorced    Spouse name: Not on file  . Number of children: 2  . Years of education: Not on file  . Highest education level: Not on file  Occupational History  . 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 Financial resource strain: Not on file  . Food insecurity:    Worry: Not on file    Inability: Not on file  . Transportation needs:    Medical: Not on file    Non-medical: Not on file  Tobacco Use  . Smoking status: Former Smoker    Packs/day: 2.00    Years: 17.00    Pack years: 34.00    Types: Cigarettes    Last attempt to quit: 07/30/1984      Years since quitting: 33.9  . Smokeless tobacco: Never Used  Substance and Sexual Activity  . Alcohol use: Yes    Comment: 08/27/2017 "probably have had 6 beers in the last 6 months; drink when I play golf"  . Drug use: No  . Sexual activity: Never  Lifestyle  . Physical activity:    Days per week: Not on file    Minutes per session: Not on file  . Stress: Not on file  Relationships  . Social connections:    Talks on phone: Not on file    Gets together: Not on file    Attends religious service: Not on file    Active member of club or organization: Not on file    Attends meetings of clubs or organizations: Not on file    Relationship status: Not on file  . Intimate partner violence:    Fear of current or ex partner: Not on file    Emotionally abused: Not on file    Physically abused: Not on file    Forced sexual activity: Not on file  Other Topics Concern  . Not on file  Social History Narrative   Does not have a living will.   Desires CPR, does want life support if not futile.      The PMH, PSH, Social History, Family History, Medications, and allergies have been reviewed in [facility], and have been updated if relevant.     Review of Systems  Constitutional: Negative.   HENT: Negative.   Respiratory: Positive for shortness of breath.   Cardiovascular: Negative.   Gastrointestinal: Negative.   Endocrine: Negative.   Genitourinary: Negative.   Musculoskeletal: Negative.   Skin: Negative.   Neurological: Positive for  light-headedness. Negative for dizziness, tremors, seizures, syncope, facial asymmetry, speech difficulty, weakness, numbness and headaches.  Hematological: Negative.   Psychiatric/Behavioral: Positive for dysphoric mood. Negative for agitation, behavioral problems, confusion, decreased concentration, hallucinations, self-injury, sleep disturbance and suicidal ideas. The patient is not nervous/anxious and is not hyperactive.   All other systems reviewed and  are negative.      Objective:    BP (!) 96/56 (BP Location: Left Arm, Patient Position: Sitting, Cuff Size: Normal)   Pulse (!) 54   Temp 97.8 F (36.6 C) (Oral)   Ht 5' 7.25" (1.708 m)   Wt 196 lb 12.8 oz (89.3 kg)   SpO2 97%   BMI 30.59 kg/m    Physical Exam  Constitutional: He is oriented to person, place, and time. He appears well-developed and well-nourished. No distress.  HENT:  Head: Normocephalic and atraumatic.  Eyes: EOM are normal.  Neck: Normal range of motion.  Cardiovascular: Regular rhythm. Bradycardia present.  Pulmonary/Chest: Effort normal.  Abdominal: Soft. Bowel sounds are normal.  Musculoskeletal: Normal range of motion.  Neurological: He is alert and oriented to person, place, and time. No cranial nerve deficit.  Skin: Skin is warm and dry. He is not diaphoretic.  Psychiatric: He has a normal mood and affect. His behavior is normal. Judgment and thought content normal.  Tearful when he talks about his wife but appropriate  Nursing note and vitals reviewed.         Assessment & Plan:   Visit for well man health check  Type 2 diabetes with nephropathy ([place]) - Plan: POCT HgB A1C, Microalbumin / creatinine urine ratio  CKD (chronic kidney disease) stage 3, GFR 30-59 ml/min (HCC)  Hyperlipidemia, unspecified hyperlipidemia type - Plan: Lipid panel, Comprehensive metabolic panel, CBC with Differential/Platelet, TSH  Grief  Dizziness  Screening for malignant neoplasm of prostate - Plan: PSA  Essential hypertension No follow-ups on file.

## 2018-06-16 NOTE — Assessment & Plan Note (Addendum)
Intermittent- Hypotensive with symptoms of orthostasis but he feels this is getting better now that he is drinking more water.  Was understandably not drinking as much water immediately after his wife died.  He does not want to make changes to his rxs and states that cardiology is aware.   He is on several medications that can lower blood pressure including coreg, Entresto and Proscar.  Not changes made today but he will let me know immediately if he develops dizziness again.

## 2018-06-16 NOTE — Assessment & Plan Note (Signed)
Lab Results  Component Value Date   HGBA1C 6.4 06/16/2018   Controlled.  Foot exam done today.  Pneumovax immunizations UTD. He will call to schedule eye exam. No changes made to rxs.

## 2018-06-16 NOTE — Assessment & Plan Note (Signed)
Followed by cardiology.  No changes made to his rxs.

## 2018-06-16 NOTE — Assessment & Plan Note (Signed)
Followed by pulmonary. He has noticed some improvement with Stiliolto. No changes made to rxs.

## 2018-06-16 NOTE — Progress Notes (Signed)
oc

## 2018-06-16 NOTE — Assessment & Plan Note (Signed)
Reviewed preventive care protocols, scheduled due services, and updated immunizations Discussed nutrition, exercise, diet, and healthy lifestyle.  

## 2018-06-16 NOTE — Assessment & Plan Note (Signed)
Continue lipitor. Check labs today.

## 2018-06-17 ENCOUNTER — Other Ambulatory Visit (INDEPENDENT_AMBULATORY_CARE_PROVIDER_SITE_OTHER): Payer: Medicare HMO

## 2018-06-17 ENCOUNTER — Other Ambulatory Visit: Payer: Self-pay

## 2018-06-17 DIAGNOSIS — D649 Anemia, unspecified: Secondary | ICD-10-CM | POA: Diagnosis not present

## 2018-06-17 LAB — FERRITIN: Ferritin: 559.1 ng/mL — ABNORMAL HIGH (ref 22.0–322.0)

## 2018-06-17 LAB — IBC PANEL
IRON: 124 ug/dL (ref 42–165)
SATURATION RATIOS: 36.4 % (ref 20.0–50.0)
TRANSFERRIN: 243 mg/dL (ref 212.0–360.0)

## 2018-06-18 ENCOUNTER — Telehealth: Payer: Self-pay

## 2018-06-18 NOTE — Telephone Encounter (Signed)
Spoke with pt and reminded pt of remote transmission that is due today. Pt verbalized understanding.   

## 2018-06-19 ENCOUNTER — Encounter: Payer: Self-pay | Admitting: Cardiology

## 2018-06-20 ENCOUNTER — Telehealth: Payer: Self-pay | Admitting: Gastroenterology

## 2018-06-20 NOTE — Telephone Encounter (Signed)
-----   Message from [place] sent at 06/19/2018 11:13 AM EST ----- Marsa Aris, MD: Here are recent labs on mutual patient whom you saw last in 11.2018.  I am also sending these results to [facility]/plz advise if you would like me to have patient call your office to schedule a visit per the results/and FYI: wife passed/thx dmf

## 2018-06-20 NOTE — Telephone Encounter (Signed)
Thanks so much for your input and for taking such great care of this patient. I hope you have a nice holiday! Arnette Norris

## 2018-06-20 NOTE — Telephone Encounter (Signed)
Thanks for sending the results.  His labs are still consistent with anemia of chronic kidney disease, though Hgb improved from earlier in the year.  Iron stores replete.  Perhaps Epogen would be in order - and I see you copied this to his nephrologist as well.  He does not need an endoscopic workup for this.  Next routine colonoscopy for him scheduled July 2021.  Let me know if you need anything else.  Wilfrid Lund, MD [facility]

## 2018-06-25 DIAGNOSIS — H5203 Hypermetropia, bilateral: Secondary | ICD-10-CM | POA: Diagnosis not present

## 2018-07-08 DIAGNOSIS — Z7984 Long term (current) use of oral hypoglycemic drugs: Secondary | ICD-10-CM | POA: Diagnosis not present

## 2018-07-08 DIAGNOSIS — E119 Type 2 diabetes mellitus without complications: Secondary | ICD-10-CM | POA: Diagnosis not present

## 2018-07-09 ENCOUNTER — Other Ambulatory Visit: Payer: Self-pay | Admitting: Family Medicine

## 2018-07-09 NOTE — Telephone Encounter (Signed)
Copied from [place] [id number]. Topic: Quick Communication - Rx Refill/Question >> Jul 09, 2018 10:49 AM Rayann Heman wrote: Medication: ACCU-CHEK SOFTCLIX LANCETS lancets [[id number]]   Has the patient contacted their pharmacy? No Preferred Pharmacy (with phone number or street name): [facility], [place] [phone] (Phone) [phone] (Fax)  Agent: Please be advised that RX refills may take up to 3 business days. We ask that you follow-up with your pharmacy.

## 2018-07-10 NOTE — Telephone Encounter (Signed)
Requested medication (s) are due for refill today: Yes  Requested medication (s) are on the active medication list: Yes  Last refill:  07/24/16  Future visit scheduled: Yes  Notes to clinic:  Expired Rx, unable to refill.     Requested Prescriptions  Pending Prescriptions Disp Refills   ACCU-CHEK SOFTCLIX LANCETS lancets 100 each 3    Sig: Use as instructed to check blood sugar once daily and as needed.  Diagnosis:  E11.21  Non- insulin dependent.     Endocrinology: Diabetes - Testing Supplies Passed - 07/09/2018 11:36 AM      Passed - Valid encounter within last 12 months    Recent Outpatient Visits          3 weeks ago Visit for well man health check   [facility] Senters, Marciano Sequin, MD      Future Appointments            In 1 month Byrum, Rose Fillers, MD [facility]

## 2018-07-11 MED ORDER — ACCU-CHEK SOFTCLIX LANCETS MISC: 3 refills | Status: DC

## 2018-07-14 ENCOUNTER — Telehealth: Payer: Self-pay | Admitting: Internal Medicine

## 2018-07-14 ENCOUNTER — Telehealth: Payer: Self-pay

## 2018-07-14 NOTE — Telephone Encounter (Signed)
LMOVM reminding pt to send remote transmission.   

## 2018-07-14 NOTE — Telephone Encounter (Signed)
Called pt and left message informing pt that I called his pharmacy and they stated that the pt had requested refill too soon and that his medication has been sent to him on 06/07/18 for a 90 day supply and that pt would not be able to request a refill until 07/29/18 and to call our office if pt has any other problems, questions or concerns.

## 2018-07-17 DIAGNOSIS — N184 Chronic kidney disease, stage 4 (severe): Secondary | ICD-10-CM | POA: Diagnosis not present

## 2018-07-17 DIAGNOSIS — M069 Rheumatoid arthritis, unspecified: Secondary | ICD-10-CM | POA: Diagnosis not present

## 2018-07-17 DIAGNOSIS — I509 Heart failure, unspecified: Secondary | ICD-10-CM | POA: Diagnosis not present

## 2018-07-17 DIAGNOSIS — N400 Enlarged prostate without lower urinary tract symptoms: Secondary | ICD-10-CM | POA: Diagnosis not present

## 2018-07-17 DIAGNOSIS — I129 Hypertensive chronic kidney disease with stage 1 through stage 4 chronic kidney disease, or unspecified chronic kidney disease: Secondary | ICD-10-CM | POA: Diagnosis not present

## 2018-07-21 NOTE — Progress Notes (Signed)
No ICM remote transmission received for 07/14/2018 and next ICM transmission scheduled for 07/29/2018.

## 2018-07-29 ENCOUNTER — Ambulatory Visit (INDEPENDENT_AMBULATORY_CARE_PROVIDER_SITE_OTHER): Payer: Medicare HMO

## 2018-07-29 DIAGNOSIS — Z95810 Presence of automatic (implantable) cardiac defibrillator: Secondary | ICD-10-CM

## 2018-07-29 DIAGNOSIS — I5022 Chronic systolic (congestive) heart failure: Secondary | ICD-10-CM | POA: Diagnosis not present

## 2018-07-29 NOTE — Progress Notes (Signed)
EPIC Encounter for ICM Monitoring  Patient Name: Randy [name] is a 72 y.o. male Date: 07/29/2018 Primary Care Physican: Lucille Passy, MD Primary Cardiologist:Taylor Electrophysiologist:Taylor Last Weight:192lbs baseline Today's Weight: unknown  Transmission reviewed.  Thoracic impedance normal.   Prescribed:Furosemide40 mg 1 tablet daily  Labs: 04/04/2018 Creatinine 2.3    BUN 50, Potassium 5.0, Sodium 140 03/19/2018 Creatinine 2.35, BUN 39, Potassium 4.8, Sodium 138, eGFR 27-31 02/19/2018 Creatinine2.24, BUN42, Potassium5.1, Sodium139, [id number] 01/06/2018 Creatinine2.09, BUN30, Potassium4.3, [id number], [id number]  11/08/2017 Creatinine1.70, BUN32, Potassium4.4, Sodium143, [id number]  09/07/2017 Creatinine1.79, BUN38, Potassium3.9, Sodium134, [id number] A complete set of results can be found in Results Review.  Recommendations:  None    Follow up plan: ICM clinic phone appointment on 08/29/2018.   Copy of ICM check sent to Dr. Lovena Le.      3 month ICM trend: 07/29/2018    1 Year ICM trend:     Rosalene Billings, RN 07/29/2018 12:59 PM

## 2018-08-29 ENCOUNTER — Ambulatory Visit (INDEPENDENT_AMBULATORY_CARE_PROVIDER_SITE_OTHER): Payer: Medicare HMO

## 2018-08-29 DIAGNOSIS — I5022 Chronic systolic (congestive) heart failure: Secondary | ICD-10-CM | POA: Diagnosis not present

## 2018-08-29 DIAGNOSIS — Z95810 Presence of automatic (implantable) cardiac defibrillator: Secondary | ICD-10-CM | POA: Diagnosis not present

## 2018-09-02 NOTE — Progress Notes (Signed)
EPIC Encounter for ICM Monitoring  Patient Name: Randy [name] is a 72 y.o. male Date: 09/02/2018 Primary Care Physican: Aron, Talia M, MD Primary Cardiologist:Taylor Electrophysiologist:Taylor Last Weight: 192lbs baseline Today's Weight: 198 lbs  Spoke with patient. He reported feeling fine and denied any symptoms.  Thoracic impedance normal.   Prescribed:Furosemide40 mg 1 tablet daily  Labs: 04/04/2018 Creatinine 2.3 BUN 50, Potassium 5.0, Sodium 140 03/19/2018 Creatinine 2.35, BUN 39, Potassium 4.8, Sodium 138, eGFR 27-31 02/19/2018 Creatinine2.24, BUN42, Potassium5.1, Sodium139, [id number] 01/06/2018 Creatinine2.09, BUN30, Potassium4.3, Sodium142, EGFR31-35  11/08/2017 Creatinine1.70, BUN32, Potassium4.4, Sodium143, EGFR36-46  09/07/2017 Creatinine1.79, BUN38, Potassium3.9, Sodium134, EGFR36-42 A complete set of results can be found in Results Review.  Recommendations:  No changes and encouraged to call for fluid symptoms.    Follow up plan: ICM clinic phone appointment on 10/06/2018.   3 month ICM trend: 08/31/2018    1 Year ICM trend:        S , RN 09/02/2018 5:17 PM   

## 2018-09-03 ENCOUNTER — Ambulatory Visit: Payer: Medicare HMO | Admitting: Emergency Medicine

## 2018-09-10 ENCOUNTER — Other Ambulatory Visit: Payer: Self-pay | Admitting: *Deleted

## 2018-09-10 MED ORDER — SACUBITRIL-VALSARTAN 24-26 MG PO TABS: 1.0000 | ORAL_TABLET | Freq: Two times a day (BID) | ORAL | 3 refills | Status: DC

## 2018-10-06 ENCOUNTER — Encounter (INDEPENDENT_AMBULATORY_CARE_PROVIDER_SITE_OTHER): Payer: Self-pay | Admitting: Family Medicine

## 2018-10-06 ENCOUNTER — Ambulatory Visit (INDEPENDENT_AMBULATORY_CARE_PROVIDER_SITE_OTHER): Payer: Self-pay

## 2018-10-06 ENCOUNTER — Ambulatory Visit (INDEPENDENT_AMBULATORY_CARE_PROVIDER_SITE_OTHER): Payer: Medicare HMO

## 2018-10-06 ENCOUNTER — Ambulatory Visit (INDEPENDENT_AMBULATORY_CARE_PROVIDER_SITE_OTHER): Payer: Medicare HMO | Admitting: Family Medicine

## 2018-10-06 DIAGNOSIS — Z95810 Presence of automatic (implantable) cardiac defibrillator: Secondary | ICD-10-CM

## 2018-10-06 DIAGNOSIS — M79645 Pain in left finger(s): Secondary | ICD-10-CM | POA: Diagnosis not present

## 2018-10-06 DIAGNOSIS — I5022 Chronic systolic (congestive) heart failure: Secondary | ICD-10-CM

## 2018-10-06 DIAGNOSIS — M79671 Pain in right foot: Secondary | ICD-10-CM

## 2018-10-06 MED ORDER — METHYLPREDNISOLONE ACETATE 40 MG/ML IJ SUSP: 40.0000 mg | Freq: Once | INTRAMUSCULAR | Status: DC

## 2018-10-06 NOTE — Progress Notes (Signed)
Office Visit Note   Patient: Randy [name]           Date of Birth: March 17, 1946           MRN: [id number] Visit Date: 10/06/2018 Requested by: Lucille Passy, MD [place], [place] [id number] PCP: Lucille Passy, MD  Subjective: Chief Complaint  Patient presents with  . Left Middle Finger - Pain    Pain and swelling x 2-3 weeks - NKI.  Marland Kitchen Right Foot - Pain    Right foot pain - top of foot - x 2-3 weeks. NKI. Swells. By the afternoon, has to remove the shoe due to pain. Wears bedroom slippers at home due to pain.    HPI: He is here with left third finger and right foot pain and swelling.  His finger started bothering him about 2 or 3 weeks ago, no injury.  Significant swelling of the DIP joint as well as some pain.  He feels like the pain is improving but the swelling has not.  He has not had any drainage, no fever or chills.  Several months ago he noticed a knot on top of his right foot.  It has not gotten any better, it bothers him when he wears shoes.  He has a history of arthritis affecting his fingers.  Denies any history of gout.               ROS: He has a history of cardiovascular disease, COPD, hypertension and diabetes.  He has vitamin D deficiency and chronic kidney disease.  Other systems were negative.    Objective: Vital Signs: There were no vitals taken for this visit.  Physical Exam:  Left hand: There is significant swelling of the third finger DIP joint with no warmth or erythema.  There is peeling of his overlying skin.  There is the appearance of possible tophus on the dorsal ulnar side. Right foot: In the midfoot there is prominent nodule that feels like a ganglion cyst.  It is minimally tender to palpation, not warm to the touch.  Imaging: X-rays left finger: Severe DIP DJD with soft tissue calcification.  X-rays right foot: Moderate midfoot DJD with soft tissue swelling but no calcification.    Assessment & Plan: 1.  Left third  finger pain and swelling, suspicious for gouty arthropathy. -Discussed options with patient and elected to aspirate the joint and send the fluid for crystal analysis. -Watch for signs of infection.  2.  Right foot dorsal nodule, suspect ganglion cyst. -Discussed with patient and elected to attempt aspiration and provided injection.  Follow-up PRN.     Procedures: Left third finger DIP aspiration and injection: After sterile prep with Betadine, injected 1 cc 1% lidocaine without epinephrine then aspirated about 2 cc of blood-tinged synovial fluid with tophaceous material, then injected about 20 mg of methylprednisolone.  I believe most of it drained back out as did a lot of other tophaceous material.  Right foot dorsal ganglion cyst injection: After sterile prep with Betadine, injected 1 cc 1% lidocaine without epinephrine, then attempted aspiration of the nodule with an 18-gauge needle but only got a scant amount of tophaceous material in the syringe, then injected 1 cc methylprednisolone.    PMFS History: Patient Active Problem List   Diagnosis Date Noted  . Grief 06/16/2018  . Dizziness 06/16/2018  . Allergic rhinitis 04/24/2018  . S/P CABG x 3 09/02/2017  . Leukocytosis 08/27/2017  . Visit for well man  health check 05/07/2017  . COPD (chronic obstructive pulmonary disease) ([place]) 05/07/2017  . Chronic systolic CHF (congestive heart failure) ([place]) 10/18/2016  . Generalized OA 06/22/2014  . CKD (chronic kidney disease) stage 3, GFR 30-59 ml/min (HCC)   . Synovial cyst of lumbar spine 07/17/2013    Class: Diagnosis of  . Osteoarthritis of left hip 09/17/2012  . Automatic implantable cardioverter-defibrillator in situ - St Jude 05/21/2012  . Chronic systolic heart failure ([place]) 05/01/2011  . IMPOTENCE OF ORGANIC ORIGIN 02/20/2010  . Unspecified vitamin D deficiency 02/14/2010  . Type 2 diabetes with nephropathy ([place]) 01/11/2010  . ICD-St.Jude 05/10/2009  . HLD (hyperlipidemia)  02/24/2008  . Essential hypertension 02/24/2008  . Non-ST elevation (NSTEMI) myocardial infarction ([place]) 02/24/2008  . HYPERSOMNIA UNSPECIFIED 02/24/2008   Past Medical History:  Diagnosis Date  . AICD (automatic cardioverter/defibrillator) present 10/18/2016   "replaced 2009-St. Jude device, Dr Lovena Le"  . Anemia   . Arthritis    "back" (08/27/2017)  . CAD (coronary artery disease)   . CHF (congestive heart failure) ([place])   . CKD (chronic kidney disease) stage 3, GFR 30-59 ml/min (HCC)    [name]  . Diverticulosis   . Dyslipidemia   . GERD (gastroesophageal reflux disease)   . Hypertension   . Neuromuscular disorder (HCC)    neuropathy- in hands  . Osteoarthritis    hips, hands  . Pacemaker   . Shortness of breath   . Sleep apnea    started a sleep study but didn't completed, states PCP told him he has sleep  apnea, doesn't use CPAP  . Systolic dysfunction   . Type 2 diabetes with nephropathy ([place])   . Vitamin D deficiency     Family History  Problem Relation Age of Onset  . Heart attack Father 38  . Diabetes Brother     Past Surgical History:  Procedure Laterality Date  . ANTERIOR CERVICAL DECOMP/DISCECTOMY FUSION    . BACK SURGERY    . CARPAL TUNNEL RELEASE Left   . CORONARY ARTERY BYPASS GRAFT N/A 09/02/2017   Procedure: CORONARY ARTERY BYPASS GRAFTING (CABG) x three , using left internal mammary artery and right leg greater saphenous vein harvested endoscopically;  Surgeon: [name], MD;  Location: [facility];  Service: Open Heart Surgery;  Laterality: N/A;  . CYST EXCISION     "back"  . ICD IMPLANT N/A 10/18/2016   Procedure: ICD Implant- Upgrade to Dual Chamber;  Surgeon: Evans Lance, MD;  Location: [facility];  Service: Cardiovascular;  Laterality: N/A;  . INSERT / REPLACE / REMOVE PACEMAKER    . JOINT REPLACEMENT    . LEFT HEART CATH AND CORONARY ANGIOGRAPHY N/A 08/30/2017   Procedure: LEFT HEART CATH AND CORONARY ANGIOGRAPHY;  Surgeon: Martinique, Peter  M, MD;  Location: [facility];  Service: Cardiovascular;  Laterality: N/A;  . LUMBAR LAMINECTOMY Left 07/17/2013   Procedure: MICRODISCECTOMY LUMBAR LAMINECTOMY;  Surgeon: Marybelle Killings, MD;  Location: [facility];  Service: Orthopedics;  Laterality: Left;  Left L4 Laminotomy, Lateral Recess Decompression, Cyst Excision  . SHOULDER OPEN ROTATOR CUFF REPAIR Left   . TEE WITHOUT CARDIOVERSION N/A 09/02/2017   Procedure: TRANSESOPHAGEAL ECHOCARDIOGRAM (TEE);  Surgeon: [name], MD;  Location: [facility];  Service: Open Heart Surgery;  Laterality: N/A;  . TOTAL HIP ARTHROPLASTY Left 09/17/2012   Procedure: Left TOTAL HIP ARTHROPLASTY ANTERIOR APPROACH;  Surgeon: Marybelle Killings, MD;  Location: [facility];  Service: Orthopedics;  Laterality: Left;  Social History   Occupational History  . 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 Smoking status: Former Smoker    Packs/day: 2.00    Years: 17.00    Pack years: 34.00    Types: Cigarettes    Last attempt to quit: 07/30/1984    Years since quitting: 34.2  . Smokeless tobacco: Never Used  Substance and Sexual Activity  . Alcohol use: Yes    Comment: 08/27/2017 "probably have had 6 beers in the last 6 months; drink when I play golf"  . Drug use: No  . Sexual activity: Never

## 2018-10-06 NOTE — Progress Notes (Signed)
EPIC Encounter for ICM Monitoring  Patient Name: Randy [name] is a 73 y.o. male Date: 10/06/2018 Primary Care Physican: Lucille Passy, MD Primary Cardiologist:Taylor Electrophysiologist:Taylor Last Weight: 198lbs baseline Today's Weight: 198 lbs  Spoke with patient. He reported feeling fine and denied any symptoms.  Thoracic impedance abnormal suggesting fluid since 09/29/2018 but almost back at baseline normal today.  Prescribed: Furosemide40 mg 1 tablet daily  Labs: 06/16/2018 Creatinine 2.36, BUN 47, Potassium 5.4, Sodium 141, GFR 28.93 04/04/2018 Creatinine 2.3 BUN 50, Potassium 5.0, Sodium 140 03/19/2018 Creatinine 2.35, BUN 39, Potassium 4.8, Sodium 138, GFR 27-31 02/19/2018 Creatinine2.24, BUN42, Potassium5.1, Sodium139, FUX32-35 01/06/2018 Creatinine2.09, BUN30, Potassium4.3, TDDUKG254, YHC62-37  11/08/2017 Creatinine1.70, BUN32, Potassium4.4, Sodium143, SEG31-51  09/07/2017 Creatinine1.79, BUN38, Potassium3.9, Sodium134, VOH60-73 A complete set of results can be found in Results Review.  Recommendations:No changes and encouraged to call for fluid symptoms.  Office appointment 11/11/2018 with Dr Lovena Le.  Follow up plan: ICM clinic phone appointment on5/18/2020.  3 month ICM trend: 10/06/2018    1 Year ICM trend:       Rosalene Billings, RN 10/06/2018 11:08 AM

## 2018-10-07 ENCOUNTER — Telehealth (INDEPENDENT_AMBULATORY_CARE_PROVIDER_SITE_OTHER): Payer: Self-pay | Admitting: Family Medicine

## 2018-10-07 DIAGNOSIS — M1009 Idiopathic gout, multiple sites: Secondary | ICD-10-CM | POA: Insufficient documentation

## 2018-10-07 DIAGNOSIS — M1A09X0 Idiopathic chronic gout, multiple sites, without tophus (tophi): Secondary | ICD-10-CM

## 2018-10-07 DIAGNOSIS — M1A09X1 Idiopathic chronic gout, multiple sites, with tophus (tophi): Secondary | ICD-10-CM

## 2018-10-07 LAB — SYNOVIAL CELL COUNT + DIFF, W/ CRYSTALS
Basophils, %: 0 %
Eosinophils-Synovial: 0 % (ref 0–2)
Lymphocytes-Synovial Fld: 55 % (ref 0–74)
Monocyte/Macrophage: 11 % (ref 0–69)
NEUTROPHIL, SYNOVIAL: 34 % — AB (ref 0–24)
Synoviocytes, %: 0 % (ref 0–15)
WBC, SYNOVIAL: 12560 {cells}/uL — AB (ref <150)

## 2018-10-07 NOTE — Telephone Encounter (Signed)
Fluid analysis confirms gout crystals.

## 2018-10-09 NOTE — Telephone Encounter (Signed)
I advised the patient of the fluid results. He said he if feeling much better.  He wrote down the name of the test to be drawn (uric acid level), so that he can take it to his PCP for his next blood draw time.

## 2018-10-13 ENCOUNTER — Other Ambulatory Visit: Payer: Self-pay

## 2018-10-13 ENCOUNTER — Ambulatory Visit (INDEPENDENT_AMBULATORY_CARE_PROVIDER_SITE_OTHER): Payer: Medicare HMO | Admitting: Family Medicine

## 2018-10-13 DIAGNOSIS — M79671 Pain in right foot: Secondary | ICD-10-CM

## 2018-10-13 DIAGNOSIS — M79645 Pain in left finger(s): Secondary | ICD-10-CM | POA: Diagnosis not present

## 2018-10-13 MED ORDER — METHYLPREDNISOLONE ACETATE 40 MG/ML IJ SUSP: 20.0000 mg | Freq: Once | INTRAMUSCULAR | Status: DC

## 2018-10-13 NOTE — Progress Notes (Signed)
Office Visit Note   Patient: Randy [name]           Date of Birth: Nov 16, 1945           MRN: [id number] Visit Date: 10/13/2018 Requested by: Lucille Passy, MD [place], [place] [id number] PCP: Lucille Passy, MD  Subjective: Chief Complaint  Patient presents with  . Left Middle Finger - Pain, Follow-up    Finger felt better x 2 days, but it swelled again - very painful.    HPI: He is here with recurrent left third finger swelling and pain.  Aspiration gave temporary relief, but he is swelling came back.  No fever or chills, no further drainage.  Blood sugars have been well controlled.  His right foot dorsal ganglion cyst is no longer hurting like it was, but it has not decreased in size.  He asked me to try to aspirate it again.  I was not able to get much fluid out of it the first time but was able to inject it with cortisone.              ROS:   All other systems were reviewed and are negative.  Objective: Vital Signs: There were no vitals taken for this visit.  Physical Exam:  General:  Alert and oriented, in no acute distress. Pulm:  Breathing unlabored. Psy:  Normal mood, congruent affect. Skin: His third finger left hand has no sign of cellulitis.  Overlying skin continues to peel. Left hand: Third finger DIP joint is swollen again but minimally tender.  Chronic stiffness in all of his fingers. Right foot: Dorsal ganglion cyst is slightly smaller than it was, but still very prominent.  No warmth or erythema, no tenderness.  Imaging: None today.  Assessment & Plan: 1.  Persistent left third finger swelling with gouty arthropathy. -Discussed with patient and elected to inject one more time with steroid.  I believe that the first time when we aspirated and injected, most of the steroid most likely leaked out.  2.  Right foot dorsal ganglion cyst -1 more attempt at aspiration today.  If it starts to bother him again, we will schedule consultation with  Dr. Sharol Given for surgical excision.     Procedures: Left third finger DIP injection: After sterile prep with Betadine, injected 20 cc methylprednisolone without complication.  Right foot dorsal ganglion cyst aspiration: After sterile prep with Betadine, injected 2 cc 1% lidocaine without epinephrine then using a 16-gauge needle, attempted to aspirate but was only able to get a scant amount of gelatinous yellow fluid with tophaceous material.    PMFS History: Patient Active Problem List   Diagnosis Date Noted  . Idiopathic gout of multiple sites 10/07/2018  . Grief 06/16/2018  . Dizziness 06/16/2018  . Allergic rhinitis 04/24/2018  . S/P CABG x 3 09/02/2017  . Leukocytosis 08/27/2017  . Visit for well man health check 05/07/2017  . COPD (chronic obstructive pulmonary disease) ([facility]) 05/07/2017  . Chronic systolic CHF (congestive heart failure) ([place]) 10/18/2016  . Generalized OA 06/22/2014  . CKD (chronic kidney disease) stage 3, GFR 30-59 ml/min (HCC)   . Synovial cyst of lumbar spine 07/17/2013    Class: Diagnosis of  . Osteoarthritis of left hip 09/17/2012  . Automatic implantable cardioverter-defibrillator in situ - St Jude 05/21/2012  . Chronic systolic heart failure ([facility]) 05/01/2011  . IMPOTENCE OF ORGANIC ORIGIN 02/20/2010  . Unspecified vitamin D deficiency 02/14/2010  . Type 2 diabetes  with nephropathy ([place]) 01/11/2010  . ICD-St.Jude 05/10/2009  . HLD (hyperlipidemia) 02/24/2008  . Essential hypertension 02/24/2008  . Non-ST elevation (NSTEMI) myocardial infarction ([place]) 02/24/2008  . HYPERSOMNIA UNSPECIFIED 02/24/2008   Past Medical History:  Diagnosis Date  . AICD (automatic cardioverter/defibrillator) present 10/18/2016   "replaced 2009-St. Jude device, Dr Lovena Le"  . Anemia   . Arthritis    "back" (08/27/2017)  . CAD (coronary artery disease)   . CHF (congestive heart failure) ([place])   . CKD (chronic kidney disease) stage 3, GFR 30-59 ml/min (HCC)    [name]  .  Diverticulosis   . Dyslipidemia   . GERD (gastroesophageal reflux disease)   . Hypertension   . Neuromuscular disorder (HCC)    neuropathy- in hands  . Osteoarthritis    hips, hands  . Pacemaker   . Shortness of breath   . Sleep apnea    started a sleep study but didn't completed, states PCP told him he has sleep  apnea, doesn't use CPAP  . Systolic dysfunction   . Type 2 diabetes with nephropathy ([facility])   . Vitamin D deficiency     Family History  Problem Relation Age of Onset  . Heart attack Father 58  . Diabetes Brother     Past Surgical History:  Procedure Laterality Date  . ANTERIOR CERVICAL DECOMP/DISCECTOMY FUSION    . BACK SURGERY    . CARPAL TUNNEL RELEASE Left   . CORONARY ARTERY BYPASS GRAFT N/A 09/02/2017   Procedure: CORONARY ARTERY BYPASS GRAFTING (CABG) x three , using left internal mammary artery and right leg greater saphenous vein harvested endoscopically;  Surgeon: [name], MD;  Location: [facility];  Service: Open Heart Surgery;  Laterality: N/A;  . CYST EXCISION     "back"  . ICD IMPLANT N/A 10/18/2016   Procedure: ICD Implant- Upgrade to Dual Chamber;  Surgeon: Evans Lance, MD;  Location: [facility];  Service: Cardiovascular;  Laterality: N/A;  . INSERT / REPLACE / REMOVE PACEMAKER    . JOINT REPLACEMENT    . LEFT HEART CATH AND CORONARY ANGIOGRAPHY N/A 08/30/2017   Procedure: LEFT HEART CATH AND CORONARY ANGIOGRAPHY;  Surgeon: Martinique, Peter M, MD;  Location: [facility];  Service: Cardiovascular;  Laterality: N/A;  . LUMBAR LAMINECTOMY Left 07/17/2013   Procedure: MICRODISCECTOMY LUMBAR LAMINECTOMY;  Surgeon: Marybelle Killings, MD;  Location: [facility];  Service: Orthopedics;  Laterality: Left;  Left L4 Laminotomy, Lateral Recess Decompression, Cyst Excision  . SHOULDER OPEN ROTATOR CUFF REPAIR Left   . TEE WITHOUT CARDIOVERSION N/A 09/02/2017   Procedure: TRANSESOPHAGEAL ECHOCARDIOGRAM (TEE);  Surgeon: [name], MD;  Location: [facility];   Service: Open Heart Surgery;  Laterality: N/A;  . TOTAL HIP ARTHROPLASTY Left 09/17/2012   Procedure: Left TOTAL HIP ARTHROPLASTY ANTERIOR APPROACH;  Surgeon: Marybelle Killings, MD;  Location: [facility];  Service: Orthopedics;  Laterality: Left;   Social History   Occupational History  . 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 Smoking status: Former Smoker    Packs/day: 2.00    Years: 17.00    Pack years: 34.00    Types: Cigarettes    Last attempt to quit: 07/30/1984    Years since quitting: 34.2  . Smokeless tobacco: Never Used  Substance and Sexual Activity  . Alcohol use: Yes    Comment: 08/27/2017 "probably have had 6 beers in the last 6 months;  drink when I play golf"  . Drug use: No  . Sexual activity: Never

## 2018-11-07 ENCOUNTER — Other Ambulatory Visit: Payer: Self-pay | Admitting: Surgery

## 2018-11-08 ENCOUNTER — Other Ambulatory Visit: Payer: Self-pay | Admitting: Internal Medicine

## 2018-11-10 ENCOUNTER — Telehealth: Payer: Self-pay | Admitting: Internal Medicine

## 2018-11-10 NOTE — Telephone Encounter (Signed)
New message  [facility] for pt to call back about appt on 04.14.20 with Dr. Lovena Le. Will offer MyChart Visit to pt.

## 2018-11-11 ENCOUNTER — Encounter: Payer: Medicare HMO | Admitting: Internal Medicine

## 2018-11-11 NOTE — Telephone Encounter (Signed)
Follow up   Spoke with pt about his appt scheduled for today at 2:45. Pt did not want to do a virtual visit. He said he would wait until things have calmed down and come into the office. Informed patient we would not be doing office visits until August. Recall entered.

## 2018-12-02 ENCOUNTER — Other Ambulatory Visit: Payer: Self-pay | Admitting: Internal Medicine

## 2018-12-02 MED ORDER — ATORVASTATIN CALCIUM 80 MG PO TABS: 80.0000 mg | ORAL_TABLET | Freq: Every day | ORAL | 1 refills | Status: DC

## 2018-12-15 ENCOUNTER — Other Ambulatory Visit: Payer: Self-pay

## 2018-12-15 ENCOUNTER — Ambulatory Visit (INDEPENDENT_AMBULATORY_CARE_PROVIDER_SITE_OTHER): Payer: Medicare HMO

## 2018-12-15 DIAGNOSIS — Z95810 Presence of automatic (implantable) cardiac defibrillator: Secondary | ICD-10-CM

## 2018-12-15 DIAGNOSIS — I5022 Chronic systolic (congestive) heart failure: Secondary | ICD-10-CM | POA: Diagnosis not present

## 2018-12-16 ENCOUNTER — Telehealth: Payer: Self-pay

## 2018-12-16 NOTE — Telephone Encounter (Signed)
Spoke with patient to remind of missed remote transmission 

## 2018-12-17 ENCOUNTER — Other Ambulatory Visit: Payer: Self-pay

## 2018-12-17 ENCOUNTER — Ambulatory Visit (INDEPENDENT_AMBULATORY_CARE_PROVIDER_SITE_OTHER): Payer: Medicare HMO

## 2018-12-17 ENCOUNTER — Ambulatory Visit: Payer: Medicare HMO | Admitting: Family Medicine

## 2018-12-17 ENCOUNTER — Encounter: Payer: Self-pay | Admitting: Family Medicine

## 2018-12-17 DIAGNOSIS — M79645 Pain in left finger(s): Secondary | ICD-10-CM

## 2018-12-17 DIAGNOSIS — M79672 Pain in left foot: Secondary | ICD-10-CM

## 2018-12-17 MED ORDER — METHYLPREDNISOLONE 4 MG PO TBPK: ORAL_TABLET | ORAL | 0 refills | Status: DC

## 2018-12-17 NOTE — Progress Notes (Signed)
Office Visit Note   Patient: Randy [name]           Date of Birth: 1945-09-09           MRN: [id number] Visit Date: 12/17/2018 Requested by: Lucille Passy, MD [place], [place] [id number] PCP: Lucille Passy, MD  Subjective: Chief Complaint  Patient presents with  . Left Foot - Pain    Woke up 2 days ago with swelling, pain and redness in the foot. NKI    HPI: He is here with left foot pain.  2 days ago he woke up with severe pain, swelling and redness on the lateral aspect of his foot.  Hard to bear weight since then.  No injury, cannot think of anything that might have triggered this.  He continues to have stiffness in his left third finger although it does not hurt as much as he used to.              ROS: No fevers or chills.  All other systems were reviewed and are negative.  Objective: Vital Signs: There were no vitals taken for this visit.  Physical Exam:  General:  Alert and oriented, in no acute distress. Pulm:  Breathing unlabored. Psy:  Normal mood, congruent affect. Skin: No rash on his skin. Left hand: Third finger has nodularity at the DIP joint and stiffness, but is minimally tender and there is no warmth or erythema today. Left foot: Erythematous, warm to the touch and very tender over the lateral aspect of his foot near the cuboid.  There is some pain with eversion against resistance.  No drainage, no sign of cellulitis.  Imaging: X-rays left foot: First MTP bunion deformity with DJD.  Soft tissue calcification near the cuboid bone, no sign of stress fracture.  Posterior tibial artery is calcified.  X-rays left third finger: Moderate to severe DJD at the DIP joint.  Probable tophus on the radial side of the joint.  Assessment & Plan: 1.  Acute left foot pain, suspect gout attack. -Medrol Dosepak.  Follow-up as needed.  Watch blood sugars closely.  2.  Left third finger DIP stiffness secondary to DJD, probably due to gout. -Hand therapy  referral to try to restore some range of motion.     Procedures: No procedures performed  No notes on file     PMFS History: Patient Active Problem List   Diagnosis Date Noted  . Idiopathic gout of multiple sites 10/07/2018  . Grief 06/16/2018  . Dizziness 06/16/2018  . Allergic rhinitis 04/24/2018  . S/P CABG x 3 09/02/2017  . Leukocytosis 08/27/2017  . Visit for well man health check 05/07/2017  . COPD (chronic obstructive pulmonary disease) ([place]) 05/07/2017  . Chronic systolic CHF (congestive heart failure) ([place]) 10/18/2016  . Generalized OA 06/22/2014  . CKD (chronic kidney disease) stage 3, GFR 30-59 ml/min (HCC)   . Synovial cyst of lumbar spine 07/17/2013    Class: Diagnosis of  . Osteoarthritis of left hip 09/17/2012  . Automatic implantable cardioverter-defibrillator in situ - St Jude 05/21/2012  . Chronic systolic heart failure ([place]) 05/01/2011  . IMPOTENCE OF ORGANIC ORIGIN 02/20/2010  . Unspecified vitamin D deficiency 02/14/2010  . Type 2 diabetes with nephropathy ([place]) 01/11/2010  . ICD-St.Jude 05/10/2009  . HLD (hyperlipidemia) 02/24/2008  . Essential hypertension 02/24/2008  . Non-ST elevation (NSTEMI) myocardial infarction ([facility]) 02/24/2008  . HYPERSOMNIA UNSPECIFIED 02/24/2008   Past Medical History:  Diagnosis Date  . AICD (  automatic cardioverter/defibrillator) present 10/18/2016   "replaced 2009-St. Jude device, Dr Lovena Le"  . Anemia   . Arthritis    "back" (08/27/2017)  . CAD (coronary artery disease)   . CHF (congestive heart failure) ([facility])   . CKD (chronic kidney disease) stage 3, GFR 30-59 ml/min (HCC)    [name]  . Diverticulosis   . Dyslipidemia   . GERD (gastroesophageal reflux disease)   . Hypertension   . Neuromuscular disorder (HCC)    neuropathy- in hands  . Osteoarthritis    hips, hands  . Pacemaker   . Shortness of breath   . Sleep apnea    started a sleep study but didn't completed, states PCP told him he has sleep  apnea,  doesn't use CPAP  . Systolic dysfunction   . Type 2 diabetes with nephropathy ([place])   . Vitamin D deficiency     Family History  Problem Relation Age of Onset  . Heart attack Father 85  . Diabetes Brother     Past Surgical History:  Procedure Laterality Date  . ANTERIOR CERVICAL DECOMP/DISCECTOMY FUSION    . BACK SURGERY    . CARPAL TUNNEL RELEASE Left   . CORONARY ARTERY BYPASS GRAFT N/A 09/02/2017   Procedure: CORONARY ARTERY BYPASS GRAFTING (CABG) x three , using left internal mammary artery and right leg greater saphenous vein harvested endoscopically;  Surgeon: [name], MD;  Location: [facility];  Service: Open Heart Surgery;  Laterality: N/A;  . CYST EXCISION     "back"  . ICD IMPLANT N/A 10/18/2016   Procedure: ICD Implant- Upgrade to Dual Chamber;  Surgeon: Evans Lance, MD;  Location: [facility];  Service: Cardiovascular;  Laterality: N/A;  . INSERT / REPLACE / REMOVE PACEMAKER    . JOINT REPLACEMENT    . LEFT HEART CATH AND CORONARY ANGIOGRAPHY N/A 08/30/2017   Procedure: LEFT HEART CATH AND CORONARY ANGIOGRAPHY;  Surgeon: Martinique, Peter M, MD;  Location: [facility];  Service: Cardiovascular;  Laterality: N/A;  . LUMBAR LAMINECTOMY Left 07/17/2013   Procedure: MICRODISCECTOMY LUMBAR LAMINECTOMY;  Surgeon: Marybelle Killings, MD;  Location: [facility];  Service: Orthopedics;  Laterality: Left;  Left L4 Laminotomy, Lateral Recess Decompression, Cyst Excision  . SHOULDER OPEN ROTATOR CUFF REPAIR Left   . TEE WITHOUT CARDIOVERSION N/A 09/02/2017   Procedure: TRANSESOPHAGEAL ECHOCARDIOGRAM (TEE);  Surgeon: [name], MD;  Location: [facility];  Service: Open Heart Surgery;  Laterality: N/A;  . TOTAL HIP ARTHROPLASTY Left 09/17/2012   Procedure: Left TOTAL HIP ARTHROPLASTY ANTERIOR APPROACH;  Surgeon: Marybelle Killings, MD;  Location: [facility];  Service: Orthopedics;  Laterality: Left;   Social History   Occupational History  . 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 Smoking status: Former Smoker    Packs/day: 2.00    Years: 17.00    Pack years: 34.00    Types: Cigarettes    Last attempt to quit: 07/30/1984    Years since quitting: 34.4  . Smokeless tobacco: Never Used  Substance and Sexual Activity  . Alcohol use: Yes    Comment: 08/27/2017 "probably have had 6 beers in the last 6 months; drink when I play golf"  . Drug use: No  . Sexual activity: Never

## 2018-12-19 ENCOUNTER — Ambulatory Visit (INDEPENDENT_AMBULATORY_CARE_PROVIDER_SITE_OTHER): Payer: Medicare HMO | Admitting: *Deleted

## 2018-12-19 DIAGNOSIS — I255 Ischemic cardiomyopathy: Secondary | ICD-10-CM | POA: Diagnosis not present

## 2018-12-19 NOTE — Progress Notes (Signed)
EPIC Encounter for ICM Monitoring  Patient Name: Randy [name] is a 73 y.o. male Date: 12/19/2018 Primary Care Physican: Lucille Passy, MD Primary Cardiologist:Taylor Electrophysiologist:Taylor Last Weight: 198lbs baseline   Transmission reviewed.  Thoracic impedance normal.  Prescribed: Furosemide40 mg 1 tablet daily  Labs: 06/16/2018 Creatinine 2.36, BUN 47, Potassium 5.4, Sodium 141, GFR 28.93 04/04/2018 Creatinine 2.3 BUN 50, Potassium 5.0, Sodium 140 03/19/2018 Creatinine 2.35, BUN 39, Potassium 4.8, Sodium 138, GFR 27-31 02/19/2018 Creatinine2.24, BUN42, Potassium5.1, Sodium139, VUD31-43 01/06/2018 Creatinine2.09, BUN30, Potassium4.3, [id number], QAS60-15  11/08/2017 Creatinine1.70, BUN32, Potassium4.4, IFBPPH432, XMD47-09  09/07/2017 Creatinine1.79, BUN38, Potassium3.9, Sodium134, KHV74-73 A complete set of results can be found in Results Review.  Recommendations:None  Follow up plan: ICM clinic phone appointment on6/22/2020.   3 month ICM trend: 12/19/2018    1 Year ICM trend:       Rosalene Billings, RN 12/19/2018 10:55 AM

## 2018-12-20 LAB — CUP PACEART REMOTE DEVICE CHECK
Date Time Interrogation Session: 20200523062558
Implantable Lead Implant Date: 20091022
Implantable Lead Implant Date: 20180322
Implantable Lead Location: 753859
Implantable Lead Location: 753860
Implantable Lead Model: 7121
Implantable Pulse Generator Implant Date: 20180322
Pulse Gen Serial Number: 7413245

## 2018-12-27 ENCOUNTER — Telehealth: Payer: Self-pay

## 2018-12-27 ENCOUNTER — Other Ambulatory Visit: Payer: Self-pay | Admitting: Family Medicine

## 2018-12-27 DIAGNOSIS — Z20822 Contact with and (suspected) exposure to covid-19: Secondary | ICD-10-CM

## 2018-12-27 NOTE — Telephone Encounter (Signed)
Attempted to contact pt.  Left voice message to call nurse @ [phone], to discuss recent Ortho Care visit.

## 2018-12-27 NOTE — Addendum Note (Signed)
Addended by: Denman George on: 12/27/2018 03:01 PM   Modules accepted: Orders

## 2018-12-27 NOTE — Telephone Encounter (Signed)
Pt. Called back.  Advised of poss. Exposure to COVID 19, when in [employer] office recently, by an employee that tested positive.  Offered appt. for COVID 19 screening.  Appt. scheduled at 9:30 AM, 12/29/18, at the [facility] Testing Site.  Agreed with plan.

## 2018-12-29 ENCOUNTER — Other Ambulatory Visit: Payer: Medicare HMO

## 2018-12-29 DIAGNOSIS — Z20822 Contact with and (suspected) exposure to covid-19: Secondary | ICD-10-CM

## 2018-12-30 ENCOUNTER — Encounter: Payer: Self-pay | Admitting: Cardiology

## 2018-12-30 LAB — NOVEL CORONAVIRUS, NAA: SARS-CoV-2, NAA: NOT DETECTED

## 2018-12-30 NOTE — Progress Notes (Signed)
Remote ICD transmission.   

## 2018-12-31 ENCOUNTER — Telehealth (INDEPENDENT_AMBULATORY_CARE_PROVIDER_SITE_OTHER): Payer: Self-pay | Admitting: Family Medicine

## 2018-12-31 NOTE — Telephone Encounter (Signed)
Pt returned call and lab message given to him, showed the test result for covid-19, not detected. Pt voiced understanding

## 2019-01-01 ENCOUNTER — Other Ambulatory Visit: Payer: Self-pay | Admitting: Internal Medicine

## 2019-01-02 ENCOUNTER — Other Ambulatory Visit: Payer: Self-pay | Admitting: Family Medicine

## 2019-01-16 ENCOUNTER — Other Ambulatory Visit: Payer: Self-pay

## 2019-01-16 ENCOUNTER — Encounter: Payer: Self-pay | Admitting: Family Medicine

## 2019-01-16 ENCOUNTER — Ambulatory Visit (INDEPENDENT_AMBULATORY_CARE_PROVIDER_SITE_OTHER): Payer: Medicare HMO | Admitting: Family Medicine

## 2019-01-16 DIAGNOSIS — M79645 Pain in left finger(s): Secondary | ICD-10-CM

## 2019-01-16 MED ORDER — METHYLPREDNISOLONE 4 MG PO TBPK: ORAL_TABLET | ORAL | 1 refills | Status: DC

## 2019-01-16 NOTE — Progress Notes (Signed)
Office Visit Note   Patient: Randy [name]           Date of Birth: 1946-02-17           MRN: [id number] Visit Date: 01/16/2019 Requested by: Lucille Passy, MD [place],  [place] [id number] PCP: Lucille Passy, MD  Subjective: Chief Complaint  Patient presents with  . Right Middle Finger - Pain, Follow-up    Finger is still swollen and painful. Cannot pick up objects with that hand because he cannot grip them well. Did not go to occupational therapy.    HPI: He is here with persistent left third finger pain and stiffness.  He cannot flex his finger at the PIP or DIP joints like he used to.  He is very frustrated by his ongoing troubles.  No further drainage.              ROS: No fever or chills.  All other systems were reviewed and are negative.  Objective: Vital Signs: There were no vitals taken for this visit.  Physical Exam:  General:  Alert and oriented, in no acute distress. Pulm:  Breathing unlabored. Psy:  Normal mood, congruent affect. Skin: No skin breakdown.  No warmth, but some erythema at the DIP joint of the left third finger. Left hand: Third finger has a flexion contracture at the PIP joint of about 5 degrees.  Mildly tender to palpation on the dorsum of the joint.  Flexion is limited to about 45 degrees.  DIP joint is very stiff.  Imaging: Limited ultrasound left third finger shows no effusion in the PIP or DIP joints.  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  Assessment & Plan: 1.  Persistent left third finger PIP and DIP pain and stiffness, probably related to gout with tophus formation -Discussed with patient and elected to inject each joint with 20 mg methylprednisolone.  If stiffness has not improved within a week he will call me and we will order hand therapy referral.     Procedures: Ultrasound-guided left third finger injection: After sterile prep with Betadine, injected 1/2 cc 1% lidocaine without epinephrine and 20 mg  methylprednisolone into the DIP and PIP joints under indirect ultrasound guidance.    PMFS History: Patient Active Problem List   Diagnosis Date Noted  . Idiopathic gout of multiple sites 10/07/2018  . Grief 06/16/2018  . Dizziness 06/16/2018  . Allergic rhinitis 04/24/2018  . S/P CABG x 3 09/02/2017  . Leukocytosis 08/27/2017  . Visit for well man health check 05/07/2017  . COPD (chronic obstructive pulmonary disease) ([place]) 05/07/2017  . Chronic systolic CHF (congestive heart failure) ([place]) 10/18/2016  . Generalized OA 06/22/2014  . CKD (chronic kidney disease) stage 3, GFR 30-59 ml/min (HCC)   . Synovial cyst of lumbar spine 07/17/2013    Class: Diagnosis of  . Osteoarthritis of left hip 09/17/2012  . Automatic implantable cardioverter-defibrillator in situ - St Jude 05/21/2012  . Chronic systolic heart failure ([place]) 05/01/2011  . IMPOTENCE OF ORGANIC ORIGIN 02/20/2010  . Unspecified vitamin D deficiency 02/14/2010  . Type 2 diabetes with nephropathy ([place]) 01/11/2010  . ICD-St.Jude 05/10/2009  . HLD (hyperlipidemia) 02/24/2008  . Essential hypertension 02/24/2008  . Non-ST elevation (NSTEMI) myocardial infarction ([place]) 02/24/2008  . HYPERSOMNIA UNSPECIFIED 02/24/2008   Past Medical History:  Diagnosis Date  . AICD (automatic cardioverter/defibrillator) present 10/18/2016   "replaced 2009-St. Jude device, Dr Lovena Le"  . Anemia   . Arthritis    "  back" (08/27/2017)  . CAD (coronary artery disease)   . CHF (congestive heart failure) ([place])   . CKD (chronic kidney disease) stage 3, GFR 30-59 ml/min (HCC)    [name]  . Diverticulosis   . Dyslipidemia   . GERD (gastroesophageal reflux disease)   . Hypertension   . Neuromuscular disorder (HCC)    neuropathy- in hands  . Osteoarthritis    hips, hands  . Pacemaker   . Shortness of breath   . Sleep apnea    started a sleep study but didn't completed, states PCP told him he has sleep  apnea, doesn't use CPAP  . Systolic  dysfunction   . Type 2 diabetes with nephropathy ([place])   . Vitamin D deficiency     Family History  Problem Relation Age of Onset  . Heart attack Father 66  . Diabetes Brother     Past Surgical History:  Procedure Laterality Date  . ANTERIOR CERVICAL DECOMP/DISCECTOMY FUSION    . BACK SURGERY    . CARPAL TUNNEL RELEASE Left   . CORONARY ARTERY BYPASS GRAFT N/A 09/02/2017   Procedure: CORONARY ARTERY BYPASS GRAFTING (CABG) x three , using left internal mammary artery and right leg greater saphenous vein harvested endoscopically;  Surgeon: [name], MD;  Location: [facility];  Service: Open Heart Surgery;  Laterality: N/A;  . CYST EXCISION     "back"  . ICD IMPLANT N/A 10/18/2016   Procedure: ICD Implant- Upgrade to Dual Chamber;  Surgeon: Evans Lance, MD;  Location: [facility];  Service: Cardiovascular;  Laterality: N/A;  . INSERT / REPLACE / REMOVE PACEMAKER    . JOINT REPLACEMENT    . LEFT HEART CATH AND CORONARY ANGIOGRAPHY N/A 08/30/2017   Procedure: LEFT HEART CATH AND CORONARY ANGIOGRAPHY;  Surgeon: Martinique, Peter M, MD;  Location: [facility];  Service: Cardiovascular;  Laterality: N/A;  . LUMBAR LAMINECTOMY Left 07/17/2013   Procedure: MICRODISCECTOMY LUMBAR LAMINECTOMY;  Surgeon: Marybelle Killings, MD;  Location: [facility];  Service: Orthopedics;  Laterality: Left;  Left L4 Laminotomy, Lateral Recess Decompression, Cyst Excision  . SHOULDER OPEN ROTATOR CUFF REPAIR Left   . TEE WITHOUT CARDIOVERSION N/A 09/02/2017   Procedure: TRANSESOPHAGEAL ECHOCARDIOGRAM (TEE);  Surgeon: [name], MD;  Location: [facility];  Service: Open Heart Surgery;  Laterality: N/A;  . TOTAL HIP ARTHROPLASTY Left 09/17/2012   Procedure: Left TOTAL HIP ARTHROPLASTY ANTERIOR APPROACH;  Surgeon: Marybelle Killings, MD;  Location: [facility];  Service: Orthopedics;  Laterality: Left;   Social History   Occupational History  . 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 Smoking status: Former Smoker    Packs/day: 2.00    Years: 17.00    Pack years: 34.00    Types: Cigarettes    Quit date: 07/30/1984    Years since quitting: 34.4  . Smokeless tobacco: Never Used  Substance and Sexual Activity  . Alcohol use: Yes    Comment: 08/27/2017 "probably have had 6 beers in the last 6 months; drink when I play golf"  . Drug use: No  . Sexual activity: Never

## 2019-01-16 NOTE — Addendum Note (Signed)
Addended by: Hortencia Pilar on: 01/16/2019 02:36 PM   Modules accepted: Orders

## 2019-01-19 ENCOUNTER — Ambulatory Visit (INDEPENDENT_AMBULATORY_CARE_PROVIDER_SITE_OTHER): Payer: Medicare HMO

## 2019-01-19 ENCOUNTER — Telehealth: Payer: Self-pay

## 2019-01-19 DIAGNOSIS — Z95810 Presence of automatic (implantable) cardiac defibrillator: Secondary | ICD-10-CM

## 2019-01-19 DIAGNOSIS — I5022 Chronic systolic (congestive) heart failure: Secondary | ICD-10-CM | POA: Diagnosis not present

## 2019-01-19 NOTE — Telephone Encounter (Signed)
Left message for patient to remind of missed remote transmission.  

## 2019-01-20 NOTE — Progress Notes (Signed)
EPIC Encounter for ICM Monitoring  Patient Name: KAINE MCQUILLEN is a 73 y.o. male Date: 01/20/2019 Primary Care Physican: Lucille Passy, MD Primary Cardiologist:Taylor Electrophysiologist:Taylor Last Weight: 198lbs baseline   Attempted call to patient and unable to reach.   Transmission reviewed.   Corvue thoracic impedanceabnormal suggesting possible fluid accumulation since 01/15/2019.  Prescribed: Furosemide20 mg 1 tablet daily  Labs: 06/16/2018 Creatinine 2.36, BUN 47, Potassium 5.4, Sodium 141, GFR 28.93 04/04/2018 Creatinine 2.3 BUN 50, Potassium 5.0, Sodium 140 A complete set of results can be found in Results Review.  Recommendations:Unable to reach.    Follow up plan: ICM clinic phone appointment on6/30/2020 to recheck fluid levels.  Copy of ICM check sent to Dr. Lovena Le for review and recommendations if needed.   3 month ICM trend: 01/20/2019    1 Year ICM trend:       Rosalene Billings, RN 01/20/2019 10:46 AM

## 2019-01-29 ENCOUNTER — Other Ambulatory Visit: Payer: Self-pay | Admitting: Internal Medicine

## 2019-02-02 NOTE — Progress Notes (Signed)
No ICM remote transmission received for 01/27/2019 and next ICM transmission scheduled for 02/25/2019.

## 2019-02-05 ENCOUNTER — Other Ambulatory Visit: Payer: Self-pay | Admitting: Internal Medicine

## 2019-02-05 MED ORDER — ENTRESTO 24-26 MG PO TABS: 1.0000 | ORAL_TABLET | Freq: Two times a day (BID) | ORAL | 0 refills | Status: DC

## 2019-02-10 ENCOUNTER — Telehealth: Payer: Self-pay | Admitting: Internal Medicine

## 2019-02-10 NOTE — Telephone Encounter (Signed)
New message   Pt c/o medication issue:  1. Name of Medication: sacubitril-valsartan (ENTRESTO) 24-26 MG  2. How are you currently taking this medication (dosage and times per day)? Twice daily  3. Are you having a reaction (difficulty breathing--STAT)?no   4. What is your medication issue? Patient states that his medication is expensive wants to see if can get a substitute for the medication. Please call.

## 2019-02-11 NOTE — Telephone Encounter (Signed)
I called pt to give him some information for Entresto Pt Asst. He did not answer home or cell number. I left a message on his home phone.  I advised him to call [employer] Pt Asst at [phone] and explain to them his situation. I informed him that they would mail him an application and for him to fill out his portion and bring that along with any documention needed and we would complete the provider portion and get it faxed in for him.

## 2019-02-25 ENCOUNTER — Ambulatory Visit (INDEPENDENT_AMBULATORY_CARE_PROVIDER_SITE_OTHER): Payer: Medicare HMO

## 2019-02-25 DIAGNOSIS — I5022 Chronic systolic (congestive) heart failure: Secondary | ICD-10-CM

## 2019-02-25 DIAGNOSIS — Z95810 Presence of automatic (implantable) cardiac defibrillator: Secondary | ICD-10-CM

## 2019-02-27 NOTE — Progress Notes (Signed)
EPIC Encounter for ICM Monitoring  Patient Name: Randy [name] is a 73 y.o. male Date: 02/27/2019 Primary Care Physican: Lucille Passy, MD Primary Cardiologist:Taylor Electrophysiologist:Taylor Last Weight: 198lbs baseline   Transmission reviewed.   Corvue thoracic impedanceabnormal suggesting possible fluid accumulation since 01/15/2019.  Prescribed: Furosemide40 mg 1 tablet daily  Labs: 06/16/2018 Creatinine 2.36, BUN 47, Potassium 5.4, Sodium 141, GFR 28.93 04/04/2018 Creatinine 2.3 BUN 50, Potassium 5.0, Sodium 140 A complete set of results can be found in Results Review.  Recommendations:None    Follow up plan: ICM clinic phone appointment on8/20/2020.  Copy of ICM check sent to Dr. Lovena Le  Direct Trend Viewer 02/23/2019      Rosalene Billings, RN 02/27/2019 2:53 PM

## 2019-03-05 NOTE — Telephone Encounter (Signed)
Call placed to pt.  Pt had not followed up with [employer] yet.  He had filled his Entresto prescription.  Advised pt to call [employer] and tell them he needs assistance affording his Entresto.  Pt states he will.  Advised he will get some paperwork and to fill out his portion and then bring the rest to me.  Pt indicates understanding.

## 2019-03-20 ENCOUNTER — Telehealth: Payer: Self-pay

## 2019-03-20 ENCOUNTER — Encounter: Payer: Medicare HMO | Admitting: *Deleted

## 2019-03-20 NOTE — Telephone Encounter (Signed)
Spoke with patient to remind of missed remote transmission 

## 2019-03-20 NOTE — Progress Notes (Signed)
No ICM remote transmission received for 03/20/2019 and next ICM transmission scheduled for 03/31/2019.

## 2019-03-21 ENCOUNTER — Other Ambulatory Visit: Payer: Self-pay | Admitting: Family Medicine

## 2019-03-23 ENCOUNTER — Other Ambulatory Visit: Payer: Self-pay | Admitting: Family Medicine

## 2019-03-23 MED ORDER — GABAPENTIN 300 MG PO CAPS: 600.0000 mg | ORAL_CAPSULE | Freq: Two times a day (BID) | ORAL | 2 refills | Status: DC

## 2019-03-23 NOTE — Telephone Encounter (Signed)
Medication Refill - Medication: gabapentin (NEURONTIN) 300 MG capsule    Has the patient contacted their pharmacy? Yes.   (Agent: If no, request that the patient contact the pharmacy for the refill.) (Agent: If yes, when and what did the pharmacy advise?)  Preferred Pharmacy (with phone number or street name): [facility]  Agent: Please be advised that RX refills may take up to 3 business days. We ask that you follow-up with your pharmacy.

## 2019-03-27 ENCOUNTER — Emergency Department (HOSPITAL_COMMUNITY): Payer: Medicare HMO

## 2019-03-27 ENCOUNTER — Encounter (HOSPITAL_COMMUNITY): Payer: Self-pay | Admitting: Emergency Medicine

## 2019-03-27 ENCOUNTER — Telehealth: Payer: Self-pay

## 2019-03-27 ENCOUNTER — Other Ambulatory Visit: Payer: Self-pay

## 2019-03-27 ENCOUNTER — Encounter: Payer: Self-pay | Admitting: Cardiology

## 2019-03-27 ENCOUNTER — Emergency Department (HOSPITAL_COMMUNITY)
Admission: EM | Admit: 2019-03-27 | Discharge: 2019-03-27 | Disposition: A | Discharge status: Home / Self Care | Payer: Medicare HMO | Attending: Emergency Medicine | Admitting: Emergency Medicine

## 2019-03-27 DIAGNOSIS — Z79899 Other long term (current) drug therapy: Secondary | ICD-10-CM | POA: Diagnosis not present

## 2019-03-27 DIAGNOSIS — Z7984 Long term (current) use of oral hypoglycemic drugs: Secondary | ICD-10-CM | POA: Insufficient documentation

## 2019-03-27 DIAGNOSIS — I130 Hypertensive heart and chronic kidney disease with heart failure and stage 1 through stage 4 chronic kidney disease, or unspecified chronic kidney disease: Secondary | ICD-10-CM | POA: Diagnosis not present

## 2019-03-27 DIAGNOSIS — Z7982 Long term (current) use of aspirin: Secondary | ICD-10-CM | POA: Diagnosis not present

## 2019-03-27 DIAGNOSIS — Z951 Presence of aortocoronary bypass graft: Secondary | ICD-10-CM | POA: Diagnosis not present

## 2019-03-27 DIAGNOSIS — N183 Chronic kidney disease, stage 3 (moderate): Secondary | ICD-10-CM | POA: Diagnosis not present

## 2019-03-27 DIAGNOSIS — M19022 Primary osteoarthritis, left elbow: Secondary | ICD-10-CM

## 2019-03-27 DIAGNOSIS — I2510 Atherosclerotic heart disease of native coronary artery without angina pectoris: Secondary | ICD-10-CM | POA: Insufficient documentation

## 2019-03-27 DIAGNOSIS — I509 Heart failure, unspecified: Secondary | ICD-10-CM | POA: Insufficient documentation

## 2019-03-27 DIAGNOSIS — M25522 Pain in left elbow: Secondary | ICD-10-CM | POA: Diagnosis present

## 2019-03-27 DIAGNOSIS — E1122 Type 2 diabetes mellitus with diabetic chronic kidney disease: Secondary | ICD-10-CM | POA: Diagnosis not present

## 2019-03-27 MED ORDER — HYDROCODONE-ACETAMINOPHEN 5-325 MG PO TABS
1.0000 | ORAL_TABLET | Freq: Once | ORAL | Status: AC
  Administered 2019-03-27: 1 via ORAL
  Filled 2019-03-27: qty 1

## 2019-03-27 MED ORDER — HYDROCODONE-ACETAMINOPHEN 5-325 MG PO TABS: 1.0000 | ORAL_TABLET | Freq: Four times a day (QID) | ORAL | 0 refills | Status: DC | PRN

## 2019-03-27 NOTE — Discharge Instructions (Signed)
Your x-ray today showed some mild degenerative changes. This could be the beginning of gout.  It is important for you to follow-up with your primary care provider in the next 1 to 2 days. Return to the ED if you start to have a red hot or swollen joint, develop a fever, changes to sensation of your arms.

## 2019-03-27 NOTE — ED Triage Notes (Signed)
Onset last night 2100 developed left elbow pain radiating to left hand. States pain currently 10/10 achy sharp dull pain.

## 2019-03-27 NOTE — ED Notes (Signed)
Patient transported to X-ray 

## 2019-03-27 NOTE — ED Provider Notes (Signed)
[facility] EMERGENCY DEPARTMENT Provider Note   CSN: [id number] Arrival date & time: 03/27/19  2130     History   Chief Complaint Chief Complaint  Patient presents with   Extremity Pain    HPI Randy [name] is a 74 y.o. male with a past medical history of gout, osteoarthritis, neuropathy, CKD who presents to ED for 13-hour history of nondominant left elbow pain.  Describes the pain as aching, worse with movement and palpation.  Denies history of similar symptoms in the past.  States that he took 2 aspirin with no improvement in his symptoms.  States that the pain begins in his left elbow and has caused pain with using his left hand.  He states that he has arthritis in both of his hands but this feels significantly worse.  Denies any injury to the area, history of septic joint, fever, chest pain, shoulder pain, change sensation.     HPI  Past Medical History:  Diagnosis Date   AICD (automatic cardioverter/defibrillator) present 10/18/2016   "replaced 2009-St. Jude device, Dr Lovena Le"   Anemia    Arthritis    "back" (08/27/2017)   CAD (coronary artery disease)    CHF (congestive heart failure) (HCC)    CKD (chronic kidney disease) stage 3, GFR 30-59 ml/min (HCC)    [name]   Diverticulosis    Dyslipidemia    GERD (gastroesophageal reflux disease)    Hypertension    Neuromuscular disorder (HCC)    neuropathy- in hands   Osteoarthritis    hips, hands   Pacemaker    Shortness of breath    Sleep apnea    started a sleep study but didn't completed, states PCP told him he has sleep  apnea, doesn't use CPAP   Systolic dysfunction    Type 2 diabetes with nephropathy ([place])    Vitamin D deficiency     Patient Active Problem List   Diagnosis Date Noted   Idiopathic gout of multiple sites 10/07/2018   Grief 06/16/2018   Dizziness 06/16/2018   Allergic rhinitis 04/24/2018   S/P CABG x 3 09/02/2017   Leukocytosis 08/27/2017    Visit for well man health check 05/07/2017   COPD (chronic obstructive pulmonary disease) ([place]) 86/57/8469   Chronic systolic CHF (congestive heart failure) ([place]) 10/18/2016   Generalized OA 06/22/2014   CKD (chronic kidney disease) stage 3, GFR 30-59 ml/min (HCC)    Synovial cyst of lumbar spine 07/17/2013    Class: Diagnosis of   Osteoarthritis of left hip 09/17/2012   Automatic implantable cardioverter-defibrillator in situ - St Jude 62/95/2841   Chronic systolic heart failure ([place]) 05/01/2011   IMPOTENCE OF ORGANIC ORIGIN 02/20/2010   Unspecified vitamin D deficiency 02/14/2010   Type 2 diabetes with nephropathy ([place]) 01/11/2010   ICD-St.Jude 05/10/2009   HLD (hyperlipidemia) 02/24/2008   Essential hypertension 02/24/2008   Non-ST elevation (NSTEMI) myocardial infarction ([place]) 02/24/2008   HYPERSOMNIA UNSPECIFIED 02/24/2008    Past Surgical History:  Procedure Laterality Date   ANTERIOR CERVICAL DECOMP/DISCECTOMY FUSION     BACK SURGERY     CARPAL TUNNEL RELEASE Left    CORONARY ARTERY BYPASS GRAFT N/A 09/02/2017   Procedure: CORONARY ARTERY BYPASS GRAFTING (CABG) x three , using left internal mammary artery and right leg greater saphenous vein harvested endoscopically;  Surgeon: [name], MD;  Location: [facility];  Service: Open Heart Surgery;  Laterality: N/A;   CYST EXCISION     "back"   ICD IMPLANT N/A  10/18/2016   Procedure: ICD Implant- Upgrade to Dual Chamber;  Surgeon: Evans Lance, MD;  Location: [facility];  Service: Cardiovascular;  Laterality: N/A;   INSERT / REPLACE / REMOVE PACEMAKER     JOINT REPLACEMENT     LEFT HEART CATH AND CORONARY ANGIOGRAPHY N/A 08/30/2017   Procedure: LEFT HEART CATH AND CORONARY ANGIOGRAPHY;  Surgeon: Martinique, Peter M, MD;  Location: [facility];  Service: Cardiovascular;  Laterality: N/A;   LUMBAR LAMINECTOMY Left 07/17/2013   Procedure: MICRODISCECTOMY LUMBAR LAMINECTOMY;  Surgeon: Marybelle Killings,  MD;  Location: [facility];  Service: Orthopedics;  Laterality: Left;  Left L4 Laminotomy, Lateral Recess Decompression, Cyst Excision   SHOULDER OPEN ROTATOR CUFF REPAIR Left    TEE WITHOUT CARDIOVERSION N/A 09/02/2017   Procedure: TRANSESOPHAGEAL ECHOCARDIOGRAM (TEE);  Surgeon: [name], MD;  Location: [facility];  Service: Open Heart Surgery;  Laterality: N/A;   TOTAL HIP ARTHROPLASTY Left 09/17/2012   Procedure: Left TOTAL HIP ARTHROPLASTY ANTERIOR APPROACH;  Surgeon: Marybelle Killings, MD;  Location: [facility];  Service: Orthopedics;  Laterality: Left;        Home Medications    Prior to Admission medications   Medication Sig Start Date End Date Taking? Authorizing Provider  Accu-Chek Softclix Lancets lancets USE AS DIRECTED  TO TEST BLOOD SUGAR ONE TIME DAILY  AND AS NEEDED 01/05/19   Lucille Passy, MD  Alcohol Swabs (B-D SINGLE USE SWABS REGULAR) PADS Use as instructed to clean area for glucose monitoring once daily and as needed.  Diagnosis:  E11.21  Non insulin-dependent 07/24/16   Tonia Ghent, MD  amiodarone (PACERONE) 200 MG tablet Take 200 mg by mouth daily. 10/12/18   [provider]  aspirin EC 325 MG tablet Take 1 tablet (325 mg total) by mouth daily. 09/08/17   Nani Skillern, PA-C  atorvastatin (LIPITOR) 80 MG tablet Take 1 tablet (80 mg total) by mouth daily at 6 PM. 12/02/18   Evans Lance, MD  Blood Glucose Calibration (ACCU-CHEK AVIVA) SOLN Use to calibrate blood glucose machine as recommended.  Diagnosis:  E11.21  Non-insulin dependent. 07/24/16   Tonia Ghent, MD  Blood Glucose Monitoring Suppl (ACCU-CHEK AVIVA PLUS) w/Device KIT USE TO CHECK BLOOD SUGAR ONCE DAILY. NEED MD APPOINTMENT 05/14/18   Lucille Passy, MD  carvedilol (COREG) 3.125 MG tablet TAKE 1 TABLET BY MOUTH 2 TIMES DAILY. 11/10/18   Evans Lance, MD  finasteride (PROSCAR) 5 MG tablet TAKE 1 TABLET EVERY DAY 12/29/18   Lucille Passy, MD  furosemide (LASIX) 20 MG tablet TAKE 1 TABLET BY MOUTH EVERY  DAY 11/10/18   Evans Lance, MD  furosemide (LASIX) 40 MG tablet Take 1 tablet (40 mg total) by mouth daily. 05/29/18 05/24/19  Evans Lance, MD  gabapentin (NEURONTIN) 300 MG capsule Take 2 capsules (600 mg total) by mouth 2 (two) times daily. 03/23/19   Lucille Passy, MD  glipiZIDE (GLUCOTROL) 5 MG tablet TAKE 1 TABLET EVERY DAY BEFORE BREAKFAST 03/23/19   Lucille Passy, MD  glucose blood (ACCU-CHEK AVIVA PLUS) test strip Test Blood Sugar 1 time daily: Dx:E11.27 07/31/17   Lucille Passy, MD  HYDROcodone-acetaminophen (NORCO/VICODIN) 5-325 MG tablet Take 1 tablet by mouth every 6 (six) hours as needed. 03/27/19   Andrw Mcguirt, PA-C  methylPREDNISolone (MEDROL DOSEPAK) 4 MG TBPK tablet As directed for 6 days. 01/16/19   Hilts, Legrand Como, MD  sacubitril-valsartan (ENTRESTO) 24-26 MG Take  1 tablet by mouth 2 (two) times daily. Please make yearly appt with Dr. Lovena Le for October for future refills. 1st attempt 02/05/19   Evans Lance, MD  tamsulosin [facility]) 0.4 MG CAPS capsule TAKE 1 CAPSULE EVERY DAY 07/09/18   Lucille Passy, MD  Tiotropium Bromide-Olodaterol (STIOLTO RESPIMAT) 2.5-2.5 MCG/ACT AERS Inhale 2 puffs into the lungs daily. 06/11/18   Collene Gobble, MD  traMADol (ULTRAM) 50 MG tablet Take 1 tablet (50 mg total) by mouth every 6 (six) hours as needed for moderate pain. 09/08/17   Nani Skillern, PA-C    Family History Family History  Problem Relation Age of Onset   Heart attack Father 56   Diabetes Brother     Social History Social History   Tobacco Use   Smoking status: Former Smoker    Packs/day: 2.00    Years: 17.00    Pack years: 34.00    Types: Cigarettes    Quit date: 07/30/1984    Years since quitting: 34.6   Smokeless tobacco: Never Used  Substance Use Topics   Alcohol use: Yes    Comment: 08/27/2017 "probably have had 6 beers in the last 6 months; drink when I play golf"   Drug use: No     Allergies   Patient has no known allergies.   Review of  Systems Review of Systems  Constitutional: Negative for chills and fever.  Musculoskeletal: Positive for arthralgias. Negative for joint swelling and myalgias.  Skin: Negative for wound.  Neurological: Positive for weakness. Negative for numbness.     Physical Exam Updated Vital Signs BP (!) 149/72    Pulse 76    Temp 98.6 F (37 C) (Oral)    Resp 17    Ht 5' 7.5" (1.715 m)    Wt 93 kg    SpO2 94%    BMI 31.63 kg/m   Physical Exam Vitals signs and nursing note reviewed.  Constitutional:      General: He is not in acute distress.    Appearance: He is well-developed. He is not diaphoretic.  HENT:     Head: Normocephalic and atraumatic.  Eyes:     General: No scleral icterus.    Conjunctiva/sclera: Conjunctivae normal.  Neck:     Musculoskeletal: Normal range of motion.  Pulmonary:     Effort: Pulmonary effort is normal. No respiratory distress.  Musculoskeletal:        General: Tenderness present.     Comments: Tenderness to palpation of the medial and lateral condyles of the left elbow without overlying skin changes noted.  No edema, erythema or warmth of joint noted.  Pain with full extension of the joint although able to perform.  Sensation intact to light touch of bilateral upper extremities.  Strength 4/5 with grip strength of left extremity.  2+ radial pulse palpated bilaterally.  Skin:    Findings: No rash.  Neurological:     General: No focal deficit present.     Mental Status: He is alert.     Cranial Nerves: No cranial nerve deficit.     Sensory: No sensory deficit.      ED Treatments / Results  Labs (all labs ordered are listed, but only abnormal results are displayed) Labs Reviewed - No data to display  EKG None  Radiology Dg Elbow Complete Left  Result Date: 03/27/2019 CLINICAL DATA:  Posterior left elbow pain limited range of motion. No known injury. EXAM: LEFT ELBOW - COMPLETE 3+ VIEW COMPARISON:  None. FINDINGS: No acute bony or joint abnormality is  identified. Mild degenerative change is present about the elbow. No joint effusion is identified. Soft tissues appear normal. IMPRESSION: No acute finding. Mild degenerative disease. Electronically Signed   By: Inge Rise M.D.   On: 03/27/2019 12:29    Procedures Procedures (including critical care time)  Medications Ordered in ED Medications  HYDROcodone-acetaminophen (NORCO/VICODIN) 5-325 MG per tablet 1 tablet (has no administration in time range)     Initial Impression / Assessment and Plan / ED Course  I have reviewed the triage vital signs and the nursing notes.  Pertinent labs & imaging results that were available during my care of the patient were reviewed by me and considered in my medical decision making (see chart for details).        73 year old male presents to ED for left elbow pain for the past 14 hours.  Reports sharp pain causing pain with extension and moving his left hand.  Reports history of osteoarthritis in bilateral hands, history of gout in his feet but is unsure if this feels similar.  No improvement noted with 2 aspirin.  On my exam there is tenderness palpation of the left elbow without overlying skin changes, edema, warmth or erythema of joint.  Area is neurovascularly intact.  Somewhat decreased grip strength on left side but patient states it is secondary to pain, not weakness.  X-ray shows mild degenerative changes.  Unsure if this could be the cause of his pain, could very well be early gout or other acute process.  I do not feel that this is a septic joint as he is able to perform range of motion secondary to pain there are no overlying skin changes.  Doubt neurological cause of symptoms as he has no changes to sensation and neuro exam is otherwise unremarkable.  Will give short course of pain medication and have him follow-up with PCP in the next 1 to 2 days as this could progress.  Patient agreeable to the plan.  Advised to return to ED for worsening  symptoms. Patient discussed with and seen by my attending, Dr. Regenia Skeeter.  Patient is hemodynamically stable, in NAD, and able to ambulate in the ED. Evaluation does not show pathology that would require ongoing emergent intervention or inpatient treatment. I explained the diagnosis to the patient. Pain has been managed and has no complaints prior to discharge. Patient is comfortable with above plan and is stable for discharge at this time. All questions were answered prior to disposition. Strict return precautions for returning to the ED were discussed. Encouraged follow up with PCP.   An After Visit Summary was printed and given to the patient.   Portions of this note were generated with [employer]. Dictation errors may occur despite best attempts at proofreading.   Final Clinical Impressions(s) / ED Diagnoses   Final diagnoses:  Primary osteoarthritis of left elbow    ED Discharge Orders         Ordered    HYDROcodone-acetaminophen (NORCO/VICODIN) 5-325 MG tablet  Every 6 hours PRN     03/27/19 1240           Delia Heady, PA-C 03/27/19 1243    Sherwood Gambler, MD 03/27/19 1652

## 2019-03-27 NOTE — Telephone Encounter (Signed)
I called pt to get him to come in for an appointment but he did not answer. I did leave a message on his voicemail.

## 2019-03-27 NOTE — ED Notes (Signed)
Pt dc'd home with all belongings, driven home by family

## 2019-03-27 NOTE — Telephone Encounter (Signed)
Needs DC appointment per [employer] rep due to RF communication issue with Merlin monitor. [employer] rep will need to be present.

## 2019-03-30 ENCOUNTER — Other Ambulatory Visit: Payer: Self-pay | Admitting: Family Medicine

## 2019-03-30 ENCOUNTER — Encounter: Payer: Self-pay | Admitting: Family Medicine

## 2019-03-30 ENCOUNTER — Ambulatory Visit (INDEPENDENT_AMBULATORY_CARE_PROVIDER_SITE_OTHER): Payer: Medicare HMO | Admitting: Family Medicine

## 2019-03-30 DIAGNOSIS — M10022 Idiopathic gout, left elbow: Secondary | ICD-10-CM

## 2019-03-30 MED ORDER — METHYLPREDNISOLONE 4 MG PO TBPK: ORAL_TABLET | ORAL | 0 refills | Status: DC

## 2019-03-30 NOTE — Progress Notes (Signed)
Randy [name] - 73 y.o. male MRN [id number]  Date of birth: 1945/09/15  Office Visit Note: Visit Date: 03/30/2019 PCP: Lucille Passy, MD Referred by: Lucille Passy, MD  Subjective: Chief Complaint  Patient presents with  . Left Elbow - Pain  . Elbow Pain    No injury. Pain started on Thursday, seen at ED on 03/27/19 with xrays. Follow up today, Constant ache, difficulty bring fork or spoon up to mouth.    HPI: Randy [name] is a 73 y.o. male who comes in today with acute left elbow pain. His pain started Thursday evening with sharp, throbbing pain in his elbow that was so bad he cried. He presented to the ED on Friday morning because he was concerned the pain could be cardiac related with his history. They obtained x-rays of elbow that showed mild-moderate OA but no acute fracture. Provided 5 pills of norco-vicodin which helped pain. He reports that his pain has improved by 40% but still has pain when he lifts a fork to his mouth to eat. No recent red meat intake or alcohol consumption. H/o gout in his fingers.  ROS Otherwise per HPI.  Assessment & Plan: Visit Diagnoses:  1. Acute idiopathic gout of left elbow   Ultrasound of joint showing effusion with crystal formation consistent with gout.   Plan:  - will trial steroid dose pack  - if pain persists, can return for injection  Meds & Orders:  Meds ordered this encounter  Medications  . methylPREDNISolone (MEDROL DOSEPAK) 4 MG TBPK tablet    Sig: As directed for 6 days.    Dispense:  21 tablet    Refill:  0   No orders of the defined types were placed in this encounter.   Follow-up: No follow-ups on file.   Procedures: No procedures performed  No notes on file   Clinical History: No specialty comments available.   He reports that he quit smoking about 34 years ago. His smoking use included cigarettes. He has a 34.00 pack-year smoking history. He has never used smokeless tobacco.  Recent Labs    06/16/18 0919   HGBA1C 6.4    Objective:  VS:  HT:    WT:   BMI:     BP:   HR: bpm  TEMP: ( )  RESP:  Physical Exam  PHYSICAL EXAM: Gen: NAD, alert, cooperative with exam, well-appearing HEENT: clear conjunctiva,  CV:  no edema, capillary refill brisk, normal rate Resp: non-labored Skin: no rashes, normal turgor  Neuro: no gross deficits.  Psych:  alert and oriented  Ortho Exam  Left elbow: - Inspection: 2+ effusion of joint - Palpation: TTP over olecranon, lateral epicondyle, and joint space - ROM: Flexion reduced to ~40 degrees secondary to pain, full extension.  - Strength: 5/5 strength in wrist flexion and extension without pain. 5/5 strength in biceps, triceps- pain with triceps activation. - Neuro: NV intact distally - Special testing: no laxity with varus/valgus stress,   Imaging: Elbow X-rays from 8/28:  Mild- moderate OA of elbow joint   Past Medical/Family/Surgical/Social History: Medications & Allergies reviewed per EMR, new medications updated. Patient Active Problem List   Diagnosis Date Noted  . Idiopathic gout of multiple sites 10/07/2018  . Grief 06/16/2018  . Dizziness 06/16/2018  . Allergic rhinitis 04/24/2018  . S/P CABG x 3 09/02/2017  . Leukocytosis 08/27/2017  . Visit for well man health check 05/07/2017  . COPD (chronic obstructive pulmonary disease) ([place]) 05/07/2017  .  Chronic systolic CHF (congestive heart failure) ([place]) 10/18/2016  . Generalized OA 06/22/2014  . CKD (chronic kidney disease) stage 3, GFR 30-59 ml/min (HCC)   . Synovial cyst of lumbar spine 07/17/2013    Class: Diagnosis of  . Osteoarthritis of left hip 09/17/2012  . Automatic implantable cardioverter-defibrillator in situ - St Jude 05/21/2012  . Chronic systolic heart failure ([place]) 05/01/2011  . IMPOTENCE OF ORGANIC ORIGIN 02/20/2010  . Unspecified vitamin D deficiency 02/14/2010  . Type 2 diabetes with nephropathy ([place]) 01/11/2010  . ICD-St.Jude 05/10/2009  . HLD (hyperlipidemia)  02/24/2008  . Essential hypertension 02/24/2008  . Non-ST elevation (NSTEMI) myocardial infarction ([place]) 02/24/2008  . HYPERSOMNIA UNSPECIFIED 02/24/2008   Past Medical History:  Diagnosis Date  . AICD (automatic cardioverter/defibrillator) present 10/18/2016   "replaced 2009-St. Jude device, Dr Lovena Le"  . Anemia   . Arthritis    "back" (08/27/2017)  . CAD (coronary artery disease)   . CHF (congestive heart failure) ([place])   . CKD (chronic kidney disease) stage 3, GFR 30-59 ml/min (HCC)    [name]  . Diverticulosis   . Dyslipidemia   . GERD (gastroesophageal reflux disease)   . Hypertension   . Neuromuscular disorder (HCC)    neuropathy- in hands  . Osteoarthritis    hips, hands  . Pacemaker   . Shortness of breath   . Sleep apnea    started a sleep study but didn't completed, states PCP told him he has sleep  apnea, doesn't use CPAP  . Systolic dysfunction   . Type 2 diabetes with nephropathy ([place])   . Vitamin D deficiency    Family History  Problem Relation Age of Onset  . Heart attack Father 21  . Diabetes Brother    Past Surgical History:  Procedure Laterality Date  . ANTERIOR CERVICAL DECOMP/DISCECTOMY FUSION    . BACK SURGERY    . CARPAL TUNNEL RELEASE Left   . CORONARY ARTERY BYPASS GRAFT N/A 09/02/2017   Procedure: CORONARY ARTERY BYPASS GRAFTING (CABG) x three , using left internal mammary artery and right leg greater saphenous vein harvested endoscopically;  Surgeon: [name], MD;  Location: [facility];  Service: Open Heart Surgery;  Laterality: N/A;  . CYST EXCISION     "back"  . ICD IMPLANT N/A 10/18/2016   Procedure: ICD Implant- Upgrade to Dual Chamber;  Surgeon: Evans Lance, MD;  Location: [facility];  Service: Cardiovascular;  Laterality: N/A;  . INSERT / REPLACE / REMOVE PACEMAKER    . JOINT REPLACEMENT    . LEFT HEART CATH AND CORONARY ANGIOGRAPHY N/A 08/30/2017   Procedure: LEFT HEART CATH AND CORONARY ANGIOGRAPHY;  Surgeon: Martinique, Peter M,  MD;  Location: [facility];  Service: Cardiovascular;  Laterality: N/A;  . LUMBAR LAMINECTOMY Left 07/17/2013   Procedure: MICRODISCECTOMY LUMBAR LAMINECTOMY;  Surgeon: Marybelle Killings, MD;  Location: [facility];  Service: Orthopedics;  Laterality: Left;  Left L4 Laminotomy, Lateral Recess Decompression, Cyst Excision  . SHOULDER OPEN ROTATOR CUFF REPAIR Left   . TEE WITHOUT CARDIOVERSION N/A 09/02/2017   Procedure: TRANSESOPHAGEAL ECHOCARDIOGRAM (TEE);  Surgeon: [name], MD;  Location: [facility];  Service: Open Heart Surgery;  Laterality: N/A;  . TOTAL HIP ARTHROPLASTY Left 09/17/2012   Procedure: Left TOTAL HIP ARTHROPLASTY ANTERIOR APPROACH;  Surgeon: Marybelle Killings, MD;  Location: [facility];  Service: Orthopedics;  Laterality: Left;   Social History   Occupational History  . 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 Smoking status: Former Smoker    Packs/day: 2.00    Years: 17.00    Pack years: 34.00    Types: Cigarettes    Quit date: 07/30/1984    Years since quitting: 34.6  . Smokeless tobacco: Never Used  Substance and Sexual Activity  . Alcohol use: Yes    Comment: 08/27/2017 "probably have had 6 beers in the last 6 months; drink when I play golf"  . Drug use: No  . Sexual activity: Never

## 2019-03-30 NOTE — Progress Notes (Signed)
I saw and examined the patient with Dr. Mayer Masker and agree with assessment and plan as outlined.  Elbow effusion with US findings consistent with gout.  Will treat with medrol pack.  Injection if persists.

## 2019-03-30 NOTE — Telephone Encounter (Signed)
Pt agreed to an appt on 9/4 at 10:00 AM. E-mail has been sent to industry to check pt device on this date and time.

## 2019-03-31 ENCOUNTER — Ambulatory Visit (INDEPENDENT_AMBULATORY_CARE_PROVIDER_SITE_OTHER): Payer: Medicare HMO

## 2019-03-31 DIAGNOSIS — I5022 Chronic systolic (congestive) heart failure: Secondary | ICD-10-CM | POA: Diagnosis not present

## 2019-03-31 DIAGNOSIS — Z95810 Presence of automatic (implantable) cardiac defibrillator: Secondary | ICD-10-CM

## 2019-04-01 ENCOUNTER — Telehealth: Payer: Self-pay

## 2019-04-01 NOTE — Telephone Encounter (Signed)

## 2019-04-02 ENCOUNTER — Ambulatory Visit (INDEPENDENT_AMBULATORY_CARE_PROVIDER_SITE_OTHER): Payer: Medicare HMO | Admitting: *Deleted

## 2019-04-02 ENCOUNTER — Other Ambulatory Visit: Payer: Self-pay

## 2019-04-02 DIAGNOSIS — Z95810 Presence of automatic (implantable) cardiac defibrillator: Secondary | ICD-10-CM

## 2019-04-02 DIAGNOSIS — I255 Ischemic cardiomyopathy: Secondary | ICD-10-CM

## 2019-04-02 NOTE — Patient Instructions (Addendum)
Please send a manual transmission from your home monitor. Press the round white button once (stars should light up), then press it again and the lights should go to the man with the heart. Call the [facility] at [phone] with any questions.  Follow up with Dr. Lovena Le on 04/28/19 at 11:30am as scheduled.

## 2019-04-03 NOTE — Progress Notes (Signed)
EPIC Encounter for ICM Monitoring  Patient Name: Randy [name] is a 73 y.o. male Date: 04/03/2019 Primary Care Physican: Lucille Passy, MD Primary Cardiologist:Taylor Electrophysiologist:Taylor Last Weight: 198lbs baseline   Spoke with patient.  He said he was doing fine and denies fluid symptoms.  ER visit 03/27/2019.  [place]vue thoracic impedancenormal but suggestive of possible fluid accumulation 03/19/2019 -03/24/2019.  Prescribed: Furosemide40 mg 1 tablet daily  Labs: 06/16/2018 Creatinine 2.36, BUN 47, Potassium 5.4, Sodium 141, GFR 28.93 04/04/2018 Creatinine 2.3 BUN 50, Potassium 5.0, Sodium 140 A complete set of results can be found in Results Review.  Recommendations: No changes and encouraged to call if experiencing any fluid symptoms.  Follow-up plan: ICM clinic phone appointment on 05/29/2019.   Office appt 04/28/2019 with Dr. Lovena Le.    Copy of ICM check sent to Dr. Lovena Le.   3 month ICM trend: 04/03/2019    1 Year ICM trend:       Rosalene Billings, RN 04/03/2019 1:56 PM

## 2019-04-08 LAB — CUP PACEART INCLINIC DEVICE CHECK
Battery Remaining Longevity: 78 mo
Brady Statistic RA Percent Paced: 0.26 %
Brady Statistic RV Percent Paced: 0.04 %
Date Time Interrogation Session: 20200903130855
HighPow Impedance: 71 Ohm
Implantable Lead Implant Date: 20091022
Implantable Lead Implant Date: 20180322
Implantable Lead Location: 753859
Implantable Lead Location: 753860
Implantable Lead Model: 7121
Implantable Pulse Generator Implant Date: 20180322
Lead Channel Impedance Value: 1075 Ohm
Lead Channel Impedance Value: 300 Ohm
Lead Channel Pacing Threshold Amplitude: 0.5 V
Lead Channel Pacing Threshold Amplitude: 0.5 V
Lead Channel Pacing Threshold Amplitude: 1.25 V
Lead Channel Pacing Threshold Amplitude: 1.25 V
Lead Channel Pacing Threshold Pulse Width: 0.5 ms
Lead Channel Pacing Threshold Pulse Width: 0.5 ms
Lead Channel Pacing Threshold Pulse Width: 0.6 ms
Lead Channel Pacing Threshold Pulse Width: 0.6 ms
Lead Channel Sensing Intrinsic Amplitude: 1.5 mV
Lead Channel Sensing Intrinsic Amplitude: 12 mV
Lead Channel Setting Pacing Amplitude: 2 V
Lead Channel Setting Pacing Amplitude: 2.5 V
Lead Channel Setting Pacing Pulse Width: 0.6 ms
Lead Channel Setting Sensing Sensitivity: 0.5 mV
Pulse Gen Serial Number: 7413245

## 2019-04-08 NOTE — Progress Notes (Signed)
Device check in clinic by industry due to RF/transmitter communication error. RF telemetry re-established via programmer. Lead trends stable. <1% AT/AF burden--available EGM shows brief AF, duration 56sec, +amiodarone. No programming changes this session. Merlin on 07/02/19, ROV with Dr. Lovena Le on 04/28/19.

## 2019-04-21 ENCOUNTER — Ambulatory Visit: Payer: Medicare HMO

## 2019-04-27 ENCOUNTER — Telehealth: Payer: Self-pay | Admitting: Family Medicine

## 2019-04-27 NOTE — Telephone Encounter (Signed)
Yes okay to schedule.  Very nice man.

## 2019-04-27 NOTE — Telephone Encounter (Signed)
Best number [phone] Pt called wanting to transfer care from dr Deborra Medina to [facility].  He stated [facility] is closer.  Ok to schedule??

## 2019-04-27 NOTE — Telephone Encounter (Signed)
That is fine with me. Ok to schedule.

## 2019-04-28 ENCOUNTER — Ambulatory Visit: Payer: Medicare HMO | Admitting: Internal Medicine

## 2019-04-28 ENCOUNTER — Other Ambulatory Visit: Payer: Self-pay

## 2019-04-28 ENCOUNTER — Ambulatory Visit (INDEPENDENT_AMBULATORY_CARE_PROVIDER_SITE_OTHER): Payer: Medicare HMO

## 2019-04-28 ENCOUNTER — Encounter: Payer: Self-pay | Admitting: Internal Medicine

## 2019-04-28 VITALS — BP 78/56 | HR 69 | Ht 67.5 in | Wt 203.2 lb

## 2019-04-28 DIAGNOSIS — I10 Essential (primary) hypertension: Secondary | ICD-10-CM | POA: Diagnosis not present

## 2019-04-28 DIAGNOSIS — Z23 Encounter for immunization: Secondary | ICD-10-CM

## 2019-04-28 DIAGNOSIS — Z95810 Presence of automatic (implantable) cardiac defibrillator: Secondary | ICD-10-CM | POA: Diagnosis not present

## 2019-04-28 DIAGNOSIS — I5022 Chronic systolic (congestive) heart failure: Secondary | ICD-10-CM | POA: Diagnosis not present

## 2019-04-28 DIAGNOSIS — I4891 Unspecified atrial fibrillation: Secondary | ICD-10-CM | POA: Diagnosis not present

## 2019-04-28 DIAGNOSIS — I472 Ventricular tachycardia: Secondary | ICD-10-CM | POA: Diagnosis not present

## 2019-04-28 MED ORDER — AMIODARONE HCL 200 MG PO TABS: ORAL_TABLET | ORAL | 3 refills | Status: DC

## 2019-04-28 NOTE — Patient Instructions (Addendum)
Medication Instructions:  Your physician has recommended you make the following change in your medication:   1.  Reduce your amiodarone 200 mg--take one tablet by mouth Monday through Saturday.  Do NOT take on Sunday.  Labwork: None ordered.  Testing/Procedures: None ordered.  Follow-Up: Your physician wants you to follow-up in: one year with Dr. Lovena Le.   You will receive a reminder letter in the mail two months in advance. If you don't receive a letter, please call our office to schedule the follow-up appointment.  Remote monitoring is used to monitor your ICD from home. This monitoring reduces the number of office visits required to check your device to one time per year. It allows [place] to keep an eye on the functioning of your device to ensure it is working properly. You are scheduled for a device check from home on 05/29/2019. You may send your transmission at any time that day. If you have a wireless device, the transmission will be sent automatically. After your physician reviews your transmission, you will receive a postcard with your next transmission date.  Any Other Special Instructions Will Be Listed Below (If Applicable).  If you need a refill on your cardiac medications before your next appointment, please call your pharmacy.

## 2019-04-28 NOTE — Progress Notes (Signed)
HPI [name] returns today for followup. He is a pleasant 73yo man with chronic systolic heart failure, CAD, s/p CABG and VT. I had thought he was off his amiodarone but it appears he is on it. He is a little better but still has dyspnea and bendopnea.He recently saw Dr. Lamonte [name] and was placed on bronchodilators. He is about the same. No ICD therapies No Known Allergies   Current Outpatient Medications  Medication Sig Dispense Refill  . Accu-Chek Softclix Lancets lancets USE AS DIRECTED  TO TEST BLOOD SUGAR ONE TIME DAILY  AND AS NEEDED 200 each 2  . Alcohol Swabs (B-D SINGLE USE SWABS REGULAR) PADS Use as instructed to clean area for glucose monitoring once daily and as needed.  Diagnosis:  E11.21  Non insulin-dependent 100 each 3  . amiodarone (PACERONE) 200 MG tablet Take 200 mg by mouth daily.    Marland Kitchen aspirin EC 325 MG tablet Take 1 tablet (325 mg total) by mouth daily.    Marland Kitchen atorvastatin (LIPITOR) 80 MG tablet Take 1 tablet (80 mg total) by mouth daily at 6 PM. 90 tablet 1  . Blood Glucose Calibration (ACCU-CHEK AVIVA) SOLN Use to calibrate blood glucose machine as recommended.  Diagnosis:  E11.21  Non-insulin dependent. 1 each 2  . Blood Glucose Monitoring Suppl (ACCU-CHEK AVIVA PLUS) w/Device KIT USE TO CHECK BLOOD SUGAR ONCE DAILY. NEED MD APPOINTMENT 1 kit 0  . carvedilol (COREG) 3.125 MG tablet TAKE 1 TABLET BY MOUTH 2 TIMES DAILY. 180 tablet 2  . finasteride (PROSCAR) 5 MG tablet TAKE 1 TABLET EVERY DAY 90 tablet 1  . furosemide (LASIX) 20 MG tablet TAKE 1 TABLET BY MOUTH EVERY DAY 90 tablet 2  . furosemide (LASIX) 40 MG tablet Take 1 tablet (40 mg total) by mouth daily. 90 tablet 3  . gabapentin (NEURONTIN) 300 MG capsule Take 2 capsules (600 mg total) by mouth 2 (two) times daily. 360 capsule 2  . glipiZIDE (GLUCOTROL) 5 MG tablet TAKE 1 TABLET EVERY DAY BEFORE BREAKFAST 90 tablet 2  . glucose blood (ACCU-CHEK AVIVA PLUS) test strip Test Blood Sugar 1 time daily: Dx:E11.27 100  each 3  . HYDROcodone-acetaminophen (NORCO/VICODIN) 5-325 MG tablet Take 1 tablet by mouth every 6 (six) hours as needed. 5 tablet 0  . methylPREDNISolone (MEDROL DOSEPAK) 4 MG TBPK tablet As directed for 6 days. 21 tablet 0  . sacubitril-valsartan (ENTRESTO) 24-26 MG Take 1 tablet by mouth 2 (two) times daily. Please make yearly appt with Dr. Lovena Le for October for future refills. 1st attempt 180 tablet 0  . tamsulosin (FLOMAX) 0.4 MG CAPS capsule TAKE 1 CAPSULE EVERY DAY 90 capsule 2  . Tiotropium Bromide-Olodaterol (STIOLTO RESPIMAT) 2.5-2.5 MCG/ACT AERS Inhale 2 puffs into the lungs daily. 1 Inhaler 3  . traMADol (ULTRAM) 50 MG tablet Take 1 tablet (50 mg total) by mouth every 6 (six) hours as needed for moderate pain. 30 tablet 0   Current Facility-Administered Medications  Medication Dose Route Frequency Provider Last Rate Last Dose  . methylPREDNISolone acetate (DEPO-MEDROL) injection 20 mg  20 mg Intra-articular Once Hilts, Michael, MD      . methylPREDNISolone acetate (DEPO-MEDROL) injection 40 mg  40 mg Intra-articular Once Hilts, Legrand Como, MD         Past Medical History:  Diagnosis Date  . AICD (automatic cardioverter/defibrillator) present 10/18/2016   "replaced 2009-St. Jude device, Dr Lovena Le"  . Anemia   . Arthritis    "back" (08/27/2017)  .  CAD (coronary artery disease)   . CHF (congestive heart failure) ([place])   . CKD (chronic kidney disease) stage 3, GFR 30-59 ml/min (HCC)    [name]  . Diverticulosis   . Dyslipidemia   . GERD (gastroesophageal reflux disease)   . Hypertension   . Neuromuscular disorder (HCC)    neuropathy- in hands  . Osteoarthritis    hips, hands  . Pacemaker   . Shortness of breath   . Sleep apnea    started a sleep study but didn't completed, states PCP told him he has sleep  apnea, doesn't use CPAP  . Systolic dysfunction   . Type 2 diabetes with nephropathy ([place])   . Vitamin D deficiency     ROS:   All systems reviewed and negative  except as noted in the HPI.   Past Surgical History:  Procedure Laterality Date  . ANTERIOR CERVICAL DECOMP/DISCECTOMY FUSION    . BACK SURGERY    . CARPAL TUNNEL RELEASE Left   . CORONARY ARTERY BYPASS GRAFT N/A 09/02/2017   Procedure: CORONARY ARTERY BYPASS GRAFTING (CABG) x three , using left internal mammary artery and right leg greater saphenous vein harvested endoscopically;  Surgeon: [name], MD;  Location: [facility];  Service: Open Heart Surgery;  Laterality: N/A;  . CYST EXCISION     "back"  . ICD IMPLANT N/A 10/18/2016   Procedure: ICD Implant- Upgrade to Dual Chamber;  Surgeon: Evans Lance, MD;  Location: [facility];  Service: Cardiovascular;  Laterality: N/A;  . INSERT / REPLACE / REMOVE PACEMAKER    . JOINT REPLACEMENT    . LEFT HEART CATH AND CORONARY ANGIOGRAPHY N/A 08/30/2017   Procedure: LEFT HEART CATH AND CORONARY ANGIOGRAPHY;  Surgeon: Martinique, Peter M, MD;  Location: [facility];  Service: Cardiovascular;  Laterality: N/A;  . LUMBAR LAMINECTOMY Left 07/17/2013   Procedure: MICRODISCECTOMY LUMBAR LAMINECTOMY;  Surgeon: Marybelle Killings, MD;  Location: [facility];  Service: Orthopedics;  Laterality: Left;  Left L4 Laminotomy, Lateral Recess Decompression, Cyst Excision  . SHOULDER OPEN ROTATOR CUFF REPAIR Left   . TEE WITHOUT CARDIOVERSION N/A 09/02/2017   Procedure: TRANSESOPHAGEAL ECHOCARDIOGRAM (TEE);  Surgeon: [name], MD;  Location: [facility];  Service: Open Heart Surgery;  Laterality: N/A;  . TOTAL HIP ARTHROPLASTY Left 09/17/2012   Procedure: Left TOTAL HIP ARTHROPLASTY ANTERIOR APPROACH;  Surgeon: Marybelle Killings, MD;  Location: [facility];  Service: Orthopedics;  Laterality: Left;     Family History  Problem Relation Age of Onset  . Heart attack Father 29  . Diabetes Brother      Social History   Socioeconomic History  . Marital status: Divorced    Spouse name: Not on file  . Number of children: 2  . Years of education: Not on file  . Highest  education level: Not on file  Occupational History  . 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 Financial resource strain: Not on file  . Food insecurity    Worry: Not on file    Inability: Not on file  . Transportation needs    Medical: Not on file    Non-medical: Not on file  Tobacco Use  . Smoking status: Former Smoker    Packs/day: 2.00    Years: 17.00    Pack years: 34.00    Types: Cigarettes    Quit date: 07/30/1984    Years since  quitting: 34.7  . Smokeless tobacco: Never Used  Substance and Sexual Activity  . Alcohol use: Yes    Comment: 08/27/2017 "probably have had 6 beers in the last 6 months; drink when I play golf"  . Drug use: No  . Sexual activity: Never  Lifestyle  . Physical activity    Days per week: Not on file    Minutes per session: Not on file  . Stress: Not on file  Relationships  . Social Herbalist on phone: Not on file    Gets together: Not on file    Attends religious service: Not on file    Active member of club or organization: Not on file    Attends meetings of clubs or organizations: Not on file    Relationship status: Not on file  . Intimate partner violence    Fear of current or ex partner: Not on file    Emotionally abused: Not on file    Physically abused: Not on file    Forced sexual activity: Not on file  Other Topics Concern  . Not on file  Social History Narrative   Does not have a living will.   Desires CPR, does want life support if not futile.        BP (!) 78/56   Pulse 69   Ht 5' 7.5" (1.715 m)   Wt 203 lb 3.2 oz (92.2 kg)   SpO2 96%   BMI 31.36 kg/m   Physical Exam:  Well appearing NAD HEENT: Unremarkable Neck:  No JVD, no thyromegally Lymphatics:  No adenopathy Back:  No CVA tenderness Lungs:  Clear with minimal expiratory wheezes HEART:  Regular rate rhythm, no murmurs, no rubs, no clicks Abd:  soft, positive bowel sounds, no organomegally, no  rebound, no guarding Ext:  2 plus pulses, no edema, no cyanosis, no clubbing Skin:  No rashes no nodules Neuro:  CN II through XII intact, motor grossly intact  DEVICE  Normal device function.  See PaceArt for details.   Assess/Plan: 1. VT - he has not had any. We will follow. 2. PAF - he has maintained NSR 99.9% of the time. I have asked him to stop his Sunday dose of amiodarone.  3. ICD - his St. Jude DDD ICD is working normally. 4. Chronic systolic heart failure - his symptoms are class 2. He is encouraged to reduce his sodium intake. He will continue his current meds.   Mikle [email or web address].D.

## 2019-04-29 NOTE — Telephone Encounter (Signed)
Appointment 10/7 Pt aware

## 2019-05-06 ENCOUNTER — Ambulatory Visit (INDEPENDENT_AMBULATORY_CARE_PROVIDER_SITE_OTHER): Payer: Medicare HMO | Admitting: Family Medicine

## 2019-05-06 ENCOUNTER — Encounter: Payer: Self-pay | Admitting: Family Medicine

## 2019-05-06 ENCOUNTER — Other Ambulatory Visit: Payer: Self-pay

## 2019-05-06 VITALS — BP 82/42 | HR 61 | Temp 98.3°F | Ht 68.0 in | Wt 204.1 lb

## 2019-05-06 DIAGNOSIS — Z7689 Persons encountering health services in other specified circumstances: Secondary | ICD-10-CM | POA: Diagnosis not present

## 2019-05-06 DIAGNOSIS — I5022 Chronic systolic (congestive) heart failure: Secondary | ICD-10-CM

## 2019-05-06 DIAGNOSIS — E1121 Type 2 diabetes mellitus with diabetic nephropathy: Secondary | ICD-10-CM | POA: Diagnosis not present

## 2019-05-06 DIAGNOSIS — I959 Hypotension, unspecified: Secondary | ICD-10-CM | POA: Diagnosis not present

## 2019-05-06 LAB — CBC WITH DIFFERENTIAL/PLATELET
Basophils Absolute: 0.1 10*3/uL (ref 0.0–0.1)
Basophils Relative: 1.2 % (ref 0.0–3.0)
Eosinophils Absolute: 0.2 10*3/uL (ref 0.0–0.7)
Eosinophils Relative: 3.1 % (ref 0.0–5.0)
HCT: 32.6 % — ABNORMAL LOW (ref 39.0–52.0)
Hemoglobin: 10.8 g/dL — ABNORMAL LOW (ref 13.0–17.0)
Lymphocytes Relative: 22.1 % (ref 12.0–46.0)
Lymphs Abs: 1.6 10*3/uL (ref 0.7–4.0)
MCHC: 33.2 g/dL (ref 30.0–36.0)
MCV: 88 fl (ref 78.0–100.0)
Monocytes Absolute: 0.6 10*3/uL (ref 0.1–1.0)
Monocytes Relative: 8.2 % (ref 3.0–12.0)
Neutro Abs: 4.7 10*3/uL (ref 1.4–7.7)
Neutrophils Relative %: 65.4 % (ref 43.0–77.0)
Platelets: 180 10*3/uL (ref 150.0–400.0)
RBC: 3.71 Mil/uL — ABNORMAL LOW (ref 4.22–5.81)
RDW: 13.8 % (ref 11.5–15.5)
WBC: 7.2 10*3/uL (ref 4.0–10.5)

## 2019-05-06 LAB — HEMOGLOBIN A1C: Hgb A1c MFr Bld: 8.5 % — ABNORMAL HIGH (ref 4.6–6.5)

## 2019-05-06 NOTE — Patient Instructions (Signed)
Good to meet you  Stop at the front and schedule Annual Wellness Visit with wellness nurse and 6 month follow up with me

## 2019-05-06 NOTE — Progress Notes (Signed)
Subjective:    Patient ID: Randy [name], male    DOB: [birth year], 73 y.o.   MRN: [id number]  HPI This is a 73 yo male who presents today to establish care.  Previously followed by Dr. Deborra Medina.  He is divorced and lives alone with his 3 dogs and 3 cats.  He is retired.  Last CPE-06/16/2018 PSA-06/16/2018 Colonoscopy-02/06/2010 Tdap-01/14/2008 Flu-annual Dental-regular Exercise-plays golf 1 time a week and walks his dogs for about 15 minutes daily  Diabetes mellitus type 2- he does not check his blood sugars at home.  His weight has been stable.  He eats primarily fast food and sandwiches.  CHF- he has a history of pacemaker and AICD.  He is followed by Dr. Lovena Le.  He has not had any firing of his AICD this year.  Past Medical History:  Diagnosis Date  . AICD (automatic cardioverter/defibrillator) present 10/18/2016   "replaced 2009-St. Jude device, Dr Lovena Le"  . Anemia   . Arthritis    "back" (08/27/2017)  . CAD (coronary artery disease)   . CHF (congestive heart failure) ([place])   . CKD (chronic kidney disease) stage 3, GFR 30-59 ml/min    [name]  . Diverticulosis   . Dyslipidemia   . GERD (gastroesophageal reflux disease)   . Hypertension   . Neuromuscular disorder (HCC)    neuropathy- in hands  . Osteoarthritis    hips, hands  . Pacemaker   . Shortness of breath   . Sleep apnea    started a sleep study but didn't completed, states PCP told him he has sleep  apnea, doesn't use CPAP  . Systolic dysfunction   . Type 2 diabetes with nephropathy ([place])   . Vitamin D deficiency    Past Surgical History:  Procedure Laterality Date  . ANTERIOR CERVICAL DECOMP/DISCECTOMY FUSION    . BACK SURGERY    . CARPAL TUNNEL RELEASE Left   . CORONARY ARTERY BYPASS GRAFT N/A 09/02/2017   Procedure: CORONARY ARTERY BYPASS GRAFTING (CABG) x three , using left internal mammary artery and right leg greater saphenous vein harvested endoscopically;  Surgeon: [name], MD;  Location: [facility]  [facility];  Service: Open Heart Surgery;  Laterality: N/A;  . CYST EXCISION     "back"  . ICD IMPLANT N/A 10/18/2016   Procedure: ICD Implant- Upgrade to Dual Chamber;  Surgeon: Evans Lance, MD;  Location: [facility];  Service: Cardiovascular;  Laterality: N/A;  . INSERT / REPLACE / REMOVE PACEMAKER    . JOINT REPLACEMENT    . LEFT HEART CATH AND CORONARY ANGIOGRAPHY N/A 08/30/2017   Procedure: LEFT HEART CATH AND CORONARY ANGIOGRAPHY;  Surgeon: Martinique, Peter M, MD;  Location: [facility];  Service: Cardiovascular;  Laterality: N/A;  . LUMBAR LAMINECTOMY Left 07/17/2013   Procedure: MICRODISCECTOMY LUMBAR LAMINECTOMY;  Surgeon: Marybelle Killings, MD;  Location: [facility];  Service: Orthopedics;  Laterality: Left;  Left L4 Laminotomy, Lateral Recess Decompression, Cyst Excision  . SHOULDER OPEN ROTATOR CUFF REPAIR Left   . TEE WITHOUT CARDIOVERSION N/A 09/02/2017   Procedure: TRANSESOPHAGEAL ECHOCARDIOGRAM (TEE);  Surgeon: [name], MD;  Location: [facility];  Service: Open Heart Surgery;  Laterality: N/A;  . TOTAL HIP ARTHROPLASTY Left 09/17/2012   Procedure: Left TOTAL HIP ARTHROPLASTY ANTERIOR APPROACH;  Surgeon: Marybelle Killings, MD;  Location: [facility];  Service: Orthopedics;  Laterality: Left;   Family History  Problem Relation Age of Onset  . Heart attack Father 34  .  Diabetes Brother    Social History   Tobacco Use  . Smoking status: Former Smoker    Packs/day: 2.00    Years: 17.00    Pack years: 34.00    Types: Cigarettes    Quit date: 07/30/1984    Years since quitting: 34.7  . Smokeless tobacco: Never Used  Substance Use Topics  . Alcohol use: Yes    Comment: 08/27/2017 "probably have had 6 beers in the last 6 months; drink when I play golf"  . Drug use: No     Review of Systems  Constitutional: Negative for unexpected weight change.  Respiratory: Positive for shortness of breath (with brisk walking). Negative for cough and chest tightness.   Cardiovascular: Negative for  chest pain and leg swelling.  Gastrointestinal: Positive for abdominal pain. Negative for constipation, diarrhea, nausea and vomiting.  Genitourinary: Negative for difficulty urinating.  Neurological: Negative for dizziness, syncope, weakness, light-headedness and headaches.       Objective:   Physical Exam Physical Exam  Constitutional: Oriented to person, place, and time. He appears well-developed and well-nourished.  HENT:  Head: Normocephalic and atraumatic.  Eyes: Conjunctivae are normal.  Neck: Normal range of motion. Neck supple.  Cardiovascular: Normal rate, regular rhythm and normal heart sounds.   Pulmonary/Chest: Effort normal and breath sounds normal.  Musculoskeletal: Normal range of motion.  Neurological: Alert and oriented to person, place, and time.  Skin: Skin is warm and dry.  Psychiatric: Normal mood and affect. Behavior is normal. Judgment and thought content normal.  Vitals reviewed.    BP (!) 82/42 (BP Location: Right Arm, Patient Position: Sitting, Cuff Size: Normal)   Pulse 61   Temp 98.3 F (36.8 C) (Temporal)   Ht 5\' 8"  (1.727 m)   Wt 204 lb 1.9 oz (92.6 kg)   SpO2 97%   BMI 31.04 kg/m  Wt Readings from Last 3 Encounters:  05/06/19 204 lb 1.9 oz (92.6 kg)  04/28/19 203 lb 3.2 oz (92.2 kg)  03/27/19 205 lb (93 kg)        Assessment & Plan:  1. Encounter to establish care -Too soon to do his well man exam but will have him follow-up for his annual wellness visit next month  2. Type 2 diabetes with nephropathy (HCC) - Hemoglobin A1c - CBC with Differential - Comprehensive metabolic panel; Future  3. Chronic systolic heart failure (HCC) - CBC with Differential - Comprehensive metabolic panel; Future  4. Hypotension, unspecified hypotension type -Patient asymptomatic and previous note from cardiologist reveals blood pressure reading in the 70s.  I will touch base with his cardiologist to see if any changes in his medication need to be made.  - CBC with Differential - Comprehensive metabolic panel; Future  -Follow-up in 6 months sooner if labs show abnormalities Clarene Reamer, FNP-BC  [facility], [place]  05/07/2019 3:15 PM

## 2019-05-06 NOTE — Progress Notes (Signed)
New Patient Office Visit  Subjective:  Patient ID: Randy [name], male    DOB: November 18, 1945  Age: 73 y.o. MRN: [id number]  CC:  Chief Complaint  Patient presents with  . New Patient (Initial Visit)    TOC from Dr Deborra Medina. No new concerns    HPI DARWIN ROTHLISBERGER presents to establish a new healthcare provider. Pt lives by himself. He has four dogs and cats at home and walks them for 15 minutes once a day. Pt reports some SOB with working but it is his baseline. No acute changes at this time.  DM- pt denies any neuropathy. He does not check his blood sugars.  CHF- followed by cardilogist   Dentist -several years ago Eye doctor- 2019 Exercise- golf once week Diet- sandwiches   Pt dose not have any other concerns or questions at this time.   Past Medical History:  Diagnosis Date  . AICD (automatic cardioverter/defibrillator) present 10/18/2016   "replaced 2009-St. Jude device, Dr Lovena Le"  . Anemia   . Arthritis    "back" (08/27/2017)  . CAD (coronary artery disease)   . CHF (congestive heart failure) ([place])   . CKD (chronic kidney disease) stage 3, GFR 30-59 ml/min    [name]  . Diverticulosis   . Dyslipidemia   . GERD (gastroesophageal reflux disease)   . Hypertension   . Neuromuscular disorder (HCC)    neuropathy- in hands  . Osteoarthritis    hips, hands  . Pacemaker   . Shortness of breath   . Sleep apnea    started a sleep study but didn't completed, states PCP told him he has sleep  apnea, doesn't use CPAP  . Systolic dysfunction   . Type 2 diabetes with nephropathy ([place])   . Vitamin D deficiency     Past Surgical History:  Procedure Laterality Date  . ANTERIOR CERVICAL DECOMP/DISCECTOMY FUSION    . BACK SURGERY    . CARPAL TUNNEL RELEASE Left   . CORONARY ARTERY BYPASS GRAFT N/A 09/02/2017   Procedure: CORONARY ARTERY BYPASS GRAFTING (CABG) x three , using left internal mammary artery and right leg greater saphenous vein harvested endoscopically;  Surgeon:  [name], MD;  Location: [facility] OR;  Service: Open Heart Surgery;  Laterality: N/A;  . CYST EXCISION     "back"  . ICD IMPLANT N/A 10/18/2016   Procedure: ICD Implant- Upgrade to Dual Chamber;  Surgeon: Evans Lance, MD;  Location: [facility];  Service: Cardiovascular;  Laterality: N/A;  . INSERT / REPLACE / REMOVE PACEMAKER    . JOINT REPLACEMENT    . LEFT HEART CATH AND CORONARY ANGIOGRAPHY N/A 08/30/2017   Procedure: LEFT HEART CATH AND CORONARY ANGIOGRAPHY;  Surgeon: Martinique, Peter M, MD;  Location: [place] [facility];  Service: Cardiovascular;  Laterality: N/A;  . LUMBAR LAMINECTOMY Left 07/17/2013   Procedure: MICRODISCECTOMY LUMBAR LAMINECTOMY;  Surgeon: Marybelle Killings, MD;  Location: [facility];  Service: Orthopedics;  Laterality: Left;  Left L4 Laminotomy, Lateral Recess Decompression, Cyst Excision  . SHOULDER OPEN ROTATOR CUFF REPAIR Left   . TEE WITHOUT CARDIOVERSION N/A 09/02/2017   Procedure: TRANSESOPHAGEAL ECHOCARDIOGRAM (TEE);  Surgeon: [name], MD;  Location: [facility];  Service: Open Heart Surgery;  Laterality: N/A;  . TOTAL HIP ARTHROPLASTY Left 09/17/2012   Procedure: Left TOTAL HIP ARTHROPLASTY ANTERIOR APPROACH;  Surgeon: Marybelle Killings, MD;  Location: [facility];  Service: Orthopedics;  Laterality: Left;    Family History  Problem Relation Age of Onset  . Heart attack Father 25  . Diabetes Brother     Social History   Socioeconomic History  . Marital status: Divorced    Spouse name: Not on file  . Number of children: 2  . Years of education: Not on file  . Highest education level: Not on file  Occupational History  . 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 Financial resource strain: Not on file  . Food insecurity    Worry: Not on file    Inability: Not on file  . Transportation needs    Medical: Not on file    Non-medical: Not on file  Tobacco Use  . Smoking status: Former Smoker    Packs/day: 2.00     Years: 17.00    Pack years: 34.00    Types: Cigarettes    Quit date: 07/30/1984    Years since quitting: 34.7  . Smokeless tobacco: Never Used  Substance and Sexual Activity  . Alcohol use: Yes    Comment: 08/27/2017 "probably have had 6 beers in the last 6 months; drink when I play golf"  . Drug use: No  . Sexual activity: Never  Lifestyle  . Physical activity    Days per week: Not on file    Minutes per session: Not on file  . Stress: Not on file  Relationships  . Social Herbalist on phone: Not on file    Gets together: Not on file    Attends religious service: Not on file    Active member of club or organization: Not on file    Attends meetings of clubs or organizations: Not on file    Relationship status: Not on file  . Intimate partner violence    Fear of current or ex partner: Not on file    Emotionally abused: Not on file    Physically abused: Not on file    Forced sexual activity: Not on file  Other Topics Concern  . Not on file  Social History Narrative   Does not have a living will.   Desires CPR, does want life support if not futile.       ROS Review of Systems  Constitutional: Negative for activity change, fatigue and fever.  HENT: Negative for rhinorrhea, sinus pressure, sinus pain and sore throat.   Eyes: Negative for pain.  Respiratory: Negative for cough, chest tightness, shortness of breath and wheezing.   Cardiovascular: Negative for chest pain, palpitations and leg swelling.  Gastrointestinal: Negative for abdominal distention, abdominal pain, constipation, diarrhea and nausea.  Genitourinary: Negative for difficulty urinating, dysuria and flank pain.  Musculoskeletal: Negative.   Skin: Negative.   Neurological: Negative for dizziness, tremors, facial asymmetry, weakness, light-headedness, numbness and headaches.  Hematological: Negative for adenopathy.      Objective:   Today's Vitals: BP (!) 82/42 (BP Location: Right Arm, Patient  Position: Sitting, Cuff Size: Normal)   Pulse 61   Temp 98.3 F (36.8 C) (Temporal)   Ht '5\' 8"'  (1.727 m)   Wt 92.6 kg   SpO2 97%   BMI 31.04 kg/m   Physical Exam Constitutional:      Appearance: Normal appearance.  HENT:     Head: Normocephalic and atraumatic.     Right Ear: Tympanic membrane normal. There is no impacted cerumen.     Left Ear: Tympanic membrane normal. There is no impacted cerumen.  Mouth/Throat:     Mouth: Mucous membranes are moist.     Pharynx: Oropharynx is clear. No posterior oropharyngeal erythema.  Eyes:     General: No scleral icterus.       Right eye: No discharge.        Left eye: No discharge.     Extraocular Movements: Extraocular movements intact.     Conjunctiva/sclera: Conjunctivae normal.     Pupils: Pupils are equal, round, and reactive to light.  Neck:     Musculoskeletal: Normal range of motion.  Cardiovascular:     Rate and Rhythm: Normal rate and regular rhythm.     Pulses: Normal pulses.     Heart sounds: Normal heart sounds. No murmur. No gallop.   Pulmonary:     Effort: Pulmonary effort is normal.     Breath sounds: Normal breath sounds. No rhonchi or rales.  Abdominal:     General: Bowel sounds are normal.     Palpations: Abdomen is soft.  Musculoskeletal: Normal range of motion.     Right lower leg: No edema.     Left lower leg: No edema.  Skin:    General: Skin is warm and dry.  Neurological:     General: No focal deficit present.     Mental Status: He is alert and oriented to person, place, and time. Mental status is at baseline.  Psychiatric:        Mood and Affect: Mood normal.        Thought Content: Thought content normal.     Assessment & Plan:  1. Type 2 diabetes with nephropathy (HCC) - Hemoglobin A1c - CBC with Differential - Comprehensive metabolic panel; Future  Problem List Items Addressed This Visit    None      Outpatient Encounter Medications as of 05/06/2019  Medication Sig  . Accu-Chek  Softclix Lancets lancets USE AS DIRECTED  TO TEST BLOOD SUGAR ONE TIME DAILY  AND AS NEEDED  . Alcohol Swabs (B-D SINGLE USE SWABS REGULAR) PADS Use as instructed to clean area for glucose monitoring once daily and as needed.  Diagnosis:  E11.21  Non insulin-dependent  . amiodarone (PACERONE) 200 MG tablet Take one tablet by mouth daily Monday through Saturday.  Do NOT take on Sunday,.  Marland Kitchen aspirin EC 325 MG tablet Take 1 tablet (325 mg total) by mouth daily.  Marland Kitchen atorvastatin (LIPITOR) 80 MG tablet Take 1 tablet (80 mg total) by mouth daily at 6 PM.  . Blood Glucose Calibration (ACCU-CHEK AVIVA) SOLN Use to calibrate blood glucose machine as recommended.  Diagnosis:  E11.21  Non-insulin dependent.  . Blood Glucose Monitoring Suppl (ACCU-CHEK AVIVA PLUS) w/Device KIT USE TO CHECK BLOOD SUGAR ONCE DAILY. NEED MD APPOINTMENT  . carvedilol (COREG) 3.125 MG tablet TAKE 1 TABLET BY MOUTH 2 TIMES DAILY.  . finasteride (PROSCAR) 5 MG tablet TAKE 1 TABLET EVERY DAY  . gabapentin (NEURONTIN) 300 MG capsule Take 2 capsules (600 mg total) by mouth 2 (two) times daily.  Marland Kitchen glipiZIDE (GLUCOTROL) 5 MG tablet TAKE 1 TABLET EVERY DAY BEFORE BREAKFAST  . glucose blood (ACCU-CHEK AVIVA PLUS) test strip Test Blood Sugar 1 time daily: Dx:E11.27  . HYDROcodone-acetaminophen (NORCO/VICODIN) 5-325 MG tablet Take 1 tablet by mouth every 6 (six) hours as needed.  . sacubitril-valsartan (ENTRESTO) 24-26 MG Take 1 tablet by mouth 2 (two) times daily. Please make yearly appt with Dr. Lovena Le for October for future refills. 1st attempt  . tamsulosin (FLOMAX) 0.4 MG CAPS  capsule TAKE 1 CAPSULE EVERY DAY  . Tiotropium Bromide-Olodaterol (STIOLTO RESPIMAT) 2.5-2.5 MCG/ACT AERS Inhale 2 puffs into the lungs daily.  . traMADol (ULTRAM) 50 MG tablet Take 1 tablet (50 mg total) by mouth every 6 (six) hours as needed for moderate pain.  . furosemide (LASIX) 20 MG tablet TAKE 1 TABLET BY MOUTH EVERY DAY  . furosemide (LASIX) 40 MG tablet  Take 1 tablet (40 mg total) by mouth daily.  . [DISCONTINUED] methylPREDNISolone (MEDROL DOSEPAK) 4 MG TBPK tablet As directed for 6 days. (Patient not taking: Reported on 05/06/2019)   Facility-Administered Encounter Medications as of 05/06/2019  Medication  . methylPREDNISolone acetate (DEPO-MEDROL) injection 20 mg  . methylPREDNISolone acetate (DEPO-MEDROL) injection 40 mg    Follow-up: follow up in 6 months  Rica Koyanagi, RN

## 2019-05-08 MED ORDER — GLIPIZIDE 5 MG PO TABS: 5.0000 mg | ORAL_TABLET | Freq: Two times a day (BID) | ORAL | 3 refills | Status: DC

## 2019-05-08 NOTE — Addendum Note (Signed)
Addended by: Clarene Reamer B on: 05/08/2019 09:30 AM   Modules accepted: Orders

## 2019-05-10 IMAGING — DX DG CHEST 1V PORT
1 series · 2 of 2 positions shown · non-contrast
Comparison: 03/19/2017

CLINICAL DATA: Shortness of breath

EXAM:
PORTABLE CHEST 1 VIEW

[Series 1: chest · 0.14mm/px · 2 of 2 slices shown]
[im 1/2]
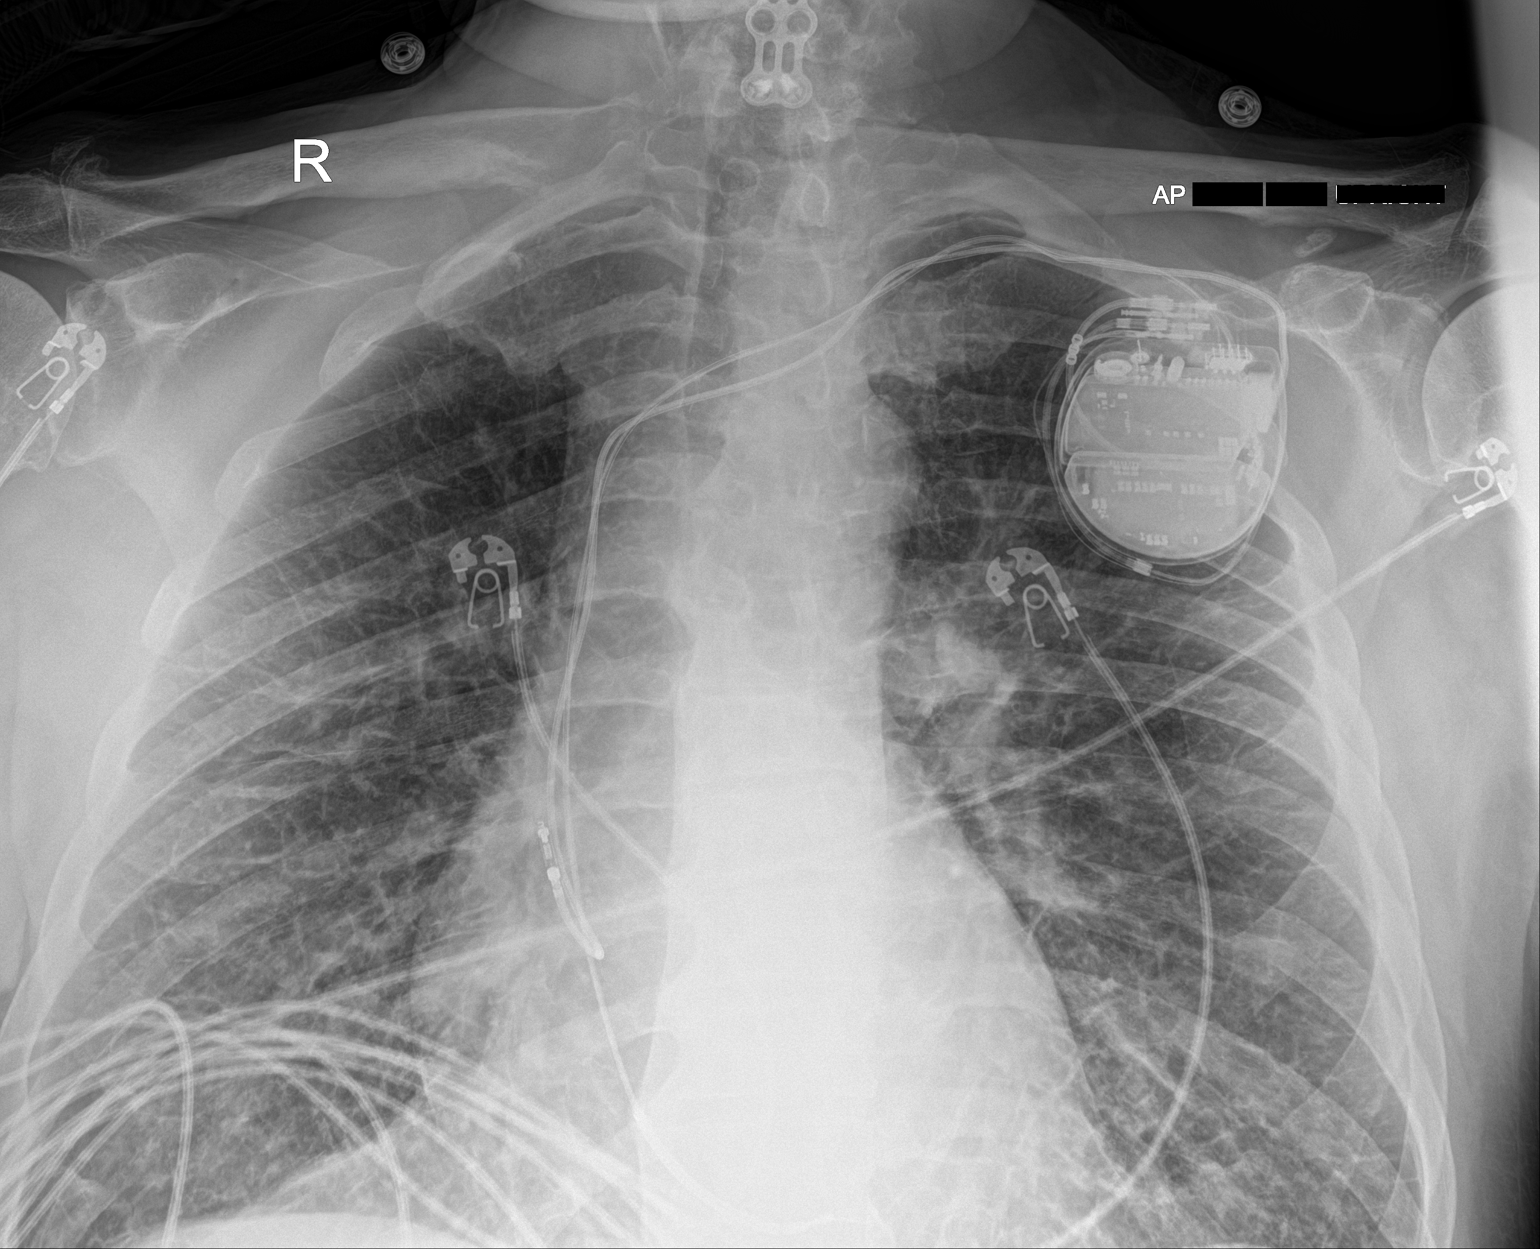
[im 2/2]
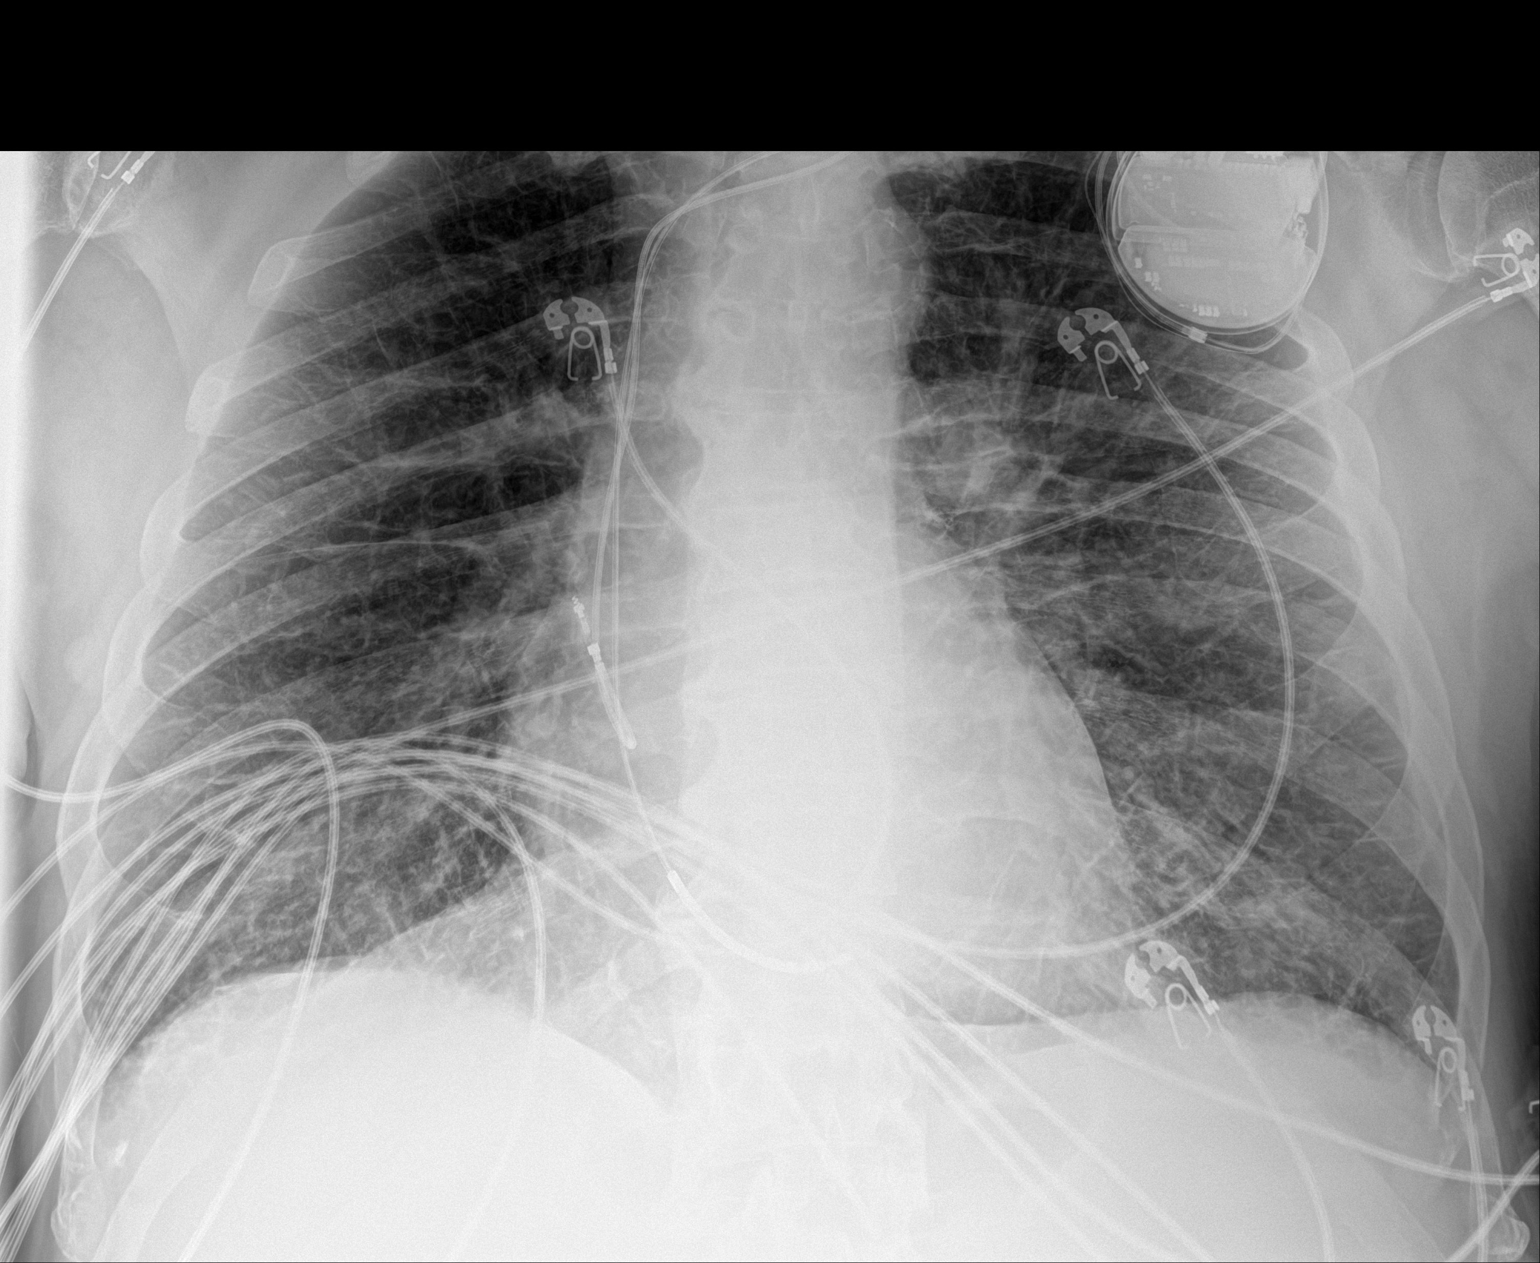

[2 of 2 positions shown; findings below may reference images not displayed]

FINDINGS: Cardiac pacemaker. Postoperative changes in the cervical spine.
Heart size and pulmonary vascularity are normal. Diffuse
interstitial pattern to the lungs has developed since previous
study, likely indicating interstitial infiltration due to edema or
pneumonia. No focal consolidation. No blunting of costophrenic
angles. No pneumothorax. Mediastinal contours appear intact.
Calcification of the aorta.
IMPRESSION: New interstitial pattern to the lungs may represent interstitial
pneumonitis or edema. Heart size is normal. No focal consolidation.

## 2019-05-11 ENCOUNTER — Other Ambulatory Visit (INDEPENDENT_AMBULATORY_CARE_PROVIDER_SITE_OTHER): Payer: Medicare HMO

## 2019-05-11 DIAGNOSIS — I959 Hypotension, unspecified: Secondary | ICD-10-CM

## 2019-05-11 DIAGNOSIS — I5022 Chronic systolic (congestive) heart failure: Secondary | ICD-10-CM | POA: Diagnosis not present

## 2019-05-11 DIAGNOSIS — E1121 Type 2 diabetes mellitus with diabetic nephropathy: Secondary | ICD-10-CM | POA: Diagnosis not present

## 2019-05-11 LAB — COMPREHENSIVE METABOLIC PANEL
ALT: 7 U/L (ref 0–53)
AST: 12 U/L (ref 0–37)
Albumin: 4.2 g/dL (ref 3.5–5.2)
Alkaline Phosphatase: 41 U/L (ref 39–117)
BUN: 38 mg/dL — ABNORMAL HIGH (ref 6–23)
CO2: 28 mEq/L (ref 19–32)
Calcium: 9.4 mg/dL (ref 8.4–10.5)
Chloride: 103 mEq/L (ref 96–112)
Creatinine, Ser: 2.08 mg/dL — ABNORMAL HIGH (ref 0.40–1.50)
GFR: 31.41 mL/min — ABNORMAL LOW (ref >60.00)
Glucose, Bld: 200 mg/dL — ABNORMAL HIGH (ref 70–99)
Potassium: 5.2 mEq/L — ABNORMAL HIGH (ref 3.5–5.1)
Sodium: 139 mEq/L (ref 135–145)
Total Bilirubin: 0.4 mg/dL (ref 0.2–1.2)
Total Protein: 6.6 g/dL (ref 6.0–8.3)

## 2019-05-18 ENCOUNTER — Telehealth: Payer: Self-pay | Admitting: Family Medicine

## 2019-05-18 NOTE — Telephone Encounter (Signed)
Patient called back today to let you know that he has been seen @ [facility].

## 2019-05-18 NOTE — Telephone Encounter (Signed)
Thanks. Will request records.

## 2019-05-22 ENCOUNTER — Other Ambulatory Visit: Payer: Self-pay | Admitting: Internal Medicine

## 2019-06-01 NOTE — Progress Notes (Signed)
No ICM remote transmission received for 05/25/2019 and next ICM transmission scheduled for 07/03/2019.

## 2019-06-06 ENCOUNTER — Other Ambulatory Visit: Payer: Self-pay | Admitting: Family Medicine

## 2019-06-08 NOTE — Telephone Encounter (Signed)
DG-Plz see refill req/thx dmf 

## 2019-06-09 NOTE — Progress Notes (Signed)
Electrophysiology Office Note Date: 06/10/2019  ID:  Randy [name], DOB 10-02-1945, MRN [id number]  PCP: Elby Beck, FNP Primary Cardiologist: No primary care provider on file. Electrophysiologist: Dr. Lovena Le  CC: Routine ICD follow-up  Randy [name] is a 73 y.o. male seen today for Dr. Lovena Le.  They present today to discuss Barostim therapy. Since last being seen in our clinic, the patient reports doing well overall. He is compliant with his medications.  He is occasionally SOB with his ADLs, including walking around a store, especially on an incline or stairs. He denies chest pain, palpitations, PND, orthopnea, nausea, vomiting, dizziness, syncope, edema, weight gain, or early satiety.  He has not had ICD shocks.   Device History: St. Jude Dual Chamber ICD implanted 10/30/4740 for systolic CHF History of appropriate therapy: No History of AAD therapy: Yes, on amiodarone   Past Medical History:  Diagnosis Date  . AICD (automatic cardioverter/defibrillator) present 10/18/2016   "replaced 2009-St. Jude device, Dr Lovena Le"  . Anemia   . Arthritis    "back" (08/27/2017)  . CAD (coronary artery disease)   . CHF (congestive heart failure) ([place])   . CKD (chronic kidney disease) stage 3, GFR 30-59 ml/min    [name]  . Diverticulosis   . Dyslipidemia   . GERD (gastroesophageal reflux disease)   . Hypertension   . Neuromuscular disorder (HCC)    neuropathy- in hands  . Osteoarthritis    hips, hands  . Pacemaker   . Shortness of breath   . Sleep apnea    started a sleep study but didn't completed, states PCP told him he has sleep  apnea, doesn't use CPAP  . Systolic dysfunction   . Type 2 diabetes with nephropathy ([place])   . Vitamin D deficiency    Past Surgical History:  Procedure Laterality Date  . ANTERIOR CERVICAL DECOMP/DISCECTOMY FUSION    . BACK SURGERY    . CARPAL TUNNEL RELEASE Left   . CORONARY ARTERY BYPASS GRAFT N/A 09/02/2017   Procedure: CORONARY  ARTERY BYPASS GRAFTING (CABG) x three , using left internal mammary artery and right leg greater saphenous vein harvested endoscopically;  Surgeon: [name], MD;  Location: [facility];  Service: Open Heart Surgery;  Laterality: N/A;  . CYST EXCISION     "back"  . ICD IMPLANT N/A 10/18/2016   Procedure: ICD Implant- Upgrade to Dual Chamber;  Surgeon: Evans Lance, MD;  Location: [facility];  Service: Cardiovascular;  Laterality: N/A;  . INSERT / REPLACE / REMOVE PACEMAKER    . JOINT REPLACEMENT    . LEFT HEART CATH AND CORONARY ANGIOGRAPHY N/A 08/30/2017   Procedure: LEFT HEART CATH AND CORONARY ANGIOGRAPHY;  Surgeon: Martinique, Peter M, MD;  Location: [facility];  Service: Cardiovascular;  Laterality: N/A;  . LUMBAR LAMINECTOMY Left 07/17/2013   Procedure: MICRODISCECTOMY LUMBAR LAMINECTOMY;  Surgeon: Marybelle Killings, MD;  Location: [facility];  Service: Orthopedics;  Laterality: Left;  Left L4 Laminotomy, Lateral Recess Decompression, Cyst Excision  . SHOULDER OPEN ROTATOR CUFF REPAIR Left   . TEE WITHOUT CARDIOVERSION N/A 09/02/2017   Procedure: TRANSESOPHAGEAL ECHOCARDIOGRAM (TEE);  Surgeon: [name], MD;  Location: [facility];  Service: Open Heart Surgery;  Laterality: N/A;  . TOTAL HIP ARTHROPLASTY Left 09/17/2012   Procedure: Left TOTAL HIP ARTHROPLASTY ANTERIOR APPROACH;  Surgeon: Marybelle Killings, MD;  Location: [facility];  Service: Orthopedics;  Laterality: Left;    Current Outpatient Medications  Medication Sig Dispense Refill  . Accu-Chek Softclix Lancets lancets USE AS DIRECTED  TO TEST BLOOD SUGAR ONE TIME DAILY  AND AS NEEDED 200 each 2  . Alcohol Swabs (B-D SINGLE USE SWABS REGULAR) PADS Use as instructed to clean area for glucose monitoring once daily and as needed.  Diagnosis:  E11.21  Non insulin-dependent 100 each 3  . amiodarone (PACERONE) 200 MG tablet Take one tablet by mouth daily Monday through Saturday.  Do NOT take on Sunday,. 90 tablet 3  . aspirin EC 325 MG tablet Take  1 tablet (325 mg total) by mouth daily.    Marland Kitchen atorvastatin (LIPITOR) 80 MG tablet Take 1 tablet (80 mg total) by mouth daily at 6 PM. 90 tablet 1  . Blood Glucose Calibration (ACCU-CHEK AVIVA) SOLN Use to calibrate blood glucose machine as recommended.  Diagnosis:  E11.21  Non-insulin dependent. 1 each 2  . Blood Glucose Monitoring Suppl (ACCU-CHEK AVIVA PLUS) w/Device KIT USE TO CHECK BLOOD SUGAR ONCE DAILY. NEED MD APPOINTMENT 1 kit 0  . carvedilol (COREG) 3.125 MG tablet TAKE 1 TABLET TWICE DAILY 180 tablet 3  . finasteride (PROSCAR) 5 MG tablet TAKE 1 TABLET EVERY DAY 90 tablet 1  . furosemide (LASIX) 20 MG tablet TAKE 1 TABLET BY MOUTH EVERY DAY 90 tablet 2  . gabapentin (NEURONTIN) 300 MG capsule Take 2 capsules (600 mg total) by mouth 2 (two) times daily. 360 capsule 2  . glipiZIDE (GLUCOTROL) 5 MG tablet Take 1 tablet (5 mg total) by mouth 2 (two) times daily before a meal. 180 tablet 3  . glucose blood (ACCU-CHEK AVIVA PLUS) test strip Test Blood Sugar 1 time daily: Dx:E11.27 100 each 3  . sacubitril-valsartan (ENTRESTO) 24-26 MG Take 1 tablet by mouth 2 (two) times daily. Please make yearly appt with Dr. Lovena Le for October for future refills. 1st attempt 180 tablet 0  . tamsulosin (FLOMAX) 0.4 MG CAPS capsule TAKE 1 CAPSULE EVERY DAY 90 capsule 2  . Tiotropium Bromide-Olodaterol (STIOLTO RESPIMAT) 2.5-2.5 MCG/ACT AERS Inhale 2 puffs into the lungs daily. 1 Inhaler 3  . traMADol (ULTRAM) 50 MG tablet Take 1 tablet (50 mg total) by mouth every 6 (six) hours as needed for moderate pain. 30 tablet 0   No current facility-administered medications for this visit.     Allergies:   Patient has no known allergies.   Social History: Social History   Socioeconomic History  . Marital status: Divorced    Spouse name: Not on file  . Number of children: 2  . Years of education: Not on file  . Highest education level: Not on file  Occupational History  . 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 Financial resource strain: Not on file  . Food insecurity    Worry: Not on file    Inability: Not on file  . Transportation needs    Medical: Not on file    Non-medical: Not on file  Tobacco Use  . Smoking status: Former Smoker    Packs/day: 2.00    Years: 17.00    Pack years: 34.00    Types: Cigarettes    Quit date: 07/30/1984    Years since quitting: 34.8  . Smokeless tobacco: Never Used  Substance and Sexual Activity  . Alcohol use: Yes    Comment: 08/27/2017 "probably have had 6 beers in the last 6 months; drink when I play golf"  . Drug use:  No  . Sexual activity: Never  Lifestyle  . Physical activity    Days per week: Not on file    Minutes per session: Not on file  . Stress: Not on file  Relationships  . Social Herbalist on phone: Not on file    Gets together: Not on file    Attends religious service: Not on file    Active member of club or organization: Not on file    Attends meetings of clubs or organizations: Not on file    Relationship status: Not on file  . Intimate partner violence    Fear of current or ex partner: Not on file    Emotionally abused: Not on file    Physically abused: Not on file    Forced sexual activity: Not on file  Other Topics Concern  . Not on file  Social History Narrative   Does not have a living will.   Desires CPR, does want life support if not futile.       Family History: Family History  Problem Relation Age of Onset  . Heart attack Father 38  . Diabetes Brother     Review of Systems: All other systems reviewed and are otherwise negative except as noted above.   Physical Exam: Vitals:   06/10/19 0944  BP: 94/74  Pulse: (!) 58  Weight: 204 lb (92.5 kg)  Height: _0  (1.727 m)    GEN- The patient is well appearing, alert and oriented x 3 today.   HEENT: normocephalic, atraumatic; sclera clear, conjunctiva pink; hearing intact; oropharynx clear; neck  supple, no JVP Lymph- no cervical lymphadenopathy Lungs- Clear to ausculation bilaterally, normal work of breathing.  No wheezes, rales, rhonchi Heart- Regular rate and rhythm, no murmurs, rubs or gallops, PMI not laterally displaced GI- soft, non-tender, non-distended, bowel sounds present, no hepatosplenomegaly Extremities- no clubbing, cyanosis, or edema; DP/PT/radial pulses 2+ bilaterally MS- no significant deformity or atrophy Skin- warm and dry, no rash or lesion; ICD pocket well healed Psych- euthymic mood, full affect Neuro- strength and sensation are intact  ICD interrogation- Most recent interrogation reviewed.   EKG:  EKG is not ordered today. The ekg ordered 04/28/2019 shows NSR 69 bpm, QRS 96 ms.  Recent Labs: 06/16/2018: TSH 0.53 05/06/2019: Hemoglobin 10.8; Platelets 180.0 05/11/2019: ALT 7; BUN 38; Creatinine, Ser 2.08; Potassium 5.2 No hemolysis seen; Sodium 139   Wt Readings from Last 3 Encounters:  06/10/19 204 lb (92.5 kg)  05/06/19 204 lb 1.9 oz (92.6 kg)  04/28/19 203 lb 3.2 oz (92.2 kg)     Other studies Reviewed: Additional studies/ records that were reviewed today include: Most recent Echo, most recent labs, Previous EP office notes,    Assessment and Plan:  1.  Chronic systolic dysfunction s/p St. Jude dual chamber ICD  euvolemic today Stable on an appropriate medical regimen Normal ICD function See Pace Art report No changes today  2. Barostim consideration BMI 31 QRS 96 bpm NYHA III symptoms, as he is occasionally symptomatic with ADLs.  EF 25-30% 07/2017, but TEE 08/2017 showed LVEF 35-40%. Need to clarify. Will repeat Echo pending ProBNP Coverage: [employer] Pt is very interested in [place], We will check labs today, and if appropriate, will continue work-up, first by clarifying his EF.   Current medicines are reviewed at length with the patient today.   The patient does not have concerns regarding his medicines.  The following changes  were made today:  none  Labs/ tests ordered today include:  Orders Placed This Encounter  Procedures  . Basic metabolic panel  . Pro b natriuretic peptide   Disposition:   Follow up with Dr. Lovena Le as scheduled.  Barostim follow up pending labs and further work up.   Jacalyn Lefevre, PA-C  06/10/2019 9:54 AM  [facility] [address] [place] [address] [phone] (office) [phone] (fax)

## 2019-06-10 ENCOUNTER — Ambulatory Visit: Payer: Medicare HMO | Admitting: Student

## 2019-06-10 ENCOUNTER — Encounter (INDEPENDENT_AMBULATORY_CARE_PROVIDER_SITE_OTHER): Payer: Self-pay

## 2019-06-10 ENCOUNTER — Other Ambulatory Visit: Payer: Self-pay

## 2019-06-10 VITALS — BP 94/74 | HR 58 | Ht 68.0 in | Wt 204.0 lb

## 2019-06-10 DIAGNOSIS — I5022 Chronic systolic (congestive) heart failure: Secondary | ICD-10-CM

## 2019-06-10 LAB — BASIC METABOLIC PANEL
BUN/Creatinine Ratio: 16 (ref 10–24)
BUN: 42 mg/dL — ABNORMAL HIGH (ref 8–27)
CO2: 22 mmol/L (ref 20–29)
Calcium: 9.1 mg/dL (ref 8.6–10.2)
Chloride: 101 mmol/L (ref 96–106)
Creatinine, Ser: 2.65 mg/dL — ABNORMAL HIGH (ref 0.76–1.27)
GFR calc Af Amer: 26 mL/min/{1.73_m2} — ABNORMAL LOW (ref >59)
GFR calc non Af Amer: 23 mL/min/{1.73_m2} — ABNORMAL LOW (ref >59)
Glucose: 120 mg/dL — ABNORMAL HIGH (ref 65–99)
Potassium: 5.1 mmol/L (ref 3.5–5.2)
Sodium: 139 mmol/L (ref 134–144)

## 2019-06-10 LAB — PRO B NATRIURETIC PEPTIDE: NT-Pro BNP: 2202 pg/mL — ABNORMAL HIGH (ref 0–376)

## 2019-06-10 NOTE — Patient Instructions (Signed)
Medication Instructions:  Your physician recommends that you continue on your current medications as directed. Please refer to the Current Medication list given to you today.   *If you need a refill on your cardiac medications before your next appointment, please call your pharmacy*  Lab Work:  BMET  PRO BNP    If you have labs (blood work) drawn today and your tests are completely normal, you will receive your results only by: Marland Kitchen MyChart Message (if you have MyChart) OR . A paper copy in the mail If you have any lab test that is abnormal or we need to change your treatment, we will call you to review the results.  Testing/Procedures: NONE ORDERED  TODAY    Follow-Up: At [facility], you and your health needs are our priority.  As part of our continuing mission to provide you with exceptional heart care, we have created designated Provider Care Teams.  These Care Teams include your primary Cardiologist (physician) and Advanced Practice Providers (APPs -  Physician Assistants and Nurse Practitioners) who all work together to provide you with the care you need, when you need it.  Your next appointment:  AS SCHEDULED   Other Instructions

## 2019-06-12 ENCOUNTER — Other Ambulatory Visit: Payer: Self-pay | Admitting: *Deleted

## 2019-06-12 DIAGNOSIS — N183 Chronic kidney disease, stage 3 unspecified: Secondary | ICD-10-CM

## 2019-07-02 ENCOUNTER — Ambulatory Visit (INDEPENDENT_AMBULATORY_CARE_PROVIDER_SITE_OTHER): Payer: Medicare HMO | Admitting: *Deleted

## 2019-07-02 DIAGNOSIS — I5022 Chronic systolic (congestive) heart failure: Secondary | ICD-10-CM

## 2019-07-02 LAB — CUP PACEART REMOTE DEVICE CHECK
Battery Remaining Longevity: 71 mo
Battery Remaining Percentage: 72 %
Battery Voltage: 2.96 V
Brady Statistic AP VP Percent: 1 %
Brady Statistic AP VS Percent: 1.2 %
Brady Statistic AS VP Percent: 1 %
Brady Statistic AS VS Percent: 98 %
Brady Statistic RA Percent Paced: 1 %
Brady Statistic RV Percent Paced: 1 %
Date Time Interrogation Session: 20201203040015
HighPow Impedance: 72 Ohm
HighPow Impedance: 72 Ohm
Implantable Lead Implant Date: 20091022
Implantable Lead Implant Date: 20180322
Implantable Lead Location: 753859
Implantable Lead Location: 753860
Implantable Lead Model: 7121
Implantable Pulse Generator Implant Date: 20180322
Lead Channel Impedance Value: 1200 Ohm
Lead Channel Impedance Value: 300 Ohm
Lead Channel Pacing Threshold Amplitude: 0.5 V
Lead Channel Pacing Threshold Amplitude: 1.25 V
Lead Channel Pacing Threshold Pulse Width: 0.5 ms
Lead Channel Pacing Threshold Pulse Width: 0.8 ms
Lead Channel Sensing Intrinsic Amplitude: 1.5 mV
Lead Channel Sensing Intrinsic Amplitude: 12 mV
Lead Channel Setting Pacing Amplitude: 2 V
Lead Channel Setting Pacing Amplitude: 2.5 V
Lead Channel Setting Pacing Pulse Width: 0.8 ms
Lead Channel Setting Sensing Sensitivity: 0.5 mV
Pulse Gen Serial Number: 7413245

## 2019-07-03 ENCOUNTER — Telehealth: Payer: Self-pay

## 2019-07-03 ENCOUNTER — Ambulatory Visit (INDEPENDENT_AMBULATORY_CARE_PROVIDER_SITE_OTHER): Payer: Medicare HMO

## 2019-07-03 DIAGNOSIS — Z95810 Presence of automatic (implantable) cardiac defibrillator: Secondary | ICD-10-CM | POA: Diagnosis not present

## 2019-07-03 DIAGNOSIS — I5022 Chronic systolic (congestive) heart failure: Secondary | ICD-10-CM | POA: Diagnosis not present

## 2019-07-03 NOTE — Telephone Encounter (Signed)
Remote ICM transmission received.  Attempted call to patient regarding ICM remote transmission and left detailed message per DPR.  Advised to return call for any fluid symptoms or questions. Next ICM remote transmission scheduled 08/03/2019.     

## 2019-07-03 NOTE — Progress Notes (Signed)
EPIC Encounter for ICM Monitoring  Patient Name: Randy [name] is a 73 y.o. male Date: 07/03/2019 Primary Care Physican: Elby Beck, FNP Primary Cardiologist:Taylor Electrophysiologist:Taylor Last Weight: 198lbs baseline   Attempted call to patient and unable to reach.  Left detailed message per DPR regarding transmission. Transmission reviewed.   Corvue thoracic impedancenormal.  Prescribed: Furosemide40 mg 1 tablet daily  Labs: 06/16/2018 Creatinine 2.36, BUN 47, Potassium 5.4, Sodium 141, GFR 28.93 04/04/2018 Creatinine 2.3 BUN 50, Potassium 5.0, Sodium 140 A complete set of results can be found in Results Review.  Recommendations: Left voice mail with ICM number and encouraged to call if experiencing any fluid symptoms.  Follow-up plan: ICM clinic phone appointment on 08/03/2019.    Copy of ICM check sent to Dr. Lovena Le.   3 month ICM trend: 07/02/2019    1 Year ICM trend:       Rosalene Billings, RN 07/03/2019 5:10 PM

## 2019-07-04 IMAGING — CR DG CHEST 2V
2 series · 2 of 2 positions shown · non-contrast
Comparison: 09/07/2017

CLINICAL DATA: CABG

EXAM:
CHEST - 2 VIEW

[w chest pa]
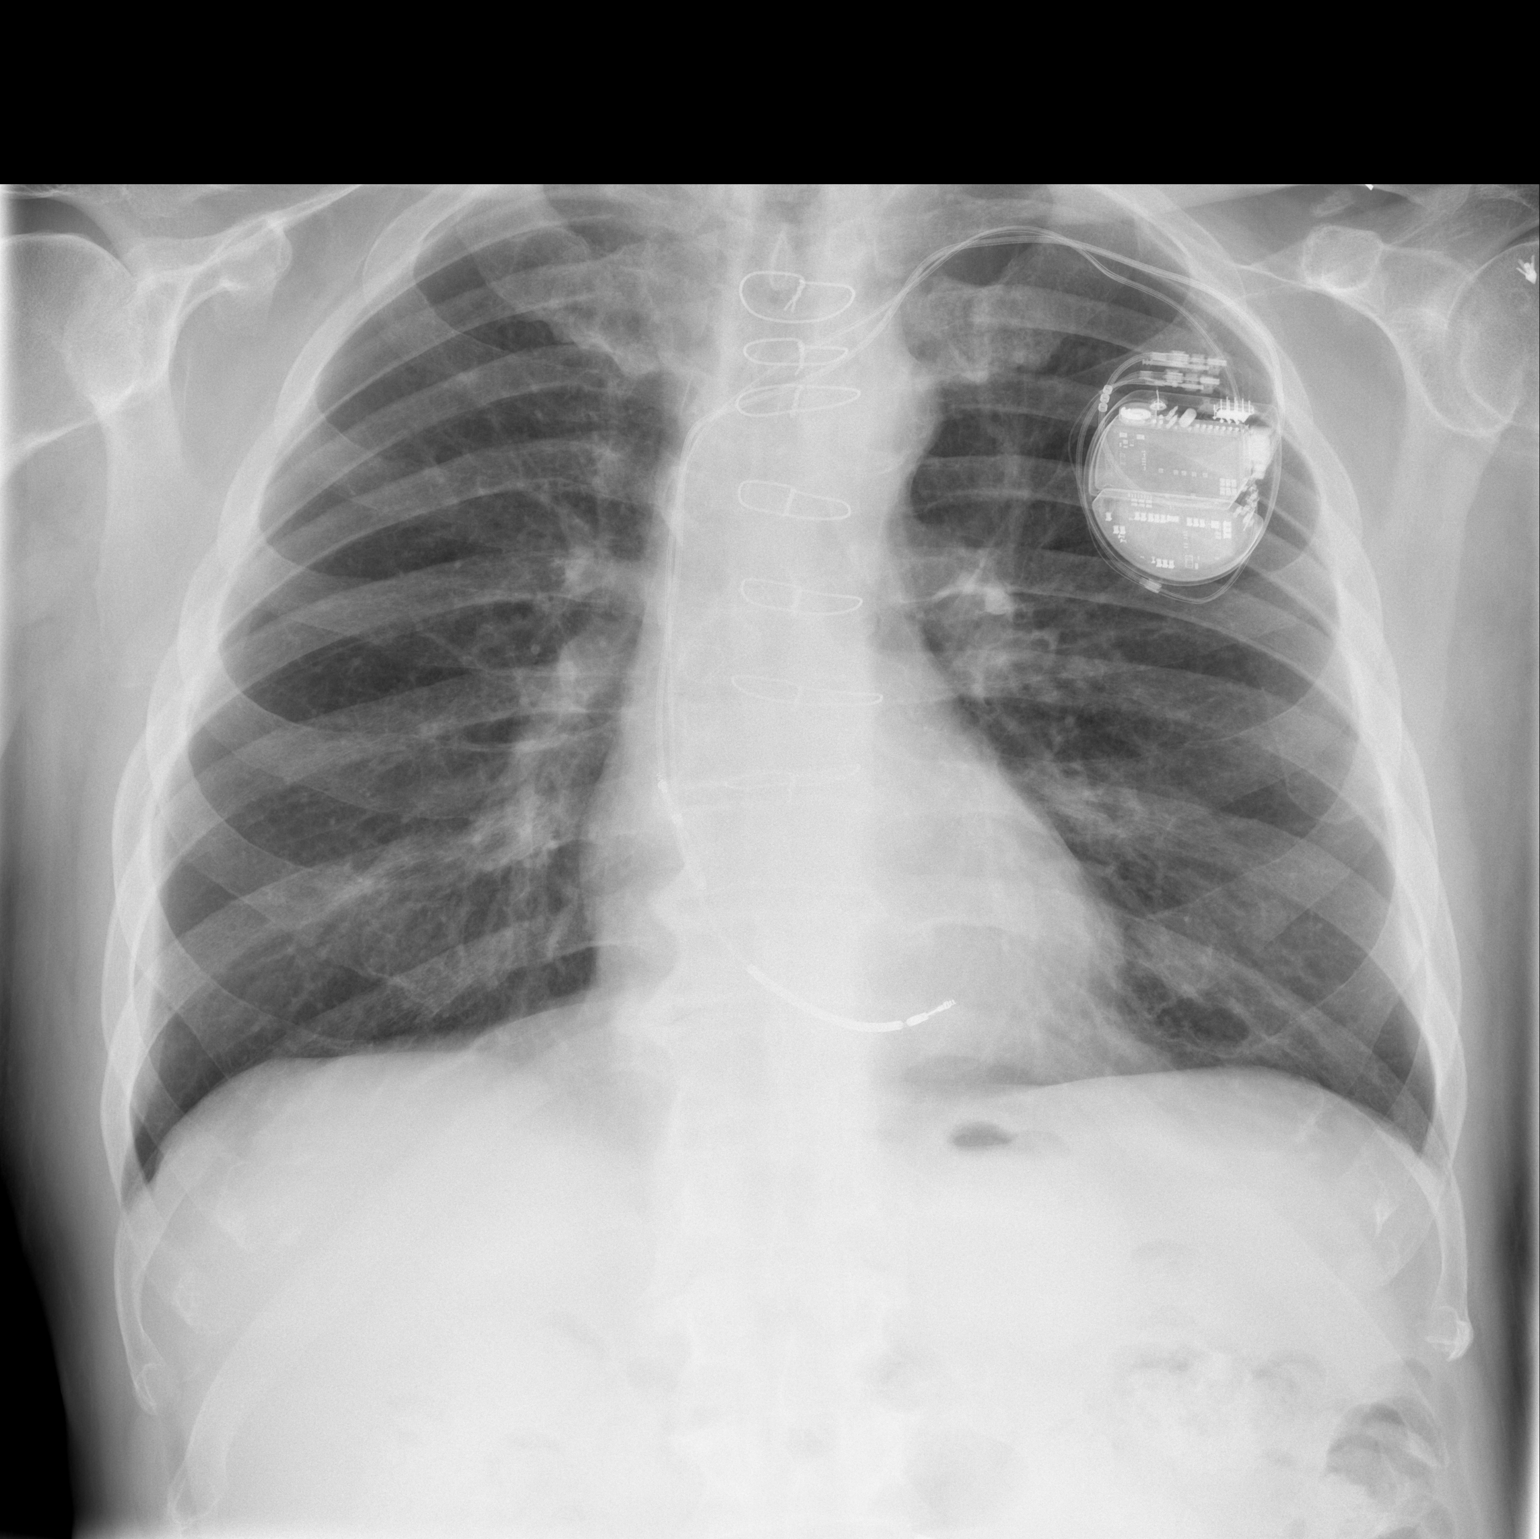

[w chest lat *]
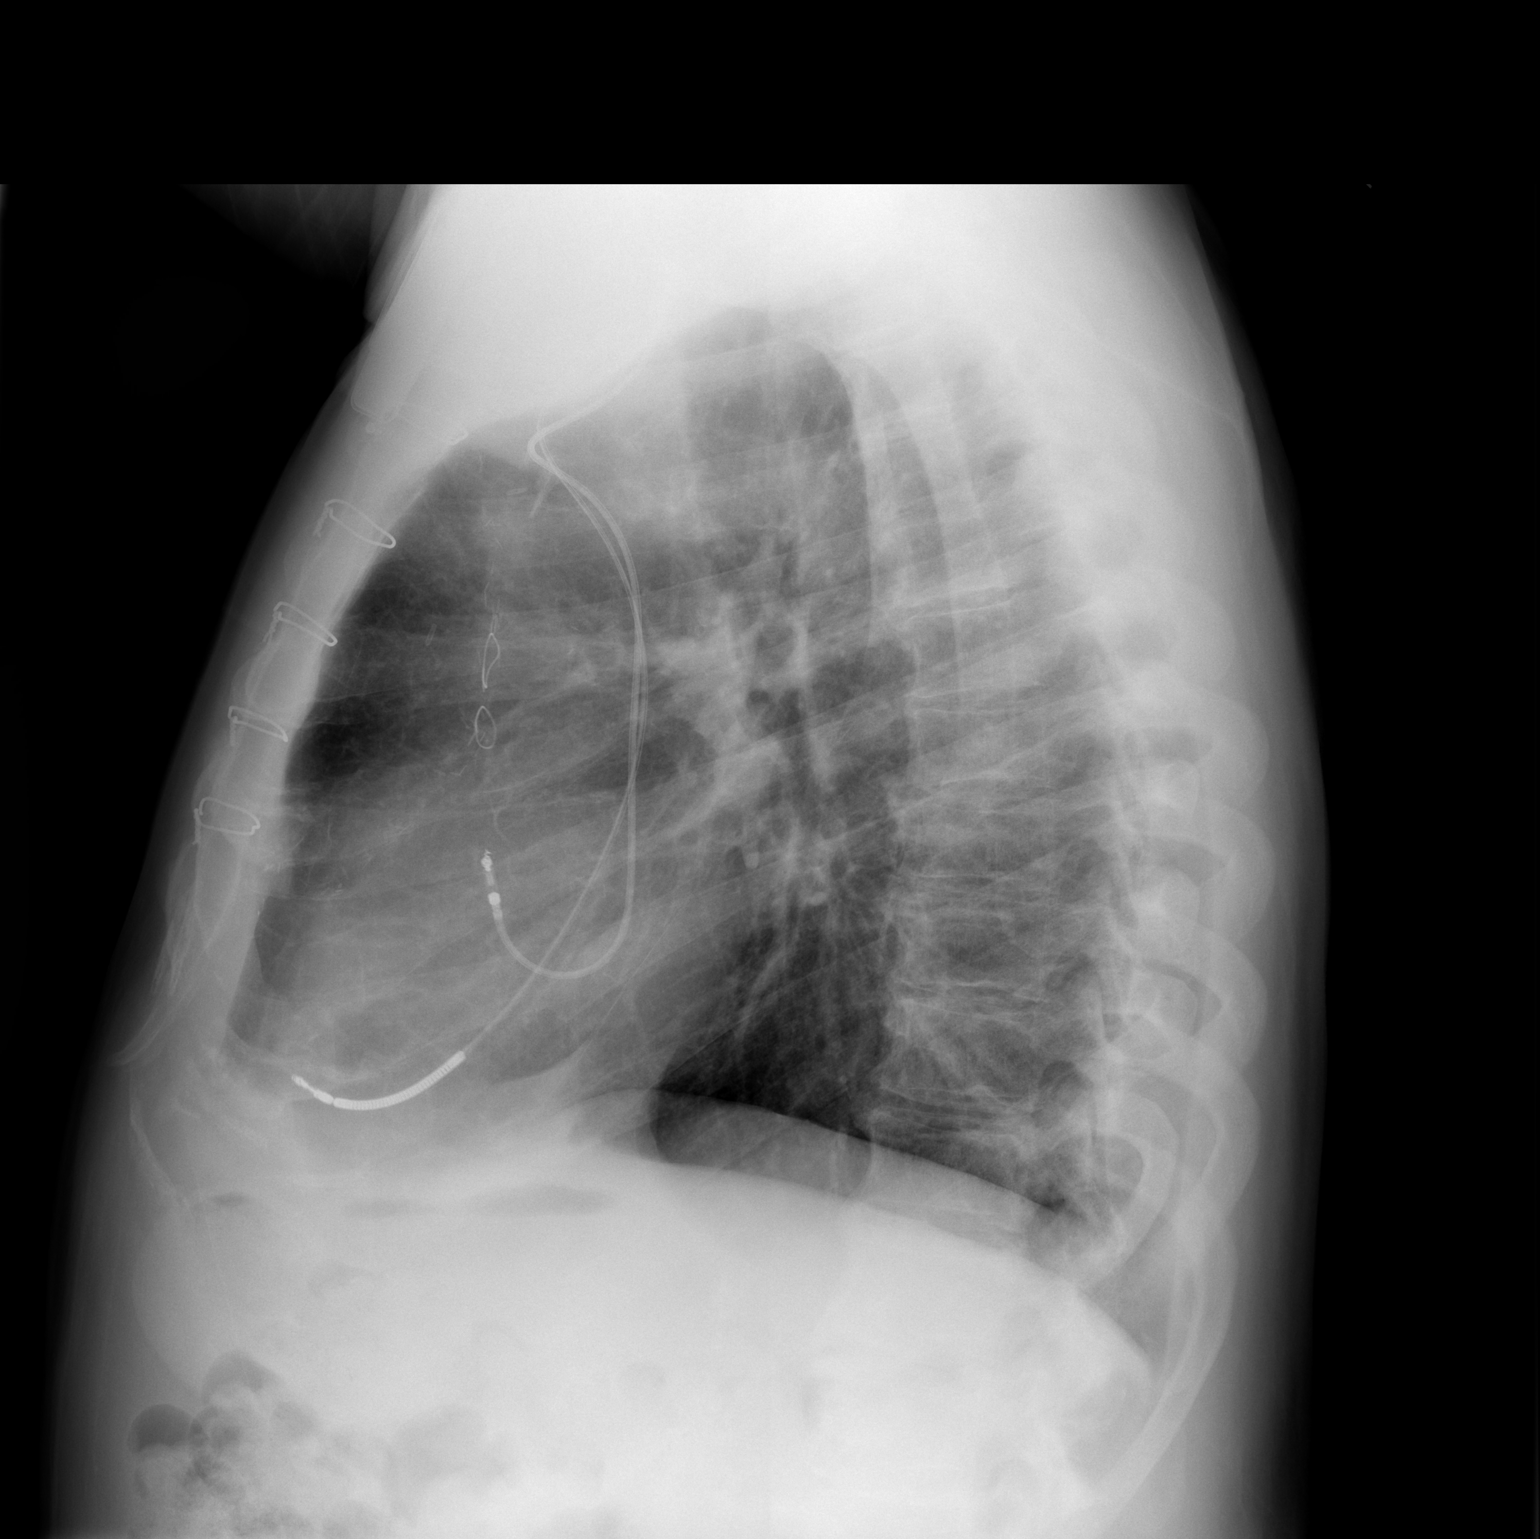

[2 of 2 positions shown; findings below may reference images not displayed]

FINDINGS: Prior CABG. Left AICD is unchanged. Heart is normal size. No
confluent airspace opacities or effusions. No acute bony
abnormality.
IMPRESSION: No active cardiopulmonary disease.

## 2019-07-14 DIAGNOSIS — H5203 Hypermetropia, bilateral: Secondary | ICD-10-CM | POA: Diagnosis not present

## 2019-07-14 DIAGNOSIS — E119 Type 2 diabetes mellitus without complications: Secondary | ICD-10-CM | POA: Diagnosis not present

## 2019-07-14 DIAGNOSIS — H524 Presbyopia: Secondary | ICD-10-CM | POA: Diagnosis not present

## 2019-07-14 DIAGNOSIS — H25093 Other age-related incipient cataract, bilateral: Secondary | ICD-10-CM | POA: Diagnosis not present

## 2019-07-14 LAB — HM DIABETES EYE EXAM

## 2019-07-18 ENCOUNTER — Other Ambulatory Visit: Payer: Self-pay | Admitting: Internal Medicine

## 2019-07-27 ENCOUNTER — Other Ambulatory Visit: Payer: Self-pay | Admitting: Family Medicine

## 2019-07-27 DIAGNOSIS — N1831 Chronic kidney disease, stage 3a: Secondary | ICD-10-CM

## 2019-07-27 DIAGNOSIS — I10 Essential (primary) hypertension: Secondary | ICD-10-CM

## 2019-07-27 DIAGNOSIS — E7841 Elevated Lipoprotein(a): Secondary | ICD-10-CM

## 2019-07-27 DIAGNOSIS — E1121 Type 2 diabetes mellitus with diabetic nephropathy: Secondary | ICD-10-CM

## 2019-07-27 NOTE — Progress Notes (Signed)
Follow up lab orders placed

## 2019-07-28 NOTE — Progress Notes (Signed)
ICD remote 

## 2019-08-01 ENCOUNTER — Other Ambulatory Visit: Payer: Self-pay | Admitting: Internal Medicine

## 2019-08-03 ENCOUNTER — Other Ambulatory Visit: Payer: Medicare HMO

## 2019-08-05 ENCOUNTER — Other Ambulatory Visit: Payer: Self-pay

## 2019-08-05 ENCOUNTER — Other Ambulatory Visit (INDEPENDENT_AMBULATORY_CARE_PROVIDER_SITE_OTHER): Payer: Medicare HMO

## 2019-08-05 DIAGNOSIS — N1831 Chronic kidney disease, stage 3a: Secondary | ICD-10-CM

## 2019-08-05 DIAGNOSIS — I10 Essential (primary) hypertension: Secondary | ICD-10-CM

## 2019-08-05 DIAGNOSIS — E1121 Type 2 diabetes mellitus with diabetic nephropathy: Secondary | ICD-10-CM | POA: Diagnosis not present

## 2019-08-05 DIAGNOSIS — E7841 Elevated Lipoprotein(a): Secondary | ICD-10-CM

## 2019-08-05 LAB — LIPID PANEL
Cholesterol: 181 mg/dL (ref 0–200)
HDL: 27.4 mg/dL — ABNORMAL LOW (ref >39.00)
Total CHOL/HDL Ratio: 7
Triglycerides: 415 mg/dL — ABNORMAL HIGH (ref 0.0–149.0)

## 2019-08-05 LAB — BASIC METABOLIC PANEL
BUN: 39 mg/dL — ABNORMAL HIGH (ref 6–23)
CO2: 28 mEq/L (ref 19–32)
Calcium: 9.1 mg/dL (ref 8.4–10.5)
Chloride: 101 mEq/L (ref 96–112)
Creatinine, Ser: 1.94 mg/dL — ABNORMAL HIGH (ref 0.40–1.50)
GFR: 34.02 mL/min — ABNORMAL LOW (ref >60.00)
Glucose, Bld: 147 mg/dL — ABNORMAL HIGH (ref 70–99)
Potassium: 5.1 mEq/L (ref 3.5–5.1)
Sodium: 137 mEq/L (ref 135–145)

## 2019-08-05 LAB — LDL CHOLESTEROL, DIRECT: Direct LDL: 101 mg/dL

## 2019-08-05 LAB — MICROALBUMIN / CREATININE URINE RATIO
Creatinine,U: 49.8 mg/dL
Microalb Creat Ratio: 4 mg/g (ref 0.0–30.0)
Microalb, Ur: 2 mg/dL — ABNORMAL HIGH (ref 0.0–1.9)

## 2019-08-05 LAB — HEMOGLOBIN A1C: Hgb A1c MFr Bld: 8 % — ABNORMAL HIGH (ref 4.6–6.5)

## 2019-08-05 NOTE — Addendum Note (Signed)
Addended by: Cloyd Stagers on: 08/05/2019 09:36 AM   Modules accepted: Orders

## 2019-08-10 LAB — CUP PACEART INCLINIC DEVICE CHECK
Date Time Interrogation Session: 20200929160451
Implantable Lead Implant Date: 20091022
Implantable Lead Implant Date: 20180322
Implantable Lead Location: 753859
Implantable Lead Location: 753860
Implantable Lead Model: 7121
Implantable Pulse Generator Implant Date: 20180322
Pulse Gen Serial Number: 7413245

## 2019-08-11 ENCOUNTER — Ambulatory Visit (INDEPENDENT_AMBULATORY_CARE_PROVIDER_SITE_OTHER): Payer: Medicare HMO

## 2019-08-11 ENCOUNTER — Telehealth: Payer: Self-pay

## 2019-08-11 DIAGNOSIS — I5022 Chronic systolic (congestive) heart failure: Secondary | ICD-10-CM

## 2019-08-11 DIAGNOSIS — Z95810 Presence of automatic (implantable) cardiac defibrillator: Secondary | ICD-10-CM | POA: Diagnosis not present

## 2019-08-11 NOTE — Progress Notes (Signed)
EPIC Encounter for ICM Monitoring  Patient Name: KEAGIN ARNTZEN is a 74 y.o. male Date: 08/11/2019 Primary Care Physican: Elby Beck, FNP Primary Cardiologist:Taylor Electrophysiologist:Taylor Last Weight: 198lbs baseline   Attempted call to patient and unable to reach.  Left detailed message per DPR regarding transmission. Transmission reviewed.   Corvue thoracic impedancesuggesting possible ongoing fluid accumulation since 08/03/2019.  Prescribed: Furosemide40 mg 1 tablet daily  Labs: 08/05/2019 Creatinine 1.94, BUN 39, Potassium 5.1, Sodium 137, GFR 34.02 06/10/2019 Creatinine 2.65, BUN 42, Potassium 5.1, Sodium 139 A complete set of results can be found in Results Review.  Recommendations: Left voice mail with ICM number and encouraged to call if experiencing any fluid symptoms.  Follow-up plan: ICM clinic phone appointment on 08/19/2019 to recheck fluid levels.    Copy of ICM check sent to Dr. Lovena Le.    3 month ICM trend: 08/11/2019    1 Year ICM trend:       Rosalene Billings, RN 08/11/2019 2:59 PM

## 2019-08-11 NOTE — Telephone Encounter (Signed)
Remote ICM transmission received.  Attempted call to patient regarding ICM remote transmission and left detailed message per DPR to return call.  Advised to return call for any fluid symptoms or questions.      

## 2019-08-19 ENCOUNTER — Ambulatory Visit (INDEPENDENT_AMBULATORY_CARE_PROVIDER_SITE_OTHER): Payer: Medicare HMO

## 2019-08-19 DIAGNOSIS — Z95810 Presence of automatic (implantable) cardiac defibrillator: Secondary | ICD-10-CM

## 2019-08-19 DIAGNOSIS — I5022 Chronic systolic (congestive) heart failure: Secondary | ICD-10-CM

## 2019-08-21 ENCOUNTER — Ambulatory Visit: Payer: Medicare HMO | Admitting: Family Medicine

## 2019-08-21 ENCOUNTER — Other Ambulatory Visit: Payer: Self-pay

## 2019-08-21 ENCOUNTER — Encounter: Payer: Self-pay | Admitting: Family Medicine

## 2019-08-21 VITALS — BP 90/50 | HR 48 | Temp 98.2°F | Ht 68.0 in | Wt 197.0 lb

## 2019-08-21 DIAGNOSIS — I959 Hypotension, unspecified: Secondary | ICD-10-CM

## 2019-08-21 DIAGNOSIS — E7841 Elevated Lipoprotein(a): Secondary | ICD-10-CM | POA: Diagnosis not present

## 2019-08-21 DIAGNOSIS — E1121 Type 2 diabetes mellitus with diabetic nephropathy: Secondary | ICD-10-CM | POA: Diagnosis not present

## 2019-08-21 MED ORDER — GLIPIZIDE ER 10 MG PO TB24: 10.0000 mg | ORAL_TABLET | Freq: Every day | ORAL | 3 refills | Status: DC

## 2019-08-21 NOTE — Patient Instructions (Addendum)
Good to see you today  I have sent in a new prescription for a higher dose of glipizide- you will only take once a day  Please schedule your annual wellness visit and physical exam for 3 months  COVID-19 Vaccine Information can be found at: ShippingScam.co.uk For questions related to vaccine distribution or appointments, please email [email or web address] or call 9395377402.

## 2019-08-21 NOTE — Progress Notes (Signed)
EPIC Encounter for ICM Monitoring  Patient Name: Randy [name] is a 74 y.o. male Date: 08/21/2019 Primary Care Physican: Elby Beck, FNP Primary Cardiologist:Taylor Electrophysiologist:Taylor Last Weight: 198lbs baseline   Transmission reviewed.  Corvue thoracic impedancereturned to normal since 1/12/201 remote transmission.  Prescribed: Furosemide40 mg 1 tablet daily  Labs: 08/05/2019 Creatinine 1.94, BUN 39, Potassium 5.1, Sodium 137, GFR 34.02 06/10/2019 Creatinine 2.65, BUN 42, Potassium 5.1, Sodium 139 A complete set of results can be found in Results Review.  Recommendations: None  Follow-up plan: ICM clinic phone appointment on2/15/2021 to recheck fluid levels.   Copy of ICM check sent to Dr.Taylor.    3 month ICM trend: 08/21/2019    1 Year ICM trend:       Rosalene Billings, RN 08/21/2019 4:35 PM

## 2019-08-21 NOTE — Progress Notes (Signed)
Subjective:    Patient ID: Randy [name], male    DOB: [birth year], 74 y.o.   MRN: [id number]  HPI Chief Complaint  Patient presents with  . Follow-up    No new concerns   This is a 74 yo male who presents today for follow up of chronic medical conditions.   Hyperlipidemia- LDL not at goal on atorvastatin 80 mg, he declined referral to lipid clinic.   DM type 2- hgba1c improved from 8.5 to 8.0 on glypizide 5 mg. He was taken off metformin previously ? Due to renal insufficiency. Is not interested in injectable, weekly GLP-1 agonist.   Cardiology- sees Dr. Lovena Le annually. Had visit with Barrington Ellison, [place] in EP clinic 06/10/19 to discuss Barostim therapy. Needs updated echo.      Review of Systems   Patient reports that his SOB with exertion is stable, no chest pain, no palpitations, no dizziness/lightheadedness, no leg swelling.  Objective:   Physical Exam Vitals reviewed.  Constitutional:      General: He is not in acute distress.    Appearance: Normal appearance. He is normal weight. He is not ill-appearing, toxic-appearing or diaphoretic.  HENT:     Head: Normocephalic and atraumatic.  Eyes:     Conjunctiva/sclera: Conjunctivae normal.  Cardiovascular:     Rate and Rhythm: Normal rate and regular rhythm.     Heart sounds: Normal heart sounds.  Pulmonary:     Effort: Pulmonary effort is normal.     Breath sounds: Normal breath sounds.  Musculoskeletal:     Right lower leg: No edema.     Left lower leg: No edema.  Skin:    General: Skin is warm and dry.  Neurological:     Mental Status: He is alert and oriented to person, place, and time.  Psychiatric:        Mood and Affect: Mood normal.        Behavior: Behavior normal.        Thought Content: Thought content normal.        Judgment: Judgment normal.       BP (!) 90/50 (BP Location: Right Arm, Patient Position: Sitting, Cuff Size: Normal)   Pulse (!) 48   Temp 98.2 F (36.8 C) (Temporal)   Ht 5\' 8"   (1.727 m)   Wt 197 lb (89.4 kg)   SpO2 97%   BMI 29.95 kg/m  Wt Readings from Last 3 Encounters:  08/21/19 197 lb (89.4 kg)  06/10/19 204 lb (92.5 kg)  05/06/19 204 lb 1.9 oz (92.6 kg)  Initial blood pressure in office- Left arm 70/42     Assessment & Plan:  1. Type 2 diabetes with nephropathy ([place]) - improved, not at goal, will change glipizide 5 mg po bid to glipizide XL 10 mg qd and check labs in 3 months - discussed adding Trulicity for DM and cardiac but he is not interested in injectable.  - glipiZIDE (GLUCOTROL XL) 10 MG 24 hr tablet; Take 1 tablet (10 mg total) by mouth daily with breakfast.  Dispense: 90 tablet; Refill: 3  2. Elevated lipoprotein(a) - not controlled with atorvastatin 80 mg, does not want referral to lipid clinic, will discuss with cardiology  3. Hypotension, unspecified hypotension type - he is asymptomatic but I am concerned about how low he is running and compromised perfusion to vital organs.  - will send a note to his cardiologist  This visit occurred during the SARS-CoV-2 public health emergency.  Safety protocols  were in place, including screening questions prior to the visit, additional usage of staff PPE, and extensive cleaning of exam room while observing appropriate contact time as indicated for disinfecting solutions.    Clarene Reamer, FNP-BC  [facility], [place] [facility]  08/21/2019 10:46 AM

## 2019-09-14 ENCOUNTER — Ambulatory Visit (INDEPENDENT_AMBULATORY_CARE_PROVIDER_SITE_OTHER): Payer: Medicare HMO

## 2019-09-14 DIAGNOSIS — Z95810 Presence of automatic (implantable) cardiac defibrillator: Secondary | ICD-10-CM

## 2019-09-14 DIAGNOSIS — I5022 Chronic systolic (congestive) heart failure: Secondary | ICD-10-CM

## 2019-09-18 ENCOUNTER — Telehealth: Payer: Self-pay

## 2019-09-18 DIAGNOSIS — I5022 Chronic systolic (congestive) heart failure: Secondary | ICD-10-CM | POA: Diagnosis not present

## 2019-09-18 DIAGNOSIS — Z95810 Presence of automatic (implantable) cardiac defibrillator: Secondary | ICD-10-CM | POA: Diagnosis not present

## 2019-09-18 NOTE — Progress Notes (Signed)
EPIC Encounter for ICM Monitoring  Patient Name: Randy [name] is a 74 y.o. male Date: 09/18/2019 Primary Care Physican: Elby Beck, FNP Primary Cardiologist:Taylor Electrophysiologist:Taylor Last Weight: 198lbs baseline   Attempted call to patient and unable to reach.  Left detailed message per DPR regarding transmission. Transmission reviewed.   Corvue thoracic impedancenormal.  Prescribed: Furosemide40 mg 1 tablet daily  Labs: 08/05/2019 Creatinine 1.94, BUN 39, Potassium 5.1, Sodium 137, GFR 34.02 06/10/2019 Creatinine 2.65, BUN 42, Potassium 5.1, Sodium 139 A complete set of results can be found in Results Review.  Recommendations:  Left voice mail with ICM number and encouraged to call if experiencing any fluid symptoms.  Follow-up plan: ICM clinic phone appointment on3/22/2021.    Copy of ICM check sent to Dr.Taylor.  3 month ICM trend: 2/16l/2021    1 Year ICM trend:       Rosalene Billings, RN 09/18/2019 4:11 PM

## 2019-09-18 NOTE — Telephone Encounter (Signed)
Remote ICM transmission received.  Attempted call to patient regarding ICM remote transmission and left detailed message per DPR.  Advised to return call for any fluid symptoms or questions.  

## 2019-09-28 ENCOUNTER — Telehealth: Payer: Self-pay | Admitting: Internal Medicine

## 2019-09-28 NOTE — Telephone Encounter (Signed)
Patient states he has been having stomach pain due to COVID-19 vaccination. He states he has not has any other symptoms, however he is requesting to advise with Dr. Lovena Le. Please call to discuss.

## 2019-09-28 NOTE — Telephone Encounter (Signed)
Patient states he's been having bad stomach pains since his covid shot last Wednesday 2/24. He would like a call back to discuss if he should be seen or if his medication should be adjusted.

## 2019-09-28 NOTE — Telephone Encounter (Signed)
Returned call to Pt.  Pt states he has been having stomach upset since his covid shot last week.  Pt made appt to see AP this Wednesday for stomach upset.  Advised Pt he should call his PCP to see what they suggested for stomach upset.    Pt indicates understanding.

## 2019-09-30 ENCOUNTER — Ambulatory Visit: Payer: Medicare HMO | Admitting: Student

## 2019-10-01 ENCOUNTER — Ambulatory Visit (INDEPENDENT_AMBULATORY_CARE_PROVIDER_SITE_OTHER): Payer: Medicare HMO | Admitting: *Deleted

## 2019-10-01 DIAGNOSIS — I5022 Chronic systolic (congestive) heart failure: Secondary | ICD-10-CM | POA: Diagnosis not present

## 2019-10-02 LAB — CUP PACEART REMOTE DEVICE CHECK
Battery Remaining Longevity: 68 mo
Battery Remaining Percentage: 69 %
Battery Voltage: 2.96 V
Brady Statistic AP VP Percent: 1 %
Brady Statistic AP VS Percent: 1.4 %
Brady Statistic AS VP Percent: 1 %
Brady Statistic AS VS Percent: 98 %
Brady Statistic RA Percent Paced: 1 %
Brady Statistic RV Percent Paced: 1 %
Date Time Interrogation Session: 20210305032121
HighPow Impedance: 75 Ohm
HighPow Impedance: 75 Ohm
Implantable Lead Implant Date: 20091022
Implantable Lead Implant Date: 20180322
Implantable Lead Location: 753859
Implantable Lead Location: 753860
Implantable Lead Model: 7121
Implantable Pulse Generator Implant Date: 20180322
Lead Channel Impedance Value: 1275 Ohm
Lead Channel Impedance Value: 300 Ohm
Lead Channel Pacing Threshold Amplitude: 0.5 V
Lead Channel Pacing Threshold Amplitude: 1.25 V
Lead Channel Pacing Threshold Pulse Width: 0.5 ms
Lead Channel Pacing Threshold Pulse Width: 0.8 ms
Lead Channel Sensing Intrinsic Amplitude: 1.5 mV
Lead Channel Sensing Intrinsic Amplitude: 11.7 mV
Lead Channel Setting Pacing Amplitude: 2 V
Lead Channel Setting Pacing Amplitude: 2.5 V
Lead Channel Setting Pacing Pulse Width: 0.8 ms
Lead Channel Setting Sensing Sensitivity: 0.5 mV
Pulse Gen Serial Number: 7413245

## 2019-10-19 ENCOUNTER — Telehealth: Payer: Self-pay

## 2019-10-19 ENCOUNTER — Ambulatory Visit (INDEPENDENT_AMBULATORY_CARE_PROVIDER_SITE_OTHER): Payer: Medicare HMO

## 2019-10-19 DIAGNOSIS — Z95810 Presence of automatic (implantable) cardiac defibrillator: Secondary | ICD-10-CM | POA: Diagnosis not present

## 2019-10-19 DIAGNOSIS — I5022 Chronic systolic (congestive) heart failure: Secondary | ICD-10-CM | POA: Diagnosis not present

## 2019-10-19 NOTE — Telephone Encounter (Signed)
Please call patient and have him schedule and appointment. I can not see where he has been prescribed the indomethacin in many years and I want to make examine him and determine if he needs labs.

## 2019-10-19 NOTE — Telephone Encounter (Signed)
Patient contacted the office and states he has had previous flare ups with gout and he has been prescribed Endomethacin 50mg , and this has always resolved this. He states the takes this medication as needed - and he started with a flare up in his hands this weekend and he has run out of medication, and he would like a refill.  Patient is wondering if he needs to make an appt for this, or if Jackelyn Poling can just send in a refill for him? He would like this sent in to the [facility] on [address].   Please advise.

## 2019-10-20 ENCOUNTER — Telehealth: Payer: Self-pay

## 2019-10-20 NOTE — Telephone Encounter (Signed)
Remote ICM transmission received.  Attempted call to patient regarding ICM remote transmission and left detailed message per DPR to return call.  Advised to return call for any fluid symptoms or questions.      

## 2019-10-20 NOTE — Progress Notes (Signed)
EPIC Encounter for ICM Monitoring  Patient Name: DURRELL BONFANTE is a 74 y.o. male Date: 10/20/2019 Primary Care Physican: Elby Beck, FNP Primary Cardiologist:Taylor Electrophysiologist:Taylor Last Weight: 198lbs baseline   Attempted call to patient and unable to reach.  Left detailed message per DPR regarding transmission. Transmission reviewed.   Corvue thoracic impedance suggests possible ongoing fluid accumulation since 10/16/2019.  Prescribed: Furosemide40 mg 1 tablet daily  Labs: 08/05/2019 Creatinine 1.94, BUN 39, Potassium 5.1, Sodium 137, GFR 34.02 06/10/2019 Creatinine 2.65, BUN 42, Potassium 5.1, Sodium 139 A complete set of results can be found in Results Review.  Recommendations: Left voice mail with ICM number and encouraged to call if experiencing any fluid symptoms.  Follow-up plan: ICM clinic phone appointment on3/30/2021.  91 day device clinic remote transmission scheduled for 12/31/2019.  Copy of ICM check sent to Dr.Taylor.  3 month ICM trend: 10/19/2019    1 Year ICM trend:       Rosalene Billings, RN 10/20/2019 10:16 AM

## 2019-10-20 NOTE — Telephone Encounter (Signed)
Left message asking pt to call office  °

## 2019-10-20 NOTE — Telephone Encounter (Signed)
Appointment 3/24

## 2019-10-21 ENCOUNTER — Other Ambulatory Visit: Payer: Self-pay

## 2019-10-21 ENCOUNTER — Ambulatory Visit (INDEPENDENT_AMBULATORY_CARE_PROVIDER_SITE_OTHER): Payer: Medicare HMO | Admitting: Family Medicine

## 2019-10-21 ENCOUNTER — Encounter: Payer: Self-pay | Admitting: Family Medicine

## 2019-10-21 VITALS — BP 98/64 | HR 67 | Temp 98.2°F | Ht 68.0 in | Wt 197.0 lb

## 2019-10-21 DIAGNOSIS — M79671 Pain in right foot: Secondary | ICD-10-CM

## 2019-10-21 DIAGNOSIS — M79672 Pain in left foot: Secondary | ICD-10-CM

## 2019-10-21 DIAGNOSIS — E790 Hyperuricemia without signs of inflammatory arthritis and tophaceous disease: Secondary | ICD-10-CM

## 2019-10-21 LAB — URIC ACID: Uric Acid, Serum: 9 mg/dL — ABNORMAL HIGH (ref 4.0–7.8)

## 2019-10-21 MED ORDER — INDOMETHACIN 25 MG PO CAPS: 25.0000 mg | ORAL_CAPSULE | Freq: Three times a day (TID) | ORAL | 1 refills | Status: DC | PRN

## 2019-10-21 NOTE — Progress Notes (Signed)
Subjective:    Patient ID: Randy [name], male    DOB: 05-22-1946, 74 y.o.   MRN: [id number]  HPI Chief Complaint  Patient presents with  . Gout    B/l hands - patient states this started last Thursday, but has since resolved - likes to have Indomethacin on hand when he has a flare up    I have never seen patient for problem with gout.  He reports that this is been going on for many years.  He reports that he is affected in both feet.  Has episodes at least monthly.  No known triggers.  Has taken indomethacin 50 mg in the past with good relief.  Current episode completely resolved with no residual pain, redness, swelling.  He had spontaneous resolution of pain after 3 to 4 days, reported that it was very uncomfortable.  Reports pain is typically somewhat generalized and he denies redness or swelling.  He last obtained indomethacin from a friend of his.  Does not know if he has had elevated uric acid in the past.    Review of Systems Per HPI    Objective:   Physical Exam Vitals reviewed.  Constitutional:      General: He is not in acute distress.    Appearance: Normal appearance. He is normal weight. He is not ill-appearing, toxic-appearing or diaphoretic.  HENT:     Head: Normocephalic and atraumatic.  Eyes:     Conjunctiva/sclera: Conjunctivae normal.  Cardiovascular:     Rate and Rhythm: Normal rate and regular rhythm.     Heart sounds: Normal heart sounds.  Pulmonary:     Effort: Pulmonary effort is normal.     Breath sounds: Normal breath sounds.  Musculoskeletal:     Right lower leg: No edema.     Left lower leg: No edema.     Comments: Patient declined having his feet examined as he is currently having no symptoms.  Skin:    General: Skin is warm and dry.  Neurological:     Mental Status: He is alert and oriented to person, place, and time.  Psychiatric:        Mood and Affect: Mood normal.        Behavior: Behavior normal.        Thought Content: Thought content  normal.        Judgment: Judgment normal.       BP 98/64 (BP Location: Left Arm, Patient Position: Sitting, Cuff Size: Normal)   Pulse 67   Temp 98.2 F (36.8 C) (Temporal)   Ht 5\' 8"  (1.727 m)   Wt 197 lb (89.4 kg)   SpO2 99%   BMI 29.95 kg/m  Wt Readings from Last 3 Encounters:  10/21/19 197 lb (89.4 kg)  08/21/19 197 lb (89.4 kg)  06/10/19 204 lb (92.5 kg)       Assessment & Plan:  1. Pain in both feet -Symptoms currently resolved, will check uric acid level and consider allopurinol if elevated to prevent flares -We will give low-dose indomethacin and discussed use with patient including using smallest amount for shortest amount of time due to his decreased kidney function - Uric Acid - indomethacin (INDOCIN) 25 MG capsule; Take 1 capsule (25 mg total) by mouth 3 (three) times daily as needed.  Dispense: 21 capsule; Refill: 1 -He has follow-up on file for next month  This visit occurred during the SARS-CoV-2 public health emergency.  Safety protocols were in place, including screening questions prior  to the visit, additional usage of staff PPE, and extensive cleaning of exam room while observing appropriate contact time as indicated for disinfecting solutions.     Clarene Reamer, FNP-BC  [facility] at [facility], [facility]  10/21/2019 11:43 AM

## 2019-10-21 NOTE — Patient Instructions (Signed)
Good to see you today  I will call you with your uric acid level and see if you need daily medication to prevent flares  I have sent indomethacin to your pharmacy, can take 3 times a day as needed with food for flare. Use smallest amount possible to treat flare.

## 2019-10-24 ENCOUNTER — Other Ambulatory Visit: Payer: Self-pay | Admitting: Student

## 2019-10-26 MED ORDER — ALLOPURINOL 100 MG PO TABS: 50.0000 mg | ORAL_TABLET | Freq: Every day | ORAL | 2 refills | Status: DC

## 2019-10-26 NOTE — Addendum Note (Signed)
Addended by: Clarene Reamer B on: 10/26/2019 01:25 PM   Modules accepted: Orders

## 2019-10-27 ENCOUNTER — Ambulatory Visit (INDEPENDENT_AMBULATORY_CARE_PROVIDER_SITE_OTHER): Payer: Medicare HMO

## 2019-10-27 DIAGNOSIS — Z95810 Presence of automatic (implantable) cardiac defibrillator: Secondary | ICD-10-CM

## 2019-10-27 DIAGNOSIS — I5022 Chronic systolic (congestive) heart failure: Secondary | ICD-10-CM

## 2019-10-30 NOTE — Progress Notes (Signed)
EPIC Encounter for ICM Monitoring  Patient Name: MOEEZ YAMAMOTO is a 74 y.o. male Date: 10/30/2019 Primary Care Physican: Elby Beck, FNP Primary Cardiologist:Taylor Electrophysiologist:Taylor Last Weight: 198lbs baseline   Transmission reviewed.  Corvue thoracic impedance returned to normal.  Prescribed: Furosemide40 mg 1 tablet daily  Labs: 08/05/2019 Creatinine 1.94, BUN 39, Potassium 5.1, Sodium 137, GFR 34.02 06/10/2019 Creatinine 2.65, BUN 42, Potassium 5.1, Sodium 139 A complete set of results can be found in Results Review.  Recommendations:None  Follow-up plan: ICM clinic phone appointment on 11/24/2019.91 day device clinic remote transmission scheduled for 12/31/2019.  Copy of ICM check sent to Dr.Taylor.  3 month ICM trend: 10/28/2019    1 Year ICM trend:       Rosalene Billings, RN 10/30/2019 4:57 PM

## 2019-11-04 ENCOUNTER — Encounter: Payer: Self-pay | Admitting: Family Medicine

## 2019-11-04 ENCOUNTER — Ambulatory Visit: Payer: Medicare HMO | Admitting: Family Medicine

## 2019-11-04 VITALS — BP 124/80 | HR 66 | Temp 97.7°F | Ht 68.0 in | Wt 193.0 lb

## 2019-11-04 DIAGNOSIS — E790 Hyperuricemia without signs of inflammatory arthritis and tophaceous disease: Secondary | ICD-10-CM | POA: Diagnosis not present

## 2019-11-04 DIAGNOSIS — M25522 Pain in left elbow: Secondary | ICD-10-CM | POA: Diagnosis not present

## 2019-11-04 DIAGNOSIS — M25521 Pain in right elbow: Secondary | ICD-10-CM

## 2019-11-04 NOTE — Progress Notes (Signed)
Subjective:    Patient ID: Randy [name], male    DOB: 06/03/1946, 74 y.o.   MRN: [id number]  HPI Chief Complaint  Patient presents with  . Gout    Pt states he had a Gout flare-up in his elbows and feet. It has cleared up.   This is a 74 yo male who presents today with above cc. He was seen 10/21/19 with cc of gout flare, which had resolved. Uric acid was checked and found to mildly elevated. He was started on allopurinol. He was given a prescription for indomethacin to use for flare. He reports that he was not called regarding results and medication. He had several days of pain in feet which is now resolved and left elbow pain that has improved. He had difficulty bending right elbow to feed himself. Has history of left elbow pain and had xray last year which showed mild degenerative changes.    Review of Systems Per HPI    Objective:   Physical Exam Vitals reviewed.  Constitutional:      General: He is not in acute distress.    Appearance: Normal appearance. He is normal weight. He is not ill-appearing, toxic-appearing or diaphoretic.  HENT:     Right Ear: External ear normal.     Left Ear: External ear normal.  Cardiovascular:     Rate and Rhythm: Normal rate.  Pulmonary:     Effort: Pulmonary effort is normal.  Musculoskeletal:     Right elbow: Deformity (prominent bony landmarks. ) present. No effusion. Normal range of motion. Tenderness (generalized, no joint space tenderness. ) present.     Left elbow: Deformity (same as right) present.     Cervical back: Normal range of motion and neck supple.     Right lower leg: No edema.     Left lower leg: No edema.     Comments: Resolving area linear excoriation, ecchymosis above elbow.   Skin:    General: Skin is warm and dry.  Neurological:     General: No focal deficit present.     Mental Status: He is alert.  Psychiatric:        Mood and Affect: Mood normal.        Behavior: Behavior normal.        Thought Content:  Thought content normal.        Judgment: Judgment normal.       BP 124/80 (BP Location: Left Arm, Patient Position: Sitting)   Pulse 66   Temp 97.7 F (36.5 C)   Ht 5\' 8"  (1.727 m)   Wt 183 lb (83 kg)   SpO2 96%   BMI 27.83 kg/m  Wt Readings from Last 3 Encounters:  11/04/19 183 lb (83 kg)  10/21/19 197 lb (89.4 kg)  08/21/19 197 lb (89.4 kg)       Assessment & Plan:  1. Bilateral elbow joint pain - unclear if this is gout, not currently with warmth, redness, very mild tenderness - acetaminophen as needed, activity as tolerated, heat prn  2. Elevated uric acid in blood - reviewed his medications, allopurinol, indomethacin and indications for use.   He has follow up on file for later this month, will check uric acid, symptoms  This visit occurred during the SARS-CoV-2 public health emergency.  Safety protocols were in place, including screening questions prior to the visit, additional usage of staff PPE, and extensive cleaning of exam room while observing appropriate contact time as indicated for disinfecting solutions.  Clarene Reamer, FNP-BC  [facility] at [facility], [facility]  11/04/2019 2:53 PM

## 2019-11-04 NOTE — Progress Notes (Signed)
   Subjective:    Patient ID: Randy [name], male    DOB: 1946-03-06, 74 y.o.   MRN: [id number]  HPI  Chief Complaint  Patient presents with  . Gout    Pt states he had a Gout flare-up in his elbows and feet. It has cleared up.       Review of Systems  Constitutional: Negative.   HENT: Negative.   Eyes: Negative.   Respiratory: Negative.   Cardiovascular: Negative.   Gastrointestinal: Negative.   Genitourinary: Negative.   Musculoskeletal: Positive for joint pain.       Bilateral ankle pain; bilateral elbow pain  Skin: Negative.   Neurological: Negative.   Psychiatric/Behavioral: Negative.     Review of Systems  Constitutional: Negative.   HENT: Negative.   Eyes: Negative.   Respiratory: Negative.   Cardiovascular: Negative.   Gastrointestinal: Negative.   Genitourinary: Negative.   Musculoskeletal: Positive for joint pain.       Bilateral ankle pain; bilateral elbow pain  Skin: Negative.   Neurological: Negative.   Endo/Heme/Allergies: Negative.   Psychiatric/Behavioral: Negative.        Objective:   Physical Exam Constitutional:      Appearance: He is well-developed and well-nourished.  HENT:     Head: Normocephalic and atraumatic.  Eyes:     Extraocular Movements: EOM normal.     Conjunctiva/sclera: Conjunctivae normal.     Pupils: Pupils are equal, round, and reactive to light.  Cardiovascular:     Rate and Rhythm: Normal rate and regular rhythm.     Heart sounds: Normal heart sounds.  Pulmonary:     Effort: Pulmonary effort is normal.     Breath sounds: Normal breath sounds.  Abdominal:     General: Bowel sounds are normal.     Palpations: Abdomen is soft.  Musculoskeletal:     Cervical back: Normal range of motion and neck supple.     Comments: Deformity to bilateral elbow, no hyperemia, no tenderness  Skin:    General: Skin is warm and dry.  Neurological:     Mental Status: He is alert and oriented to person, place, and time.       Physical Exam  Constitutional: He is oriented to person, place, and time. He appears well-developed and well-nourished.  HENT:  Head: Normocephalic and atraumatic.  Eyes: Pupils are equal, round, and reactive to light. Conjunctivae and EOM are normal.  Cardiovascular: Normal rate, regular rhythm and normal heart sounds.  Respiratory: Effort normal and breath sounds normal.  GI: Soft. Bowel sounds are normal.  Musculoskeletal:     Cervical back: Normal range of motion and neck supple.     Comments: Deformity to bilateral elbow, no hyperemia, no tenderness  Neurological: He is alert and oriented to person, place, and time.  Skin: Skin is warm and dry.          Assessment & Plan:  1. Gout Please check with your pharmacy for a prescriptions for Allopurinol and Indomethacin. The administration instructions are in you AVS paper.  2. Increase physical activity as much as possible. Golf is a great option! 3. Follow-up in a month.

## 2019-11-13 ENCOUNTER — Telehealth: Payer: Self-pay

## 2019-11-13 ENCOUNTER — Ambulatory Visit: Payer: Medicare HMO

## 2019-11-13 ENCOUNTER — Other Ambulatory Visit: Payer: Self-pay

## 2019-11-13 ENCOUNTER — Other Ambulatory Visit: Payer: Medicare HMO

## 2019-11-13 NOTE — Telephone Encounter (Signed)
FYI-Called patient to complete his Medicare visit and he stated that he was busy and did not have time. Prefers to do this when he comes in next week for his physical. Appointment cancelled per patient request.

## 2019-11-13 NOTE — Telephone Encounter (Signed)
Noted  

## 2019-11-16 ENCOUNTER — Other Ambulatory Visit: Payer: Self-pay | Admitting: Internal Medicine

## 2019-11-20 ENCOUNTER — Encounter: Payer: Self-pay | Admitting: Family Medicine

## 2019-11-20 ENCOUNTER — Ambulatory Visit (INDEPENDENT_AMBULATORY_CARE_PROVIDER_SITE_OTHER): Payer: Medicare HMO | Admitting: Family Medicine

## 2019-11-20 ENCOUNTER — Other Ambulatory Visit: Payer: Self-pay

## 2019-11-20 VITALS — BP 100/58 | HR 72 | Temp 98.2°F | Ht 66.75 in | Wt 187.0 lb

## 2019-11-20 DIAGNOSIS — E790 Hyperuricemia without signs of inflammatory arthritis and tophaceous disease: Secondary | ICD-10-CM | POA: Diagnosis not present

## 2019-11-20 DIAGNOSIS — E782 Mixed hyperlipidemia: Secondary | ICD-10-CM | POA: Diagnosis not present

## 2019-11-20 DIAGNOSIS — Z0000 Encounter for general adult medical examination without abnormal findings: Secondary | ICD-10-CM

## 2019-11-20 DIAGNOSIS — E1121 Type 2 diabetes mellitus with diabetic nephropathy: Secondary | ICD-10-CM

## 2019-11-20 DIAGNOSIS — D1723 Benign lipomatous neoplasm of skin and subcutaneous tissue of right leg: Secondary | ICD-10-CM | POA: Diagnosis not present

## 2019-11-20 MED ORDER — ALLOPURINOL 100 MG PO TABS: 50.0000 mg | ORAL_TABLET | Freq: Every day | ORAL | 2 refills | Status: DC

## 2019-11-20 NOTE — Progress Notes (Signed)
Subjective:    Patient ID: Randy [name], male    DOB: 1946/02/25, 74 y.o.   MRN: [id number]  HPI Chief Complaint  Patient presents with  . Annual Exam   This is a 74 yo male who presents today for CPE. Has been doing well.   Last CPE- 06/16/18 PSA- 06/16/18 Colonoscopy- 02/06/2010 Tdap- 01/14/2008- overdue, advised to get at his local pharmacy Flu-annual Dental- full dentures Eye- within last 1 year Exercise- house and yard work, occasional walking  DM- has lost some weight, decreased appetite, lives alone, does not check blood sugars. Last hgba1c 8.0.   Gout- history of gout flares. Was started on low dose allopurinol earlier this year but does not believe he is taking. No recent flares.   Hyperlipidemia- medication list states he is on atorvastatin but he doesn't recall that he is taking. Last lipid panel 1/21 revealed poor control and I offered him referral to lipid clinic which he declined.     Review of Systems  Constitutional: Positive for appetite change (decreased).  HENT: Negative.   Eyes: Negative.   Respiratory: Positive for shortness of breath (with activity, unchanged). Negative for cough and chest tightness.   Cardiovascular: Negative.   Gastrointestinal: Negative.   Endocrine: Negative.   Genitourinary: Negative.   Musculoskeletal: Negative.   Skin: Negative.   Allergic/Immunologic: Negative.   Neurological: Negative.   Hematological: Negative.   Psychiatric/Behavioral: Negative.        Objective:   Physical Exam Vitals reviewed.  Constitutional:      General: He is not in acute distress.    Appearance: Normal appearance. He is normal weight. He is not ill-appearing, toxic-appearing or diaphoretic.  HENT:     Head: Normocephalic and atraumatic.     Right Ear: External ear normal.     Left Ear: External ear normal.  Eyes:     Conjunctiva/sclera: Conjunctivae normal.  Cardiovascular:     Pulses: Normal pulses.     Heart sounds: Normal heart  sounds.  Pulmonary:     Effort: Pulmonary effort is normal.     Breath sounds: Normal breath sounds.  Musculoskeletal:     Right lower leg: No edema.     Left lower leg: No edema.     Comments: Top of right foot with large lipoma. No redness, tenderness, drainage.   Skin:    General: Skin is warm and dry.  Neurological:     Mental Status: He is alert and oriented to person, place, and time.  Psychiatric:        Mood and Affect: Mood normal.        Behavior: Behavior normal.        Thought Content: Thought content normal.        Judgment: Judgment normal.       BP (!) 100/58 (BP Location: Left Arm, Patient Position: Sitting, Cuff Size: Normal)   Pulse 72   Temp 98.2 F (36.8 C) (Temporal)   Ht 5' 6.75" (1.695 m)   Wt 187 lb (84.8 kg)   SpO2 98%   BMI 29.51 kg/m  Wt Readings from Last 3 Encounters:  11/20/19 187 lb (84.8 kg)  11/04/19 193 lb (87.5 kg)  10/21/19 197 lb (89.4 kg)   BP Readings from Last 3 Encounters:  11/20/19 (!) 100/58  11/04/19 124/80  10/21/19 98/64   Depression screen PHQ 2/9 11/20/2019 06/16/2018 04/22/2017 03/09/2016 06/22/2014  Decreased Interest 0 0 0 0 0  Down, Depressed, Hopeless 0 0 0  0 0  PHQ - 2 Score 0 0 0 0 0  Altered sleeping 0 - 0 - -  Tired, decreased energy 0 - 1 - -  Change in appetite 0 - 1 - -  Feeling bad or failure about yourself  0 - 0 - -  Trouble concentrating 0 - 0 - -  Moving slowly or fidgety/restless 0 - 0 - -  Suicidal thoughts 0 - 0 - -  PHQ-9 Score 0 - 2 - -  Difficult doing work/chores Not difficult at all - Not difficult at all - -  Some recent data might be hidden       Assessment & Plan:  1. Annual physical exam - Discussed and encouraged healthy lifestyle choices- adequate sleep, regular exercise, stress management and healthy food choices.    2. Type 2 diabetes with nephropathy (HCC) - Hemoglobin A1c  3. Elevated uric acid in blood - will resend allopurinol prescription, provided written and verbal  instruction for taking - allopurinol (ZYLOPRIM) 100 MG tablet; Take 0.5 tablets (50 mg total) by mouth daily.  Dispense: 30 tablet; Refill: 2  4. Mixed hyperlipidemia - he will check and see if he is taking his atorvastatin at home, will also have CMA check with him when results called to him  5. Lipoma of right lower extremity - chronic, not bothering him, discussed options if it gets larger or bothersome  - follow up in 6 months. He is to continue follow up with cardiology and nephrology  This visit occurred during the SARS-CoV-2 public health emergency.  Safety protocols were in place, including screening questions prior to the visit, additional usage of staff PPE, and extensive cleaning of exam room while observing appropriate contact time as indicated for disinfecting solutions.      Clarene Reamer, FNP-BC  [facility] at [facility], [place] [facility]  11/21/2019 9:39 AM

## 2019-11-20 NOTE — Patient Instructions (Signed)
Good to see you today  I have resent your gout prevention medication to your pharmacy- Allopurinol take 1/2 tablet daily  Look and see if you have atorvastatin at home- are you taking? Call me if you don't have it and if you need a refill- this is a cholesterol medication  Follow up in 6 months, please bring all of your medications with you to double check what I have in the computer  You can get your tetanus shot at your pharmacy

## 2019-11-21 LAB — HEMOGLOBIN A1C
Hgb A1c MFr Bld: 7.8 % of total Hgb — ABNORMAL HIGH (ref <5.7)
Mean Plasma Glucose: 177 (calc)
eAG (mmol/L): 9.8 (calc)

## 2019-11-24 ENCOUNTER — Telehealth: Payer: Self-pay | Admitting: Family Medicine

## 2019-11-24 ENCOUNTER — Ambulatory Visit (INDEPENDENT_AMBULATORY_CARE_PROVIDER_SITE_OTHER): Payer: Medicare HMO

## 2019-11-24 ENCOUNTER — Other Ambulatory Visit: Payer: Self-pay | Admitting: Family Medicine

## 2019-11-24 DIAGNOSIS — I5022 Chronic systolic (congestive) heart failure: Secondary | ICD-10-CM

## 2019-11-24 DIAGNOSIS — Z95810 Presence of automatic (implantable) cardiac defibrillator: Secondary | ICD-10-CM

## 2019-11-24 DIAGNOSIS — E1121 Type 2 diabetes mellitus with diabetic nephropathy: Secondary | ICD-10-CM

## 2019-11-24 DIAGNOSIS — E782 Mixed hyperlipidemia: Secondary | ICD-10-CM

## 2019-11-24 NOTE — Progress Notes (Signed)
  Chronic Care Management   Outreach Note  11/24/2019 Name: Randy [name] MRN: [id number] DOB: July 29, 1946  Referred by: Elby Beck, FNP Reason for referral : No chief complaint on file.   An unsuccessful telephone outreach was attempted today. The patient was referred to the pharmacist for assistance with care management and care coordination.   This note is not being shared with the patient for the following reason: To respect privacy (The patient or proxy has requested that the information not be shared).   Follow Up Plan:   Raynicia Dukes [employer] Scheduler

## 2019-11-24 NOTE — Progress Notes (Signed)
  Chronic Care Management   Note  11/24/2019 Name: [name] MRN: [id number] DOB: 28-Nov-1945  Randy [name] is a 74 y.o. year old male who is a primary care patient of Elby Beck, FNP. I reached out to Randy [name] by phone today in response to a referral sent by [name]'s PCP, Elby Beck, FNP.   [name] was given information about Chronic Care Management services today including:  1. CCM service includes personalized support from designated clinical staff supervised by his physician, including individualized plan of care and coordination with other care providers 2. 24/7 contact phone numbers for assistance for urgent and routine care needs. 3. Service will only be billed when office clinical staff spend 20 minutes or more in a month to coordinate care. 4. Only one practitioner may furnish and bill the service in a calendar month. 5. The patient may stop CCM services at any time (effective at the end of the month) by phone call to the office staff.   Patient agreed to services and verbal consent obtained.    This note is not being shared with the patient for the following reason: To respect privacy (The patient or proxy has requested that the information not be shared).  Follow up plan:   Raynicia Dukes [employer] Scheduler

## 2019-11-27 ENCOUNTER — Telehealth: Payer: Self-pay

## 2019-11-27 ENCOUNTER — Other Ambulatory Visit: Payer: Self-pay | Admitting: Internal Medicine

## 2019-11-27 NOTE — Telephone Encounter (Signed)
Remote ICM transmission received.  Attempted call to patient regarding ICM remote transmission and left detailed message per DPR.  Advised to return call for any fluid symptoms or questions. Next ICM remote transmission scheduled 01/01/2020.

## 2019-11-27 NOTE — Progress Notes (Signed)
EPIC Encounter for ICM Monitoring  Patient Name: Randy [name] is a 74 y.o. male Date: 11/27/2019 Primary Care Physican: Elby Beck, FNP Primary Cardiologist:Taylor Electrophysiologist:Taylor Last Weight: 198lbs baseline  AT/AF Burden: <1% (taking Aspirin) Episode 11/19/19:  12 sec   Attempted call to patient and unable to reach.  Left detailed message per DPR regarding transmission. Transmission reviewed.   Corvue thoracic impedancenormal.  Prescribed: Furosemide40 mg 1 tablet daily  Labs: 08/05/2019 Creatinine 1.94, BUN 39, Potassium 5.1, Sodium 137, GFR 34.02 06/10/2019 Creatinine 2.65, BUN 42, Potassium 5.1, Sodium 139 A complete set of results can be found in Results Review.  Recommendations:Left voice mail with ICM number and encouraged to call if experiencing any fluid symptoms.  Follow-up plan: ICM clinic phone appointment on 01/01/2020.91 day device clinic remote transmission scheduled for 12/31/2019.  Copy of ICM check sent to Dr.Taylor.  3 month ICM trend: 11/25/2019    1 Year ICM trend:       Rosalene Billings, RN 11/27/2019 2:53 PM

## 2019-12-14 ENCOUNTER — Other Ambulatory Visit: Payer: Self-pay

## 2019-12-14 MED ORDER — FUROSEMIDE 20 MG PO TABS: 20.0000 mg | ORAL_TABLET | Freq: Every day | ORAL | 1 refills | Status: DC

## 2019-12-14 MED ORDER — CARVEDILOL 3.125 MG PO TABS: 3.1250 mg | ORAL_TABLET | Freq: Two times a day (BID) | ORAL | 1 refills | Status: DC

## 2019-12-30 ENCOUNTER — Telehealth: Payer: Self-pay

## 2019-12-30 DIAGNOSIS — J449 Chronic obstructive pulmonary disease, unspecified: Secondary | ICD-10-CM

## 2019-12-30 DIAGNOSIS — I10 Essential (primary) hypertension: Secondary | ICD-10-CM

## 2019-12-30 NOTE — Telephone Encounter (Signed)
Per written referral from PCP, requesting referral in Epic for Randy [name] to chronic care management pharmacy services for the following conditions:   Essential hypertension, benign  [I10]  COPD [J44.9]  Debbora Dus, PharmD Clinical Pharmacist [facility] at [facility] [phone]

## 2019-12-30 NOTE — Telephone Encounter (Signed)
Referral placed.  Nothing further needed.  

## 2019-12-31 ENCOUNTER — Telehealth: Payer: Self-pay

## 2019-12-31 ENCOUNTER — Other Ambulatory Visit: Payer: Self-pay

## 2019-12-31 ENCOUNTER — Ambulatory Visit: Payer: Medicare HMO

## 2019-12-31 ENCOUNTER — Ambulatory Visit (INDEPENDENT_AMBULATORY_CARE_PROVIDER_SITE_OTHER): Payer: Medicare HMO | Admitting: *Deleted

## 2019-12-31 DIAGNOSIS — I5022 Chronic systolic (congestive) heart failure: Secondary | ICD-10-CM | POA: Diagnosis not present

## 2019-12-31 DIAGNOSIS — I255 Ischemic cardiomyopathy: Secondary | ICD-10-CM

## 2019-12-31 DIAGNOSIS — M1A09X1 Idiopathic chronic gout, multiple sites, with tophus (tophi): Secondary | ICD-10-CM

## 2019-12-31 DIAGNOSIS — E1121 Type 2 diabetes mellitus with diabetic nephropathy: Secondary | ICD-10-CM

## 2019-12-31 DIAGNOSIS — E782 Mixed hyperlipidemia: Secondary | ICD-10-CM

## 2019-12-31 DIAGNOSIS — I10 Essential (primary) hypertension: Secondary | ICD-10-CM

## 2019-12-31 MED ORDER — ACCU-CHEK SOFTCLIX LANCETS MISC: 2 refills | Status: AC

## 2019-12-31 MED ORDER — ACCU-CHEK AVIVA PLUS VI STRP: ORAL_STRIP | 3 refills | Status: DC

## 2019-12-31 NOTE — Telephone Encounter (Signed)
Refills sent for lancets and strips.   Nothing further needed.

## 2019-12-31 NOTE — Telephone Encounter (Signed)
Patient needs a refill on lancets and test strips. Please send to [facility], [place].   Thank you!  Debbora Dus, PharmD Clinical Pharmacist [facility] at [facility] [phone]

## 2019-12-31 NOTE — Chronic Care Management (AMB) (Signed)
Chronic Care Management Pharmacy  Name: Randy [name]  MRN: [id number] DOB: 1946/05/24  Chief Complaint/ HPI  Randy [name],  74 y.o. , male presents for their Initial CCM visit with the clinical pharmacist In office.  PCP : Elby Beck, FNP  Their chronic conditions include: hypertension, CHF, COPD, allergic rhinitis, type 2 DM, OA, CKD stage 3, vitamin D deficiency, HLD, idiopathic gout, PAF, CAD  Patient concerns: denies medication concerns  Office Visits:  11/20/19: [name] - AWV, DM - does not check BG, A1c 8%, unsure about adherence to allopurinol and atorvastatin  11/04/19: [name] - Bilateral elbow pain/gout - start allopurinol and indomethacin short term, increase activity   10/21/19: [name] - Pain in both feet, pt suspects gout and taking indomethacin from a friend, check uric acid level  08/21/19: [name] - DM, change glipizide 5 mg BID to 10 mg XL, discussed adding Trulicity but pt declines injectables, declines referral to lipid clinic, hypotension, asymptomatic, refer to cardio  Consult Visit:  10/19/19: Cardiology - CHF, note unavailable   No Known Allergies  Medications: Outpatient Encounter Medications as of 12/31/2019  Medication Sig  . Alcohol Swabs (B-D SINGLE USE SWABS REGULAR) PADS Use as instructed to clean area for glucose monitoring once daily and as needed.  Diagnosis:  E11.21  Non insulin-dependent  . aspirin EC 325 MG tablet Take 1 tablet (325 mg total) by mouth daily.  . Blood Glucose Calibration (ACCU-CHEK AVIVA) SOLN Use to calibrate blood glucose machine as recommended.  Diagnosis:  E11.21  Non-insulin dependent.  . Blood Glucose Monitoring Suppl (ACCU-CHEK AVIVA PLUS) w/Device KIT USE TO CHECK BLOOD SUGAR ONCE DAILY. NEED MD APPOINTMENT  . finasteride (PROSCAR) 5 MG tablet TAKE 1 TABLET EVERY DAY  . indomethacin (INDOCIN) 25 MG capsule Take 1 capsule (25 mg total) by mouth 3 (three) times daily as needed.  . tamsulosin (FLOMAX) 0.4 MG  CAPS capsule TAKE 1 CAPSULE EVERY DAY  . [DISCONTINUED] Accu-Chek Softclix Lancets lancets USE AS DIRECTED  TO TEST BLOOD SUGAR ONE TIME DAILY  AND AS NEEDED  . [DISCONTINUED] allopurinol (ZYLOPRIM) 100 MG tablet Take 0.5 tablets (50 mg total) by mouth daily.  . [DISCONTINUED] amiodarone (PACERONE) 200 MG tablet Take one tablet by mouth daily Monday through Saturday.  Do NOT take on Sunday,. (Patient not taking: Reported on 01/06/2020)  . [DISCONTINUED] atorvastatin (LIPITOR) 80 MG tablet TAKE 1 TABLET (80 MG TOTAL) DAILY AT 6 PM.  . [DISCONTINUED] carvedilol (COREG) 3.125 MG tablet Take 1 tablet (3.125 mg total) by mouth 2 (two) times daily.  . [DISCONTINUED] ENTRESTO 24-26 MG TAKE 1 TABLET BY MOUTH TWICE A DAY  . [DISCONTINUED] furosemide (LASIX) 20 MG tablet Take 1 tablet (20 mg total) by mouth daily.  . [DISCONTINUED] gabapentin (NEURONTIN) 300 MG capsule Take 2 capsules (600 mg total) by mouth 2 (two) times daily.  . [DISCONTINUED] glipiZIDE (GLUCOTROL XL) 10 MG 24 hr tablet Take 1 tablet (10 mg total) by mouth daily with breakfast.  . [DISCONTINUED] glucose blood (ACCU-CHEK AVIVA PLUS) test strip Test Blood Sugar 1 time daily: Dx:E11.27  . [DISCONTINUED] Tiotropium Bromide-Olodaterol (STIOLTO RESPIMAT) 2.5-2.5 MCG/ACT AERS Inhale 2 puffs into the lungs daily. (Patient not taking: Reported on 01/06/2020)   No facility-administered encounter medications on file as of 12/31/2019.   Current Diagnosis/Assessment:  Goals Addressed            This Visit's Progress   . Pharmacy Care Plan       CARE PLAN ENTRY  Current Barriers:  . Chronic Disease Management support, education, and care coordination needs related to Hypertension, Hyperlipidemia, Diabetes, and Gout   Hypertension . Pharmacist Clinical Goal(s): o Over the next 30 days, patient will work with PharmD and providers to maintain BP goal <130/80 mmHg . Current regimen:  o Carvedilol 3.125 mg - 1 tablet twice  daily . Interventions: o Assessed control, clinic readings within goal; no dizziness or falls . Patient self care activities - Over the next 30 days, patient will: o Check home blood pressure monthly, document, and provide at future appointments o Ensure daily salt intake < 2300 mg/day  Hyperlipidemia . Pharmacist Clinical Goal(s): o Over the next 30 days, patient will work with PharmD and providers to achieve LDL goal < 70, triglycerides < 150, HDL > 40 . Current regimen:  o Atorvastatin 80 mg - 1 tablet daily . Interventions: o Triglycerides are high causing inaccurate calculation of LDL cholesterol, recommend improving diabetes control and healthy diet . Patient self care activities - Over the next 30 days, patient will: . Slowly increase exercise with goal of 30 minutes, 5 days per week . Incorporate a healthy diet high in vegetables, fruits and whole grains with low-fat dairy products, chicken, fish, legumes, non-tropical vegetable oils and nuts. Limit intake of sweets, sugar-sweetened beverages and red meats.  Diabetes . Pharmacist Clinical Goal(s): o Over the next 30 days, patient will work with PharmD and providers to achieve A1c goal <7% . Current regimen:  o Glipizide 10 mg ER - 1 tablet daily with breakfast . Interventions: o Recommend routine blood glucose monitoring to assess diabetes control . Patient self care activities - Over the next 30 days, patient will: o Check blood sugar 2-3 days per week, document, and provide at future appointment with pharmacist o Contact provider with any episodes of hypoglycemia o Refill lancets and test strips  Gout . Pharmacist Clinical Goal(s) o Over the next 30 days, patient will work with PharmD and providers to reduce gout flares and assess uric acid  . Current regimen:  o Allopurinol 100 mg - take 1/2 tablet daily  . Interventions: o Recommend repeating uric acid level 2-5 weeks after patient resumes allopurinol. Avoid NSAIDs and  caution with dose titration of allopurinol with CKD. Marland Kitchen Patient self care activities - Over the next 30 days, patient will: o Resume allopurinol, taking one-half tablet daily o Schedule lab appointment for uric acid level o Avoid over the counter anti-inflammatories (NSAIDs) due to kidney impairment (Advil, Aleve, Goody's powder, aspirin containing products)  Medication management . Pharmacist Clinical Goal(s): o Over the next 30 days, patient will work with PharmD and providers to achieve optimal medication adherence . Current pharmacy: [facility] . Interventions o Comprehensive medication review performed. o Utilize [facility] for medication synchronization, packaging and delivery through Efraim Vanallen's coordination . Patient self care activities - Over the next 30 days, patient will: o Take medications as prescribed o Call Sharyn Lull with any questions or concerns  Initial goal documentation      Hypertension   CMP Latest Ref Rng & Units 08/05/2019 06/10/2019 05/11/2019  Glucose 70 - 99 mg/dL 147(H) 120(H) 200(H)  BUN 6 - 23 mg/dL 39(H) 42(H) 38(H)  Creatinine 0.40 - 1.50 mg/dL 1.94(H) 2.65(H) 2.08(H)  Sodium 135 - 145 mEq/L 137 139 139  Potassium 3.5 - 5.1 mEq/L 5.1 5.1 5.2 No hemolysis seen(H)  Chloride 96 - 112 mEq/L 101 101 103  CO2 19 - 32 mEq/L 28 22 28   Calcium  8.4 - 10.5 mg/dL 9.1 9.1 9.4  Total Protein 6.0 - 8.3 g/dL - - 6.6  Total Bilirubin 0.2 - 1.2 mg/dL - - 0.4  Alkaline Phos 39 - 117 U/L - - 41  AST 0 - 37 U/L - - 12  ALT 0 - 53 U/L - - 7   Office blood pressures are: BP Readings from Last 3 Encounters:  01/06/20 (!) 94/58  11/20/19 (!) 100/58  11/04/19 124/80   Patient has failed these meds in the past: none reported Patient checks BP at home infrequently  BP goal < 130/80 mmHg  Patient is currently controlled on the following medications:   Carvedilol 3.125 mg - 1 tablet BID (refills timely)  We discussed: confirms adherence, denies dizziness or  falls   Plan: Continue current medications  Atrial Fibrillation   Reports pacemaker and defibrillator placement  Patient has failed these meds in past: none reported Patient is currently on the following medications:   Amiodarone 200 mg - 1 tablet Monday through Saturday only   Aspirin 325 mg - 1 tablet daily (not taking)  Adherence: out of medication for the past few months  We discussed: consult with cardio regarding pt adherence - spoke to nurse, she would like to schedule patient for appt   Plan: Coordinate with cardio regarding non-adherence.   Hyperlipidemia/CAD   Lipid Panel     Component Value Date/Time   CHOL 181 08/05/2019 0930   TRIG (H) 08/05/2019 0930    415.0 Triglyceride is over 400; calculations on Lipids are invalid.   HDL 27.40 (L) 08/05/2019 0930   LDLCALC 115 (H) 03/09/2016 0820   LDLDIRECT 101.0 08/05/2019 0930    LDL goal < 70 Patient has failed these meds in past: none reported Patient is currently uncontrolled on the following medications:  . Atorvastatin 80 mg - 1 tablet daily  Adherence: Pt reports taking atorvastatin daily, refilled 11/28/19 90 DS, but no report for earlier this year  We discussed: TG elevated causing inaccuracy of LDL calculation, poor diabetes control  Plan: Continue current medications; Improve DM control.   COPD / Tobacco   Last spirometry score: none available per chart, per pulmonary - severe obstruction 04/2018  Eosinophil count:   Lab Results  Component Value Date/Time   EOSPCT 3.1 05/06/2019 12:56 PM  %                               Eos (Absolute):  Lab Results  Component Value Date/Time   EOSABS 0.2 05/06/2019 12:56 PM   Tobacco Status:  Social History   Tobacco Use  Smoking Status Former Smoker  . Packs/day: 2.00  . Years: 17.00  . Pack years: 34.00  . Types: Cigarettes  . Quit date: 07/30/1984  . Years since quitting: 35.4  Smokeless Tobacco Never Used   Patient has failed these meds in past: none  reported Patient is currently controlled on the following medications:   Stiolto (tiotropium-olodaterol) 2.5-2.62mg/act - 2 puffs daily (not taking)  Using maintenance inhaler regularly? no - patient is not taking Stiolto, has been off for some time  Frequency of rescue inhaler use:  never - does not have a rescue inhaler and denies need for one  We discussed: pt denies SOB except with exercise  Plan:Continue lifestyle management; Remove Stiolto from med list.   Diabetes   Recent Relevant Labs: Lab Results  Component Value Date/Time   HGBA1C 7.8 (H) 11/20/2019  04:27 PM   HGBA1C 8.0 (H) 08/05/2019 09:30 AM   HGBA1C 6.4 06/16/2018 09:19 AM   MICROALBUR 2.0 (H) 08/05/2019 09:36 AM   MICROALBUR 0.9 06/16/2018 09:35 AM    Checking BG: none, out of DM supplies Denies hypoglycemia symptoms  A1c goal < 7% Patient has tried these meds in past: metformin - CKD, declined weekly injectable with PCP 07/2019 Patient is currently uncontrolled on the following medications:   Glipizide 10 mg ER - 1 tablet daily with breakfast (refills timely)  Last diabetic eye exam:  Lab Results  Component Value Date/Time   HMDIABEYEEXA No Retinopathy 07/14/2019 12:00 AM    Last diabetic foot exam: 05/2018 PCP per chart  Plan: Continue current medications; Begin checking blood glucose in the morning 2-3 days per week for the next 2 weeks. Request refill for test strips/lancets. Due for annual foot exam.   Heart Failure   Type: Systolic Last ejection fraction: 07/2017 25-30% NYHA Class: II (slight limitation of activity) AHA HF Stage: C (Heart disease and symptoms present)  Patient has failed these meds in past: none Patient is currently controlled on the following medications:   Carvedilol 3.125 mg - 1 tablet BID  Entresto 24-26 mg - 1 tablet BID  Furosemide 20 mg - 1 tablet daily   Adherence: all refilled timely since 07/2019 We discussed: denies swelling, reports SOB with activities  outside of usual routine; walks around yard to barn and back for exercise  Plan: Continue current medications  Gout   Uric Acid: 9.0 10/21/19  Patient has failed these meds in past:  Patient is currently uncontrolled on the following medications:   Allopurinol 100 mg - 1/2 tablet daily   Indomethacin 25 mg - 1 capsule TID (7 day supply given 10/21/19)  History: Allopurinol started after most recent elevated uric acid level, notable - pt has CKD  Adherence: Pt picked up allopurinol 10/26/19 90 DS and is already out of medication, concerned he may have taken 1 whole tablet daily rather than 1/2 tablet; denies taking indomethacin since gout flare resolved several months ago  We discussed: request new refill of allopurinol, recommend adherence packaging to assist with correct dosing  Plan: Continue current medications; Refill allopurinol. Recommend repeating uric acid level 2-5 weeks after patient resumes allopurinol. Avoid NSAIDs and caution with dose titration of allopurinol with CKD.  Diabetic Neuropathy   Patient has failed these meds in past: none reported  Patient is currently controlled on the following medications:   Gabapentin 300 mg - 2 capsules BID  CrCl: 39 ml/min (actual body weight, SCr 1.94) --> max daily dose 1400 mg daily per FDA, 900 mg/day per Lexi-Comp Adherence: no refill history, however, pt confirms he has bottle at home We discussed: denies nerve pain with current therapy  Plan: Continue current medications   BPH (no diagnosis in chart)   Patient has failed these meds in past: none reported Patient is currently controlled on the following medications:   Tamsulosin 0.4 mg - 1 capsule daily  Finasteride 5 mg - 1 tablet daily  Adherence: pt confirms adherence although no refill history for tamsulosin We discussed: prescriptions per previous PCP - Arnette Norris, pt reports doing well, denies urgency, frequency; wakes up occasionally more than once a night to  urinate, usually sleeps well   Plan: Continue current medications   Vitamin D Deficiency   No recent lab values Patient has failed these meds in past: none reported Patient is currently on the following medications:  No supplementation  We discussed: notable - pt has CKD   Plan: Consider repeat vitamin D level   Medication Management   OTCs: denies OTC use  Pharmacy/Benefits: [facility] Adherence: patient is unable to confirm all medications and is behind on some refills (amiodarone, allopurinol), forgets daily aspirin Affordability: no cost concerns  Preferences: 90 DS, easy open caps, vials   We discussed: Benefits of [facility] services to improve adherence and reduce patient burden of organizing medications, improving safety, delivery, and adherence packaging  Plan: [facility] for medication management   Verbal consent obtained for [facility] enhanced pharmacy services (medication synchronization, adherence packaging, delivery coordination). A medication sync plan was created to allow patient to get all medications delivered once every 30 to 90 days per patient preference. Patient understands they have freedom to choose pharmacy and clinical pharmacist will coordinate care between all prescribers and [facility].  CCM follow up: 02/02/20 at 1:00 PM (telephone call)  Debbora Dus, PharmD Clinical Pharmacist [facility] at [facility] [phone]

## 2020-01-01 ENCOUNTER — Ambulatory Visit (INDEPENDENT_AMBULATORY_CARE_PROVIDER_SITE_OTHER): Payer: Medicare HMO

## 2020-01-01 ENCOUNTER — Telehealth: Payer: Self-pay

## 2020-01-01 ENCOUNTER — Other Ambulatory Visit: Payer: Self-pay

## 2020-01-01 DIAGNOSIS — Z95810 Presence of automatic (implantable) cardiac defibrillator: Secondary | ICD-10-CM

## 2020-01-01 DIAGNOSIS — I5022 Chronic systolic (congestive) heart failure: Secondary | ICD-10-CM

## 2020-01-01 DIAGNOSIS — E1121 Type 2 diabetes mellitus with diabetic nephropathy: Secondary | ICD-10-CM

## 2020-01-01 LAB — CUP PACEART REMOTE DEVICE CHECK
Battery Remaining Longevity: 67 mo
Battery Remaining Percentage: 67 %
Battery Voltage: 2.95 V
Brady Statistic AP VP Percent: 1 %
Brady Statistic AP VS Percent: 1.5 %
Brady Statistic AS VP Percent: 1 %
Brady Statistic AS VS Percent: 97 %
Brady Statistic RA Percent Paced: 1 %
Brady Statistic RV Percent Paced: 1 %
Date Time Interrogation Session: 20210604025525
HighPow Impedance: 73 Ohm
HighPow Impedance: 73 Ohm
Implantable Lead Implant Date: 20091022
Implantable Lead Implant Date: 20180322
Implantable Lead Location: 753859
Implantable Lead Location: 753860
Implantable Lead Model: 7121
Implantable Pulse Generator Implant Date: 20180322
Lead Channel Impedance Value: 1300 Ohm
Lead Channel Impedance Value: 300 Ohm
Lead Channel Pacing Threshold Amplitude: 0.5 V
Lead Channel Pacing Threshold Amplitude: 1.25 V
Lead Channel Pacing Threshold Pulse Width: 0.5 ms
Lead Channel Pacing Threshold Pulse Width: 0.8 ms
Lead Channel Sensing Intrinsic Amplitude: 1.3 mV
Lead Channel Sensing Intrinsic Amplitude: 11 mV
Lead Channel Setting Pacing Amplitude: 2 V
Lead Channel Setting Pacing Amplitude: 2.5 V
Lead Channel Setting Pacing Pulse Width: 0.8 ms
Lead Channel Setting Sensing Sensitivity: 0.5 mV
Pulse Gen Serial Number: 7413245

## 2020-01-01 NOTE — Telephone Encounter (Signed)
Left detailed message for pharmacist.  Advised per this nurse, best thing to do to manage current situation is to have Pt be seen by GT or APP asap to assess his VT prior to refilling amio-d/t length of time Pt has been off it.  Will have scheduler set up asap appt.

## 2020-01-01 NOTE — Telephone Encounter (Addendum)
Adherence review:  Per refill history patient is adherent to the following medications:  Carvedilol - 11/17/19 90 DS  Atorvastatin 80 mg - 11/28/19 90 DS   Glipizide 10 mg ER - 11/12/19 90 DS  Entresto - 12/21/19 30 DS  Furosemide  10/31/19 90 DS  Pt confirms adherence and states he has the following medication bottles at home (but no refill history):  Gabapentin (reports 4-5 pills left), Tamsulosin (reports ~30 days left), Finasteride (reports ~30 days left)  Patient reports he is out of amiodarone (last filled 07/07/19) and allopurinol (last filled 10/26/19 90DS).  He should not be out of allopurinol, but [facility] (may have taken a whole tablet daily causing him to run out early). I have contacted cardiology regarding the amiodarone as likely out of medication for several months. Cardio is contacting pt for office visit. Pt denies taking aspirin daily.   Pt agrees to interest in medication coordination through [facility]. Medications will be synchronized and delivered. [facility] and received DM supplies and indomethacin. Please send the following prescriptions to [facility]:   Glipizide, Allopurinol, Finasteride, Gabapentin (last prescribed by previous PCP), tamsulosin (last prescribed by previous PCP), and preferred dose of aspirin if you would like pt to resume aspirin daily.   Thanks!  Debbora Dus, PharmD Clinical Pharmacist [facility] at [facility] [phone]

## 2020-01-04 ENCOUNTER — Other Ambulatory Visit: Payer: Self-pay

## 2020-01-04 DIAGNOSIS — E790 Hyperuricemia without signs of inflammatory arthritis and tophaceous disease: Secondary | ICD-10-CM

## 2020-01-04 MED ORDER — CARVEDILOL 3.125 MG PO TABS: 3.1250 mg | ORAL_TABLET | Freq: Two times a day (BID) | ORAL | 1 refills | Status: DC

## 2020-01-04 MED ORDER — ALLOPURINOL 100 MG PO TABS: 50.0000 mg | ORAL_TABLET | Freq: Every day | ORAL | 2 refills | Status: DC

## 2020-01-04 MED ORDER — FUROSEMIDE 20 MG PO TABS: 20.0000 mg | ORAL_TABLET | Freq: Every day | ORAL | 1 refills | Status: DC

## 2020-01-04 MED ORDER — ATORVASTATIN CALCIUM 80 MG PO TABS: ORAL_TABLET | ORAL | 1 refills | Status: DC

## 2020-01-04 MED ORDER — GLIPIZIDE ER 10 MG PO TB24: 10.0000 mg | ORAL_TABLET | Freq: Every day | ORAL | 1 refills | Status: DC

## 2020-01-04 MED ORDER — GABAPENTIN 300 MG PO CAPS: 600.0000 mg | ORAL_CAPSULE | Freq: Two times a day (BID) | ORAL | 1 refills | Status: DC

## 2020-01-04 MED ORDER — ENTRESTO 24-26 MG PO TABS: 1.0000 | ORAL_TABLET | Freq: Two times a day (BID) | ORAL | 1 refills | Status: DC

## 2020-01-04 NOTE — Telephone Encounter (Signed)
I am not able to approve Gabapentin for refill - Jackelyn Poling will need to sign off on this.

## 2020-01-04 NOTE — Telephone Encounter (Signed)
Urgent refill request for gabapentin and allopurinol to be delivered by [facility] today. Please send to [facility].  Thanks!  Debbora Dus, PharmD Clinical Pharmacist [facility] at [facility] [phone]

## 2020-01-04 NOTE — Telephone Encounter (Signed)
Medication marked for refill to [facility].  Will send back to [facility] to make her aware.

## 2020-01-05 NOTE — Progress Notes (Signed)
Remote ICD transmission.   

## 2020-01-05 NOTE — Progress Notes (Signed)
EPIC Encounter for ICM Monitoring  Patient Name: Randy [name] is a 74 y.o. male Date: 01/05/2020 Primary Care Physican: Elby Beck, FNP Primary Cardiologist:Taylor Electrophysiologist:Taylor 11/20/2019 Office Weight: 187lbs baseline  AT/AF Burden: <1% (taking Aspirin)  Transmission reviewed.   Corvue thoracic impedancenormal.  Prescribed: Furosemide20 mg 1 tablet daily  Labs: 08/05/2019 Creatinine 1.94, BUN 39, Potassium 5.1, Sodium 137, GFR 34.02 06/10/2019 Creatinine 2.65, BUN 42, Potassium 5.1, Sodium 139 A complete set of results can be found in Results Review.  Recommendations:None  Follow-up plan: ICM clinic phone appointment on7/06/2020.91 day device clinic remote transmission scheduled for 03/31/2020.  OV with Dr Lovena Le 01/06/2020.  Copy of ICM check sent to Dr.Taylor.  3 month ICM trend: 01/01/2020    1 Year ICM trend:       Rosalene Billings, RN 01/05/2020 8:38 AM

## 2020-01-06 ENCOUNTER — Ambulatory Visit (INDEPENDENT_AMBULATORY_CARE_PROVIDER_SITE_OTHER): Payer: Medicare HMO | Admitting: Internal Medicine

## 2020-01-06 ENCOUNTER — Encounter: Payer: Self-pay | Admitting: Internal Medicine

## 2020-01-06 ENCOUNTER — Other Ambulatory Visit: Payer: Self-pay

## 2020-01-06 VITALS — BP 94/58 | HR 68 | Ht 66.75 in | Wt 184.0 lb

## 2020-01-06 DIAGNOSIS — I5022 Chronic systolic (congestive) heart failure: Secondary | ICD-10-CM | POA: Diagnosis not present

## 2020-01-06 DIAGNOSIS — I10 Essential (primary) hypertension: Secondary | ICD-10-CM | POA: Diagnosis not present

## 2020-01-06 DIAGNOSIS — Z95810 Presence of automatic (implantable) cardiac defibrillator: Secondary | ICD-10-CM

## 2020-01-06 LAB — CUP PACEART INCLINIC DEVICE CHECK
Battery Remaining Longevity: 70 mo
Brady Statistic RA Percent Paced: 0.26 %
Brady Statistic RV Percent Paced: 0.05 %
Date Time Interrogation Session: 20210609161400
HighPow Impedance: 76.5 Ohm
Implantable Lead Implant Date: 20091022
Implantable Lead Implant Date: 20180322
Implantable Lead Location: 753859
Implantable Lead Location: 753860
Implantable Lead Model: 7121
Implantable Pulse Generator Implant Date: 20180322
Lead Channel Impedance Value: 1275 Ohm
Lead Channel Impedance Value: 287.5 Ohm
Lead Channel Pacing Threshold Amplitude: 0.5 V
Lead Channel Pacing Threshold Amplitude: 1.25 V
Lead Channel Pacing Threshold Pulse Width: 0.5 ms
Lead Channel Pacing Threshold Pulse Width: 0.8 ms
Lead Channel Sensing Intrinsic Amplitude: 1.5 mV
Lead Channel Sensing Intrinsic Amplitude: 10.9 mV
Lead Channel Setting Pacing Amplitude: 2 V
Lead Channel Setting Pacing Amplitude: 2.5 V
Lead Channel Setting Pacing Pulse Width: 0.8 ms
Lead Channel Setting Sensing Sensitivity: 0.5 mV
Pulse Gen Serial Number: 7413245

## 2020-01-06 NOTE — Telephone Encounter (Signed)
Spoke with Sharyn Lull and advised Pt will NOT be restarting amiodarone at this time.

## 2020-01-06 NOTE — Patient Instructions (Signed)
Medication Instructions:  Your physician recommends that you continue on your current medications as directed. Please refer to the Current Medication list given to you today.  Labwork: None ordered.  Testing/Procedures: None ordered.  Follow-Up: Your physician wants you to follow-up in: one year with Dr. Lovena Le.   You will receive a reminder letter in the mail two months in advance. If you don't receive a letter, please call our office to schedule the follow-up appointment.  Remote monitoring is used to monitor your ICD from home. This monitoring reduces the number of office visits required to check your device to one time per year. It allows [place] to keep an eye on the functioning of your device to ensure it is working properly. You are scheduled for a device check from home on 02/08/2020. You may send your transmission at any time that day. If you have a wireless device, the transmission will be sent automatically. After your physician reviews your transmission, you will receive a postcard with your next transmission date.  Any Other Special Instructions Will Be Listed Below (If Applicable).  If you need a refill on your cardiac medications before your next appointment, please call your pharmacy.

## 2020-01-06 NOTE — Progress Notes (Signed)
HPI Randy [name] returns today for followup. He is a pleasant 74 yo man with an ICM, chronic class 2 CHF, VT, and PAF. He has been off of his amiodarone for at least 3 months and possibly longer. He denies chest pain, sob or palpitations. He admits to being fairly sedentary.  No Known Allergies   Current Outpatient Medications  Medication Sig Dispense Refill  . Accu-Chek Softclix Lancets lancets USE AS DIRECTED  TO TEST BLOOD SUGAR ONE TIME DAILY  AND AS NEEDED 200 each 2  . Alcohol Swabs (B-D SINGLE USE SWABS REGULAR) PADS Use as instructed to clean area for glucose monitoring once daily and as needed.  Diagnosis:  E11.21  Non insulin-dependent 100 each 3  . allopurinol (ZYLOPRIM) 100 MG tablet Take 0.5 tablets (50 mg total) by mouth daily. 30 tablet 2  . aspirin EC 325 MG tablet Take 1 tablet (325 mg total) by mouth daily.    Marland Kitchen atorvastatin (LIPITOR) 80 MG tablet TAKE 1 TABLET (80 MG TOTAL) DAILY AT 6 PM. 90 tablet 1  . Blood Glucose Calibration (ACCU-CHEK AVIVA) SOLN Use to calibrate blood glucose machine as recommended.  Diagnosis:  E11.21  Non-insulin dependent. 1 each 2  . Blood Glucose Monitoring Suppl (ACCU-CHEK AVIVA PLUS) w/Device KIT USE TO CHECK BLOOD SUGAR ONCE DAILY. NEED MD APPOINTMENT 1 kit 0  . carvedilol (COREG) 3.125 MG tablet Take 1 tablet (3.125 mg total) by mouth 2 (two) times daily. 180 tablet 1  . finasteride (PROSCAR) 5 MG tablet TAKE 1 TABLET EVERY DAY 90 tablet 1  . furosemide (LASIX) 20 MG tablet Take 1 tablet (20 mg total) by mouth daily. 90 tablet 1  . gabapentin (NEURONTIN) 300 MG capsule Take 2 capsules (600 mg total) by mouth 2 (two) times daily. 180 capsule 1  . glipiZIDE (GLUCOTROL XL) 10 MG 24 hr tablet Take 1 tablet (10 mg total) by mouth daily with breakfast. 90 tablet 1  . glucose blood (ACCU-CHEK AVIVA PLUS) test strip Test Blood Sugar 1 time daily: Dx:E11.27 100 each 3  . indomethacin (INDOCIN) 25 MG capsule Take 1 capsule (25 mg total) by mouth 3  (three) times daily as needed. 21 capsule 1  . sacubitril-valsartan (ENTRESTO) 24-26 MG Take 1 tablet by mouth 2 (two) times daily. 180 tablet 1  . tamsulosin (FLOMAX) 0.4 MG CAPS capsule TAKE 1 CAPSULE EVERY DAY 90 capsule 2   No current facility-administered medications for this visit.     Past Medical History:  Diagnosis Date  . AICD (automatic cardioverter/defibrillator) present 10/18/2016   "replaced 2009-St. Jude device, Dr Lovena Le"  . Anemia   . Arthritis    "back" (08/27/2017)  . CAD (coronary artery disease)   . CHF (congestive heart failure) ([place])   . CKD (chronic kidney disease) stage 3, GFR 30-59 ml/min    [name]  . Diverticulosis   . Dyslipidemia   . GERD (gastroesophageal reflux disease)   . Hypertension   . Neuromuscular disorder (HCC)    neuropathy- in hands  . Osteoarthritis    hips, hands  . Pacemaker   . Shortness of breath   . Sleep apnea    started a sleep study but didn't completed, states PCP told him he has sleep  apnea, doesn't use CPAP  . Systolic dysfunction   . Type 2 diabetes with nephropathy ([place])   . Vitamin D deficiency     ROS:   All systems reviewed and negative except as noted in  the HPI.   Past Surgical History:  Procedure Laterality Date  . ANTERIOR CERVICAL DECOMP/DISCECTOMY FUSION    . BACK SURGERY    . CARPAL TUNNEL RELEASE Left   . CORONARY ARTERY BYPASS GRAFT N/A 09/02/2017   Procedure: CORONARY ARTERY BYPASS GRAFTING (CABG) x three , using left internal mammary artery and right leg greater saphenous vein harvested endoscopically;  Surgeon: [name], MD;  Location: [facility];  Service: Open Heart Surgery;  Laterality: N/A;  . CYST EXCISION     "back"  . ICD IMPLANT N/A 10/18/2016   Procedure: ICD Implant- Upgrade to Dual Chamber;  Surgeon: Evans Lance, MD;  Location: [place] [facility];  Service: Cardiovascular;  Laterality: N/A;  . INSERT / REPLACE / REMOVE PACEMAKER    . JOINT REPLACEMENT    . LEFT HEART CATH AND  CORONARY ANGIOGRAPHY N/A 08/30/2017   Procedure: LEFT HEART CATH AND CORONARY ANGIOGRAPHY;  Surgeon: Martinique, Peter M, MD;  Location: [facility];  Service: Cardiovascular;  Laterality: N/A;  . LUMBAR LAMINECTOMY Left 07/17/2013   Procedure: MICRODISCECTOMY LUMBAR LAMINECTOMY;  Surgeon: Marybelle Killings, MD;  Location: [facility];  Service: Orthopedics;  Laterality: Left;  Left L4 Laminotomy, Lateral Recess Decompression, Cyst Excision  . SHOULDER OPEN ROTATOR CUFF REPAIR Left   . TEE WITHOUT CARDIOVERSION N/A 09/02/2017   Procedure: TRANSESOPHAGEAL ECHOCARDIOGRAM (TEE);  Surgeon: [name], MD;  Location: [facility];  Service: Open Heart Surgery;  Laterality: N/A;  . TOTAL HIP ARTHROPLASTY Left 09/17/2012   Procedure: Left TOTAL HIP ARTHROPLASTY ANTERIOR APPROACH;  Surgeon: Marybelle Killings, MD;  Location: [facility];  Service: Orthopedics;  Laterality: Left;     Family History  Problem Relation Age of Onset  . Heart attack Father 54  . Diabetes Brother      Social History   Socioeconomic History  . Marital status: Divorced    Spouse name: Not on file  . Number of children: 2  . Years of education: Not on file  . Highest education level: Not on file  Occupational History  . 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 Smoking status: Former Smoker    Packs/day: 2.00    Years: 17.00    Pack years: 34.00    Types: Cigarettes    Quit date: 07/30/1984    Years since quitting: 35.4  . Smokeless tobacco: Never Used  Substance and Sexual Activity  . Alcohol use: Yes    Comment: 08/27/2017 "probably have had 6 beers in the last 6 months; drink when I play golf"  . Drug use: No  . Sexual activity: Never  Other Topics Concern  . Not on file  Social History Narrative   Does not have a living will.   Desires CPR, does want life support if not futile.      Social Determinants of Health   Financial Resource Strain:   . Difficulty of Paying Living  Expenses:   Food Insecurity:   . Worried About Charity fundraiser in the Last Year:   . Arboriculturist in the Last Year:   Transportation Needs:   . Film/video editor (Medical):   Marland Kitchen Lack of Transportation (Non-Medical):   Physical Activity:   . Days of Exercise per Week:   . Minutes of Exercise per Session:   Stress:   . Feeling of Stress :   Social Connections:   .  Frequency of Communication with Friends and Family:   . Frequency of Social Gatherings with Friends and Family:   . Attends Religious Services:   . Active Member of Clubs or Organizations:   . Attends Archivist Meetings:   Marland Kitchen Marital Status:   Intimate Partner Violence:   . Fear of Current or Ex-Partner:   . Emotionally Abused:   Marland Kitchen Physically Abused:   . Sexually Abused:      BP (!) 94/58   Pulse 68   Ht 5' 6.75" (1.695 m)   Wt 184 lb (83.5 kg)   SpO2 98%   BMI 29.03 kg/m   Physical Exam:  Well appearing NAD HEENT: Unremarkable Neck:  No JVD, no thyromegally Lymphatics:  No adenopathy Back:  No CVA tenderness Lungs:  Clear with no wheezes HEART:  Regular rate rhythm, no murmurs, no rubs, no clicks Abd:  soft, positive bowel sounds, no organomegally, no rebound, no guarding Ext:  2 plus pulses, no edema, no cyanosis, no clubbing Skin:  No rashes no nodules Neuro:  CN II through XII intact, motor grossly intact  EKG - nsr  DEVICE  Normal device function.  See PaceArt for details.   Assess/Plan: 1. VT - he has not had any additional VT. He will follow up in several months. 2. ICD - his St. Jude DDD ICD is working normally.  3. PAF - he is maintaining NSR. I considered restarting her amio but we will hold off for now. 4. Chronic systolic heart failure - he has class 2 symptoms. I encouraged him to increase his level of activity. He will avoid salty foods.  Mikle Bosworth.D.

## 2020-01-11 NOTE — Patient Instructions (Signed)
Dear Randy [name],  It was a pleasure meeting you during our initial appointment on December 31, 2019. Below is a summary of the goals we discussed and components of chronic care management. Please contact me anytime with questions or concerns.   Visit Information  Goals Addressed            This Visit's Progress   . Pharmacy Care Plan       CARE PLAN ENTRY  Current Barriers:  . Chronic Disease Management support, education, and care coordination needs related to Hypertension, Hyperlipidemia, Diabetes, and Gout   Hypertension . Pharmacist Clinical Goal(s): o Over the next 30 days, patient will work with PharmD and providers to maintain BP goal <130/80 mmHg . Current regimen:  o Carvedilol 3.125 mg - 1 tablet twice daily . Interventions: o Assessed control, clinic readings within goal; no dizziness or falls . Patient self care activities - Over the next 30 days, patient will: o Check home blood pressure monthly, document, and provide at future appointments o Ensure daily salt intake < 2300 mg/day  Hyperlipidemia . Pharmacist Clinical Goal(s): o Over the next 30 days, patient will work with PharmD and providers to achieve LDL goal < 70, triglycerides < 150, HDL > 40 . Current regimen:  o Atorvastatin 80 mg - 1 tablet daily . Interventions: o Triglycerides are high causing inaccurate calculation of LDL cholesterol, recommend improving diabetes control and healthy diet . Patient self care activities - Over the next 30 days, patient will: . Slowly increase exercise with goal of 30 minutes, 5 days per week . Incorporate a healthy diet high in vegetables, fruits and whole grains with low-fat dairy products, chicken, fish, legumes, non-tropical vegetable oils and nuts. Limit intake of sweets, sugar-sweetened beverages and red meats.  Diabetes . Pharmacist Clinical Goal(s): o Over the next 30 days, patient will work with PharmD and providers to achieve A1c goal <7% . Current regimen:   o Glipizide 10 mg ER - 1 tablet daily with breakfast . Interventions: o Recommend routine blood glucose monitoring to assess diabetes control . Patient self care activities - Over the next 30 days, patient will: o Check blood sugar 2-3 days per week, document, and provide at future appointment with pharmacist o Contact provider with any episodes of hypoglycemia o Refill lancets and test strips  Gout . Pharmacist Clinical Goal(s) o Over the next 30 days, patient will work with PharmD and providers to reduce gout flares and assess uric acid  . Current regimen:  o Allopurinol 100 mg - take 1/2 tablet daily  . Interventions: o Recommend repeating uric acid level 2-5 weeks after patient resumes allopurinol. Avoid NSAIDs and caution with dose titration of allopurinol with CKD. Marland Kitchen Patient self care activities - Over the next 30 days, patient will: o Resume allopurinol, taking one-half tablet daily o Schedule lab appointment for uric acid level o Avoid over the counter anti-inflammatories (NSAIDs) due to kidney impairment (Advil, Aleve, Goody's powder, aspirin containing products)  Medication management . Pharmacist Clinical Goal(s): o Over the next 30 days, patient will work with PharmD and providers to achieve optimal medication adherence . Current pharmacy[facility] . Interventions o Comprehensive medication review performed. o Utilize [facility] for medication synchronization, packaging and delivery through Nykeem Citro's coordination . Patient self care activities - Over the next 30 days, patient will: o Take medications as prescribed o Call Sharyn Lull with any questions or concerns  Initial goal documentation      Randy [name]  was given information about Chronic Care Management services today including:  1. CCM service includes personalized support from designated clinical staff supervised by his physician, including individualized plan of care and coordination with other care  providers 2. 24/7 contact phone numbers for assistance for urgent and routine care needs. 3. Standard insurance, coinsurance, copays and deductibles apply for chronic care management only during months in which we provide at least 20 minutes of these services. Most insurances cover these services at 100%, however patients may be responsible for any copay, coinsurance and/or deductible if applicable. This service may help you avoid the need for more expensive face-to-face services. 4. Only one practitioner may furnish and bill the service in a calendar month. 5. The patient may stop CCM services at any time (effective at the end of the month) by phone call to the office staff.  Patient agreed to services and verbal consent obtained.   Verbal consent obtained for [facility] enhanced pharmacy services (medication synchronization, adherence packaging, delivery coordination). A medication sync plan was created to allow patient to get all medications delivered once every 30 to 90 days per patient preference. Patient understands they have freedom to choose pharmacy and clinical pharmacist will coordinate care between all prescribers and [facility].  The patient verbalized understanding of instructions provided today and agreed to receive a mailed copy of patient instruction and/or educational materials. Telephone follow up appointment with pharmacy team member scheduled for: 02/02/20 at 1:00 PM (telephone call) for blood glucose log   Debbora Dus, PharmD Clinical Pharmacist [facility] at [facility] [phone]     Why follow it? Research shows. Those who follow the Mediterranean diet have a reduced risk of heart disease  The diet is associated with a reduced incidence of Parkinson's and Alzheimer's diseases People following the diet may have longer life expectancies and lower rates of chronic diseases  The Dietary Guidelines for Americans recommends the Mediterranean diet  as an eating plan to promote health and prevent disease  What Is the Mediterranean Diet?  Healthy eating plan based on typical foods and recipes of Mediterranean-style cooking The diet is primarily a plant based diet; these foods should make up a majority of meals   Starches - Plant based foods should make up a majority of meals - They are an important sources of vitamins, minerals, energy, antioxidants, and fiber - Choose whole grains, foods high in fiber and minimally processed items  - Typical grain sources include wheat, oats, barley, corn, brown rice, bulgar, farro, millet, polenta, couscous  - Various types of beans include chickpeas, lentils, fava beans, black beans, white beans   Fruits  Veggies - Large quantities of antioxidant rich fruits & veggies; 6 or more servings  - Vegetables can be eaten raw or lightly drizzled with oil and cooked  - Vegetables common to the traditional Mediterranean Diet include: artichokes, arugula, beets, broccoli, brussel sprouts, cabbage, carrots, celery, collard greens, cucumbers, eggplant, kale, leeks, lemons, lettuce, mushrooms, okra, onions, peas, peppers, potatoes, pumpkin, radishes, rutabaga, shallots, spinach, sweet potatoes, turnips, zucchini - Fruits common to the Mediterranean Diet include: apples, apricots, avocados, cherries, clementines, dates, figs, grapefruits, grapes, melons, nectarines, oranges, peaches, pears, pomegranates, strawberries, tangerines  Fats - Replace butter and margarine with healthy oils, such as olive oil, canola oil, and tahini  - Limit nuts to no more than a handful a day  - Nuts include walnuts, almonds, pecans, pistachios, pine nuts  - Limit or avoid candied, honey roasted or heavily  salted nuts - Olives are central to the Mediterranean diet - can be eaten whole or used in a variety of dishes   Meats Protein - Limiting red meat: no more than a few times a month - When eating red meat: choose lean cuts and keep the  portion to the size of deck of cards - Eggs: approx. 0 to 4 times a week  - Fish and lean poultry: at least 2 a week  - Healthy protein sources include, chicken, [place], lean beef, lamb - Increase intake of seafood such as tuna, salmon, trout, mackerel, shrimp, scallops - Avoid or limit high fat processed meats such as sausage and bacon  Dairy - Include moderate amounts of low fat dairy products  - Focus on healthy dairy such as fat free yogurt, skim milk, low or reduced fat cheese - Limit dairy products higher in fat such as whole or 2% milk, cheese, ice cream  Alcohol - Moderate amounts of red wine is ok  - No more than 5 oz daily for women (all ages) and men older than age 19  - No more than 10 oz of wine daily for men younger than 59  Other - Limit sweets and other desserts  - Use herbs and spices instead of salt to flavor foods  - Herbs and spices common to the traditional Mediterranean Diet include: basil, bay leaves, chives, cloves, cumin, fennel, garlic, lavender, marjoram, mint, oregano, parsley, pepper, rosemary, sage, savory, sumac, tarragon, thyme   It's not just a diet, it's a lifestyle:  The Mediterranean diet includes lifestyle factors typical of those in the region  Foods, drinks and meals are best eaten with others and savored Daily physical activity is important for overall good health This could be strenuous exercise like running and aerobics This could also be more leisurely activities such as walking, housework, yard-work, or taking the stairs Moderation is the key; a balanced and healthy diet accommodates most foods and drinks Consider portion sizes and frequency of consumption of certain foods   Meal Ideas & Options:  Breakfast:  Whole wheat toast or whole wheat English muffins with peanut butter & hard boiled egg Steel cut oats topped with apples & cinnamon and skim milk  Fresh fruit: banana, strawberries, melon, berries, peaches  Smoothies: strawberries,  bananas, greek yogurt, peanut butter Low fat greek yogurt with blueberries and granola  Egg white omelet with spinach and mushrooms Breakfast couscous: whole wheat couscous, apricots, skim milk, cranberries  Sandwiches:  Hummus and grilled vegetables (peppers, zucchini, squash) on whole wheat bread   Grilled chicken on whole wheat pita with lettuce, tomatoes, cucumbers or tzatziki  Jordan salad on whole wheat bread: tuna salad made with greek yogurt, olives, red peppers, capers, green onions Garlic rosemary lamb pita: lamb sauted with garlic, rosemary, salt & pepper; add lettuce, cucumber, greek yogurt to pita - flavor with lemon juice and black pepper  Seafood:  Mediterranean grilled salmon, seasoned with garlic, basil, parsley, lemon juice and black pepper Shrimp, lemon, and spinach whole-grain pasta salad made with low fat greek yogurt  Seared scallops with lemon orzo  Seared tuna steaks seasoned salt, pepper, coriander topped with tomato mixture of olives, tomatoes, olive oil, minced garlic, parsley, green onions and cappers  Meats:  Herbed greek chicken salad with kalamata olives, cucumber, feta  Red bell peppers stuffed with spinach, bulgur, lean ground beef (or lentils) & topped with feta   Kebabs: skewers of chicken, tomatoes, onions, zucchini, squash  [place] burgers:  made with red onions, mint, dill, lemon juice, feta cheese topped with roasted red peppers Vegetarian Cucumber salad: cucumbers, artichoke hearts, celery, red onion, feta cheese, tossed in olive oil & lemon juice  Hummus and whole grain pita points with a greek salad (lettuce, tomato, feta, olives, cucumbers, red onion) Lentil soup with celery, carrots made with vegetable broth, garlic, salt and pepper  Tabouli salad: parsley, bulgur, mint, scallions, cucumbers, tomato, radishes, lemon juice, olive oil, salt and pepper.

## 2020-01-11 NOTE — Progress Notes (Signed)
I have collaborated with the care management provider regarding care management and care coordination activities outlined in this encounter and have reviewed this encounter including documentation in the note and care plan. I am certifying that I agree with the content of this note and encounter as supervising provider.  

## 2020-01-13 ENCOUNTER — Telehealth: Payer: Self-pay | Admitting: Family Medicine

## 2020-01-13 NOTE — Telephone Encounter (Signed)
[facility] called today in regards to request they faxed over  They stated that the patient is requesting diabetic testing supplies and they sent a request to our office on 6/8.  They wanted to confirm this was received by the office.    Call back # [phone]

## 2020-01-14 NOTE — Telephone Encounter (Signed)
Pt's glucose supplies sent to [facility] through [facility] 12/31/2019  [employer] is aware and they are cancelling out the request.  Nothing further needed.

## 2020-01-25 ENCOUNTER — Telehealth: Payer: Self-pay | Admitting: Gastroenterology

## 2020-01-25 ENCOUNTER — Telehealth: Payer: Self-pay

## 2020-01-25 MED ORDER — FINASTERIDE 5 MG PO TABS: 5.0000 mg | ORAL_TABLET | Freq: Every day | ORAL | 0 refills | Status: DC

## 2020-01-25 NOTE — Telephone Encounter (Signed)
Pharmacy called for refill on Finadteride

## 2020-01-25 NOTE — Telephone Encounter (Signed)
[facility] at [facility] is aware to contact patient primary care provider Clarene Reamer, NP

## 2020-01-25 NOTE — Telephone Encounter (Signed)
Pt due for follow up with Debbie in October 2021 Finasteride 5mg  refilled to [facility] through October.

## 2020-01-25 NOTE — Telephone Encounter (Signed)
Patient requesting a refill on finasteride 5 mg to [facility].  Thanks,  Debbora Dus, PharmD Clinical Pharmacist [facility] at [facility] [phone]

## 2020-02-02 ENCOUNTER — Ambulatory Visit: Payer: Medicare (Managed Care)

## 2020-02-02 ENCOUNTER — Other Ambulatory Visit: Payer: Self-pay

## 2020-02-02 DIAGNOSIS — I10 Essential (primary) hypertension: Secondary | ICD-10-CM

## 2020-02-02 DIAGNOSIS — E1121 Type 2 diabetes mellitus with diabetic nephropathy: Secondary | ICD-10-CM

## 2020-02-16 ENCOUNTER — Ambulatory Visit (INDEPENDENT_AMBULATORY_CARE_PROVIDER_SITE_OTHER): Payer: Medicare (Managed Care)

## 2020-02-16 DIAGNOSIS — Z95810 Presence of automatic (implantable) cardiac defibrillator: Secondary | ICD-10-CM

## 2020-02-16 DIAGNOSIS — I5022 Chronic systolic (congestive) heart failure: Secondary | ICD-10-CM

## 2020-02-19 ENCOUNTER — Telehealth: Payer: Self-pay

## 2020-02-19 DIAGNOSIS — E1121 Type 2 diabetes mellitus with diabetic nephropathy: Secondary | ICD-10-CM

## 2020-02-19 NOTE — Telephone Encounter (Signed)
Patient received refill on test trips and lancets, but reports he does not have a meter. Can you send a new prescription to [facility] for the Accu-Chek Guide?  Debbora Dus, PharmD Clinical Pharmacist [facility] at [facility] [phone]

## 2020-02-19 NOTE — Progress Notes (Signed)
EPIC Encounter for ICM Monitoring  Patient Name: Randy [name] is a 74 y.o. male Date: 02/19/2020 Primary Care Physican: Elby Beck, FNP Primary Cardiologist:Taylor Electrophysiologist:Taylor 02/19/2020 Office Weight: 190lbs   AT/AF Burden: <1% (taking Aspirin)  Spoke with patient and reports feeling well at this time.  Denies fluid symptoms.    Corvue thoracic impedancenormal.  Prescribed: Furosemide20 mg 1 tablet daily  Labs: 08/05/2019 Creatinine 1.94, BUN 39, Potassium 5.1, Sodium 137, GFR 34.02 06/10/2019 Creatinine 2.65, BUN 42, Potassium 5.1, Sodium 139 A complete set of results can be found in Results Review.  Recommendations:No changes and encouraged to call if experiencing any fluid symptoms.  Follow-up plan: ICM clinic phone appointment on8/23/2021.91 day device clinic remote transmission scheduled for 03/31/2020.   EP/Cardiology Office Visits: Recall 01/02/2021 with Dr. Lovena Le.    Copy of ICM check sent to Dr. Lovena Le.   3 month ICM trend: 02/16/2020    1 Year ICM trend:       Rosalene Billings, RN 02/19/2020 2:55 PM

## 2020-02-22 ENCOUNTER — Telehealth: Payer: Self-pay

## 2020-02-22 MED ORDER — ACCU-CHEK GUIDE W/DEVICE KIT: PACK | 0 refills | Status: DC

## 2020-02-22 NOTE — Chronic Care Management (AMB) (Signed)
Medication Coordination Call:  Reviewed chart for medication changes ahead of medication coordination call.  No OVs, Consults, or hospital visits since CCM call on 02/02/20  BP Readings from Last 3 Encounters:  01/06/20 (!) 94/58  11/20/19 (!) 100/58  11/04/19 124/80    Lab Results  Component Value Date   HGBA1C 7.8 (H) 11/20/2019    BP assessment: In clinic BP running low. Patient does not check BP at home but denies falls or dizziness.  DM assessment: A1c above goal. We are currently working on getting a new glucometer so patient can check BG at home.  Patient obtains medications through [facility]  30 Days   Patients medications were synchronized and will all run out on 02/26/20.   Patient is due for next adherence delivery on: 02/24/20 Called patient and reviewed medications and coordinated delivery.  This delivery to include: . Carvedilol 3.125 mg  - 1 tablet BID . Atorvastatin 80 mg - 1 tablet daily . Glipizide 10 mg ER - 1 tablet with breakfast . Entresto 24-26 mg  - 1 tablet BID . Furosemide 20 mg  - 1 tablet daily . Allopurinol 100 mg  -  tablet daily . Gabapentin 300 mg  - 2 caps BID . Tamsulosin 0.4 mg - 1 tablet daily . Finasteride 5 mg  - 1 tablet daily  Patient needs refills for: none, all refills up to date  Confirmed delivery date of 02/24/20, advised patient that pharmacy will contact them the morning of delivery.  Debbora Dus, PharmD Clinical Pharmacist [facility] at [facility] [phone]

## 2020-02-22 NOTE — Telephone Encounter (Signed)
Rx for Accu-Chek guide glucose meter sent to [facility] as requested.

## 2020-02-29 ENCOUNTER — Other Ambulatory Visit: Payer: Self-pay

## 2020-02-29 ENCOUNTER — Ambulatory Visit: Payer: Medicare (Managed Care)

## 2020-02-29 DIAGNOSIS — I10 Essential (primary) hypertension: Secondary | ICD-10-CM

## 2020-02-29 DIAGNOSIS — E1121 Type 2 diabetes mellitus with diabetic nephropathy: Secondary | ICD-10-CM

## 2020-02-29 NOTE — Chronic Care Management (AMB) (Signed)
Chronic Care Management Pharmacy  Name: Randy [name]  MRN: [id number] DOB: 26-Mar-1946  Chief Complaint/ HPI  Randy [name],  74 y.o., male presents for their Follow-Up CCM visit with the clinical pharmacist via telephone.  PCP : Elby Beck, FNP    Chronic conditions: hypertension, CHF, COPD, allergic rhinitis, type 2 DM, OA, CKD stage 3, vitamin D deficiency, HLD, idiopathic gout, PAF, CAD  Office Visits: Last CCM visit - 02/02/20, no office visits since then   11/20/19: [place], DM - does not check BG, A1c 8%, unsure about adherence to allopurinol and atorvastatin  11/04/19: [name] - Bilateral elbow pain/gout - start allopurinol and indomethacin short term, increase activity   10/21/19: [name] - Pain in both feet, pt suspects gout and taking indomethacin from a friend, check uric acid level  08/21/19: [name] - DM, change glipizide 5 mg BID to 10 mg XL, discussed adding Trulicity but pt declines injectables, declines referral to lipid clinic, hypotension, asymptomatic, refer to cardio  Consult Visit:  01/06/20: Cardiology - off amiodarone for 3 months, no episodes of VT, ICD working normally, PAF in normal sinus rhythm, hold amiodarone for now, chronic heart failure class 2 symptoms, encouraged increasing activity, avoid salty foods  10/19/19: Cardiology - CHF, note unavailable   Medications: Outpatient Encounter Medications as of 02/02/2020  Medication Sig   Accu-Chek Softclix Lancets lancets USE AS DIRECTED  TO TEST BLOOD SUGAR ONE TIME DAILY  AND AS NEEDED   Alcohol Swabs (B-D SINGLE USE SWABS REGULAR) PADS Use as instructed to clean area for glucose monitoring once daily and as needed.  Diagnosis:  E11.21  Non insulin-dependent   allopurinol (ZYLOPRIM) 100 MG tablet Take 0.5 tablets (50 mg total) by mouth daily.   aspirin EC 325 MG tablet Take 1 tablet (325 mg total) by mouth daily.   atorvastatin (LIPITOR) 80 MG tablet TAKE 1 TABLET (80 MG TOTAL) DAILY AT 6  PM.   Blood Glucose Calibration (ACCU-CHEK AVIVA) SOLN Use to calibrate blood glucose machine as recommended.  Diagnosis:  E11.21  Non-insulin dependent.   Blood Glucose Monitoring Suppl (ACCU-CHEK AVIVA PLUS) w/Device KIT USE TO CHECK BLOOD SUGAR ONCE DAILY. NEED MD APPOINTMENT   carvedilol (COREG) 3.125 MG tablet Take 1 tablet (3.125 mg total) by mouth 2 (two) times daily.   finasteride (PROSCAR) 5 MG tablet Take 1 tablet (5 mg total) by mouth daily.   furosemide (LASIX) 20 MG tablet Take 1 tablet (20 mg total) by mouth daily.   gabapentin (NEURONTIN) 300 MG capsule Take 2 capsules (600 mg total) by mouth 2 (two) times daily.   glipiZIDE (GLUCOTROL XL) 10 MG 24 hr tablet Take 1 tablet (10 mg total) by mouth daily with breakfast.   glucose blood (ACCU-CHEK AVIVA PLUS) test strip Test Blood Sugar 1 time daily: Dx:E11.27   indomethacin (INDOCIN) 25 MG capsule Take 1 capsule (25 mg total) by mouth 3 (three) times daily as needed.   sacubitril-valsartan (ENTRESTO) 24-26 MG Take 1 tablet by mouth 2 (two) times daily.   tamsulosin (FLOMAX) 0.4 MG CAPS capsule TAKE 1 CAPSULE EVERY DAY   No facility-administered encounter medications on file as of 02/02/2020.   Current Diagnosis/Assessment:  Goals     Increase physical activity     Starting 04/22/2017, I will continue to play golf 4-5 hours weekly.      Pharmacy Care Plan     CARE PLAN ENTRY  Current Barriers:   Chronic Disease Management support, education, and  care coordination needs related to Hypertension, Hyperlipidemia, Diabetes, and Gout   Hypertension  Pharmacist Clinical Goal(s): o Over the next 3 months, patient will work with PharmD and providers to maintain BP goal <130/80 mmHg  Current regimen:  o Carvedilol 3.125 mg - 1 tablet twice daily  Interventions: o Assessed control, clinic readings within goal; no dizziness or falls  Patient self care activities - Over the next 3 months, patient will: o Check home  blood pressure monthly, document, and provide at future appointments o Ensure daily salt intake < 2300 mg/day  Hyperlipidemia  Pharmacist Clinical Goal(s): o Over the next 3 months, patient will work with PharmD and providers to achieve LDL goal < 70, triglycerides < 150, HDL > 40  Current regimen:  o Atorvastatin 80 mg - 1 tablet daily  Interventions: o Triglycerides are high causing inaccurate calculation of LDL cholesterol, recommend improving diabetes control and healthy diet  Patient self care activities - Over the next 3 months, patient will:  Slowly increase exercise with goal of 30 minutes, 5 days per week  Incorporate a healthy diet high in vegetables, fruits and whole grains with low-fat dairy products, chicken, fish, legumes, non-tropical vegetable oils and nuts. Limit intake of sweets, sugar-sweetened beverages and red meats.  Diabetes  Pharmacist Clinical Goal(s): o Over the next 3 months, patient will work with PharmD and providers to achieve A1c goal <7%  Current regimen:  o Glipizide 10 mg ER - 1 tablet daily with breakfast  Interventions: o Recommend routine blood glucose monitoring to assess diabetes control o Send new glucose meter   Patient self care activities - Over the next 3 months, patient will: o Check blood sugar daily before breakfast, document, and provide at future appointment with pharmacist o A team member will call at the end of August to check on your glucose readings o Contact provider with any episodes of hypoglycemia  Gout  Pharmacist Clinical Goal(s) o Over the next 3 months, patient will work with PharmD and providers to reduce gout flares and assess uric acid   Current regimen:  o Allopurinol 100 mg - take 1/2 tablet daily   Interventions: o Recommend repeating uric acid level with next labs o Avoid NSAIDs and caution with dose titration of allopurinol with CKD  Patient self care activities - Over the next 3 months, patient  will: o Continue allopurinol, taking one-half tablet daily o Call if any gout flares  o Avoid over the counter anti-inflammatories (NSAIDs) due to kidney impairment (Advil, Aleve, Goody's powder, aspirin containing products)  Please see past updates related to this goal by clicking on the "Past Updates" button in the selected goal          Diabetes   Recent Relevant Labs: Lab Results  Component Value Date/Time   HGBA1C 7.8 (H) 11/20/2019 04:27 PM   HGBA1C 8.0 (H) 08/05/2019 09:30 AM   HGBA1C 6.4 06/16/2018 09:19 AM   MICROALBUR 2.0 (H) 08/05/2019 09:36 AM   MICROALBUR 0.9 06/16/2018 09:35 AM   Checking BG: none, patient has still not received BG monitor, will deliver tomorrow  Denies hypoglycemia symptoms  A1c goal < 7% Patient has tried these meds in past: metformin - CKD, declined weekly injectable with PCP 07/2019 Patient is currently uncontrolled on the following medications:   Glipizide 10 mg ER - 1 tablet daily with breakfast (refills timely)  Eye exam - 06/2019 Due for annual foot exam  Plan: Continue current medications  Deliver new glucose meter tomorrow.  Begin checking BG once daily before breakfast.   Gout   Uric Acid: 9.0 10/21/19  Patient has failed these meds in past: none Patient is currently uncontrolled on the following medications:   Allopurinol 100 mg - 1/2 tablet daily   History: Allopurinol started after most recent elevated uric acid level (09/2019) Adherence: patient is using adherence packaging, denies missed doses Denies recent gout flares, last flare 10/21/19, treated with indomethacin 7 day supply  Plan: Continue current medications; Recommend updating uric acid level.  Avoid NSAIDs and caution with dose titration of allopurinol with CKD.  Follow up: CMA assessment call for blood glucose log in 3-4 weeks  Debbora Dus, PharmD Clinical Pharmacist [facility] at [facility] [phone]

## 2020-02-29 NOTE — Patient Instructions (Signed)
Dear Randy [name],  Below is a summary of the goals we discussed during our follow up appointment on February 29, 2020. Please contact me anytime with questions or concerns.   Visit Information  Goals    . Increase physical activity     Starting 04/22/2017, I will continue to play golf 4-5 hours weekly.     Marland Kitchen Pharmacy Care Plan     CARE PLAN ENTRY  Current Barriers:  . Chronic Disease Management support, education, and care coordination needs related to Hypertension, Hyperlipidemia, Diabetes, and Gout   Hypertension . Pharmacist Clinical Goal(s): o Over the next 3 months, patient will work with PharmD and providers to maintain BP goal <130/80 mmHg . Current regimen:  o Carvedilol 3.125 mg - 1 tablet twice daily . Interventions: o Assessed control, clinic readings within goal; no dizziness or falls . Patient self care activities - Over the next 3 months, patient will: o Check home blood pressure monthly, document, and provide at future appointments o Ensure daily salt intake < 2300 mg/day  Hyperlipidemia . Pharmacist Clinical Goal(s): o Over the next 3 months, patient will work with PharmD and providers to achieve LDL goal < 70, triglycerides < 150, HDL > 40 . Current regimen:  o Atorvastatin 80 mg - 1 tablet daily . Interventions: o Triglycerides are high causing inaccurate calculation of LDL cholesterol, recommend improving diabetes control and healthy diet . Patient self care activities - Over the next 3 months, patient will: . Slowly increase exercise with goal of 30 minutes, 5 days per week . Incorporate a healthy diet high in vegetables, fruits and whole grains with low-fat dairy products, chicken, fish, legumes, non-tropical vegetable oils and nuts. Limit intake of sweets, sugar-sweetened beverages and red meats.  Diabetes . Pharmacist Clinical Goal(s): o Over the next 3 months, patient will work with PharmD and providers to achieve A1c goal <7% . Current regimen:   o Glipizide 10 mg ER - 1 tablet daily with breakfast . Interventions: o Recommend routine blood glucose monitoring to assess diabetes control o Send new glucose meter  . Patient self care activities - Over the next 3 months, patient will: o Check blood sugar daily before breakfast, document, and provide at future appointment with pharmacist o A team member will call at the end of August to check on your glucose readings o Contact provider with any episodes of hypoglycemia  Gout . Pharmacist Clinical Goal(s) o Over the next 3 months, patient will work with PharmD and providers to reduce gout flares and assess uric acid  . Current regimen:  o Allopurinol 100 mg - take 1/2 tablet daily  . Interventions: o Recommend repeating uric acid level with next labs o Avoid NSAIDs and caution with dose titration of allopurinol with CKD . Patient self care activities - Over the next 3 months, patient will: o Continue allopurinol, taking one-half tablet daily o Call if any gout flares  o Avoid over the counter anti-inflammatories (NSAIDs) due to kidney impairment (Advil, Aleve, Goody's powder, aspirin containing products)  Please see past updates related to this goal by clicking on the "Past Updates" button in the selected goal       The patient verbalized understanding of instructions provided today and agreed to receive a mailed copy of patient instruction and/or educational materials.  The pharmacy team will reach out to the patient again over the next 30  days. Please check your glucose every morning before breakfast and provide at next telephone call.  Debbora Dus, PharmD Clinical Pharmacist [facility] at [facility] [phone]   Carbohydrate Counting for Diabetes Mellitus, Adult  Carbohydrate counting is a method of keeping track of how many carbohydrates you eat. Eating carbohydrates naturally increases the amount of sugar (glucose) in the blood. Counting how many  carbohydrates you eat helps keep your blood glucose within normal limits, which helps you manage your diabetes (diabetes mellitus). It is important to know how many carbohydrates you can safely have in each meal. This is different for every person. A diet and nutrition specialist (registered dietitian) can help you make a meal plan and calculate how many carbohydrates you should have at each meal and snack. Carbohydrates are found in the following foods:  Grains, such as breads and cereals.  Dried beans and soy products.  Starchy vegetables, such as potatoes, peas, and corn.  Fruit and fruit juices.  Milk and yogurt.  Sweets and snack foods, such as cake, cookies, candy, chips, and soft drinks. How do I count carbohydrates? There are two ways to count carbohydrates in food. You can use either of the methods or a combination of both. Reading "Nutrition Facts" on packaged food The "Nutrition Facts" list is included on the labels of almost all packaged foods and beverages in the [place] It includes:  The serving size.  Information about nutrients in each serving, including the grams (g) of carbohydrate per serving. To use the "Nutrition Facts":  Decide how many servings you will have.  Multiply the number of servings by the number of carbohydrates per serving.  The resulting number is the total amount of carbohydrates that you will be having. Learning standard serving sizes of other foods When you eat carbohydrate foods that are not packaged or do not include "Nutrition Facts" on the label, you need to measure the servings in order to count the amount of carbohydrates:  Measure the foods that you will eat with a food scale or measuring cup, if needed.  Decide how many standard-size servings you will eat.  Multiply the number of servings by 15. Most carbohydrate-rich foods have about 15 g of carbohydrates per serving. ? For example, if you eat 8 oz (170 g) of strawberries, you will  have eaten 2 servings and 30 g of carbohydrates (2 servings x 15 g = 30 g).  For foods that have more than one food mixed, such as soups and casseroles, you must count the carbohydrates in each food that is included. The following list contains standard serving sizes of common carbohydrate-rich foods. Each of these servings has about 15 g of carbohydrates:   hamburger bun or  English muffin.   oz (15 mL) syrup.   oz (14 g) jelly.  1 slice of bread.  1 six-inch tortilla.  3 oz (85 g) cooked rice or pasta.  4 oz (113 g) cooked dried beans.  4 oz (113 g) starchy vegetable, such as peas, corn, or potatoes.  4 oz (113 g) hot cereal.  4 oz (113 g) mashed potatoes or  of a large baked potato.  4 oz (113 g) canned or frozen fruit.  4 oz (120 mL) fruit juice.  4-6 crackers.  6 chicken nuggets.  6 oz (170 g) unsweetened dry cereal.  6 oz (170 g) plain fat-free yogurt or yogurt sweetened with artificial sweeteners.  8 oz (240 mL) milk.  8 oz (170 g) fresh fruit or one small piece of fruit.  24 oz (680 g) popped popcorn. Example of carbohydrate  counting Sample meal  3 oz (85 g) chicken breast.  6 oz (170 g) brown rice.  4 oz (113 g) corn.  8 oz (240 mL) milk.  8 oz (170 g) strawberries with sugar-free whipped topping. Carbohydrate calculation 1. Identify the foods that contain carbohydrates: ? Rice. ? Corn. ? Milk. ? Strawberries. 2. Calculate how many servings you have of each food: ? 2 servings rice. ? 1 serving corn. ? 1 serving milk. ? 1 serving strawberries. 3. Multiply each number of servings by 15 g: ? 2 servings rice x 15 g = 30 g. ? 1 serving corn x 15 g = 15 g. ? 1 serving milk x 15 g = 15 g. ? 1 serving strawberries x 15 g = 15 g. 4. Add together all of the amounts to find the total grams of carbohydrates eaten: ? 30 g + 15 g + 15 g + 15 g = 75 g of carbohydrates total. Summary  Carbohydrate counting is a method of keeping track of how  many carbohydrates you eat.  Eating carbohydrates naturally increases the amount of sugar (glucose) in the blood.  Counting how many carbohydrates you eat helps keep your blood glucose within normal limits, which helps you manage your diabetes.  A diet and nutrition specialist (registered dietitian) can help you make a meal plan and calculate how many carbohydrates you should have at each meal and snack. This information is not intended to replace advice given to you by your health care provider. Make sure you discuss any questions you have with your health care provider. Document Revised: 02/07/2017 Document Reviewed: 12/28/2015 Elsevier Patient Education  [place].

## 2020-02-29 NOTE — Chronic Care Management (AMB) (Signed)
Chronic Care Management Pharmacy  Name: Randy [name]  MRN: [id number] DOB: 1945/08/21  Chief Complaint/ HPI  Randy [name],  74 y.o. , male presents for their Follow-Up CCM visit with the clinical pharmacist via telephone.  PCP : Elby Beck, FNP    Chronic conditions: hypertension, CHF, COPD, allergic rhinitis, type 2 DM, OA, CKD stage 3, vitamin D deficiency, HLD, idiopathic gout, PAF, CAD  Office Visits: Last CCM visit - 12/31/19  11/20/19: [name] - [place], DM - does not check BG, A1c 8%, unsure about adherence to allopurinol and atorvastatin  11/04/19: [name] - Bilateral elbow pain/gout - start allopurinol and indomethacin short term, increase activity   10/21/19: [name] - Pain in both feet, pt suspects gout and taking indomethacin from a friend, check uric acid level  08/21/19: [name] - DM, change glipizide 5 mg BID to 10 mg XL, discussed adding Trulicity but pt declines injectables, declines referral to lipid clinic, hypotension, asymptomatic, refer to cardio  Consult Visit:  01/06/20: Cardiology - off amiodarone for 3 months, no episodes of VT, ICD working normally, PAF in normal sinus rhythm, hold amiodarone for now, chronic heart failure class 2 symptoms, encouraged increasing activity, avoid salty foods  10/19/19: Cardiology - CHF, note unavailable   Medications: Outpatient Encounter Medications as of 02/02/2020  Medication Sig   Accu-Chek Softclix Lancets lancets USE AS DIRECTED  TO TEST BLOOD SUGAR ONE TIME DAILY  AND AS NEEDED   Alcohol Swabs (B-D SINGLE USE SWABS REGULAR) PADS Use as instructed to clean area for glucose monitoring once daily and as needed.  Diagnosis:  E11.21  Non insulin-dependent   allopurinol (ZYLOPRIM) 100 MG tablet Take 0.5 tablets (50 mg total) by mouth daily.   aspirin EC 325 MG tablet Take 1 tablet (325 mg total) by mouth daily.   atorvastatin (LIPITOR) 80 MG tablet TAKE 1 TABLET (80 MG TOTAL) DAILY AT 6 PM.   Blood Glucose  Calibration (ACCU-CHEK AVIVA) SOLN Use to calibrate blood glucose machine as recommended.  Diagnosis:  E11.21  Non-insulin dependent.   Blood Glucose Monitoring Suppl (ACCU-CHEK AVIVA PLUS) w/Device KIT USE TO CHECK BLOOD SUGAR ONCE DAILY. NEED MD APPOINTMENT   carvedilol (COREG) 3.125 MG tablet Take 1 tablet (3.125 mg total) by mouth 2 (two) times daily.   finasteride (PROSCAR) 5 MG tablet Take 1 tablet (5 mg total) by mouth daily.   furosemide (LASIX) 20 MG tablet Take 1 tablet (20 mg total) by mouth daily.   gabapentin (NEURONTIN) 300 MG capsule Take 2 capsules (600 mg total) by mouth 2 (two) times daily.   glipiZIDE (GLUCOTROL XL) 10 MG 24 hr tablet Take 1 tablet (10 mg total) by mouth daily with breakfast.   glucose blood (ACCU-CHEK AVIVA PLUS) test strip Test Blood Sugar 1 time daily: Dx:E11.27   indomethacin (INDOCIN) 25 MG capsule Take 1 capsule (25 mg total) by mouth 3 (three) times daily as needed.   sacubitril-valsartan (ENTRESTO) 24-26 MG Take 1 tablet by mouth 2 (two) times daily.   tamsulosin (FLOMAX) 0.4 MG CAPS capsule TAKE 1 CAPSULE EVERY DAY   No facility-administered encounter medications on file as of 02/02/2020.   Current Diagnosis/Assessment:  Goals     Increase physical activity     Starting 04/22/2017, I will continue to play golf 4-5 hours weekly.      Pharmacy Care Plan     CARE PLAN ENTRY  Current Barriers:   Chronic Disease Management support, education, and care coordination needs related to  Hypertension, Hyperlipidemia, Diabetes, and Gout   Hypertension  Pharmacist Clinical Goal(s): o Over the next 30 days, patient will work with PharmD and providers to maintain BP goal <130/80 mmHg  Current regimen:  o Carvedilol 3.125 mg - 1 tablet twice daily  Interventions: o Assessed control, clinic readings within goal; no dizziness or falls  Patient self care activities - Over the next 30 days, patient will: o Check home blood pressure monthly,  document, and provide at future appointments o Ensure daily salt intake < 2300 mg/day  Hyperlipidemia  Pharmacist Clinical Goal(s): o Over the next 30 days, patient will work with PharmD and providers to achieve LDL goal < 70, triglycerides < 150, HDL > 40  Current regimen:  o Atorvastatin 80 mg - 1 tablet daily  Interventions: o Triglycerides are high causing inaccurate calculation of LDL cholesterol, recommend improving diabetes control and healthy diet  Patient self care activities - Over the next 30 days, patient will:  Slowly increase exercise with goal of 30 minutes, 5 days per week  Incorporate a healthy diet high in vegetables, fruits and whole grains with low-fat dairy products, chicken, fish, legumes, non-tropical vegetable oils and nuts. Limit intake of sweets, sugar-sweetened beverages and red meats.  Diabetes  Pharmacist Clinical Goal(s): o Over the next 30 days, patient will work with PharmD and providers to achieve A1c goal <7%  Current regimen:  o Glipizide 10 mg ER - 1 tablet daily with breakfast  Interventions: o Recommend routine blood glucose monitoring to assess diabetes control  Patient self care activities - Over the next 30 days, patient will: o Check blood sugar 2-3 days per week, document, and provide at future appointment with pharmacist o Contact provider with any episodes of hypoglycemia o Refill lancets and test strips  Gout  Pharmacist Clinical Goal(s) o Over the next 30 days, patient will work with PharmD and providers to reduce gout flares and assess uric acid   Current regimen:  o Allopurinol 100 mg - take 1/2 tablet daily   Interventions: o Recommend repeating uric acid level 2-5 weeks after patient resumes allopurinol. Avoid NSAIDs and caution with dose titration of allopurinol with CKD.  Patient self care activities - Over the next 30 days, patient will: o Resume allopurinol, taking one-half tablet daily o Schedule lab appointment  for uric acid level o Avoid over the counter anti-inflammatories (NSAIDs) due to kidney impairment (Advil, Aleve, Goody's powder, aspirin containing products)  Medication management  Pharmacist Clinical Goal(s): o Over the next 30 days, patient will work with PharmD and providers to achieve optimal medication adherence  Current pharmacy: [facility]  Interventions o Comprehensive medication review performed. o Utilize [facility] pharmacy for medication synchronization, packaging and delivery through [facility]'s coordination  Patient self care activities - Over the next 30 days, patient will: o Take medications as prescribed o Call Marcelino Duster with any questions or concerns  Please see past updates related to this goal by clicking on the "Past Updates" button in the selected goal        Diabetes   Recent Relevant Labs: Lab Results  Component Value Date/Time   HGBA1C 7.8 (H) 11/20/2019 04:27 PM   HGBA1C 8.0 (H) 08/05/2019 09:30 AM   HGBA1C 6.4 06/16/2018 09:19 AM   MICROALBUR 2.0 (H) 08/05/2019 09:36 AM   MICROALBUR 0.9 06/16/2018 09:35 AM   Checking BG: none, reports does not have BG monitor at all  Denies hypoglycemia symptoms  A1c goal < 7% Patient has tried these meds  in past: metformin - CKD, declined weekly injectable with PCP 07/2019 Patient is currently uncontrolled on the following medications:   Glipizide 10 mg ER - 1 tablet daily with breakfast (refills timely)  Plan: Continue current medications  Send rx for glucometer, test strips, lancets. Begin checking BG for next 3 weeks.  Atrial Fibrillation   Reports pacemaker and defibrillator placement  Patient has tried these meds in past: amiodarone, aspirin Patient is currently on the following medications:   No pharmacotherapy  At last visit 12/2019, pt had been out of amiodarone for several months. I contacted cardiology and scheduled pt an appt. Cardio decided to hold amiodarone for now. He has since had  another follow up with cardio and doing well. Pt reports he is doing well off amiodarone at this time.  Plan: Continue follow up with cardiology.   CCM follow up: 3-4 weeks BG log   Debbora Dus, PharmD Clinical Pharmacist [facility] at [facility] [phone]

## 2020-02-29 NOTE — Patient Instructions (Signed)
Dear Randy [name],  Below is a summary of the goals we discussed during our follow up appointment on February 02, 2020. Please contact me anytime with questions or concerns.   Visit Information  Goals Addressed            This Visit's Progress   . Pharmacy Care Plan       CARE PLAN ENTRY  Current Barriers:  . Chronic Disease Management support, education, and care coordination needs related to Hypertension, Hyperlipidemia, Diabetes, and Gout   Hypertension . Pharmacist Clinical Goal(s): o Over the next 30 days, patient will work with PharmD and providers to maintain BP goal <130/80 mmHg . Current regimen:  o Carvedilol 3.125 mg - 1 tablet twice daily . Interventions: o Assessed control, clinic readings within goal; no dizziness or falls . Patient self care activities - Over the next 30 days, patient will: o Check home blood pressure monthly, document, and provide at future appointments o Ensure daily salt intake < 2300 mg/day  Hyperlipidemia . Pharmacist Clinical Goal(s): o Over the next 30 days, patient will work with PharmD and providers to achieve LDL goal < 70, triglycerides < 150, HDL > 40 . Current regimen:  o Atorvastatin 80 mg - 1 tablet daily . Interventions: o Triglycerides are high causing inaccurate calculation of LDL cholesterol, recommend improving diabetes control and healthy diet . Patient self care activities - Over the next 30 days, patient will: . Slowly increase exercise with goal of 30 minutes, 5 days per week . Incorporate a healthy diet high in vegetables, fruits and whole grains with low-fat dairy products, chicken, fish, legumes, non-tropical vegetable oils and nuts. Limit intake of sweets, sugar-sweetened beverages and red meats.  Diabetes . Pharmacist Clinical Goal(s): o Over the next 30 days, patient will work with PharmD and providers to achieve A1c goal <7% . Current regimen:  o Glipizide 10 mg ER - 1 tablet daily with  breakfast . Interventions: o Recommend routine blood glucose monitoring to assess diabetes control . Patient self care activities - Over the next 30 days, patient will: o Check blood sugar 2-3 days per week, document, and provide at future appointment with pharmacist o Contact provider with any episodes of hypoglycemia o Refill lancets and test strips  Gout . Pharmacist Clinical Goal(s) o Over the next 30 days, patient will work with PharmD and providers to reduce gout flares and assess uric acid  . Current regimen:  o Allopurinol 100 mg - take 1/2 tablet daily  . Interventions: o Recommend repeating uric acid level 2-5 weeks after patient resumes allopurinol. Avoid NSAIDs and caution with dose titration of allopurinol with CKD. Marland Kitchen Patient self care activities - Over the next 30 days, patient will: o Resume allopurinol, taking one-half tablet daily o Schedule lab appointment for uric acid level o Avoid over the counter anti-inflammatories (NSAIDs) due to kidney impairment (Advil, Aleve, Goody's powder, aspirin containing products)  Medication management . Pharmacist Clinical Goal(s): o Over the next 30 days, patient will work with PharmD and providers to achieve optimal medication adherence . Current pharmacy: [facility] . Interventions o Comprehensive medication review performed. o Utilize [facility] for medication synchronization, packaging and delivery through Ketara Cavness's coordination . Patient self care activities - Over the next 30 days, patient will: o Take medications as prescribed o Call Sharyn Lull with any questions or concerns  Please see past updates related to this goal by clicking on the "Past Updates" button in the selected goal  Patient verbalizes understanding of instructions provided today.  Telephone follow up appointment with pharmacy team member scheduled for: February 29, 2020 at 11:15 AM (telephone)  Debbora Dus, PharmD Clinical  Pharmacist [facility] [phone]   Carbohydrate Counting for Diabetes Mellitus, Adult  Carbohydrate counting is a method of keeping track of how many carbohydrates you eat. Eating carbohydrates naturally increases the amount of sugar (glucose) in the blood. Counting how many carbohydrates you eat helps keep your blood glucose within normal limits, which helps you manage your diabetes (diabetes mellitus). It is important to know how many carbohydrates you can safely have in each meal. This is different for every person. A diet and nutrition specialist (registered dietitian) can help you make a meal plan and calculate how many carbohydrates you should have at each meal and snack. Carbohydrates are found in the following foods:  Grains, such as breads and cereals.  Dried beans and soy products.  Starchy vegetables, such as potatoes, peas, and corn.  Fruit and fruit juices.  Milk and yogurt.  Sweets and snack foods, such as cake, cookies, candy, chips, and soft drinks. How do I count carbohydrates? There are two ways to count carbohydrates in food. You can use either of the methods or a combination of both. Reading "Nutrition Facts" on packaged food The "Nutrition Facts" list is included on the labels of almost all packaged foods and beverages in the [place] It includes:  The serving size.  Information about nutrients in each serving, including the grams (g) of carbohydrate per serving. To use the "Nutrition Facts":  Decide how many servings you will have.  Multiply the number of servings by the number of carbohydrates per serving.  The resulting number is the total amount of carbohydrates that you will be having. Learning standard serving sizes of other foods When you eat carbohydrate foods that are not packaged or do not include "Nutrition Facts" on the label, you need to measure the servings in order to count the amount of carbohydrates:  Measure the  foods that you will eat with a food scale or measuring cup, if needed.  Decide how many standard-size servings you will eat.  Multiply the number of servings by 15. Most carbohydrate-rich foods have about 15 g of carbohydrates per serving. ? For example, if you eat 8 oz (170 g) of strawberries, you will have eaten 2 servings and 30 g of carbohydrates (2 servings x 15 g = 30 g).  For foods that have more than one food mixed, such as soups and casseroles, you must count the carbohydrates in each food that is included. The following list contains standard serving sizes of common carbohydrate-rich foods. Each of these servings has about 15 g of carbohydrates:   hamburger bun or  English muffin.   oz (15 mL) syrup.   oz (14 g) jelly.  1 slice of bread.  1 six-inch tortilla.  3 oz (85 g) cooked rice or pasta.  4 oz (113 g) cooked dried beans.  4 oz (113 g) starchy vegetable, such as peas, corn, or potatoes.  4 oz (113 g) hot cereal.  4 oz (113 g) mashed potatoes or  of a large baked potato.  4 oz (113 g) canned or frozen fruit.  4 oz (120 mL) fruit juice.  4-6 crackers.  6 chicken nuggets.  6 oz (170 g) unsweetened dry cereal.  6 oz (170 g) plain fat-free yogurt or yogurt sweetened with artificial sweeteners.  8 oz (  240 mL) milk.  8 oz (170 g) fresh fruit or one small piece of fruit.  24 oz (680 g) popped popcorn. Example of carbohydrate counting Sample meal  3 oz (85 g) chicken breast.  6 oz (170 g) brown rice.  4 oz (113 g) corn.  8 oz (240 mL) milk.  8 oz (170 g) strawberries with sugar-free whipped topping. Carbohydrate calculation 1. Identify the foods that contain carbohydrates: ? Rice. ? Corn. ? Milk. ? Strawberries. 2. Calculate how many servings you have of each food: ? 2 servings rice. ? 1 serving corn. ? 1 serving milk. ? 1 serving strawberries. 3. Multiply each number of servings by 15 g: ? 2 servings rice x 15 g = 30 g. ? 1 serving  corn x 15 g = 15 g. ? 1 serving milk x 15 g = 15 g. ? 1 serving strawberries x 15 g = 15 g. 4. Add together all of the amounts to find the total grams of carbohydrates eaten: ? 30 g + 15 g + 15 g + 15 g = 75 g of carbohydrates total. Summary  Carbohydrate counting is a method of keeping track of how many carbohydrates you eat.  Eating carbohydrates naturally increases the amount of sugar (glucose) in the blood.  Counting how many carbohydrates you eat helps keep your blood glucose within normal limits, which helps you manage your diabetes.  A diet and nutrition specialist (registered dietitian) can help you make a meal plan and calculate how many carbohydrates you should have at each meal and snack. This information is not intended to replace advice given to you by your health care provider. Make sure you discuss any questions you have with your health care provider. Document Revised: 02/07/2017 Document Reviewed: 12/28/2015 Elsevier Patient Education  [place].

## 2020-03-16 ENCOUNTER — Telehealth: Payer: Self-pay

## 2020-03-16 NOTE — Progress Notes (Signed)
Unsuccessful outreach to patient. Left message for him to return call.  Martinique Uselman, [place] Pharmacist Assistant  [phone]

## 2020-03-21 ENCOUNTER — Ambulatory Visit (INDEPENDENT_AMBULATORY_CARE_PROVIDER_SITE_OTHER): Payer: Medicare (Managed Care)

## 2020-03-21 DIAGNOSIS — I5022 Chronic systolic (congestive) heart failure: Secondary | ICD-10-CM

## 2020-03-21 DIAGNOSIS — Z95810 Presence of automatic (implantable) cardiac defibrillator: Secondary | ICD-10-CM

## 2020-03-23 ENCOUNTER — Telehealth: Payer: Self-pay

## 2020-03-23 NOTE — Progress Notes (Signed)
EPIC Encounter for ICM Monitoring  Patient Name: KHRISTOPHER KAPAUN is a 74 y.o. male Date: 03/23/2020 Primary Care Physican: Elby Beck, FNP Primary Cardiologist:Taylor Electrophysiologist:Taylor 02/19/2020 OfficeWeight: 190lbs   AT/AF Burden: <1% (taking Aspirin)  Attempted call to patient and unable to reach.  Left detailed message per DPR regarding transmission. Transmission reviewed.     Corvue thoracic impedancesuggesting possible dryness from 8/14 and close to baseline 8/24 but was suggesting possible fluid accumulation from 7/29 - 8/7.   Prescribed: Furosemide20 mg 1 tablet daily  Labs: 08/05/2019 Creatinine 1.94, BUN 39, Potassium 5.1, Sodium 137, GFR 34.02 06/10/2019 Creatinine 2.65, BUN 42, Potassium 5.1, Sodium 139 A complete set of results can be found in Results Review.  Recommendations:Left voice mail with ICM number and encouraged to call if experiencing any fluid symptoms.  Follow-up plan: ICM clinic phone appointment on9/08/2019 to recheck fluid levels.91 day device clinic remote transmission scheduled for9/08/2019.  EP/Cardiology Office Visits: Recall 01/02/2021 with Dr. Lovena Le.    Copy of ICM check sent to Dr. Lovena Le.   3 month ICM trend: 03/22/2020    1 Year ICM trend:       Rosalene Billings, RN 03/23/2020 12:28 PM

## 2020-03-23 NOTE — Telephone Encounter (Signed)
Remote ICM transmission received.  Attempted call to patient regarding ICM remote transmission and left detailed message per DPR to return call.  Advised to return call for any fluid symptoms or questions.      

## 2020-03-24 ENCOUNTER — Telehealth: Payer: Self-pay

## 2020-03-24 NOTE — Progress Notes (Signed)
Unsuccessful outreach to patient. LVM for patient to return call.  Martinique Uselman, [employer] Pharmacist Assistant  [phone]

## 2020-04-01 NOTE — Progress Notes (Signed)
No ICM remote transmission received for 03/31/2020 and next ICM transmission scheduled for 05/02/2020.

## 2020-04-20 ENCOUNTER — Telehealth: Payer: Self-pay

## 2020-04-20 NOTE — Chronic Care Management (AMB) (Signed)
Chronic Care Management Pharmacy Assistant   Name: Randy [name]  MRN: [id number] DOB: 1946/07/29  Reason for Encounter: Medication Review / Monthly Medication Dispensing Call.  PCP : Elby Beck, FNP  Allergies:  No Known Allergies  Medications: Outpatient Encounter Medications as of 04/20/2020  Medication Sig  . Accu-Chek Softclix Lancets lancets USE AS DIRECTED  TO TEST BLOOD SUGAR ONE TIME DAILY  AND AS NEEDED  . Alcohol Swabs (B-D SINGLE USE SWABS REGULAR) PADS Use as instructed to clean area for glucose monitoring once daily and as needed.  Diagnosis:  E11.21  Non insulin-dependent  . allopurinol (ZYLOPRIM) 100 MG tablet Take 0.5 tablets (50 mg total) by mouth daily.  Marland Kitchen aspirin EC 325 MG tablet Take 1 tablet (325 mg total) by mouth daily.  Marland Kitchen atorvastatin (LIPITOR) 80 MG tablet TAKE 1 TABLET (80 MG TOTAL) DAILY AT 6 PM.  . Blood Glucose Calibration (ACCU-CHEK AVIVA) SOLN Use to calibrate blood glucose machine as recommended.  Diagnosis:  E11.21  Non-insulin dependent.  . Blood Glucose Monitoring Suppl (ACCU-CHEK GUIDE) w/Device KIT Use to check blood sugar once daily.  . carvedilol (COREG) 3.125 MG tablet Take 1 tablet (3.125 mg total) by mouth 2 (two) times daily.  . finasteride (PROSCAR) 5 MG tablet Take 1 tablet (5 mg total) by mouth daily.  . furosemide (LASIX) 20 MG tablet Take 1 tablet (20 mg total) by mouth daily.  Marland Kitchen gabapentin (NEURONTIN) 300 MG capsule Take 2 capsules (600 mg total) by mouth 2 (two) times daily.  Marland Kitchen glipiZIDE (GLUCOTROL XL) 10 MG 24 hr tablet Take 1 tablet (10 mg total) by mouth daily with breakfast.  . glucose blood (ACCU-CHEK AVIVA PLUS) test strip Test Blood Sugar 1 time daily: Dx:E11.27  . indomethacin (INDOCIN) 25 MG capsule Take 1 capsule (25 mg total) by mouth 3 (three) times daily as needed.  . sacubitril-valsartan (ENTRESTO) 24-26 MG Take 1 tablet by mouth 2 (two) times daily.  . tamsulosin (FLOMAX) 0.4 MG CAPS capsule TAKE 1 CAPSULE  EVERY DAY   No facility-administered encounter medications on file as of 04/20/2020.    Current Diagnosis: Patient Active Problem List   Diagnosis Date Noted  . Idiopathic gout of multiple sites 10/07/2018  . Grief 06/16/2018  . Dizziness 06/16/2018  . Allergic rhinitis 04/24/2018  . S/P CABG x 3 09/02/2017  . Leukocytosis 08/27/2017  . Visit for well man health check 05/07/2017  . COPD (chronic obstructive pulmonary disease) ([place]) 05/07/2017  . Chronic systolic CHF (congestive heart failure) ([place]) 10/18/2016  . Generalized OA 06/22/2014  . CKD (chronic kidney disease) stage 3, GFR 30-59 ml/min   . Synovial cyst of lumbar spine 07/17/2013    Class: Diagnosis of  . Osteoarthritis of left hip 09/17/2012  . Automatic implantable cardioverter-defibrillator in situ - St Jude 05/21/2012  . Chronic systolic heart failure ([place]) 05/01/2011  . IMPOTENCE OF ORGANIC ORIGIN 02/20/2010  . Unspecified vitamin D deficiency 02/14/2010  . Type 2 diabetes with nephropathy ([place]) 01/11/2010  . Implantable cardioverter-defibrillator (ICD) in situ 05/10/2009  . HLD (hyperlipidemia) 02/24/2008  . Essential hypertension 02/24/2008  . Non-ST elevation (NSTEMI) myocardial infarction ([place]) 02/24/2008  . HYPERSOMNIA UNSPECIFIED 02/24/2008    Goals Addressed   None    Reviewed chart for medication changes ahead of medication coordination call.  No OVs, Consults, or hospital visits since last Pharmacist visit on 02/29/2020 with Debbora Dus.  No medication changes indicated.  BP Readings from Last 3 Encounters:  01/06/20 Marland Kitchen)  94/58  11/20/19 (!) 100/58  11/04/19 124/80    Lab Results  Component Value Date   HGBA1C 7.8 (H) 11/20/2019     Patient obtains medications through Adherence Packaging  30 Days    Last adherence delivery included on 03/25/2020: Carvedilol 3.125 mg  - 1 tablet BID, Atorvastatin 80 mg - 1 tablet daily, Glipizide 10 mg ER - 1 tablet with breakfast, Entresto 24-26 mg  - 1  tablet BID, Furosemide 20 mg  - 1 tablet daily, Allopurinol 100 mg  -  tablet daily, Gabapentin 300 mg  - 2 caps BID, Tamsulosin 0.4 mg - 1 tablet daily, Finasteride 5 mg  - 1 tablet daily.  Patient declined Accu-check Aviva Plus Test strips due to receiving a 90 DS on 02/29/2020 prior to adherence delivery. Patient also declined the Indomethacin 25 mg 1 capsule three times a day due to using PRN, last prescription received on 01/05/2020.  Patient is due for next adherence delivery on: 04/22/20. Called patient and reviewed medications and coordinated delivery.  This delivery to include: Carvedilol 3.125 mg  - 1 tablet BID, Atorvastatin 80 mg - 1 tablet daily, Glipizide 10 mg ER - 1 tablet with breakfast, Entresto 24-26 mg  - 1 tablet BID, Furosemide 20 mg  - 1 tablet daily, Allopurinol 100 mg  -  tablet daily, Gabapentin 300 mg  - 2 caps BID, Tamsulosin 0.4 mg - 1 tablet daily, Finasteride 5 mg  - 1 tablet daily.  Patient declined the following medications: Accu-check Aviva Plus Test strips due to receiving a 90 DS on 02/29/2020 prior to adherence delivery. Patient also declined the Indomethacin 25 mg 1 capsule three times a day due to using PRN, last prescription received on 01/05/2020. Patient states will call if test strips or Indomethacin is needed.  Patient needs refills for  Carvedilol 3.125 mg  - 1 tablet BID, Atorvastatin 80 mg - 1 tablet daily, Glipizide 10 mg ER - 1 tablet with breakfast, Entresto 24-26 mg  - 1 tablet BID, Furosemide 20 mg  - 1 tablet daily, Allopurinol 100 mg  -  tablet daily, Gabapentin 300 mg  - 2 caps BID, Tamsulosin 0.4 mg - 1 tablet daily, Finasteride 5 mg  - 1 tablet daily.  Confirmed delivery date of 04/22/2020, advised patient that pharmacy will contact them the morning of delivery.   Follow-Up:  Coordination of Enhanced Pharmacy Services and Pharmacist Review - Patient will call if Indomethacin or Accu-check strips are needed. Patient is doing well  otherwise.  04/27/20- Patient called this morning, missed call. Called patient back no ans, left message to return call. Will try again this afternoon, just double checking to see if he needs Indomethacin or Accu-check. Missed patient return call. Voicemail left no additional medication needs mentioned.   Donette Larry, CPP notified.  Pattricia Boss, [place] Pharmacist Assistant [phone]

## 2020-05-02 ENCOUNTER — Ambulatory Visit (INDEPENDENT_AMBULATORY_CARE_PROVIDER_SITE_OTHER): Payer: Medicare (Managed Care)

## 2020-05-02 DIAGNOSIS — Z95810 Presence of automatic (implantable) cardiac defibrillator: Secondary | ICD-10-CM | POA: Diagnosis not present

## 2020-05-02 DIAGNOSIS — I5022 Chronic systolic (congestive) heart failure: Secondary | ICD-10-CM | POA: Diagnosis not present

## 2020-05-02 NOTE — Progress Notes (Signed)
EPIC Encounter for ICM Monitoring  Patient Name: Randy [name] is a 74 y.o. male Date: 05/02/2020 Primary Care Physican: Elby Beck, FNP Primary Cardiologist:Taylor Electrophysiologist:Taylor 10/4/2021Weight: 180lbs   AT/AF Burden: <1% (taking Aspirin)  Spoke with patient and reports feeling well at this time.  Denies fluid symptoms.  He does not salt foods but is not aware of amount of salt in foods. He eats at restaurant foods occasionally and fluid intake is <64 oz daily  Corvue thoracic impedancesuggesting possible fluid accumulation starting 04/28/2020.   Prescribed: Furosemide20 mg 1 tablet daily  Labs: 08/05/2019 Creatinine 1.94, BUN 39, Potassium 5.1, Sodium 137, GFR 34.02 06/10/2019 Creatinine 2.65, BUN 42, Potassium 5.1, Sodium 139 A complete set of results can be found in Results Review.  Recommendations:Recommendation to limit salt intake to 2000 mg daily and fluid intake to 64 oz daily.  Encouraged to call if experiencing any fluid symptoms.   Follow-up plan: ICM clinic phone appointment on10/06/2020 to recheck fluid levels.91 day device clinic remote transmission scheduled for12/08/2019.  EP/Cardiology Office Visits:Recall 6/6/2022with Dr. Lovena Le.   Copy of ICM check sent to Dr.Taylor.    3 month ICM trend: 05/02/2020    1 Year ICM trend:       Rosalene Billings, RN 05/02/2020 1:43 PM

## 2020-05-10 ENCOUNTER — Ambulatory Visit (INDEPENDENT_AMBULATORY_CARE_PROVIDER_SITE_OTHER): Payer: Medicare (Managed Care)

## 2020-05-10 DIAGNOSIS — I5022 Chronic systolic (congestive) heart failure: Secondary | ICD-10-CM

## 2020-05-10 DIAGNOSIS — Z95810 Presence of automatic (implantable) cardiac defibrillator: Secondary | ICD-10-CM

## 2020-05-10 NOTE — Progress Notes (Signed)
EPIC Encounter for ICM Monitoring  Patient Name: Randy [name] is a 74 y.o. male Date: 05/10/2020 Primary Care Physican: Elby Beck, FNP Primary Cardiologist:Taylor Electrophysiologist:Taylor 10/12/2021Weight: 180lbs   AT/AF Burden: <1% (taking Aspirin)  Spoke with patient and reports feeling well at this time.  Denies fluid symptoms.   Corvue thoracic impedancesuggesting fluid levels returned to normal.  Prescribed: Furosemide20 mg 1 tablet daily  Labs: 08/05/2019 Creatinine 1.94, BUN 39, Potassium 5.1, Sodium 137, GFR 34.02 06/10/2019 Creatinine 2.65, BUN 42, Potassium 5.1, Sodium 139 A complete set of results can be found in Results Review.  Recommendations:Encouraged to call if experiencing any fluid symptoms.   Follow-up plan: ICM clinic phone appointment on11/02/2020.91 day device clinic remote transmission scheduled for12/08/2019.  EP/Cardiology Office Visits:Recall 6/6/2022with Dr. Lovena Le.   Copy of ICM check sent to Dr.Taylor.    3 month ICM trend: 05/10/2020    1 Year ICM trend:       Rosalene Billings, RN 05/10/2020 3:14 PM

## 2020-05-22 ENCOUNTER — Telehealth: Payer: Self-pay

## 2020-05-22 NOTE — Chronic Care Management (AMB) (Signed)
Chronic Care Management Pharmacy Assistant   Name: Randy [name]  MRN: [id number] DOB: 10-22-1945  Reason for Encounter: Medication Review/ Monthly Dispensing Call  Patient Questions:  1.  Have you seen any other providers since your last visit? No  2.  Any changes in your medicines or health? No   PCP : Elby Beck, FNP  Allergies:  No Known Allergies  Medications: Outpatient Encounter Medications as of 05/22/2020  Medication Sig  . Accu-Chek Softclix Lancets lancets USE AS DIRECTED  TO TEST BLOOD SUGAR ONE TIME DAILY  AND AS NEEDED  . Alcohol Swabs (B-D SINGLE USE SWABS REGULAR) PADS Use as instructed to clean area for glucose monitoring once daily and as needed.  Diagnosis:  E11.21  Non insulin-dependent  . allopurinol (ZYLOPRIM) 100 MG tablet Take 0.5 tablets (50 mg total) by mouth daily.  Marland Kitchen aspirin EC 325 MG tablet Take 1 tablet (325 mg total) by mouth daily.  Marland Kitchen atorvastatin (LIPITOR) 80 MG tablet TAKE 1 TABLET (80 MG TOTAL) DAILY AT 6 PM.  . Blood Glucose Calibration (ACCU-CHEK AVIVA) SOLN Use to calibrate blood glucose machine as recommended.  Diagnosis:  E11.21  Non-insulin dependent.  . Blood Glucose Monitoring Suppl (ACCU-CHEK GUIDE) w/Device KIT Use to check blood sugar once daily.  . carvedilol (COREG) 3.125 MG tablet Take 1 tablet (3.125 mg total) by mouth 2 (two) times daily.  . finasteride (PROSCAR) 5 MG tablet Take 1 tablet (5 mg total) by mouth daily.  . furosemide (LASIX) 20 MG tablet Take 1 tablet (20 mg total) by mouth daily.  Marland Kitchen gabapentin (NEURONTIN) 300 MG capsule Take 2 capsules (600 mg total) by mouth 2 (two) times daily.  Marland Kitchen glipiZIDE (GLUCOTROL XL) 10 MG 24 hr tablet Take 1 tablet (10 mg total) by mouth daily with breakfast.  . glucose blood (ACCU-CHEK AVIVA PLUS) test strip Test Blood Sugar 1 time daily: Dx:E11.27  . indomethacin (INDOCIN) 25 MG capsule Take 1 capsule (25 mg total) by mouth 3 (three) times daily as needed.  .  sacubitril-valsartan (ENTRESTO) 24-26 MG Take 1 tablet by mouth 2 (two) times daily.  . tamsulosin (FLOMAX) 0.4 MG CAPS capsule TAKE 1 CAPSULE EVERY DAY   No facility-administered encounter medications on file as of 05/22/2020.    Current Diagnosis: Patient Active Problem List   Diagnosis Date Noted  . Idiopathic gout of multiple sites 10/07/2018  . Grief 06/16/2018  . Dizziness 06/16/2018  . Allergic rhinitis 04/24/2018  . S/P CABG x 3 09/02/2017  . Leukocytosis 08/27/2017  . Visit for well man health check 05/07/2017  . COPD (chronic obstructive pulmonary disease) ([place]) 05/07/2017  . Chronic systolic CHF (congestive heart failure) ([place]) 10/18/2016  . Generalized OA 06/22/2014  . CKD (chronic kidney disease) stage 3, GFR 30-59 ml/min (HCC)   . Synovial cyst of lumbar spine 07/17/2013    Class: Diagnosis of  . Osteoarthritis of left hip 09/17/2012  . Automatic implantable cardioverter-defibrillator in situ - [employer] Jude 05/21/2012  . Chronic systolic heart failure ([place]) 05/01/2011  . IMPOTENCE OF ORGANIC ORIGIN 02/20/2010  . Unspecified vitamin D deficiency 02/14/2010  . Type 2 diabetes with nephropathy ([place]) 01/11/2010  . Implantable cardioverter-defibrillator (ICD) in situ 05/10/2009  . HLD (hyperlipidemia) 02/24/2008  . Essential hypertension 02/24/2008  . Non-ST elevation (NSTEMI) myocardial infarction ([place]) 02/24/2008  . HYPERSOMNIA UNSPECIFIED 02/24/2008   Reviewed chart for medication changes ahead of medication coordination call.  No OVs, Consults, or hospital visits since last care coordination call/Pharmacist  visit. No medication changes indicated.  BP Readings from Last 3 Encounters:  01/06/20 (!) 94/58  11/20/19 (!) 100/58  11/04/19 124/80    Lab Results  Component Value Date   HGBA1C 7.8 (H) 11/20/2019     Patient obtains medications through Vials  30 Days   Last adherence delivery included: Carvedilol 3.125 mg  - 1 tablet BID, Atorvastatin 80 mg - 1 tablet  daily, Glipizide 10 mg ER - 1 tablet with breakfast, Entresto 24-26 mg  - 1 tablet BID, Furosemide 20 mg  - 1 tablet daily, Allopurinol 100 mg  -  tablet daily, Gabapentin 300 mg  - 2 caps BID, Tamsulosin 0.4 mg - 1 tablet daily, Finasteride 5 mg  - 1 tablet daily.  Patient declined  Indomethacin 25 mg- 1 capsule three times daily last month due to PRN use and Accu-chek test strips and lancets to check blood sugars once a day due to having an adequate supply on hand.    Patient is due for next adherence delivery on: 05/23/2020. Called patient and reviewed medications and coordinated delivery.  This delivery to include: Carvedilol 3.125 mg  - 1 tablet BID, Atorvastatin 80 mg - 1 tablet daily, Glipizide 10 mg ER - 1 tablet with breakfast, Entresto 24-26 mg  - 1 tablet BID, Furosemide 20 mg  - 1 tablet daily, Allopurinol 100 mg  -  tablet daily, Gabapentin 300 mg  - 2 caps BID, Tamsulosin 0.4 mg - 1 tablet daily, Finasteride 5 mg  - 1 tablet daily.  Short fill or acute fill not needed.  Patient declined the following medications:don't Indomethacin 25 mg- 1 capsule three times daily due to PRN use and Accu-chek test strips and lancets to check blood sugars once a day due to having an adequate supply on hand.     Patient needs refills for:  Carvedilol 3.125 mg  - 1 tablet BID, Glipizide 10 mg ER - 1 tablet with breakfast, Entresto 24-26 mg  - 1 tablet BID, Furosemide 20 mg  - 1 tablet daily, Allopurinol 100 mg  -  tablet daily, Gabapentin 300 mg  - 2 caps BID, Tamsulosin 0.4 mg - 1 tablet daily, Finasteride 5 mg  - 1 tablet daily.  Confirmed delivery date of 05/23/2020, advised patient that pharmacy will contact them the morning of delivery.  Follow-Up:  Comptroller and Pharmacist Review  Donette Larry, CPP notified.  Pattricia Boss, [place] Pharmacist Assistant [phone]

## 2020-05-23 ENCOUNTER — Telehealth: Payer: Self-pay

## 2020-05-23 DIAGNOSIS — E790 Hyperuricemia without signs of inflammatory arthritis and tophaceous disease: Secondary | ICD-10-CM

## 2020-05-23 DIAGNOSIS — E1121 Type 2 diabetes mellitus with diabetic nephropathy: Secondary | ICD-10-CM

## 2020-05-23 MED ORDER — FINASTERIDE 5 MG PO TABS: 5.0000 mg | ORAL_TABLET | Freq: Every day | ORAL | 0 refills | Status: DC

## 2020-05-23 MED ORDER — ALLOPURINOL 100 MG PO TABS: 50.0000 mg | ORAL_TABLET | Freq: Every day | ORAL | 0 refills | Status: DC

## 2020-05-23 MED ORDER — GLIPIZIDE ER 10 MG PO TB24: 10.0000 mg | ORAL_TABLET | Freq: Every day | ORAL | 0 refills | Status: DC

## 2020-05-23 MED ORDER — GABAPENTIN 300 MG PO CAPS: 600.0000 mg | ORAL_CAPSULE | Freq: Two times a day (BID) | ORAL | 0 refills | Status: DC

## 2020-05-23 MED ORDER — TAMSULOSIN HCL 0.4 MG PO CAPS: 0.4000 mg | ORAL_CAPSULE | Freq: Every day | ORAL | 0 refills | Status: DC

## 2020-05-23 MED ORDER — CARVEDILOL 3.125 MG PO TABS: 3.1250 mg | ORAL_TABLET | Freq: Two times a day (BID) | ORAL | 0 refills | Status: DC

## 2020-05-23 NOTE — Telephone Encounter (Signed)
-----   Message from Burnice Logan, [facility] sent at 05/23/2020  9:24 AM EDT ----- Regarding: Refill Request for [facility] Randy [name],   I hope your Monday is off to a great start.   Randy [name] needs refills for the following medications sent to [facility] for delivery if possible:   Carvedilol Glipizide Entresto  Furosemide Allopurinol Gabapentin Tamsulosin Finasteride  Thank you,  Emeterio Reeve

## 2020-05-23 NOTE — Telephone Encounter (Signed)
Called pt, scheduled 6 months f/u visit as per Tor Netters during annual exam 10/2019.  Refilled medication x30

## 2020-05-30 ENCOUNTER — Ambulatory Visit (INDEPENDENT_AMBULATORY_CARE_PROVIDER_SITE_OTHER): Payer: Medicare (Managed Care) | Admitting: Family Medicine

## 2020-05-30 ENCOUNTER — Encounter: Payer: Self-pay | Admitting: Family Medicine

## 2020-05-30 ENCOUNTER — Other Ambulatory Visit: Payer: Self-pay | Admitting: Family Medicine

## 2020-05-30 ENCOUNTER — Other Ambulatory Visit: Payer: Self-pay

## 2020-05-30 VITALS — BP 116/62 | HR 78 | Temp 96.9°F | Ht 66.75 in | Wt 182.0 lb

## 2020-05-30 DIAGNOSIS — E782 Mixed hyperlipidemia: Secondary | ICD-10-CM | POA: Diagnosis not present

## 2020-05-30 DIAGNOSIS — E1121 Type 2 diabetes mellitus with diabetic nephropathy: Secondary | ICD-10-CM

## 2020-05-30 DIAGNOSIS — M1A09X1 Idiopathic chronic gout, multiple sites, with tophus (tophi): Secondary | ICD-10-CM

## 2020-05-30 DIAGNOSIS — Z1211 Encounter for screening for malignant neoplasm of colon: Secondary | ICD-10-CM

## 2020-05-30 DIAGNOSIS — Z23 Encounter for immunization: Secondary | ICD-10-CM | POA: Diagnosis not present

## 2020-05-30 DIAGNOSIS — I5022 Chronic systolic (congestive) heart failure: Secondary | ICD-10-CM | POA: Diagnosis not present

## 2020-05-30 LAB — CBC WITH DIFFERENTIAL/PLATELET
Basophils Absolute: 0.1 10*3/uL (ref 0.0–0.1)
Basophils Relative: 0.7 % (ref 0.0–3.0)
Eosinophils Absolute: 0.2 10*3/uL (ref 0.0–0.7)
Eosinophils Relative: 2.1 % (ref 0.0–5.0)
HCT: 33.4 % — ABNORMAL LOW (ref 39.0–52.0)
Hemoglobin: 11.1 g/dL — ABNORMAL LOW (ref 13.0–17.0)
Lymphocytes Relative: 20 % (ref 12.0–46.0)
Lymphs Abs: 1.6 10*3/uL (ref 0.7–4.0)
MCHC: 33.2 g/dL (ref 30.0–36.0)
MCV: 84.8 fl (ref 78.0–100.0)
Monocytes Absolute: 0.7 10*3/uL (ref 0.1–1.0)
Monocytes Relative: 9.6 % (ref 3.0–12.0)
Neutro Abs: 5.3 10*3/uL (ref 1.4–7.7)
Neutrophils Relative %: 67.6 % (ref 43.0–77.0)
Platelets: 176 10*3/uL (ref 150.0–400.0)
RBC: 3.95 Mil/uL — ABNORMAL LOW (ref 4.22–5.81)
RDW: 15.2 % (ref 11.5–15.5)
WBC: 7.8 10*3/uL (ref 4.0–10.5)

## 2020-05-30 LAB — COMPREHENSIVE METABOLIC PANEL
ALT: 6 U/L (ref 0–53)
AST: 12 U/L (ref 0–37)
Albumin: 4.1 g/dL (ref 3.5–5.2)
Alkaline Phosphatase: 53 U/L (ref 39–117)
BUN: 26 mg/dL — ABNORMAL HIGH (ref 6–23)
CO2: 26 mEq/L (ref 19–32)
Calcium: 9.1 mg/dL (ref 8.4–10.5)
Chloride: 102 mEq/L (ref 96–112)
Creatinine, Ser: 1.63 mg/dL — ABNORMAL HIGH (ref 0.40–1.50)
GFR: 41.24 mL/min — ABNORMAL LOW (ref >60.00)
Glucose, Bld: 131 mg/dL — ABNORMAL HIGH (ref 70–99)
Potassium: 4.4 mEq/L (ref 3.5–5.1)
Sodium: 140 mEq/L (ref 135–145)
Total Bilirubin: 0.8 mg/dL (ref 0.2–1.2)
Total Protein: 6.8 g/dL (ref 6.0–8.3)

## 2020-05-30 LAB — HEMOGLOBIN A1C: Hgb A1c MFr Bld: 7.9 % — ABNORMAL HIGH (ref 4.6–6.5)

## 2020-05-30 LAB — URIC ACID: Uric Acid, Serum: 8.3 mg/dL — ABNORMAL HIGH (ref 4.0–7.8)

## 2020-05-30 LAB — LIPID PANEL
Cholesterol: 219 mg/dL — ABNORMAL HIGH (ref 0–200)
HDL: 38.5 mg/dL — ABNORMAL LOW (ref >39.00)
LDL Cholesterol: 145 mg/dL — ABNORMAL HIGH (ref 0–99)
NonHDL: 180.11
Total CHOL/HDL Ratio: 6
Triglycerides: 178 mg/dL — ABNORMAL HIGH (ref 0.0–149.0)
VLDL: 35.6 mg/dL (ref 0.0–40.0)

## 2020-05-30 NOTE — Patient Instructions (Signed)
Good to see you today  Please schedule your 6 month follow up for annual exam  Get your Covid vaccine at your pharmacy. Get tetanus after you finish your Covid series- at your pharmacy as well  I will send in your refills after I get your labs back

## 2020-05-30 NOTE — Progress Notes (Signed)
Subjective:    Patient ID: Randy [name], male    DOB: 09/18/1945, 74 y.o.   MRN: [id number]  HPI Chief Complaint  Patient presents with  . Follow-up    6 month f/u   This is a 74 yo male who presents today for follow up of diabetes mellitus type 2, CAD, gout, hyperlipidemia. Has been doing well. Playing a little golf. Son lives nearby. Getting medications through [facility].   DM type 2- no low blood sugars, little protein, eats multiple snacks throughout the day, avoids sweets, no drinks with sugar. Eats sandwiches. No recent falls, feet not bothering him (neupropathy)  CAD/CHF- no chest pain, no leg swelling, no SOB, cough, wheeze. Had cardiology visit 7/21.   Gout- no recent flares  Hyperlipidemia- taking atorvastatin 80 mg, due labs today. Denies side effects.   Colon cancer screening- overdue, not sure that he received recall letter.   Covid vaccine- has not had, no objections to getting  Review of Systems Per HPI    Objective:   Physical Exam Physical Exam  Constitutional: Oriented to person, place, and time. Appears well-developed and well-nourished.  HENT:  Head: Normocephalic and atraumatic.  Eyes: Conjunctivae are normal.  Neck: Normal range of motion. Neck supple.  Cardiovascular: Normal rate, regular rhythm and normal heart sounds.   Pulmonary/Chest: Effort normal and breath sounds normal.  Musculoskeletal: No lower extremity edema.   Neurological: Alert and oriented to person, place, and time.  Skin: Skin is warm and dry.  Psychiatric: Normal mood and affect. Behavior is normal. Judgment and thought content normal.  Vitals reviewed.     BP 116/62   Pulse 78   Temp (!) 96.9 F (36.1 C) (Temporal)   Ht 5' 6.75" (1.695 m)   Wt 182 lb (82.6 kg)   SpO2 94%   BMI 28.72 kg/m  Wt Readings from Last 3 Encounters:  05/30/20 182 lb (82.6 kg)  01/06/20 184 lb (83.5 kg)  11/20/19 187 lb (84.8 kg)   MMSE - Mini Mental State Exam 05/30/2020  04/22/2017 03/09/2016  Orientation to time 3 5 5   Orientation to Place 5 5 5   Registration 3 3 3   Attention/ Calculation 5 0 0  Recall 0 2 3  Recall-comments - unable to recall 1 of 3 words -  Language- name 2 objects 2 0 0  Language- repeat 1 1 1   Language- follow 3 step command 3 3 3   Language- read & follow direction 1 0 0  Write a sentence 1 0 0  Copy design 0 0 0  Total score 24 19 20         Assessment & Plan:  1. Type 2 diabetes with nephropathy (HCC) - CBC with Differential/Platelet - Comprehensive metabolic panel - Hemoglobin A1c  2. Mixed hyperlipidemia - CBC with Differential/Platelet - Comprehensive metabolic panel - Lipid panel  3. Chronic systolic heart failure (HCC) - CBC with Differential/Platelet - Comprehensive metabolic panel - denies any symptoms fluid overload, no leg swelling on exam  4. Need for influenza vaccination - Flu Vaccine QUAD High Dose(Fluad)  5. Idiopathic chronic gout of multiple sites with tophus - no recent flares, using indomethacin PRN - Uric Acid  6. Need for COVID-19 vaccine - advised him to receive at his local pharmacy  7. Screening for colon cancer - overdue, needs office visit with GI, printed letter for him to call office to schedule  This visit occurred during the SARS-CoV-2 public health emergency.  Safety protocols were  in place, including screening questions prior to the visit, additional usage of staff PPE, and extensive cleaning of exam room while observing appropriate contact time as indicated for disinfecting solutions.    Clarene Reamer, FNP-BC  [facility], [facility]  05/30/2020 8:50 AM

## 2020-06-06 ENCOUNTER — Encounter: Payer: Self-pay | Admitting: Family Medicine

## 2020-06-06 ENCOUNTER — Ambulatory Visit (INDEPENDENT_AMBULATORY_CARE_PROVIDER_SITE_OTHER): Payer: Medicare (Managed Care)

## 2020-06-06 DIAGNOSIS — I5022 Chronic systolic (congestive) heart failure: Secondary | ICD-10-CM | POA: Diagnosis not present

## 2020-06-06 DIAGNOSIS — Z95810 Presence of automatic (implantable) cardiac defibrillator: Secondary | ICD-10-CM | POA: Diagnosis not present

## 2020-06-10 ENCOUNTER — Telehealth: Payer: Self-pay

## 2020-06-10 NOTE — Telephone Encounter (Signed)
Remote ICM transmission received.  Attempted call to patient regarding ICM remote transmission and left detailed message per DPR.  Advised to return call for any fluid symptoms or questions. Next ICM remote transmission scheduled 07/11/2020.

## 2020-06-10 NOTE — Progress Notes (Signed)
EPIC Encounter for ICM Monitoring  Patient Name: Randy [name] is a 74 y.o. male Date: 06/10/2020 Primary Care Physican: Elby Beck, FNP Primary Cardiologist:Taylor Electrophysiologist:Taylor 10/12/2021Weight: 180lbs   AT/AF Burden: <1% (taking Aspirin)  Attempted call to patient and unable to reach.  Left detailed message per DPR regarding transmission. Transmission reviewed.   [place] thoracic impedancenormal fluid levels but was suggesting possible fluid accumulation from 10/21-10/30.  Prescribed: Furosemide20 mg 1 tablet daily  Labs: 05/30/2020 Creatinine 1.63, BUN 26, Potassium 4.4, Sodium 140, GFR 41.24 08/05/2019 Creatinine 1.94, BUN 39, Potassium 5.1, Sodium 137, GFR 34.02 06/10/2019 Creatinine 2.65, BUN 42, Potassium 5.1, Sodium 139 A complete set of results can be found in Results Review.  Recommendations:Left voice mail with ICM number and encouraged to call if experiencing any fluid symptoms.  Follow-up plan: ICM clinic phone appointment on12/13/2021.91 day device clinic remote transmission scheduled for12/08/2019.  EP/Cardiology Office Visits:Recall 6/6/2022with Dr. Lovena Le.   Copy of ICM check sent to Dr.Taylor.   3 month ICM trend: 06/06/2020    1 Year ICM trend:       Rosalene Billings, RN 06/10/2020 3:23 PM

## 2020-06-15 ENCOUNTER — Ambulatory Visit: Payer: Self-pay

## 2020-06-15 ENCOUNTER — Telehealth: Payer: Self-pay

## 2020-06-15 DIAGNOSIS — E782 Mixed hyperlipidemia: Secondary | ICD-10-CM

## 2020-06-15 DIAGNOSIS — I10 Essential (primary) hypertension: Secondary | ICD-10-CM

## 2020-06-15 DIAGNOSIS — E1121 Type 2 diabetes mellitus with diabetic nephropathy: Secondary | ICD-10-CM

## 2020-06-15 NOTE — Chronic Care Management (AMB) (Signed)
Chronic Care Management Pharmacy  Name: Randy [name]  MRN: [id number] DOB: 1945/09/13  Chief Complaint/ HPI  Randy [name],  74 y.o. , male presents for their Follow-Up CCM visit with the clinical pharmacist via telephone.  PCP : Elby Beck, FNP  Their chronic conditions include: hypertension, CHF, COPD, allergic rhinitis, type 2 DM, OA, CKD stage 3, vitamin D deficiency, HLD, idiopathic gout, PAF, CAD  Concerns: PCP sent CCM pharmacist lab note concerned about pt adherence to statin due to LDL elevation 06/09/20.   Office Visits:  05/30/20: [name] - AWV, no med changes  11/20/19: [place], DM - does not check BG, A1c 8%, unsure about adherence to allopurinol and atorvastatin  11/04/19: [name] - Bilateral elbow pain/gout - start allopurinol and indomethacin short term, increase activity   10/21/19: [name] - Pain in both feet, pt suspects gout and taking indomethacin from a friend, check uric acid level  08/21/19: [name] - DM, change glipizide 5 mg BID to 10 mg XL, discussed adding Trulicity but pt declines injectables, declines referral to lipid clinic, hypotension, asymptomatic, refer to cardio  Consult Visit:  10/19/19: Cardiology - CHF, note unavailable   No Known Allergies  Medications: Outpatient Encounter Medications as of 06/15/2020  Medication Sig  . Accu-Chek Softclix Lancets lancets USE AS DIRECTED  TO TEST BLOOD SUGAR ONE TIME DAILY  AND AS NEEDED  . Alcohol Swabs (B-D SINGLE USE SWABS REGULAR) PADS Use as instructed to clean area for glucose monitoring once daily and as needed.  Diagnosis:  E11.21  Non insulin-dependent  . allopurinol (ZYLOPRIM) 100 MG tablet Take 0.5 tablets (50 mg total) by mouth daily.  Marland Kitchen aspirin EC 325 MG tablet Take 1 tablet (325 mg total) by mouth daily.  Marland Kitchen atorvastatin (LIPITOR) 80 MG tablet TAKE 1 TABLET (80 MG TOTAL) DAILY AT 6 PM.  . Blood Glucose Calibration (ACCU-CHEK AVIVA) SOLN Use to calibrate blood glucose machine  as recommended.  Diagnosis:  E11.21  Non-insulin dependent.  . Blood Glucose Monitoring Suppl (ACCU-CHEK GUIDE) w/Device KIT Use to check blood sugar once daily.  . carvedilol (COREG) 3.125 MG tablet Take 1 tablet (3.125 mg total) by mouth 2 (two) times daily.  . finasteride (PROSCAR) 5 MG tablet Take 1 tablet (5 mg total) by mouth daily.  Marland Kitchen glipiZIDE (GLUCOTROL XL) 10 MG 24 hr tablet Take 1 tablet (10 mg total) by mouth daily with breakfast.  . glucose blood (ACCU-CHEK AVIVA PLUS) test strip Test Blood Sugar 1 time daily: Dx:E11.27  . indomethacin (INDOCIN) 25 MG capsule Take 1 capsule (25 mg total) by mouth 3 (three) times daily as needed.  . tamsulosin (FLOMAX) 0.4 MG CAPS capsule Take 1 capsule (0.4 mg total) by mouth daily.  . [DISCONTINUED] furosemide (LASIX) 20 MG tablet Take 1 tablet (20 mg total) by mouth daily.  . [DISCONTINUED] gabapentin (NEURONTIN) 300 MG capsule Take 2 capsules (600 mg total) by mouth 2 (two) times daily.  . [DISCONTINUED] sacubitril-valsartan (ENTRESTO) 24-26 MG Take 1 tablet by mouth 2 (two) times daily.   No facility-administered encounter medications on file as of 06/15/2020.   Current Diagnosis/Assessment:  Goals    . Increase physical activity     Starting 04/22/2017, I will continue to play golf 4-5 hours weekly.     Marland Kitchen Pharmacy Care Plan     CARE PLAN ENTRY  Current Barriers:  . Chronic Disease Management support, education, and care coordination needs related to Hypertension, Hyperlipidemia, Diabetes, and Gout  Hypertension . Pharmacist Clinical Goal(s): o Over the next 3 months, patient will work with PharmD and providers to maintain BP goal <130/80 mmHg . Current regimen:  o Carvedilol 3.125 mg - 1 tablet twice daily . Interventions: o Assessed control, clinic readings within goal; no dizziness or falls . Patient self care activities - Over the next 3 months, patient will: o Check home blood pressure monthly, document, and provide at future  appointments o Ensure daily salt intake < 2300 mg/day  Hyperlipidemia Last lipids Lab Results  Component Value Date   CHOL 219 (H) 05/30/2020   HDL 38.50 (L) 05/30/2020   LDLCALC 145 (H) 05/30/2020   LDLDIRECT 101.0 08/05/2019   TRIG 178.0 (H) 05/30/2020   CHOLHDL 6 05/30/2020   . Pharmacist Clinical Goal(s): o Over the next 3 months, patient will work with PharmD and providers to achieve LDL goal < 70, triglycerides < 150, HDL > 40 . Current regimen:  o Atorvastatin 80 mg - 1 tablet daily . Interventions: o LDL very high despite pt confirming adherence o Recommend Zetia 10 mg daily and adherence packaging  . Patient self care activities - Over the next 3 months, patient will: . Slowly increase exercise with goal of 30 minutes, 5 days per week . Incorporate a healthy diet high in vegetables, fruits and whole grains with low-fat dairy products, chicken, fish, legumes, non-tropical vegetable oils and nuts. Limit intake of sweets, sugar-sweetened beverages and red meats.  Diabetes Recent Relevant Labs: Lab Results  Component Value Date/Time   HGBA1C 7.9 (H) 05/30/2020 08:39 AM   HGBA1C 7.8 (H) 11/20/2019 04:27 PM   HGBA1C 6.4 06/16/2018 09:19 AM   MICROALBUR 2.0 (H) 08/05/2019 09:36 AM   MICROALBUR 0.9 06/16/2018 09:35 AM   Pharmacist Clinical Goal(s): o Over the next 3 months, patient will work with PharmD and providers to achieve A1c goal <7% . Current regimen:  o Glipizide 10 mg ER - 1 tablet daily with breakfast . Interventions: o Recommend routine blood glucose monitoring to assess diabetes control o Recommend starting a GLP-1 agonist such as Ozempic weekly to improve control. Alternatives limited due to CKD. Marland Kitchen Patient self care activities - Over the next 3 months, patient will: o Check blood sugar daily before breakfast, document, and provide at future appointment with pharmacist o Contact provider with any episodes of hypoglycemia  Gout . Pharmacist Clinical  Goal(s) o Over the next 3 months, patient will work with PharmD and providers to reduce gout flares and assess uric acid  . Current regimen:  o Allopurinol 100 mg - take 1/2 tablet daily  . Interventions: o Reviewed labs - uric acid remains elevated, no flares o Avoid NSAIDs and caution with dose titration of allopurinol with CKD . Patient self care activities - Over the next 3 months, patient will: o Continue allopurinol, taking one-half tablet daily o Call if any gout flares  o Avoid over the counter anti-inflammatories (NSAIDs) due to kidney impairment (Advil, Aleve, Goody's powder, aspirin containing products)  Please see past updates related to this goal by clicking on the "Past Updates" button in the selected goal         Hypertension   CMP Latest Ref Rng & Units 05/30/2020 08/05/2019 06/10/2019  Glucose 70 - 99 mg/dL 131(H) 147(H) 120(H)  BUN 6 - 23 mg/dL 26(H) 39(H) 42(H)  Creatinine 0.40 - 1.50 mg/dL 1.63(H) 1.94(H) 2.65(H)  Sodium 135 - 145 mEq/L 140 137 139  Potassium 3.5 - 5.1 mEq/L 4.4 5.1 5.1  Chloride 96 - 112 mEq/L 102 101 101  CO2 19 - 32 mEq/L '26 28 22  ' Calcium 8.4 - 10.5 mg/dL 9.1 9.1 9.1  Total Protein 6.0 - 8.3 g/dL 6.8 - -  Total Bilirubin 0.2 - 1.2 mg/dL 0.8 - -  Alkaline Phos 39 - 117 U/L 53 - -  AST 0 - 37 U/L 12 - -  ALT 0 - 53 U/L 6 - -   Office blood pressures are: BP Readings from Last 3 Encounters:  05/30/20 116/62  01/06/20 (!) 94/58  11/20/19 (!) 100/58   Patient has failed these meds in the past: none reported Patient checks BP at home infrequently  BP goal < 130/80 mmHg  Patient is currently controlled on the following medications:   Carvedilol 3.125 mg - 1 tablet BID (refills timely)  Update 06/15/20: BP within goal in clinic 05/30/20, borderline low. Patient denies dizziness/falls.   Plan: Continue current medications  Atrial Fibrillation   CBC    Component Value Date/Time   WBC 7.8 05/30/2020 0839   RBC 3.95 (L) 05/30/2020  0839   HGB 11.1 (L) 05/30/2020 0839   HCT 33.4 (L) 05/30/2020 0839   PLT 176.0 05/30/2020 0839   MCV 84.8 05/30/2020 0839   MCH 28.8 09/06/2017 0323   MCHC 33.2 05/30/2020 0839   RDW 15.2 05/30/2020 0839   LYMPHSABS 1.6 05/30/2020 0839   MONOABS 0.7 05/30/2020 0839   EOSABS 0.2 05/30/2020 0839   BASOSABS 0.1 05/30/2020 0839   Lab Results  Component Value Date   CREATININE 1.63 (H) 05/30/2020   BUN 26 (H) 05/30/2020   GFR 41.24 (L) 05/30/2020   GFRNONAA 23 (L) 06/10/2019   GFRAA 26 (L) 06/10/2019   NA 140 05/30/2020   K 4.4 05/30/2020   CALCIUM 9.1 05/30/2020   CO2 26 05/30/2020   No Known Allergies  Last cardio visit 01/06/20 note -  1.  VT - he has not had any additional VT. He will follow up in several months. 2. ICD - his St. Jude DDD ICD is working normally.  3. PAF - he is maintaining NSR. I considered restarting amio but we will hold off for now. 4. Chronic systolic heart failure - he has class 2 symptoms. I encouraged him to increase his level of activity. He will avoid salty foods.  Reports pacemaker and defibrillator placement  Patient has tried these meds in past: amiodarone - discontinued per cardio 12/2019, aspirin 325 - pt discontinued Patient is currently on the following medications:   No pharmacotherapy  Update 06/15/20:  CHADSVASC:  4  CHF - 1 HTN - 1 Age 17 + - 0 Age 62-74 - 1 Diabetes - 0 Stroke/TIA - 0 Vascular Disease - 1 Male - 0  HASBLED: 3 Abnormal renal function Age > 38 Drug - NSAIDs  Plan: Consult cardiology regarding starting anticoagulation.   Hyperlipidemia/CAD   Lipid Panel     Component Value Date/Time   CHOL 219 (H) 05/30/2020 0839   TRIG 178.0 (H) 05/30/2020 0839   HDL 38.50 (L) 05/30/2020 0839   LDLCALC 145 (H) 05/30/2020 0839   LDLDIRECT 101.0 08/05/2019 0930    LDL goal < 70 Patient has failed these meds in past: none reported Patient is currently uncontrolled on the following medications:  . Atorvastatin 80  mg - 1 tablet daily  Update 06/15/20: Reviewed adherence - pt has been refilling timely through [facility], but has about 20 tablets atorvastatin 80 mg on hand. Recommended switching from vials to  adherence packs. Pt agrees. He is open to adding additional therapy - recommend adding Zetia. Continue to work on diabetes control.   Plan: Continue current medications; Recommend adding Zetia 10 mg daily per PCP consult.   Diabetes   Lab Results  Component Value Date   CREATININE 1.63 (H) 05/30/2020   BUN 26 (H) 05/30/2020   GFR 41.24 (L) 05/30/2020   GFRNONAA 23 (L) 06/10/2019   GFRAA 26 (L) 06/10/2019   NA 140 05/30/2020   K 4.4 05/30/2020   CALCIUM 9.1 05/30/2020   CO2 26 05/30/2020   Recent Relevant Labs: Lab Results  Component Value Date/Time   HGBA1C 7.9 (H) 05/30/2020 08:39 AM   HGBA1C 7.8 (H) 11/20/2019 04:27 PM   HGBA1C 6.4 06/16/2018 09:19 AM   MICROALBUR 2.0 (H) 08/05/2019 09:36 AM   MICROALBUR 0.9 06/16/2018 09:35 AM    Checking BG:  Denies hypoglycemia symptoms  A1c goal < 7% Patient has tried these meds in past: metformin - CKD, declined weekly injectable with PCP 07/2019 Patient is currently uncontrolled on the following medications:   Glipizide 10 mg ER - 1 tablet daily with breakfast (refills timely)  Last diabetic eye exam:  Lab Results  Component Value Date/Time   HMDIABEYEEXA No Retinopathy 07/14/2019 12:00 AM    Last diabetic foot exam: unknown (2019 per chart)  Update 06/15/20: Patient reports he has DM supplies, plans to start back checking in the morning, will call us if he has any issues checking. CMA to review at next months monthly adherence call. Strongly recommend starting GLP-1 agonist to improve DM control.   Plan: Continue current medications; Begin checking blood glucose in the morning daily. Due for annual foot exam.   Heart Failure   Type: Systolic Last ejection fraction: 07/2017 25-30% NYHA Class: II (slight limitation of activity) AHA  HF Stage: C (Heart disease and symptoms present)  Patient has failed these meds in past: none Patient is currently controlled on the following medications:   Carvedilol 3.125 mg - 1 tablet BID  Entresto 24-26 mg - 1 tablet BID  Furosemide 20 mg - 1 tablet daily   Adherence: all refills timely We discussed: denies swelling, reports SOB with activities outside of usual routine; walks around yard to barn and back for exercise  Update 06/15/20: denies SOB/concerns  Plan: Continue current medications  Gout   Uric Acid: 8/3 05/30/20  Patient has failed these meds in past: none reported Patient is currently uncontrolled on the following medications:   Allopurinol 100 mg - 1/2 tablet daily   History: Allopurinol started after most recent elevated uric acid level 10/21/19, notable - pt has CKD   Update 06/15/20: Pt is refilling timely, but adherence questionable considering minimal change in uric acid 05/30/20. Switch to adherence packs. Pt denies any gout flares recently.    Plan: Continue current medications; Switch to adherence packs. Avoid NSAIDs and caution with dose titration of allopurinol with CKD.  Diabetic Neuropathy   Patient has failed these meds in past: none reported  Patient is currently controlled on the following medications:   Gabapentin 300 mg - 2 capsules BID  CrCl: Per current renal function --> max daily dose 1400 mg daily per FDA, 900 mg/day per Lexi-Comp Denies nerve pain with current therapy  No changes/updates 06/15/20  Plan: Continue current medications   BPH (no diagnosis in chart)   Patient has failed these meds in past: none reported Patient is currently controlled on the following medications:   Tamsulosin 0.4  mg - 1 capsule daily  Finasteride 5 mg - 1 tablet daily  At previous visit - Pt reports doing well, denies urgency, frequency; wakes up occasionally more than once a night to urinate, usually sleeps well   No updates/changes  06/15/20  Plan: Continue current medications   Vitamin D Deficiency   No recent lab values Vitam D 02/14/2010 45 Patient has failed these meds in past: none reported Patient is currently on the following medications:   No supplementation  We discussed: notable - pt has CKD   No updates 06/15/20  Plan: Consider repeat vitamin D level   Medication Management   OTCs: denies OTC use  Pharmacy: [facility]  Reviewed adherence during appointment day 06/15/20 Medications have been refilled very timely, but labs do not correlate with adherence. Pt reports he does not use a pillbox. States he takes all meds once daily in the morning except gabapentin which is twice daily. We reviewed which medications are written BID or should be taken at alternative times of day. Explained risks of not taking medications as prescribed and benefits of adherence packaging to assist with this. Pt agrees to switch from vials to adherence packs - deliver next week 06/21/20. Pt denies cost concerns, no copays for his medications.  CCM follow up: 1 month, telephone visit   Debbora Dus, PharmD Clinical Pharmacist [facility] at [facility] [phone]

## 2020-06-15 NOTE — Progress Notes (Addendum)
Chronic Care Management Pharmacy Assistant   Name: Randy [name]  MRN: [id number] DOB: [birth year]  Reason for Encounter: Medication Review/Monthly Dispensing Call  PCP : Elby Beck, FNP  Allergies:  No Known Allergies  Medications: Outpatient Encounter Medications as of 06/15/2020  Medication Sig   Accu-Chek Softclix Lancets lancets USE AS DIRECTED  TO TEST BLOOD SUGAR ONE TIME DAILY  AND AS NEEDED   Alcohol Swabs (B-D SINGLE USE SWABS REGULAR) PADS Use as instructed to clean area for glucose monitoring once daily and as needed.  Diagnosis:  E11.21  Non insulin-dependent   allopurinol (ZYLOPRIM) 100 MG tablet Take 0.5 tablets (50 mg total) by mouth daily.   aspirin EC 325 MG tablet Take 1 tablet (325 mg total) by mouth daily.   atorvastatin (LIPITOR) 80 MG tablet TAKE 1 TABLET (80 MG TOTAL) DAILY AT 6 PM.   Blood Glucose Calibration (ACCU-CHEK AVIVA) SOLN Use to calibrate blood glucose machine as recommended.  Diagnosis:  E11.21  Non-insulin dependent.   Blood Glucose Monitoring Suppl (ACCU-CHEK GUIDE) w/Device KIT Use to check blood sugar once daily.   carvedilol (COREG) 3.125 MG tablet Take 1 tablet (3.125 mg total) by mouth 2 (two) times daily.   finasteride (PROSCAR) 5 MG tablet Take 1 tablet (5 mg total) by mouth daily.   furosemide (LASIX) 20 MG tablet Take 1 tablet (20 mg total) by mouth daily.   gabapentin (NEURONTIN) 300 MG capsule Take 2 capsules (600 mg total) by mouth 2 (two) times daily.   glipiZIDE (GLUCOTROL XL) 10 MG 24 hr tablet Take 1 tablet (10 mg total) by mouth daily with breakfast.   glucose blood (ACCU-CHEK AVIVA PLUS) test strip Test Blood Sugar 1 time daily: Dx:E11.27   indomethacin (INDOCIN) 25 MG capsule Take 1 capsule (25 mg total) by mouth 3 (three) times daily as needed.   sacubitril-valsartan (ENTRESTO) 24-26 MG Take 1 tablet by mouth 2 (two) times daily.   tamsulosin (FLOMAX) 0.4 MG CAPS capsule Take 1 capsule (0.4 mg total) by mouth daily.    No facility-administered encounter medications on file as of 06/15/2020.    Current Diagnosis: Patient Active Problem List   Diagnosis Date Noted   Idiopathic gout of multiple sites 10/07/2018   Grief 06/16/2018   Dizziness 06/16/2018   Allergic rhinitis 04/24/2018   S/P CABG x 3 09/02/2017   Leukocytosis 08/27/2017   Visit for well man health check 05/07/2017   COPD (chronic obstructive pulmonary disease) ([place]) 62/56/3893   Chronic systolic CHF (congestive heart failure) ([place]) 10/18/2016   Generalized OA 06/22/2014   CKD (chronic kidney disease) stage 3, GFR 30-59 ml/min (HCC)    Synovial cyst of lumbar spine 07/17/2013    Class: Diagnosis of   Osteoarthritis of left hip 09/17/2012   Automatic implantable cardioverter-defibrillator in situ - St Jude 73/42/8768   Chronic systolic heart failure ([place]) 05/01/2011   IMPOTENCE OF ORGANIC ORIGIN 02/20/2010   Unspecified vitamin D deficiency 02/14/2010   Type 2 diabetes with nephropathy ([place]) 01/11/2010   Implantable cardioverter-defibrillator (ICD) in situ 05/10/2009   HLD (hyperlipidemia) 02/24/2008   Essential hypertension 02/24/2008   Non-ST elevation (NSTEMI) myocardial infarction ([place]) 02/24/2008   HYPERSOMNIA UNSPECIFIED 02/24/2008    Follow-Up:  Pharmacist Review   Reviewed chart for medication changes ahead of medication coordination call.  OVs, Consults, or hospital visits since last care coordination call/Pharmacist visit. 05/30/2020 PCP  Elby Beck No medication changes indicated   BP Readings from Last 3 Encounters:  05/30/20 116/62  01/06/20 (!) 94/58  11/20/19 (!) 100/58    Lab Results  Component Value Date   HGBA1C 7.9 (H) 05/30/2020     Patient obtains medications through Vials  30 Days   Last adherence delivery included: (medication name and frequency) Carvedilol 3.125 mg  - 1 tablet BID Aorvastatin 80 mg - 1 tablet daily Glipizide 10 mg ER - 1 tablet with breakfast Entresto 24-26 mg  - 1  tablet BID Furosemide 20 mg  - 1 tablet daily Allopurinol 100 mg  -  tablet daily Gabapentin 300 mg  - 2 caps BID Tamsulosin 0.4 mg - 1 tablet daily Finasteride 5 mg  - 1 tablet daily.  Patient declined (meds) last month due to PRN use/additional supply on hand. Indomethacin 25 mg- 1 capsule three times daily due to PRN use  Accu-chek test strips and lancets to check blood sugars once a day due to having an adequate supply on hand.    Patient is due for next adherence delivery on: 06/21/20  Debbora Dus to complete adherence review and delivery coordination during appt this week.  [facility] Pharmacist Assistant [phone]   I have reviewed the care management and care coordination activities outlined in this encounter and I am certifying that I agree with the content of this note. No further action required.  Debbora Dus, PharmD Clinical Pharmacist [facility] at [facility] [phone]

## 2020-06-21 ENCOUNTER — Other Ambulatory Visit: Payer: Self-pay | Admitting: Family Medicine

## 2020-06-21 ENCOUNTER — Telehealth: Payer: Self-pay

## 2020-06-21 MED ORDER — GABAPENTIN 300 MG PO CAPS: 600.0000 mg | ORAL_CAPSULE | Freq: Two times a day (BID) | ORAL | 0 refills | Status: DC

## 2020-06-21 NOTE — Telephone Encounter (Signed)
Refill sent to patients pharmacy. 

## 2020-06-21 NOTE — Telephone Encounter (Signed)
Patient is requesting refill for gabapentin to be sent to pharmacy.

## 2020-06-29 ENCOUNTER — Other Ambulatory Visit: Payer: Self-pay

## 2020-06-29 MED ORDER — EZETIMIBE 10 MG PO TABS: 10.0000 mg | ORAL_TABLET | Freq: Every day | ORAL | 3 refills | Status: DC

## 2020-06-29 MED ORDER — EZETIMIBE 10 MG PO TABS
10.0000 mg | ORAL_TABLET | Freq: Every day | ORAL | 3 refills | Status: DC
Start: 2020-06-29 — End: 2021-06-09

## 2020-06-29 NOTE — Addendum Note (Signed)
Addended by: Clarene Reamer B on: 06/29/2020 08:02 AM   Modules accepted: Orders

## 2020-07-05 ENCOUNTER — Ambulatory Visit (INDEPENDENT_AMBULATORY_CARE_PROVIDER_SITE_OTHER): Payer: Medicare (Managed Care)

## 2020-07-05 DIAGNOSIS — I255 Ischemic cardiomyopathy: Secondary | ICD-10-CM

## 2020-07-05 DIAGNOSIS — I5022 Chronic systolic (congestive) heart failure: Secondary | ICD-10-CM

## 2020-07-05 LAB — CUP PACEART REMOTE DEVICE CHECK
Battery Remaining Longevity: 62 mo
Battery Remaining Percentage: 63 %
Battery Voltage: 2.95 V
Brady Statistic AP VP Percent: 1 %
Brady Statistic AP VS Percent: 2.7 %
Brady Statistic AS VP Percent: 1 %
Brady Statistic AS VS Percent: 95 %
Brady Statistic RA Percent Paced: 1 %
Brady Statistic RV Percent Paced: 1 %
Date Time Interrogation Session: 20211207050519
HighPow Impedance: 60 Ohm
HighPow Impedance: 60 Ohm
Implantable Lead Implant Date: 20091022
Implantable Lead Implant Date: 20180322
Implantable Lead Location: 753859
Implantable Lead Location: 753860
Implantable Lead Model: 7121
Implantable Pulse Generator Implant Date: 20180322
Lead Channel Impedance Value: 1375 Ohm
Lead Channel Impedance Value: 290 Ohm
Lead Channel Pacing Threshold Amplitude: 0.5 V
Lead Channel Pacing Threshold Amplitude: 1.25 V
Lead Channel Pacing Threshold Pulse Width: 0.5 ms
Lead Channel Pacing Threshold Pulse Width: 0.8 ms
Lead Channel Sensing Intrinsic Amplitude: 1.5 mV
Lead Channel Sensing Intrinsic Amplitude: 11.7 mV
Lead Channel Setting Pacing Amplitude: 2 V
Lead Channel Setting Pacing Amplitude: 2.5 V
Lead Channel Setting Pacing Pulse Width: 0.8 ms
Lead Channel Setting Sensing Sensitivity: 0.5 mV
Pulse Gen Serial Number: 7413245

## 2020-07-11 ENCOUNTER — Ambulatory Visit (INDEPENDENT_AMBULATORY_CARE_PROVIDER_SITE_OTHER): Payer: Medicare (Managed Care)

## 2020-07-11 ENCOUNTER — Other Ambulatory Visit: Payer: Self-pay | Admitting: Family Medicine

## 2020-07-11 DIAGNOSIS — Z95810 Presence of automatic (implantable) cardiac defibrillator: Secondary | ICD-10-CM

## 2020-07-11 DIAGNOSIS — I5022 Chronic systolic (congestive) heart failure: Secondary | ICD-10-CM | POA: Diagnosis not present

## 2020-07-12 NOTE — Telephone Encounter (Signed)
Pharmacy requests refill on: Entresto 24-26 mg   LAST REFILL: 01/04/2020 (Q-180, R-1) LAST OV: 05/30/2020 NEXT OV: Not Scheduled  PHARMACY: [facility]   Pharmacy requests refill on: Furosemide 20 mg    LAST REFILL: 01/04/2020 LAST OV: 05/30/2020 NEXT OV: Not Scheduled  PHARMACY: [place], [place]

## 2020-07-13 ENCOUNTER — Telehealth: Payer: Self-pay

## 2020-07-13 NOTE — Telephone Encounter (Signed)
Remote ICM transmission received.  Attempted call to patient regarding ICM remote transmission and left detailed message per DPR.  Advised to return call for any fluid symptoms or questions. Next ICM remote transmission scheduled 08/30/2020.

## 2020-07-13 NOTE — Progress Notes (Signed)
EPIC Encounter for ICM Monitoring  Patient Name: Randy [name] is a 74 y.o. male Date: 07/13/2020 Primary Care Physican: Elby Beck, FNP Primary Cardiologist:Taylor Electrophysiologist:Taylor 10/12/2021Weight: 180lbs   AT/AF Burden: <1% (taking Aspirin)  Attempted call to patient and unable to reach.  Left detailed message per DPR regarding transmission. Transmission reviewed.   [place] thoracic impedancenormal fluid levels but was suggesting possible fluid accumulation from 11/20 - 12/8 with exception of a couple of days at baseline during that time.  Prescribed: Furosemide20 mg 1 tablet daily  Labs: 05/30/2020 Creatinine 1.63, BUN 26, Potassium 4.4, Sodium 140, GFR 41.24 08/05/2019 Creatinine 1.94, BUN 39, Potassium 5.1, Sodium 137, GFR 34.02 06/10/2019 Creatinine 2.65, BUN 42, Potassium 5.1, Sodium 139 A complete set of results can be found in Results Review.  Recommendations:Left voice mail with ICM number and encouraged to call if experiencing any fluid symptoms.  Follow-up plan: ICM clinic phone appointment on2/07/2020.91 day device clinic remote transmission scheduled for3/02/2021.  EP/Cardiology Office Visits:Recall 6/6/2022with Dr. Lovena Le.   Copy of ICM check sent to Dr.Taylor.    3 month ICM trend: 07/11/2020    1 Year ICM trend:       Rosalene Billings, RN 07/13/2020 1:05 PM

## 2020-07-14 ENCOUNTER — Telehealth: Payer: Self-pay

## 2020-07-14 NOTE — Chronic Care Management (AMB) (Addendum)
Chronic Care Management Pharmacy Assistant   Name: Randy [name]  MRN: [id number] DOB: 08/09/1945  Reason for Encounter: Medication Review  Patient Questions:  1.  Have you seen any other providers since your last visit? No  2.  Any changes in your medicines or health? Yes - started Zetia 10 mg 06/29/2020 (filled [facility] DS at [facility])   PCP : Elby Beck, FNP  Allergies:  No Known Allergies  Medications: Outpatient Encounter Medications as of 07/14/2020  Medication Sig   Accu-Chek Softclix Lancets lancets USE AS DIRECTED  TO TEST BLOOD SUGAR ONE TIME DAILY  AND AS NEEDED   Alcohol Swabs (B-D SINGLE USE SWABS REGULAR) PADS Use as instructed to clean area for glucose monitoring once daily and as needed.  Diagnosis:  E11.21  Non insulin-dependent   allopurinol (ZYLOPRIM) 100 MG tablet Take 0.5 tablets (50 mg total) by mouth daily.   aspirin EC 325 MG tablet Take 1 tablet (325 mg total) by mouth daily.   atorvastatin (LIPITOR) 80 MG tablet TAKE 1 TABLET (80 MG TOTAL) DAILY AT 6 PM.   Blood Glucose Calibration (ACCU-CHEK AVIVA) SOLN Use to calibrate blood glucose machine as recommended.  Diagnosis:  E11.21  Non-insulin dependent.   Blood Glucose Monitoring Suppl (ACCU-CHEK GUIDE) w/Device KIT Use to check blood sugar once daily.   carvedilol (COREG) 3.125 MG tablet Take 1 tablet (3.125 mg total) by mouth 2 (two) times daily.   ENTRESTO 24-26 MG TAKE ONE TABLET BY MOUTH EVERY MORNING and TAKE ONE TABLET BY MOUTH EVERY EVENING   ezetimibe (ZETIA) 10 MG tablet Take 1 tablet (10 mg total) by mouth daily.   finasteride (PROSCAR) 5 MG tablet Take 1 tablet (5 mg total) by mouth daily.   furosemide (LASIX) 20 MG tablet TAKE ONE TABLET BY MOUTH EVERY MORNING   gabapentin (NEURONTIN) 300 MG capsule Take 2 capsules (600 mg total) by mouth 2 (two) times daily.   glipiZIDE (GLUCOTROL XL) 10 MG 24 hr tablet Take 1 tablet (10 mg total) by mouth daily with breakfast.   glucose blood (ACCU-CHEK AVIVA  PLUS) test strip Test Blood Sugar 1 time daily: Dx:E11.27   indomethacin (INDOCIN) 25 MG capsule Take 1 capsule (25 mg total) by mouth 3 (three) times daily as needed.   tamsulosin (FLOMAX) 0.4 MG CAPS capsule Take 1 capsule (0.4 mg total) by mouth daily.   No facility-administered encounter medications on file as of 07/14/2020.    Current Diagnosis: Patient Active Problem List   Diagnosis Date Noted   Idiopathic gout of multiple sites 10/07/2018   Grief 06/16/2018   Dizziness 06/16/2018   Allergic rhinitis 04/24/2018   S/P CABG x 3 09/02/2017   Leukocytosis 08/27/2017   Visit for well man health check 05/07/2017   COPD (chronic obstructive pulmonary disease) ([place]) 09/73/5329   Chronic systolic CHF (congestive heart failure) ([place]) 10/18/2016   Generalized OA 06/22/2014   CKD (chronic kidney disease) stage 3, GFR 30-59 ml/min (HCC)    Synovial cyst of lumbar spine 07/17/2013    Class: Diagnosis of   Osteoarthritis of left hip 09/17/2012   Automatic implantable cardioverter-defibrillator in situ - St Jude 92/42/6834   Chronic systolic heart failure ([place]) 05/01/2011   IMPOTENCE OF ORGANIC ORIGIN 02/20/2010   Unspecified vitamin D deficiency 02/14/2010   Type 2 diabetes with nephropathy ([place]) 01/11/2010   Implantable cardioverter-defibrillator (ICD) in situ 05/10/2009   HLD (hyperlipidemia) 02/24/2008   Essential hypertension 02/24/2008   Non-ST elevation (NSTEMI) myocardial infarction (  [place]) 02/24/2008   HYPERSOMNIA UNSPECIFIED 02/24/2008   Reviewed chart for medication changes ahead of medication coordination call. PCP started Zetia 10 mg 06/29/2020 ([facility])  BP Readings from Last 3 Encounters:  05/30/20 116/62  01/06/20 (!) 94/58  11/20/19 (!) 100/58    Lab Results  Component Value Date   HGBA1C 7.9 (H) 05/30/2020    07/14/20- Left message to return call 07/18/20 - Left message to return call   Patient obtains medications through Adherence Packaging  30  Days   Last adherence delivery included:  06/24/20 30 DS, adherence packs Allopurinol 100 mg -  tablet daily (breakfast)  Atorvastatin 80 mg - 1 daily (breakfast)  Carvedilol 3.125 mg - 1 BID (breakfast and evening meal)  Entresto 24-26 mg - 1 BID (breakfast and evening meal) Finasteride 5 mg - 1 daily (breakfast)  Furosemide 20 mg - 1 daily (breakfast)  Gabapentin 300 mg - 2 BID (breakfast and evening meal)  Glipizide ER 10 mg - 1 daily with BF (breakfast)  Tamsulosin 0.28m - 1 daily (evening meal)   Patient declined the following medications last month Accu-chek test strips and lancets to check blood sugars once a day due to having an adequate supply on hand.   Indomethacin 25 mg- 1 capsule three times daily due to PRN use   Patient is due for next adherence delivery on: 07/21/20  Called patient and reviewed medications and coordinated delivery.  This delivery to include:  30 DS, adherence packs: Allopurinol 100 mg -  tablet daily (breakfast)  Atorvastatin 80 mg - 1 daily (breakfast)  Carvedilol 3.125 mg - 1 BID (breakfast and evening meal)  Entresto 24-26 mg - 1 BID (breakfast and evening meal) Finasteride 5 mg - 1 daily (breakfast)  Furosemide 20 mg - 1 daily (breakfast)  Gabapentin 300 mg - 2 BID (breakfast and evening meal)  Glipizide ER 10 mg - 1 daily with BF (breakfast)  Tamsulosin 0.450m- 1 daily (evening meal)   Patient declined the following medications: Unable to speak to patient so coordinated delivery of all meds that are due for refill. The following are PRN and will not be filled this month: Accu-chek  lancets check blood sugars once a day due to having an adequate supply on hand.   Accu-chek test strips  check blood sugars once a day due to having an adequate supply on hand.  Indomethacin 25 mg- 1 capsule three times daily due to PRN use   Patient will not need a short or acute fill of medications prior to adherence delivery.   Plan: Coordinate Zetia  into adherence packaging once he runs out around 09/27/2020 - if patient approves. Request refill on Allopurinol 100 mg - DeClarene ReamerConfirmed delivery date of 07/21/20, pharmacy will contact him the morning of delivery.  Follow-Up:  CoComptrollernd Pharmacist Review   MiDebbora DusCPP reviewed  LiMargaretmary DysCMCoalmontharmacy Assistant [phone] DusPharmD Clinical Pharmacist [phone]

## 2020-07-18 ENCOUNTER — Telehealth: Payer: Self-pay | Admitting: Internal Medicine

## 2020-07-18 ENCOUNTER — Other Ambulatory Visit: Payer: Self-pay | Admitting: Family Medicine

## 2020-07-18 ENCOUNTER — Telehealth: Payer: Self-pay

## 2020-07-18 DIAGNOSIS — E790 Hyperuricemia without signs of inflammatory arthritis and tophaceous disease: Secondary | ICD-10-CM

## 2020-07-18 MED ORDER — ALLOPURINOL 100 MG PO TABS: 50.0000 mg | ORAL_TABLET | Freq: Every day | ORAL | 1 refills | Status: DC

## 2020-07-18 NOTE — Patient Instructions (Signed)
Dear Randy [name],  Below is a summary of the goals we discussed during our follow up appointment on July 18, 2020. Please contact me anytime with questions or concerns.   Visit Information  Goals Addressed            This Visit's Progress   . Pharmacy Care Plan       CARE PLAN ENTRY  Current Barriers:  . Chronic Disease Management support, education, and care coordination needs related to Hypertension, Hyperlipidemia, Diabetes, and Gout   Hypertension . Pharmacist Clinical Goal(s): o Over the next 3 months, patient will work with PharmD and providers to maintain BP goal <130/80 mmHg . Current regimen:  o Carvedilol 3.125 mg - 1 tablet twice daily . Interventions: o Assessed control, clinic readings within goal; no dizziness or falls . Patient self care activities - Over the next 3 months, patient will: o Check home blood pressure monthly, document, and provide at future appointments o Ensure daily salt intake < 2300 mg/day  Hyperlipidemia Last lipids Lab Results  Component Value Date   CHOL 219 (H) 05/30/2020   HDL 38.50 (L) 05/30/2020   LDLCALC 145 (H) 05/30/2020   LDLDIRECT 101.0 08/05/2019   TRIG 178.0 (H) 05/30/2020   CHOLHDL 6 05/30/2020   . Pharmacist Clinical Goal(s): o Over the next 3 months, patient will work with PharmD and providers to achieve LDL goal < 70, triglycerides < 150, HDL > 40 . Current regimen:  o Atorvastatin 80 mg - 1 tablet daily . Interventions: o LDL very high despite pt confirming adherence o Recommend Zetia 10 mg daily and adherence packaging  . Patient self care activities - Over the next 3 months, patient will: . Slowly increase exercise with goal of 30 minutes, 5 days per week . Incorporate a healthy diet high in vegetables, fruits and whole grains with low-fat dairy products, chicken, fish, legumes, non-tropical vegetable oils and nuts. Limit intake of sweets, sugar-sweetened beverages and red meats.  Diabetes Recent  Relevant Labs: Lab Results  Component Value Date/Time   HGBA1C 7.9 (H) 05/30/2020 08:39 AM   HGBA1C 7.8 (H) 11/20/2019 04:27 PM   HGBA1C 6.4 06/16/2018 09:19 AM   MICROALBUR 2.0 (H) 08/05/2019 09:36 AM   MICROALBUR 0.9 06/16/2018 09:35 AM   Pharmacist Clinical Goal(s): o Over the next 3 months, patient will work with PharmD and providers to achieve A1c goal <7% . Current regimen:  o Glipizide 10 mg ER - 1 tablet daily with breakfast . Interventions: o Recommend routine blood glucose monitoring to assess diabetes control o Recommend starting a GLP-1 agonist such as Ozempic weekly to improve control. Alternatives limited due to CKD. Marland Kitchen Patient self care activities - Over the next 3 months, patient will: o Check blood sugar daily before breakfast, document, and provide at future appointment with pharmacist o Contact provider with any episodes of hypoglycemia  Gout . Pharmacist Clinical Goal(s) o Over the next 3 months, patient will work with PharmD and providers to reduce gout flares and assess uric acid  . Current regimen:  o Allopurinol 100 mg - take 1/2 tablet daily  . Interventions: o Reviewed labs - uric acid remains elevated, no flares o Avoid NSAIDs and caution with dose titration of allopurinol with CKD . Patient self care activities - Over the next 3 months, patient will: o Continue allopurinol, taking one-half tablet daily o Call if any gout flares  o Avoid over the counter anti-inflammatories (NSAIDs) due to kidney impairment (Advil, Aleve, Goody's  powder, aspirin containing products)  Please see past updates related to this goal by clicking on the "Past Updates" button in the selected goal        The patient verbalized understanding of instructions, educational materials, and care plan provided today and declined offer to receive copy of patient instructions, educational materials, and care plan.   The pharmacy team will reach out to the patient again over the next 30  days.   Debbora Dus, [facility]    Diabetes Mellitus and Standards of Medical Care Managing diabetes (diabetes mellitus) can be complicated. Your diabetes treatment may be managed by a team of health care providers, including:  A physician who specializes in diabetes (endocrinologist).  A nurse practitioner or physician assistant.  Nurses.  A diet and nutrition specialist (registered dietitian).  A certified diabetes educator (CDE).  An exercise specialist.  A pharmacist.  An eye doctor.  A foot specialist (podiatrist).  A dentist.  A primary care provider.  A mental health provider. Your health care providers follow guidelines to help you get the best quality of care. The following schedule is a general guideline for your diabetes management plan. Your health care providers may give you more specific instructions. Physical exams Upon being diagnosed with diabetes mellitus, and each year after that, your health care provider will ask about your medical and family history. He or she will also do a physical exam. Your exam may include:  Measuring your height, weight, and body mass index (BMI).  Checking your blood pressure. This will be done at every routine medical visit. Your target blood pressure may vary depending on your medical conditions, your age, and other factors.  Thyroid gland exam.  Skin exam.  Screening for damage to your nerves (peripheral neuropathy). This may include checking the pulse in your legs and feet and checking the level of sensation in your hands and feet.  A complete foot exam to inspect the structure and skin of your feet, including checking for cuts, bruises, redness, blisters, sores, or other problems.  Screening for blood vessel (vascular) problems, which may include checking the pulse in your legs and feet and checking your temperature. Blood tests Depending on your treatment plan and your personal needs, you may have the following tests  done:  HbA1c (hemoglobin A1c). This test provides information about blood sugar (glucose) control over the previous 2-3 months. It is used to adjust your treatment plan, if needed. This test will be done: ? At least 2 times a year, if you are meeting your treatment goals. ? 4 times a year, if you are not meeting your treatment goals or if treatment goals have changed.  Lipid testing, including total, LDL, and HDL cholesterol and triglyceride levels. ? The goal for LDL is less than 100 mg/dL (5.5 mmol/L). If you are at high risk for complications, the goal is less than 70 mg/dL (3.9 mmol/L). ? The goal for HDL is 40 mg/dL (2.2 mmol/L) or higher for men and 50 mg/dL (2.8 mmol/L) or higher for women. An HDL cholesterol of 60 mg/dL (3.3 mmol/L) or higher gives some protection against heart disease. ? The goal for triglycerides is less than 150 mg/dL (8.3 mmol/L).  Liver function tests.  Kidney function tests.  Thyroid function tests. Dental and eye exams  Visit your dentist two times a year.  If you have type 1 diabetes, your health care provider may recommend an eye exam 3-5 years after you are diagnosed, and then once a year  after your first exam. ? For children with type 1 diabetes, a health care provider may recommend an eye exam when your child is age 78 or older and has had diabetes for 3-5 years. After the first exam, your child should get an eye exam once a year.  If you have type 2 diabetes, your health care provider may recommend an eye exam as soon as you are diagnosed, and then once a year after your first exam. Immunizations   The yearly flu (influenza) vaccine is recommended for everyone 6 months or older who has diabetes.  The pneumonia (pneumococcal) vaccine is recommended for everyone 2 years or older who has diabetes. If you are 32 or older, you may get the pneumonia vaccine as a series of two separate shots.  The hepatitis B vaccine is recommended for adults shortly  after being diagnosed with diabetes.  Adults and children with diabetes should receive all other vaccines according to age-specific recommendations from the Centers for Disease Control and Prevention (CDC). Mental and emotional health Screening for symptoms of eating disorders, anxiety, and depression is recommended at the time of diagnosis and afterward as needed. If your screening shows that you have symptoms (positive screening result), you may need more evaluation and you may work with a mental health care provider. Treatment plan Your treatment plan will be reviewed at every medical visit. You and your health care provider will discuss:  How you are taking your medicines, including insulin.  Any side effects you are experiencing.  Your blood glucose target goals.  The frequency of your blood glucose monitoring.  Lifestyle habits, such as activity level as well as tobacco, alcohol, and substance use. Diabetes self-management education Your health care provider will assess how well you are monitoring your blood glucose levels and whether you are taking your insulin correctly. He or she may refer you to:  A certified diabetes educator to manage your diabetes throughout your life, starting at diagnosis.  A registered dietitian who can create or review your personal nutrition plan.  An exercise specialist who can discuss your activity level and exercise plan. Summary  Managing diabetes (diabetes mellitus) can be complicated. Your diabetes treatment may be managed by a team of health care providers.  Your health care providers follow guidelines in order to help you get the best quality of care.  Standards of care including having regular physical exams, blood tests, blood pressure monitoring, immunizations, screening tests, and education about how to manage your diabetes.  Your health care providers may also give you more specific instructions based on your individual health. This  information is not intended to replace advice given to you by your health care provider. Make sure you discuss any questions you have with your health care provider. Document Revised: 04/04/2018 Document Reviewed: 04/13/2016 Elsevier Patient Education  [place].

## 2020-07-18 NOTE — Telephone Encounter (Signed)
90 day supply with refill sent to [facility].

## 2020-07-18 NOTE — Telephone Encounter (Signed)
New Message  Randy [name] is calling to inform that the Pt is no longer taking Aspirin 325 mg or any other anticoagulants  She is curious if Eliquis 5 mg 2 x daily would be an option for him  Please call and speak with Randy [name] the Pharmacist

## 2020-07-18 NOTE — Telephone Encounter (Signed)
Last prescribed on 05/23/2020 #30 with 0 refill, it shows that patient is taking 1/2 tablet daily. Last seen on 05/30/2020

## 2020-07-18 NOTE — Progress Notes (Signed)
Remote ICD transmission.   

## 2020-07-18 NOTE — Telephone Encounter (Signed)
Patient needs a refill on allopurinol for his monthly adherence packaging. Please send to [facility].  Thanks!  Debbora Dus, PharmD Clinical Pharmacist [facility] at [facility] [phone]

## 2020-07-18 NOTE — Progress Notes (Addendum)
Chronic Care Management Pharmacy Assistant   Name: Randy [name]  MRN: [id number] DOB: 09/02/1945  Dr. Hillery Jacks office was contacted regarding Randy [name]. Debbora Dus, Pharm D, asked that he be notified that Randy [name] is no longer taking aspirin 325 mg, nor on any anticoagulant for atrial fibrillation. CHADSVASC score of 4, HASBLED of 3. She did not see documentation of reason for no anticoagulation and wanted to confirm Cardiology did not want to start. The office was provided Michelle's name and number for return call.   PCP : Elby Beck, FNP  Allergies:  No Known Allergies  Medications: Outpatient Encounter Medications as of 07/18/2020  Medication Sig   Accu-Chek Softclix Lancets lancets USE AS DIRECTED  TO TEST BLOOD SUGAR ONE TIME DAILY  AND AS NEEDED   Alcohol Swabs (B-D SINGLE USE SWABS REGULAR) PADS Use as instructed to clean area for glucose monitoring once daily and as needed.  Diagnosis:  E11.21  Non insulin-dependent   allopurinol (ZYLOPRIM) 100 MG tablet Take 0.5 tablets (50 mg total) by mouth daily.   aspirin EC 325 MG tablet Take 1 tablet (325 mg total) by mouth daily.   atorvastatin (LIPITOR) 80 MG tablet TAKE 1 TABLET (80 MG TOTAL) DAILY AT 6 PM.   Blood Glucose Calibration (ACCU-CHEK AVIVA) SOLN Use to calibrate blood glucose machine as recommended.  Diagnosis:  E11.21  Non-insulin dependent.   Blood Glucose Monitoring Suppl (ACCU-CHEK GUIDE) w/Device KIT Use to check blood sugar once daily.   carvedilol (COREG) 3.125 MG tablet Take 1 tablet (3.125 mg total) by mouth 2 (two) times daily.   ENTRESTO 24-26 MG TAKE ONE TABLET BY MOUTH EVERY MORNING and TAKE ONE TABLET BY MOUTH EVERY EVENING   ezetimibe (ZETIA) 10 MG tablet Take 1 tablet (10 mg total) by mouth daily.   finasteride (PROSCAR) 5 MG tablet Take 1 tablet (5 mg total) by mouth daily.   furosemide (LASIX) 20 MG tablet TAKE ONE TABLET BY MOUTH EVERY MORNING   gabapentin (NEURONTIN) 300 MG  capsule Take 2 capsules (600 mg total) by mouth 2 (two) times daily.   glipiZIDE (GLUCOTROL XL) 10 MG 24 hr tablet Take 1 tablet (10 mg total) by mouth daily with breakfast.   glucose blood (ACCU-CHEK AVIVA PLUS) test strip Test Blood Sugar 1 time daily: Dx:E11.27   indomethacin (INDOCIN) 25 MG capsule Take 1 capsule (25 mg total) by mouth 3 (three) times daily as needed.   tamsulosin (FLOMAX) 0.4 MG CAPS capsule Take 1 capsule (0.4 mg total) by mouth daily.   No facility-administered encounter medications on file as of 07/18/2020.    Current Diagnosis: Patient Active Problem List   Diagnosis Date Noted   Idiopathic gout of multiple sites 10/07/2018   Grief 06/16/2018   Dizziness 06/16/2018   Allergic rhinitis 04/24/2018   S/P CABG x 3 09/02/2017   Leukocytosis 08/27/2017   Visit for well man health check 05/07/2017   COPD (chronic obstructive pulmonary disease) ([place]) 58/59/2924   Chronic systolic CHF (congestive heart failure) ([place]) 10/18/2016   Generalized OA 06/22/2014   CKD (chronic kidney disease) stage 3, GFR 30-59 ml/min (HCC)    Synovial cyst of lumbar spine 07/17/2013    Class: Diagnosis of   Osteoarthritis of left hip 09/17/2012   Automatic implantable cardioverter-defibrillator in situ - St Jude 46/28/6381   Chronic systolic heart failure ([place]) 05/01/2011   IMPOTENCE OF ORGANIC ORIGIN 02/20/2010   Unspecified vitamin D deficiency 02/14/2010   Type 2 diabetes  with nephropathy ([place]) 01/11/2010   Implantable cardioverter-defibrillator (ICD) in situ 05/10/2009   HLD (hyperlipidemia) 02/24/2008   Essential hypertension 02/24/2008   Non-ST elevation (NSTEMI) myocardial infarction ([place]) 02/24/2008   HYPERSOMNIA UNSPECIFIED 02/24/2008     Follow-Up:  Pharmacist Review   Debbora Dus, CPP notified  Margaretmary Dys, [place] [phone]  I have reviewed the care management and care coordination activities outlined in this encounter and I am  certifying that I agree with the content of this note. No further action required.  Debbora Dus, PharmD Clinical Pharmacist [place] [facility] at [facility] [phone]

## 2020-07-19 NOTE — Chronic Care Management (AMB) (Signed)
I have personally reviewed this encounter including the documentation in this note and have collaborated with the care management provider regarding care management and care coordination activities to include development and update of the comprehensive care plan. I am certifying that I agree with the content of this note and encounter as supervising provider.      

## 2020-07-24 NOTE — Telephone Encounter (Signed)
eliquis or xarelto would be acceptable to me. GT

## 2020-07-27 NOTE — Telephone Encounter (Signed)
Call placed to pharmacist.  Will discuss further with Dr. Ladona Ridgel if he would like Pt to start [facility].

## 2020-07-27 NOTE — Telephone Encounter (Signed)
Closing encounter as provider does not have security to close it. I have made no changes to this encounter.   

## 2020-08-11 ENCOUNTER — Telehealth: Payer: Self-pay

## 2020-08-11 ENCOUNTER — Telehealth: Payer: Self-pay | Admitting: Family Medicine

## 2020-08-11 NOTE — Chronic Care Management (AMB) (Addendum)
Chronic Care Management Pharmacy Assistant   Name: Randy [name]  MRN: [id number] DOB: 04/13/1946  Reason for Encounter: Medication Adherence and Dispensing Coordination  Patient Questions:  1.  Have you seen any other providers since your last visit? No  2.  Any changes in your medicines or health? No    PCP : Elby Beck, FNP (Inactive)  Allergies:  No Known Allergies  Medications: Outpatient Encounter Medications as of 08/11/2020  Medication Sig   Accu-Chek Softclix Lancets lancets USE AS DIRECTED  TO TEST BLOOD SUGAR ONE TIME DAILY  AND AS NEEDED   Alcohol Swabs (B-D SINGLE USE SWABS REGULAR) PADS Use as instructed to clean area for glucose monitoring once daily and as needed.  Diagnosis:  E11.21  Non insulin-dependent   allopurinol (ZYLOPRIM) 100 MG tablet Take 0.5 tablets (50 mg total) by mouth daily.   atorvastatin (LIPITOR) 80 MG tablet TAKE 1 TABLET (80 MG TOTAL) DAILY AT 6 PM.   Blood Glucose Calibration (ACCU-CHEK AVIVA) SOLN Use to calibrate blood glucose machine as recommended.  Diagnosis:  E11.21  Non-insulin dependent.   Blood Glucose Monitoring Suppl (ACCU-CHEK GUIDE) w/Device KIT Use to check blood sugar once daily.   carvedilol (COREG) 3.125 MG tablet Take 1 tablet (3.125 mg total) by mouth 2 (two) times daily.   ENTRESTO 24-26 MG TAKE ONE TABLET BY MOUTH EVERY MORNING and TAKE ONE TABLET BY MOUTH EVERY EVENING   ezetimibe (ZETIA) 10 MG tablet Take 1 tablet (10 mg total) by mouth daily.   finasteride (PROSCAR) 5 MG tablet Take 1 tablet (5 mg total) by mouth daily.   furosemide (LASIX) 20 MG tablet TAKE ONE TABLET BY MOUTH EVERY MORNING   gabapentin (NEURONTIN) 300 MG capsule Take 2 capsules (600 mg total) by mouth 2 (two) times daily.   glipiZIDE (GLUCOTROL XL) 10 MG 24 hr tablet Take 1 tablet (10 mg total) by mouth daily with breakfast.   glucose blood (ACCU-CHEK AVIVA PLUS) test strip Test Blood Sugar 1 time daily: Dx:E11.27   indomethacin (INDOCIN) 25  MG capsule Take 1 capsule (25 mg total) by mouth 3 (three) times daily as needed.   tamsulosin (FLOMAX) 0.4 MG CAPS capsule Take 1 capsule (0.4 mg total) by mouth daily.   No facility-administered encounter medications on file as of 08/11/2020.    Current Diagnosis: Patient Active Problem List   Diagnosis Date Noted   Idiopathic gout of multiple sites 10/07/2018   Grief 06/16/2018   Dizziness 06/16/2018   Allergic rhinitis 04/24/2018   S/P CABG x 3 09/02/2017   Leukocytosis 08/27/2017   Visit for well man health check 05/07/2017   COPD (chronic obstructive pulmonary disease) ([place]) 64/33/2951   Chronic systolic CHF (congestive heart failure) ([place]) 10/18/2016   Generalized OA 06/22/2014   CKD (chronic kidney disease) stage 3, GFR 30-59 ml/min (HCC)    Synovial cyst of lumbar spine 07/17/2013    Class: Diagnosis of   Osteoarthritis of left hip 09/17/2012   Automatic implantable cardioverter-defibrillator in situ - St Jude 88/41/6606   Chronic systolic heart failure ([place]) 05/01/2011   IMPOTENCE OF ORGANIC ORIGIN 02/20/2010   Unspecified vitamin D deficiency 02/14/2010   Type 2 diabetes with nephropathy ([place]) 01/11/2010   Implantable cardioverter-defibrillator (ICD) in situ 05/10/2009   HLD (hyperlipidemia) 02/24/2008   Essential hypertension 02/24/2008   Non-ST elevation (NSTEMI) myocardial infarction ([place]) 02/24/2008   HYPERSOMNIA UNSPECIFIED 02/24/2008   Reviewed chart for medication changes ahead of medication coordination call.  BP Readings  from Last 3 Encounters:  05/30/20 116/62  01/06/20 (!) 94/58  11/20/19 (!) 100/58    Lab Results  Component Value Date   HGBA1C 7.9 (H) 05/30/2020    Patient obtains medications through Adherence Packaging  30 Days   Last adherence delivery included: Adherence Packs 07/20/20 30 DS: Allopurinol 100 mg -  tablet daily (breakfast)  Atorvastatin 80 mg - 1 daily (breakfast)  Carvedilol 3.125 mg - 1 BID (breakfast and evening meal)   Entresto 24-26 mg - 1 BID (breakfast and evening meal) Finasteride 5 mg - 1 daily (breakfast)  Furosemide 20 mg - 1 daily (breakfast)  Gabapentin 300 mg - 2 BID (breakfast and evening meal)  Glipizide ER 10 mg - 1 daily with BF (breakfast)  Tamsulosin 0.77m - 1 daily (evening meal)  Patient declined the following medications last month: Accu-chek  lancets check blood sugars once a day due to having an adequate supply on hand.   Accu-chek test strips  check blood sugars once a day due to having an adequate supply on hand.  Indomethacin 25 mg- 1 capsule three times daily due to PRN use   Patient is due for next adherence delivery on: 08/19/20  Called patient and reviewed medications and coordinated delivery. Patient states he has not been checking his blood pressure pressure often but notes he checked "a couple of weeks ago and it was around 120/70". Patient not checking blood glucose levels. Will follow up next week for updated BP and BG readings. New medication - Zetia 10 mg started 06/29/21. Patient agreed to allow [facility] to [facility] to include in his next adherence packaging. [facility] requested 08/11/20- patient should not need filled until September 27, 2020.   This delivery to include: Adherence Packs 08/19/20 30 DS Allopurinol 100 mg -  tablet daily (breakfast)  Atorvastatin 80 mg - 1 daily (breakfast)  Carvedilol 3.125 mg - 1 BID (breakfast and evening meal)  Entresto 24-26 mg - 1 BID (breakfast and evening meal) Finasteride 5 mg - 1 daily (breakfast)  Furosemide 20 mg - 1 daily (breakfast)  Gabapentin 300 mg - 2 BID (breakfast and evening meal)  Glipizide ER 10 mg - 1 daily with BF (breakfast)  Tamsulosin 0.44m- 1 daily (evening meal)  Patient will not need a short or acute fill prior to adherence delivery.   Patient declined the following medications:  Accu-chek  lancets check blood sugars once a day due to having an adequate supply on hand.    Accu-chek test strips  check blood sugars once a day due to having an adequate supply on hand.  Indomethacin 25 mg- 1 capsule three times daily due to PRN use  Zetia 10 mg daily-  [facility]  Patient needs refills for no medications at this time  Confirmed delivery date of 08/19/20, advised patient that pharmacy will contact them the morning of delivery. Patient requests morning delivery  Follow-Up:  Coordination of Enhanced Pharmacy Services and Pharmacist Review   MiDebbora DusCPP notified  LiMargaretmary DysCMJonesvillessistant [phone] have reviewed the care management and care coordination activities outlined in this encounter and I am certifying that I agree with the content of this note. No further action required.  MiDebbora DusPharmD Clinical Pharmacist [phone]

## 2020-08-11 NOTE — Telephone Encounter (Signed)
Will you please contact patient to schedule TOC due to [place] leaving?

## 2020-08-18 ENCOUNTER — Telehealth: Payer: Self-pay

## 2020-08-18 NOTE — Chronic Care Management (AMB) (Signed)
Chronic Care Management Pharmacy Assistant   Name: SANAD FEARNOW  MRN: [id number] DOB: Oct 11, 1945  Reason for Encounter: Disease State- Hypertension and Diabetes  Patient Questions:  1.  Have you seen any other providers since your last visit? No  2.  Any changes in your medicines or health? No  PCP : Elby Beck, FNP (Inactive)  Allergies:  No Known Allergies  Medications: Outpatient Encounter Medications as of 08/18/2020  Medication Sig  . Accu-Chek Softclix Lancets lancets USE AS DIRECTED  TO TEST BLOOD SUGAR ONE TIME DAILY  AND AS NEEDED  . Alcohol Swabs (B-D SINGLE USE SWABS REGULAR) PADS Use as instructed to clean area for glucose monitoring once daily and as needed.  Diagnosis:  E11.21  Non insulin-dependent  . allopurinol (ZYLOPRIM) 100 MG tablet Take 0.5 tablets (50 mg total) by mouth daily.  Marland Kitchen atorvastatin (LIPITOR) 80 MG tablet TAKE 1 TABLET (80 MG TOTAL) DAILY AT 6 PM.  . Blood Glucose Calibration (ACCU-CHEK AVIVA) SOLN Use to calibrate blood glucose machine as recommended.  Diagnosis:  E11.21  Non-insulin dependent.  . Blood Glucose Monitoring Suppl (ACCU-CHEK GUIDE) w/Device KIT Use to check blood sugar once daily.  . carvedilol (COREG) 3.125 MG tablet Take 1 tablet (3.125 mg total) by mouth 2 (two) times daily.  Marland Kitchen ENTRESTO 24-26 MG TAKE ONE TABLET BY MOUTH EVERY MORNING and TAKE ONE TABLET BY MOUTH EVERY EVENING  . ezetimibe (ZETIA) 10 MG tablet Take 1 tablet (10 mg total) by mouth daily.  . finasteride (PROSCAR) 5 MG tablet Take 1 tablet (5 mg total) by mouth daily.  . furosemide (LASIX) 20 MG tablet TAKE ONE TABLET BY MOUTH EVERY MORNING  . gabapentin (NEURONTIN) 300 MG capsule Take 2 capsules (600 mg total) by mouth 2 (two) times daily.  Marland Kitchen glipiZIDE (GLUCOTROL XL) 10 MG 24 hr tablet Take 1 tablet (10 mg total) by mouth daily with breakfast.  . glucose blood (ACCU-CHEK AVIVA PLUS) test strip Test Blood Sugar 1 time daily: Dx:E11.27  . indomethacin  (INDOCIN) 25 MG capsule Take 1 capsule (25 mg total) by mouth 3 (three) times daily as needed.  . tamsulosin (FLOMAX) 0.4 MG CAPS capsule Take 1 capsule (0.4 mg total) by mouth daily.   No facility-administered encounter medications on file as of 08/18/2020.    Current Diagnosis: Patient Active Problem List   Diagnosis Date Noted  . Idiopathic gout of multiple sites 10/07/2018  . Grief 06/16/2018  . Dizziness 06/16/2018  . Allergic rhinitis 04/24/2018  . S/P CABG x 3 09/02/2017  . Leukocytosis 08/27/2017  . Visit for well man health check 05/07/2017  . COPD (chronic obstructive pulmonary disease) ([place]) 05/07/2017  . Chronic systolic CHF (congestive heart failure) ([place]) 10/18/2016  . Generalized OA 06/22/2014  . CKD (chronic kidney disease) stage 3, GFR 30-59 ml/min (HCC)   . Synovial cyst of lumbar spine 07/17/2013    Class: Diagnosis of  . Osteoarthritis of left hip 09/17/2012  . Automatic implantable cardioverter-defibrillator in situ - St Jude 05/21/2012  . Chronic systolic heart failure ([place]) 05/01/2011  . IMPOTENCE OF ORGANIC ORIGIN 02/20/2010  . Unspecified vitamin D deficiency 02/14/2010  . Type 2 diabetes with nephropathy ([place]) 01/11/2010  . Implantable cardioverter-defibrillator (ICD) in situ 05/10/2009  . HLD (hyperlipidemia) 02/24/2008  . Essential hypertension 02/24/2008  . Non-ST elevation (NSTEMI) myocardial infarction ([place]) 02/24/2008  . HYPERSOMNIA UNSPECIFIED 02/24/2008    Contacted Mr. Cleckler 08/23/20 to follow up on blood glucose and blood sugar readings  he did not have any readings available at this time. Agreed on follow up call 08/29/20. Patient did not answer when called. Left message to return call.   Follow-Up:  Pharmacist Review   Debbora Dus, CPP notified   Margaretmary Dys, [place] Pharmacy Assistant [phone]

## 2020-08-19 ENCOUNTER — Telehealth: Payer: Self-pay | Admitting: Emergency Medicine

## 2020-08-19 NOTE — Telephone Encounter (Signed)
This nurse is working on afib portion of this message.  Await advisement on VT portion of message from GT.

## 2020-08-19 NOTE — Telephone Encounter (Signed)
Left message requesting  call back.

## 2020-08-19 NOTE — Telephone Encounter (Signed)
Received transmission that showed episode of VT that fell in th VT2 zone on 07/31/20 at 1246. ATP x 1 unsuccessful , episode terminated spontaneously and shock aborted. Patient reports that he was asymptomatic and unaware of episode.Patient also had 15 AMS episodes  With EGMs that appear to be AF/AFL episodes with longest lasting 4 hours and 38 minutes, no OAC. No missed doses of Coreg 3.125 mg BID or Entresto 24-26 mg .

## 2020-08-24 NOTE — Telephone Encounter (Signed)
Left message requesting  call back.

## 2020-08-25 ENCOUNTER — Telehealth: Payer: Self-pay

## 2020-08-25 NOTE — Telephone Encounter (Signed)
Raquel Sarna, could someone contact Shawna Clamp to see if he would like to establish with a new provider? He is due for refills on medications.  Thanks,  Debbora Dus, PharmD Clinical Pharmacist [place] [facility] at [facility] [phone]

## 2020-08-29 NOTE — Telephone Encounter (Signed)
No change. I do not need to be notified for NSVT, even long episodes. GT

## 2020-08-29 NOTE — Telephone Encounter (Signed)
Spoke with patient said to call him back tomorrow to schedule TOC appt.

## 2020-08-30 ENCOUNTER — Ambulatory Visit (INDEPENDENT_AMBULATORY_CARE_PROVIDER_SITE_OTHER): Payer: Medicare Other

## 2020-08-30 DIAGNOSIS — I5022 Chronic systolic (congestive) heart failure: Secondary | ICD-10-CM

## 2020-08-30 DIAGNOSIS — Z95810 Presence of automatic (implantable) cardiac defibrillator: Secondary | ICD-10-CM | POA: Diagnosis not present

## 2020-09-01 MED ORDER — APIXABAN 5 MG PO TABS: 5.0000 mg | ORAL_TABLET | Freq: Two times a day (BID) | ORAL | 11 refills | Status: DC

## 2020-09-01 NOTE — Telephone Encounter (Signed)
Call placed to Pt.  Per Pt he does not remember having been on a blood thinner.    Will start Eliquis 5 mg PO BID.  Will leave samples and 30 day free card for pick up.  Pt indicates understanding.

## 2020-09-02 ENCOUNTER — Telehealth: Payer: Self-pay

## 2020-09-02 NOTE — Telephone Encounter (Signed)
Remote ICM transmission received.  Attempted call to patient regarding ICM remote transmission and left detailed message per DPR.  Advised to return call for any fluid symptoms or questions.  

## 2020-09-02 NOTE — Progress Notes (Signed)
EPIC Encounter for ICM Monitoring  Patient Name: Randy [name] is a 75 y.o. male Date: 09/02/2020 Primary Care Physican: Elby Beck, FNP (Inactive) Primary Cardiologist:Taylor Electrophysiologist:Taylor 10/12/2021Weight: 180lbs   AT/AF Burden: <1% (taking Aspirin)  Attempted call to patient and unable to reach.  Left detailed message per DPR regarding transmission. Transmission reviewed.   Corvue thoracic impedancesuggesting possible fluid accumulation starting 08/26/2020 and trending back to baseline.  Prescribed: Furosemide20 mg 1 tablet daily  Labs: 05/30/2020 Creatinine 1.63, BUN 26, Potassium 4.4, Sodium 140, GFR 41.24 08/05/2019 Creatinine 1.94, BUN 39, Potassium 5.1, Sodium 137, GFR 34.02 06/10/2019 Creatinine 2.65, BUN 42, Potassium 5.1, Sodium 139 A complete set of results can be found in Results Review.  Recommendations:Left voice mail with ICM number and encouraged to call if experiencing any fluid symptoms.  Follow-up plan: ICM clinic phone appointment on3/03/2021.91 day device clinic remote transmission scheduled for3/02/2021.  EP/Cardiology Office Visits:Recall 6/6/2022with Dr. Lovena Le.   Copy of ICM check sent to Dr.Taylor.   3 month ICM trend: 08/31/2020.    1 Year ICM trend:       Rosalene Billings, RN 09/02/2020 11:58 AM

## 2020-09-05 ENCOUNTER — Other Ambulatory Visit: Payer: Self-pay | Admitting: *Deleted

## 2020-09-05 MED ORDER — GABAPENTIN 300 MG PO CAPS: 600.0000 mg | ORAL_CAPSULE | Freq: Two times a day (BID) | ORAL | 0 refills | Status: DC

## 2020-09-05 MED ORDER — CARVEDILOL 3.125 MG PO TABS
3.1250 mg | ORAL_TABLET | Freq: Two times a day (BID) | ORAL | 0 refills | Status: DC
Start: 2020-09-05 — End: 2020-12-09

## 2020-09-05 NOTE — Telephone Encounter (Signed)
-----   Message from Debbora Dus, [facility] sent at 09/04/2020  8:42 PM EST ----- Regarding: Randy [name],  This patient is transitioning from [facility] to Dr. Diona Browner. Would you mind sending refills for his carvedilol and gabapentin to [facility]? He has an appt scheduled with Dr. Diona Browner in March.  Thanks,  Debbora Dus, PharmD Clinical Pharmacist [place] [facility] at [facility] [phone]

## 2020-09-12 ENCOUNTER — Telehealth: Payer: Self-pay

## 2020-09-12 NOTE — Chronic Care Management (AMB) (Addendum)
Chronic Care Management Pharmacy Assistant   Name: Randy [name]  MRN: [id number] DOB: [birth year]  Reason for Encounter: Medication Review- Medication Adherence and Delivery Coordination  PCP : Elby Beck, FNP (Inactive) - TOC to [facility] March 2022  Allergies:  No Known Allergies  Medications: Outpatient Encounter Medications as of 09/12/2020  Medication Sig   Accu-Chek Softclix Lancets lancets USE AS DIRECTED  TO TEST BLOOD SUGAR ONE TIME DAILY  AND AS NEEDED   Alcohol Swabs (B-D SINGLE USE SWABS REGULAR) PADS Use as instructed to clean area for glucose monitoring once daily and as needed.  Diagnosis:  E11.21  Non insulin-dependent   allopurinol (ZYLOPRIM) 100 MG tablet Take 0.5 tablets (50 mg total) by mouth daily.   apixaban (ELIQUIS) 5 MG TABS tablet Take 1 tablet (5 mg total) by mouth 2 (two) times daily.   atorvastatin (LIPITOR) 80 MG tablet TAKE 1 TABLET (80 MG TOTAL) DAILY AT 6 PM.   Blood Glucose Calibration (ACCU-CHEK AVIVA) SOLN Use to calibrate blood glucose machine as recommended.  Diagnosis:  E11.21  Non-insulin dependent.   Blood Glucose Monitoring Suppl (ACCU-CHEK GUIDE) w/Device KIT Use to check blood sugar once daily.   carvedilol (COREG) 3.125 MG tablet Take 1 tablet (3.125 mg total) by mouth 2 (two) times daily.   ENTRESTO 24-26 MG TAKE ONE TABLET BY MOUTH EVERY MORNING and TAKE ONE TABLET BY MOUTH EVERY EVENING   ezetimibe (ZETIA) 10 MG tablet Take 1 tablet (10 mg total) by mouth daily.   finasteride (PROSCAR) 5 MG tablet Take 1 tablet (5 mg total) by mouth daily.   furosemide (LASIX) 20 MG tablet TAKE ONE TABLET BY MOUTH EVERY MORNING   gabapentin (NEURONTIN) 300 MG capsule Take 2 capsules (600 mg total) by mouth 2 (two) times daily.   glipiZIDE (GLUCOTROL XL) 10 MG 24 hr tablet Take 1 tablet (10 mg total) by mouth daily with breakfast.   glucose blood (ACCU-CHEK AVIVA PLUS) test strip Test Blood Sugar 1 time daily: Dx:E11.27   indomethacin (INDOCIN)  25 MG capsule Take 1 capsule (25 mg total) by mouth 3 (three) times daily as needed.   tamsulosin (FLOMAX) 0.4 MG CAPS capsule Take 1 capsule (0.4 mg total) by mouth daily.   No facility-administered encounter medications on file as of 09/12/2020.    Current Diagnosis: Patient Active Problem List   Diagnosis Date Noted   Idiopathic gout of multiple sites 10/07/2018   Grief 06/16/2018   Dizziness 06/16/2018   Allergic rhinitis 04/24/2018   S/P CABG x 3 09/02/2017   Leukocytosis 08/27/2017   Visit for well man health check 05/07/2017   COPD (chronic obstructive pulmonary disease) ([place]) 35/57/3220   Chronic systolic CHF (congestive heart failure) ([place]) 10/18/2016   Generalized OA 06/22/2014   CKD (chronic kidney disease) stage 3, GFR 30-59 ml/min (HCC)    Synovial cyst of lumbar spine 07/17/2013    Class: Diagnosis of   Osteoarthritis of left hip 09/17/2012   Automatic implantable cardioverter-defibrillator in situ - St Jude 25/42/7062   Chronic systolic heart failure ([place]) 05/01/2011   IMPOTENCE OF ORGANIC ORIGIN 02/20/2010   Unspecified vitamin D deficiency 02/14/2010   Type 2 diabetes with nephropathy ([place]) 01/11/2010   Implantable cardioverter-defibrillator (ICD) in situ 05/10/2009   HLD (hyperlipidemia) 02/24/2008   Essential hypertension 02/24/2008   Non-ST elevation (NSTEMI) myocardial infarction ([place]) 02/24/2008   HYPERSOMNIA UNSPECIFIED 02/24/2008    Reviewed chart for medication changes ahead of medication coordination call.  No OVs, Consults,  or hospital visits since last care coordination call / Pharmacist visit. No medication changes indicated  BP Readings from Last 3 Encounters:  05/30/20 116/62  01/06/20 (!) 94/58  11/20/19 (!) 100/58    Lab Results  Component Value Date   HGBA1C 7.9 (H) 05/30/2020    Unable to reach patient after 3 attempts. Coordinated delivery based on refill history.  Patient obtains medications through Adherence Packaging  30 Days    Last adherence delivery date: 08/19/20   Allopurinol 100 mg -  tablet daily (breakfast)  Atorvastatin 80 mg - 1 daily (breakfast)  Carvedilol 3.125 mg - 1 BID (breakfast and evening meal)  Entresto 24-26 mg - 1 BID (breakfast and evening meal) Finasteride 5 mg - 1 daily (breakfast)  Furosemide 20 mg - 1 daily (breakfast)  Gabapentin 300 mg - 2 BID (breakfast and evening meal)  Glipizide ER 10 mg - 1 daily with BF (breakfast)  Tamsulosin 0.44m - 1 daily (evening meal)  Patient declined the following medications last delivery: Accu-chek  lancets check blood sugars once a day due to having an adequate supply on hand.   Accu-chek test strips  check blood sugars once a day due to having an adequate supply on hand.  Indomethacin 25 mg- 1 capsule three times daily due to PRN use Zetia 10 mg daily- [facility] 90 DS (NEW)  Patient is due for next adherence delivery on: 09/20/20  This delivery to include: Adherence Packaging  30 Days  Allopurinol 100 mg -  tablet daily (breakfast)  Atorvastatin 80 mg - 1 daily (breakfast)  Carvedilol 3.125 mg - 1 BID (breakfast and evening meal)  Entresto 24-26 mg - 1 BID (breakfast and evening meal) Finasteride 5 mg - 1 daily (breakfast)  Furosemide 20 mg - 1 daily (breakfast)  Gabapentin 300 mg - 2 BID (breakfast and evening meal)  Glipizide ER 10 mg - 1 daily with BF (breakfast)  Tamsulosin 0.465m- 1 daily (evening meal) Zetia 10 mg- 1 tablet by mouth (breakfast) PRN/VIAL medications: NONE  Not due this month: Accu-chek  lancets check blood sugars once a day due to having an adequate supply on hand.   Accu-chek test strips  check blood sugars once a day due to having an adequate supply on hand.  Indomethacin 25 mg- 1 capsule three times daily due to PRN use  Patient does not need refills on any medications  Delivery date of 09/20/20, advised patient via voicemail that pharmacy will contact him the morning of delivery.  Multiple  attempts made to contact patient. Unsuccessful outreach. Will proceed with delivery on 2/22 as medications are due.  Follow-Up:  Coordination of Enhanced Pharmacy Services and Pharmacist Review  MiDebbora DusCPP notified  LiMargaretmary DysCMMorrison Bluffharmacy Assistant [phone] have reviewed the care management and care coordination activities outlined in this encounter and I am certifying that I agree with the content of this note. No further action required.  MiDebbora DusPharmD Clinical Pharmacist [phone]

## 2020-09-27 ENCOUNTER — Other Ambulatory Visit: Payer: Medicare Other

## 2020-10-04 ENCOUNTER — Ambulatory Visit (INDEPENDENT_AMBULATORY_CARE_PROVIDER_SITE_OTHER): Payer: Medicare Other

## 2020-10-04 DIAGNOSIS — I255 Ischemic cardiomyopathy: Secondary | ICD-10-CM

## 2020-10-04 DIAGNOSIS — I5022 Chronic systolic (congestive) heart failure: Secondary | ICD-10-CM

## 2020-10-04 LAB — CUP PACEART REMOTE DEVICE CHECK
Battery Remaining Longevity: 58 mo
Battery Remaining Percentage: 60 %
Battery Voltage: 2.93 V
Brady Statistic AP VP Percent: 1 %
Brady Statistic AP VS Percent: 3 %
Brady Statistic AS VP Percent: 1 %
Brady Statistic AS VS Percent: 95 %
Brady Statistic RA Percent Paced: 1 %
Brady Statistic RV Percent Paced: 1 %
Date Time Interrogation Session: 20220308020019
HighPow Impedance: 65 Ohm
HighPow Impedance: 65 Ohm
Implantable Lead Implant Date: 20091022
Implantable Lead Implant Date: 20180322
Implantable Lead Location: 753859
Implantable Lead Location: 753860
Implantable Lead Model: 7121
Implantable Pulse Generator Implant Date: 20180322
Lead Channel Impedance Value: 1450 Ohm
Lead Channel Impedance Value: 290 Ohm
Lead Channel Pacing Threshold Amplitude: 0.5 V
Lead Channel Pacing Threshold Amplitude: 1.25 V
Lead Channel Pacing Threshold Pulse Width: 0.5 ms
Lead Channel Pacing Threshold Pulse Width: 0.8 ms
Lead Channel Sensing Intrinsic Amplitude: 1.6 mV
Lead Channel Sensing Intrinsic Amplitude: 12 mV
Lead Channel Setting Pacing Amplitude: 2 V
Lead Channel Setting Pacing Amplitude: 2.5 V
Lead Channel Setting Pacing Pulse Width: 0.8 ms
Lead Channel Setting Sensing Sensitivity: 0.5 mV
Pulse Gen Serial Number: 7413245

## 2020-10-05 ENCOUNTER — Telehealth: Payer: Self-pay

## 2020-10-05 ENCOUNTER — Ambulatory Visit (INDEPENDENT_AMBULATORY_CARE_PROVIDER_SITE_OTHER): Payer: Medicare Other

## 2020-10-05 DIAGNOSIS — I5022 Chronic systolic (congestive) heart failure: Secondary | ICD-10-CM | POA: Diagnosis not present

## 2020-10-05 DIAGNOSIS — Z95810 Presence of automatic (implantable) cardiac defibrillator: Secondary | ICD-10-CM

## 2020-10-05 NOTE — Progress Notes (Signed)
EPIC Encounter for ICM Monitoring  Patient Name: Randy [name] is a 75 y.o. male Date: 10/05/2020 Primary Care Physican: Elby Beck, FNP (Inactive) Primary Cardiologist:Taylor Electrophysiologist:Taylor 10/12/2021Weight: 180lbs   AT/AF Burden: 1.1% (taking Aspirin)  Attempted call to patient and unable to reach.  Left detailed message per DPR regarding transmission. Transmission reviewed.   Corvue thoracic impedancesuggesting possible fluid accumulation starting 10/02/2020.  Impedance also suggesting fluid accumulation from 3/1-3/5.  Prescribed: Furosemide20 mg 1 tablet daily  Labs: 05/30/2020 Creatinine 1.63, BUN 26, Potassium 4.4, Sodium 140, GFR 41.24 08/05/2019 Creatinine 1.94, BUN 39, Potassium 5.1, Sodium 137, GFR 34.02 06/10/2019 Creatinine 2.65, BUN 42, Potassium 5.1, Sodium 139 A complete set of results can be found in Results Review.  Recommendations:Left voice mail with ICM number and encouraged to call if experiencing any fluid symptoms.  Follow-up plan: ICM clinic phone appointment on3/18/2022 to recheck fluid levels.91 day device clinic remote transmission scheduled for3/02/2021.  EP/Cardiology Office Visits:Recall 6/6/2022with Dr. Lovena Le.   Copy of ICM check sent to Dr.Taylor.  3 month ICM trend: 10/04/2020.    1 Year ICM trend:       Rosalene Billings, RN 10/05/2020 8:32 AM

## 2020-10-05 NOTE — Telephone Encounter (Signed)
Remote ICM transmission received.  Attempted call to patient regarding ICM remote transmission and left detailed message per DPR.  Advised to return call for any fluid symptoms or questions.  

## 2020-10-12 ENCOUNTER — Telehealth: Payer: Self-pay

## 2020-10-12 NOTE — Progress Notes (Signed)
Remote ICD transmission.   

## 2020-10-12 NOTE — Chronic Care Management (AMB) (Addendum)
Chronic Care Management Pharmacy Assistant   Name: RAYSHAD RIVIELLO  MRN: [id number] DOB: 07/15/1946  Reason for Encounter: Medication Adherence and Delivery Coordination  Recent office visits:  No office visits since last CCM contact  Recent consult visits:  No office visits since last CCM contact   Hospital visits:  None in previous 6 months  Medications: Outpatient Encounter Medications as of 10/12/2020  Medication Sig   Accu-Chek Softclix Lancets lancets USE AS DIRECTED  TO TEST BLOOD SUGAR ONE TIME DAILY  AND AS NEEDED   Alcohol Swabs (B-D SINGLE USE SWABS REGULAR) PADS Use as instructed to clean area for glucose monitoring once daily and as needed.  Diagnosis:  E11.21  Non insulin-dependent   allopurinol (ZYLOPRIM) 100 MG tablet Take 0.5 tablets (50 mg total) by mouth daily.   apixaban (ELIQUIS) 5 MG TABS tablet Take 1 tablet (5 mg total) by mouth 2 (two) times daily.   atorvastatin (LIPITOR) 80 MG tablet TAKE 1 TABLET (80 MG TOTAL) DAILY AT 6 PM.   Blood Glucose Calibration (ACCU-CHEK AVIVA) SOLN Use to calibrate blood glucose machine as recommended.  Diagnosis:  E11.21  Non-insulin dependent.   Blood Glucose Monitoring Suppl (ACCU-CHEK GUIDE) w/Device KIT Use to check blood sugar once daily.   carvedilol (COREG) 3.125 MG tablet Take 1 tablet (3.125 mg total) by mouth 2 (two) times daily.   ENTRESTO 24-26 MG TAKE ONE TABLET BY MOUTH EVERY MORNING and TAKE ONE TABLET BY MOUTH EVERY EVENING   ezetimibe (ZETIA) 10 MG tablet Take 1 tablet (10 mg total) by mouth daily.   finasteride (PROSCAR) 5 MG tablet Take 1 tablet (5 mg total) by mouth daily.   furosemide (LASIX) 20 MG tablet TAKE ONE TABLET BY MOUTH EVERY MORNING   gabapentin (NEURONTIN) 300 MG capsule Take 2 capsules (600 mg total) by mouth 2 (two) times daily.   glipiZIDE (GLUCOTROL XL) 10 MG 24 hr tablet Take 1 tablet (10 mg total) by mouth daily with breakfast.   glucose blood (ACCU-CHEK AVIVA PLUS) test strip Test  Blood Sugar 1 time daily: Dx:E11.27   indomethacin (INDOCIN) 25 MG capsule Take 1 capsule (25 mg total) by mouth 3 (three) times daily as needed.   tamsulosin (FLOMAX) 0.4 MG CAPS capsule Take 1 capsule (0.4 mg total) by mouth daily.   No facility-administered encounter medications on file as of 10/12/2020.   Reviewed chart for medication changes ahead of medication coordination call.  No OVs, Consults, or hospital visits since last care coordination call/Pharmacist visit.   No medication changes indicated   BP Readings from Last 3 Encounters:  05/30/20 116/62  01/06/20 (!) 94/58  11/20/19 (!) 100/58    Lab Results  Component Value Date   HGBA1C 7.9 (H) 05/30/2020     Patient obtains medications through Adherence Packaging  30 Days   Last adherence delivery included on 09/20/20 Allopurinol 100 mg -  tablet daily (breakfast)  Atorvastatin 80 mg - 1 daily (breakfast)  Carvedilol 3.125 mg - 1 BID (breakfast and evening meal)  Entresto 24-26 mg - 1 BID (breakfast and evening meal) Finasteride 5 mg - 1 daily (breakfast)  Furosemide 20 mg - 1 daily (breakfast)  Gabapentin 300 mg - 2 BID (breakfast and evening meal)  Glipizide ER 10 mg - 1 daily with BF (breakfast)  Tamsulosin 0.53m - 1 daily (evening meal) Zetia 10 mg- 1 tablet daily (breakfast) - NEW  Patient declined the following medications last month:  Accu-chek  lancets check blood  sugars once a day due to having an adequate supply on hand.   Accu-chek test strips  check blood sugars once a day due to having an adequate supply on hand.  Indomethacin 25 mg- 1 capsule three times daily due to PRN use  Patient is due for next adherence delivery on 10/20/20 . Called patient and reviewed medications and coordinated delivery.  This delivery to include: Allopurinol 100 mg -  tablet daily (breakfast)  Atorvastatin 80 mg - 1 daily (breakfast)  Carvedilol 3.125 mg - 1 BID (breakfast and evening meal)  Entresto 24-26 mg - 1 BID  (breakfast and evening meal) Finasteride 5 mg - 1 daily (breakfast)  Furosemide 20 mg - 1 daily (breakfast)  Gabapentin 300 mg - 2 BID (breakfast and evening meal)  Glipizide ER 10 mg - 1 daily with BF (breakfast)  Tamsulosin 0.16m - 1 daily (evening meal) Zetia 10 mg- 1 tablet daily (breakfast)  VIALS/PRN Accu-chek  lancets check blood sugars once a day Accu-chek test strips  check blood sugars once a day   Patient declined the following medications: No medications denied  Patient needs refills on indomethacin 25 mg.  Confirmed delivery date of 10/20/20, advised patient that pharmacy will contact them the morning of delivery.  Patient states he has not been keeping up with blood pressure or blood sugar.   Star Rating Drugs: Atorvastatin 866m last fill 09/20/20  30 DS Entresto 24-26 mg - last fill 09/20/20  30 DS Glipizide 10 mg - last fill 09/20/20  30 DS  Follow-Up:  Coordination of Enhanced Pharmacy Services and Pharmacist Review  MiDebbora DusCPP notified  LiMargaretmary [phone] requested refill on indomethacin. No refills on pharmacy profile. Reviewed chart - kidney function abnormal, do not recommend routine indomethacin use. He is due for eval with PCP. Will let him know he needs to discuss with new PCP on 10/18/20. No further action required.  MiDebbora DusPharmD Clinical Pharmacist [facility] at [phone] Time for month: 28 CPP Time for month: 32

## 2020-10-14 ENCOUNTER — Ambulatory Visit (INDEPENDENT_AMBULATORY_CARE_PROVIDER_SITE_OTHER): Payer: Medicare Other

## 2020-10-14 DIAGNOSIS — Z95810 Presence of automatic (implantable) cardiac defibrillator: Secondary | ICD-10-CM | POA: Diagnosis not present

## 2020-10-14 DIAGNOSIS — I5022 Chronic systolic (congestive) heart failure: Secondary | ICD-10-CM

## 2020-10-14 NOTE — Progress Notes (Signed)
EPIC Encounter for ICM Monitoring  Patient Name: Randy [name] is a 75 y.o. male Date: 10/14/2020 Primary Care Physican: Elby Beck, FNP (Inactive) Primary Cardiologist:Taylor Electrophysiologist:Taylor 10/12/2021Weight: 180lbs   AT/AF Burden: 1.1% (taking Aspirin)  Transmission reviewed.   Corvue thoracic impedancesuggesting normal fluid levels.  Prescribed: Furosemide20 mg 1 tablet daily  Labs: 05/30/2020 Creatinine 1.63, BUN 26, Potassium 4.4, Sodium 140, GFR 41.24 08/05/2019 Creatinine 1.94, BUN 39, Potassium 5.1, Sodium 137, GFR 34.02 06/10/2019 Creatinine 2.65, BUN 42, Potassium 5.1, Sodium 139 A complete set of results can be found in Results Review.  Recommendations:  No changes.   Follow-up plan: ICM clinic phone appointment on4/05/2021.91 day device clinic remote transmission scheduled for6/01/2021.  EP/Cardiology Office Visits:Recall 6/6/2022with Dr. Lovena Le.   Copy of ICM check sent to Dr.Taylor.  3 month ICM trend: 10/08/2020.    1 Year ICM trend:       Rosalene Billings, RN 10/14/2020 3:24 PM

## 2020-10-18 ENCOUNTER — Encounter: Payer: Medicare Other | Admitting: Family Medicine

## 2020-10-18 DIAGNOSIS — Z0289 Encounter for other administrative examinations: Secondary | ICD-10-CM

## 2020-11-07 ENCOUNTER — Ambulatory Visit (INDEPENDENT_AMBULATORY_CARE_PROVIDER_SITE_OTHER): Payer: Medicare HMO

## 2020-11-07 DIAGNOSIS — I5022 Chronic systolic (congestive) heart failure: Secondary | ICD-10-CM

## 2020-11-07 DIAGNOSIS — Z95810 Presence of automatic (implantable) cardiac defibrillator: Secondary | ICD-10-CM

## 2020-11-08 ENCOUNTER — Telehealth: Payer: Self-pay

## 2020-11-08 NOTE — Progress Notes (Signed)
EPIC Encounter for ICM Monitoring  Patient Name: Randy [name] is a 75 y.o. male Date: 11/08/2020 Primary Care Physican: Elby Beck, FNP (Inactive) Primary Cardiologist:Taylor Electrophysiologist:Taylor 10/12/2021Weight: 180lbs   AT/AF Burden: <1% (taking Aspirin)  Attempted call to patient and unable to reach.  Left detailed message per DPR regarding transmission. Transmission reviewed.   Corvue thoracic impedancesuggesting normal fluid levels.  Prescribed: Furosemide20 mg 1 tablet daily  Labs: 05/30/2020 Creatinine 1.63, BUN 26, Potassium 4.4, Sodium 140, GFR 41.24 08/05/2019 Creatinine 1.94, BUN 39, Potassium 5.1, Sodium 137, GFR 34.02 06/10/2019 Creatinine 2.65, BUN 42, Potassium 5.1, Sodium 139 A complete set of results can be found in Results Review.  Recommendations: Left voice mail with ICM number and encouraged to call if experiencing any fluid symptoms.  Follow-up plan: ICM clinic phone appointment on 01/04/2021.91 day device clinic remote transmission scheduled for6/01/2021.  EP/Cardiology Office Visits:Recall 6/6/2022with Dr. Lovena Le.   Copy of ICM check sent to Dr.Taylor.  3 month ICM trend: 11/08/2020.    1 Year ICM trend:       Rosalene Billings, RN 11/08/2020 8:30 AM

## 2020-11-08 NOTE — Telephone Encounter (Signed)
Remote ICM transmission received.  Attempted call to patient regarding ICM remote transmission and left detailed message per DPR.  Advised to return call for any fluid symptoms or questions. Next ICM remote transmission scheduled 01/04/2021.

## 2020-11-11 ENCOUNTER — Telehealth: Payer: Self-pay

## 2020-11-11 NOTE — Chronic Care Management (AMB) (Addendum)
Chronic Care Management Pharmacy Assistant   Name: Randy [name]  MRN: [id number] DOB: 1945-08-29   Reason for Encounter: Medication Review medication adherence and delivery coordination  Recent office visits:  None since last CCM contact  Recent consult visits:  None since last CCM contact  Hospital visits:  None in previous 6 months  Medications: Outpatient Encounter Medications as of 11/11/2020  Medication Sig   Accu-Chek Softclix Lancets lancets USE AS DIRECTED  TO TEST BLOOD SUGAR ONE TIME DAILY  AND AS NEEDED   Alcohol Swabs (B-D SINGLE USE SWABS REGULAR) PADS Use as instructed to clean area for glucose monitoring once daily and as needed.  Diagnosis:  E11.21  Non insulin-dependent   allopurinol (ZYLOPRIM) 100 MG tablet Take 0.5 tablets (50 mg total) by mouth daily.   apixaban (ELIQUIS) 5 MG TABS tablet Take 1 tablet (5 mg total) by mouth 2 (two) times daily.   atorvastatin (LIPITOR) 80 MG tablet TAKE 1 TABLET (80 MG TOTAL) DAILY AT 6 PM.   Blood Glucose Calibration (ACCU-CHEK AVIVA) SOLN Use to calibrate blood glucose machine as recommended.  Diagnosis:  E11.21  Non-insulin dependent.   Blood Glucose Monitoring Suppl (ACCU-CHEK GUIDE) w/Device KIT Use to check blood sugar once daily.   carvedilol (COREG) 3.125 MG tablet Take 1 tablet (3.125 mg total) by mouth 2 (two) times daily.   ENTRESTO 24-26 MG TAKE ONE TABLET BY MOUTH EVERY MORNING and TAKE ONE TABLET BY MOUTH EVERY EVENING   ezetimibe (ZETIA) 10 MG tablet Take 1 tablet (10 mg total) by mouth daily.   finasteride (PROSCAR) 5 MG tablet Take 1 tablet (5 mg total) by mouth daily.   furosemide (LASIX) 20 MG tablet TAKE ONE TABLET BY MOUTH EVERY MORNING   gabapentin (NEURONTIN) 300 MG capsule Take 2 capsules (600 mg total) by mouth 2 (two) times daily.   glipiZIDE (GLUCOTROL XL) 10 MG 24 hr tablet Take 1 tablet (10 mg total) by mouth daily with breakfast.   glucose blood (ACCU-CHEK AVIVA PLUS) test strip Test Blood  Sugar 1 time daily: Dx:E11.27   indomethacin (INDOCIN) 25 MG capsule Take 1 capsule (25 mg total) by mouth 3 (three) times daily as needed.   tamsulosin (FLOMAX) 0.4 MG CAPS capsule Take 1 capsule (0.4 mg total) by mouth daily.   No facility-administered encounter medications on file as of 11/11/2020.    BP Readings from Last 3 Encounters:  05/30/20 116/62  01/06/20 (!) 94/58  11/20/19 (!) 100/58    Lab Results  Component Value Date   HGBA1C 7.9 (H) 05/30/2020    Left msg to return call. 4/15,4/18, 11/16/2020 unsuccessful outreach.  No OVs, Consults, or hospital visits since last care coordination call / Pharmacist visit.   No medication changes indicated  Patient obtains medications through Adherence Packaging  30 Days   Last adherence delivery date: 10/20/2020  Patient is due for next adherence delivery on: 11/18/2020  This delivery to include: Adherence Packaging  30 Days  Packs: Allopurinol 100 mg -  tablet daily (breakfast)  Atorvastatin 80 mg - 1 daily (breakfast)  Carvedilol 3.125 mg - 1 BID (breakfast and evening meal)  Entresto 24-26 mg - 1 BID (breakfast and evening meal) Finasteride 5 mg - 1 daily (breakfast)  Furosemide 20 mg - 1 daily (breakfast)  Gabapentin 300 mg - 2 BID (breakfast and evening meal)  Glipizide ER 10 mg - 1 daily with BF (breakfast)  Tamsulosin 0.4m - 1 daily (evening meal) Zetia 10 mg- 1  tablet daily (breakfast)   Not due for the following medications this month: Accu-chek  lancets check blood sugars once a day  Accu-chek test strips  check blood sugars once a day  Indomethacin 25 mg- 1 capsule three times daily (  Confirmed delivery date of 11/18/2020. The pharmacy will contact him the morning of delivery.    Star Rating Drugs:  Medication:  Last Fill: Day Supply Atorvastatin 61m. 09/17/20 30ds Glipizide 165m 09/17/20 30ds Entresto 2420m6mg.  09/17/20 30ds  Follow-Up:  Coordination of Enhanced Pharmacy Services and Pharmacist  Review  MicDebbora DusPP notified  VelAvel SensorCMLowryssistant [phone] have reviewed the care management and care coordination activities outlined in this encounter and I am certifying that I agree with the content of this note. No further action required.  MicDebbora DusharmD Clinical Pharmacist [facility] at [phone]

## 2020-12-09 ENCOUNTER — Other Ambulatory Visit: Payer: Self-pay | Admitting: *Deleted

## 2020-12-09 ENCOUNTER — Telehealth: Payer: Self-pay

## 2020-12-09 NOTE — Telephone Encounter (Signed)
Last office visit 05/30/2020 for 6 month follow up with D. [name].  Last refilled Gabapentin 09/05/2020 for #360 with no refills.  Carvedilol 09/05/2020 for #180 with no refills.  Patient no showed TOC appointment with Dr. Diona Browner.  Not future appointments at [facility].

## 2020-12-09 NOTE — Chronic Care Management (AMB) (Addendum)
Chronic Care Management Pharmacy Assistant   Name: Randy [name]  MRN: [id number] DOB: 1946-02-28   Reason for Encounter: Medication Adherence and Delivery Coordination   Recent office visits:  No office visits since last CCM contact  Recent consult visits:  No office visits since last CCM contact  Hospital visits:  None in previous 6 months  Medications: Outpatient Encounter Medications as of 12/09/2020  Medication Sig   Accu-Chek Softclix Lancets lancets USE AS DIRECTED  TO TEST BLOOD SUGAR ONE TIME DAILY  AND AS NEEDED   Alcohol Swabs (B-D SINGLE USE SWABS REGULAR) PADS Use as instructed to clean area for glucose monitoring once daily and as needed.  Diagnosis:  E11.21  Non insulin-dependent   allopurinol (ZYLOPRIM) 100 MG tablet Take 0.5 tablets (50 mg total) by mouth daily.   apixaban (ELIQUIS) 5 MG TABS tablet Take 1 tablet (5 mg total) by mouth 2 (two) times daily.   atorvastatin (LIPITOR) 80 MG tablet TAKE 1 TABLET (80 MG TOTAL) DAILY AT 6 PM.   Blood Glucose Calibration (ACCU-CHEK AVIVA) SOLN Use to calibrate blood glucose machine as recommended.  Diagnosis:  E11.21  Non-insulin dependent.   Blood Glucose Monitoring Suppl (ACCU-CHEK GUIDE) w/Device KIT Use to check blood sugar once daily.   carvedilol (COREG) 3.125 MG tablet Take 1 tablet (3.125 mg total) by mouth 2 (two) times daily.   ENTRESTO 24-26 MG TAKE ONE TABLET BY MOUTH EVERY MORNING and TAKE ONE TABLET BY MOUTH EVERY EVENING   ezetimibe (ZETIA) 10 MG tablet Take 1 tablet (10 mg total) by mouth daily.   finasteride (PROSCAR) 5 MG tablet Take 1 tablet (5 mg total) by mouth daily.   furosemide (LASIX) 20 MG tablet TAKE ONE TABLET BY MOUTH EVERY MORNING   gabapentin (NEURONTIN) 300 MG capsule Take 2 capsules (600 mg total) by mouth 2 (two) times daily.   glipiZIDE (GLUCOTROL XL) 10 MG 24 hr tablet Take 1 tablet (10 mg total) by mouth daily with breakfast.   glucose blood (ACCU-CHEK AVIVA PLUS) test strip Test  Blood Sugar 1 time daily: Dx:E11.27   indomethacin (INDOCIN) 25 MG capsule Take 1 capsule (25 mg total) by mouth 3 (three) times daily as needed.   tamsulosin (FLOMAX) 0.4 MG CAPS capsule Take 1 capsule (0.4 mg total) by mouth daily.   No facility-administered encounter medications on file as of 12/09/2020.    BP Readings from Last 3 Encounters:  05/30/20 116/62  01/06/20 (!) 94/58  11/20/19 (!) 100/58    Lab Results  Component Value Date   HGBA1C 7.9 (H) 05/30/2020     No OVs, Consults, or hospital visits since last care coordination call / Pharmacist visit. No medication changes indicated  Patient obtains medications through Adherence Packaging  30 Days   Last adherence delivery date: 11/18/20 Packs, 30 DS  Patient is due for next adherence delivery on: 12/16/20  Spoke with patient on 12/12/20 and reviewed medications and coordinated delivery.  This delivery to include: Adherence Packaging  30 Days  Vial: Zetia 10 mg 1 tablet daily (Breakfast) Tamsulosin 0.4 mg 1 capsule daily (evening meal) Finasteride 5 mg 1 tablet daily (breakfast) Atorvastatin 80 mg 1 tablet daily (breakfast) Glipizide 10 mg 1 tablet daily (breakfast) Furosemide 20 mg 1 tablet daily (breakfast) Allopurinol 100 mg 1/2 tablet daily (1/2 breakfast) Gabapentin 300 mg 2 tablets twice daily (2 breakfast, 2 evening meal) Carvedilol 3.125 mg 1 tablet twice daily (1 breakfast, 1 evening meal)  Patient declined the following  medications this month: Accu-chek softclix lancets- use once daily as directed - received 90 DS on 10/18/20 Accu-chek Aviva plus test strips- Use once daily -received 90 DS on 10/18/20 Entresto 24-17m 1 tablet twice daily (1- breakfast,1- bedtime) - on HOLD due to cost    Refills requested from PCP include:  Requested from Dr. BDiona Browner5/13/22 Gabapentin 300 mg 2 tablets twice daily Carvedilol 3.125 mg 1 tablet twice daily   Confirmed delivery date of 12/16/20, advised patient that  pharmacy will contact him the morning of delivery.  Recent blood pressure readings are as follows: Does not check BP  Recent blood glucose readings are as follows: Does not have any readings. States he has been feeling great and does not feel like he needed to check.   Patient's insurance is no longer active per pharmacy when processing medications. Patient unaware of his insurance being cancelled. He will get all meds at cash price this month and will hold Entresto due to cost - $600 for 30 DS.   Follow-Up:  Coordination of Enhanced Pharmacy Services and Pharmacist Review  MDebbora Dus CPP notified  LMargaretmary Dys CKramerPharmacy Assistant [phone] I have reviewed the care management and care coordination activities outlined in this encounter and I am certifying that I agree with the content of this note. No further action required.  MDebbora Dus PharmD Clinical Pharmacist [facility] at [phone]

## 2020-12-09 NOTE — Telephone Encounter (Signed)
-----   Message from [place] sent at 12/09/2020 12:04 PM EDT ----- Regarding: Refill Patient needs refills on Gabapentin and Carvedilol. If appropriate will you please send to [facility]?   Thank you,  Margaretmary Dys, [place] Pharmacy Assistant [phone]

## 2020-12-09 NOTE — Chronic Care Management (AMB) (Incomplete Revision)
Chronic Care Management Pharmacy Assistant   Name: Randy [name]  MRN: [id number] DOB: 1945-09-13   Reason for Encounter: Medication Adherence and Delivery Coordination   Recent office visits:  No office visits since last CCM contact  Recent consult visits:  No office visits since last CCM contact  Hospital visits:  None in previous 6 months  Medications: Outpatient Encounter Medications as of 12/09/2020  Medication Sig   Accu-Chek Softclix Lancets lancets USE AS DIRECTED  TO TEST BLOOD SUGAR ONE TIME DAILY  AND AS NEEDED   Alcohol Swabs (B-D SINGLE USE SWABS REGULAR) PADS Use as instructed to clean area for glucose monitoring once daily and as needed.  Diagnosis:  E11.21  Non insulin-dependent   allopurinol (ZYLOPRIM) 100 MG tablet Take 0.5 tablets (50 mg total) by mouth daily.   apixaban (ELIQUIS) 5 MG TABS tablet Take 1 tablet (5 mg total) by mouth 2 (two) times daily.   atorvastatin (LIPITOR) 80 MG tablet TAKE 1 TABLET (80 MG TOTAL) DAILY AT 6 PM.   Blood Glucose Calibration (ACCU-CHEK AVIVA) SOLN Use to calibrate blood glucose machine as recommended.  Diagnosis:  E11.21  Non-insulin dependent.   Blood Glucose Monitoring Suppl (ACCU-CHEK GUIDE) w/Device KIT Use to check blood sugar once daily.   carvedilol (COREG) 3.125 MG tablet Take 1 tablet (3.125 mg total) by mouth 2 (two) times daily.   ENTRESTO 24-26 MG TAKE ONE TABLET BY MOUTH EVERY MORNING and TAKE ONE TABLET BY MOUTH EVERY EVENING   ezetimibe (ZETIA) 10 MG tablet Take 1 tablet (10 mg total) by mouth daily.   finasteride (PROSCAR) 5 MG tablet Take 1 tablet (5 mg total) by mouth daily.   furosemide (LASIX) 20 MG tablet TAKE ONE TABLET BY MOUTH EVERY MORNING   gabapentin (NEURONTIN) 300 MG capsule Take 2 capsules (600 mg total) by mouth 2 (two) times daily.   glipiZIDE (GLUCOTROL XL) 10 MG 24 hr tablet Take 1 tablet (10 mg total) by mouth daily with breakfast.   glucose blood (ACCU-CHEK AVIVA PLUS) test strip Test  Blood Sugar 1 time daily: Dx:E11.27   indomethacin (INDOCIN) 25 MG capsule Take 1 capsule (25 mg total) by mouth 3 (three) times daily as needed.   tamsulosin (FLOMAX) 0.4 MG CAPS capsule Take 1 capsule (0.4 mg total) by mouth daily.   No facility-administered encounter medications on file as of 12/09/2020.    BP Readings from Last 3 Encounters:  05/30/20 116/62  01/06/20 (!) 94/58  11/20/19 (!) 100/58    Lab Results  Component Value Date   HGBA1C 7.9 (H) 05/30/2020     No OVs, Consults, or hospital visits since last care coordination call / Pharmacist visit. No medication changes indicated  Patient obtains medications through Adherence Packaging  30 Days   Last adherence delivery date: 11/18/20 Packs, 30 DS  Patient is due for next adherence delivery on: 12/16/20  Spoke with patient on 12/12/20 and reviewed medications and coordinated delivery.  This delivery to include: Adherence Packaging  90 Days  Vial: Zetia 10 mg 1 tablet daily (Breakfast) Tamsulosin 0.4 mg 1 capsule daily (evening meal) Finasteride 5 mg 1 tablet daily (breakfast) Atorvastatin 80 mg 1 tablet daily (breakfast) Glipizide 10 mg 1 tablet daily (breakfast)- Needs 12/12/20. Actue form turned in for delivery 12/12/20 Furosemide 20 mg 1 tablet daily (breakfast) Entresto 24-58m 1 tablet twice daily (1- breakfast,1- bedtime) Allopurinol 100 mg 1/2 tablet daily (breakfast) Gabapentin 300 mg 2 tablets twice daily Carvedilol 3.125 mg 1 tablet  twice daily   Patient declined the following medications this month: Accu-chek softclix lancets- use once daily as directed - received 90 DS on 10/18/20 Accu-chek Aviva plus test strips- Use once daily -received 90 DS on 10/18/20  Refills requested from PCP include:  Requested from Dr. Diona Browner 12/09/20 Gabapentin 300 mg 2 tablets twice daily Carvedilol 3.125 mg 1 tablet twice daily   Confirmed delivery date of 12/16/20, advised patient that pharmacy will contact him the morning  of delivery.  Recent blood pressure readings are as follows: Does not check BP   Recent blood glucose readings are as follows: Does not have any readings. States he has been feeling great and does not feel like he needed to check.   Glipizide 10 mg 1 tablet daily- Needs 12/12/20. Actue form turned in for delivery 12/12/20 Will sync with other medications. Patient states he would like to switch to 90 day supply vials.   Follow-Up:  Coordination of Enhanced Pharmacy Services and Pharmacist Review  Debbora Dus, CPP notified  Margaretmary Dys, [place] [phone]  Total time spent for month:  CPA 40

## 2020-12-11 MED ORDER — CARVEDILOL 3.125 MG PO TABS: 3.1250 mg | ORAL_TABLET | Freq: Two times a day (BID) | ORAL | 0 refills | Status: DC

## 2020-12-11 MED ORDER — GABAPENTIN 300 MG PO CAPS: 600.0000 mg | ORAL_CAPSULE | Freq: Two times a day (BID) | ORAL | 0 refills | Status: DC

## 2020-12-13 ENCOUNTER — Telehealth: Payer: Self-pay

## 2020-12-13 NOTE — Telephone Encounter (Signed)
Contacted patient to encourage him to reschedule his missed [facility] appointment with Dr. Diona Browner and collect updated insurance card information for his prescriptions. Left VM for return call.  Debbora Dus, PharmD Clinical Pharmacist [place] [facility] at [facility] [phone]

## 2020-12-23 ENCOUNTER — Other Ambulatory Visit: Payer: Self-pay | Admitting: *Deleted

## 2020-12-23 DIAGNOSIS — E790 Hyperuricemia without signs of inflammatory arthritis and tophaceous disease: Secondary | ICD-10-CM

## 2020-12-23 NOTE — Telephone Encounter (Signed)
.  Note Last office visit 05/30/2020 for 6 month follow up with D. [name]. Entresto last refilled 07/12/2020 for #180 with 1 refill.  Allopurinol 07/18/2020 for #45 with 1 refill.  Furosemide 07/12/2020 for #90 with 1 refill   Patient no showed TOC appointment with Dr. Diona Browner.  Not future appointments at [facility].

## 2020-12-24 MED ORDER — ALLOPURINOL 100 MG PO TABS: 50.0000 mg | ORAL_TABLET | Freq: Every day | ORAL | 0 refills | Status: DC

## 2020-12-24 MED ORDER — ENTRESTO 24-26 MG PO TABS: ORAL_TABLET | ORAL | 0 refills | Status: DC

## 2020-12-24 MED ORDER — FUROSEMIDE 20 MG PO TABS: 20.0000 mg | ORAL_TABLET | Freq: Every morning | ORAL | 0 refills | Status: DC

## 2020-12-29 ENCOUNTER — Telehealth: Payer: Self-pay

## 2020-12-29 NOTE — Chronic Care Management (AMB) (Addendum)
Patient is out of Delene Loll and unable to afford due to his insurance lapse. Patient assistance form completed for Entresto 24-26 mg and sent to [employer] for printing. The patient will go by and fill out his portion. Debbora Dus, CPP notified  [name], [place] Assistant [phone]  I have reviewed the care management and care coordination activities outlined in this encounter and I am certifying that I agree with the content of this note. No further action required.  Debbora Dus, PharmD Clinical Pharmacist [place] [facility] at [facility] [phone]

## 2020-12-30 ENCOUNTER — Telehealth: Payer: Self-pay | Admitting: Family Medicine

## 2020-12-30 NOTE — Telephone Encounter (Signed)
Pt came in office to sign patient assitance form. Place in [place] folder

## 2021-01-03 ENCOUNTER — Ambulatory Visit (INDEPENDENT_AMBULATORY_CARE_PROVIDER_SITE_OTHER): Payer: Medicare (Managed Care)

## 2021-01-03 DIAGNOSIS — I255 Ischemic cardiomyopathy: Secondary | ICD-10-CM

## 2021-01-03 NOTE — Telephone Encounter (Signed)
Application reviewed and placed in Dr. Rometta Emery box for signature.  Debbora Dus, PharmD Clinical Pharmacist [place] [facility] at [facility] [phone]

## 2021-01-04 ENCOUNTER — Ambulatory Visit (INDEPENDENT_AMBULATORY_CARE_PROVIDER_SITE_OTHER): Payer: Medicare (Managed Care)

## 2021-01-04 DIAGNOSIS — Z95810 Presence of automatic (implantable) cardiac defibrillator: Secondary | ICD-10-CM

## 2021-01-04 DIAGNOSIS — I5022 Chronic systolic (congestive) heart failure: Secondary | ICD-10-CM | POA: Diagnosis not present

## 2021-01-04 LAB — CUP PACEART REMOTE DEVICE CHECK
Battery Remaining Longevity: 57 mo
Battery Remaining Percentage: 58 %
Battery Voltage: 2.93 V
Brady Statistic AP VP Percent: 1 %
Brady Statistic AP VS Percent: 2.7 %
Brady Statistic AS VP Percent: 1 %
Brady Statistic AS VS Percent: 95 %
Brady Statistic RA Percent Paced: 1 %
Brady Statistic RV Percent Paced: 1 %
Date Time Interrogation Session: 20220608015016
HighPow Impedance: 73 Ohm
HighPow Impedance: 73 Ohm
Implantable Lead Implant Date: 20091022
Implantable Lead Implant Date: 20180322
Implantable Lead Location: 753859
Implantable Lead Location: 753860
Implantable Lead Model: 7121
Implantable Pulse Generator Implant Date: 20180322
Lead Channel Impedance Value: 1550 Ohm
Lead Channel Impedance Value: 290 Ohm
Lead Channel Pacing Threshold Amplitude: 0.5 V
Lead Channel Pacing Threshold Amplitude: 1.25 V
Lead Channel Pacing Threshold Pulse Width: 0.5 ms
Lead Channel Pacing Threshold Pulse Width: 0.8 ms
Lead Channel Sensing Intrinsic Amplitude: 1.6 mV
Lead Channel Sensing Intrinsic Amplitude: 12 mV
Lead Channel Setting Pacing Amplitude: 2 V
Lead Channel Setting Pacing Amplitude: 2.5 V
Lead Channel Setting Pacing Pulse Width: 0.8 ms
Lead Channel Setting Sensing Sensitivity: 0.5 mV
Pulse Gen Serial Number: 7413245

## 2021-01-04 NOTE — Progress Notes (Signed)
EPIC Encounter for ICM Monitoring  Patient Name: Randy [name] is a 75 y.o. male Date: 01/04/2021 Primary Care Physican: Elby Beck, FNP (Inactive) Primary Cardiologist:Taylor Electrophysiologist:Taylor 10/12/2021Weight: 180lbs   AT/AF Burden: <1% (taking Aspirin)  Attempted call to patient and unable to reach.   Transmission reviewed.   Corvue thoracic impedancesuggestingpossible fluid accumulation starting 12/27/2020.  Prescribed: Furosemide20 mg 1 tablet daily  Labs: 05/30/2020 Creatinine 1.63, BUN 26, Potassium 4.4, Sodium 140, GFR 41.24 08/05/2019 Creatinine 1.94, BUN 39, Potassium 5.1, Sodium 137, GFR 34.02 06/10/2019 Creatinine 2.65, BUN 42, Potassium 5.1, Sodium 139 A complete set of results can be found in Results Review.  Recommendations:Unable to reach.    Follow-up plan: ICM clinic phone appointment on 01/12/2021 to recheck fluid levels.91 day device clinic remote transmission scheduled for9/12/2020.  EP/Cardiology Office Visits:Recall 6/6/2022with Dr. Lovena Le.   Copy of ICM check sent to Dr.Taylor.  3 month ICM trend: 01/04/2021.    1 Year ICM trend:       Rosalene Billings, RN 01/04/2021 9:18 AM

## 2021-01-09 ENCOUNTER — Telehealth: Payer: Self-pay

## 2021-01-09 NOTE — Chronic Care Management (AMB) (Addendum)
Chronic Care Management Pharmacy Assistant   Name: Randy [name]  MRN: [id number] DOB: 10-20-1945  Reason for Encounter: Medication Adherence and Delivery Coordination   Recent office visits:  None since last CCM contact  Recent consult visits:  None since last CCM contact  Hospital visits:  None in previous 6 months  Medications: Outpatient Encounter Medications as of 01/09/2021  Medication Sig   Accu-Chek Softclix Lancets lancets USE AS DIRECTED  TO TEST BLOOD SUGAR ONE TIME DAILY  AND AS NEEDED   Alcohol Swabs (B-D SINGLE USE SWABS REGULAR) PADS Use as instructed to clean area for glucose monitoring once daily and as needed.  Diagnosis:  E11.21  Non insulin-dependent   allopurinol (ZYLOPRIM) 100 MG tablet Take 0.5 tablets (50 mg total) by mouth daily.   apixaban (ELIQUIS) 5 MG TABS tablet Take 1 tablet (5 mg total) by mouth 2 (two) times daily.   atorvastatin (LIPITOR) 80 MG tablet TAKE 1 TABLET (80 MG TOTAL) DAILY AT 6 PM.   Blood Glucose Calibration (ACCU-CHEK AVIVA) SOLN Use to calibrate blood glucose machine as recommended.  Diagnosis:  E11.21  Non-insulin dependent.   Blood Glucose Monitoring Suppl (ACCU-CHEK GUIDE) w/Device KIT Use to check blood sugar once daily.   carvedilol (COREG) 3.125 MG tablet Take 1 tablet (3.125 mg total) by mouth 2 (two) times daily.   ezetimibe (ZETIA) 10 MG tablet Take 1 tablet (10 mg total) by mouth daily.   finasteride (PROSCAR) 5 MG tablet Take 1 tablet (5 mg total) by mouth daily.   furosemide (LASIX) 20 MG tablet Take 1 tablet (20 mg total) by mouth every morning.   gabapentin (NEURONTIN) 300 MG capsule Take 2 capsules (600 mg total) by mouth 2 (two) times daily.   glipiZIDE (GLUCOTROL XL) 10 MG 24 hr tablet Take 1 tablet (10 mg total) by mouth daily with breakfast.   glucose blood (ACCU-CHEK AVIVA PLUS) test strip Test Blood Sugar 1 time daily: Dx:E11.27   indomethacin (INDOCIN) 25 MG capsule Take 1 capsule (25 mg total) by mouth 3  (three) times daily as needed.   sacubitril-valsartan (ENTRESTO) 24-26 MG TAKE ONE TABLET BY MOUTH EVERY MORNING and TAKE ONE TABLET BY MOUTH EVERY EVENING   tamsulosin (FLOMAX) 0.4 MG CAPS capsule Take 1 capsule (0.4 mg total) by mouth daily.   No facility-administered encounter medications on file as of 01/09/2021.   BP Readings from Last 3 Encounters:  05/30/20 116/62  01/06/20 (!) 94/58  11/20/19 (!) 100/58    Lab Results  Component Value Date   HGBA1C 7.9 (H) 05/30/2020   Patient is due for delivery on 01/18/21 from [facility]. Contacted patient to review medications:  This delivery to include: Adherence Packaging  30 Days  Packs: Tamsulosin 0.21m - 1 daily (evening meal) Zetia 10 mg- 1 tablet daily (breakfast)  Allopurinol 100 mg - 1/2 tablet daily (breakfast) Glipizide ER 10 mg - 1 daily with BF (breakfast) Finasteride 5 mg - 1 daily (breakfast) Furosemide 20 mg - 1 daily (breakfast) Atorvastatin 80 mg - 1 daily (breakfast)  Refills needed: Gabapentin 300 mg - 2 BID (breakfast and evening meal) Carvedilol 3.125 mg - 1 BID (breakfast and evening meal)  Unable to afford at this time due to insurance change: Entresto 24-26 mg - 1 tablet BID   Follow-Up:  CComptrollerand Pharmacist Review  MDebbora Dus CPP notified  VAvel Sensor CCape GirardeauAssistant [phone] I have reviewed the care management and care  coordination activities outlined in this encounter and I am certifying that I agree with the content of this note. Patient has not established care with PCP so medications will not be refilled until he schedules a visit. Memory seems to be a concern. Until then, we are able to send packs with the above medications. I also believe he is experiencing insurance fraud and I have reached out to the CCM social worker for assistance.   Debbora Dus, PharmD Clinical Pharmacist [facility] at [facility] [phone]

## 2021-01-09 NOTE — Telephone Encounter (Signed)
Patient's health insurance was terminated 11/27/20. Patient does not know reason for termination and has not been able to call insurance on his own. He seems to forget things easily. He also has not been keeping appointments or answering calls. I think he may benefit from a Copywriter, advertising. Recommend referral to CCM Education officer, museum.  Debbora Dus, PharmD Clinical Pharmacist [facility] at [facility] [phone]

## 2021-01-11 ENCOUNTER — Telehealth: Payer: Self-pay

## 2021-01-11 NOTE — Telephone Encounter (Signed)
Received fax from [facility] to start getting his medications through them: Allopurinol, atorvastatin, carvedilol, Entresto, Zetia, finasteride, and furosemide.  Note Last office visit 05/30/2020 for 6 month follow up with D. [name]. Entresto last refilled 07/12/2020 for #180 with 1 refill.  Allopurinol 07/18/2020 for #45 with 1 refill.  Furosemide 07/12/2020 for #90 with 1 refill   Atorvastatin last filled 01-04-20 #90/1. Carvedilol last filled by [facility] Quint 12-11-20 #60/0. Finasteride last written 05-23-20 #90/0. Patient no showed TOC appointment with Dr. Diona Browner.  Not future appointments at [facility].

## 2021-01-13 NOTE — Progress Notes (Signed)
No ICM remote transmission received for 01/12/2021 and next ICM transmission scheduled for 01/23/2021.

## 2021-01-16 ENCOUNTER — Other Ambulatory Visit: Payer: Self-pay | Admitting: Family

## 2021-01-23 ENCOUNTER — Ambulatory Visit (INDEPENDENT_AMBULATORY_CARE_PROVIDER_SITE_OTHER): Payer: Medicare (Managed Care)

## 2021-01-23 DIAGNOSIS — I5022 Chronic systolic (congestive) heart failure: Secondary | ICD-10-CM

## 2021-01-23 DIAGNOSIS — Z95810 Presence of automatic (implantable) cardiac defibrillator: Secondary | ICD-10-CM

## 2021-01-25 NOTE — Progress Notes (Signed)
EPIC Encounter for ICM Monitoring  Patient Name: Randy [name] is a 75 y.o. male Date: 01/25/2021 Primary Care Physican: Elby Beck, [place] Primary Cardiologist: Lovena Le Electrophysiologist: Lovena Le Last Weight: 180 lbs   AT/AF Burden: <1% (taking Aspirin)   Transmission reviewed.    Corvue thoracic impedance suggesting fluid levels returned to normal.   Prescribed: Furosemide 20 mg 1 tablet daily   Labs: 05/30/2020 Creatinine 1.63, BUN 26, Potassium 4.4, Sodium 140, GFR 41.24 08/05/2019 Creatinine 1.94, BUN 39, Potassium 5.1, Sodium 137, GFR 34.02 06/10/2019 Creatinine 2.65, BUN 42, Potassium 5.1, Sodium 139 A complete set of results can be found in Results Review.   Recommendations: No changes     Follow-up plan: ICM clinic phone appointment on 02/06/2021.  91 day device clinic remote transmission scheduled for 04/04/2021.    EP/Cardiology Office Visits: Recall 01/02/2021 with Dr. Lovena Le.     Copy of ICM check sent to Dr. Lovena Le.      3 month ICM trend: 01/25/2021.    1 Year ICM trend:       Rosalene Billings, RN 01/25/2021 8:20 AM

## 2021-01-25 NOTE — Progress Notes (Signed)
Remote ICD transmission.   

## 2021-01-26 NOTE — Telephone Encounter (Signed)
If you could still call, I think he needs some assistance. He is not able to use his insurance because he does not know how to get an insurance card and I'm concerned about potential fraud.   Debbora Dus, PharmD Clinical Pharmacist [facility] at [facility] [phone]

## 2021-01-26 NOTE — Telephone Encounter (Addendum)
My assistant was able to three-way call [employer] insurance with patient and they found out he is enrolled in [place] insurance for 30 days but then will be enrolled in a different insurance plan every 30 days. He would be enrolled in [place] on 01/27/21. Patient was unaware of this change. A phone number was provided to call to re-enroll with [facility] but patient was unable to complete call.  Debbora Dus, PharmD Clinical Pharmacist [facility] at [facility] [phone]

## 2021-01-27 ENCOUNTER — Telehealth: Payer: Self-pay | Admitting: Family Medicine

## 2021-01-27 ENCOUNTER — Telehealth: Payer: Self-pay

## 2021-01-27 NOTE — Chronic Care Management (AMB) (Signed)
  Chronic Care Management   Note  01/27/2021 Name: Randy [name] MRN: [id number] DOB: June 23, 1946  Randy [name] is a 75 y.o. year old male who is a primary care patient of Elby Beck, FNP. Randy [name] is currently enrolled in care management services. An additional referral for LCSW was placed.   Follow up plan: Telephone appointment with care management team member scheduled for:02/13/2021  Noreene Larsson, [place], [place], [place] [address] Direct Dial: [phone] [email or web address]: [email or web address]

## 2021-01-27 NOTE — Telephone Encounter (Signed)
Patient returned call . Would like a call back 

## 2021-01-31 NOTE — Telephone Encounter (Signed)
Left voice message to to call the office

## 2021-01-31 NOTE — Telephone Encounter (Signed)
The pt was called because Randy Quint, NP denied his medication refills stating he needed an OV before more refills. Please help get him scheduled.

## 2021-02-02 ENCOUNTER — Telehealth: Payer: Self-pay

## 2021-02-02 NOTE — Telephone Encounter (Signed)
Patient approved for [facility] assistance through July 29, 2021.  Debbora Dus, PharmD Clinical Pharmacist [facility] at [facility] [phone]

## 2021-02-06 ENCOUNTER — Ambulatory Visit (INDEPENDENT_AMBULATORY_CARE_PROVIDER_SITE_OTHER): Payer: Medicare Other

## 2021-02-06 DIAGNOSIS — Z95810 Presence of automatic (implantable) cardiac defibrillator: Secondary | ICD-10-CM | POA: Diagnosis not present

## 2021-02-06 DIAGNOSIS — I5022 Chronic systolic (congestive) heart failure: Secondary | ICD-10-CM | POA: Diagnosis not present

## 2021-02-07 ENCOUNTER — Telehealth: Payer: Self-pay

## 2021-02-07 NOTE — Telephone Encounter (Signed)
Ok. We can take care of it then.

## 2021-02-07 NOTE — Chronic Care Management (AMB) (Addendum)
Chronic Care Management Pharmacy Assistant   Name: Randy [name]  MRN: [id number] DOB: [birth year]  Reason for Encounter: Medication Adherence and Delivery Coordination   Recent office visits:  None since last CCM contact  Recent consult visits:  None since last CCM contact  Hospital visits:  None in 6 months  Medications: Outpatient Encounter Medications as of 02/07/2021  Medication Sig   Accu-Chek Softclix Lancets lancets USE AS DIRECTED  TO TEST BLOOD SUGAR ONE TIME DAILY  AND AS NEEDED   Alcohol Swabs (B-D SINGLE USE SWABS REGULAR) PADS Use as instructed to clean area for glucose monitoring once daily and as needed.  Diagnosis:  E11.21  Non insulin-dependent   allopurinol (ZYLOPRIM) 100 MG tablet Take 0.5 tablets (50 mg total) by mouth daily.   apixaban (ELIQUIS) 5 MG TABS tablet Take 1 tablet (5 mg total) by mouth 2 (two) times daily.   atorvastatin (LIPITOR) 80 MG tablet TAKE 1 TABLET (80 MG TOTAL) DAILY AT 6 PM.   Blood Glucose Calibration (ACCU-CHEK AVIVA) SOLN Use to calibrate blood glucose machine as recommended.  Diagnosis:  E11.21  Non-insulin dependent.   Blood Glucose Monitoring Suppl (ACCU-CHEK GUIDE) w/Device KIT Use to check blood sugar once daily.   carvedilol (COREG) 3.125 MG tablet Take 1 tablet (3.125 mg total) by mouth 2 (two) times daily.   ezetimibe (ZETIA) 10 MG tablet Take 1 tablet (10 mg total) by mouth daily.   finasteride (PROSCAR) 5 MG tablet Take 1 tablet (5 mg total) by mouth daily.   furosemide (LASIX) 20 MG tablet Take 1 tablet (20 mg total) by mouth every morning.   gabapentin (NEURONTIN) 300 MG capsule Take 2 capsules (600 mg total) by mouth 2 (two) times daily.   glipiZIDE (GLUCOTROL XL) 10 MG 24 hr tablet Take 1 tablet (10 mg total) by mouth daily with breakfast.   glucose blood (ACCU-CHEK AVIVA PLUS) test strip Test Blood Sugar 1 time daily: Dx:E11.27   indomethacin (INDOCIN) 25 MG capsule Take 1 capsule (25 mg total) by mouth 3 (three)  times daily as needed.   sacubitril-valsartan (ENTRESTO) 24-26 MG TAKE ONE TABLET BY MOUTH EVERY MORNING and TAKE ONE TABLET BY MOUTH EVERY EVENING   tamsulosin (FLOMAX) 0.4 MG CAPS capsule Take 1 capsule (0.4 mg total) by mouth daily.   No facility-administered encounter medications on file as of 02/07/2021.   BP Readings from Last 3 Encounters:  05/30/20 116/62  01/06/20 (!) 94/58  11/20/19 (!) 100/58    Lab Results  Component Value Date   HGBA1C 7.9 (H) 05/30/2020    No OVs, Consults, or hospital visits since last care coordination call / Pharmacist visit.  Patient obtains medications through Adherence Packaging  30 Days   Last adherence delivery date: 01/18/2021     Packs, 30 DS: Tamsulosin 0.65m - 1 daily (evening meal) Zetia 10 mg- 1 tablet daily (breakfast)  Allopurinol 100 mg - 1/2 tablet daily (breakfast) Glipizide ER 10 mg - 1 daily with BF (breakfast) Finasteride 5 mg - 1 daily (breakfast) Furosemide 20 mg - 1 daily (breakfast) Atorvastatin 80 mg - 1 daily (breakfast)   Not sent, no refills: Gabapentin 300 mg - 2 BID (breakfast and evening meal) Carvedilol 3.125 mg - 1 BID (breakfast and evening meal)   Unable to afford last month due to insurance change: Entresto 24-26 mg - 1 tablet BID    Patient is due for next adherence delivery on: 02/16/2021  Spoke with patient on 02/07/2021 reviewed medications  and coordinated delivery.  This delivery to include: Vials  30 Days  Vials Tamsulosin 0.86m - 1 daily (evening meal) Atorvastatin 80 mg - 1 daily (breakfast) Glipizide ER 10 mg - 1 daily  (breakfast) Finasteride 5 mg - 1 daily (breakfast) Allopurinol 100 mg - 1/2 tablet daily (breakfast) Zetia 10 mg- 1 tablet daily (breakfast)  Furosemide 20 mg - 1 daily (breakfast) Pending visit with [place] on 7/20 -  Gabapentin 300 mg - 2 BID (breakfast and evening meal) Carvedilol 3.125 mg - 1 BID (breakfast and evening meal)  PRN/VIAL medications: none  Patient declined  the following medications this month: Entresto 24-26 mg - 1 tablet BID (Pt approved for patient assistance. He has not received it yet. Will check with program to see if its been shipped.)  No BP/BG readings available at this time   Confirmed delivery date of 02/16/2021, advised patient that pharmacy will contact him the morning of delivery.  Follow-Up:  Coordination of Enhanced Pharmacy Services and Pharmacist Review  MDebbora Dus CPP notified  VAvel Sensor CCanyonAssistant [phone] I have reviewed the care management and care coordination activities outlined in this encounter and I am certifying that I agree with the content of this note. No further action required.  MDebbora Dus PharmD Clinical Pharmacist [phone]

## 2021-02-07 NOTE — Telephone Encounter (Signed)
[facility] with [facility] left v/m that pt has appt with Dr Silvio Pate on 02/15/21 at 11 AM for med refills. [facility]. At the [facility]/15/21 visit [facility] with Refills requested from PCP include:Gabapentin 300 mg - 2 BID (breakfast and evening meal) Carvedilol 3.125 mg - 1 BID (breakfast and evening meal) Furosemide 20 mg - 1 daily (breakfastppt ).  This is FYI for Dr Silvio Pate if he approves these 3 med refills at pts appt on 02/15/21. Can see telephone note for [facility] on 02/07/21. Sending note to Dr Silvio Pate and Larene Beach CMA.

## 2021-02-07 NOTE — Telephone Encounter (Signed)
Spoke with patient scheduled appointment

## 2021-02-08 NOTE — Telephone Encounter (Signed)
I will review these and all meds and do refills at that visit

## 2021-02-09 ENCOUNTER — Telehealth: Payer: Self-pay

## 2021-02-09 NOTE — Telephone Encounter (Signed)
I told the patient I will call him tomorrow to get help with his monitor.

## 2021-02-10 ENCOUNTER — Telehealth: Payer: Self-pay

## 2021-02-10 NOTE — Progress Notes (Signed)
EPIC Encounter for ICM Monitoring  Patient Name: Randy [name] is a 75 y.o. male Date: 02/10/2021 Primary Care Physican: Elby Beck, [place] Primary Cardiologist: Lovena Le Electrophysiologist: Lovena Le Last Weight: 180 lbs   AT/AF Burden: <1% (taking Aspirin)   Attempted call to patient and unable to reach.   Transmission reviewed.    Corvue thoracic impedance suggesting normal fluid levels.   Prescribed: Furosemide 20 mg 1 tablet daily   Labs: 05/30/2020 Creatinine 1.63, BUN 26, Potassium 4.4, Sodium 140, GFR 41.24 08/05/2019 Creatinine 1.94, BUN 39, Potassium 5.1, Sodium 137, GFR 34.02 06/10/2019 Creatinine 2.65, BUN 42, Potassium 5.1, Sodium 139 A complete set of results can be found in Results Review.   Recommendations: Unable to reach.     Follow-up plan: ICM clinic phone appointment on 03/13/2021.  91 day device clinic remote transmission scheduled for 04/04/2021.    EP/Cardiology Office Visits: Recall 01/02/2021 with Dr. Lovena Le.     Copy of ICM check sent to Dr. Lovena Le.   3 month ICM trend: 02/10/2021.    Rosalene Billings, RN 02/10/2021 8:00 AM

## 2021-02-10 NOTE — Telephone Encounter (Signed)
I called tech support to attempt to trouble shoot the patient monitor. When I attempted to call the patient his phone was busy.   Tech support states his old cell adapter stopped working in May. They did send him a new one. She told me to ask him if her received the new 4g adapter. If not then to call them back.

## 2021-02-10 NOTE — Progress Notes (Addendum)
Reached out to [employer] to see if they have shipped Entresto to Mr. Jaggers. They attempted to call him on 01/17/2021 to set up and confirm delivery date and the patient did not answer the phone. Per Janett Billow, the program will reach out again to the patient to set up delivery of his approved Entresto 24-26 mg. They do not allow anyone other than patient to confirm shipment.   Debbora Dus, CPP notified  [name], [place] Assistant [phone]  I have reviewed the care management and care coordination activities outlined in this encounter and I am certifying that I agree with the content of this note. No further action required.  Debbora Dus, PharmD Clinical Pharmacist [facility] at [facility] [phone]

## 2021-02-10 NOTE — Telephone Encounter (Signed)
Remote ICM transmission received.  Attempted call to patient regarding ICM remote transmission and left detailed message per DPR.  Advised to return call for any fluid symptoms or questions. Next ICM remote transmission scheduled 03/13/2021.

## 2021-02-13 ENCOUNTER — Telehealth: Payer: Self-pay

## 2021-02-13 ENCOUNTER — Ambulatory Visit (INDEPENDENT_AMBULATORY_CARE_PROVIDER_SITE_OTHER): Payer: Medicare Other | Admitting: *Deleted

## 2021-02-13 DIAGNOSIS — I10 Essential (primary) hypertension: Secondary | ICD-10-CM | POA: Diagnosis not present

## 2021-02-13 DIAGNOSIS — E1121 Type 2 diabetes mellitus with diabetic nephropathy: Secondary | ICD-10-CM

## 2021-02-13 DIAGNOSIS — R413 Other amnesia: Secondary | ICD-10-CM

## 2021-02-13 NOTE — Telephone Encounter (Signed)
Spoke with patient's son to remind of visit this week for medication refills. He plans to attend visit on 7/20 with patient. Explained insurance situation and I believe patient's current insurance is [employer]. Provided my phone number for any pharmacy concerns.  Debbora Dus, PharmD Clinical Pharmacist [facility] at [facility] [phone]

## 2021-02-14 NOTE — Chronic Care Management (AMB) (Signed)
Chronic Care Management    Clinical Social Work Note  02/14/2021 Name: Randy [name] MRN: [id number] DOB: 07-30-1946  Randy [name] is a 75 y.o. year old male who is a primary care patient of Elby Beck, FNP. The CCM team was consulted to assist the patient with chronic disease management and/or care coordination needs related to: [employer]  and legal concerns/needs .   Engaged with patient by telephone for initial visit in response to provider referral for social work chronic care management and care coordination services.   Consent to Services:  The patient was given information about Chronic Care Management services, agreed to services, and gave verbal consent prior to initiation of services.  Please see initial visit note for detailed documentation.   Patient agreed to services and consent obtained.   Assessment: Review of patient past medical history, allergies, medications, and health status, including review of relevant consultants reports was performed today as part of a comprehensive evaluation and provision of chronic care management and care coordination services.     SDOH (Social Determinants of Health) assessments and interventions performed:    Advanced Directives Status: See Care Plan for related entries.  CCM Care Plan  No Known Allergies  Outpatient Encounter Medications as of 02/13/2021  Medication Sig   Accu-Chek Softclix Lancets lancets USE AS DIRECTED  TO TEST BLOOD SUGAR ONE TIME DAILY  AND AS NEEDED   Alcohol Swabs (B-D SINGLE USE SWABS REGULAR) PADS Use as instructed to clean area for glucose monitoring once daily and as needed.  Diagnosis:  E11.21  Non insulin-dependent   allopurinol (ZYLOPRIM) 100 MG tablet Take 0.5 tablets (50 mg total) by mouth daily.   apixaban (ELIQUIS) 5 MG TABS tablet Take 1 tablet (5 mg total) by mouth 2 (two) times daily.   atorvastatin (LIPITOR) 80 MG tablet TAKE 1 TABLET (80 MG TOTAL) DAILY AT 6 PM.   Blood  Glucose Calibration (ACCU-CHEK AVIVA) SOLN Use to calibrate blood glucose machine as recommended.  Diagnosis:  E11.21  Non-insulin dependent.   Blood Glucose Monitoring Suppl (ACCU-CHEK GUIDE) w/Device KIT Use to check blood sugar once daily.   carvedilol (COREG) 3.125 MG tablet Take 1 tablet (3.125 mg total) by mouth 2 (two) times daily.   ezetimibe (ZETIA) 10 MG tablet Take 1 tablet (10 mg total) by mouth daily.   finasteride (PROSCAR) 5 MG tablet Take 1 tablet (5 mg total) by mouth daily.   furosemide (LASIX) 20 MG tablet Take 1 tablet (20 mg total) by mouth every morning.   gabapentin (NEURONTIN) 300 MG capsule Take 2 capsules (600 mg total) by mouth 2 (two) times daily.   glipiZIDE (GLUCOTROL XL) 10 MG 24 hr tablet Take 1 tablet (10 mg total) by mouth daily with breakfast.   glucose blood (ACCU-CHEK AVIVA PLUS) test strip Test Blood Sugar 1 time daily: Dx:E11.27   indomethacin (INDOCIN) 25 MG capsule Take 1 capsule (25 mg total) by mouth 3 (three) times daily as needed.   sacubitril-valsartan (ENTRESTO) 24-26 MG TAKE ONE TABLET BY MOUTH EVERY MORNING and TAKE ONE TABLET BY MOUTH EVERY EVENING   tamsulosin (FLOMAX) 0.4 MG CAPS capsule Take 1 capsule (0.4 mg total) by mouth daily.   No facility-administered encounter medications on file as of 02/13/2021.    Patient Active Problem List   Diagnosis Date Noted   Idiopathic gout of multiple sites 10/07/2018   Grief 06/16/2018   Dizziness 06/16/2018   Allergic rhinitis 04/24/2018   S/P CABG x 3  09/02/2017   Leukocytosis 08/27/2017   Visit for well man health check 05/07/2017   COPD (chronic obstructive pulmonary disease) ([place]) 51/89/8421   Chronic systolic CHF (congestive heart failure) ([place]) 10/18/2016   Generalized OA 06/22/2014   CKD (chronic kidney disease) stage 3, GFR 30-59 ml/min (HCC)    Synovial cyst of lumbar spine 07/17/2013    Class: Diagnosis of   Osteoarthritis of left hip 09/17/2012   Automatic implantable  cardioverter-defibrillator in situ - St Jude 10/08/8116   Chronic systolic heart failure ([place]) 05/01/2011   IMPOTENCE OF ORGANIC ORIGIN 02/20/2010   Unspecified vitamin D deficiency 02/14/2010   Type 2 diabetes with nephropathy ([place]) 01/11/2010   Implantable cardioverter-defibrillator (ICD) in situ 05/10/2009   HLD (hyperlipidemia) 02/24/2008   Essential hypertension 02/24/2008   Non-ST elevation (NSTEMI) myocardial infarction ([place]) 02/24/2008   HYPERSOMNIA UNSPECIFIED 02/24/2008    Conditions to be addressed/monitored: Dementia and legal concerns/scammed ; Limited social support, Memory Deficits, and Lacks knowledge of community resources  Care Plan : LCSW Plan of Care  Updates made by [name], LCSW since 02/14/2021 12:00 AM     Problem: Psychosocial Adjustment to Dementia-legal/safety plan   Priority: High     Goal: Provide pt/family with resources, support and knowledge to optimize pt's safety, legal concerns, etc   Start Date: 02/13/2021  Expected End Date: 03/28/2021  This Visit's Progress: On track  Priority: High  Note:   Current barriers:   Concerns related to memory loss impacting pt's safety and ability to manage; insurance, etc Currently unable to  independently self manage needs related to chronic health conditions.  Knowledge Deficits related to short term plan for care coordination needs and long term plans for chronic disease management needs Clinical Goals: patient will work with CSW, PCP, and other providers  to address needs related to memory loss, care needs, etc Interventions:  CSW spoke with pt by phone- pt was alert, oriented to name, DOB, address and was able to conversate with CSW. He was not able to discuss the concerns related to his insurance and possible "scamming" as reported by Pharmacist.  Pt gave CSW permission to call pt's son. [name], Randy [name], reports not having much relationship with pt until just recently. Son lives in the [place]  area- discussed with son the concerns about his insurance and possible scamming- son is not aware. CSW discussed with son concerns about pt's cognitive impairment and forgetting appointments, etc. CSW strongly encouraged son to go with pt to the PCP visit this week (02/15/21) as well as to assist pt with completion and with Notary the [place], Advance Directive documents.  Assessment of needs, barriers , as well as how impacting  Review various resources and discussed options  ( POA, Guardianship, etc) Collaboration with PCP regarding development and update of comprehensive plan of care as evidenced by provider attestation and co-signature Inter-disciplinary care team collaboration (see longitudinal plan of care)  Other interventions provided: Solution-Focused Strategies, Active listening / Reflection utilized , [place] and reviewed process , and [place] of Attorney  Patient self-care activities: Over the next 30 days   Call your insurance provider for more information about your Enhanced Benefits  Follow up on insurance options ( [employer], [employer]  and [place]) I have placed a referral to the care guide for community resources, they will follow up with you  Review Guardianship papers, contact DSS at the phone on the front  of the packet with questions  Follow Up Plan:   Appointment scheduled for SW follow up with client by phone on: 02/23/21       Follow Up Plan: Appointment scheduled for SW follow up with client by phone on: 02/23/21      Eduard Clos MSW, [place] Licensed Clinical Social Worker [place] [phone]

## 2021-02-14 NOTE — Patient Instructions (Signed)
Visit Information   PATIENT GOALS:   Goals Addressed               This Visit's Progress     Keeping Myself/Loved One Safe-Dementia (pt-stated)        Timeframe:  Short-Term Goal Priority:  High Start Date:   02/13/21                          Expected End Date:      03/13/21                 Follow Up Date 02/23/21   -discuss concerns related to memory loss with PCP/referral to specialist? -consider completion of legal documents- HCPOA, Durable POA,etc  -consider seeking Guardianship (now or in future) -- use a GPS device in the car    Why is this important?   Safety is important when you or your loved one has dementia.  Eyesight, hearing and changes in feelings of hot and cold or depth-perception may occur.  Wandering or getting lost may happen.  You/your loved one may have to stop driving.  Taking steps to improve safety can prevent injuries.  It will also help you/your loved one feel more relaxed.  It will help you/your loved one be independent for a longer time.     Notes:         Consent to CCM Services: Randy [name] was given information about Chronic Care Management services today including:  CCM service includes personalized support from designated clinical staff supervised by his physician, including individualized plan of care and coordination with other care providers 24/7 contact phone numbers for assistance for urgent and routine care needs. Service will only be billed when office clinical staff spend 20 minutes or more in a month to coordinate care. Only one practitioner may furnish and bill the service in a calendar month. The patient may stop CCM services at any time (effective at the end of the month) by phone call to the office staff. The patient will be responsible for cost sharing (co-pay) of up to 20% of the service fee (after annual deductible is met).  Patient agreed to services and verbal consent obtained.   The patient verbalized understanding of  instructions, educational materials, and care plan provided today and declined offer to receive copy of patient instructions, educational materials, and care plan.   Telephone follow up appointment with care management team member scheduled for:02/23/21  Eduard Clos MSW, LCSW Licensed Clinical Social Worker [place] [phone]   CLINICAL CARE PLAN: Patient Care Plan: LCSW Plan of Care     Problem Identified: Psychosocial Adjustment to Dementia-legal/safety plan   Priority: High     Goal: Provide pt/family with resources, support and knowledge to optimize pt's safety, legal concerns, etc   Start Date: 02/13/2021  Expected End Date: 03/28/2021  This Visit's Progress: On track  Priority: High  Note:   Current barriers:   Concerns related to memory loss impacting pt's safety and ability to manage; insurance, etc Currently unable to  independently self manage needs related to chronic health conditions.  Knowledge Deficits related to short term plan for care coordination needs and long term plans for chronic disease management needs Clinical Goals: patient will work with CSW, PCP, and other providers  to address needs related to memory loss, care needs, etc Interventions:  CSW spoke with pt by phone- pt was alert, oriented to name, DOB, address and was able  to conversate with CSW. He was not able to discuss the concerns related to his insurance and possible "scamming" as reported by Pharmacist.  Pt gave CSW permission to call pt's son. [name], Randy [name], reports not having much relationship with pt until just recently. Son lives in the [place] area- discussed with son the concerns about his insurance and possible scamming- son is not aware. CSW discussed with son concerns about pt's cognitive impairment and forgetting appointments, etc. CSW strongly encouraged son to go with pt to the PCP visit this week (02/15/21) as well as to assist pt with completion and with Notary the  [place], Advance Directive documents.  Assessment of needs, barriers , as well as how impacting  Review various resources and discussed options  ( POA, Guardianship, etc) Collaboration with PCP regarding development and update of comprehensive plan of care as evidenced by provider attestation and co-signature Inter-disciplinary care team collaboration (see longitudinal plan of care)  Other interventions provided: Solution-Focused Strategies, Active listening / Reflection utilized , [place] and reviewed process , and [place] of Attorney  Patient self-care activities: Over the next 30 days   Call your insurance provider for more information about your Enhanced Benefits  Follow up on insurance options ( [employer] Card, [employer]  and [place]) I have placed a referral to the care guide for community resources, they will follow up with you  Review Guardianship papers, contact DSS at the phone on the front of the packet with questions  Follow Up Plan:   Appointment scheduled for SW follow up with client by phone on: 02/23/21

## 2021-02-15 ENCOUNTER — Ambulatory Visit: Payer: Medicare Other | Admitting: Internal Medicine

## 2021-02-16 NOTE — Telephone Encounter (Signed)
LMOVM for patient to give me a call back. I am also sending the patient a certified letter today. Certified letter sent 02-16-2021.

## 2021-02-23 ENCOUNTER — Ambulatory Visit: Payer: Medicare Other | Admitting: *Deleted

## 2021-02-23 DIAGNOSIS — R413 Other amnesia: Secondary | ICD-10-CM

## 2021-02-23 DIAGNOSIS — E1121 Type 2 diabetes mellitus with diabetic nephropathy: Secondary | ICD-10-CM

## 2021-02-23 NOTE — Patient Instructions (Signed)
Visit Information  PATIENT GOALS:  Goals Addressed               This Visit's Progress     Keeping Myself/Loved One Safe-Dementia (pt-stated)        Timeframe:  Short-Term Goal Priority:  High Start Date:   02/13/21                          Expected End Date:      04/13/21                 Follow Up Date 03/10/21   -discuss concerns related to memory loss with PCP/referral to specialist? -consider completion of legal documents- HCPOA, Durable POA,etc  -consider seeking Guardianship (now or in future) -- use a GPS device in the car    Why is this important?   Safety is important when you or your loved one has dementia.  Eyesight, hearing and changes in feelings of hot and cold or depth-perception may occur.  Wandering or getting lost may happen.  You/your loved one may have to stop driving.  Taking steps to improve safety can prevent injuries.  It will also help you/your loved one feel more relaxed.  It will help you/your loved one be independent for a longer time.     Notes:         The patient verbalized understanding of instructions, educational materials, and care plan provided today and declined offer to receive copy of patient instructions, educational materials, and care plan.   Telephone follow up appointment with care management team member scheduled for:03/10/21  Lonette Stevison MSW, [place] Licensed Clinical Social Worker [place] [phone]

## 2021-02-23 NOTE — Chronic Care Management (AMB) (Signed)
Chronic Care Management    Clinical Social Work Note  02/23/2021 Name: SASCHA BAUGHER MRN: [id number] DOB: [birth year]  Randy [name] is a 75 y.o. year old male who is a primary care patient of Elby Beck, FNP. The CCM team was consulted to assist the patient with chronic disease management and/or care coordination needs related to: [employer]  and cognitive decline .   Engaged with patient by telephone for follow up visit in response to provider referral for social work chronic care management and care coordination services.   Consent to Services:  The patient was given information about Chronic Care Management services, agreed to services, and gave verbal consent prior to initiation of services.  Please see initial visit note for detailed documentation.   Patient agreed to services and consent obtained.   Assessment: Review of patient past medical history, allergies, medications, and health status, including review of relevant consultants reports was performed today as part of a comprehensive evaluation and provision of chronic care management and care coordination services.     SDOH (Social Determinants of Health) assessments and interventions performed:    Advanced Directives Status: See Care Plan for related entries.  CCM Care Plan  No Known Allergies  Outpatient Encounter Medications as of 02/23/2021  Medication Sig   Accu-Chek Softclix Lancets lancets USE AS DIRECTED  TO TEST BLOOD SUGAR ONE TIME DAILY  AND AS NEEDED   Alcohol Swabs (B-D SINGLE USE SWABS REGULAR) PADS Use as instructed to clean area for glucose monitoring once daily and as needed.  Diagnosis:  E11.21  Non insulin-dependent   allopurinol (ZYLOPRIM) 100 MG tablet Take 0.5 tablets (50 mg total) by mouth daily.   apixaban (ELIQUIS) 5 MG TABS tablet Take 1 tablet (5 mg total) by mouth 2 (two) times daily.   atorvastatin (LIPITOR) 80 MG tablet TAKE 1 TABLET (80 MG TOTAL) DAILY AT 6 PM.   Blood  Glucose Calibration (ACCU-CHEK AVIVA) SOLN Use to calibrate blood glucose machine as recommended.  Diagnosis:  E11.21  Non-insulin dependent.   Blood Glucose Monitoring Suppl (ACCU-CHEK GUIDE) w/Device KIT Use to check blood sugar once daily.   carvedilol (COREG) 3.125 MG tablet Take 1 tablet (3.125 mg total) by mouth 2 (two) times daily.   ezetimibe (ZETIA) 10 MG tablet Take 1 tablet (10 mg total) by mouth daily.   finasteride (PROSCAR) 5 MG tablet Take 1 tablet (5 mg total) by mouth daily.   furosemide (LASIX) 20 MG tablet Take 1 tablet (20 mg total) by mouth every morning.   gabapentin (NEURONTIN) 300 MG capsule Take 2 capsules (600 mg total) by mouth 2 (two) times daily.   glipiZIDE (GLUCOTROL XL) 10 MG 24 hr tablet Take 1 tablet (10 mg total) by mouth daily with breakfast.   glucose blood (ACCU-CHEK AVIVA PLUS) test strip Test Blood Sugar 1 time daily: Dx:E11.27   indomethacin (INDOCIN) 25 MG capsule Take 1 capsule (25 mg total) by mouth 3 (three) times daily as needed.   sacubitril-valsartan (ENTRESTO) 24-26 MG TAKE ONE TABLET BY MOUTH EVERY MORNING and TAKE ONE TABLET BY MOUTH EVERY EVENING   tamsulosin (FLOMAX) 0.4 MG CAPS capsule Take 1 capsule (0.4 mg total) by mouth daily.   No facility-administered encounter medications on file as of 02/23/2021.    Patient Active Problem List   Diagnosis Date Noted   Idiopathic gout of multiple sites 10/07/2018   Grief 06/16/2018   Dizziness 06/16/2018   Allergic rhinitis 04/24/2018   S/P CABG x  3 09/02/2017   Leukocytosis 08/27/2017   Visit for well man health check 05/07/2017   COPD (chronic obstructive pulmonary disease) ([place]) 13/24/4010   Chronic systolic CHF (congestive heart failure) ([place]) 10/18/2016   Generalized OA 06/22/2014   CKD (chronic kidney disease) stage 3, GFR 30-59 ml/min (HCC)    Synovial cyst of lumbar spine 07/17/2013    Class: Diagnosis of   Osteoarthritis of left hip 09/17/2012   Automatic implantable  cardioverter-defibrillator in situ - St Jude 27/25/3664   Chronic systolic heart failure ([place]) 05/01/2011   IMPOTENCE OF ORGANIC ORIGIN 02/20/2010   Unspecified vitamin D deficiency 02/14/2010   Type 2 diabetes with nephropathy ([place]) 01/11/2010   Implantable cardioverter-defibrillator (ICD) in situ 05/10/2009   HLD (hyperlipidemia) 02/24/2008   Essential hypertension 02/24/2008   Non-ST elevation (NSTEMI) myocardial infarction ([place]) 02/24/2008   HYPERSOMNIA UNSPECIFIED 02/24/2008    Conditions to be addressed/monitored: Dementia; Family and relationship dysfunction, Limited access to caregiver, Cognitive Deficits, and Lacks knowledge of community resources  Care Plan : LCSW Plan of Care  Updates made by [name], LCSW since 02/23/2021 12:00 AM     Problem: Psychosocial Adjustment to Dementia-legal/safety plan   Priority: High     Long-Range Goal: Provide pt/family with resources, support and knowledge to optimize pt's safety, legal concerns, etc   Start Date: 02/13/2021  Expected End Date: 04/28/2021  This Visit's Progress: On track  Recent Progress: On track  Priority: High  Note:   Current barriers:   Concerns related to memory loss impacting pt's safety and ability to manage; insurance, etc Currently unable to  independently self manage needs related to chronic health conditions.  Knowledge Deficits related to short term plan for care coordination needs and long term plans for chronic disease management needs Clinical Goals: patient will work with CSW, PCP, and other providers  to address needs related to memory loss, care needs, etc Interventions:  CSW spoke with pt's son for follow up from previous call to pt and son. Son reports pt's appointment with new PCP was rescheduled for 03/08/21. Son plans to attend visit with pt.  Encouraged son to discuss concerns, treatment  (including the OTC drug he has started giving his day for memory loss) with PCP.   Son is also  assisting with HCPOA documents- he has printed the copy CSW emailed and will help pt with completion and with Notary the [place], Advance Directive documents.  Assessment of needs, barriers , as well as how impacting  Review various resources and discussed options  ( POA, Guardianship, etc) Collaboration with PCP regarding development and update of comprehensive plan of care as evidenced by provider attestation and co-signature Inter-disciplinary care team collaboration (see longitudinal plan of care)  Other interventions provided: Solution-Focused Strategies, Active listening / Reflection utilized , [place] and reviewed process , and [place] of Attorney  Patient self-care activities: Over the next 30 days Call your insurance provider for more information about your Enhanced Benefits  Follow up on insurance options ( [employer], [employer]  and [place]) I have placed a referral to the care guide for community resources, they will follow up with you  Review Guardianship papers, contact DSS at the phone on the front of the packet with questions  Follow Up Plan:   Appointment scheduled for SW follow up with client by phone on: 03/10/21       Follow Up Plan: Appointment scheduled for SW  to meet with client in provider office on: 03/10/21      Eduard Clos MSW, [place] Licensed Clinical Social Worker [place] [phone]

## 2021-03-03 ENCOUNTER — Telehealth: Payer: Self-pay

## 2021-03-03 NOTE — Chronic Care Management (AMB) (Addendum)
Chronic Care Management Pharmacy Assistant   Name: Randy [name]  MRN: [id number] DOB: 12-Mar-1946   Reason for Encounter: Medication Adherence and Delivery Coordination   Recent office visits:  02/23/21- PCP telephone visit Social Worker-no medication changes 02/13/21- PCP telephone visit Social Worker- no medication changes  Recent consult visits:  02/10/21- Cardiology attempted telephone call for remote ICM transmission  Hospital visits:  None in previous 6 months  Medications: Outpatient Encounter Medications as of 03/03/2021  Medication Sig   Accu-Chek Softclix Lancets lancets USE AS DIRECTED  TO TEST BLOOD SUGAR ONE TIME DAILY  AND AS NEEDED   Alcohol Swabs (B-D SINGLE USE SWABS REGULAR) PADS Use as instructed to clean area for glucose monitoring once daily and as needed.  Diagnosis:  E11.21  Non insulin-dependent   allopurinol (ZYLOPRIM) 100 MG tablet Take 0.5 tablets (50 mg total) by mouth daily.   apixaban (ELIQUIS) 5 MG TABS tablet Take 1 tablet (5 mg total) by mouth 2 (two) times daily.   atorvastatin (LIPITOR) 80 MG tablet TAKE 1 TABLET (80 MG TOTAL) DAILY AT 6 PM.   Blood Glucose Calibration (ACCU-CHEK AVIVA) SOLN Use to calibrate blood glucose machine as recommended.  Diagnosis:  E11.21  Non-insulin dependent.   Blood Glucose Monitoring Suppl (ACCU-CHEK GUIDE) w/Device KIT Use to check blood sugar once daily.   carvedilol (COREG) 3.125 MG tablet Take 1 tablet (3.125 mg total) by mouth 2 (two) times daily.   ezetimibe (ZETIA) 10 MG tablet Take 1 tablet (10 mg total) by mouth daily.   finasteride (PROSCAR) 5 MG tablet Take 1 tablet (5 mg total) by mouth daily.   furosemide (LASIX) 20 MG tablet Take 1 tablet (20 mg total) by mouth every morning.   gabapentin (NEURONTIN) 300 MG capsule Take 2 capsules (600 mg total) by mouth 2 (two) times daily.   glipiZIDE (GLUCOTROL XL) 10 MG 24 hr tablet Take 1 tablet (10 mg total) by mouth daily with breakfast.   glucose blood  (ACCU-CHEK AVIVA PLUS) test strip Test Blood Sugar 1 time daily: Dx:E11.27   indomethacin (INDOCIN) 25 MG capsule Take 1 capsule (25 mg total) by mouth 3 (three) times daily as needed.   sacubitril-valsartan (ENTRESTO) 24-26 MG TAKE ONE TABLET BY MOUTH EVERY MORNING and TAKE ONE TABLET BY MOUTH EVERY EVENING   tamsulosin (FLOMAX) 0.4 MG CAPS capsule Take 1 capsule (0.4 mg total) by mouth daily.   No facility-administered encounter medications on file as of 03/03/2021.    BP Readings from Last 3 Encounters:  05/30/20 116/62  01/06/20 (!) 94/58  11/20/19 (!) 100/58    Lab Results  Component Value Date   HGBA1C 7.9 (H) 05/30/2020     Last adherence delivery date: 02/16/2021      Patient is due for next adherence delivery on: 03/17/2021  Spoke with patient on 03/03/21 reviewed medications and coordinated delivery.  This delivery to include: Adherence Packaging  30 Days  Packs: Tamsulosin 0.28m - 1 daily (evening meal) Atorvastatin 80 mg - 1 daily (breakfast) Glipizide ER 10 mg - 1 daily  (breakfast) Finasteride 5 mg - 1 daily (breakfast) Allopurinol 100 mg - 1/2 tablet daily (breakfast) Zetia 10 mg- 1 tablet daily (breakfast)  Furosemide 20 mg - 1 daily (breakfast) Gabapentin 300 mg - 2 BID (breakfast and evening meal) Carvedilol 3.125 mg - 1 BID (breakfast and evening meal)  Patient declined the following medications this month: Entresto 24-26 mg - 1 tablet BID (Pt approved for patient assistance. He  has not received it yet as of 03/03/21. He has not answered the phone to schedule the delivery.)  No blood pressure or blood sugar readings available this time   Refills requested from PCP include: Gabapentin and carvedilol requested from PCP.  Confirmed delivery date of 03/17/2021, advised patient that pharmacy will contact them the morning of delivery.  Debbora Dus, CPP notified  [name], [place] Assistant [phone]  I have reviewed the care management  and care coordination activities outlined in this encounter and I am certifying that I agree with the content of this note. Reviewed above, patient should be in packs, not vials. Updated this. Also requested refills from PCP.   Debbora Dus, PharmD Clinical Pharmacist [facility] at [facility] [phone]

## 2021-03-08 ENCOUNTER — Ambulatory Visit: Payer: Medicare Other | Admitting: Internal Medicine

## 2021-03-10 ENCOUNTER — Other Ambulatory Visit: Payer: Self-pay

## 2021-03-10 ENCOUNTER — Ambulatory Visit: Payer: Medicare PPO | Admitting: Nurse Practitioner

## 2021-03-10 ENCOUNTER — Other Ambulatory Visit: Payer: Self-pay | Admitting: Nurse Practitioner

## 2021-03-10 ENCOUNTER — Encounter: Payer: Self-pay | Admitting: Nurse Practitioner

## 2021-03-10 ENCOUNTER — Telehealth: Payer: Self-pay

## 2021-03-10 VITALS — BP 108/56 | HR 78 | Temp 98.4°F | Resp 14 | Ht 66.75 in | Wt 182.5 lb

## 2021-03-10 DIAGNOSIS — I10 Essential (primary) hypertension: Secondary | ICD-10-CM

## 2021-03-10 DIAGNOSIS — I5022 Chronic systolic (congestive) heart failure: Secondary | ICD-10-CM | POA: Diagnosis not present

## 2021-03-10 DIAGNOSIS — E782 Mixed hyperlipidemia: Secondary | ICD-10-CM | POA: Diagnosis not present

## 2021-03-10 DIAGNOSIS — M1A09X1 Idiopathic chronic gout, multiple sites, with tophus (tophi): Secondary | ICD-10-CM

## 2021-03-10 DIAGNOSIS — M79671 Pain in right foot: Secondary | ICD-10-CM

## 2021-03-10 DIAGNOSIS — E1121 Type 2 diabetes mellitus with diabetic nephropathy: Secondary | ICD-10-CM | POA: Diagnosis not present

## 2021-03-10 DIAGNOSIS — G3184 Mild cognitive impairment, so stated: Secondary | ICD-10-CM | POA: Insufficient documentation

## 2021-03-10 DIAGNOSIS — E559 Vitamin D deficiency, unspecified: Secondary | ICD-10-CM

## 2021-03-10 DIAGNOSIS — Z95810 Presence of automatic (implantable) cardiac defibrillator: Secondary | ICD-10-CM

## 2021-03-10 DIAGNOSIS — R413 Other amnesia: Secondary | ICD-10-CM

## 2021-03-10 DIAGNOSIS — R2241 Localized swelling, mass and lump, right lower limb: Secondary | ICD-10-CM

## 2021-03-10 LAB — LIPID PANEL
Cholesterol: 197 mg/dL (ref 0–200)
HDL: 44.6 mg/dL (ref >39.00)
NonHDL: 152.87
Total CHOL/HDL Ratio: 4
Triglycerides: 244 mg/dL — ABNORMAL HIGH (ref 0.0–149.0)
VLDL: 48.8 mg/dL — ABNORMAL HIGH (ref 0.0–40.0)

## 2021-03-10 LAB — COMPREHENSIVE METABOLIC PANEL
ALT: 19 U/L (ref 0–53)
AST: 22 U/L (ref 0–37)
Albumin: 4.4 g/dL (ref 3.5–5.2)
Alkaline Phosphatase: 62 U/L (ref 39–117)
BUN: 32 mg/dL — ABNORMAL HIGH (ref 6–23)
CO2: 28 mEq/L (ref 19–32)
Calcium: 9.9 mg/dL (ref 8.4–10.5)
Chloride: 98 mEq/L (ref 96–112)
Creatinine, Ser: 1.86 mg/dL — ABNORMAL HIGH (ref 0.40–1.50)
GFR: 35 mL/min — ABNORMAL LOW (ref >60.00)
Glucose, Bld: 231 mg/dL — ABNORMAL HIGH (ref 70–99)
Potassium: 3.9 mEq/L (ref 3.5–5.1)
Sodium: 138 mEq/L (ref 135–145)
Total Bilirubin: 0.6 mg/dL (ref 0.2–1.2)
Total Protein: 7.1 g/dL (ref 6.0–8.3)

## 2021-03-10 LAB — HEMOGLOBIN A1C: Hgb A1c MFr Bld: 8.9 % — ABNORMAL HIGH (ref 4.6–6.5)

## 2021-03-10 LAB — VITAMIN B12: Vitamin B-12: 199 pg/mL — ABNORMAL LOW (ref 211–911)

## 2021-03-10 LAB — URIC ACID: Uric Acid, Serum: 7.9 mg/dL — ABNORMAL HIGH (ref 4.0–7.8)

## 2021-03-10 LAB — LDL CHOLESTEROL, DIRECT: Direct LDL: 116 mg/dL

## 2021-03-10 LAB — VITAMIN D 25 HYDROXY (VIT D DEFICIENCY, FRACTURES): VITD: 31.61 ng/mL (ref 30.00–100.00)

## 2021-03-10 LAB — TSH: TSH: 1.45 u[IU]/mL (ref 0.35–5.50)

## 2021-03-10 MED ORDER — CARVEDILOL 3.125 MG PO TABS: 3.1250 mg | ORAL_TABLET | Freq: Two times a day (BID) | ORAL | 2 refills | Status: DC

## 2021-03-10 MED ORDER — GABAPENTIN 300 MG PO CAPS: 600.0000 mg | ORAL_CAPSULE | Freq: Two times a day (BID) | ORAL | 2 refills | Status: DC

## 2021-03-10 NOTE — Assessment & Plan Note (Signed)
Followed by Dr. Cristopher Peru.

## 2021-03-10 NOTE — Assessment & Plan Note (Signed)
Maintained on atorvastatin '80mg'$ . History of ASCVD. Continue to take atorvastatin '80mg'$  and check lipid panel toady. Pending results

## 2021-03-10 NOTE — Addendum Note (Signed)
Addended by: Ellamae Sia on: 03/10/2021 10:06 AM   Modules accepted: Orders

## 2021-03-10 NOTE — Assessment & Plan Note (Signed)
Followed by Dr. Cristopher Peru. Unsure when the last time that he has been evaluated by him. On Entresto. Continue entresto. Follow up as scheduled with Dr. Cristopher Peru

## 2021-03-10 NOTE — Assessment & Plan Note (Signed)
Maintained on carvedilol. Patient is poor historian and lives alone. Does not check blood pressure regularly. Continue carvedilol. Encouraged to check BP at home

## 2021-03-10 NOTE — Addendum Note (Signed)
Addended by: Ellamae Sia on: 03/10/2021 10:25 AM   Modules accepted: Orders

## 2021-03-10 NOTE — Assessment & Plan Note (Signed)
Has been there for years per patient report.  Size of large golf ball.  Does not hurt patient no true complaints.  Will refer to podiatry for further evaluation.

## 2021-03-10 NOTE — Assessment & Plan Note (Signed)
Takes glipizide. Does not check his glucose at home. Will recheck A1C in lab today. Pending results

## 2021-03-10 NOTE — Telephone Encounter (Signed)
Patient gets pill packs from [facility]. He needs a refill on carvedilol 3.125 mg and gabapentin 300 mg.  Thanks!  Debbora Dus, PharmD Clinical Pharmacist [facility] [phone]

## 2021-03-10 NOTE — Progress Notes (Signed)
Established Patient Office Visit  Subjective:  Patient ID: Randy [name], male    DOB: 1946-07-20  Age: 75 y.o. MRN: [id number]  CC:  Chief Complaint  Patient presents with   Transfer of Care    From [facility]   Medication Problem    Not sure which medications he is taking    HPI Randy [name] presents for   MC:NOBS not check sugar at home. Does take glipizide. He denies any episodes of hypoglycemia.  HTN: Does not take his bp at home. States that he has the ability but doesn't. No particular diet.  HF: Dr Crissie Sickles. Has ICD. Does not check the ICD at home. Does endorse DOE, denies edema.   Past Medical History:  Diagnosis Date   AICD (automatic cardioverter/defibrillator) present 10/18/2016   "replaced 2009-[employer] device, Dr Lovena Le"   Anemia    Arthritis    "back" (08/27/2017)   CAD (coronary artery disease)    CHF (congestive heart failure) (HCC)    CKD (chronic kidney disease) stage 3, GFR 30-59 ml/min (HCC)    [name]   Diverticulosis    Dyslipidemia    GERD (gastroesophageal reflux disease)    Hypertension    Neuromuscular disorder (HCC)    neuropathy- in hands   Osteoarthritis    hips, hands   Pacemaker    Shortness of breath    Sleep apnea    started a sleep study but didn't completed, states PCP told him he has sleep  apnea, doesn't use CPAP   Systolic dysfunction    Type 2 diabetes with nephropathy (HCC)    Vitamin D deficiency     Past Surgical History:  Procedure Laterality Date   ANTERIOR CERVICAL DECOMP/DISCECTOMY FUSION     BACK SURGERY     CARPAL TUNNEL RELEASE Left    CORONARY ARTERY BYPASS GRAFT N/A 09/02/2017   Procedure: CORONARY ARTERY BYPASS GRAFTING (CABG) x three , using left internal mammary artery and right leg greater saphenous vein harvested endoscopically;  Surgeon: [name], MD;  Location: [facility];  Service: Open Heart Surgery;  Laterality: N/A;   CYST EXCISION     "back"   ICD IMPLANT N/A 10/18/2016    Procedure: ICD Implant- Upgrade to Dual Chamber;  Surgeon: Evans Lance, MD;  Location: [facility];  Service: Cardiovascular;  Laterality: N/A;   INSERT / REPLACE / REMOVE PACEMAKER     JOINT REPLACEMENT     LEFT HEART CATH AND CORONARY ANGIOGRAPHY N/A 08/30/2017   Procedure: LEFT HEART CATH AND CORONARY ANGIOGRAPHY;  Surgeon: Martinique, Peter M, MD;  Location: [facility];  Service: Cardiovascular;  Laterality: N/A;   LUMBAR LAMINECTOMY Left 07/17/2013   Procedure: MICRODISCECTOMY LUMBAR LAMINECTOMY;  Surgeon: Marybelle Killings, MD;  Location: [facility];  Service: Orthopedics;  Laterality: Left;  Left L4 Laminotomy, Lateral Recess Decompression, Cyst Excision   SHOULDER OPEN ROTATOR CUFF REPAIR Left    TEE WITHOUT CARDIOVERSION N/A 09/02/2017   Procedure: TRANSESOPHAGEAL ECHOCARDIOGRAM (TEE);  Surgeon: [name], MD;  Location: [facility];  Service: Open Heart Surgery;  Laterality: N/A;   TOTAL HIP ARTHROPLASTY Left 09/17/2012   Procedure: Left TOTAL HIP ARTHROPLASTY ANTERIOR APPROACH;  Surgeon: Marybelle Killings, MD;  Location: [facility];  Service: Orthopedics;  Laterality: Left;    Family History  Problem Relation Age of Onset   Heart attack Father 35   Diabetes Brother     Social History   Socioeconomic  History   Marital status: Divorced    Spouse name: Not on file   Number of children: 2   Years of education: Not on file   Highest education level: Not on file  Occupational History   Occupation: retired    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   Desires CPR, does want life support if not futile. 05/30/2020- discussed, patient would want CPR/ intubation. Does not have HCPOA/ living will      Social Determinants of Health   Financial Resource Strain: Not on file  Food Insecurity: Not on file  Transportation Needs: Not on file  Physical Activity: Not on file  Stress: Not on file  Social Connections: Not on file  Intimate Partner Violence: Not on file    Outpatient Medications Prior to Visit  Medication Sig Dispense Refill   Accu-Chek Softclix Lancets lancets USE AS DIRECTED  TO TEST BLOOD SUGAR ONE TIME DAILY  AND AS NEEDED 200 each 2   Alcohol Swabs (B-D SINGLE USE SWABS REGULAR) PADS Use as instructed to clean area for glucose monitoring once daily and as needed.  Diagnosis:  E11.21  Non insulin-dependent 100 each 3   allopurinol (ZYLOPRIM) 100 MG tablet Take 0.5 tablets (50 mg total) by mouth daily. 45 tablet 0   apixaban (ELIQUIS) 5 MG TABS tablet Take 1 tablet (5 mg total) by mouth 2 (two) times daily. 60 tablet 11   atorvastatin (LIPITOR) 80 MG tablet TAKE 1 TABLET (80 MG TOTAL) DAILY AT 6 PM. 90 tablet 1   Blood Glucose Calibration (ACCU-CHEK AVIVA) SOLN Use to calibrate blood glucose machine as recommended.  Diagnosis:  E11.21  Non-insulin dependent. 1 each 2   Blood Glucose Monitoring Suppl (ACCU-CHEK GUIDE) w/Device KIT Use to check blood sugar once daily. 1 kit 0   carvedilol (COREG) 3.125 MG tablet Take 1 tablet (3.125 mg total) by mouth 2 (two) times daily. 60 tablet 0   ezetimibe (ZETIA) 10 MG tablet Take 1 tablet (10 mg total) by mouth daily. 90 tablet 3   furosemide (LASIX) 20 MG tablet Take 1 tablet (20 mg total) by mouth every morning. 90 tablet 0   gabapentin (NEURONTIN) 300 MG capsule Take 2 capsules (600 mg total) by mouth 2 (two) times daily. 120 capsule 0   glipiZIDE (GLUCOTROL XL) 10 MG 24 hr tablet Take 1 tablet (10 mg total)  by mouth daily with breakfast. 90 tablet 0   glucose blood (ACCU-CHEK AVIVA PLUS) test strip Test Blood Sugar 1 time daily: Dx:E11.27 100 each 3   finasteride (PROSCAR) 5 MG tablet Take 1 tablet (5 mg total) by mouth daily. 90 tablet 0   indomethacin (INDOCIN) 25 MG capsule Take 1 capsule (25 mg total) by mouth 3 (three) times daily as needed. 21 capsule 1  sacubitril-valsartan (ENTRESTO) 24-26 MG TAKE ONE TABLET BY MOUTH EVERY MORNING and TAKE ONE TABLET BY MOUTH EVERY EVENING 180 tablet 0   tamsulosin (FLOMAX) 0.4 MG CAPS capsule Take 1 capsule (0.4 mg total) by mouth daily. 90 capsule 0   No facility-administered medications prior to visit.    No Known Allergies  ROS Review of Systems  Constitutional:  Negative for chills, fever and unexpected weight change.  Respiratory:  Positive for shortness of breath.   Cardiovascular:  Negative for chest pain, palpitations and leg swelling.  Gastrointestinal:  Negative for constipation, diarrhea, nausea and vomiting.  Skin:  Positive for wound. Negative for color change and pallor.  Hematological:  Bruises/bleeds easily.  Psychiatric/Behavioral:         Short term memory issues      Objective:    Physical Exam Vitals and nursing note reviewed.  Constitutional:      Appearance: Normal appearance.  HENT:     Right Ear: Tympanic membrane, ear canal and external ear normal. There is no impacted cerumen.     Left Ear: Tympanic membrane, ear canal and external ear normal. There is no impacted cerumen.     Mouth/Throat:     Mouth: Mucous membranes are moist.  Eyes:     Extraocular Movements: Extraocular movements intact.  Neck:     Vascular: No carotid bruit.  Cardiovascular:     Rate and Rhythm: Normal rate and regular rhythm.     Pulses:          Dorsalis pedis pulses are 2+ on the right side and 2+ on the left side.     Heart sounds: Normal heart sounds. No murmur heard. Pulmonary:     Effort: Pulmonary effort is normal.      Breath sounds: Decreased air movement present. Examination of the right-lower field reveals decreased breath sounds. Examination of the left-lower field reveals decreased breath sounds. Decreased breath sounds present.  Abdominal:     General: Bowel sounds are normal.  Musculoskeletal:        General: Normal range of motion.     Right lower leg: No edema.     Left lower leg: No edema.       Feet:  Feet:     Comments: Poor skin care to bilateral feet. Has hard, dried, callus like skin to bilateral dorsal toes. Lymphadenopathy:     Cervical: No cervical adenopathy.  Skin:    General: Skin is warm.  Neurological:     Mental Status: He is alert.     Motor: No weakness.     Coordination: Coordination normal. Finger-Nose-Finger Test normal.     Gait: Gait normal.    BP (!) 108/56   Pulse 78   Temp 98.4 F (36.9 C)   Resp 14   Ht 5' 6.75" (1.695 m)   Wt 182 lb 8 oz (82.8 kg)   SpO2 93%   BMI 28.80 kg/m  Wt Readings from Last 3 Encounters:  03/10/21 182 lb 8 oz (82.8 kg)  05/30/20 182 lb (82.6 kg)  01/06/20 184 lb (83.5 kg)     Health Maintenance Due  Topic Date Due   COVID-19 Vaccine (1) Never done   TETANUS/TDAP  01/13/2018   Zoster Vaccines- Shingrix (2 of 2) 09/03/2019   COLONOSCOPY (Pts 45-33yr Insurance coverage will need to be confirmed)  02/07/2020   OPHTHALMOLOGY EXAM  07/13/2020   FOOT EXAM  11/19/2020   HEMOGLOBIN A1C  11/27/2020   INFLUENZA VACCINE  02/27/2021    There are no preventive care reminders to display for this patient.  Lab Results  Component Value Date   TSH 0.53 06/16/2018   Lab Results  Component Value Date   WBC 7.8 05/30/2020   HGB 11.1 (L) 05/30/2020   HCT 33.4 (L) 05/30/2020   MCV 84.8 05/30/2020   PLT 176.0 05/30/2020   Lab Results  Component Value Date   NA 140 05/30/2020   K 4.4 05/30/2020   CO2 26 05/30/2020   GLUCOSE 131 (H) 05/30/2020   BUN 26 (H) 05/30/2020   CREATININE 1.63 (H) 05/30/2020   BILITOT 0.8 05/30/2020    ALKPHOS 53 05/30/2020   AST 12 05/30/2020   ALT 6 05/30/2020   PROT 6.8 05/30/2020   ALBUMIN 4.1 05/30/2020   CALCIUM 9.1 05/30/2020   ANIONGAP 10 09/07/2017   GFR 41.24 (L) 05/30/2020   Lab Results  Component Value Date   CHOL 219 (H) 05/30/2020   Lab Results  Component Value Date   HDL 38.50 (L) 05/30/2020   Lab Results  Component Value Date   LDLCALC 145 (H) 05/30/2020   Lab Results  Component Value Date   TRIG 178.0 (H) 05/30/2020   Lab Results  Component Value Date   CHOLHDL 6 05/30/2020   Lab Results  Component Value Date   HGBA1C 7.9 (H) 05/30/2020      Assessment & Plan:   Problem List Items Addressed This Visit       Cardiovascular and Mediastinum   Essential hypertension - Primary    Maintained on carvedilol. Patient is poor historian and lives alone. Does not check blood pressure regularly. Continue carvedilol. Encouraged to check BP at home      Chronic systolic heart failure ([place])    Followed by Dr. Cristopher Peru. Unsure when the last time that he has been evaluated by him. On Entresto. Continue entresto. Follow up as scheduled with Dr. Cristopher Peru      Relevant Orders   CBC     Endocrine   Type 2 diabetes with nephropathy ([place])    Takes glipizide. Does not check his glucose at home. Will recheck A1C in lab today. Pending results      Relevant Orders   Comprehensive metabolic panel   Hemoglobin A1c     Other   Vitamin D deficiency    Check vitamin D level today in office.       HLD (hyperlipidemia)    Maintained on atorvastatin 76m. History of ASCVD. Continue to take atorvastatin 817mand check lipid panel toady. Pending results      Relevant Orders   Lipid panel   Implantable cardioverter-defibrillator (ICD) in situ    Followed by Dr. GrCristopher Peru     Idiopathic gout of multiple sites    Currently maintained on allopuronol.  Last uric acid in office slightly elevated.  No complaints from patient.  We will redraw uric  acid level today.  Pending lab results      Relevant Orders   Uric acid   Memory dysfunction    Son is present (JClair Gullingand is concerned for patient's short-term memory.  States sometimes he gets 15-20 calls while at work from his father regarding something sometimes they have already talked about.  States this has been going on for at least a year.  Patient does live alone/independently.  When asked patient denies any type of hallucinations.  We will check labs for metabolic reason.  Neuro exam intact.  Pending lab results we will have patient follow-up for Mini-Mental exam.      Relevant Orders   VITAMIN D 25 Hydroxy (Vit-D Deficiency, Fractures)   TSH   Vitamin B12   Mass of right foot    Has been there for years per patient report.  Size of large golf ball.  Does not hurt patient no true complaints.  Will refer to podiatry for further evaluation.      Relevant Orders   Ambulatory referral to Podiatry    No orders of the defined types were placed in this encounter.   Follow-up: Return in about 1 month (around 04/10/2021) for Mini mental exam.   This visit occurred during the SARS-CoV-2 public health emergency.  Safety protocols were in place, including screening questions prior to the visit, additional usage of staff PPE, and extensive cleaning of exam room while observing appropriate contact time as indicated for disinfecting solutions.   Romilda Garret, NP

## 2021-03-10 NOTE — Assessment & Plan Note (Signed)
Son is present Randy [name]) and is concerned for patient's short-term memory.  States sometimes he gets 15-20 calls while at work from his father regarding something sometimes they have already talked about.  States this has been going on for at least a year.  Patient does live alone/independently.  When asked patient denies any type of hallucinations.  We will check labs for metabolic reason.  Neuro exam intact.  Pending lab results we will have patient follow-up for Mini-Mental exam.

## 2021-03-10 NOTE — Assessment & Plan Note (Addendum)
Currently maintained on allopuronol.  Last uric acid in office slightly elevated.  No complaints from patient.  We will redraw uric acid level today.  Pending lab results

## 2021-03-10 NOTE — Patient Instructions (Signed)
Will contact you with lab results Want to see you in about a month for the memory test Follow up sooner if needed

## 2021-03-10 NOTE — Assessment & Plan Note (Signed)
Check vitamin D level today in office.

## 2021-03-13 ENCOUNTER — Ambulatory Visit (INDEPENDENT_AMBULATORY_CARE_PROVIDER_SITE_OTHER): Payer: Medicare PPO

## 2021-03-13 DIAGNOSIS — I5022 Chronic systolic (congestive) heart failure: Secondary | ICD-10-CM | POA: Diagnosis not present

## 2021-03-13 DIAGNOSIS — Z95810 Presence of automatic (implantable) cardiac defibrillator: Secondary | ICD-10-CM

## 2021-03-14 ENCOUNTER — Telehealth: Payer: Self-pay

## 2021-03-14 NOTE — Telephone Encounter (Signed)
Spoke with patient today about his medications and verified what he had at home. List is below. Patient could not tell me if he has been taking these medications.  I asked patient if he would be willing to have [place] ordered and have nurse help him with medication management and making sure he takes it appropriately and patient agreed. The only issue is that [place] can not be ordered just for Nursing, it has to be with PT or OT. Not sure if patient meets this criteria.  Patient does not have another appointment until 04/14/21.  Patient bottles at home as follows: Furosemide 20 mg-2 bottles, 40 mg-1 bottle Atorvastatin 80 mg-2 bottles. The rest are 1 bottle of each-Carvedilol 3.125, Zetia 10, Finasteride 6 mg, Glipizide 10 mg, Amiodarone 200 mg (not on our list), Tamsulosin 0.4.  Did not have: Eliquis, Gabapentin, Indomethacin, Entresto

## 2021-03-15 ENCOUNTER — Other Ambulatory Visit: Payer: Medicare PPO

## 2021-03-15 ENCOUNTER — Ambulatory Visit: Payer: Medicare PPO | Admitting: Podiatry

## 2021-03-15 NOTE — Progress Notes (Signed)
EPIC Encounter for ICM Monitoring  Patient Name: Randy [name] is a 75 y.o. male Date: 03/15/2021 Primary Care Physican: Michela Pitcher, NP Primary Cardiologist: Lovena Le Electrophysiologist: Lovena Le Last Weight: 180 lbs   AT/AF Burden: <1% (taking Aspirin)   Transmission reviewed.    [place] thoracic impedance suggesting normal fluid levels.   Prescribed: Furosemide 20 mg 1 tablet daily   Labs: 03/10/2021 Creatinine 1.86, BUN 32, Potassium 3.9, Sodium 138, GFR 35 A complete set of results can be found in Results Review.   Recommendations: No changes.   Follow-up plan: ICM clinic phone appointment on 04/17/2021.  91 day device clinic remote transmission scheduled for 04/04/2021.    EP/Cardiology Office Visits: Recall 01/02/2021 with Dr. Lovena Le.     Copy of ICM check sent to Dr. Lovena Le.    3 month ICM trend: 03/13/2021.    1 Year ICM trend:       Rosalene Billings, RN 03/15/2021 10:48 AM

## 2021-03-30 ENCOUNTER — Telehealth: Payer: Self-pay

## 2021-03-30 NOTE — Telephone Encounter (Signed)
Called patient and let him know he needs to schedule follow up office visit. I gave him number to office so he could call back when not busy and schedule.

## 2021-04-06 ENCOUNTER — Other Ambulatory Visit: Payer: Self-pay | Admitting: Family

## 2021-04-06 DIAGNOSIS — E790 Hyperuricemia without signs of inflammatory arthritis and tophaceous disease: Secondary | ICD-10-CM

## 2021-04-07 ENCOUNTER — Telehealth: Payer: Self-pay

## 2021-04-07 NOTE — Chronic Care Management (AMB) (Addendum)
Chronic Care Management Pharmacy Assistant   Name: Randy [name]  MRN: [id number] DOB: [birth year]  Reason for Encounter: Medication Adherence and Delivery Coordination    Recent office visits:  03/10/21- Family Medicine-Juvon [name] NP-Patient presented for new patient,transfer of care. Labs orderded-new A1C 8.9. His vitamin B12 is low. He can get vitamin B12 1069mg OTC and start taking one a day. His cholesterol is still elevated. I want to make sure that he is still taking the atorvastatin 838mand the ezetimibe 1065mvery day. His A1C is also elevated. Need to make sure that he is taking the glipizide 50m66mblet every day also.    Recent consult visits:  None since last CCM contact  Hospital visits:  None in previous 6 months  Medications: Outpatient Encounter Medications as of 04/07/2021  Medication Sig   Accu-Chek Softclix Lancets lancets USE AS DIRECTED  TO TEST BLOOD SUGAR ONE TIME DAILY  AND AS NEEDED   Alcohol Swabs (B-D SINGLE USE SWABS REGULAR) PADS Use as instructed to clean area for glucose monitoring once daily and as needed.  Diagnosis:  E11.21  Non insulin-dependent   allopurinol (ZYLOPRIM) 100 MG tablet Take 0.5 tablets (50 mg total) by mouth daily.   apixaban (ELIQUIS) 5 MG TABS tablet Take 1 tablet (5 mg total) by mouth 2 (two) times daily.   atorvastatin (LIPITOR) 80 MG tablet TAKE 1 TABLET (80 MG TOTAL) DAILY AT 6 PM.   Blood Glucose Calibration (ACCU-CHEK AVIVA) SOLN Use to calibrate blood glucose machine as recommended.  Diagnosis:  E11.21  Non-insulin dependent.   Blood Glucose Monitoring Suppl (ACCU-CHEK GUIDE) w/Device KIT Use to check blood sugar once daily.   carvedilol (COREG) 3.125 MG tablet Take 1 tablet (3.125 mg total) by mouth 2 (two) times daily.   ezetimibe (ZETIA) 10 MG tablet Take 1 tablet (10 mg total) by mouth daily.   finasteride (PROSCAR) 5 MG tablet Take 1 tablet (5 mg total) by mouth daily.   furosemide (LASIX) 20 MG tablet Take 1 tablet  (20 mg total) by mouth every morning.   gabapentin (NEURONTIN) 300 MG capsule Take 2 capsules (600 mg total) by mouth 2 (two) times daily.   glipiZIDE (GLUCOTROL XL) 10 MG 24 hr tablet Take 1 tablet (10 mg total) by mouth daily with breakfast.   glucose blood (ACCU-CHEK AVIVA PLUS) test strip Test Blood Sugar 1 time daily: Dx:E11.27   indomethacin (INDOCIN) 25 MG capsule Take 1 capsule (25 mg total) by mouth 3 (three) times daily as needed.   sacubitril-valsartan (ENTRESTO) 24-26 MG TAKE ONE TABLET BY MOUTH EVERY MORNING and TAKE ONE TABLET BY MOUTH EVERY EVENING   tamsulosin (FLOMAX) 0.4 MG CAPS capsule Take 1 capsule (0.4 mg total) by mouth daily.   No facility-administered encounter medications on file as of 04/07/2021.   BP Readings from Last 3 Encounters:  03/10/21 (!) 108/56  05/30/20 116/62  01/06/20 (!) 94/58    Lab Results  Component Value Date   HGBA1C 8.9 (H) 03/10/2021     Last adherence delivery date:03/17/21      Patient is due for next adherence delivery on: 04/17/21  Spoke with patient on 04/07/2021 reviewed medications and coordinated delivery.    This delivery to include: Packs, 30 day supply Vials: Tamsulosin 0.4mg 60m daily (evening meal) Atorvastatin 80 mg - 1 daily (breakfast) Glipizide ER 10 mg - 1 daily  (breakfast) Finasteride 5 mg - 1 daily (breakfast) Allopurinol 100 mg - 1/2 tablet daily (breakfast)  Zetia 10 mg- 1 tablet daily (breakfast)  Furosemide 20 mg - 1 daily (breakfast) Gabapentin 300 mg - 2 BID (breakfast and evening meal) Carvedilol 3.125 mg - 1 BID (breakfast and evening meal)   Patient declined the following medications this month: Entresto 24-55m take one tablet morning 1 tablet evening - Patient assistance program, 04/07/21 the patient has 90 day supply on hand  Any concerns about your medications? No  How often do you forget or accidentally miss a dose? Rarely- due to insurance issues, there was a gap in some medication refills.  Do you  use a pillbox? No, uses pill packs   Refills requested from providers include: Furosemide and allopurinol  Confirmed delivery date of 04/17/21, advised patient that pharmacy will contact them the morning of delivery.  Annual wellness visit in last year? No Most Recent BP reading: 108/56  78-P  (03/10/21)  Most recent A1C reading: 8.9  (03/10/21) Last eye exam / retinopathy screening:07/14/2019 Last diabetic foot exam: 2019  MDebbora Dus CPP notified  VAvel Sensor CEarl ParkAssistant [phone] I have reviewed the care management and care coordination activities outlined in this encounter and I am certifying that I agree with the content of this note. No further action required.  MDebbora Dus PharmD Clinical Pharmacist [phone]

## 2021-04-14 ENCOUNTER — Ambulatory Visit: Payer: Medicare PPO | Admitting: Nurse Practitioner

## 2021-04-17 ENCOUNTER — Other Ambulatory Visit: Payer: Self-pay

## 2021-04-17 MED ORDER — TAMSULOSIN HCL 0.4 MG PO CAPS: 0.4000 mg | ORAL_CAPSULE | Freq: Every day | ORAL | 5 refills | Status: DC

## 2021-04-17 MED ORDER — FINASTERIDE 5 MG PO TABS: 5.0000 mg | ORAL_TABLET | Freq: Every day | ORAL | 5 refills | Status: DC

## 2021-04-19 ENCOUNTER — Other Ambulatory Visit: Payer: Self-pay

## 2021-04-19 ENCOUNTER — Ambulatory Visit (INDEPENDENT_AMBULATORY_CARE_PROVIDER_SITE_OTHER): Payer: Medicare PPO | Admitting: *Deleted

## 2021-04-19 DIAGNOSIS — E1121 Type 2 diabetes mellitus with diabetic nephropathy: Secondary | ICD-10-CM

## 2021-04-19 DIAGNOSIS — R413 Other amnesia: Secondary | ICD-10-CM

## 2021-04-19 DIAGNOSIS — I5022 Chronic systolic (congestive) heart failure: Secondary | ICD-10-CM | POA: Diagnosis not present

## 2021-04-19 NOTE — Chronic Care Management (AMB) (Deleted)
Chronic Care Management    Clinical Social Work Note  04/19/2021 Name: Randy [name] MRN: [id number] DOB: 1946/07/30  Randy [name] is a 75 y.o. year old male who is a primary care patient of Carlee, Tesfaye, NP. The CCM team was consulted to assist the patient with chronic disease management and/or care coordination needs related to: [employer] .   Engaged with patient by telephone for follow up visit in response to provider referral for social work chronic care management and care coordination services.   Consent to Services:  The patient was given information about Chronic Care Management services, agreed to services, and gave verbal consent prior to initiation of services.  Please see initial visit note for detailed documentation.   Patient agreed to services and consent obtained.   Assessment: Review of patient past medical history, allergies, medications, and health status, including review of relevant consultants reports was performed today as part of a comprehensive evaluation and provision of chronic care management and care coordination services.     SDOH (Social Determinants of Health) assessments and interventions performed:    Advanced Directives Status: Not addressed in this encounter.  CCM Care Plan  No Known Allergies  Outpatient Encounter Medications as of 04/19/2021  Medication Sig   Accu-Chek Softclix Lancets lancets USE AS DIRECTED  TO TEST BLOOD SUGAR ONE TIME DAILY  AND AS NEEDED   Alcohol Swabs (B-D SINGLE USE SWABS REGULAR) PADS Use as instructed to clean area for glucose monitoring once daily and as needed.  Diagnosis:  E11.21  Non insulin-dependent   allopurinol (ZYLOPRIM) 100 MG tablet Take 0.5 tablets (50 mg total) by mouth daily.   apixaban (ELIQUIS) 5 MG TABS tablet Take 1 tablet (5 mg total) by mouth 2 (two) times daily.   atorvastatin (LIPITOR) 80 MG tablet TAKE 1 TABLET (80 MG TOTAL) DAILY AT 6 PM.   Blood Glucose Calibration (ACCU-CHEK  AVIVA) SOLN Use to calibrate blood glucose machine as recommended.  Diagnosis:  E11.21  Non-insulin dependent.   Blood Glucose Monitoring Suppl (ACCU-CHEK GUIDE) w/Device KIT Use to check blood sugar once daily.   carvedilol (COREG) 3.125 MG tablet Take 1 tablet (3.125 mg total) by mouth 2 (two) times daily.   ezetimibe (ZETIA) 10 MG tablet Take 1 tablet (10 mg total) by mouth daily.   finasteride (PROSCAR) 5 MG tablet Take 1 tablet (5 mg total) by mouth daily.   furosemide (LASIX) 20 MG tablet Take 1 tablet (20 mg total) by mouth every morning.   gabapentin (NEURONTIN) 300 MG capsule Take 2 capsules (600 mg total) by mouth 2 (two) times daily.   glipiZIDE (GLUCOTROL XL) 10 MG 24 hr tablet Take 1 tablet (10 mg total) by mouth daily with breakfast.   glucose blood (ACCU-CHEK AVIVA PLUS) test strip Test Blood Sugar 1 time daily: Dx:E11.27   indomethacin (INDOCIN) 25 MG capsule Take 1 capsule (25 mg total) by mouth 3 (three) times daily as needed.   sacubitril-valsartan (ENTRESTO) 24-26 MG TAKE ONE TABLET BY MOUTH EVERY MORNING and TAKE ONE TABLET BY MOUTH EVERY EVENING   tamsulosin (FLOMAX) 0.4 MG CAPS capsule Take 1 capsule (0.4 mg total) by mouth daily.   No facility-administered encounter medications on file as of 04/19/2021.    Patient Active Problem List   Diagnosis Date Noted   Memory dysfunction 03/10/2021   Mass of right foot 03/10/2021   Idiopathic gout of multiple sites 10/07/2018   Grief 06/16/2018   Dizziness 06/16/2018   Allergic  rhinitis 04/24/2018   S/P CABG x 3 09/02/2017   Leukocytosis 08/27/2017   Visit for well man health check 05/07/2017   COPD (chronic obstructive pulmonary disease) ([name]) 23/55/7322   Chronic systolic CHF (congestive heart failure) ([name]) 10/18/2016   Generalized OA 06/22/2014   CKD (chronic kidney disease) stage 3, GFR 30-59 ml/min (HCC)    Synovial cyst of lumbar spine 07/17/2013    Class: Diagnosis of   Osteoarthritis of left hip 09/17/2012    Automatic implantable cardioverter-defibrillator in situ - [employer] 02/54/2706   Chronic systolic heart failure ([name]) 05/01/2011   IMPOTENCE OF ORGANIC ORIGIN 02/20/2010   Vitamin D deficiency 02/14/2010   Type 2 diabetes with nephropathy ([name]) 01/11/2010   Implantable cardioverter-defibrillator (ICD) in situ 05/10/2009   HLD (hyperlipidemia) 02/24/2008   Essential hypertension 02/24/2008   Non-ST elevation (NSTEMI) myocardial infarction ([place]) 02/24/2008   HYPERSOMNIA UNSPECIFIED 02/24/2008    Conditions to be addressed/monitored: Dementia; Cognitive Deficits  Care Plan : LCSW Plan of Care  Updates made by [name], LCSW since 04/19/2021 12:00 AM     Problem: Psychosocial Adjustment to Dementia-legal/safety plan Resolved 04/19/2021  Priority: High     Long-Range Goal: Provide pt/family with resources, support and knowledge to optimize pt's safety, legal concerns, etc Completed 04/19/2021  Start Date: 02/13/2021  Expected End Date: 04/28/2021  This Visit's Progress: On track  Recent Progress: On track  Priority: High  Note:   Current barriers:   Concerns related to memory loss impacting pt's safety and ability to manage; insurance, etc Currently unable to  independently self manage needs related to chronic health conditions.  Knowledge Deficits related to short term plan for care coordination needs and long term plans for chronic disease management needs Clinical Goals: patient will work with CSW, PCP, and other providers  to address needs related to memory loss, care needs, etc Interventions:  CSW spoke with pt's son for follow up from previous call to pt and son. Son reports pt's appointment with new PCP was rescheduled for 03/08/21. Son plans to attend visit with pt.  Encouraged son to discuss concerns, treatment  (including the OTC drug he has started giving his day for memory loss) with PCP.   Son is also assisting with HCPOA documents- he has printed the copy CSW emailed  and will help pt with completion and with [place]ary the [place], Advance Directive documents.  Assessment of needs, barriers , as well as how impacting  Review various resources and discussed options  ( POA, Guardianship, etc) Collaboration with PCP regarding development and update of comprehensive plan of care as evidenced by provider attestation and co-signature Inter-disciplinary care team collaboration (see longitudinal plan of care)  Other interventions provided: Solution-Focused Strategies, Active listening / Reflection utilized , [place] and reviewed process , and [place] of Attorney  Patient self-care activities: Over the next 30 days Call your insurance provider for more information about your Enhanced Benefits  Follow up on insurance options ( [employer], [employer]  and [place]) I have placed a referral to the care guide for community resources, they will follow up with you  Review Guardianship papers, contact DSS at the phone on the front of the packet with questions  Follow Up Plan:   Appointment scheduled for SW follow up with client by phone on: 03/10/21       Follow Up Plan: Client/son will reach out to CSW if further needs arise  Eduard Clos MSW, LCSW Licensed Clinical Social Worker [place] [phone]

## 2021-04-19 NOTE — Patient Instructions (Signed)
Visit Information  PATIENT GOALS:  Goals Addressed   None     The patient verbalized understanding of instructions, educational materials, and care plan provided today and declined offer to receive copy of patient instructions, educational materials, and care plan.   No further follow up required:    Eduard Clos MSW, LCSW Licensed Clinical Social Worker [place] [phone]

## 2021-04-19 NOTE — Progress Notes (Signed)
No ICM remote transmission received for 04/17/2021 and next ICM transmission scheduled for 05/15/2021.

## 2021-05-04 ENCOUNTER — Other Ambulatory Visit: Payer: Self-pay | Admitting: Family

## 2021-05-04 ENCOUNTER — Telehealth: Payer: Self-pay

## 2021-05-04 DIAGNOSIS — E790 Hyperuricemia without signs of inflammatory arthritis and tophaceous disease: Secondary | ICD-10-CM

## 2021-05-04 NOTE — Progress Notes (Addendum)
Chronic Care Management Pharmacy Assistant   Name: Randy [name]  MRN: [id number] DOB: 02/25/1946  Reason for Encounter: Medication Adherence and Delivery Coordination   Recent office visits:  None since last CCM contact  Recent consult visits:  04/19/2021 - Eduard Clos, LCSW - Patient presented for CCM. No other information given.   Hospital visits:  None in previous 6 months  Medications: Outpatient Encounter Medications as of 05/04/2021  Medication Sig   Accu-Chek Softclix Lancets lancets USE AS DIRECTED  TO TEST BLOOD SUGAR ONE TIME DAILY  AND AS NEEDED   Alcohol Swabs (B-D SINGLE USE SWABS REGULAR) PADS Use as instructed to clean area for glucose monitoring once daily and as needed.  Diagnosis:  E11.21  Non insulin-dependent   allopurinol (ZYLOPRIM) 100 MG tablet Take 0.5 tablets (50 mg total) by mouth daily.   apixaban (ELIQUIS) 5 MG TABS tablet Take 1 tablet (5 mg total) by mouth 2 (two) times daily.   atorvastatin (LIPITOR) 80 MG tablet TAKE 1 TABLET (80 MG TOTAL) DAILY AT 6 PM.   Blood Glucose Calibration (ACCU-CHEK AVIVA) SOLN Use to calibrate blood glucose machine as recommended.  Diagnosis:  E11.21  Non-insulin dependent.   Blood Glucose Monitoring Suppl (ACCU-CHEK GUIDE) w/Device KIT Use to check blood sugar once daily.   carvedilol (COREG) 3.125 MG tablet Take 1 tablet (3.125 mg total) by mouth 2 (two) times daily.   ezetimibe (ZETIA) 10 MG tablet Take 1 tablet (10 mg total) by mouth daily.   finasteride (PROSCAR) 5 MG tablet Take 1 tablet (5 mg total) by mouth daily.   furosemide (LASIX) 20 MG tablet Take 1 tablet (20 mg total) by mouth every morning.   gabapentin (NEURONTIN) 300 MG capsule Take 2 capsules (600 mg total) by mouth 2 (two) times daily.   glipiZIDE (GLUCOTROL XL) 10 MG 24 hr tablet Take 1 tablet (10 mg total) by mouth daily with breakfast.   glucose blood (ACCU-CHEK AVIVA PLUS) test strip Test Blood Sugar 1 time daily: Dx:E11.27   indomethacin  (INDOCIN) 25 MG capsule Take 1 capsule (25 mg total) by mouth 3 (three) times daily as needed.   sacubitril-valsartan (ENTRESTO) 24-26 MG TAKE ONE TABLET BY MOUTH EVERY MORNING and TAKE ONE TABLET BY MOUTH EVERY EVENING   tamsulosin (FLOMAX) 0.4 MG CAPS capsule Take 1 capsule (0.4 mg total) by mouth daily.   No facility-administered encounter medications on file as of 05/04/2021.   BP Readings from Last 3 Encounters:  03/10/21 (!) 108/56  05/30/20 116/62  01/06/20 (!) 94/58    Lab Results  Component Value Date   HGBA1C 8.9 (H) 03/10/2021    Recent OV, Consult or Hospital visit:  No medication changes indicated  Last adherence delivery date: 04/17/2021      Patient is due for next adherence delivery on: 05/16/2021  Spoke with patient on 05/05/2021 reviewed medications and coordinated delivery.   This delivery to include: Adherence Packaging  30 Days  Packs: Carvedilol 3.125 mg - 1 BID (breakfast and evening meal) Gabapentin 300 mg - 2 BID (breakfast and evening meal) Furosemide 20 mg - 1 daily (breakfast) Allopurinol 100 mg - 1/2 tablet daily (breakfast) Tamsulosin 0.25m - 1 daily (evening meal) Finasteride 5 mg - 1 daily (breakfast) Atorvastatin 80 mg - 1 daily (breakfast) Glipizide ER 10 mg - 1 daily  (breakfast) Zetia 10 mg- 1 tablet daily (breakfast)    Patient declined the following medications this month: Entresto 24-253mtake one tablet morning 1 tablet  evening - Patient assistance program  Any concerns about your medications? No  How often do you forget or accidentally miss a dose? Never  Do you use a pillbox? No  Is patient in packaging Yes  If yes  What is the date on your next pill pack? Patient was unable to access packs during phone call.   Any concerns or issues with your packaging? No  Refills requested from providers include: Furosemide 20 mg - 1 daily (breakfast) and Allopurinol 100 mg - 1/2 tablet daily (breakfast)  Confirmed delivery date of  05/16/2021, advised patient that pharmacy will contact them the morning of delivery.   Recent blood pressure readings are as follows: Patient does not take any readings.  Recent blood glucose readings are as follows: Patient does not take any readings.   Annual wellness visit in last year? No Most Recent BP reading: 108/56 on 03/10/2021  If Diabetic: Most recent A1C reading: 8.9 on 03/10/2021 Last eye exam / retinopathy screening: 07/14/2019 Last diabetic foot exam: 11/19/2020  Debbora Dus, CPP notified  Marijean Niemann, [place] [phone]   Time Spent: 38 Minutes  I have reviewed the care management and care coordination activities outlined in this encounter and I am certifying that I agree with the content of this note. No further action required.  Debbora Dus, PharmD Clinical Pharmacist [place] at [facility] [phone]

## 2021-05-17 ENCOUNTER — Telehealth: Payer: Self-pay

## 2021-05-17 NOTE — Progress Notes (Signed)
Chronic Care Management Pharmacy Assistant   Name: Randy [name]  MRN: [id number] DOB: 14-May-1946  Reason for Encounter: Medication Adherence and Packaging Instruction  Medications: Outpatient Encounter Medications as of 05/17/2021  Medication Sig   Accu-Chek Softclix Lancets lancets USE AS DIRECTED  TO TEST BLOOD SUGAR ONE TIME DAILY  AND AS NEEDED   Alcohol Swabs (B-D SINGLE USE SWABS REGULAR) PADS Use as instructed to clean area for glucose monitoring once daily and as needed.  Diagnosis:  E11.21  Non insulin-dependent   allopurinol (ZYLOPRIM) 100 MG tablet Take 0.5 tablets (50 mg total) by mouth daily.   apixaban (ELIQUIS) 5 MG TABS tablet Take 1 tablet (5 mg total) by mouth 2 (two) times daily.   atorvastatin (LIPITOR) 80 MG tablet TAKE 1 TABLET (80 MG TOTAL) DAILY AT 6 PM.   Blood Glucose Calibration (ACCU-CHEK AVIVA) SOLN Use to calibrate blood glucose machine as recommended.  Diagnosis:  E11.21  Non-insulin dependent.   Blood Glucose Monitoring Suppl (ACCU-CHEK GUIDE) w/Device KIT Use to check blood sugar once daily.   carvedilol (COREG) 3.125 MG tablet Take 1 tablet (3.125 mg total) by mouth 2 (two) times daily.   ezetimibe (ZETIA) 10 MG tablet Take 1 tablet (10 mg total) by mouth daily.   finasteride (PROSCAR) 5 MG tablet Take 1 tablet (5 mg total) by mouth daily.   furosemide (LASIX) 20 MG tablet Take 1 tablet (20 mg total) by mouth every morning.   gabapentin (NEURONTIN) 300 MG capsule Take 2 capsules (600 mg total) by mouth 2 (two) times daily.   glipiZIDE (GLUCOTROL XL) 10 MG 24 hr tablet Take 1 tablet (10 mg total) by mouth daily with breakfast.   glucose blood (ACCU-CHEK AVIVA PLUS) test strip Test Blood Sugar 1 time daily: Dx:E11.27   indomethacin (INDOCIN) 25 MG capsule Take 1 capsule (25 mg total) by mouth 3 (three) times daily as needed.   sacubitril-valsartan (ENTRESTO) 24-26 MG TAKE ONE TABLET BY MOUTH EVERY MORNING and TAKE ONE TABLET BY MOUTH EVERY EVENING    tamsulosin (FLOMAX) 0.4 MG CAPS capsule Take 1 capsule (0.4 mg total) by mouth daily.   No facility-administered encounter medications on file as of 05/17/2021.   BP Readings from Last 3 Encounters:  03/10/21 (!) 108/56  05/30/20 116/62  01/06/20 (!) 94/58    Lab Results  Component Value Date   HGBA1C 8.9 (H) 03/10/2021    Spoke with patient on 05/17/2021 review medication adherence and packaging instructions.   Adherence delivery on: 05/06/2021  This delivery included: Adherence Packaging  30 Days  Packs:  Carvedilol 3.125 mg - 1 BID (breakfast and evening meal) Gabapentin 300 mg - 2 BID (breakfast and evening meal) Furosemide 20 mg - 1 daily (breakfast) Allopurinol 100 mg - 1/2 tablet daily (breakfast) Tamsulosin 0.81m - 1 daily (evening meal) Finasteride 5 mg - 1 daily (breakfast) Atorvastatin 80 mg - 1 daily (breakfast) Glipizide ER 10 mg - 1 daily  (breakfast) Zetia 10 mg- 1 tablet daily (breakfast)   I spoke with patient and confirmed delivery of his medications. I verified with patient that he should take a pack at breakfast (that would contain 8 tablets) and a pack at dinner (that would contain 3 tablets). I explained how each pack has the date the medication should be taken and a symbol that represents the time of day. Patient acknowledged his understanding. Patient did not have any questions or concerns    MDebbora Dus CPP notified  AMarijean Niemann RMA  Clinical Pharmacy Assistant [phone]   Time Spent: 20 Minutes

## 2021-05-18 ENCOUNTER — Telehealth: Payer: Self-pay

## 2021-05-18 NOTE — Telephone Encounter (Signed)
LMOVM for patient to send missed ICM transmission. 

## 2021-05-22 NOTE — Chronic Care Management (AMB) (Signed)
Chronic Care Management    Clinical Social Work Note  05/22/2021 Name: Randy [name] MRN: [id number] DOB: Dec 23, 1945  Randy [name] is a 75 y.o. year old male who is a primary care patient of Randy [name], [place], NP. The CCM team was consulted to assist the patient with chronic disease management and/or care coordination needs related to: [employer]  and Caregiver Stress.   Engaged with patient by telephone for follow up visit in response to provider referral for social work chronic care management and care coordination services.   Consent to Services:  The patient was given information about Chronic Care Management services, agreed to services, and gave verbal consent prior to initiation of services.  Please see initial visit note for detailed documentation.   Patient agreed to services and consent obtained.   Assessment: Review of patient past medical history, allergies, medications, and health status, including review of relevant consultants reports was performed today as part of a comprehensive evaluation and provision of chronic care management and care coordination services.     SDOH (Social Determinants of Health) assessments and interventions performed:    Advanced Directives Status: Not addressed in this encounter.  CCM Care Plan  No Known Allergies  Outpatient Encounter Medications as of 04/19/2021  Medication Sig   Accu-Chek Softclix Lancets lancets USE AS DIRECTED  TO TEST BLOOD SUGAR ONE TIME DAILY  AND AS NEEDED   Alcohol Swabs (B-D SINGLE USE SWABS REGULAR) PADS Use as instructed to clean area for glucose monitoring once daily and as needed.  Diagnosis:  E11.21  Non insulin-dependent   allopurinol (ZYLOPRIM) 100 MG tablet Take 0.5 tablets (50 mg total) by mouth daily.   apixaban (ELIQUIS) 5 MG TABS tablet Take 1 tablet (5 mg total) by mouth 2 (two) times daily.   atorvastatin (LIPITOR) 80 MG tablet TAKE 1 TABLET (80 MG TOTAL) DAILY AT 6 PM.   Blood Glucose  Calibration (ACCU-CHEK AVIVA) SOLN Use to calibrate blood glucose machine as recommended.  Diagnosis:  E11.21  Non-insulin dependent.   Blood Glucose Monitoring Suppl (ACCU-CHEK GUIDE) w/Device KIT Use to check blood sugar once daily.   carvedilol (COREG) 3.125 MG tablet Take 1 tablet (3.125 mg total) by mouth 2 (two) times daily.   ezetimibe (ZETIA) 10 MG tablet Take 1 tablet (10 mg total) by mouth daily.   finasteride (PROSCAR) 5 MG tablet Take 1 tablet (5 mg total) by mouth daily.   furosemide (LASIX) 20 MG tablet Take 1 tablet (20 mg total) by mouth every morning.   gabapentin (NEURONTIN) 300 MG capsule Take 2 capsules (600 mg total) by mouth 2 (two) times daily.   glipiZIDE (GLUCOTROL XL) 10 MG 24 hr tablet Take 1 tablet (10 mg total) by mouth daily with breakfast.   glucose blood (ACCU-CHEK AVIVA PLUS) test strip Test Blood Sugar 1 time daily: Dx:E11.27   indomethacin (INDOCIN) 25 MG capsule Take 1 capsule (25 mg total) by mouth 3 (three) times daily as needed.   sacubitril-valsartan (ENTRESTO) 24-26 MG TAKE ONE TABLET BY MOUTH EVERY MORNING and TAKE ONE TABLET BY MOUTH EVERY EVENING   tamsulosin (FLOMAX) 0.4 MG CAPS capsule Take 1 capsule (0.4 mg total) by mouth daily.   No facility-administered encounter medications on file as of 04/19/2021.    Patient Active Problem List   Diagnosis Date Noted   Memory dysfunction 03/10/2021   Mass of right foot 03/10/2021   Idiopathic gout of multiple sites 10/07/2018   Grief 06/16/2018   Dizziness 06/16/2018  Allergic rhinitis 04/24/2018   S/P CABG x 3 09/02/2017   Leukocytosis 08/27/2017   Visit for well man health check 05/07/2017   COPD (chronic obstructive pulmonary disease) ([place]) 90/21/1155   Chronic systolic CHF (congestive heart failure) ([place]) 10/18/2016   Generalized OA 06/22/2014   CKD (chronic kidney disease) stage 3, GFR 30-59 ml/min (HCC)    Synovial cyst of lumbar spine 07/17/2013    Class: Diagnosis of   Osteoarthritis of  left hip 09/17/2012   Automatic implantable cardioverter-defibrillator in situ - St Jude 20/80/2233   Chronic systolic heart failure ([place]) 05/01/2011   IMPOTENCE OF ORGANIC ORIGIN 02/20/2010   Vitamin D deficiency 02/14/2010   Type 2 diabetes with nephropathy ([place]) 01/11/2010   Implantable cardioverter-defibrillator (ICD) in situ 05/10/2009   HLD (hyperlipidemia) 02/24/2008   Essential hypertension 02/24/2008   Non-ST elevation (NSTEMI) myocardial infarction ([place]) 02/24/2008   HYPERSOMNIA UNSPECIFIED 02/24/2008    Conditions to be addressed/monitored:  care needs ; Limited social support, ADL IADL limitations, Mental Health Concerns , and Limited access to caregiver  There are no care plans that you recently modified to display for this patient.    Follow Up Plan: SW will follow up with patient by phone over the next 4 weeks      Eduard Clos MSW, [place] Licensed Clinical Social Worker [place] [phone]

## 2021-05-24 ENCOUNTER — Ambulatory Visit (INDEPENDENT_AMBULATORY_CARE_PROVIDER_SITE_OTHER): Payer: Medicare PPO | Admitting: *Deleted

## 2021-05-24 DIAGNOSIS — I5022 Chronic systolic (congestive) heart failure: Secondary | ICD-10-CM | POA: Diagnosis not present

## 2021-05-24 DIAGNOSIS — R413 Other amnesia: Secondary | ICD-10-CM | POA: Diagnosis not present

## 2021-05-24 NOTE — Patient Instructions (Signed)
Visit Information  PATIENT GOALS:  Goals Addressed               This Visit's Progress     COMPLETED: Keeping Myself/Loved One Safe-Dementia (pt-stated)        Timeframe:  Short-Term Goal Priority:  High Start Date:   02/13/21                          Expected End Date:      07/28/21           Follow Up Date 06/21/21  - continue taking medications as prescribed  -Why is this important?   Safety is important when you or your loved one has dementia.  Eyesight, hearing and changes in feelings of hot and cold or depth-perception may occur.  Wandering or getting lost may happen.  You/your loved one may have to stop driving.  Taking steps to improve safety can prevent injuries.  It will also help you/your loved one feel more relaxed.  It will help you/your loved one be independent for a longer time.     Notes:         The patient verbalized understanding of instructions, educational materials, and care plan provided today and declined offer to receive copy of patient instructions, educational materials, and care plan.   Telephone follow up appointment with care management team member scheduled for:06/21/21  Eduard Clos MSW, LCSW Licensed Clinical Social Worker [place] [phone]

## 2021-05-24 NOTE — Chronic Care Management (AMB) (Signed)
Chronic Care Management    Clinical Social Work Note  05/24/2021 Name: Randy [name] MRN: [id number] DOB: [birth year]  Randy [name] is a 75 y.o. year old male who is a primary care patient of Osiel, Stick, NP. The CCM team was consulted to assist the patient with chronic disease management and/or care coordination needs related to: [employer]  and [employer].   Engaged with patient by telephone for follow up visit in response to provider referral for social work chronic care management and care coordination services.   Consent to Services:  The patient was given information about Chronic Care Management services, agreed to services, and gave verbal consent prior to initiation of services.  Please see initial visit note for detailed documentation.   Patient agreed to services and consent obtained.   Assessment: Review of patient past medical history, allergies, medications, and health status, including review of relevant consultants reports was performed today as part of a comprehensive evaluation and provision of chronic care management and care coordination services.     SDOH (Social Determinants of Health) assessments and interventions performed:  SDOH Interventions    Flowsheet Row Most Recent Value  SDOH Interventions   Transportation Interventions Intervention Not Indicated        Advanced Directives Status: See Care Plan for related entries.  CCM Care Plan  No Known Allergies  Outpatient Encounter Medications as of 05/24/2021  Medication Sig   Accu-Chek Softclix Lancets lancets USE AS DIRECTED  TO TEST BLOOD SUGAR ONE TIME DAILY  AND AS NEEDED   Alcohol Swabs (B-D SINGLE USE SWABS REGULAR) PADS Use as instructed to clean area for glucose monitoring once daily and as needed.  Diagnosis:  E11.21  Non insulin-dependent   allopurinol (ZYLOPRIM) 100 MG tablet Take 0.5 tablets (50 mg total) by mouth daily.   apixaban (ELIQUIS) 5 MG  TABS tablet Take 1 tablet (5 mg total) by mouth 2 (two) times daily.   atorvastatin (LIPITOR) 80 MG tablet TAKE 1 TABLET (80 MG TOTAL) DAILY AT 6 PM.   Blood Glucose Calibration (ACCU-CHEK AVIVA) SOLN Use to calibrate blood glucose machine as recommended.  Diagnosis:  E11.21  Non-insulin dependent.   Blood Glucose Monitoring Suppl (ACCU-CHEK GUIDE) w/Device KIT Use to check blood sugar once daily.   carvedilol (COREG) 3.125 MG tablet Take 1 tablet (3.125 mg total) by mouth 2 (two) times daily.   ezetimibe (ZETIA) 10 MG tablet Take 1 tablet (10 mg total) by mouth daily.   finasteride (PROSCAR) 5 MG tablet Take 1 tablet (5 mg total) by mouth daily.   furosemide (LASIX) 20 MG tablet Take 1 tablet (20 mg total) by mouth every morning.   gabapentin (NEURONTIN) 300 MG capsule Take 2 capsules (600 mg total) by mouth 2 (two) times daily.   glipiZIDE (GLUCOTROL XL) 10 MG 24 hr tablet Take 1 tablet (10 mg total) by mouth daily with breakfast.   glucose blood (ACCU-CHEK AVIVA PLUS) test strip Test Blood Sugar 1 time daily: Dx:E11.27   indomethacin (INDOCIN) 25 MG capsule Take 1 capsule (25 mg total) by mouth 3 (three) times daily as needed.   sacubitril-valsartan (ENTRESTO) 24-26 MG TAKE ONE TABLET BY MOUTH EVERY MORNING and TAKE ONE TABLET BY MOUTH EVERY EVENING   tamsulosin (FLOMAX) 0.4 MG CAPS capsule Take 1 capsule (0.4 mg total) by mouth daily.   No facility-administered encounter medications on file as of 05/24/2021.    Patient Active Problem List   Diagnosis Date  Noted   Memory dysfunction 03/10/2021   Mass of right foot 03/10/2021   Idiopathic gout of multiple sites 10/07/2018   Grief 06/16/2018   Dizziness 06/16/2018   Allergic rhinitis 04/24/2018   S/P CABG x 3 09/02/2017   Leukocytosis 08/27/2017   Visit for well man health check 05/07/2017   COPD (chronic obstructive pulmonary disease) ([place]) 47/03/2956   Chronic systolic CHF (congestive heart failure) ([place]) 10/18/2016   Generalized OA  06/22/2014   CKD (chronic kidney disease) stage 3, GFR 30-59 ml/min (HCC)    Synovial cyst of lumbar spine 07/17/2013    Class: Diagnosis of   Osteoarthritis of left hip 09/17/2012   Automatic implantable cardioverter-defibrillator in situ - St Jude 47/34/0370   Chronic systolic heart failure ([place]) 05/01/2011   IMPOTENCE OF ORGANIC ORIGIN 02/20/2010   Vitamin D deficiency 02/14/2010   Type 2 diabetes with nephropathy ([place]) 01/11/2010   Implantable cardioverter-defibrillator (ICD) in situ 05/10/2009   HLD (hyperlipidemia) 02/24/2008   Essential hypertension 02/24/2008   Non-ST elevation (NSTEMI) myocardial infarction ([place]) 02/24/2008   HYPERSOMNIA UNSPECIFIED 02/24/2008    Conditions to be addressed/monitored: Dementia; Mental Health Concerns  and Cognitive Deficits  Care Plan : LCSW Plan of Care  Updates made by [name], LCSW since 05/24/2021 12:00 AM     Problem: Mobility and Independence      Long-Range Goal: Mobility and Independence Optimized   Start Date: 05/24/2021  Expected End Date: 07/28/2021  This Visit's Progress: On track  Priority: High  Note:   Current barriers:   Patient in need of assistance with connecting to community resources for Limited social support, Level of care concerns, and Cognitive Deficits Acknowledges deficits with meeting this unmet need Patient is unable to independently navigate community resource options without care coordination support Clinical Goals:  patient will demonstrate improved health management independence as evidenced byfamily report, overall activity/outcomes Clinical Interventions:  Son reports pt is dong better- stating may be due to memory medicine he is now taking and/or better compliance with all meds.  Son reports pt is driving to [facility] and doing things more appropriately and is pleased to see this.  Son to email/fax copy of pt's HCPOA papers that have been completed.  Collaboration with Michela Pitcher, NP  regarding development and update of comprehensive plan of care as evidenced by provider attestation and co-signature Inter-disciplinary care team collaboration (see longitudinal plan of care) Assessment of needs, barriers , agencies contacted, as well as how impacting  Review various resources, discussed options and provided patient information about Limited social support, Level of care concerns, Social Isolation, and Memory Deficits Collaborated with appropriate clinical care team members regarding patient needs Memory Deficits, Dementia Referral placed via [place] [facility] Patient interviewed and appropriate assessments performed Other interventions provided: Reviewed mental health medications with patient and discussed importance of compliance:  Increase in actives / exercise encouraged  Patient Goals:  - continue taking medications as prescribed Follow Up Plan: SW will follow up with patient by phone over the next 30 days        Follow Up Plan: Appointment scheduled for SW follow up with client by phone on: 06/21/21      Eduard Clos MSW, [place] Licensed Clinical Social Worker [place] [phone]

## 2021-05-24 NOTE — Progress Notes (Signed)
No ICM remote transmission received for 05/15/2021 and next ICM transmission scheduled for 06/05/2021.

## 2021-06-02 ENCOUNTER — Other Ambulatory Visit: Payer: Self-pay | Admitting: Family

## 2021-06-02 DIAGNOSIS — E790 Hyperuricemia without signs of inflammatory arthritis and tophaceous disease: Secondary | ICD-10-CM

## 2021-06-05 ENCOUNTER — Telehealth: Payer: Self-pay

## 2021-06-05 NOTE — Chronic Care Management (AMB) (Addendum)
Chronic Care Management Pharmacy Assistant   Name: [name]  MRN: [id number] DOB: 29-Jun-1946   Reason for Encounter Medication Adherence and Delivery Coordination    Recent office visits:  05/24/21-Family Medicine-Telemedicine-Social worker follow up, no medication changes.  Recent consult visits:  None since last CCM contact  Hospital visits:  None in previous 6 months  Medications: Outpatient Encounter Medications as of 06/05/2021  Medication Sig   Accu-Chek Softclix Lancets lancets USE AS DIRECTED  TO TEST BLOOD SUGAR ONE TIME DAILY  AND AS NEEDED   Alcohol Swabs (B-D SINGLE USE SWABS REGULAR) PADS Use as instructed to clean area for glucose monitoring once daily and as needed.  Diagnosis:  E11.21  Non insulin-dependent   allopurinol (ZYLOPRIM) 100 MG tablet TAKE 1/2 TABLET BY MOUTH ONCE DAILY   apixaban (ELIQUIS) 5 MG TABS tablet Take 1 tablet (5 mg total) by mouth 2 (two) times daily.   atorvastatin (LIPITOR) 80 MG tablet TAKE 1 TABLET (80 MG TOTAL) DAILY AT 6 PM.   Blood Glucose Calibration (ACCU-CHEK AVIVA) SOLN Use to calibrate blood glucose machine as recommended.  Diagnosis:  E11.21  Non-insulin dependent.   Blood Glucose Monitoring Suppl (ACCU-CHEK GUIDE) w/Device KIT Use to check blood sugar once daily.   carvedilol (COREG) 3.125 MG tablet Take 1 tablet (3.125 mg total) by mouth 2 (two) times daily.   ezetimibe (ZETIA) 10 MG tablet Take 1 tablet (10 mg total) by mouth daily.   finasteride (PROSCAR) 5 MG tablet Take 1 tablet (5 mg total) by mouth daily.   furosemide (LASIX) 20 MG tablet TAKE ONE TABLET BY MOUTH EVERY MORNING   gabapentin (NEURONTIN) 300 MG capsule Take 2 capsules (600 mg total) by mouth 2 (two) times daily.   glipiZIDE (GLUCOTROL XL) 10 MG 24 hr tablet Take 1 tablet (10 mg total) by mouth daily with breakfast.   glucose blood (ACCU-CHEK AVIVA PLUS) test strip Test Blood Sugar 1 time daily: Dx:E11.27   indomethacin (INDOCIN) 25 MG capsule Take 1  capsule (25 mg total) by mouth 3 (three) times daily as needed.   sacubitril-valsartan (ENTRESTO) 24-26 MG TAKE ONE TABLET BY MOUTH EVERY MORNING and TAKE ONE TABLET BY MOUTH EVERY EVENING   tamsulosin (FLOMAX) 0.4 MG CAPS capsule Take 1 capsule (0.4 mg total) by mouth daily.   No facility-administered encounter medications on file as of 06/05/2021.   BP Readings from Last 3 Encounters:  03/10/21 (!) 108/56  05/30/20 116/62  01/06/20 (!) 94/58    Lab Results  Component Value Date   HGBA1C 8.9 (H) 03/10/2021      Recent OV, Consult or Hospital visit: 05/24/21- Telemedicine from [facility] officer, [facility]. No medication changes indicated   Last adherence delivery date:05/06/21      Patient is due for next adherence delivery on: 06/15/21  Spoke with patient on 06/05/21 reviewed medications and coordinated delivery.  This delivery to include: Adherence Packaging  30 Days  Packs: Carvedilol 3.125 mg - 1 BID (breakfast and evening meal) Gabapentin 300 mg - 2 BID (breakfast and evening meal) Furosemide 20 mg - 1 daily (breakfast) Allopurinol 100 mg - 1/2 tablet daily (breakfast) Tamsulosin 0.28m - 1 daily (evening meal) Finasteride 5 mg - 1 daily (breakfast) Atorvastatin 80 mg - 1 daily (breakfast) Glipizide ER 10 mg - 1 daily  (breakfast) Zetia 10 mg- 1 tablet daily (breakfast)   VIAL medications: none   Patient declined the following medications this month: Entresto 24-26 mg  Take 1 tablet am take  1 tablet evening-patient gets from manufacturer   Any concerns about your medications? No  How often do you forget or accidentally miss a dose? Rarely on occasion, the medications were late getting to the patient due to insurance  Do you use a pillbox? No   Refills requested from providers include: Furosemide, allopurinol,Ezetimibe,gabapentin,carvedilol  Confirmed delivery date of 06/15/21, advised patient that pharmacy will contact them the morning of delivery.  Recent blood pressure  readings are as follows: The patient did not have any readings   Recent blood glucose readings are as follows:The patient did not have any readings   Annual wellness visit in last year? No Most Recent BP reading: 108/56  78-P  03/10/21  If Diabetic: Most recent A1C reading: 8.9  03/10/21 Last eye exam / retinopathy screening:  2020 Last diabetic foot exam: 10/30/20  Randy [name], CPP notified  Randy [name], [place] Assistant [phone]  I have reviewed the care management and care coordination activities outlined in this encounter and I am certifying that I agree with the content of this note. No further action required.  Randy [name], PharmD Clinical Pharmacist [facility] [phone]

## 2021-06-08 ENCOUNTER — Telehealth: Payer: Self-pay

## 2021-06-08 ENCOUNTER — Telehealth: Payer: Self-pay | Admitting: Nurse Practitioner

## 2021-06-08 ENCOUNTER — Other Ambulatory Visit: Payer: Self-pay | Admitting: Nurse Practitioner

## 2021-06-08 DIAGNOSIS — M79672 Pain in left foot: Secondary | ICD-10-CM

## 2021-06-08 DIAGNOSIS — M79671 Pain in right foot: Secondary | ICD-10-CM

## 2021-06-08 DIAGNOSIS — I5022 Chronic systolic (congestive) heart failure: Secondary | ICD-10-CM

## 2021-06-08 DIAGNOSIS — I10 Essential (primary) hypertension: Secondary | ICD-10-CM

## 2021-06-08 NOTE — Telephone Encounter (Signed)
LMOVM for patient to send missed ICM transmission. 

## 2021-06-08 NOTE — Telephone Encounter (Signed)
I refilled a few of his medicines. I need him to follow up in approx 1 month. We can wait until we are back in [place] but he needs to go ahead and schedule

## 2021-06-09 ENCOUNTER — Other Ambulatory Visit: Payer: Self-pay

## 2021-06-09 MED ORDER — EZETIMIBE 10 MG PO TABS: 10.0000 mg | ORAL_TABLET | Freq: Every day | ORAL | 0 refills | Status: DC

## 2021-06-09 NOTE — Progress Notes (Signed)
No ICM remote transmission received for 06/05/2021 and next ICM transmission scheduled for 06/19/2021.

## 2021-06-09 NOTE — Telephone Encounter (Signed)
Called both numbers not able to reach patient. Patient does have social work phone call with Elenora Gamma a staff message to her also to ask patient to call us and schedule office visit-late December is ok if patient can not drive to [employer] [place] location

## 2021-06-20 NOTE — Progress Notes (Signed)
No ICM remote transmission received for 06/19/2021 and next ICM transmission scheduled for 07/05/2022.

## 2021-06-21 ENCOUNTER — Telehealth: Payer: Medicare PPO

## 2021-06-21 ENCOUNTER — Telehealth: Payer: Self-pay | Admitting: *Deleted

## 2021-06-21 NOTE — Telephone Encounter (Signed)
Randy [name] was not able to reach patient or his son today. FYI to [facility] with CC chart. I am sending a letter to the patient to ask him to call us.

## 2021-06-21 NOTE — Telephone Encounter (Signed)
  Care Management   Follow Up Note   06/21/2021 Name: Randy [name] MRN: [id number] DOB: 1945/11/02   Referred by: Michela Pitcher, NP Reason for referral : No chief complaint on file.   An unsuccessful telephone outreach was attempted today. The patient was referred to the case management team for assistance with care management and care coordination.   Follow Up Plan: The care management team will reach out to the patient again over the next 10  days.   Eduard Clos MSW, LCSW Licensed Clinical Social Worker [place] [phone]

## 2021-06-26 ENCOUNTER — Telehealth: Payer: Self-pay | Admitting: *Deleted

## 2021-06-26 ENCOUNTER — Telehealth: Payer: Self-pay | Admitting: Nurse Practitioner

## 2021-06-26 NOTE — Chronic Care Management (AMB) (Signed)
  Care Management   Note  06/26/2021 Name: Randy [name] MRN: [id number] DOB: February 24, 1946  Randy [name] is a 75 y.o. year old male who is a primary care patient of Brick, Ketcher, NP and is actively engaged with the care management team. I reached out to Randy [name] by phone today to assist with re-scheduling a follow up visit with the Licensed Clinical Social Worker  Follow up plan: Telephone appointment with care management team member scheduled for: 07/14/2021  Julian Hy, [place], [place] Management  Direct Dial: [phone]

## 2021-06-26 NOTE — Telephone Encounter (Signed)
  Encourage patient to contact the pharmacy for refills or they can request refills through [place]:  Please schedule appointment if longer than 1 year  NEXT APPOINTMENT DATE:  MEDICATION:allopurinol (ZYLOPRIM) 100 MG tablet   Is the patient out of medication?   PHARMACY:[facility] [place], [place]   Let patient know to contact pharmacy at the end of the day to make sure medication is ready.  Please notify patient to allow 48-72 hours to process  CLINICAL FILLS OUT ALL BELOW:   LAST REFILL:  QTY:  REFILL DATE:    OTHER COMMENTS:    Okay for refill?  Please advise

## 2021-06-27 NOTE — Telephone Encounter (Signed)
Called patient but could not leave a message. Spoke with patient's son Ayomikun, scheduled appointment for 06/29/21, and he will come with patient. Will follow up and discuss all of his medications and mental status test to follow up on memory issues. Asked to bring all of patients medications.

## 2021-06-29 ENCOUNTER — Ambulatory Visit: Payer: Medicare PPO | Admitting: Nurse Practitioner

## 2021-06-29 ENCOUNTER — Other Ambulatory Visit: Payer: Self-pay

## 2021-06-29 VITALS — BP 90/62 | HR 91 | Temp 98.1°F | Resp 16 | Ht 66.75 in | Wt 184.1 lb

## 2021-06-29 DIAGNOSIS — N183 Chronic kidney disease, stage 3 unspecified: Secondary | ICD-10-CM

## 2021-06-29 DIAGNOSIS — E1121 Type 2 diabetes mellitus with diabetic nephropathy: Secondary | ICD-10-CM

## 2021-06-29 DIAGNOSIS — E782 Mixed hyperlipidemia: Secondary | ICD-10-CM

## 2021-06-29 DIAGNOSIS — I10 Essential (primary) hypertension: Secondary | ICD-10-CM | POA: Diagnosis not present

## 2021-06-29 DIAGNOSIS — G3184 Mild cognitive impairment, so stated: Secondary | ICD-10-CM

## 2021-06-29 DIAGNOSIS — R2241 Localized swelling, mass and lump, right lower limb: Secondary | ICD-10-CM

## 2021-06-29 LAB — LIPID PANEL
Cholesterol: 182 mg/dL (ref 0–200)
HDL: 35.8 mg/dL — ABNORMAL LOW (ref >39.00)
LDL Cholesterol: 109 mg/dL — ABNORMAL HIGH (ref 0–99)
NonHDL: 146.69
Total CHOL/HDL Ratio: 5
Triglycerides: 188 mg/dL — ABNORMAL HIGH (ref 0.0–149.0)
VLDL: 37.6 mg/dL (ref 0.0–40.0)

## 2021-06-29 LAB — CBC
HCT: 34.7 % — ABNORMAL LOW (ref 39.0–52.0)
Hemoglobin: 11.4 g/dL — ABNORMAL LOW (ref 13.0–17.0)
MCHC: 32.9 g/dL (ref 30.0–36.0)
MCV: 86.1 fl (ref 78.0–100.0)
Platelets: 204 10*3/uL (ref 150.0–400.0)
RBC: 4.03 Mil/uL — ABNORMAL LOW (ref 4.22–5.81)
RDW: 14.2 % (ref 11.5–15.5)
WBC: 6.9 10*3/uL (ref 4.0–10.5)

## 2021-06-29 LAB — COMPREHENSIVE METABOLIC PANEL
ALT: 7 U/L (ref 0–53)
AST: 12 U/L (ref 0–37)
Albumin: 3.9 g/dL (ref 3.5–5.2)
Alkaline Phosphatase: 57 U/L (ref 39–117)
BUN: 18 mg/dL (ref 6–23)
CO2: 28 mEq/L (ref 19–32)
Calcium: 9.1 mg/dL (ref 8.4–10.5)
Chloride: 102 mEq/L (ref 96–112)
Creatinine, Ser: 1.66 mg/dL — ABNORMAL HIGH (ref 0.40–1.50)
GFR: 40.04 mL/min — ABNORMAL LOW (ref >60.00)
Glucose, Bld: 173 mg/dL — ABNORMAL HIGH (ref 70–99)
Potassium: 3.8 mEq/L (ref 3.5–5.1)
Sodium: 137 mEq/L (ref 135–145)
Total Bilirubin: 0.5 mg/dL (ref 0.2–1.2)
Total Protein: 7 g/dL (ref 6.0–8.3)

## 2021-06-29 LAB — POCT GLYCOSYLATED HEMOGLOBIN (HGB A1C): Hemoglobin A1C: 7.9 % — AB (ref 4.0–5.6)

## 2021-06-29 MED ORDER — DONEPEZIL HCL 5 MG PO TABS
5.0000 mg | ORAL_TABLET | Freq: Every day | ORAL | 0 refills | Status: DC
Start: 2021-06-29 — End: 2021-09-22

## 2021-06-29 NOTE — Assessment & Plan Note (Signed)
Currently maintained on atorvastatin.  Pending labs today continue atorvastatin

## 2021-06-29 NOTE — Assessment & Plan Note (Signed)
We will recheck kidney function today in office pending lab results

## 2021-06-29 NOTE — Assessment & Plan Note (Signed)
We will rerefer to podiatry

## 2021-06-29 NOTE — Assessment & Plan Note (Signed)
Patient takes glipizide 10 mg daily.  Does not check glucose at home POC A1c in office was 7.9 which is improved from last time continue current regimen no changes at this time

## 2021-06-29 NOTE — Progress Notes (Signed)
Established Patient Office Visit  Subjective:  Patient ID: Randy [name], male    DOB: May 28, 1946  Age: 75 y.o. MRN: [id number]  CC:  Chief Complaint  Patient presents with   Medication follow up    Patient does not remember when he takes his medications and if he took them    HPI Randy [name] presents for Recheck of chronic conditions, medication reconciliation and memory issues  DM2: Does not check glucose at home currently maintained on glipizide 24m daily HTN: currently on carvedilol. Does have heart failure and takes entresto and furosemide.  Memory issues: Was mentioned previously and f/u scheduled but patient did not make it. Son is present and states he feels that his memory is lacking in the short term. Patient also endorse the same feeling. They are unsure if and when he takes his medications. Brought all his medications and bottles to office for reconciliation   Eliquis Past Medical History:  Diagnosis Date   AICD (automatic cardioverter/defibrillator) present 10/18/2016   "replaced 2009-[employer] device, Dr TLovena Le   Anemia    Arthritis    "back" (08/27/2017)   CAD (coronary artery disease)    CHF (congestive heart failure) (HCC)    CKD (chronic kidney disease) stage 3, GFR 30-59 ml/min (HCC)    [name]   Diverticulosis    Dyslipidemia    GERD (gastroesophageal reflux disease)    Hypertension    Neuromuscular disorder (HCC)    neuropathy- in hands   Osteoarthritis    hips, hands   Pacemaker    Shortness of breath    Sleep apnea    started a sleep study but didn't completed, states PCP told him he has sleep  apnea, doesn't use CPAP   Systolic dysfunction    Type 2 diabetes with nephropathy (HCC)    Vitamin D deficiency     Past Surgical History:  Procedure Laterality Date   ANTERIOR CERVICAL DECOMP/DISCECTOMY FUSION     BACK SURGERY     CARPAL TUNNEL RELEASE Left    CORONARY ARTERY BYPASS GRAFT N/A 09/02/2017   Procedure: CORONARY ARTERY BYPASS  GRAFTING (CABG) x three , using left internal mammary artery and right leg greater saphenous vein harvested endoscopically;  Surgeon: BGaye Pollack MD;  [facility];  Service: Open Heart Surgery;  Laterality: N/A;   CYST EXCISION     "back"   ICD IMPLANT N/A 10/18/2016   Procedure: ICD Implant- Upgrade to Dual Chamber;  Surgeon: GEvans Lance MD;  [facility];  Service: Cardiovascular;  Laterality: N/A;   INSERT / REPLACE / REMOVE PACEMAKER     JOINT REPLACEMENT     LEFT HEART CATH AND CORONARY ANGIOGRAPHY N/A 08/30/2017   Procedure: LEFT HEART CATH AND CORONARY ANGIOGRAPHY;  Surgeon: JMartinique Peter M, MD;  [facility];  Service: Cardiovascular;  Laterality: N/A;   LUMBAR LAMINECTOMY Left 07/17/2013   Procedure: MICRODISCECTOMY LUMBAR LAMINECTOMY;  Surgeon: MMarybelle Killings MD;  [place] [facility]  Service: Orthopedics;  Laterality: Left;  Left L4 Laminotomy, Lateral Recess Decompression, Cyst Excision   SHOULDER OPEN ROTATOR CUFF REPAIR Left    TEE WITHOUT CARDIOVERSION N/A 09/02/2017   Procedure: TRANSESOPHAGEAL ECHOCARDIOGRAM (TEE);  Surgeon: BGaye Pollack MD;  [facility] [facility]  Service: Open Heart Surgery;  Laterality: N/A;   TOTAL HIP ARTHROPLASTY Left 09/17/2012   Procedure: Left TOTAL HIP ARTHROPLASTY ANTERIOR APPROACH;  Surgeon: MMarybelle Killings MD;  [facility]  Service: Orthopedics;  Laterality: Left;    Family History  Problem Relation Age of Onset   Heart attack Father 36   Diabetes Brother     Social History   Socioeconomic History   Marital status: Divorced    Spouse name: Not on file   Number of children: 2   Years of education: Not on file   Highest education level: Not on file  Occupational History   Occupation: retired    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   Desires CPR, does want life support if not futile. 05/30/2020- discussed, patient would want CPR/ intubation. Does not have HCPOA/ living will      Social Determinants of Health   Financial Resource Strain: Not on file  Food Insecurity: Not on file  Transportation Needs: No Transportation Needs   Lack of Transportation (Medical): No   Lack of Transportation (Non-Medical): No  Physical Activity: Not on file  Stress: Not on file  Social Connections: Not on file  Intimate Partner Violence: Not on file    Outpatient Medications Prior to Visit  Medication Sig Dispense Refill   allopurinol (ZYLOPRIM) 100 MG tablet TAKE 1/2 TABLET BY MOUTH ONCE DAILY 15 tablet 0   atorvastatin (LIPITOR) 80 MG tablet TAKE 1 TABLET (80 MG TOTAL) DAILY AT 6 PM. 90 tablet 1   carvedilol (COREG) 3.125 MG tablet TAKE ONE TABLET BY MOUTH TWICE DAILY 60 tablet 2   ezetimibe (ZETIA) 10 MG tablet Take 1 tablet (10 mg total) by mouth daily. 90 tablet 0   finasteride (PROSCAR) 5 MG tablet Take 1 tablet (5 mg total) by mouth daily. 30 tablet 5   furosemide (LASIX) 20 MG tablet TAKE ONE TABLET BY MOUTH EVERY MORNING 30 tablet 0   gabapentin (NEURONTIN) 300 MG capsule TAKE TWO CAPSULES BY MOUTH TWICE DAILY 120 capsule 2   glipiZIDE (GLUCOTROL XL) 10 MG 24 hr tablet Take 1 tablet (10 mg total) by mouth daily with breakfast. 90 tablet 0   sacubitril-valsartan (ENTRESTO) 24-26 MG TAKE ONE TABLET BY MOUTH EVERY MORNING and TAKE ONE TABLET BY MOUTH EVERY EVENING 180 tablet 0   tamsulosin (FLOMAX) 0.4 MG CAPS capsule Take 1 capsule (0.4 mg total) by mouth daily. 30  capsule 5   Accu-Chek Softclix Lancets lancets USE AS DIRECTED  TO TEST BLOOD SUGAR ONE TIME DAILY  AND AS NEEDED 200 each 2   Alcohol Swabs (B-D SINGLE USE SWABS REGULAR) PADS Use as instructed to clean area for glucose monitoring once daily and as needed.  Diagnosis:  E11.21  Non insulin-dependent 100 each 3   apixaban (ELIQUIS) 5 MG TABS tablet Take 1 tablet (5 mg total) by mouth 2 (two) times daily. 60 tablet 11   Blood Glucose Calibration (ACCU-CHEK AVIVA) SOLN Use to calibrate blood glucose  machine as recommended.  Diagnosis:  E11.21  Non-insulin dependent. 1 each 2   Blood Glucose Monitoring Suppl (ACCU-CHEK GUIDE) w/Device KIT Use to check blood sugar once daily. 1 kit 0   glucose blood (ACCU-CHEK AVIVA PLUS) test strip Test Blood Sugar 1 time daily: Dx:E11.27 100 each 3   indomethacin (INDOCIN) 25 MG capsule Take 1 capsule (25 mg total) by mouth 3 (three) times daily as needed. 21 capsule 1   No facility-administered medications prior to visit.    No Known Allergies  ROS Review of Systems  Constitutional:  Negative for chills and fever.  Respiratory:  Negative for cough and shortness of breath.   Cardiovascular:  Negative for chest pain.  Gastrointestinal:  Negative for diarrhea, nausea and vomiting.  Neurological:  Negative for weakness.  Psychiatric/Behavioral:  Negative for behavioral problems and confusion.      Objective:    Physical Exam Vitals and nursing note reviewed.  Constitutional:      Appearance: Normal appearance.  Cardiovascular:     Rate and Rhythm: Normal rate and regular rhythm.     Pulses: Normal pulses.  Pulmonary:     Effort: Pulmonary effort is normal.     Breath sounds: Normal breath sounds.  Abdominal:     General: Bowel sounds are normal.  Musculoskeletal:     Right lower leg: No edema.     Left lower leg: No edema.  Skin:    Comments: Long discolored thickened toe nails on all 10 toes.   Still has golf ball sized "cyst" to right dorsum  of foot  Neurological:     Mental Status: He is alert.    BP 90/62   Pulse 91   Temp 98.1 F (36.7 C)   Resp 16   Ht 5' 6.75" (1.695 m)   Wt 184 lb 2 oz (83.5 kg)   SpO2 96%   BMI 29.05 kg/m  Wt Readings from Last 3 Encounters:  06/29/21 184 lb 2 oz (83.5 kg)  03/10/21 182 lb 8 oz (82.8 kg)  05/30/20 182 lb (82.6 kg)     Health Maintenance Due  Topic Date Due   COVID-19 Vaccine (1) Never done   TETANUS/TDAP  01/13/2018   Zoster Vaccines- Shingrix (2 of 2) 09/03/2019   COLONOSCOPY (Pts 45-7yr Insurance coverage will need to be confirmed)  02/07/2020   OPHTHALMOLOGY EXAM  07/13/2020   FOOT EXAM  11/19/2020   INFLUENZA VACCINE  02/27/2021    There are no preventive care reminders to display for this patient.  Lab Results  Component Value Date   TSH 1.45 03/10/2021   Lab Results  Component Value Date   WBC 7.8 05/30/2020   HGB 11.1 (L) 05/30/2020   HCT 33.4 (L) 05/30/2020   MCV 84.8 05/30/2020   PLT 176.0 05/30/2020   Lab Results  Component Value Date   NA 138 03/10/2021   K 3.9 03/10/2021   CO2 28 03/10/2021   GLUCOSE 231 (H) 03/10/2021   BUN 32 (H) 03/10/2021   CREATININE 1.86 (H) 03/10/2021   BILITOT 0.6 03/10/2021   ALKPHOS 62 03/10/2021   AST 22 03/10/2021   ALT 19 03/10/2021   PROT 7.1 03/10/2021   ALBUMIN 4.4 03/10/2021   CALCIUM 9.9 03/10/2021   ANIONGAP 10 09/07/2017   GFR 35.00 (L) 03/10/2021   Lab Results  Component Value Date   CHOL 197 03/10/2021   Lab Results  Component Value Date   HDL 44.60 03/10/2021   Lab Results  Component Value Date   LDLCALC 145 (H) 05/30/2020   Lab Results  Component Value Date   TRIG 244.0 (H) 03/10/2021   Lab Results  Component Value Date   CHOLHDL 4 03/10/2021   Lab Results  Component Value Date   HGBA1C 8.9 (H) 03/10/2021      Assessment & Plan:   Problem List Items Addressed This Visit       Cardiovascular and Mediastinum   Essential hypertension    Continue medications as  prescribed would prefer patient to check blood pressure at home. Follow-up with cardiology.      Relevant Orders   CBC   Comprehensive metabolic panel     Endocrine   Type 2 diabetes with nephropathy ([place])    Patient takes glipizide 10 mg daily.  Does not check glucose at home POC A1c in office was 7.9 which is improved from last time continue current regimen no changes at this time      Relevant Orders   POCT glycosylated hemoglobin (Hb A1C) (Completed)     Genitourinary   CKD (chronic kidney disease) stage 3, GFR 30-59 ml/min (HCC)    We will recheck kidney function today in office pending lab results        Other   HLD (hyperlipidemia)    Currently maintained on atorvastatin.  Pending labs today continue atorvastatin      Relevant Orders   Lipid panel   Mild cognitive impairment - Primary    Son was present in office today both patient and son concern for short-term memory dysfunction did administer Mini-Mental exam in office he does have mild cognitive impairment did discuss treatment options and beginning of donepezil.  Agree to start medication also gave tips this to try to organize a pill counter from a.m. and p.m. with medications that way he knows what to take on what day and can move forward patient and son acknowledged.  Start on donepezil 5 mg follow-up in 3 months for reevaluation.  Continue to monitor.  Did have upfront discussion about if patient is unable to take medication as prescribed the ramifications of not taking and/or taking too much medications could end up in death he acknowledged also discussed possibility of needing assisted living for medication management in future if we cannot work something out at home.      Relevant Medications   donepezil (ARICEPT) 5 MG tablet   Mass of right foot    We will rerefer to podiatry      Relevant Orders   Ambulatory referral to Podiatry    No orders of the defined types were placed in this  encounter.   Follow-up: Return in about 3 months (around 09/27/2021) for recheck.   This visit occurred during the SARS-CoV-2 public health emergency.  Safety protocols were in place, including screening questions prior to the visit, additional usage of staff PPE, and extensive cleaning of exam room while observing appropriate contact time as indicated for disinfecting solutions.   Romilda Garret, NP

## 2021-06-29 NOTE — Patient Instructions (Signed)
Nice to see you today Get a pill organizer to make sure you are taking your medications as prescribed Follow up with me in around 3 months Schedule and appointment with your cardiologist

## 2021-06-29 NOTE — Assessment & Plan Note (Addendum)
Continue medications as prescribed would prefer patient to check blood pressure at home. Follow-up with cardiology.

## 2021-06-29 NOTE — Assessment & Plan Note (Signed)
Son was present in office today both patient and son concern for short-term memory dysfunction did administer Mini-Mental exam in office he does have mild cognitive impairment did discuss treatment options and beginning of donepezil.  Agree to start medication also gave tips this to try to organize a pill counter from a.m. and p.m. with medications that way he knows what to take on what day and can move forward patient and son acknowledged.  Start on donepezil 5 mg follow-up in 3 months for reevaluation.  Continue to monitor.  Did have upfront discussion about if patient is unable to take medication as prescribed the ramifications of not taking and/or taking too much medications could end up in death he acknowledged also discussed possibility of needing assisted living for medication management in future if we cannot work something out at home.

## 2021-07-03 ENCOUNTER — Telehealth: Payer: Self-pay | Admitting: Nurse Practitioner

## 2021-07-03 ENCOUNTER — Other Ambulatory Visit: Payer: Self-pay | Admitting: Family

## 2021-07-03 DIAGNOSIS — E790 Hyperuricemia without signs of inflammatory arthritis and tophaceous disease: Secondary | ICD-10-CM

## 2021-07-03 NOTE — Telephone Encounter (Signed)
Pt called stating that he got a message to schedule an appt. Pt didn't state what the appt was for. Pt was just seen on 06/29/21 for mental issues. Im not sure what to schedule for. Please advise.

## 2021-07-03 NOTE — Telephone Encounter (Signed)
Called patient but could not leave a message. The message patient received was an old message. Disregard please.

## 2021-07-04 ENCOUNTER — Ambulatory Visit (INDEPENDENT_AMBULATORY_CARE_PROVIDER_SITE_OTHER): Payer: Medicare PPO

## 2021-07-04 ENCOUNTER — Telehealth: Payer: Self-pay

## 2021-07-04 DIAGNOSIS — I255 Ischemic cardiomyopathy: Secondary | ICD-10-CM

## 2021-07-04 NOTE — Chronic Care Management (AMB) (Addendum)
Chronic Care Management Pharmacy Assistant   Name: Randy [name]  MRN: [id number] DOB: 1946/07/24   Reason for Encounter: Medication Adherence and Delivery Coordination   Recent office visits:  06/29/21-PCP-Randy [name],NP- Patient presented for follow up cognitive impairment.labs ordered ( labs stable) New A1c 7.9. Started on Donepezil 81m take 1 at bedtime. Referral for podiatry, follow up 3 months.  Recent consult visits:  None since last CCM contact  Hospital visits:  None in previous 6 months  Medications: Outpatient Encounter Medications as of 07/04/2021  Medication Sig   Accu-Chek Softclix Lancets lancets USE AS DIRECTED  TO TEST BLOOD SUGAR ONE TIME DAILY  AND AS NEEDED   Alcohol Swabs (B-D SINGLE USE SWABS REGULAR) PADS Use as instructed to clean area for glucose monitoring once daily and as needed.  Diagnosis:  E11.21  Non insulin-dependent   allopurinol (ZYLOPRIM) 100 MG tablet TAKE 1/2 TABLET BY MOUTH ONCE DAILY   apixaban (ELIQUIS) 5 MG TABS tablet Take 1 tablet (5 mg total) by mouth 2 (two) times daily.   atorvastatin (LIPITOR) 80 MG tablet TAKE 1 TABLET (80 MG TOTAL) DAILY AT 6 PM.   Blood Glucose Calibration (ACCU-CHEK AVIVA) SOLN Use to calibrate blood glucose machine as recommended.  Diagnosis:  E11.21  Non-insulin dependent.   Blood Glucose Monitoring Suppl (ACCU-CHEK GUIDE) w/Device KIT Use to check blood sugar once daily.   carvedilol (COREG) 3.125 MG tablet TAKE ONE TABLET BY MOUTH TWICE DAILY   donepezil (ARICEPT) 5 MG tablet Take 1 tablet (5 mg total) by mouth at bedtime.   ezetimibe (ZETIA) 10 MG tablet Take 1 tablet (10 mg total) by mouth daily.   finasteride (PROSCAR) 5 MG tablet Take 1 tablet (5 mg total) by mouth daily.   furosemide (LASIX) 20 MG tablet TAKE ONE TABLET BY MOUTH EVERY MORNING   gabapentin (NEURONTIN) 300 MG capsule TAKE TWO CAPSULES BY MOUTH TWICE DAILY   glipiZIDE (GLUCOTROL XL) 10 MG 24 hr tablet Take 1 tablet (10 mg total) by mouth  daily with breakfast.   glucose blood (ACCU-CHEK AVIVA PLUS) test strip Test Blood Sugar 1 time daily: Dx:E11.27   indomethacin (INDOCIN) 25 MG capsule Take 1 capsule (25 mg total) by mouth 3 (three) times daily as needed.   sacubitril-valsartan (ENTRESTO) 24-26 MG TAKE ONE TABLET BY MOUTH EVERY MORNING and TAKE ONE TABLET BY MOUTH EVERY EVENING   tamsulosin (FLOMAX) 0.4 MG CAPS capsule Take 1 capsule (0.4 mg total) by mouth daily.   No facility-administered encounter medications on file as of 07/04/2021.   BP Readings from Last 3 Encounters:  06/29/21 90/62  03/10/21 (!) 108/56  05/30/20 116/62    Lab Results  Component Value Date   HGBA1C 7.9 (A) 06/29/2021      Recent OV, Consult or Hospital visit:  Recent medication changes indicated:  start Donepezil 5 mg take 1 tablet at bedtime 06/30/21   Last adherence delivery date:06/15/21      Patient is due for next adherence delivery on: 07/14/21  Spoke with patient on 07/05/21 reviewed medications and coordinated delivery.  This delivery to include: Adherence Packaging  30 Days  Packs: Carvedilol 3.125 mg - 1 BID (breakfast and evening meal) Gabapentin 300 mg - 2 BID (breakfast and evening meal) Furosemide 20 mg - 1 daily (breakfast) Allopurinol 100 mg - 1/2 tablet daily (breakfast) Tamsulosin 0.452m- 1 daily (evening meal) Finasteride 5 mg - 1 daily (breakfast) Atorvastatin 80 mg - 1 daily (breakfast) Glipizide ER 10 mg -  1 daily  (breakfast) Zetia 10 mg- 1 tablet daily (breakfast)  Donepezil 37m -1 tablet (bedtime)  VIAL medications: none   Patient declined the following medications this month: Entresto 24-26 mg  Take 1 tablet am take 1 tablet evening-patient gets from manufacturer   Any concerns about your medications? No  How often do you forget or accidentally miss a dose? Rarely  Do you use a pillbox? No  Is patient in packaging Yes  What is the date on your next pill pack? Patient not near packs to read date    Any concerns or issues with your packaging? None identified    Refills requested from providers include: allopurinol, furosemide   Confirmed delivery date of 07/14/21, advised patient that pharmacy will contact them the morning of delivery.  Recent blood pressure readings are as follows: no readings available  Recent blood glucose readings are as follows: no readings available   Annual wellness visit in last year? No Most Recent BP reading:90/62  91-P 06/29/21  If Diabetic: Most recent A1C reading: 7.9 06/30/21 Last eye exam / retinopathy screening:2020 Last diabetic foot exam: never done   MDebbora Dus CPP notified  VAvel Sensor CSaulsburyAssistant [phone] I have reviewed the care management and care coordination activities outlined in this encounter and I am certifying that I agree with the content of this note. No further action required.  MDebbora Dus PharmD Clinical Pharmacist LRingwoodPrimary Care at [facility]ort[phone]

## 2021-07-05 ENCOUNTER — Ambulatory Visit (INDEPENDENT_AMBULATORY_CARE_PROVIDER_SITE_OTHER): Payer: Medicare PPO

## 2021-07-05 ENCOUNTER — Ambulatory Visit: Payer: Medicare PPO | Admitting: Podiatry

## 2021-07-05 DIAGNOSIS — I5022 Chronic systolic (congestive) heart failure: Secondary | ICD-10-CM

## 2021-07-05 DIAGNOSIS — Z95810 Presence of automatic (implantable) cardiac defibrillator: Secondary | ICD-10-CM

## 2021-07-05 NOTE — Progress Notes (Signed)
Electrophysiology Office Note Date: 07/06/2021  ID:  Randy [name], DOB Jun 10, 1946, MRN [id number]  PCP: Michela Pitcher, NP Primary Cardiologist: None Electrophysiologist: Randy Peru, MD   CC: Routine ICD follow-up  Randy [name] is a 75 y.o. male seen today for Randy Peru, MD for routine electrophysiology followup.  Since last being seen in our clinic the patient reports doing OK. He does think he got shocked by his ICD 6-8 weeks ago per son. Called him to tell him. Was in the bathroom at the time. Doesn't think he passed out but doesn't really remember. He frequently forgets his medications, especially the am doses despite using pill boxes. He has been prescribed Aricept, but he is waiting on it through mail order. Otherwise, he denies chest pain, palpitations, dyspnea, PND, orthopnea, nausea, vomiting, dizziness, syncope, edema, weight gain, or early satiety. He states he   Device History: [employer], minerals ICD implanted 1/53/7943 for systolic CHF History of appropriate therapy: No History of AAD therapy: Yes, on amiodarone  Past Medical History:  Diagnosis Date   AICD (automatic cardioverter/defibrillator) present 10/18/2016   "replaced 2009-St. Jude device, Dr Lovena Le"   Anemia    Arthritis    "back" (08/27/2017)   CAD (coronary artery disease)    CHF (congestive heart failure) (HCC)    CKD (chronic kidney disease) stage 3, GFR 30-59 ml/min (HCC)    [name]   Diverticulosis    Dyslipidemia    GERD (gastroesophageal reflux disease)    Hypertension    Neuromuscular disorder (HCC)    neuropathy- in hands   Osteoarthritis    hips, hands   Pacemaker    Shortness of breath    Sleep apnea    started a sleep study but didn't completed, states PCP told him he has sleep  apnea, doesn't use CPAP   Systolic dysfunction    Type 2 diabetes with nephropathy (HCC)    Vitamin D deficiency    Past Surgical History:  Procedure Laterality Date   ANTERIOR CERVICAL  DECOMP/DISCECTOMY FUSION     BACK SURGERY     CARPAL TUNNEL RELEASE Left    CORONARY ARTERY BYPASS GRAFT N/A 09/02/2017   Procedure: CORONARY ARTERY BYPASS GRAFTING (CABG) x three , using left internal mammary artery and right leg greater saphenous vein harvested endoscopically;  Surgeon: Randy Pollack, MD;  Location: [facility];  Service: Open Heart Surgery;  Laterality: N/A;   CYST EXCISION     "back"   ICD IMPLANT N/A 10/18/2016   Procedure: ICD Implant- Upgrade to Dual Chamber;  Surgeon: Evans Lance, MD;  Location: [facility];  Service: Cardiovascular;  Laterality: N/A;   INSERT / REPLACE / REMOVE PACEMAKER     JOINT REPLACEMENT     LEFT HEART CATH AND CORONARY ANGIOGRAPHY N/A 08/30/2017   Procedure: LEFT HEART CATH AND CORONARY ANGIOGRAPHY;  Surgeon: Martinique, Peter M, MD;  Location: [facility];  Service: Cardiovascular;  Laterality: N/A;   LUMBAR LAMINECTOMY Left 07/17/2013   Procedure: MICRODISCECTOMY LUMBAR LAMINECTOMY;  Surgeon: Randy Killings, MD;  Location: [facility];  Service: Orthopedics;  Laterality: Left;  Left L4 Laminotomy, Lateral Recess Decompression, Cyst Excision   SHOULDER OPEN ROTATOR CUFF REPAIR Left    TEE WITHOUT CARDIOVERSION N/A 09/02/2017   Procedure: TRANSESOPHAGEAL ECHOCARDIOGRAM (TEE);  Surgeon: Randy Pollack, MD;  Location: [facility];  Service: Open Heart Surgery;  Laterality: N/A;   TOTAL HIP ARTHROPLASTY Left 09/17/2012   Procedure:  Left TOTAL HIP ARTHROPLASTY ANTERIOR APPROACH;  Surgeon: Randy Killings, MD;  Location: [facility];  Service: Orthopedics;  Laterality: Left;    Current Outpatient Medications  Medication Sig Dispense Refill   allopurinol (ZYLOPRIM) 100 MG tablet TAKE 1/2 TABLET BY MOUTH ONCE DAILY 15 tablet 0   apixaban (ELIQUIS) 5 MG TABS tablet Take 1 tablet (5 mg total) by mouth 2 (two) times daily. 60 tablet 11   atorvastatin (LIPITOR) 80 MG tablet TAKE 1 TABLET (80 MG TOTAL) DAILY AT 6 PM. 90 tablet 1   carvedilol (COREG) 3.125 MG tablet TAKE ONE  TABLET BY MOUTH TWICE DAILY 60 tablet 2   donepezil (ARICEPT) 5 MG tablet Take 1 tablet (5 mg total) by mouth at bedtime. 90 tablet 0   ezetimibe (ZETIA) 10 MG tablet Take 1 tablet (10 mg total) by mouth daily. 90 tablet 0   finasteride (PROSCAR) 5 MG tablet Take 1 tablet (5 mg total) by mouth daily. 30 tablet 5   furosemide (LASIX) 20 MG tablet TAKE ONE TABLET BY MOUTH EVERY MORNING 30 tablet 0   gabapentin (NEURONTIN) 300 MG capsule TAKE TWO CAPSULES BY MOUTH TWICE DAILY 120 capsule 2   glipiZIDE (GLUCOTROL XL) 10 MG 24 hr tablet Take 1 tablet (10 mg total) by mouth daily with breakfast. 90 tablet 0   indomethacin (INDOCIN) 25 MG capsule Take 1 capsule (25 mg total) by mouth 3 (three) times daily as needed. 21 capsule 1   sacubitril-valsartan (ENTRESTO) 24-26 MG TAKE ONE TABLET BY MOUTH EVERY MORNING and TAKE ONE TABLET BY MOUTH EVERY EVENING 180 tablet 0   tamsulosin (FLOMAX) 0.4 MG CAPS capsule Take 1 capsule (0.4 mg total) by mouth daily. 30 capsule 5   Accu-Chek Softclix Lancets lancets USE AS DIRECTED  TO TEST BLOOD SUGAR ONE TIME DAILY  AND AS NEEDED (Patient not taking: Reported on 07/06/2021) 200 each 2   Alcohol Swabs (B-D SINGLE USE SWABS REGULAR) PADS Use as instructed to clean area for glucose monitoring once daily and as needed.  Diagnosis:  E11.21  Non insulin-dependent (Patient not taking: Reported on 07/06/2021) 100 each 3   Blood Glucose Calibration (ACCU-CHEK AVIVA) SOLN Use to calibrate blood glucose machine as recommended.  Diagnosis:  E11.21  Non-insulin dependent. (Patient not taking: Reported on 07/06/2021) 1 each 2   Blood Glucose Monitoring Suppl (ACCU-CHEK GUIDE) w/Device KIT Use to check blood sugar once daily. (Patient not taking: Reported on 07/06/2021) 1 kit 0   glucose blood (ACCU-CHEK AVIVA PLUS) test strip Test Blood Sugar 1 time daily: Dx:E11.27 (Patient not taking: Reported on 07/06/2021) 100 each 3   No current facility-administered medications for this visit.     Allergies:   Patient has no known allergies.   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   Desires CPR, does want life support if not futile. 05/30/2020- discussed, patient would want CPR/ intubation. Does not have HCPOA/ living will      Social Determinants of Health   Financial Resource Strain: Not on file  Food Insecurity: Not on file  Transportation Needs: No Transportation Needs   Lack of Transportation (Medical): No   Lack of Transportation (Non-Medical): No  Physical Activity: Not on file  Stress: Not on file  Social Connections: Not on file  Intimate Partner Violence: Not on file    Family History: Family History  Problem Relation Age of Onset   Heart attack Father 30   Diabetes Brother     Review of Systems: All other systems reviewed and are otherwise negative except as noted above.   Physical Exam: Vitals:   07/06/21 1033  BP: (!) 110/50  Pulse: 60  SpO2: 96%  Weight: 189 lb (85.7 kg)  Height: $Remove'5\' 8"'ihhXEdu$  (1.727 [name])     GEN- The patient is well appearing, alert and oriented x 3 today.   HEENT: normocephalic, atraumatic; sclera clear, conjunctiva pink; hearing intact; oropharynx  clear; neck supple, no JVP Lymph- no cervical lymphadenopathy Lungs- Clear to ausculation bilaterally, normal work of breathing.  No wheezes, rales, rhonchi Heart- Regular rate and rhythm, no murmurs, rubs or gallops, PMI not laterally displaced GI- soft, non-tender, non-distended, bowel sounds present, no hepatosplenomegaly Extremities- no clubbing or cyanosis. No edema; DP/PT/radial pulses 2+ bilaterally MS- no significant deformity or atrophy Skin- warm and dry, no rash or lesion; ICD pocket well healed Psych- euthymic mood, full affect Neuro- strength and sensation are intact  ICD interrogation- reviewed in detail today,  See PACEART report  EKG:  EKG is ordered today. Personal review of EKG ordered today shows NSR at 60 bpm  Recent Labs: 03/10/2021: TSH 1.45 06/29/2021: ALT 7; BUN 18; Creatinine, Ser 1.66; Hemoglobin 11.4; Platelets 204.0; Potassium 3.8; Sodium 137   Wt Readings from Last 3 Encounters:  07/06/21 189 lb (85.7 kg)  06/29/21 184 lb 2 oz (83.5 kg)  03/10/21 182 lb 8 oz (82.8 kg)     Other studies Reviewed: Additional studies/ records that were reviewed today include: Previous EP office notes.   Assessment and Plan:  1.  Chronic systolic dysfunction s/p St. Jude dual chamber ICD  euvolemic today Stable on an appropriate medical regimen Normal ICD function See Pace Art report RF Telemetry had been turned off due to excessive use. It's possible his monitor at home has come unplugged. His son will locate it and hook it back up. If it cannot be located or isn't functioning, we will order a new one in the setting of VT as below.   2. NSVT/VT Labs 06/2021 reviewed and stable.  He had VT with appropriate therapy on 10/29, likely in setting of medical non-compliance His son states he misses multiple doses of medications several times a week, typically his morning doses.  Will add back amiodarone 200 mg daily so it can be in his system even on days where he may miss a  dose.  Increase coreg at this point likely low utility, but consider with further.  Reviewed [place] driving restrictions.   3. PAF Maintaining NSR  with overall low burden of AF Continue eliquis  4. Dementia Started on Aricept 12/2, but waiting on mail order so has not started. Hopefully this will also help with him remembering to take medication.   Current medicines are reviewed at length with the patient today.     Disposition:   Follow up with EP APP in 6-8 weeks for amiodarone surveillance and f/u.     Jacalyn Lefevre, PA-C  07/06/2021 10:52 AM  [name] [address] [place] [address] [phone] (office) [phone] (fax)

## 2021-07-06 ENCOUNTER — Telehealth: Payer: Self-pay | Admitting: Internal Medicine

## 2021-07-06 ENCOUNTER — Encounter: Payer: Self-pay | Admitting: Student

## 2021-07-06 ENCOUNTER — Ambulatory Visit: Payer: Medicare PPO | Admitting: Student

## 2021-07-06 ENCOUNTER — Other Ambulatory Visit: Payer: Self-pay

## 2021-07-06 VITALS — BP 110/50 | HR 60 | Ht 68.0 in | Wt 189.0 lb

## 2021-07-06 DIAGNOSIS — I5022 Chronic systolic (congestive) heart failure: Secondary | ICD-10-CM | POA: Diagnosis not present

## 2021-07-06 DIAGNOSIS — I10 Essential (primary) hypertension: Secondary | ICD-10-CM

## 2021-07-06 DIAGNOSIS — I4729 Other ventricular tachycardia: Secondary | ICD-10-CM | POA: Diagnosis not present

## 2021-07-06 DIAGNOSIS — I472 Ventricular tachycardia, unspecified: Secondary | ICD-10-CM

## 2021-07-06 DIAGNOSIS — I255 Ischemic cardiomyopathy: Secondary | ICD-10-CM

## 2021-07-06 LAB — CUP PACEART INCLINIC DEVICE CHECK
Battery Remaining Longevity: 55 mo
Brady Statistic RA Percent Paced: 0.29 %
Brady Statistic RV Percent Paced: 0.29 %
Date Time Interrogation Session: 20221208110347
HighPow Impedance: 65.25 Ohm
Implantable Lead Implant Date: 20091022
Implantable Lead Implant Date: 20180322
Implantable Lead Location: 753859
Implantable Lead Location: 753860
Implantable Lead Model: 7121
Implantable Pulse Generator Implant Date: 20180322
Lead Channel Impedance Value: 1737.5 Ohm
Lead Channel Impedance Value: 287.5 Ohm
Lead Channel Pacing Threshold Amplitude: 0.75 V
Lead Channel Pacing Threshold Amplitude: 0.75 V
Lead Channel Pacing Threshold Amplitude: 1.25 V
Lead Channel Pacing Threshold Amplitude: 1.25 V
Lead Channel Pacing Threshold Pulse Width: 0.5 ms
Lead Channel Pacing Threshold Pulse Width: 0.5 ms
Lead Channel Pacing Threshold Pulse Width: 1 ms
Lead Channel Pacing Threshold Pulse Width: 1 ms
Lead Channel Sensing Intrinsic Amplitude: 1.5 mV
Lead Channel Sensing Intrinsic Amplitude: 12 mV
Lead Channel Setting Pacing Amplitude: 2 V
Lead Channel Setting Pacing Amplitude: 3 V
Lead Channel Setting Pacing Pulse Width: 0.8 ms
Lead Channel Setting Sensing Sensitivity: 0.5 mV
Pulse Gen Serial Number: 7413245

## 2021-07-06 LAB — CUP PACEART REMOTE DEVICE CHECK
Battery Remaining Longevity: 50 mo
Battery Remaining Percentage: 53 %
Battery Voltage: 2.93 V
Brady Statistic AP VP Percent: 1 %
Brady Statistic AP VS Percent: 4 %
Brady Statistic AS VP Percent: 1 %
Brady Statistic AS VS Percent: 93 %
Brady Statistic RA Percent Paced: 1 %
Brady Statistic RV Percent Paced: 1 %
Date Time Interrogation Session: 20221208121312
HighPow Impedance: 65 Ohm
HighPow Impedance: 66 Ohm
Implantable Lead Implant Date: 20091022
Implantable Lead Implant Date: 20180322
Implantable Lead Location: 753859
Implantable Lead Location: 753860
Implantable Lead Model: 7121
Implantable Pulse Generator Implant Date: 20180322
Lead Channel Impedance Value: 1750 Ohm
Lead Channel Impedance Value: 290 Ohm
Lead Channel Pacing Threshold Amplitude: 0.75 V
Lead Channel Pacing Threshold Amplitude: 1.25 V
Lead Channel Pacing Threshold Pulse Width: 0.5 ms
Lead Channel Pacing Threshold Pulse Width: 1 ms
Lead Channel Sensing Intrinsic Amplitude: 1.5 mV
Lead Channel Sensing Intrinsic Amplitude: 12 mV
Lead Channel Setting Pacing Amplitude: 2 V
Lead Channel Setting Pacing Amplitude: 3 V
Lead Channel Setting Pacing Pulse Width: 0.8 ms
Lead Channel Setting Sensing Sensitivity: 0.5 mV
Pulse Gen Serial Number: 7413245

## 2021-07-06 MED ORDER — CARVEDILOL 3.125 MG PO TABS: 3.1250 mg | ORAL_TABLET | Freq: Two times a day (BID) | ORAL | 3 refills | Status: DC

## 2021-07-06 MED ORDER — AMIODARONE HCL 200 MG PO TABS
200.0000 mg | ORAL_TABLET | Freq: Every day | ORAL | 3 refills | Status: DC
Start: 2021-07-06 — End: 2022-07-31

## 2021-07-06 MED ORDER — ATORVASTATIN CALCIUM 80 MG PO TABS: ORAL_TABLET | ORAL | 3 refills | Status: DC

## 2021-07-06 NOTE — Telephone Encounter (Signed)
Patient's son returned call. I explained packaging system. He will discuss with Jeneen Rinks and make sure patient knows how to use it.  Debbora Dus, PharmD Clinical Pharmacist Practitioner [facility] at [facility] [phone]

## 2021-07-06 NOTE — Addendum Note (Signed)
Addended by: Gaetano Net on: 07/06/2021 01:17 PM   Modules accepted: Orders

## 2021-07-06 NOTE — Telephone Encounter (Signed)
Attempted to reach patient to discuss adherence/pill packaging as pt brought pill bottles to recent visit with PCP 06/29/21. Patient has been receiving deliveries of pill packs from [facility] so I am not sure why he has pill bottles. I need to confirm he is taking his medications appropriately. Left voicemail for return call.  Debbora Dus, PharmD Clinical Pharmacist Practitioner [facility] at [facility] [phone]

## 2021-07-06 NOTE — Telephone Encounter (Signed)
*  STAT* If patient is at the pharmacy, call can be transferred to refill team.   1. Which medications need to be refilled? (please list name of each medication and dose if known)  atorvastatin (LIPITOR) 80 MG tablet carvedilol (COREG) 3.125 MG tablet   2. Which pharmacy/location (including street and city if local pharmacy) is medication to be sent to? [facility] [phone]  5. Do they need a 30 day or 90 day supply? 90 day

## 2021-07-06 NOTE — Patient Instructions (Signed)
Medication Instructions:  Your physician has recommended you make the following change in your medication:   START: Amiodarone 200mg  daily  *If you need a refill on your cardiac medications before your next appointment, please call your pharmacy*   Lab Work: None  If you have labs (blood work) drawn today and your tests are completely normal, you will receive your results only by: [place] (if you have MyChart) OR A paper copy in the mail If you have any lab test that is abnormal or we need to change your treatment, we will call you to review the results.   Follow-Up: At [facility], you and your health needs are our priority.  As part of our continuing mission to provide you with exceptional heart care, we have created designated Provider Care Teams.  These Care Teams include your primary Cardiologist (physician) and Advanced Practice Providers (APPs -  Physician Assistants and Nurse Practitioners) who all work together to provide you with the care you need, when you need it.  We recommend signing up for the patient portal called "MyChart".  Sign up information is provided on this After Visit Summary.  MyChart is used to connect with patients for Virtual Visits (Telemedicine).  Patients are able to view lab/test results, encounter notes, upcoming appointments, etc.  Non-urgent messages can be sent to your provider as well.   To learn more about what you can do with MyChart, go to [email or web address].    Your next appointment:   2 month(s)  The format for your next appointment:   In Person  Provider:   Legrand Como "Lily Kocher    Other [facility] [phone]

## 2021-07-07 ENCOUNTER — Telehealth: Payer: Self-pay

## 2021-07-07 NOTE — Telephone Encounter (Signed)
Pt is requesting refills on furosemide and allopurinol to [facility].

## 2021-07-07 NOTE — Progress Notes (Signed)
EPIC Encounter for ICM Monitoring  Patient Name: Randy [name] is a 75 y.o. male Date: 07/07/2021 Primary Care Physican: Michela Pitcher, NP Primary Cardiologist: Lovena Le Electrophysiologist: Lovena Le 07/05/2021 Weight: 189 lbs   AT/AF Burden: <1% (taking Aspirin)   Spoke with patient and heart failure questions reviewed.  Pt asymptomatic for fluid accumulation and feeling well.   Corvue thoracic impedance suggesting normal fluid levels.   Prescribed: Furosemide 20 mg 1 tablet daily   Labs: 06/29/2021 Creatinine 1.66, BUN , Potassium 3.8, Sodium 137, GFR 40.04 03/10/2021 Creatinine 1.86, BUN 32, Potassium 3.9, Sodium 138, GFR 35 A complete set of results can be found in Results Review.   Recommendations: No changes and encouraged to call if experiencing any fluid symptoms.   Follow-up plan: ICM clinic phone appointment on 08/07/2021.  91 day device clinic remote transmission scheduled for 10/03/2021.    EP/Cardiology Office Visits: 09/13/2021 with Oda Kilts, PA.     Copy of ICM check sent to Dr. Lovena Le.    3 month ICM trend: 07/06/2021.    12-14 Month ICM trend:       Rosalene Billings, RN 07/07/2021 12:55 PM

## 2021-07-07 NOTE — Telephone Encounter (Signed)
Contacted patient's son, he confirms pt to continue using [facility] only. States patient gets confused and agrees to things unknowingly. This call was prompted due to medication refills recently sent to mail order pharmacy [facility]) from cardiology.Marland Kitchen

## 2021-07-08 ENCOUNTER — Other Ambulatory Visit: Payer: Self-pay | Admitting: Nurse Practitioner

## 2021-07-08 DIAGNOSIS — E790 Hyperuricemia without signs of inflammatory arthritis and tophaceous disease: Secondary | ICD-10-CM

## 2021-07-08 MED ORDER — ALLOPURINOL 100 MG PO TABS: 50.0000 mg | ORAL_TABLET | Freq: Every day | ORAL | 2 refills | Status: DC

## 2021-07-08 MED ORDER — FUROSEMIDE 20 MG PO TABS: 20.0000 mg | ORAL_TABLET | Freq: Every morning | ORAL | 2 refills | Status: DC

## 2021-07-08 NOTE — Telephone Encounter (Signed)
Refills sent to [facility] 

## 2021-07-13 NOTE — Progress Notes (Signed)
Remote ICD transmission.   

## 2021-07-14 ENCOUNTER — Ambulatory Visit (INDEPENDENT_AMBULATORY_CARE_PROVIDER_SITE_OTHER): Payer: Medicare PPO | Admitting: *Deleted

## 2021-07-14 DIAGNOSIS — I10 Essential (primary) hypertension: Secondary | ICD-10-CM

## 2021-07-14 DIAGNOSIS — G3184 Mild cognitive impairment, so stated: Secondary | ICD-10-CM

## 2021-07-14 DIAGNOSIS — E1121 Type 2 diabetes mellitus with diabetic nephropathy: Secondary | ICD-10-CM

## 2021-07-14 NOTE — Patient Instructions (Signed)
Visit Information  Thank you for taking time to visit with me today. Please don't hesitate to contact me if I can be of assistance to you before our next scheduled telephone appointment.  Following are the goals we discussed today:  (Copy and paste patient goals from clinical care plan here)  Our next appointment is by telephone on 08/10/21 at 2pm  Please call the care guide team at [phone] if you need to cancel or reschedule your appointment.   If you are experiencing a Mental Health or [place] or need someone to talk to, please call the [place] National Suicide Prevention Lifeline: (703)230-2213 or TTY: [phone] TTY [phone]) to talk to a trained counselor go to [facility] [address] [place] [id number]) call 911   The patient verbalized understanding of instructions, educational materials, and care plan provided today and declined offer to receive copy of patient instructions, educational materials, and care plan.   Eduard Clos MSW, LCSW Licensed Clinical Social Worker [place] [phone]

## 2021-07-14 NOTE — Chronic Care Management (AMB) (Signed)
Chronic Care Management    Clinical Social Work Note  07/14/2021 Name: Randy [name] MRN: [id number] DOB: [birth year]  Randy [name] is a 75 y.o. year old male who is a primary care patient of Jmari, Pelc, NP. The CCM team was consulted to assist the patient with chronic disease management and/or care coordination needs related to: [employer] , Level of Care Concerns, and Caregiver Stress.   Engaged with patient by telephone for follow up visit in response to provider referral for social work chronic care management and care coordination services.   Consent to Services:  The patient was given information about Chronic Care Management services, agreed to services, and gave verbal consent prior to initiation of services.  Please see initial visit note for detailed documentation.   Patient agreed to services and consent obtained.   Assessment: Review of patient past medical history, allergies, medications, and health status, including review of relevant consultants reports was performed today as part of a comprehensive evaluation and provision of chronic care management and care coordination services.     SDOH (Social Determinants of Health) assessments and interventions performed:  SDOH Interventions    Flowsheet Row Most Recent Value  SDOH Interventions   Housing Interventions Intervention Not Indicated        Advanced Directives Status: Not addressed in this encounter.  CCM Care Plan  No Known Allergies  Outpatient Encounter Medications as of 07/14/2021  Medication Sig   Accu-Chek Softclix Lancets lancets USE AS DIRECTED  TO TEST BLOOD SUGAR ONE TIME DAILY  AND AS NEEDED (Patient not taking: Reported on 07/06/2021)   Alcohol Swabs (B-D SINGLE USE SWABS REGULAR) PADS Use as instructed to clean area for glucose monitoring once daily and as needed.  Diagnosis:  E11.21  Non insulin-dependent (Patient not taking: Reported on 07/06/2021)   allopurinol (ZYLOPRIM) 100 MG  tablet Take 0.5 tablets (50 mg total) by mouth daily.   amiodarone (PACERONE) 200 MG tablet Take 1 tablet (200 mg total) by mouth daily.   apixaban (ELIQUIS) 5 MG TABS tablet Take 1 tablet (5 mg total) by mouth 2 (two) times daily.   atorvastatin (LIPITOR) 80 MG tablet TAKE 1 TABLET (80 MG TOTAL) DAILY AT 6 PM.   Blood Glucose Calibration (ACCU-CHEK AVIVA) SOLN Use to calibrate blood glucose machine as recommended.  Diagnosis:  E11.21  Non-insulin dependent. (Patient not taking: Reported on 07/06/2021)   Blood Glucose Monitoring Suppl (ACCU-CHEK GUIDE) w/Device KIT Use to check blood sugar once daily. (Patient not taking: Reported on 07/06/2021)   carvedilol (COREG) 3.125 MG tablet Take 1 tablet (3.125 mg total) by mouth 2 (two) times daily.   donepezil (ARICEPT) 5 MG tablet Take 1 tablet (5 mg total) by mouth at bedtime.   ezetimibe (ZETIA) 10 MG tablet Take 1 tablet (10 mg total) by mouth daily.   finasteride (PROSCAR) 5 MG tablet Take 1 tablet (5 mg total) by mouth daily.   furosemide (LASIX) 20 MG tablet Take 1 tablet (20 mg total) by mouth every morning.   gabapentin (NEURONTIN) 300 MG capsule TAKE TWO CAPSULES BY MOUTH TWICE DAILY   glipiZIDE (GLUCOTROL XL) 10 MG 24 hr tablet Take 1 tablet (10 mg total) by mouth daily with breakfast.   glucose blood (ACCU-CHEK AVIVA PLUS) test strip Test Blood Sugar 1 time daily: Dx:E11.27 (Patient not taking: Reported on 07/06/2021)   indomethacin (INDOCIN) 25 MG capsule Take 1 capsule (25 mg total) by mouth 3 (three) times daily as needed.  sacubitril-valsartan (ENTRESTO) 24-26 MG TAKE ONE TABLET BY MOUTH EVERY MORNING and TAKE ONE TABLET BY MOUTH EVERY EVENING   tamsulosin (FLOMAX) 0.4 MG CAPS capsule Take 1 capsule (0.4 mg total) by mouth daily.   No facility-administered encounter medications on file as of 07/14/2021.    Patient Active Problem List   Diagnosis Date Noted   Mild cognitive impairment 03/10/2021   Mass of right foot 03/10/2021    Idiopathic gout of multiple sites 10/07/2018   Grief 06/16/2018   Dizziness 06/16/2018   Allergic rhinitis 04/24/2018   S/P CABG x 3 09/02/2017   Leukocytosis 08/27/2017   Visit for well man health check 05/07/2017   COPD (chronic obstructive pulmonary disease) ([place]) 14/48/1856   Chronic systolic CHF (congestive heart failure) ([place]) 10/18/2016   Generalized OA 06/22/2014   CKD (chronic kidney disease) stage 3, GFR 30-59 ml/min (HCC)    Synovial cyst of lumbar spine 07/17/2013    Class: Diagnosis of   Osteoarthritis of left hip 09/17/2012   Automatic implantable cardioverter-defibrillator in situ - St Jude 31/49/7026   Chronic systolic heart failure ([place]) 05/01/2011   IMPOTENCE OF ORGANIC ORIGIN 02/20/2010   Vitamin D deficiency 02/14/2010   Type 2 diabetes with nephropathy ([place]) 01/11/2010   Implantable cardioverter-defibrillator (ICD) in situ 05/10/2009   HLD (hyperlipidemia) 02/24/2008   Essential hypertension 02/24/2008   Non-ST elevation (NSTEMI) myocardial infarction ([place]) 02/24/2008   HYPERSOMNIA UNSPECIFIED 02/24/2008    Conditions to be addressed/monitored: Dementia; Limited social support, Level of care concerns, Limited access to caregiver, and Cognitive Deficits  Care Plan : LCSW Plan of Care  Updates made by [name], LCSW since 07/14/2021 12:00 AM     Problem: Mobility and Independence      Long-Range Goal: Mobility and Independence Optimized   Start Date: 05/24/2021  Expected End Date: 08/28/2021  This Visit's Progress: On track  Recent Progress: On track  Priority: High  Note:   Current barriers:   Patient in need of assistance with connecting to community resources for Limited social support, Level of care concerns, and Cognitive Deficits Acknowledges deficits with meeting this unmet need Patient is unable to independently navigate community resource options without care coordination support Clinical Goals:  patient will demonstrate improved  health management independence as evidenced byfamily report, overall activity/outcomes Clinical Interventions:  Son reports pt is dong better- "he did better than I expected for the cognitive testing".  He has a new medication through [facility] per son and things are overall good.  Son reports pt is driving to [facility] and doing things more appropriately and is pleased to see this.  Collaboration with Michela Pitcher, NP regarding development and update of comprehensive plan of care as evidenced by provider attestation and co-signature Inter-disciplinary care team collaboration (see longitudinal plan of care) Assessment of needs, barriers , agencies contacted, as well as how impacting  Review various resources, discussed options and provided patient information about Limited social support, Level of care concerns, Social Isolation, and Memory Deficits Collaborated with appropriate clinical care team members regarding patient needs Memory Deficits, Dementia Referral placed via [facility] Patient interviewed and appropriate assessments performed Other interventions provided: Reviewed mental health medications with patient and discussed importance of compliance:  Increase in actives / exercise encouraged  Patient Goals:  - continue taking medications as prescribed Follow Up Plan: SW will follow up with patient by phone over the next 30 days        Follow Up Plan: Appointment scheduled  for SW follow up with client by phone on: 08/10/21      Eduard Clos MSW, Industry Licensed Clinical Social Worker [place] [phone]

## 2021-07-29 DIAGNOSIS — I10 Essential (primary) hypertension: Secondary | ICD-10-CM

## 2021-07-29 DIAGNOSIS — E1121 Type 2 diabetes mellitus with diabetic nephropathy: Secondary | ICD-10-CM

## 2021-08-07 ENCOUNTER — Ambulatory Visit (INDEPENDENT_AMBULATORY_CARE_PROVIDER_SITE_OTHER): Payer: Medicare Other

## 2021-08-07 ENCOUNTER — Other Ambulatory Visit: Payer: Self-pay

## 2021-08-07 ENCOUNTER — Ambulatory Visit (INDEPENDENT_AMBULATORY_CARE_PROVIDER_SITE_OTHER): Payer: Medicare PPO

## 2021-08-07 ENCOUNTER — Ambulatory Visit: Payer: Medicare Other | Admitting: Podiatry

## 2021-08-07 DIAGNOSIS — Z95810 Presence of automatic (implantable) cardiac defibrillator: Secondary | ICD-10-CM

## 2021-08-07 DIAGNOSIS — M79674 Pain in right toe(s): Secondary | ICD-10-CM | POA: Diagnosis not present

## 2021-08-07 DIAGNOSIS — B351 Tinea unguium: Secondary | ICD-10-CM | POA: Diagnosis not present

## 2021-08-07 DIAGNOSIS — M79671 Pain in right foot: Secondary | ICD-10-CM | POA: Diagnosis not present

## 2021-08-07 DIAGNOSIS — M6740 Ganglion, unspecified site: Secondary | ICD-10-CM | POA: Diagnosis not present

## 2021-08-07 DIAGNOSIS — M79675 Pain in left toe(s): Secondary | ICD-10-CM | POA: Diagnosis not present

## 2021-08-07 DIAGNOSIS — Z7901 Long term (current) use of anticoagulants: Secondary | ICD-10-CM

## 2021-08-07 DIAGNOSIS — L723 Sebaceous cyst: Secondary | ICD-10-CM

## 2021-08-07 DIAGNOSIS — M7989 Other specified soft tissue disorders: Secondary | ICD-10-CM | POA: Diagnosis not present

## 2021-08-07 DIAGNOSIS — I5022 Chronic systolic (congestive) heart failure: Secondary | ICD-10-CM

## 2021-08-07 NOTE — Progress Notes (Signed)
Cy0

## 2021-08-08 ENCOUNTER — Telehealth: Payer: Self-pay

## 2021-08-08 ENCOUNTER — Telehealth: Payer: Self-pay | Admitting: *Deleted

## 2021-08-08 NOTE — Progress Notes (Signed)
EPIC Encounter for ICM Monitoring  Patient Name: Randy [name] is a 76 y.o. male Date: 08/08/2021 Primary Care Physican: Michela Pitcher, NP Primary Cardiologist: Lovena Le Electrophysiologist: Lovena Le 07/05/2021 Weight: 189 lbs   08/07/2021 Report:       AT/AF Burden: 9.2%  07/21/2021 Report:  AT/AF Burden: 16% 07/06/2021 Report:     AT/AF Burden: 0%    Attempted call to patient and unable to reach.   Transmission reviewed.    Corvue thoracic impedance suggesting normal fluid levels.   Message sent 1/10 to device clinic triage regarding increase burden.    Prescribed: Furosemide 20 mg 1 tablet daily   Labs: 06/29/2021 Creatinine 1.66, BUN , Potassium 3.8, Sodium 137, GFR 40.04 03/10/2021 Creatinine 1.86, BUN 32, Potassium 3.9, Sodium 138, GFR 35 A complete set of results can be found in Results Review.   Recommendations: Unable to reach.     Follow-up plan: ICM clinic phone appointment on 09/11/2021.  91 day device clinic remote transmission scheduled for 10/03/2021.    EP/Cardiology Office Visits: 09/13/2021 with Oda Kilts, PA.     Copy of ICM check sent to Dr. Lovena Le.    3 month ICM trend: 08/07/2021.    12-14 Month ICM trend:     Rosalene Billings, RN 08/08/2021 3:09 PM

## 2021-08-08 NOTE — Telephone Encounter (Signed)
Remote ICM transmission received.  Attempted call to patient regarding ICM remote transmission and no answer or voice mail option.  

## 2021-08-08 NOTE — Telephone Encounter (Addendum)
Ivin Booty w/ [facility] is calling [phone] clarify what the physician is looking for because the specimen came in a syringe ,is a thicker substance than normally sent,did he asperate the cyst?Does he want a biopsy done?Please advise.  Called and spoke with Ivin Booty to inform that the doctor wanted the specimen biopsied ,possible ganglion cyst.She said that they will call back if any problems.

## 2021-08-09 NOTE — Telephone Encounter (Signed)
Called [facility] and gave source of the substance submitted, spoke with Ivin Booty and she said that she was going to re submit cytology non gyn,not pathology as originally stated.  Not sure if they will be able to analyze since the substance came in a syringe as a gel, not liquefied or a solid tissue in formalin. Was it done on the same day that it was shipped. Please advise.

## 2021-08-09 NOTE — Progress Notes (Signed)
Received: Ramond Dial, Clois Dupes, RN  Analleli Gierke Panda, RN Will continue to monitor. Appt with Jonni Sanger in Feb. On [facility] and presenting rhythm is sinus.

## 2021-08-09 NOTE — Telephone Encounter (Signed)
Ivin Booty w/ [facility] is calling for the source of the substance submitted. Please advise, no  notes from the visit in epic.

## 2021-08-10 ENCOUNTER — Ambulatory Visit (INDEPENDENT_AMBULATORY_CARE_PROVIDER_SITE_OTHER): Payer: Medicare Other | Admitting: *Deleted

## 2021-08-10 DIAGNOSIS — G3184 Mild cognitive impairment, so stated: Secondary | ICD-10-CM

## 2021-08-10 DIAGNOSIS — E1121 Type 2 diabetes mellitus with diabetic nephropathy: Secondary | ICD-10-CM

## 2021-08-10 NOTE — Patient Instructions (Signed)
Visit Information  Thank you for taking time to visit with me today. Please don't hesitate to contact me if I can be of assistance to you before our next scheduled telephone appointment.  Our next appointment is by telephone on 09/08/21 at 3:pm  Please call the care guide team at [phone] if you need to cancel or reschedule your appointment.   If you are experiencing a Mental Health or [place] or need someone to talk to, please call the Suicide and Crisis Lifeline: 988 call 911   The patient verbalized understanding of instructions, educational materials, and care plan provided today and declined offer to receive copy of patient instructions, educational materials, and care plan.   Eduard Clos MSW, LCSW Licensed Clinical Social Worker [facility] [phone]

## 2021-08-10 NOTE — Chronic Care Management (AMB) (Signed)
Chronic Care Management    Clinical Social Work Note  08/10/2021 Name: Randy [name] MRN: [id number] DOB: 1946/02/03  Randy [name] is a 76 y.o. year old male who is a primary care patient of Joffre, Lucks, NP. The CCM team was consulted to assist the patient with chronic disease management and/or care coordination needs related to: Level of Care Concerns and Caregiver Stress.   Engaged with patient by telephone for follow up visit in response to provider referral for social work chronic care management and care coordination services.   Consent to Services:  The patient was given information about Chronic Care Management services, agreed to services, and gave verbal consent prior to initiation of services.  Please see initial visit note for detailed documentation.   Patient agreed to services and consent obtained.   Assessment: Review of patient past medical history, allergies, medications, and health status, including review of relevant consultants reports was performed today as part of a comprehensive evaluation and provision of chronic care management and care coordination services.     SDOH (Social Determinants of Health) assessments and interventions performed:    Advanced Directives Status: Not addressed in this encounter.  CCM Care Plan  No Known Allergies  Outpatient Encounter Medications as of 08/10/2021  Medication Sig   Accu-Chek Softclix Lancets lancets USE AS DIRECTED  TO TEST BLOOD SUGAR ONE TIME DAILY  AND AS NEEDED (Patient not taking: Reported on 07/06/2021)   Alcohol Swabs (B-D SINGLE USE SWABS REGULAR) PADS Use as instructed to clean area for glucose monitoring once daily and as needed.  Diagnosis:  E11.21  Non insulin-dependent (Patient not taking: Reported on 07/06/2021)   allopurinol (ZYLOPRIM) 100 MG tablet Take 0.5 tablets (50 mg total) by mouth daily.   amiodarone (PACERONE) 200 MG tablet Take 1 tablet (200 mg total) by mouth daily.   apixaban (ELIQUIS) 5 MG  TABS tablet Take 1 tablet (5 mg total) by mouth 2 (two) times daily.   atorvastatin (LIPITOR) 80 MG tablet TAKE 1 TABLET (80 MG TOTAL) DAILY AT 6 PM.   Blood Glucose Calibration (ACCU-CHEK AVIVA) SOLN Use to calibrate blood glucose machine as recommended.  Diagnosis:  E11.21  Non-insulin dependent. (Patient not taking: Reported on 07/06/2021)   Blood Glucose Monitoring Suppl (ACCU-CHEK GUIDE) w/Device KIT Use to check blood sugar once daily. (Patient not taking: Reported on 07/06/2021)   carvedilol (COREG) 3.125 MG tablet Take 1 tablet (3.125 mg total) by mouth 2 (two) times daily.   donepezil (ARICEPT) 5 MG tablet Take 1 tablet (5 mg total) by mouth at bedtime.   ezetimibe (ZETIA) 10 MG tablet Take 1 tablet (10 mg total) by mouth daily.   finasteride (PROSCAR) 5 MG tablet Take 1 tablet (5 mg total) by mouth daily.   furosemide (LASIX) 20 MG tablet Take 1 tablet (20 mg total) by mouth every morning.   gabapentin (NEURONTIN) 300 MG capsule TAKE TWO CAPSULES BY MOUTH TWICE DAILY   glipiZIDE (GLUCOTROL XL) 10 MG 24 hr tablet Take 1 tablet (10 mg total) by mouth daily with breakfast.   glucose blood (ACCU-CHEK AVIVA PLUS) test strip Test Blood Sugar 1 time daily: Dx:E11.27 (Patient not taking: Reported on 07/06/2021)   indomethacin (INDOCIN) 25 MG capsule Take 1 capsule (25 mg total) by mouth 3 (three) times daily as needed.   sacubitril-valsartan (ENTRESTO) 24-26 MG TAKE ONE TABLET BY MOUTH EVERY MORNING and TAKE ONE TABLET BY MOUTH EVERY EVENING   tamsulosin (FLOMAX) 0.4 MG CAPS capsule Take  1 capsule (0.4 mg total) by mouth daily.   No facility-administered encounter medications on file as of 08/10/2021.    Patient Active Problem List   Diagnosis Date Noted   Mild cognitive impairment 03/10/2021   Mass of right foot 03/10/2021   Idiopathic gout of multiple sites 10/07/2018   Grief 06/16/2018   Dizziness 06/16/2018   Allergic rhinitis 04/24/2018   S/P CABG x 3 09/02/2017   Leukocytosis  08/27/2017   Visit for well man health check 05/07/2017   COPD (chronic obstructive pulmonary disease) ([place]) 58/83/2549   Chronic systolic CHF (congestive heart failure) ([place]) 10/18/2016   Generalized OA 06/22/2014   CKD (chronic kidney disease) stage 3, GFR 30-59 ml/min (HCC)    Synovial cyst of lumbar spine 07/17/2013    Class: Diagnosis of   Osteoarthritis of left hip 09/17/2012   Automatic implantable cardioverter-defibrillator in situ - St Jude 82/64/1583   Chronic systolic heart failure ([place]) 05/01/2011   IMPOTENCE OF ORGANIC ORIGIN 02/20/2010   Vitamin D deficiency 02/14/2010   Type 2 diabetes with nephropathy ([place]) 01/11/2010   Implantable cardioverter-defibrillator (ICD) in situ 05/10/2009   HLD (hyperlipidemia) 02/24/2008   Essential hypertension 02/24/2008   Non-ST elevation (NSTEMI) myocardial infarction ([place]) 02/24/2008   HYPERSOMNIA UNSPECIFIED 02/24/2008    Conditions to be addressed/monitored: DMII and dementia ; Level of care concerns and Limited access to caregiver  Care Plan : LCSW Plan of Care  Updates made by [name], LCSW since 08/10/2021 12:00 AM     Problem: Mobility and Independence      Long-Range Goal: Mobility and Independence Optimized   Start Date: 05/24/2021  Expected End Date: 09/26/2021  This Visit's Progress: On track  Recent Progress: On track  Priority: High  Note:   Current barriers:   Patient in need of assistance with connecting to community resources for Limited social support, Level of care concerns, and Cognitive Deficits Acknowledges deficits with meeting this unmet need Patient is unable to independently navigate community resource options without care coordination support Clinical Goals:  patient will demonstrate improved health management independence as evidenced byfamily report, overall activity/outcomes Clinical Interventions:  Son reports pt is dong better- thinks the med packs (separating them into AM/PM etc is  helping.  He also shared he took pt to a Podiatry visit and they are looking at a cyst on his foot because of his DM to make sure it's ok.  Son denies any concerns at this time- CSW will follow up again in about 4 weeks to further assess.   Collaboration with Michela Pitcher, NP regarding development and update of comprehensive plan of care as evidenced by provider attestation and co-signature Inter-disciplinary care team collaboration (see longitudinal plan of care) Assessment of needs, barriers , agencies contacted, as well as how impacting  Review various resources, discussed options and provided patient information about Limited social support, Level of care concerns, Social Isolation, and Memory Deficits Collaborated with appropriate clinical care team members regarding patient needs Memory Deficits, Dementia Referral placed via [place] [facility] Patient interviewed and appropriate assessments performed Other interventions provided: Reviewed mental health medications with patient and discussed importance of compliance:  Increase in actives / exercise encouraged  Patient Goals:  - continue taking medications as prescribed Follow Up Plan: SW will follow up with patient by phone over the next 30 days        Follow Up Plan: Appointment scheduled for SW follow up with client by phone on: 09/08/21  Eduard Clos MSW, LCSW Licensed Clinical Social Worker [place] [phone]

## 2021-08-11 LAB — PATHOLOGY REPORT

## 2021-08-11 LAB — TISSUE SPECIMEN

## 2021-08-13 NOTE — Progress Notes (Signed)
Subjective:   Patient ID: Randy [name], male   DOB: 76 y.o.   MRN: [id number]   HPI 76 year old male presents the office today for concerns of thick, elongated toenails that he cannot trim himself.  He has not trim them in quite some time.  He also has a large mass on the top of his right foot that is been present for about 10 years.  Comes occasional discomfort.  No recent treatment.  No injuries.   Review of Systems  All other systems reviewed and are negative.  Past Medical History:  Diagnosis Date   AICD (automatic cardioverter/defibrillator) present 10/18/2016   "replaced 2009-St. Jude device, Dr Lovena Le"   Anemia    Arthritis    "back" (08/27/2017)   CAD (coronary artery disease)    CHF (congestive heart failure) (HCC)    CKD (chronic kidney disease) stage 3, GFR 30-59 ml/min (HCC)    [name]   Diverticulosis    Dyslipidemia    GERD (gastroesophageal reflux disease)    Hypertension    Neuromuscular disorder (HCC)    neuropathy- in hands   Osteoarthritis    hips, hands   Pacemaker    Shortness of breath    Sleep apnea    started a sleep study but didn't completed, states PCP told him he has sleep  apnea, doesn't use CPAP   Systolic dysfunction    Type 2 diabetes with nephropathy (HCC)    Vitamin D deficiency     Past Surgical History:  Procedure Laterality Date   ANTERIOR CERVICAL DECOMP/DISCECTOMY FUSION     BACK SURGERY     CARPAL TUNNEL RELEASE Left    CORONARY ARTERY BYPASS GRAFT N/A 09/02/2017   Procedure: CORONARY ARTERY BYPASS GRAFTING (CABG) x three , using left internal mammary artery and right leg greater saphenous vein harvested endoscopically;  Surgeon: [name], MD;  Location: [place] OR;  Service: Open Heart Surgery;  Laterality: N/A;   CYST EXCISION     "back"   ICD IMPLANT N/A 10/18/2016   Procedure: ICD Implant- Upgrade to Dual Chamber;  Surgeon: Evans Lance, MD;  Location: [place] [facility];  Service: Cardiovascular;  Laterality: N/A;    INSERT / REPLACE / REMOVE PACEMAKER     JOINT REPLACEMENT     LEFT HEART CATH AND CORONARY ANGIOGRAPHY N/A 08/30/2017   Procedure: LEFT HEART CATH AND CORONARY ANGIOGRAPHY;  Surgeon: Martinique, Peter M, MD;  Location: [place] [facility];  Service: Cardiovascular;  Laterality: N/A;   LUMBAR LAMINECTOMY Left 07/17/2013   Procedure: MICRODISCECTOMY LUMBAR LAMINECTOMY;  Surgeon: Marybelle Killings, MD;  Location: [place];  Service: Orthopedics;  Laterality: Left;  Left L4 Laminotomy, Lateral Recess Decompression, Cyst Excision   SHOULDER OPEN ROTATOR CUFF REPAIR Left    TEE WITHOUT CARDIOVERSION N/A 09/02/2017   Procedure: TRANSESOPHAGEAL ECHOCARDIOGRAM (TEE);  Surgeon: [name], MD;  Location: [place];  Service: Open Heart Surgery;  Laterality: N/A;   TOTAL HIP ARTHROPLASTY Left 09/17/2012   Procedure: Left TOTAL HIP ARTHROPLASTY ANTERIOR APPROACH;  Surgeon: Marybelle Killings, MD;  Location: [place];  Service: Orthopedics;  Laterality: Left;     Current Outpatient Medications:    Accu-Chek Softclix Lancets lancets, USE AS DIRECTED  TO TEST BLOOD SUGAR ONE TIME DAILY  AND AS NEEDED (Patient not taking: Reported on 07/06/2021), Disp: 200 each, Rfl: 2   Alcohol Swabs (B-D SINGLE USE SWABS REGULAR) PADS, Use as instructed to clean area for glucose monitoring once daily  and as needed.  Diagnosis:  E11.21  Non insulin-dependent (Patient not taking: Reported on 07/06/2021), Disp: 100 each, Rfl: 3   allopurinol (ZYLOPRIM) 100 MG tablet, Take 0.5 tablets (50 mg total) by mouth daily., Disp: 15 tablet, Rfl: 2   amiodarone (PACERONE) 200 MG tablet, Take 1 tablet (200 mg total) by mouth daily., Disp: 90 tablet, Rfl: 3   apixaban (ELIQUIS) 5 MG TABS tablet, Take 1 tablet (5 mg total) by mouth 2 (two) times daily., Disp: 60 tablet, Rfl: 11   atorvastatin (LIPITOR) 80 MG tablet, TAKE 1 TABLET (80 MG TOTAL) DAILY AT 6 PM., Disp: 90 tablet, Rfl: 3   Blood Glucose Calibration (ACCU-CHEK AVIVA) SOLN, Use to calibrate blood glucose  machine as recommended.  Diagnosis:  E11.21  Non-insulin dependent. (Patient not taking: Reported on 07/06/2021), Disp: 1 each, Rfl: 2   Blood Glucose Monitoring Suppl (ACCU-CHEK GUIDE) w/Device KIT, Use to check blood sugar once daily. (Patient not taking: Reported on 07/06/2021), Disp: 1 kit, Rfl: 0   carvedilol (COREG) 3.125 MG tablet, Take 1 tablet (3.125 mg total) by mouth 2 (two) times daily., Disp: 60 tablet, Rfl: 3   donepezil (ARICEPT) 5 MG tablet, Take 1 tablet (5 mg total) by mouth at bedtime., Disp: 90 tablet, Rfl: 0   ezetimibe (ZETIA) 10 MG tablet, Take 1 tablet (10 mg total) by mouth daily., Disp: 90 tablet, Rfl: 0   finasteride (PROSCAR) 5 MG tablet, Take 1 tablet (5 mg total) by mouth daily., Disp: 30 tablet, Rfl: 5   furosemide (LASIX) 20 MG tablet, Take 1 tablet (20 mg total) by mouth every morning., Disp: 30 tablet, Rfl: 2   gabapentin (NEURONTIN) 300 MG capsule, TAKE TWO CAPSULES BY MOUTH TWICE DAILY, Disp: 120 capsule, Rfl: 2   glipiZIDE (GLUCOTROL XL) 10 MG 24 hr tablet, Take 1 tablet (10 mg total) by mouth daily with breakfast., Disp: 90 tablet, Rfl: 0   glucose blood (ACCU-CHEK AVIVA PLUS) test strip, Test Blood Sugar 1 time daily: Dx:E11.27 (Patient not taking: Reported on 07/06/2021), Disp: 100 each, Rfl: 3   indomethacin (INDOCIN) 25 MG capsule, Take 1 capsule (25 mg total) by mouth 3 (three) times daily as needed., Disp: 21 capsule, Rfl: 1   sacubitril-valsartan (ENTRESTO) 24-26 MG, TAKE ONE TABLET BY MOUTH EVERY MORNING and TAKE ONE TABLET BY MOUTH EVERY EVENING, Disp: 180 tablet, Rfl: 0   tamsulosin (FLOMAX) 0.4 MG CAPS capsule, Take 1 capsule (0.4 mg total) by mouth daily., Disp: 30 capsule, Rfl: 5  No Known Allergies        Objective:  Physical Exam  General: AAO x3, NAD  Dermatological: Nails are hypertrophic, dystrophic, brittle, discolored, elongated 10. No surrounding redness or drainage. Tenderness nails 1-5 bilaterally. No open lesions or pre-ulcerative  lesions are identified today  Vascular: Dorsalis Pedis artery and Posterior Tibial artery pedal pulses are 2/4 bilateral with immedate capillary fill time. There is no pain with calf compression, swelling, warmth, erythema.   Neruologic: Grossly intact via light touch bilateral.   Musculoskeletal: On the dorsal aspect of right foot is a large fluid-filled cyst.  Appears to be a large ganglion cyst on clinical exam.  Muscular strength 5/5 in all groups tested bilateral.  Bunion present.  Gait: Unassisted, Nonantalgic.       Assessment:   76 year old male with symptomatic onychomycosis, large soft tissue mass right foot     Plan:  -Treatment options discussed including all alternatives, risks, and complications -Etiology of symptoms were discussed -X-rays were  obtained and reviewed with the patient.  No evidence of acute fracture or stress fracture.  No calcifications present.  Soft tissue edema present along the area of the mass.  Bunion present. -Vessel calcification of x-ray however he does have palpable pulses. -Debrided nails x10 without any complications or bleeding -For the large mass on the top of the right foot I discussed with him aspiration, biopsy of the mass.  Discussing risks he wants to pursue this in the office today if not able to do this discussed with him possible MRI and surgical excision.  Not on any pain currently but is quite large.  I cleaned the skin with alcohol and anesthetized the area in a regional block with 3 cc of lidocaine, Marcaine plain.  Once Cystic acne the skin with Betadine, alcohol and utilized 18-gauge needle to aspirate thick jellylike fluid.  I was able to remove about half the mass.  I did send this for pathology.  If needed consider MRI, excision.  Trula Slade DPM

## 2021-08-24 ENCOUNTER — Telehealth: Payer: Self-pay

## 2021-08-24 ENCOUNTER — Other Ambulatory Visit: Payer: Self-pay

## 2021-08-24 DIAGNOSIS — E1121 Type 2 diabetes mellitus with diabetic nephropathy: Secondary | ICD-10-CM

## 2021-08-24 MED ORDER — TAMSULOSIN HCL 0.4 MG PO CAPS: 0.4000 mg | ORAL_CAPSULE | Freq: Every day | ORAL | 5 refills | Status: DC

## 2021-08-24 MED ORDER — GLIPIZIDE ER 10 MG PO TB24: 10.0000 mg | ORAL_TABLET | Freq: Every day | ORAL | 0 refills | Status: DC

## 2021-08-24 MED ORDER — FINASTERIDE 5 MG PO TABS: 5.0000 mg | ORAL_TABLET | Freq: Every day | ORAL | 5 refills | Status: DC

## 2021-08-24 NOTE — Telephone Encounter (Signed)
Medication refill request from [facility]. Lov on 06/29/21 Next appointment on 10/05/21 Finasteride -last filled on 04/17/21 #30 with 5 refill  Tamsulosin - last filled on 04/17/21 #30 with 5 refill  Glipizide - last refilled on 05/23/20 #90 with 0 refill

## 2021-08-24 NOTE — Chronic Care Management (AMB) (Addendum)
Chronic Care Management Pharmacy Assistant   Name: Randy [name]  MRN: [id number] DOB: April 14, 1946  Reason for Encounter: Medication Adherence and Delivery Coordination   Recent office visits:  08/10/21-Family Medicine- Social worker phone call- note-encouraged increase in exercise. 07/14/21-Family Medicine-Social worker phone call-Referral placed via [facility]  Recent consult visits:  08/07/21-Podiatry-Patient presented for toenail trimming and right foot cyst.Xrays, no medication changes. 07/06/21-Cardiology-Patient presented for follow up electrophysiology.EKG-Start Amiodarone 246m take 1 tablet daily-consider increase carvedilol-awaiting Aricept from mail order.  Hospital visits:  None in previous 6 months  Medications: Outpatient Encounter Medications as of 08/24/2021  Medication Sig   Accu-Chek Softclix Lancets lancets USE AS DIRECTED  TO TEST BLOOD SUGAR ONE TIME DAILY  AND AS NEEDED (Patient not taking: Reported on 07/06/2021)   Alcohol Swabs (B-D SINGLE USE SWABS REGULAR) PADS Use as instructed to clean area for glucose monitoring once daily and as needed.  Diagnosis:  E11.21  Non insulin-dependent (Patient not taking: Reported on 07/06/2021)   allopurinol (ZYLOPRIM) 100 MG tablet Take 0.5 tablets (50 mg total) by mouth daily.   amiodarone (PACERONE) 200 MG tablet Take 1 tablet (200 mg total) by mouth daily.   apixaban (ELIQUIS) 5 MG TABS tablet Take 1 tablet (5 mg total) by mouth 2 (two) times daily.   atorvastatin (LIPITOR) 80 MG tablet TAKE 1 TABLET (80 MG TOTAL) DAILY AT 6 PM.   Blood Glucose Calibration (ACCU-CHEK AVIVA) SOLN Use to calibrate blood glucose machine as recommended.  Diagnosis:  E11.21  Non-insulin dependent. (Patient not taking: Reported on 07/06/2021)   Blood Glucose Monitoring Suppl (ACCU-CHEK GUIDE) w/Device KIT Use to check blood sugar once daily. (Patient not taking: Reported on 07/06/2021)   carvedilol (COREG) 3.125 MG tablet Take 1 tablet (3.125 mg  total) by mouth 2 (two) times daily.   donepezil (ARICEPT) 5 MG tablet Take 1 tablet (5 mg total) by mouth at bedtime.   ezetimibe (ZETIA) 10 MG tablet Take 1 tablet (10 mg total) by mouth daily.   finasteride (PROSCAR) 5 MG tablet Take 1 tablet (5 mg total) by mouth daily.   furosemide (LASIX) 20 MG tablet Take 1 tablet (20 mg total) by mouth every morning.   gabapentin (NEURONTIN) 300 MG capsule TAKE TWO CAPSULES BY MOUTH TWICE DAILY   glipiZIDE (GLUCOTROL XL) 10 MG 24 hr tablet Take 1 tablet (10 mg total) by mouth daily with breakfast.   glucose blood (ACCU-CHEK AVIVA PLUS) test strip Test Blood Sugar 1 time daily: Dx:E11.27 (Patient not taking: Reported on 07/06/2021)   indomethacin (INDOCIN) 25 MG capsule Take 1 capsule (25 mg total) by mouth 3 (three) times daily as needed.   sacubitril-valsartan (ENTRESTO) 24-26 MG TAKE ONE TABLET BY MOUTH EVERY MORNING and TAKE ONE TABLET BY MOUTH EVERY EVENING   tamsulosin (FLOMAX) 0.4 MG CAPS capsule Take 1 capsule (0.4 mg total) by mouth daily.   No facility-administered encounter medications on file as of 08/24/2021.    BP Readings from Last 3 Encounters:  07/06/21 (!) 110/50  06/29/21 90/62  03/10/21 (!) 108/56    Lab Results  Component Value Date   HGBA1C 7.9 (A) 06/29/2021      Recent OV, Consult or Hospital visit:  Recent medication changes indicated: 07/06/21-Cardiology-Patient presented for follow up electrophysiology.EKG-Start Amiodarone 2064mtake 1 tablet daily-consider increase carvedilol.   Last adherence delivery date:07/14/21      Patient is due for next adherence delivery on: 09/05/21  Spoke with patient on 08/25/21 reviewed medications and coordinated  delivery. Contacted the patient and had him to verify the boxes with medications. He also read the box of Entresto 24-26 mg from the manufacturer and we discussed dosing of this medication   This delivery to include: Adherence Packaging  30 Days  Packs: Carvedilol 3.125 mg - 1  BID (breakfast and evening meal) Gabapentin 300 mg - 2 BID (breakfast and evening meal) Furosemide 20 mg - 1 daily (breakfast) Allopurinol 100 mg - 1/2 tablet daily (breakfast) Tamsulosin 0.16m - 1 daily (evening meal) Finasteride 5 mg - 1 daily (breakfast) Atorvastatin 80 mg - 1 daily (breakfast) Glipizide ER 10 mg - 1 daily  (breakfast) Zetia 10 mg- 1 tablet daily (breakfast)  Donepezil 543m-1 tablet (bedtime)  VIAL medications: None- patient continues to have bottles of medications on hand. Encouraged the patient to put away and only use the [facility] in the box.   Patient declined the following medications this month: Entresto 24-26 mg  Take 1 tablet am take 1 tablet evening-patient gets from manufacturer- this comes in a box with a tablet in a package per the patient    Any concerns about your medications? No  How often do you forget or accidentally miss a dose? Once a week (noted in chart per the son)  Do you use a pillbox? No  Is patient in packaging Yes  What is the date on your next pill pack? The patient located the box from [facility]-patient unable to read the date.  Any concerns or issues with your packaging? Encouraged the patient to use only the packaging medications.   Refills requested from providers include: Tamsulosin, glipizide XL, Finasteride   Confirmed delivery date of 09/05/21, advised patient that pharmacy will contact them the morning of delivery.  Recent blood pressure readings are as follows: none available.Patient states "they take it in the doctors office."  Recent blood glucose readings are as follows:  none available   The patient did weigh himself this morning and reporting 182lbs.   He has loss 7 lbs since last office visit.  Annual wellness visit in last year? No Most Recent BP reading:110/50  60-P 07/06/21   If Diabetic: Most recent A1C reading: 7.9 06/30/21 Last eye exam / retinopathy  screening:2020 Last diabetic foot exam: never done    MiDebbora DusCPP notified  VeAvel SensorCCTupelossistant [phone] have reviewed the care management and care coordination activities outlined in this encounter and I am certifying that I agree with the content of this note. No further action required.  MiDebbora DusPharmD Clinical Pharmacist [phone]

## 2021-08-29 DIAGNOSIS — E1121 Type 2 diabetes mellitus with diabetic nephropathy: Secondary | ICD-10-CM

## 2021-09-06 NOTE — Progress Notes (Unsigned)
Electrophysiology Office Note Date: 09/06/2021  ID:  Randy [name], DOB May 03, 1946, MRN [id number]  PCP: Michela Pitcher, NP Primary Cardiologist: None Electrophysiologist: Cristopher Peru, MD   CC: Routine ICD follow-up  Randy [name] is a 76 y.o. male seen today for Cristopher Peru, MD for routine electrophysiology followup.  Since last being seen in our clinic the patient reports doing ***.  he denies chest pain, palpitations, dyspnea, PND, orthopnea, nausea, vomiting, dizziness, syncope, edema, weight gain, or early satiety. He has not had ICD shocks.   Device History: St. Jude Dual Chamber ICD implanted 9/62/9528 for systolic CHF History of appropriate therapy: No History of AAD therapy: Yes, on amiodarone  Past Medical History:  Diagnosis Date   AICD (automatic cardioverter/defibrillator) present 10/18/2016   "replaced 2009-St. Jude device, Dr Lovena Le"   Anemia    Arthritis    "back" (08/27/2017)   CAD (coronary artery disease)    CHF (congestive heart failure) (HCC)    CKD (chronic kidney disease) stage 3, GFR 30-59 ml/min (HCC)    [name]   Diverticulosis    Dyslipidemia    GERD (gastroesophageal reflux disease)    Hypertension    Neuromuscular disorder (HCC)    neuropathy- in hands   Osteoarthritis    hips, hands   Pacemaker    Shortness of breath    Sleep apnea    started a sleep study but didn't completed, states PCP told him he has sleep  apnea, doesn't use CPAP   Systolic dysfunction    Type 2 diabetes with nephropathy (HCC)    Vitamin D deficiency    Past Surgical History:  Procedure Laterality Date   ANTERIOR CERVICAL DECOMP/DISCECTOMY FUSION     BACK SURGERY     CARPAL TUNNEL RELEASE Left    CORONARY ARTERY BYPASS GRAFT N/A 09/02/2017   Procedure: CORONARY ARTERY BYPASS GRAFTING (CABG) x three , using left internal mammary artery and right leg greater saphenous vein harvested endoscopically;  Surgeon: [name], MD;  Location: [place] [facility];  Service: Open  Heart Surgery;  Laterality: N/A;   CYST EXCISION     "back"   ICD IMPLANT N/A 10/18/2016   Procedure: ICD Implant- Upgrade to Dual Chamber;  Surgeon: Evans Lance, MD;  Location: [place] [facility];  Service: Cardiovascular;  Laterality: N/A;   INSERT / REPLACE / REMOVE PACEMAKER     JOINT REPLACEMENT     LEFT HEART CATH AND CORONARY ANGIOGRAPHY N/A 08/30/2017   Procedure: LEFT HEART CATH AND CORONARY ANGIOGRAPHY;  Surgeon: Martinique, Peter M, MD;  Location: [place] [facility];  Service: Cardiovascular;  Laterality: N/A;   LUMBAR LAMINECTOMY Left 07/17/2013   Procedure: MICRODISCECTOMY LUMBAR LAMINECTOMY;  Surgeon: Marybelle Killings, MD;  Location: [facility];  Service: Orthopedics;  Laterality: Left;  Left L4 Laminotomy, Lateral Recess Decompression, Cyst Excision   SHOULDER OPEN ROTATOR CUFF REPAIR Left    TEE WITHOUT CARDIOVERSION N/A 09/02/2017   Procedure: TRANSESOPHAGEAL ECHOCARDIOGRAM (TEE);  Surgeon: [name], MD;  Location: [facility];  Service: Open Heart Surgery;  Laterality: N/A;   TOTAL HIP ARTHROPLASTY Left 09/17/2012   Procedure: Left TOTAL HIP ARTHROPLASTY ANTERIOR APPROACH;  Surgeon: Marybelle Killings, MD;  Location: [facility];  Service: Orthopedics;  Laterality: Left;    Current Outpatient Medications  Medication Sig Dispense Refill   Accu-Chek Softclix Lancets lancets USE AS DIRECTED  TO TEST BLOOD SUGAR ONE TIME DAILY  AND AS NEEDED (Patient not taking: Reported  on 07/06/2021) 200 each 2   Alcohol Swabs (B-D SINGLE USE SWABS REGULAR) PADS Use as instructed to clean area for glucose monitoring once daily and as needed.  Diagnosis:  E11.21  Non insulin-dependent (Patient not taking: Reported on 07/06/2021) 100 each 3   allopurinol (ZYLOPRIM) 100 MG tablet Take 0.5 tablets (50 mg total) by mouth daily. 15 tablet 2   amiodarone (PACERONE) 200 MG tablet Take 1 tablet (200 mg total) by mouth daily. 90 tablet 3   apixaban (ELIQUIS) 5 MG TABS tablet Take 1 tablet (5 mg total) by mouth 2 (two) times daily.  60 tablet 11   atorvastatin (LIPITOR) 80 MG tablet TAKE 1 TABLET (80 MG TOTAL) DAILY AT 6 PM. 90 tablet 3   Blood Glucose Calibration (ACCU-CHEK AVIVA) SOLN Use to calibrate blood glucose machine as recommended.  Diagnosis:  E11.21  Non-insulin dependent. (Patient not taking: Reported on 07/06/2021) 1 each 2   Blood Glucose Monitoring Suppl (ACCU-CHEK GUIDE) w/Device KIT Use to check blood sugar once daily. (Patient not taking: Reported on 07/06/2021) 1 kit 0   carvedilol (COREG) 3.125 MG tablet Take 1 tablet (3.125 mg total) by mouth 2 (two) times daily. 60 tablet 3   donepezil (ARICEPT) 5 MG tablet Take 1 tablet (5 mg total) by mouth at bedtime. 90 tablet 0   ezetimibe (ZETIA) 10 MG tablet Take 1 tablet (10 mg total) by mouth daily. 90 tablet 0   finasteride (PROSCAR) 5 MG tablet Take 1 tablet (5 mg total) by mouth daily. 30 tablet 5   furosemide (LASIX) 20 MG tablet Take 1 tablet (20 mg total) by mouth every morning. 30 tablet 2   gabapentin (NEURONTIN) 300 MG capsule TAKE TWO CAPSULES BY MOUTH TWICE DAILY 120 capsule 2   glipiZIDE (GLUCOTROL XL) 10 MG 24 hr tablet Take 1 tablet (10 mg total) by mouth daily with breakfast. 90 tablet 0   glucose blood (ACCU-CHEK AVIVA PLUS) test strip Test Blood Sugar 1 time daily: Dx:E11.27 (Patient not taking: Reported on 07/06/2021) 100 each 3   indomethacin (INDOCIN) 25 MG capsule Take 1 capsule (25 mg total) by mouth 3 (three) times daily as needed. 21 capsule 1   sacubitril-valsartan (ENTRESTO) 24-26 MG TAKE ONE TABLET BY MOUTH EVERY MORNING and TAKE ONE TABLET BY MOUTH EVERY EVENING 180 tablet 0   tamsulosin (FLOMAX) 0.4 MG CAPS capsule Take 1 capsule (0.4 mg total) by mouth daily. 30 capsule 5   No current facility-administered medications for this visit.    Allergies:   Patient has no known allergies.   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   Desires CPR, does want life support if not futile. 05/30/2020- discussed, patient would want CPR/ intubation. Does not have HCPOA/ living will      Social Determinants of Health   Financial Resource Strain: Not on file  Food Insecurity: Not on file  Transportation Needs: No Transportation Needs   Lack of Transportation (Medical): No   Lack of Transportation (Non-Medical): No  Physical Activity: Not on file  Stress: Not on file  Social Connections: Not on file  Intimate Partner Violence: Not on file    Family History: Family History  Problem Relation Age of Onset   Heart attack Father 8   Diabetes Brother     Review of Systems: All other systems reviewed and are otherwise negative except as noted above.   Physical Exam: There were no vitals filed for this visit.   GEN- The patient is well appearing, alert and oriented x 3 today.   HEENT: normocephalic, atraumatic; sclera clear, conjunctiva pink; hearing intact; oropharynx clear; neck supple, no JVP Lymph- no cervical lymphadenopathy Lungs- Clear to ausculation bilaterally, normal work of breathing.  No wheezes, rales, rhonchi Heart- Regular rate and rhythm, no murmurs, rubs or gallops, PMI not laterally displaced GI- soft, non-tender, non-distended,  bowel sounds present, no hepatosplenomegaly Extremities- no clubbing or cyanosis. No edema; DP/PT/radial pulses 2+ bilaterally MS- no significant deformity or atrophy Skin- warm and dry, no rash or lesion; ICD pocket well healed Psych- euthymic mood, full affect Neuro- strength and sensation are intact  ICD interrogation- reviewed in detail today,  See PACEART report  EKG:  EKG is ordered today. Personal review of EKG ordered today shows ***  Recent Labs: 03/10/2021: TSH 1.45 06/29/2021: ALT 7; BUN 18; Creatinine, Ser 1.66; Hemoglobin 11.4; Platelets 204.0; Potassium 3.8; Sodium 137   Wt Readings from Last 3 Encounters:  07/06/21 189 lb (85.7 kg)  06/29/21 184 lb 2 oz (83.5 kg)  03/10/21 182 lb 8 oz (82.8 kg)     Other studies Reviewed: Additional studies/ records that were reviewed today include: Previous EP office notes.   Assessment and Plan:  1.  Chronic systolic dysfunction s/p St. Jude dual chamber ICD  euvolemic today Stable on an appropriate medical regimen Normal ICD function See Pace Art report No changes today  2. NSVT/VT Continue amiodarone VT with therapy in 04/2021.   3. PAF Maintaining NSR with overall low burden of AF. Continue eliquis  4. Dementia Per primary.   Current medicines are reviewed at length with the patient today.    Labs/ tests ordered today include: *** No orders of the defined types were placed in this encounter.    Disposition:   Follow up with {Blank single:19197::"Dr. Allred","Dr. Arlan Organ. Klein","Dr. Camnitz","Dr. Lambert","EP APP"} in {Blank single:19197::"2 weeks","4 weeks","3 months","6 months","12 months","as usual post gen change"}    Signed, Annamaria Helling  09/06/2021 10:19 AM  [facility] [address] [place] [address] [phone] (office) [phone] (fax)

## 2021-09-08 ENCOUNTER — Telehealth: Payer: Medicare Other

## 2021-09-08 ENCOUNTER — Other Ambulatory Visit: Payer: Self-pay

## 2021-09-08 ENCOUNTER — Ambulatory Visit: Payer: Medicare HMO | Admitting: Podiatry

## 2021-09-08 DIAGNOSIS — M7989 Other specified soft tissue disorders: Secondary | ICD-10-CM

## 2021-09-11 ENCOUNTER — Ambulatory Visit (INDEPENDENT_AMBULATORY_CARE_PROVIDER_SITE_OTHER): Payer: Medicare Other

## 2021-09-11 DIAGNOSIS — Z95810 Presence of automatic (implantable) cardiac defibrillator: Secondary | ICD-10-CM

## 2021-09-11 DIAGNOSIS — I5022 Chronic systolic (congestive) heart failure: Secondary | ICD-10-CM

## 2021-09-12 ENCOUNTER — Telehealth: Payer: Medicare PPO

## 2021-09-12 NOTE — Progress Notes (Signed)
Subjective: 76 year old male presents the office today for follow up evaluation of a cyst on the top of his right foot and ankle for 10 years.  Not causing any discomfort.  Has not changed in size.  Has no other concerns today.  Objective: AAO x3, NAD DP/PT pulses palpable bilaterally, CRT less than 3 seconds The dorsal aspect the right foot is a large fluid-filled soft tissue mass.  It has been present for many years and has not changed she reports.  There is no tenderness there is no erythema.  No open lesions associated with this. No pain with calf compression, swelling, warmth, erythema  Assessment: Large soft tissue mass right foot  Plan: -All treatment options discussed with the patient including all alternatives, risks, complications.  -Previously tried aspiration.  Pathology showed likely contents of an epidermoid inclusion cyst. -We will order ultrasound for further evaluation.  Discussed surgical excision but he states is not causing any pain.  We will get the ultrasound to further discuss treatment options. -Patient encouraged to call the office with any questions, concerns, change in symptoms.   Trula Slade DPM

## 2021-09-13 ENCOUNTER — Telehealth: Payer: Self-pay

## 2021-09-13 ENCOUNTER — Encounter: Payer: Medicare HMO | Admitting: Student

## 2021-09-13 DIAGNOSIS — I4729 Other ventricular tachycardia: Secondary | ICD-10-CM

## 2021-09-13 DIAGNOSIS — I480 Paroxysmal atrial fibrillation: Secondary | ICD-10-CM

## 2021-09-13 DIAGNOSIS — F0390 Unspecified dementia without behavioral disturbance: Secondary | ICD-10-CM

## 2021-09-13 DIAGNOSIS — I5022 Chronic systolic (congestive) heart failure: Secondary | ICD-10-CM

## 2021-09-13 NOTE — Telephone Encounter (Signed)
Attempted to reach patient regarding episodes, no answer on home or cell unable to leave VM, patient has appointment scheduled with Estrella Myrtle PA 09/13/21 at 11:00 AM.   [facility] alert for VT/VF with therapy Three VT-1 events from 2/13 @ 17:09-17:10, EGM's show likely NSVT based on discriminator, HR's 190-193, ATP delivered x1 in all events slowing the rhythm to AF with slowing of V rate

## 2021-09-13 NOTE — Telephone Encounter (Signed)
LMOVM for patient to send missed ICM transmission. 

## 2021-09-14 ENCOUNTER — Telehealth: Payer: Self-pay

## 2021-09-14 NOTE — Progress Notes (Signed)
EPIC Encounter for ICM Monitoring  Patient Name: Randy [name] is a 76 y.o. male Date: 09/14/2021 Primary Care Physican: Michela Pitcher, NP Primary Cardiologist: Lovena Le Electrophysiologist: Lovena Le 07/05/2021 Weight: 189 lbs   09/12/2021 Report:   AT/AF Burden: 8.7%  (07/06/2021 Report AT/AF Burden was 0%) VT/VF Episodes: 4---3 episodes with ATP delivered and 1 NSVT    Attempted call to patient and unable to reach.  Left detailed message per DPR regarding transmission. Transmission reviewed.    [facility]vue thoracic impedance suggesting normal fluid levels.   Device clinic RN has attempted call on 2/15 to patient regarding ATP episodes for VT on 2/13 and unable to reach patient.     Prescribed: Furosemide 20 mg 1 tablet daily   Labs: 06/29/2021 Creatinine 1.66, BUN , Potassium 3.8, Sodium 137, GFR 40.04 03/10/2021 Creatinine 1.86, BUN 32, Potassium 3.9, Sodium 138, GFR 35 A complete set of results can be found in Results Review.   Recommendations: Left voice mail with ICM number and encouraged to call if experiencing any fluid symptoms.  Also left message to contact device clinic to discuss findings on this weeks report and left device clinic number.    Follow-up plan: ICM clinic phone appointment on 10/16/2021.  91 day device clinic remote transmission scheduled for 10/03/2021.    EP/Cardiology Office Visits:  Missed appointment 09/13/2021 with Oda Kilts, PA.     Copy of ICM check sent to Dr. Lovena Le.    3 month ICM trend: 09/12/2021.    12-14 Month ICM trend:     Rosalene Billings, RN 09/14/2021 7:56 AM

## 2021-09-14 NOTE — Telephone Encounter (Signed)
Remote ICM transmission received.  Attempted call to patient regarding ICM remote transmission and left detailed message per DPR.  Advised to return call for any fluid symptoms or questions. Next ICM remote transmission scheduled 10/16/2021.  Left device clinic number and advised to call regarding transmission results on this week's report.

## 2021-09-15 ENCOUNTER — Ambulatory Visit (INDEPENDENT_AMBULATORY_CARE_PROVIDER_SITE_OTHER): Payer: Medicare HMO | Admitting: *Deleted

## 2021-09-15 DIAGNOSIS — I10 Essential (primary) hypertension: Secondary | ICD-10-CM

## 2021-09-15 DIAGNOSIS — R2241 Localized swelling, mass and lump, right lower limb: Secondary | ICD-10-CM

## 2021-09-15 DIAGNOSIS — G3184 Mild cognitive impairment, so stated: Secondary | ICD-10-CM

## 2021-09-15 DIAGNOSIS — E1121 Type 2 diabetes mellitus with diabetic nephropathy: Secondary | ICD-10-CM

## 2021-09-15 NOTE — Patient Instructions (Signed)
Visit Information  Thank you for taking time to visit with me today. Please don't hesitate to contact me if I can be of assistance to you before our next scheduled telephone appointment.  Following are the goals we discussed today:  Consider long term plans and financial aspects of this when/if appropriate and ready  Our next appointment is by telephone on 10/23/21 at 11:30am  Please call the care guide team at [phone] if you need to cancel or reschedule your appointment.   If you are experiencing a Mental Health or [place] or need someone to talk to, please call the [place] National Suicide Prevention Lifeline: (518)146-4069 or TTY: 463 123 3515 TTY (848) 065-3326) to talk to a trained counselor call 1-800-273-TALK (toll free, 24 hour hotline) call 911   The patient verbalized understanding of instructions, educational materials, and care plan provided today and declined offer to receive copy of patient instructions, educational materials, and care plan.   Eduard Clos MSW, LCSW Licensed Clinical Social Worker [place]   [phone]

## 2021-09-15 NOTE — Chronic Care Management (AMB) (Signed)
Chronic Care Management    Clinical Social Work Note  09/15/2021 Name: Randy [name] MRN: [id number] DOB: 09-Apr-1946  Randy [name] is a 76 y.o. year old male who is a primary care patient of Kilian, Schwartz, NP. The CCM team was consulted to assist the patient with chronic disease management and/or care coordination needs related to: Level of Care Concerns.   Engaged with patient by telephone for follow up visit in response to provider referral for social work chronic care management and care coordination services.   Consent to Services:  The patient was given information about Chronic Care Management services, agreed to services, and gave verbal consent prior to initiation of services.  Please see initial visit note for detailed documentation.   Patient agreed to services and consent obtained.   Assessment: Review of patient past medical history, allergies, medications, and health status, including review of relevant consultants reports was performed today as part of a comprehensive evaluation and provision of chronic care management and care coordination services.     SDOH (Social Determinants of Health) assessments and interventions performed:    Advanced Directives Status: Not addressed in this encounter.  CCM Care Plan  No Known Allergies  Outpatient Encounter Medications as of 09/15/2021  Medication Sig   Accu-Chek Softclix Lancets lancets USE AS DIRECTED  TO TEST BLOOD SUGAR ONE TIME DAILY  AND AS NEEDED (Patient not taking: Reported on 07/06/2021)   Alcohol Swabs (B-D SINGLE USE SWABS REGULAR) PADS Use as instructed to clean area for glucose monitoring once daily and as needed.  Diagnosis:  E11.21  Non insulin-dependent (Patient not taking: Reported on 07/06/2021)   allopurinol (ZYLOPRIM) 100 MG tablet Take 0.5 tablets (50 mg total) by mouth daily.   amiodarone (PACERONE) 200 MG tablet Take 1 tablet (200 mg total) by mouth daily.   apixaban (ELIQUIS) 5 MG TABS tablet Take 1  tablet (5 mg total) by mouth 2 (two) times daily.   atorvastatin (LIPITOR) 80 MG tablet TAKE 1 TABLET (80 MG TOTAL) DAILY AT 6 PM.   Blood Glucose Calibration (ACCU-CHEK AVIVA) SOLN Use to calibrate blood glucose machine as recommended.  Diagnosis:  E11.21  Non-insulin dependent. (Patient not taking: Reported on 07/06/2021)   Blood Glucose Monitoring Suppl (ACCU-CHEK GUIDE) w/Device KIT Use to check blood sugar once daily. (Patient not taking: Reported on 07/06/2021)   carvedilol (COREG) 3.125 MG tablet Take 1 tablet (3.125 mg total) by mouth 2 (two) times daily.   donepezil (ARICEPT) 5 MG tablet Take 1 tablet (5 mg total) by mouth at bedtime.   ezetimibe (ZETIA) 10 MG tablet Take 1 tablet (10 mg total) by mouth daily.   finasteride (PROSCAR) 5 MG tablet Take 1 tablet (5 mg total) by mouth daily.   furosemide (LASIX) 20 MG tablet Take 1 tablet (20 mg total) by mouth every morning.   gabapentin (NEURONTIN) 300 MG capsule TAKE TWO CAPSULES BY MOUTH TWICE DAILY   glipiZIDE (GLUCOTROL XL) 10 MG 24 hr tablet Take 1 tablet (10 mg total) by mouth daily with breakfast.   glucose blood (ACCU-CHEK AVIVA PLUS) test strip Test Blood Sugar 1 time daily: Dx:E11.27 (Patient not taking: Reported on 07/06/2021)   indomethacin (INDOCIN) 25 MG capsule Take 1 capsule (25 mg total) by mouth 3 (three) times daily as needed.   sacubitril-valsartan (ENTRESTO) 24-26 MG TAKE ONE TABLET BY MOUTH EVERY MORNING and TAKE ONE TABLET BY MOUTH EVERY EVENING   tamsulosin (FLOMAX) 0.4 MG CAPS capsule Take 1 capsule (0.4  mg total) by mouth daily.   No facility-administered encounter medications on file as of 09/15/2021.    Patient Active Problem List   Diagnosis Date Noted   Mild cognitive impairment 03/10/2021   Mass of right foot 03/10/2021   Idiopathic gout of multiple sites 10/07/2018   Grief 06/16/2018   Dizziness 06/16/2018   Allergic rhinitis 04/24/2018   S/P CABG x 3 09/02/2017   Leukocytosis 08/27/2017   Visit for  well man health check 05/07/2017   COPD (chronic obstructive pulmonary disease) ([place]) 24/23/5361   Chronic systolic CHF (congestive heart failure) ([place]) 10/18/2016   Generalized OA 06/22/2014   CKD (chronic kidney disease) stage 3, GFR 30-59 ml/Randy (HCC)    Synovial cyst of lumbar spine 07/17/2013    Class: Diagnosis of   Osteoarthritis of left hip 09/17/2012   Automatic implantable cardioverter-defibrillator in situ - [employer] 44/31/5400   Chronic systolic heart failure ([place]) 05/01/2011   IMPOTENCE OF ORGANIC ORIGIN 02/20/2010   Vitamin D deficiency 02/14/2010   Type 2 diabetes with nephropathy ([place]) 01/11/2010   Implantable cardioverter-defibrillator (ICD) in situ 05/10/2009   HLD (hyperlipidemia) 02/24/2008   Essential hypertension 02/24/2008   Non-ST elevation (NSTEMI) myocardial infarction ([place]) 02/24/2008   HYPERSOMNIA UNSPECIFIED 02/24/2008    Conditions to be addressed/monitored: Dementia; Level of care concerns  Care Plan : LCSW Plan of Care  Updates made by [name], LCSW since 09/15/2021 12:00 AM     Problem: Mobility and Independence      Long-Range Goal: Mobility and Independence Optimized   Start Date: 05/24/2021  Expected End Date: 10/26/2021  This Visit's Progress: On track  Recent Progress: On track  Priority: High  Note:   Current barriers:   Patient in need of assistance with connecting to community resources for Limited social support, Level of care concerns, and Cognitive Deficits Acknowledges deficits with meeting this unmet need Patient is unable to independently navigate community resource options without care coordination support Clinical Goals:  patient will demonstrate improved health management independence as evidenced byfamily report, overall activity/outcomes Clinical Interventions:  09/15/21- CSW spoke with pt's son who reports pt is doing ok; "maintaining and doing pretty good". Pt remains living alone; does drive to golf course,  restaurants and grocery store nearby.   He feels he is doing ok and does acknowledge that eventually he may need to seek placement for him.  CSW reviewed the process of seeking placement as well as the financial pieces.  Son denies any concerns at this time- CSW will follow up again in about 4-6 weeks to further assess.   Collaboration with Michela Pitcher, NP regarding development and update of comprehensive plan of care as evidenced by provider attestation and co-signature Inter-disciplinary care team collaboration (see longitudinal plan of care) Assessment of needs, barriers , agencies contacted, as well as how impacting  Review various resources, discussed options and provided patient information about Limited social support, Level of care concerns, Social Isolation, and Memory Deficits Collaborated with appropriate clinical care team members regarding patient needs Memory Deficits, Dementia Referral placed via IXL 360 Patient interviewed and appropriate assessments performed Other interventions provided: Reviewed mental health medications with patient and discussed importance of compliance:  Increase in actives / exercise encouraged  Patient Goals:  - continue taking medications as prescribed Follow Up Plan: SW will follow up with patient by phone over the next 45 days        Follow Up Plan: Appointment scheduled for SW follow up with  client by phone on: 10/23/21      Eduard Clos MSW, [place] Licensed Clinical Social Worker [place]   [phone]

## 2021-09-22 ENCOUNTER — Telehealth: Payer: Self-pay

## 2021-09-22 ENCOUNTER — Other Ambulatory Visit: Payer: Self-pay | Admitting: Nurse Practitioner

## 2021-09-22 DIAGNOSIS — G3184 Mild cognitive impairment, so stated: Secondary | ICD-10-CM

## 2021-09-22 DIAGNOSIS — M79671 Pain in right foot: Secondary | ICD-10-CM

## 2021-09-22 DIAGNOSIS — M79672 Pain in left foot: Secondary | ICD-10-CM

## 2021-09-22 NOTE — Chronic Care Management (AMB) (Signed)
Chronic Care Management Pharmacy Assistant   Name: Randy [name]  MRN: [id number] DOB: Feb 24, 1946   Reason for Encounter: Medication Adherence and Delivery Coordination    Recent office visits:  09/15/21- Family Medicine- LCSW -telemedicine-no medication changes  Recent consult visits:  09/08/21-Podiatry-[facility] presented for follow up right foot cyst.Referral for ultrasound.  Hospital visits:  None in previous 6 months  Medications: Outpatient Encounter Medications as of 09/22/2021  Medication Sig   Accu-Chek Softclix Lancets lancets USE AS DIRECTED  TO TEST BLOOD SUGAR ONE TIME DAILY  AND AS NEEDED (Patient not taking: Reported on 07/06/2021)   Alcohol Swabs (B-D SINGLE USE SWABS REGULAR) PADS Use as instructed to clean area for glucose monitoring once daily and as needed.  Diagnosis:  E11.21  Non insulin-dependent (Patient not taking: Reported on 07/06/2021)   allopurinol (ZYLOPRIM) 100 MG tablet Take 0.5 tablets (50 mg total) by mouth daily.   amiodarone (PACERONE) 200 MG tablet Take 1 tablet (200 mg total) by mouth daily.   apixaban (ELIQUIS) 5 MG TABS tablet Take 1 tablet (5 mg total) by mouth 2 (two) times daily.   atorvastatin (LIPITOR) 80 MG tablet TAKE 1 TABLET (80 MG TOTAL) DAILY AT 6 PM.   Blood Glucose Calibration (ACCU-CHEK AVIVA) SOLN Use to calibrate blood glucose machine as recommended.  Diagnosis:  E11.21  Non-insulin dependent. (Patient not taking: Reported on 07/06/2021)   Blood Glucose Monitoring Suppl (ACCU-CHEK GUIDE) w/Device KIT Use to check blood sugar once daily. (Patient not taking: Reported on 07/06/2021)   carvedilol (COREG) 3.125 MG tablet Take 1 tablet (3.125 mg total) by mouth 2 (two) times daily.   donepezil (ARICEPT) 5 MG tablet Take 1 tablet (5 mg total) by mouth at bedtime.   ezetimibe (ZETIA) 10 MG tablet Take 1 tablet (10 mg total) by mouth daily.   finasteride (PROSCAR) 5 MG tablet Take 1 tablet (5 mg total) by mouth daily.    furosemide (LASIX) 20 MG tablet Take 1 tablet (20 mg total) by mouth every morning.   gabapentin (NEURONTIN) 300 MG capsule TAKE TWO CAPSULES BY MOUTH TWICE DAILY   glipiZIDE (GLUCOTROL XL) 10 MG 24 hr tablet Take 1 tablet (10 mg total) by mouth daily with breakfast.   glucose blood (ACCU-CHEK AVIVA PLUS) test strip Test Blood Sugar 1 time daily: Dx:E11.27 (Patient not taking: Reported on 07/06/2021)   indomethacin (INDOCIN) 25 MG capsule Take 1 capsule (25 mg total) by mouth 3 (three) times daily as needed.   sacubitril-valsartan (ENTRESTO) 24-26 MG TAKE ONE TABLET BY MOUTH EVERY MORNING and TAKE ONE TABLET BY MOUTH EVERY EVENING   tamsulosin (FLOMAX) 0.4 MG CAPS capsule Take 1 capsule (0.4 mg total) by mouth daily.   No facility-administered encounter medications on file as of 09/22/2021.   BP Readings from Last 3 Encounters:  07/06/21 (!) 110/50  06/29/21 90/62  03/10/21 (!) 108/56    Lab Results  Component Value Date   HGBA1C 7.9 (A) 06/29/2021      Recent OV, Consult or Hospital visit: podiatry, LCSW No medication changes indicated   Last adherence delivery date:09/05/21      Patient is due for next adherence delivery on: 10/05/21  Multiple attempts made to reach patient. Unsuccessful outreach. Will refill based off of last adherence fill.   This delivery to include: Adherence Packaging  30 Days  Packs: Carvedilol 3.125 mg - 1 BID (breakfast and evening meal) Gabapentin 300 mg - 2 BID (breakfast and evening meal) Furosemide 20 mg -  1 daily (breakfast) Allopurinol 100 mg - 1/2 tablet daily (breakfast) Tamsulosin 0.44m - 1 daily (evening meal) Finasteride 5 mg - 1 daily (breakfast) Atorvastatin 80 mg - 1 tablet (evening meal) Glipizide ER 10 mg - 1 daily  (breakfast) Zetia 10 mg- 1 tablet daily (breakfast)  Donepezil 538m-1 tablet (bedtime) Amiodarone 2002m 1 tablet (breakfast)  VIAL medications: none   Patient declined the following medications this month: Entresto  [id number] 1 tablet twice daily - gets from manufacturer   Refills requested from providers include:gabapentin, donepezil,ezetimibe   Delivery scheduled for 10/05/21. Unable to speak with patient to confirm date.    Annual wellness visit in last year? No Most Recent BP reading:110/50  60-P 07/06/21  If Diabetic: Most recent A1C reading: 7.9 06/30/21 Last eye exam / retinopathy screening:2020 Last diabetic foot exam: never done   [facility] notified  VelmAvel SensorMAQueens [phone] time spent for month CPA: 0 min

## 2021-09-26 DIAGNOSIS — E1121 Type 2 diabetes mellitus with diabetic nephropathy: Secondary | ICD-10-CM

## 2021-09-26 DIAGNOSIS — I10 Essential (primary) hypertension: Secondary | ICD-10-CM

## 2021-10-03 ENCOUNTER — Ambulatory Visit (INDEPENDENT_AMBULATORY_CARE_PROVIDER_SITE_OTHER): Payer: Medicare PPO

## 2021-10-03 DIAGNOSIS — I5022 Chronic systolic (congestive) heart failure: Secondary | ICD-10-CM

## 2021-10-04 LAB — CUP PACEART REMOTE DEVICE CHECK
Battery Remaining Longevity: 49 mo
Battery Remaining Percentage: 51 %
Battery Voltage: 2.92 V
Brady Statistic AP VP Percent: 1 %
Brady Statistic AP VS Percent: 7.3 %
Brady Statistic AS VP Percent: 1 %
Brady Statistic AS VS Percent: 86 %
Brady Statistic RA Percent Paced: 1 %
Brady Statistic RV Percent Paced: 1.7 %
Date Time Interrogation Session: 20230307215436
HighPow Impedance: 72 Ohm
HighPow Impedance: 72 Ohm
Implantable Lead Implant Date: 20091022
Implantable Lead Implant Date: 20180322
Implantable Lead Location: 753859
Implantable Lead Location: 753860
Implantable Lead Model: 7121
Implantable Pulse Generator Implant Date: 20180322
Lead Channel Impedance Value: 1800 Ohm
Lead Channel Impedance Value: 290 Ohm
Lead Channel Pacing Threshold Amplitude: 0.75 V
Lead Channel Pacing Threshold Amplitude: 1.25 V
Lead Channel Pacing Threshold Pulse Width: 0.5 ms
Lead Channel Pacing Threshold Pulse Width: 1 ms
Lead Channel Sensing Intrinsic Amplitude: 1.5 mV
Lead Channel Sensing Intrinsic Amplitude: 7.6 mV
Lead Channel Setting Pacing Amplitude: 2 V
Lead Channel Setting Pacing Amplitude: 3 V
Lead Channel Setting Pacing Pulse Width: 0.8 ms
Lead Channel Setting Sensing Sensitivity: 0.5 mV
Pulse Gen Serial Number: 7413245

## 2021-10-05 ENCOUNTER — Ambulatory Visit: Payer: Self-pay | Admitting: Nurse Practitioner

## 2021-10-05 ENCOUNTER — Ambulatory Visit: Payer: Medicare PPO | Admitting: Nurse Practitioner

## 2021-10-12 ENCOUNTER — Telehealth: Payer: Self-pay

## 2021-10-12 NOTE — Chronic Care Management (AMB) (Signed)
? ? ?  Chronic Care Management ?Pharmacy Assistant  ? ?Name: Randy [name]  MRN: [id number] DOB: 01/17/1946 ? ? ?Reason for Encounter: Reminder Call ?  ?Conditions to be addressed/monitored: ?HTN, HLD, COPD, DMII, and CKD Stage 3 ? ? ?Medications: ?Outpatient Encounter Medications as of 10/12/2021  ?Medication Sig  ? Accu-Chek Softclix Lancets lancets USE AS DIRECTED  TO TEST BLOOD SUGAR ONE TIME DAILY  AND AS NEEDED (Patient not taking: Reported on 07/06/2021)  ? Alcohol Swabs (B-D SINGLE USE SWABS REGULAR) PADS Use as instructed to clean area for glucose monitoring once daily and as needed.  Diagnosis:  E11.21  Non insulin-dependent (Patient not taking: Reported on 07/06/2021)  ? allopurinol (ZYLOPRIM) 100 MG tablet Take 0.5 tablets (50 mg total) by mouth daily.  ? amiodarone (PACERONE) 200 MG tablet Take 1 tablet (200 mg total) by mouth daily.  ? apixaban (ELIQUIS) 5 MG TABS tablet Take 1 tablet (5 mg total) by mouth 2 (two) times daily.  ? atorvastatin (LIPITOR) 80 MG tablet TAKE 1 TABLET (80 MG TOTAL) DAILY AT 6 PM.  ? Blood Glucose Calibration (ACCU-CHEK AVIVA) SOLN Use to calibrate blood glucose machine as recommended.  Diagnosis:  E11.21  Non-insulin dependent. (Patient not taking: Reported on 07/06/2021)  ? Blood Glucose Monitoring Suppl (ACCU-CHEK GUIDE) w/Device KIT Use to check blood sugar once daily. (Patient not taking: Reported on 07/06/2021)  ? carvedilol (COREG) 3.125 MG tablet Take 1 tablet (3.125 mg total) by mouth 2 (two) times daily.  ? donepezil (ARICEPT) 5 MG tablet TAKE ONE TABLET BY MOUTH EVERY EVENING  ? ezetimibe (ZETIA) 10 MG tablet TAKE ONE TABLET BY MOUTH EVERY MORNING  ? finasteride (PROSCAR) 5 MG tablet Take 1 tablet (5 mg total) by mouth daily.  ? furosemide (LASIX) 20 MG tablet Take 1 tablet (20 mg total) by mouth every morning.  ? gabapentin (NEURONTIN) 300 MG capsule TAKE TWO CAPSULES BY MOUTH TWICE DAILY  ? glipiZIDE (GLUCOTROL XL) 10 MG 24 hr tablet Take 1 tablet (10 mg total) by  mouth daily with breakfast.  ? glucose blood (ACCU-CHEK AVIVA PLUS) test strip Test Blood Sugar 1 time daily: Dx:E11.27 (Patient not taking: Reported on 07/06/2021)  ? indomethacin (INDOCIN) 25 MG capsule Take 1 capsule (25 mg total) by mouth 3 (three) times daily as needed.  ? sacubitril-valsartan (ENTRESTO) 24-26 MG TAKE ONE TABLET BY MOUTH EVERY MORNING and TAKE ONE TABLET BY MOUTH EVERY EVENING  ? tamsulosin (FLOMAX) 0.4 MG CAPS capsule Take 1 capsule (0.4 mg total) by mouth daily.  ? ?No facility-administered encounter medications on file as of 10/12/2021.  ? ? ?[name] did not answer the telephone  to remind of upcoming telephone visit with Charlene Brooke on 10/17/21 at 3:00pm. Patient was reminded to have any blood glucose and blood pressure readings available for review at appointment. If unable to reach, a voicemail was left for patient.  ? ? ? ?Star Rating Drugs: ?Medication:  Last Fill: Day Supply ?Atorvastatin 65m 09/30/21  30 ?Glipizide 152m3/4/23  30 ?Entresto [id number] ? ? ?LinCharlene BrookePP notified ? ?Lolah Coghlan, CCMA ?Health concierge  ?[phone]

## 2021-10-13 ENCOUNTER — Telehealth: Payer: Self-pay

## 2021-10-13 NOTE — Telephone Encounter (Signed)
[facility] is wanting to transfer medcations for Gabapentin 300 mg, Glizpizide 10 mg, Ezetrimibe '10mg'$ , Tamsulosin 0.4 mg, CaRrvedilol 3.1025 mg, Finasteride 5 mg, Allopurinol 100 mg, and Furosemide 20 mg. Fax to [phone]. [place] Pharmacist. DEA# [id number] Address [address] [place] [phone]

## 2021-10-16 NOTE — Progress Notes (Signed)
Remote ICD transmission.   

## 2021-10-17 ENCOUNTER — Other Ambulatory Visit: Payer: Self-pay

## 2021-10-17 ENCOUNTER — Ambulatory Visit (INDEPENDENT_AMBULATORY_CARE_PROVIDER_SITE_OTHER): Payer: Medicare HMO | Admitting: Pharmacist

## 2021-10-17 ENCOUNTER — Telehealth: Payer: Self-pay | Admitting: Pharmacist

## 2021-10-17 DIAGNOSIS — E1121 Type 2 diabetes mellitus with diabetic nephropathy: Secondary | ICD-10-CM

## 2021-10-17 DIAGNOSIS — I480 Paroxysmal atrial fibrillation: Secondary | ICD-10-CM

## 2021-10-17 DIAGNOSIS — G3184 Mild cognitive impairment, so stated: Secondary | ICD-10-CM

## 2021-10-17 DIAGNOSIS — N183 Chronic kidney disease, stage 3 unspecified: Secondary | ICD-10-CM

## 2021-10-17 DIAGNOSIS — I5022 Chronic systolic (congestive) heart failure: Secondary | ICD-10-CM

## 2021-10-17 DIAGNOSIS — E782 Mixed hyperlipidemia: Secondary | ICD-10-CM

## 2021-10-17 DIAGNOSIS — I252 Old myocardial infarction: Secondary | ICD-10-CM

## 2021-10-17 DIAGNOSIS — I10 Essential (primary) hypertension: Secondary | ICD-10-CM

## 2021-10-17 NOTE — Progress Notes (Signed)
? ?Chronic Care Management ?Pharmacy Note ? ?10/18/2021 ?Name:  Randy [name] MRN:  [id number] DOB:  08-23-1945 ? ?Summary: CCM F/U visit ?-Pt never started Eliquis (sent to [facility], never picked up). Per cardiology notes overall Afib burden is low and pt maintains NSR. ?-Pt has not filled Entresto since 07/12/21 x 30 ds ([facility]) ?-Pt confuses pharmacy - [facility] keeps calling him and trying to fill things when [facility] should be filling his packs, leading to confusion and missed medications ? ?Recommendations/Changes made from today's visit: ?-Coordinate with cardiology for refill of Entresto ?-Consult with Dr Lovena Le regarding Eliquis continuation, as patient never started it ?-Contact [facility] and ask to cancel all Rx's/future fills ? ?Plan: ?-[facility] will call [facility] to cancel refills ?-Pharmacist follow up televisit scheduled for 3 months ? ? ? ?Subjective: ?Randy [name] is an 76 y.o. year old male who is a primary patient of Dwain, Huhn, NP.  The CCM team was consulted for assistance with disease management and care coordination needs.   ? ?Engaged with patient by telephone for follow up visit in response to provider referral for pharmacy case management and/or care coordination services.  ? ?Consent to Services:  ?The patient was given information about Chronic Care Management services, agreed to services, and gave verbal consent prior to initiation of services.  Please see initial visit note for detailed documentation.  ? ?Patient Care Team: ?Michela Pitcher, NP as PCP - General ?Evans Lance, MD as PCP - Electrophysiology (Cardiology) ?Evans Lance, MD as Consulting Physician (Cardiology) ?Marybelle Killings, MD as Consulting Physician (Orthopedic Surgery) ?[name], LCSW as Social Worker ?Nyree Applegate, Cleaster Corin, [facility] as Pharmacist (Pharmacist) ? ?Recent office visits: ?06/29/21 NP Romilda Garret OV: chronic f/u; start Donepezil 5 mg daily HS. ? ?Recent consult visits: ?07/06/21 PA  Joesph July (Cardiology): f/u CHF, NSVT. Start amiodarone 200 mg daily (pt misses AM meds often, wanted amiodarone on board for long 1/2 life) ? ?Hospital visits: ?None in previous 6 months ? ? ?Objective: ? ?Lab Results  ?Component Value Date  ? CREATININE 1.66 (H) 06/29/2021  ? BUN 18 06/29/2021  ? GFR 40.04 (L) 06/29/2021  ? GFRNONAA 23 (L) 06/10/2019  ? GFRAA 26 (L) 06/10/2019  ? NA 137 06/29/2021  ? K 3.8 06/29/2021  ? CALCIUM 9.1 06/29/2021  ? CO2 28 06/29/2021  ? GLUCOSE 173 (H) 06/29/2021  ? ? ?Lab Results  ?Component Value Date/Time  ? HGBA1C 7.9 (A) 06/29/2021 11:47 AM  ? HGBA1C 8.9 (H) 03/10/2021 10:25 AM  ? HGBA1C 7.9 (H) 05/30/2020 08:39 AM  ? HGBA1C 6.4 06/16/2018 09:19 AM  ? GFR 40.04 (L) 06/29/2021 11:54 AM  ? GFR 35.00 (L) 03/10/2021 10:25 AM  ? MICROALBUR 2.0 (H) 08/05/2019 09:36 AM  ? MICROALBUR 0.9 06/16/2018 09:35 AM  ?  ?Last diabetic Eye exam:  ?Lab Results  ?Component Value Date/Time  ? HMDIABEYEEXA No Retinopathy 07/14/2019 12:00 AM  ?  ?Last diabetic Foot exam: No results found for: HMDIABFOOTEX  ? ?Lab Results  ?Component Value Date  ? CHOL 182 06/29/2021  ? HDL 35.80 (L) 06/29/2021  ? LDLCALC 109 (H) 06/29/2021  ? LDLDIRECT 116.0 03/10/2021  ? TRIG 188.0 (H) 06/29/2021  ? CHOLHDL 5 06/29/2021  ? ? ? ?  Latest Ref Rng & Units 06/29/2021  ? 11:54 AM 03/10/2021  ? 10:25 AM 05/30/2020  ?  8:39 AM  ?Hepatic Function  ?Total Protein 6.0 - 8.3 g/dL 7.0  7.1   6.8    ?Albumin 3.5 - 5.2 g/dL 3.9   4.4   4.1    ?AST 0 - 37 U/L _0 ?ALT 0 - 53 U/L _1 ?Alk Phosphatase 39 - 117 U/L 57   62   53    ?Total Bilirubin 0.2 - 1.2 mg/dL 0.5   0.6   0.8    ? ? ?Lab Results  ?Component Value Date/Time  ? TSH 1.45 03/10/2021 10:25 AM  ? TSH 0.53 06/16/2018 09:35 AM  ? ? ? ?  Latest Ref Rng & Units 06/29/2021  ? 11:54 AM 05/30/2020  ?  8:39 AM 05/06/2019  ? 12:56 PM  ?CBC  ?WBC 4.0 - 10.5 K/uL 6.9   7.8   7.2    ?Hemoglobin 13.0 - 17.0 g/dL 11.4   11.1   10.8    ?Hematocrit 39.0 - 52.0 %  34.7   33.4   32.6    ?Platelets 150.0 - 400.0 K/uL 204.0   176.0   180.0    ? ? ?Lab Results  ?Component Value Date/Time  ? VD25OH 31.61 03/10/2021 10:25 AM  ? VD25OH 45 02/14/2010 08:15 PM  ? VD25OH 16 (L) 09/27/2009 08:15 PM  ? ? ?Clinical ASCVD: Yes  ?The ASCVD Risk score (Arnett DK, et al., 2019) failed to calculate for the following reasons: ?  The patient has a prior MI or stroke diagnosis   ? ? ? ?  03/10/2021  ?  8:08 AM 11/20/2019  ?  2:57 PM 06/16/2018  ?  9:07 AM  ?Depression screen PHQ 2/9  ?Decreased Interest 0 0 0  ?Down, Depressed, Hopeless 0 0 0  ?PHQ - 2 Score 0 0 0  ?Altered sleeping  0   ?Tired, decreased energy  0   ?Change in appetite  0   ?Feeling bad or failure about yourself   0   ?Trouble concentrating  0   ?Moving slowly or fidgety/restless  0   ?Suicidal thoughts  0   ?PHQ-9 Score  0   ?Difficult doing work/chores  Not difficult at all   ?  ? ?Social History  ? ?Tobacco Use  ?Smoking Status Former  ? Packs/day: 2.00  ? Years: 17.00  ? Pack years: 34.00  ? Types: Cigarettes  ? Quit date: 07/30/1984  ? Years since quitting: 37.2  ?Smokeless Tobacco Never  ? ?BP Readings from Last 3 Encounters:  ?07/06/21 (!) 110/50  ?06/29/21 90/62  ?03/10/21 (!) 108/56  ? ?Pulse Readings from Last 3 Encounters:  ?07/06/21 60  ?06/29/21 91  ?03/10/21 78  ? ?Wt Readings from Last 3 Encounters:  ?07/06/21 189 lb (85.7 kg)  ?06/29/21 184 lb 2 oz (83.5 kg)  ?03/10/21 182 lb 8 oz (82.8 kg)  ? ?BMI Readings from Last 3 Encounters:  ?07/06/21 28.74 kg/m?  ?06/29/21 29.05 kg/m?  ?03/10/21 28.80 kg/m?  ? ? ?Assessment/Interventions: Review of patient past medical history, allergies, medications, health status, including review of consultants reports, laboratory and other test data, was performed as part of comprehensive evaluation and provision of chronic care management services.  ? ?SDOH:  (Social Determinants of Health) assessments and interventions performed: Yes ?SDOH Interventions   ? ?Flowsheet Row Most Recent  Value  ?SDOH Interventions   ?Food Insecurity Interventions Intervention Not Indicated  ?Financial Strain Interventions Intervention Not Indicated  ? ?  ? ?SDOH Screenings  ? ?  Alcohol Screen: Not on file  ?Depression (PHQ2-9): Low Risk   ? PHQ-2 Score: 0  ?Financial Resource Strain: Low Risk   ? Difficulty of Paying Living Expenses: Not very hard  ?Food Insecurity: No Food Insecurity  ? Worried About Charity fundraiser in the Last Year: Never true  ? Ran Out of Food in the Last Year: Never true  ?Housing: Low Risk   ? Last Housing Risk Score: 0  ?Physical Activity: Not on file  ?Social Connections: Not on file  ?Stress: Not on file  ?Tobacco Use: Medium Risk  ? Smoking Tobacco Use: Former  ? Smokeless Tobacco Use: Never  ? Passive Exposure: Not on file  ?Transportation Needs: No Transportation Needs  ? Lack of Transportation (Medical): No  ? Lack of Transportation (Non-Medical): No  ? ? ?CCM Care Plan ? ?No Known Allergies ? ?Medications Reviewed Today   ? ? Reviewed by Nickolas Madrid, CMA (Certified Medical Assistant) on 07/06/21 at [place] List Status: <None>  ? ?Medication Order Taking? Sig Documenting Provider Last Dose Status Informant  ?Accu-Chek Softclix Lancets lancets [id number] No USE AS DIRECTED  TO TEST BLOOD SUGAR ONE TIME DAILY  AND AS NEEDED  ?Patient not taking: Reported on 07/06/2021  ? Elby Beck, FNP Not Taking Active   ?Alcohol Swabs (B-D SINGLE USE SWABS REGULAR) PADS [id number] No Use as instructed to clean area for glucose monitoring once daily and as needed.  Diagnosis:  E11.21  Non insulin-dependent  ?Patient not taking: Reported on 07/06/2021  ? Tonia Ghent, MD Not Taking Active Self  ?allopurinol (ZYLOPRIM) 100 MG tablet [id number] Yes TAKE 1/2 TABLET BY MOUTH ONCE DAILY Kennyth Arnold, FNP Taking Active   ?apixaban (ELIQUIS) 5 MG TABS tablet [id number] Yes Take 1 tablet (5 mg total) by mouth 2 (two) times daily. Evans Lance, MD Taking Active   ?atorvastatin (LIPITOR) 80  MG tablet [id number] Yes TAKE 1 TABLET (80 MG TOTAL) DAILY AT 6 PM. Elby Beck, FNP Taking Active   ?Blood Glucose Calibration (ACCU-CHEK AVIVA) SOLN [id number] No Use to calibrate blood glucose machine

## 2021-10-17 NOTE — Telephone Encounter (Signed)
[facility] needs refills for Entresto 24-26 mg. These were transferred to a mail order pharmacy and patient would like them back at [facility] now. ? ?Forwarding to cardiology office for refill. ? ? ?[facility] - [place], [place] - [address] ?[address] ?[place] [address] ?Phone: [phone] Fax: [phone] ? ? ?

## 2021-10-17 NOTE — Telephone Encounter (Signed)
Spoke with son Laverna Peace about pharmacy preference. He would like medications to stay with [facility], he believes that has been working out the best even though patient still misses doses sometimes. He thinks his father may have agreed to a new pharmacy without realizing what he was doing. ?

## 2021-10-17 NOTE — Telephone Encounter (Signed)
Noted thank you

## 2021-10-18 ENCOUNTER — Telehealth: Payer: Self-pay

## 2021-10-18 MED ORDER — ENTRESTO 24-26 MG PO TABS: ORAL_TABLET | ORAL | 0 refills | Status: DC

## 2021-10-18 NOTE — Telephone Encounter (Signed)
-----   Message from Charlton Haws, [facility] sent at 10/18/2021 11:02 AM EDT ----- ?Regarding: Call [facility] ?Please call [facility] pharmacy: [phone] ?Tell them you are calling on behalf of the patient. If they need to talk to patient/kin, you can ask patient's son (phone number in chart) to call [facility] and cancel rx's. ? ?They have been filling meds for patient on and off, we need them to cancel all prescriptions and stop filling for him because it is only confusing him. [facility] is taking care of his pill packs and he really need to stick with just one pharmacy. Patient and son have opted to continue with [facility] since it is local and tied in with PCP office. ? ?

## 2021-10-18 NOTE — Telephone Encounter (Addendum)
Patient also never started Eliquis (prescribed 08/2020, patient never picked [facility]). ? ?Per recent cardiology notes, Afib burden is low and it was advised to continue Eliquis. ? ?Routing to cardiologist for input on whether Eliquis should be continued, since it was never started. ? ?Please refill to [facility] if it is to be continued. ? ?[facility] - [place], [place] - [address] ?[address] ?[place] [address] ?Phone: [phone] Fax: [phone] ? ? ?

## 2021-10-18 NOTE — Patient Instructions (Signed)
Visit Information ? ?Phone number for Pharmacist: [phone] ? ? Goals Addressed   ? ?  ?  ?  ?  ? This Visit's Progress  ?  Manage My Medicine     ?  Timeframe:  Long-Range Goal ?Priority:  High ?Start Date:      10/18/21                       ?Expected End Date:     10/19/22                ? ?Follow Up Date June 2023 ?  ?- call for medicine refill 2 or 3 days before it runs out ?- call if I am sick and can't take my medicine ?- keep a list of all the medicines I take; vitamins and herbals too ?-Utilize [facility] for medication synchronization, packaging and delivery ? ?  ?Why is this important?   ?These steps will help you keep on track with your medicines. ?  ?Notes:  ?  ? ?  ? ? ?Care Plan : [place]  ?Updates made by Charlton Haws, RPH since 10/18/2021 12:00 AM  ?  ? ?Problem: Hypertension, Hyperlipidemia, Diabetes, Atrial Fibrillation, Heart Failure, Coronary Artery Disease, and Chronic Kidney Disease   ?Priority: High  ?  ? ?Long-Range Goal: Disease mgmt   ?Start Date: 10/18/2021  ?Expected End Date: 10/19/2022  ?This Visit's Progress: On track  ?Priority: High  ?Note:   ?Current Barriers:  ?Unable to independently monitor therapeutic efficacy ?Unable to self administer medications as prescribed ? ?Pharmacist Clinical Goal(s):  ?Patient will achieve adherence to monitoring guidelines and medication adherence to achieve therapeutic efficacy ?achieve ability to self administer medications as prescribed through use of pill packs as evidenced by patient report through collaboration with PharmD and provider.  ? ?Interventions: ?1:1 collaboration with Michela Pitcher, NP regarding development and update of comprehensive plan of care as evidenced by provider attestation and co-signature ?Inter-disciplinary care team collaboration (see longitudinal plan of care) ?Comprehensive medication review performed; medication list updated in electronic medical record ? ?Hypertension / NSVT (BP goal  <140/90) ?-Controlled - BP at goal in clinic; per pharmacy refill data, Delene Loll has not been refilled since 07/13/21 per [facility]. Pt should be getting all medications through [facility] to limit confusion. ?-Last ejection fraction: 25-30% (Date: 07/2017) ?-HF type: Systolic; NYHA Class: II-III ?-Current treatment: ?Carvedilol 3.125 mg BID - Appropriate, Effective, Safe, Accessible ?Furosemide 20 mg daily - Appropriate, Effective, Safe, Accessible ?Entresto 24-26 mg BID - Appropriate, Effective, Safe, Query Accessible (overdue for refill) ?-Medications previously tried: n/a  ?-Denies hypotensive/hypertensive symptoms ?-Educated on BP goals and benefits of medications for prevention of heart attack, stroke and kidney damage; ?-Counseled to monitor BP at home periodically ?-Recommended to continue current medication; coordinate with cardiology to refill Entresto via [facility] ? ?Hyperlipidemia: (LDL goal < 70) ?-Not ideally controlled - LDL 109 (06/2021) above goal given history of CAD; compliance with all medications is an issue, will work with pharmacy (pill packs) and family to improve this as much as possible ?-Hx NSTEMI ?-Current treatment: ?Atorvastatin 80 mg daily - Appropriate, Query Effective ?Ezetimibe 10 mg daily -Appropriate, Query Effective ?-Medications previously tried: n/a  ?-Educated on Cholesterol goals; Benefits of statin for ASCVD risk reduction; ?-Recommended to continue current medication; encouraged compliance with pill packs ? ?Atrial Fibrillation (Goal: prevent stroke and major bleeding) ?-Not ideally controlled - pt was prescribed Eliquis last year but [facility] and has not [facility]; per cardiology overall Afib burden is low, and it was advised to continue Eliquis at last visit 06/2021.  ?-CHADSVASC: 6 (age2, male CHF, HTN, CAD, DM) ?-Current treatment: ?Amiodarone 200 mg daily - Appropriate, Effective, Safe, Accessible ?Carvedilol 3.125 mg BID - Appropriate, Effective,  Safe, Accessible ?Eliquis 5 mg BID - not taking ?-Medications previously tried: n/a ?-Counseled on increased risk of stroke due to Afib and benefits of anticoagulation for stroke prevention; ?-Coordinate with cardiology re: continuation of Eliquis; requested refill to [facility] if it is to be continued ? ?Diabetes (A1c goal <8%) ?-Controlled - A1c 7.9 (06/2021), improved from previous 8.9%; more conservative A1c goal chosen due to age, multiple comorbidities and impaired cognition ?-Current medications: ?Glipizide XL 10 mg daily AM - Appropriate, Effective, Safe, Accessible ?-Medications previously tried: metformin  ?-Educated on A1c and blood sugar goals; ?-Counseled to check feet daily and get yearly eye exams ?-Recommended to continue current medication ? ?Cognitive impairment (Goal: slow progression) ?-Controlled - pt recently started donepezil, it has been added to pill packs  ?-Current treatment  ?Donepezil 5 mg daily HS - Appropriate, Effective, Safe, Accessible ?-Medications previously tried: n/a  ?-Recommended to continue current medication ? ?Health Maintenance ?-Overall questionable medication adherence even with pill packs due to memory issues; per son, patient often forgets to take the packs even though the box is placed on his side table near his chair in living room ?-Encouraged son to monitor medication adherence as able ? ?Patient Goals/Self-Care Activities ?Patient will:  ?- take medications as prescribed as evidenced by patient report and record review ?focus on medication adherence by pill packs ? ?  ?  ? ?The patient verbalized understanding of instructions, educational materials, and care plan provided today and declined offer to receive copy of patient instructions, educational materials, and care plan.  ?Telephone follow up appointment with pharmacy team member scheduled for: 3 months ? ?Charlene Brooke, PharmD, BCACP ?Clinical Pharmacist ?[facility] ?[phone] ?   ?

## 2021-10-18 NOTE — Progress Notes (Signed)
Spoke with representative at [facility] order pharmacy [phone]. Patient is unenrolled and all medications cancelled for any upcoming delivery.The patient has inactive status with [facility].  Any remaining prescriptions to be sent to [facility]. ? ?Charlene Brooke, CPP notified ? ?Kenard Morawski, CCMA ?Health concierge  ?[phone]  ?

## 2021-10-19 ENCOUNTER — Telehealth: Payer: Self-pay

## 2021-10-19 NOTE — Telephone Encounter (Signed)
LMOVM for patient to send missed ICM transmission. 

## 2021-10-20 ENCOUNTER — Other Ambulatory Visit: Payer: Self-pay | Admitting: Nurse Practitioner

## 2021-10-20 NOTE — Progress Notes (Signed)
No ICM remote transmission received for 10/16/2021 and next ICM transmission scheduled for 11/13/2021.   ?

## 2021-10-23 ENCOUNTER — Ambulatory Visit: Payer: Medicare HMO | Admitting: *Deleted

## 2021-10-23 ENCOUNTER — Telehealth: Payer: Self-pay

## 2021-10-23 DIAGNOSIS — G3184 Mild cognitive impairment, so stated: Secondary | ICD-10-CM

## 2021-10-23 DIAGNOSIS — E1121 Type 2 diabetes mellitus with diabetic nephropathy: Secondary | ICD-10-CM

## 2021-10-23 DIAGNOSIS — N183 Chronic kidney disease, stage 3 unspecified: Secondary | ICD-10-CM

## 2021-10-23 NOTE — Chronic Care Management (AMB) (Signed)
?Chronic Care Management  ? ? Clinical Social Work Note ? ?10/23/2021 ?Name: Randy [name] MRN: [id number] DOB: 07-20-1946 ? ?Randy [name] is a 76 y.o. year old male who is a primary care patient of Marrion, [place], NP. The CCM team was consulted to assist the patient with chronic disease management and/or care coordination needs related to: [employer] , Mental Health Counseling and Resources, and Caregiver Stress.  ? ?Engaged with patient by telephone for follow up visit in response to provider referral for social work chronic care management and care coordination services.  ? ?Consent to Services:  ?The patient was given information about Chronic Care Management services, agreed to services, and gave verbal consent prior to initiation of services.  Please see initial visit note for detailed documentation.  ? ?Patient agreed to services and consent obtained.  ? ?Assessment: Review of patient past medical history, allergies, medications, and health status, including review of relevant consultants reports was performed today as part of a comprehensive evaluation and provision of chronic care management and care coordination services.    ? ?SDOH (Social Determinants of Health) assessments and interventions performed:   ? ?Advanced Directives Status: Not addressed in this encounter. ? ?CCM Care Plan ? ?No Known Allergies ? ?Outpatient Encounter Medications as of 10/23/2021  ?Medication Sig  ? Accu-Chek Softclix Lancets lancets USE AS DIRECTED  TO TEST BLOOD SUGAR ONE TIME DAILY  AND AS NEEDED  ? Alcohol Swabs (B-D SINGLE USE SWABS REGULAR) PADS Use as instructed to clean area for glucose monitoring once daily and as needed.  Diagnosis:  E11.21  Non insulin-dependent  ? allopurinol (ZYLOPRIM) 100 MG tablet Take 0.5 tablets (50 mg total) by mouth daily.  ? amiodarone (PACERONE) 200 MG tablet Take 1 tablet (200 mg total) by mouth daily.  ? apixaban (ELIQUIS) 5 MG TABS tablet Take 1 tablet (5 mg total) by mouth 2  (two) times daily. (Patient not taking: Reported on 10/17/2021)  ? atorvastatin (LIPITOR) 80 MG tablet TAKE 1 TABLET (80 MG TOTAL) DAILY AT 6 PM.  ? Blood Glucose Calibration (ACCU-CHEK AVIVA) SOLN Use to calibrate blood glucose machine as recommended.  Diagnosis:  E11.21  Non-insulin dependent.  ? Blood Glucose Monitoring Suppl (ACCU-CHEK GUIDE) w/Device KIT Use to check blood sugar once daily.  ? carvedilol (COREG) 3.125 MG tablet Take 1 tablet (3.125 mg total) by mouth 2 (two) times daily.  ? donepezil (ARICEPT) 5 MG tablet TAKE ONE TABLET BY MOUTH EVERY EVENING  ? ezetimibe (ZETIA) 10 MG tablet TAKE ONE TABLET BY MOUTH EVERY MORNING  ? finasteride (PROSCAR) 5 MG tablet Take 1 tablet (5 mg total) by mouth daily.  ? furosemide (LASIX) 20 MG tablet TAKE ONE TABLET BY MOUTH EVERY MORNING  ? gabapentin (NEURONTIN) 300 MG capsule TAKE TWO CAPSULES BY MOUTH TWICE DAILY  ? glipiZIDE (GLUCOTROL XL) 10 MG 24 hr tablet Take 1 tablet (10 mg total) by mouth daily with breakfast.  ? glucose blood (ACCU-CHEK AVIVA PLUS) test strip Test Blood Sugar 1 time daily: Dx:E11.27  ? sacubitril-valsartan (ENTRESTO) 24-26 MG TAKE ONE TABLET BY MOUTH EVERY MORNING and TAKE ONE TABLET BY MOUTH EVERY EVENING  ? tamsulosin (FLOMAX) 0.4 MG CAPS capsule Take 1 capsule (0.4 mg total) by mouth daily.  ? ?No facility-administered encounter medications on file as of 10/23/2021.  ? ? ?Patient Active Problem List  ? Diagnosis Date Noted  ? Mild cognitive impairment 03/10/2021  ? Mass of right foot 03/10/2021  ? Idiopathic gout of  multiple sites 10/07/2018  ? Grief 06/16/2018  ? Dizziness 06/16/2018  ? Allergic rhinitis 04/24/2018  ? S/P CABG x 3 09/02/2017  ? Leukocytosis 08/27/2017  ? Visit for well man health check 05/07/2017  ? COPD (chronic obstructive pulmonary disease) ([place]) 05/07/2017  ? Chronic systolic CHF (congestive heart failure) ([place]) 10/18/2016  ? Generalized OA 06/22/2014  ? CKD (chronic kidney disease) stage 3, GFR 30-59 ml/min (HCC)    ? Synovial cyst of lumbar spine 07/17/2013  ?  Class: Diagnosis of  ? Osteoarthritis of left hip 09/17/2012  ? Automatic implantable cardioverter-defibrillator in situ - [employer] Jude 05/21/2012  ? Chronic systolic heart failure ([place]) 05/01/2011  ? IMPOTENCE OF ORGANIC ORIGIN 02/20/2010  ? Vitamin D deficiency 02/14/2010  ? Type 2 diabetes with nephropathy ([place]) 01/11/2010  ? Implantable cardioverter-defibrillator (ICD) in situ 05/10/2009  ? HLD (hyperlipidemia) 02/24/2008  ? Essential hypertension 02/24/2008  ? Non-ST elevation (NSTEMI) myocardial infarction ([place]) 02/24/2008  ? HYPERSOMNIA UNSPECIFIED 02/24/2008  ? ? ?Conditions to be addressed/monitored: Dementia; Limited social support, Level of care concerns, Social Isolation, and Limited access to caregiver ? ?Care Plan : LCSW Plan of Care  ?Updates made by [name], LCSW since 10/23/2021 12:00 AM  ?  ? ?Problem: Mobility and Independence   ?  ? ?Long-Range Goal: Mobility and Independence Optimized   ?Start Date: 05/24/2021  ?Expected End Date: 12/26/2021  ?This Visit's Progress: On track  ?Recent Progress: On track  ?Priority: High  ?Note:   ?Current barriers:   ?Patient in need of assistance with connecting to community resources for Limited social support, Level of care concerns, and Cognitive Deficits ?Acknowledges deficits with meeting this unmet need ?Patient is unable to independently navigate community resource options without care coordination support ?Clinical Goals:  ?patient will demonstrate improved health management independence as evidenced byfamily report, overall activity/outcomes ?Clinical Interventions:  ?10/23/21- CSW spoke with pt's son and pt (out to lunch) who deny any concerns at this time for CSW. "Things are going well...he's doing good.  Son does say he is "about 50/50"on taking his medications like he is supposed to. Will ask Embedded Pharmacist outreach son to determine if any options for improving compliance.  ? ?09/15/21- CSW  spoke with pt's son who reports pt is doing ok; "maintaining and doing pretty good". Pt remains living alone; does drive to golf course, restaurants and grocery store nearby.   He feels he is doing ok and does acknowledge that eventually he may need to seek placement for him.  CSW reviewed the process of seeking placement as well as the financial pieces.  ?Son denies any concerns at this time- CSW will follow up again in about 4-6 weeks to further assess. ? ? Collaboration with Michela Pitcher, NP regarding development and update of comprehensive plan of care as evidenced by provider attestation and co-signature ?Inter-disciplinary care team collaboration (see longitudinal plan of care) ?Assessment of needs, barriers , agencies contacted, as well as how impacting  ?Review various resources, discussed options and provided patient information about Limited social support, Level of care concerns, Social Isolation, and Memory Deficits ?Collaborated with appropriate clinical care team members regarding patient needs Memory Deficits, Dementia ?Referral placed via [facility] ?Patient interviewed and appropriate assessments performed ?Other interventions provided: ?Reviewed mental health medications with patient and discussed importance of compliance:  ?Increase in actives / exercise encouraged  ?Patient Goals:  ?- continue taking medications as prescribed ?Follow Up Plan: SW will follow up with patient by phone  over the next 45 days   ?  ?  ? ?Follow Up Plan: Appointment scheduled for SW follow up with client by phone on: 11/27/21 ?     ?Eduard Clos MSW, LCSW ?Licensed Clinical Social Worker ?[place]   ?[phone]  ? ? ?

## 2021-10-23 NOTE — Telephone Encounter (Signed)
[facility] alert for AT/AF, ATP therapy ?6 NSVT, appear to be AF with RVR ?3/18 @ 17:30, AF with RVR, ATP delivered x2 slowing ventricular rate. 3/19 @ 10:57, AF with RVR, ATP delivered x1 slowing ventricular rate ?Longest AF episode 11hrs, 53mn. Hx of PAF, burden 7.9%, Amiodarone, Coreg, Eliquis ?Presenting regular AS/VS. ? ?Attempted to contact patient to assess. No answer, LMTCB. ? ? ? ? ? ? ? ? ? ?

## 2021-10-23 NOTE — Patient Instructions (Signed)
Visit Information ? ?Thank you for taking time to visit with me today. Please don't hesitate to contact me if I can be of assistance to you before our next scheduled telephone appointment. ? ?Following are the goals we discussed today:  ?(Copy and paste patient goals from clinical care plan here) ? ?Our next appointment is by telephone on 11/27/21 ? ?Please call the care guide team at [phone] if you need to cancel or reschedule your appointment.  ? ?If you are experiencing a Mental Health or [place] or need someone to talk to, please call the [place] National Suicide Prevention Lifeline: (808)037-7524 or TTY: (708)728-5693 TTY [phone]) to talk to a trained counselor ?go to [facility] [address], [place] [phone]) ?call 911  ? ?Patient verbalizes understanding of instructions and care plan provided today and agrees to view in [place]. Active MyChart status confirmed with patient.   ?Eduard Clos MSW, LCSW ?Licensed Clinical Social Worker ?[place]   ?[phone]  ?

## 2021-10-24 ENCOUNTER — Telehealth: Payer: Self-pay

## 2021-10-24 MED ORDER — APIXABAN 5 MG PO TABS: 5.0000 mg | ORAL_TABLET | Freq: Two times a day (BID) | ORAL | 5 refills | Status: DC

## 2021-10-24 NOTE — Chronic Care Management (AMB) (Signed)
? ? ?Chronic Care Management ?Pharmacy Assistant  ? ?Name: Randy [name]  MRN: [id number] DOB: 1945/11/27 ? ? ?Reason for Encounter: Medication Adherence and Delivery Coordination  ?  ?Recent office visits:  ?10/23/21-Family Medicine- LCSW- Telephone encounter-spoke with son -no medication changes ? ?Recent consult visits:  ?None since last CCM contact ? ?Hospital visits:  ?None in previous 6 months ? ?Medications: ?Outpatient Encounter Medications as of 10/24/2021  ?Medication Sig  ? Accu-Chek Softclix Lancets lancets USE AS DIRECTED  TO TEST BLOOD SUGAR ONE TIME DAILY  AND AS NEEDED  ? Alcohol Swabs (B-D SINGLE USE SWABS REGULAR) PADS Use as instructed to clean area for glucose monitoring once daily and as needed.  Diagnosis:  E11.21  Non insulin-dependent  ? allopurinol (ZYLOPRIM) 100 MG tablet Take 0.5 tablets (50 mg total) by mouth daily.  ? amiodarone (PACERONE) 200 MG tablet Take 1 tablet (200 mg total) by mouth daily.  ? apixaban (ELIQUIS) 5 MG TABS tablet Take 1 tablet (5 mg total) by mouth 2 (two) times daily. (Patient not taking: Reported on 10/17/2021)  ? atorvastatin (LIPITOR) 80 MG tablet TAKE 1 TABLET (80 MG TOTAL) DAILY AT 6 PM.  ? Blood Glucose Calibration (ACCU-CHEK AVIVA) SOLN Use to calibrate blood glucose machine as recommended.  Diagnosis:  E11.21  Non-insulin dependent.  ? Blood Glucose Monitoring Suppl (ACCU-CHEK GUIDE) w/Device KIT Use to check blood sugar once daily.  ? carvedilol (COREG) 3.125 MG tablet Take 1 tablet (3.125 mg total) by mouth 2 (two) times daily.  ? donepezil (ARICEPT) 5 MG tablet TAKE ONE TABLET BY MOUTH EVERY EVENING  ? ezetimibe (ZETIA) 10 MG tablet TAKE ONE TABLET BY MOUTH EVERY MORNING  ? finasteride (PROSCAR) 5 MG tablet Take 1 tablet (5 mg total) by mouth daily.  ? furosemide (LASIX) 20 MG tablet TAKE ONE TABLET BY MOUTH EVERY MORNING  ? gabapentin (NEURONTIN) 300 MG capsule TAKE TWO CAPSULES BY MOUTH TWICE DAILY  ? glipiZIDE (GLUCOTROL XL) 10 MG 24 hr tablet Take 1  tablet (10 mg total) by mouth daily with breakfast.  ? glucose blood (ACCU-CHEK AVIVA PLUS) test strip Test Blood Sugar 1 time daily: Dx:E11.27  ? sacubitril-valsartan (ENTRESTO) 24-26 MG TAKE ONE TABLET BY MOUTH EVERY MORNING and TAKE ONE TABLET BY MOUTH EVERY EVENING  ? tamsulosin (FLOMAX) 0.4 MG CAPS capsule Take 1 capsule (0.4 mg total) by mouth daily.  ? ?No facility-administered encounter medications on file as of 10/24/2021.  ? ? ?BP Readings from Last 3 Encounters:  ?07/06/21 (!) 110/50  ?06/29/21 90/62  ?03/10/21 (!) 108/56  ?  ?Lab Results  ?Component Value Date  ? HGBA1C 7.9 (A) 06/29/2021  ?  ? ? ?No OVs, Consults, or hospital visits since last care coordination call / Pharmacist visit. ?No medication changes indicated ? ? ?Last adherence delivery date:10/05/21     ? ?Patient is due for next adherence delivery on: 11/03/21 ? ? ?Spoke with patient on 10/25/21 reviewed medications and coordinated delivery. Spoke with son Laverna Peace ? ?This delivery to include: Adherence Packaging  30 Days  ?Packs: ?Carvedilol 3.125 mg - 1 BID (breakfast and evening meal) ?Gabapentin 300 mg - 2 BID (breakfast and evening meal) ?Furosemide 20 mg - 1 daily (breakfast) ?Allopurinol 100 mg - 1/2 tablet daily (breakfast) ?Tamsulosin 0.85m - 1 daily (evening meal) ?Finasteride 5 mg - 1 daily (breakfast) ?Atorvastatin 80 mg - 1 tablet (evening meal) ?Glipizide ER 10 mg - 1 daily  (breakfast) ?Zetia 10 mg- 1 tablet daily (breakfast)  ?  Donepezil 24m -1 tablet (evening meal) ?Amiodarone 2028m- 1 tablet (breakfast) ?Eliquis 55m31m take 1 tablet breakfast 1 tablet evening meal  ? ?VIAL medications: ?none ? ? ?Patient declined the following medications this month: ?Entresto 24-54m40mke 1 tablet am 1 tablet pm -Patient has supply on hand from mail order  ? ? ?Any concerns about your medications? No ? ?How often do you forget or accidentally miss a dose? Rarely ? ?Do you use a pillbox? No ? ?Is patient in packaging Yes ? What is the date on your  next pill pack? Patient unavailable to read date on pack  ? Any concerns or issues with your packaging? Packaging is keeping the patient compliant ? ? ?Refills requested from providers include: Eliquis ? ?Confirmed delivery date of 11/03/21, advised patient that pharmacy will contact them the morning of delivery. ? ?Recent blood pressure readings are as follows: none available  ? ? ?Recent blood glucose readings are as follows: None available ? ? ?Annual wellness visit in last year? No ?Most Recent BP reading:110/50  60-P ? ?If Diabetic: ?Most recent A1C reading:7.9  ?Last eye exam / retinopathy screening:2020 ?Last diabetic foot exam: never done ? ?LindCharlene BrookeP notified ? ?Renton Berkley, CCMA ?Health concierge  ?[phone]

## 2021-10-24 NOTE — Addendum Note (Signed)
Addended by: Brynda Peon on: 10/24/2021 11:35 AM ? ? Modules accepted: Orders ? ?

## 2021-10-24 NOTE — Telephone Encounter (Signed)
Pt last saw Barrington Ellison, [place] on 07/06/21, last labs 06/29/21 Creat 1.66, age 76, weight 85.7, based on specified criteria pt is on appropriate dosage of Eliquis '5mg'$  BID for afib.  Will refill rx. ?

## 2021-10-26 NOTE — Telephone Encounter (Addendum)
Attempted to contact patient regarding ICD therapies no answer at home phone, mobile number voicemail is a business, did not leave a VM at mobile number will send patient a letter to schedule appointment in office.  ? ?Also attempted patients son in order to get a good phone number to reach patient as we have tried to reach him several times already, this number went to VM left message asking if patients son had good contact number for patient.  ?

## 2021-10-27 ENCOUNTER — Telehealth: Payer: Self-pay

## 2021-10-27 DIAGNOSIS — Z7984 Long term (current) use of oral hypoglycemic drugs: Secondary | ICD-10-CM | POA: Diagnosis not present

## 2021-10-27 DIAGNOSIS — I472 Ventricular tachycardia, unspecified: Secondary | ICD-10-CM

## 2021-10-27 DIAGNOSIS — E785 Hyperlipidemia, unspecified: Secondary | ICD-10-CM | POA: Diagnosis not present

## 2021-10-27 DIAGNOSIS — I10 Essential (primary) hypertension: Secondary | ICD-10-CM | POA: Diagnosis not present

## 2021-10-27 DIAGNOSIS — E1159 Type 2 diabetes mellitus with other circulatory complications: Secondary | ICD-10-CM | POA: Diagnosis not present

## 2021-10-27 DIAGNOSIS — I4891 Unspecified atrial fibrillation: Secondary | ICD-10-CM | POA: Diagnosis not present

## 2021-10-27 NOTE — Telephone Encounter (Signed)
I spoke with the patient son and he states he is going to have the patient give [place] a call. ?

## 2021-11-01 ENCOUNTER — Telehealth: Payer: Self-pay

## 2021-11-01 NOTE — Telephone Encounter (Signed)
Incoming call from patient and son from left message 10/23/21. Discussed AF with RVR which fell in VT1 zone and ATP therapy delivered. Patient has short-term memory loss and is not able to recall event. Of note he was a no show to appt with [name], [place] 09/13/21 for VT therapy. Today patient is rescheduled with [name] 11/22/21 at 8:40. Son and patient aware of appointment day and time. Also reminded of need for manual transmission 3/17 for ICM clinic. ED precautions and shock plan reviewed.  ?

## 2021-11-01 NOTE — Telephone Encounter (Signed)
The patient son called back. I let him speak with Portia, rn. ?

## 2021-11-13 ENCOUNTER — Ambulatory Visit (INDEPENDENT_AMBULATORY_CARE_PROVIDER_SITE_OTHER): Payer: Medicare HMO | Admitting: *Deleted

## 2021-11-13 DIAGNOSIS — E1121 Type 2 diabetes mellitus with diabetic nephropathy: Secondary | ICD-10-CM

## 2021-11-13 DIAGNOSIS — G3184 Mild cognitive impairment, so stated: Secondary | ICD-10-CM

## 2021-11-13 DIAGNOSIS — I10 Essential (primary) hypertension: Secondary | ICD-10-CM

## 2021-11-14 NOTE — Patient Instructions (Signed)
Visit Information ? ?Thank you for taking time to visit with me today. Please don't hesitate to contact me if I can be of assistance to you before our next scheduled telephone appointment. ?  ?Our next appointment is by telephone on 12/18/21  ? ?Please call the care guide team at [phone] if you need to cancel or reschedule your appointment.  ? ?If you are experiencing a Mental Health or [place] or need someone to talk to, please call the Suicide and Crisis Lifeline: 988 ?call 911  ? ?The patient verbalized understanding of instructions, educational materials, and care plan provided today and declined offer to receive copy of patient instructions, educational materials, and care plan.  ?Eduard Clos MSW, LCSW ?Licensed Clinical Social Worker ?[place]   ?[phone]  ?

## 2021-11-14 NOTE — Chronic Care Management (AMB) (Signed)
?Chronic Care Management  ? ? Clinical Social Work Note ? ?11/14/2021 ?Name: Randy [name] MRN: [id number] DOB: Sep 02, 1945 ? ?Randy [name] is a 76 y.o. year old male who is a primary care patient of Rayvion, Stumph, NP. The CCM team was consulted to assist the patient with chronic disease management and/or care coordination needs related to: [employer] , Level of Care Concerns, and Caregiver Stress.  ? ?Engaged with patient by telephone for follow up visit in response to provider referral for social work chronic care management and care coordination services.  ? ?Consent to Services:  ?The patient was given information about Chronic Care Management services, agreed to services, and gave verbal consent prior to initiation of services.  Please see initial visit note for detailed documentation.  ? ?Patient agreed to services and consent obtained.  ? ?Assessment: Review of patient past medical history, allergies, medications, and health status, including review of relevant consultants reports was performed today as part of a comprehensive evaluation and provision of chronic care management and care coordination services.    ? ?SDOH (Social Determinants of Health) assessments and interventions performed:   ? ?Advanced Directives Status: Not addressed in this encounter. ? ?CCM Care Plan ? ?No Known Allergies ? ?Outpatient Encounter Medications as of 11/13/2021  ?Medication Sig  ? Accu-Chek Softclix Lancets lancets USE AS DIRECTED  TO TEST BLOOD SUGAR ONE TIME DAILY  AND AS NEEDED  ? Alcohol Swabs (B-D SINGLE USE SWABS REGULAR) PADS Use as instructed to clean area for glucose monitoring once daily and as needed.  Diagnosis:  E11.21  Non insulin-dependent  ? allopurinol (ZYLOPRIM) 100 MG tablet Take 0.5 tablets (50 mg total) by mouth daily.  ? amiodarone (PACERONE) 200 MG tablet Take 1 tablet (200 mg total) by mouth daily.  ? apixaban (ELIQUIS) 5 MG TABS tablet Take 1 tablet (5 mg total) by mouth 2 (two) times  daily.  ? atorvastatin (LIPITOR) 80 MG tablet TAKE 1 TABLET (80 MG TOTAL) DAILY AT 6 PM.  ? Blood Glucose Calibration (ACCU-CHEK AVIVA) SOLN Use to calibrate blood glucose machine as recommended.  Diagnosis:  E11.21  Non-insulin dependent.  ? Blood Glucose Monitoring Suppl (ACCU-CHEK GUIDE) w/Device KIT Use to check blood sugar once daily.  ? carvedilol (COREG) 3.125 MG tablet Take 1 tablet (3.125 mg total) by mouth 2 (two) times daily.  ? donepezil (ARICEPT) 5 MG tablet TAKE ONE TABLET BY MOUTH EVERY EVENING  ? ezetimibe (ZETIA) 10 MG tablet TAKE ONE TABLET BY MOUTH EVERY MORNING  ? finasteride (PROSCAR) 5 MG tablet Take 1 tablet (5 mg total) by mouth daily.  ? furosemide (LASIX) 20 MG tablet TAKE ONE TABLET BY MOUTH EVERY MORNING  ? gabapentin (NEURONTIN) 300 MG capsule TAKE TWO CAPSULES BY MOUTH TWICE DAILY  ? glipiZIDE (GLUCOTROL XL) 10 MG 24 hr tablet Take 1 tablet (10 mg total) by mouth daily with breakfast.  ? glucose blood (ACCU-CHEK AVIVA PLUS) test strip Test Blood Sugar 1 time daily: Dx:E11.27  ? sacubitril-valsartan (ENTRESTO) 24-26 MG TAKE ONE TABLET BY MOUTH EVERY MORNING and TAKE ONE TABLET BY MOUTH EVERY EVENING  ? tamsulosin (FLOMAX) 0.4 MG CAPS capsule Take 1 capsule (0.4 mg total) by mouth daily.  ? ?No facility-administered encounter medications on file as of 11/13/2021.  ? ? ?Patient Active Problem List  ? Diagnosis Date Noted  ? Mild cognitive impairment 03/10/2021  ? Mass of right foot 03/10/2021  ? Idiopathic gout of multiple sites 10/07/2018  ? Grief 06/16/2018  ?  Dizziness 06/16/2018  ? Allergic rhinitis 04/24/2018  ? S/P CABG x 3 09/02/2017  ? Leukocytosis 08/27/2017  ? Visit for well man health check 05/07/2017  ? COPD (chronic obstructive pulmonary disease) ([facility]) 05/07/2017  ? Chronic systolic CHF (congestive heart failure) ([facility]) 10/18/2016  ? Generalized OA 06/22/2014  ? CKD (chronic kidney disease) stage 3, GFR 30-59 ml/min (HCC)   ? Synovial cyst of lumbar spine 07/17/2013  ?  Class:  Diagnosis of  ? Osteoarthritis of left hip 09/17/2012  ? Automatic implantable cardioverter-defibrillator in situ - St Jude 05/21/2012  ? Chronic systolic heart failure ([facility]) 05/01/2011  ? IMPOTENCE OF ORGANIC ORIGIN 02/20/2010  ? Vitamin D deficiency 02/14/2010  ? Type 2 diabetes with nephropathy ([facility]) 01/11/2010  ? Implantable cardioverter-defibrillator (ICD) in situ 05/10/2009  ? HLD (hyperlipidemia) 02/24/2008  ? Essential hypertension 02/24/2008  ? Non-ST elevation (NSTEMI) myocardial infarction ([facility]) 02/24/2008  ? HYPERSOMNIA UNSPECIFIED 02/24/2008  ? ? ?Conditions to be addressed/monitored: Dementia; Limited social support, Level of care concerns, ADL IADL limitations, Limited access to caregiver, Cognitive Deficits, and Lacks knowledge of community resource:   ? ?Care Plan : LCSW Plan of Care  ?Updates made by [name], LCSW since 11/14/2021 12:00 AM  ?  ? ?Problem: Mobility and Independence   ?  ? ?Long-Range Goal: Mobility and Independence Optimized   ?Start Date: 05/24/2021  ?Expected End Date: 12/26/2021  ?This Visit's Progress: On track  ?Recent Progress: On track  ?Priority: High  ?Note:   ?Current barriers:   ?Patient in need of assistance with connecting to community resources for Limited social support, Level of care concerns, and Cognitive Deficits ?Acknowledges deficits with meeting this unmet need ?Patient is unable to independently navigate community resource options without care coordination support ?Clinical Goals:  ?patient will demonstrate improved health management independence as evidenced byfamily report, overall activity/outcomes ?Clinical Interventions:  ?11/13/21- CSW spoke with pt's son and pt (out to lunch) who deny any concerns at this time for CSW.  Pt is able to drive to meet son for lunch and also continues to mow and take care of the animals.   ?Pt had a birthday last week; "7 years old".  Son does not have any concerns at this time and asks for a follow up call in about  a month.  ? ?09/15/21- CSW spoke with pt's son who reports pt is doing ok; "maintaining and doing pretty good". Pt remains living alone; does drive to golf course, restaurants and grocery store nearby.   He feels he is doing ok and does acknowledge that eventually he may need to seek placement for him.  CSW reviewed the process of seeking placement as well as the financial pieces.  ?Son denies any concerns at this time- CSW will follow up again in about 4-6 weeks to further assess. ? ? Collaboration with Michela Pitcher, NP regarding development and update of comprehensive plan of care as evidenced by provider attestation and co-signature ?Inter-disciplinary care team collaboration (see longitudinal plan of care) ?Assessment of needs, barriers , agencies contacted, as well as how impacting  ?Review various resources, discussed options and provided patient information about Limited social support, Level of care concerns, Social Isolation, and Memory Deficits ?Collaborated with appropriate clinical care team members regarding patient needs Memory Deficits, Dementia ?Referral placed via [facility] ?Patient interviewed and appropriate assessments performed ?Other interventions provided: ?Reviewed mental health medications with patient and discussed importance of compliance:  ?Increase in actives / exercise encouraged  ?Patient  Goals:  ?- continue taking medications as prescribed ?Follow Up Plan: SW will follow up with patient by phone over the next 45 days   ?  ?  ? ?Follow Up Plan: Appointment scheduled for SW follow up with client by phone on: 12/18/21 ?     ?Eduard Clos MSW, LCSW ?Licensed Clinical Social Worker ?[place]    ?[phone]  ? ? ?

## 2021-11-15 NOTE — [facility] Notes (Deleted)
Electrophysiology Office Note Date: 11/15/2021  ID:  Randy [name], DOB 04/10/1946, MRN [id number]  PCP: Michela Pitcher, NP Primary Cardiologist: None Electrophysiologist: Cristopher Peru, MD   CC: Routine ICD follow-up  Randy [name] is a 76 y.o. male seen today for Cristopher Peru, MD for routine electrophysiology followup.  Since last being seen in our clinic the patient reports doing OK. He does think he got shocked by his ICD 6-8 weeks ago per son. Called him to tell him. Was in the bathroom at the time. Doesn't think he passed out but doesn't really remember. He frequently forgets his medications, especially the am doses despite using pill boxes. He has been prescribed Aricept, but he is waiting on it through mail order. Otherwise, he denies chest pain, palpitations, dyspnea, PND, orthopnea, nausea, vomiting, dizziness, syncope, edema, weight gain, or early satiety. He states he   Device History: [employer], minerals ICD implanted 3/47/4259 for systolic CHF History of appropriate therapy: No History of AAD therapy: Yes, on amiodarone  Past Medical History:  Diagnosis Date   AICD (automatic cardioverter/defibrillator) present 10/18/2016   "replaced 2009-St. Jude device, Dr Lovena Le"   Anemia    Arthritis    "back" (08/27/2017)   CAD (coronary artery disease)    CHF (congestive heart failure) (HCC)    CKD (chronic kidney disease) stage 3, GFR 30-59 ml/min (HCC)    [name]   Diverticulosis    Dyslipidemia    GERD (gastroesophageal reflux disease)    Hypertension    Neuromuscular disorder (HCC)    neuropathy- in hands   Osteoarthritis    hips, hands   Pacemaker    Shortness of breath    Sleep apnea    started a sleep study but didn't completed, states PCP told him he has sleep  apnea, doesn't use CPAP   Systolic dysfunction    Type 2 diabetes with nephropathy (HCC)    Vitamin D deficiency    Past Surgical History:  Procedure Laterality Date   ANTERIOR CERVICAL  DECOMP/DISCECTOMY FUSION     BACK SURGERY     CARPAL TUNNEL RELEASE Left    CORONARY ARTERY BYPASS GRAFT N/A 09/02/2017   Procedure: CORONARY ARTERY BYPASS GRAFTING (CABG) x three , using left internal mammary artery and right leg greater saphenous vein harvested endoscopically;  Surgeon: [name], MD;  Location: [facility];  Service: Open Heart Surgery;  Laterality: N/A;   CYST EXCISION     "back"   ICD IMPLANT N/A 10/18/2016   Procedure: ICD Implant- Upgrade to Dual Chamber;  Surgeon: Evans Lance, MD;  Location: [facility];  Service: Cardiovascular;  Laterality: N/A;   INSERT / REPLACE / REMOVE PACEMAKER     JOINT REPLACEMENT     LEFT HEART CATH AND CORONARY ANGIOGRAPHY N/A 08/30/2017   Procedure: LEFT HEART CATH AND CORONARY ANGIOGRAPHY;  Surgeon: Martinique, Peter M, MD;  Location: [facility];  Service: Cardiovascular;  Laterality: N/A;   LUMBAR LAMINECTOMY Left 07/17/2013   Procedure: MICRODISCECTOMY LUMBAR LAMINECTOMY;  Surgeon: Marybelle Killings, MD;  Location: [facility];  Service: Orthopedics;  Laterality: Left;  Left L4 Laminotomy, Lateral Recess Decompression, Cyst Excision   SHOULDER OPEN ROTATOR CUFF REPAIR Left    TEE WITHOUT CARDIOVERSION N/A 09/02/2017   Procedure: TRANSESOPHAGEAL ECHOCARDIOGRAM (TEE);  Surgeon: [name], MD;  Location: [facility];  Service: Open Heart Surgery;  Laterality: N/A;   TOTAL HIP ARTHROPLASTY Left 09/17/2012   Procedure:  Left TOTAL HIP ARTHROPLASTY ANTERIOR APPROACH;  Surgeon: Marybelle Killings, MD;  Location: [facility];  Service: Orthopedics;  Laterality: Left;    Current Outpatient Medications  Medication Sig Dispense Refill   Accu-Chek Softclix Lancets lancets USE AS DIRECTED  TO TEST BLOOD SUGAR ONE TIME DAILY  AND AS NEEDED 200 each 2   Alcohol Swabs (B-D SINGLE USE SWABS REGULAR) PADS Use as instructed to clean area for glucose monitoring once daily and as needed.  Diagnosis:  E11.21  Non insulin-dependent 100 each 3   allopurinol (ZYLOPRIM) 100 MG  tablet Take 0.5 tablets (50 mg total) by mouth daily. 15 tablet 2   amiodarone (PACERONE) 200 MG tablet Take 1 tablet (200 mg total) by mouth daily. 90 tablet 3   apixaban (ELIQUIS) 5 MG TABS tablet Take 1 tablet (5 mg total) by mouth 2 (two) times daily. 60 tablet 5   atorvastatin (LIPITOR) 80 MG tablet TAKE 1 TABLET (80 MG TOTAL) DAILY AT 6 PM. 90 tablet 3   Blood Glucose Calibration (ACCU-CHEK AVIVA) SOLN Use to calibrate blood glucose machine as recommended.  Diagnosis:  E11.21  Non-insulin dependent. 1 each 2   Blood Glucose Monitoring Suppl (ACCU-CHEK GUIDE) w/Device KIT Use to check blood sugar once daily. 1 kit 0   carvedilol (COREG) 3.125 MG tablet Take 1 tablet (3.125 mg total) by mouth 2 (two) times daily. 60 tablet 3   donepezil (ARICEPT) 5 MG tablet TAKE ONE TABLET BY MOUTH EVERY EVENING 90 tablet 1   ezetimibe (ZETIA) 10 MG tablet TAKE ONE TABLET BY MOUTH EVERY MORNING 90 tablet 2   finasteride (PROSCAR) 5 MG tablet Take 1 tablet (5 mg total) by mouth daily. 30 tablet 5   furosemide (LASIX) 20 MG tablet TAKE ONE TABLET BY MOUTH EVERY MORNING 30 tablet 2   gabapentin (NEURONTIN) 300 MG capsule TAKE TWO CAPSULES BY MOUTH TWICE DAILY 120 capsule 2   glipiZIDE (GLUCOTROL XL) 10 MG 24 hr tablet Take 1 tablet (10 mg total) by mouth daily with breakfast. 90 tablet 0   glucose blood (ACCU-CHEK AVIVA PLUS) test strip Test Blood Sugar 1 time daily: Dx:E11.27 100 each 3   sacubitril-valsartan (ENTRESTO) 24-26 MG TAKE ONE TABLET BY MOUTH EVERY MORNING and TAKE ONE TABLET BY MOUTH EVERY EVENING 60 tablet 0   tamsulosin (FLOMAX) 0.4 MG CAPS capsule Take 1 capsule (0.4 mg total) by mouth daily. 30 capsule 5   No current facility-administered medications for this visit.    Allergies:   Patient has no known allergies.   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   Desires CPR, does want life support if not futile. 05/30/2020- discussed, patient would want CPR/ intubation. Does not have HCPOA/ living will      Social Determinants of Health   Financial Resource Strain: Low Risk    Difficulty of Paying Living Expenses: Not very hard  Food Insecurity: No Food Insecurity   Worried About Charity fundraiser in the Last Year: Never true   Ran Out of Food in the Last Year: Never true  Transportation Needs: No Transportation Needs   Lack of Transportation (Medical): No   Lack of Transportation (Non-Medical): No  Physical Activity: Not on file  Stress: Not on file  Social Connections: Not on file  Intimate Partner Violence: Not on file    Family History: Family History  Problem Relation Age of Onset   Heart attack Father 32   Diabetes Brother     Review of Systems: Review of systems complete and found to be negative unless listed in HPI.     Physical Exam: There were no vitals filed for this visit.   GEN- The patient is well appearing, alert and oriented x 3 today.   HEENT: normocephalic, atraumatic; sclera clear, conjunctiva pink; hearing intact; oropharynx clear; neck supple, no JVP Lymph- no cervical lymphadenopathy Lungs- Clear to ausculation bilaterally, normal work of breathing.  No wheezes,  rales, rhonchi Heart- Regular rate and rhythm, no murmurs, rubs or gallops, PMI not laterally displaced GI- soft, non-tender, non-distended, bowel sounds present, no hepatosplenomegaly Extremities- no clubbing or cyanosis. No edema; DP/PT/radial pulses 2+ bilaterally MS- no significant deformity or atrophy Skin- warm and dry, no rash or lesion; ICD pocket well healed Psych- euthymic mood, full affect Neuro- strength and sensation are intact  ICD interrogation- reviewed in detail today,  See PACEART report  EKG:  EKG is ordered today. Personal review of EKG ordered today shows ***   Recent Labs: 03/10/2021: TSH 1.45 06/29/2021: ALT 7; BUN 18; Creatinine, Ser 1.66; Hemoglobin 11.4; Platelets 204.0; Potassium 3.8; Sodium 137   Wt Readings from Last 3 Encounters:  07/06/21 189 lb (85.7 kg)  06/29/21 184 lb 2 oz (83.5 kg)  03/10/21 182 lb 8 oz (82.8 kg)     Other studies Reviewed: Additional studies/ records that were reviewed today include: Previous EP office notes.   Assessment and Plan:  1.  Chronic systolic dysfunction s/p St. Jude dual chamber ICD  Volume status *** Stable on an appropriate medical regimen Normal ICD function See Pace Art report  2. NSVT/VT Labs 06/2021 reviewed and stable.  He had VT with appropriate therapy on 10/15/2021, likely in setting of medical non-compliance His son states he misses multiple doses of medications several times a week, typically his morning doses.  Will add back amiodarone 200 mg daily so it can be in his system even on days where he may miss a dose.  Increase coreg at this point likely low utility, but consider with further.  Reviewed [place] driving restrictions.   3. PAF Maintaining NSR with overall low burden of AF Continue eliquis  4. Dementia Stable on aricept.   Current medicines are reviewed at length with the patient today.     Disposition:   Follow up with EP APP in ***   Signed, Shirley Friar, PA-C   11/15/2021 12:13 PM  [facility] [address] [place] [address] [phone] (office) [phone] (fax)

## 2021-11-17 NOTE — [facility] Notes (Signed)
No ICM remote transmission received for 11/13/2021 and next ICM transmission scheduled for 11/20/2021.   ?

## 2021-11-20 ENCOUNTER — Ambulatory Visit: Payer: Medicare HMO

## 2021-11-20 DIAGNOSIS — Z95810 Presence of automatic (implantable) cardiac defibrillator: Secondary | ICD-10-CM

## 2021-11-20 DIAGNOSIS — I5022 Chronic systolic (congestive) heart failure: Secondary | ICD-10-CM

## 2021-11-22 ENCOUNTER — Telehealth: Payer: Self-pay

## 2021-11-22 ENCOUNTER — Other Ambulatory Visit: Payer: Self-pay | Admitting: Nurse Practitioner

## 2021-11-22 ENCOUNTER — Encounter: Payer: Medicare HMO | Admitting: Student

## 2021-11-22 DIAGNOSIS — I5022 Chronic systolic (congestive) heart failure: Secondary | ICD-10-CM

## 2021-11-22 DIAGNOSIS — E1121 Type 2 diabetes mellitus with diabetic nephropathy: Secondary | ICD-10-CM

## 2021-11-22 DIAGNOSIS — I4729 Other ventricular tachycardia: Secondary | ICD-10-CM

## 2021-11-22 DIAGNOSIS — I472 Ventricular tachycardia, unspecified: Secondary | ICD-10-CM

## 2021-11-22 DIAGNOSIS — I10 Essential (primary) hypertension: Secondary | ICD-10-CM

## 2021-11-22 NOTE — Chronic Care Management (AMB) (Signed)
? ? ?Chronic Care Management ?Pharmacy Assistant  ? ?Name: Randy [name]  MRN: [id number] DOB: 10/03/1945 ? ?Reason for Encounter: Medication Adherence and Delivery Coordination  ? ? ?Recent office visits:  ?11/13/21-Randy [name], LCSC-Follow up telephone call-spoke with son, no medication changes  ? ?Recent consult visits:  ?None since last CCM contact ? ?Hospital visits:  ?None in previous 6 months ? ?Medications: ?Outpatient Encounter Medications as of 11/22/2021  ?Medication Sig  ? Accu-Chek Softclix Lancets lancets USE AS DIRECTED  TO TEST BLOOD SUGAR ONE TIME DAILY  AND AS NEEDED  ? Alcohol Swabs (B-D SINGLE USE SWABS REGULAR) PADS Use as instructed to clean area for glucose monitoring once daily and as needed.  Diagnosis:  E11.21  Non insulin-dependent  ? allopurinol (ZYLOPRIM) 100 MG tablet Take 0.5 tablets (50 mg total) by mouth daily.  ? amiodarone (PACERONE) 200 MG tablet Take 1 tablet (200 mg total) by mouth daily.  ? apixaban (ELIQUIS) 5 MG TABS tablet Take 1 tablet (5 mg total) by mouth 2 (two) times daily.  ? atorvastatin (LIPITOR) 80 MG tablet TAKE 1 TABLET (80 MG TOTAL) DAILY AT 6 PM.  ? Blood Glucose Calibration (ACCU-CHEK AVIVA) SOLN Use to calibrate blood glucose machine as recommended.  Diagnosis:  E11.21  Non-insulin dependent.  ? Blood Glucose Monitoring Suppl (ACCU-CHEK GUIDE) w/Device KIT Use to check blood sugar once daily.  ? carvedilol (COREG) 3.125 MG tablet Take 1 tablet (3.125 mg total) by mouth 2 (two) times daily.  ? donepezil (ARICEPT) 5 MG tablet TAKE ONE TABLET BY MOUTH EVERY EVENING  ? ezetimibe (ZETIA) 10 MG tablet TAKE ONE TABLET BY MOUTH EVERY MORNING  ? finasteride (PROSCAR) 5 MG tablet Take 1 tablet (5 mg total) by mouth daily.  ? furosemide (LASIX) 20 MG tablet TAKE ONE TABLET BY MOUTH EVERY MORNING  ? gabapentin (NEURONTIN) 300 MG capsule TAKE TWO CAPSULES BY MOUTH TWICE DAILY  ? glipiZIDE (GLUCOTROL XL) 10 MG 24 hr tablet Take 1 tablet (10 mg total) by mouth daily with  breakfast.  ? glucose blood (ACCU-CHEK AVIVA PLUS) test strip Test Blood Sugar 1 time daily: Dx:E11.27  ? sacubitril-valsartan (ENTRESTO) 24-26 MG TAKE ONE TABLET BY MOUTH EVERY MORNING and TAKE ONE TABLET BY MOUTH EVERY EVENING  ? tamsulosin (FLOMAX) 0.4 MG CAPS capsule Take 1 capsule (0.4 mg total) by mouth daily.  ? ?No facility-administered encounter medications on file as of 11/22/2021.  ? ?BP Readings from Last 3 Encounters:  ?07/06/21 (!) 110/50  ?06/29/21 90/62  ?03/10/21 (!) 108/56  ?  ?Lab Results  ?Component Value Date  ? HGBA1C 7.9 (A) 06/29/2021  ?  ? ? ?No OVs, Consults, or hospital visits since last care coordination call / Pharmacist visit. ?No medication changes indicated ? ? ?Last adherence delivery date:11/03/21     ? ?Patient is due for next adherence delivery on: 12/05/21 ?Spoke with patient on 11/22/21 reviewed medications and coordinated delivery. Spoke with the son  ? ?This delivery to include: Adherence Packaging  30 Days  ?Packs: ?Carvedilol 3.125 mg - 1 BID (breakfast and evening meal) ?Gabapentin 300 mg - 2 BID (breakfast and evening meal) ?Furosemide 20 mg - 1 daily (breakfast) ?Allopurinol 100 mg - 1/2 tablet daily (breakfast) ?Tamsulosin 0.48m - 1 daily (evening meal) ?Finasteride 5 mg - 1 daily (breakfast) ?Atorvastatin 80 mg - 1 tablet (evening meal) ?Glipizide ER 10 mg - 1 daily  (breakfast) ?Zetia 10 mg- 1 tablet daily (breakfast)  ?Donepezil 559m-1 tablet (evening meal) ?Amiodarone  28m - 1 tablet (breakfast) ?Eliquis 576m- take 1 tablet breakfast 1 tablet evening meal  ? ?VIAL medications: ?Entresto 24-2658make 1 tablet am 1 tablet pm -Patient has supply on hand from mail order  ? ? ?Patient declined the following medications this month: ?none ? ? ?Any concerns about your medications? Yes  Son is inquiring about any alarm system to remind the patient that it is time to take medicine. ? ?How often do you forget or accidentally miss a dose? Rarely ? ?Do you use a pillbox? No ? ?Is  patient in packaging Yes -  Patients son not home to read package date  ? ? ?Refills requested from providers include:Glipizide ? ?Confirmed delivery date of 12/05/21, advised patient that pharmacy will contact them the morning of delivery. ? ?Recent blood pressure readings are as follows: none available  ? ? ?Recent blood glucose readings are as follows: none available ? ? ? ?Annual wellness visit in last year? No ?Most Recent BP reading:110/50  60-P  07/06/21 ? ?If Diabetic: ?Most recent A1C reading:7.9  06/29/21 ?Last eye exam / retinopathy screening:2020 ?Last diabetic foot exam: never done ? ?LinCharlene BrookePP notified ? ?Randy [name], CCMA ?Health concierge  ?[phone]

## 2021-11-23 ENCOUNTER — Telehealth: Payer: Self-pay

## 2021-11-23 NOTE — Telephone Encounter (Signed)
Remote ICM transmission received.  Attempted call to patient regarding ICM remote transmission and left detailed message per DPR.  Advised to return call for any fluid symptoms or questions. Next ICM remote transmission scheduled 12/26/2021.   ? ?

## 2021-11-23 NOTE — Progress Notes (Signed)
EPIC Encounter for ICM Monitoring ? ?Patient Name: Randy [name] is a 76 y.o. male ?Date: 11/23/2021 ?Primary Care Physican: Michela Pitcher, NP ?Primary Cardiologist: Lovena Le ?Electrophysiologist: Lovena Le ?07/05/2021 Weight: 189 lbs ?  ?AT/AF Burden: 8.0%  ?  ?  ?Attempted call to patient and unable to reach.  Left detailed message per DPR regarding transmission. Transmission reviewed.  ?  ?Corvue thoracic impedance suggesting normal fluid levels.   ?  ?Prescribed: Furosemide 20 mg 1 tablet daily ?  ?Labs: ?06/29/2021 Creatinine 1.66, BUN , Potassium 3.8, Sodium 137, GFR 40.04 ?03/10/2021 Creatinine 1.86, BUN 32, Potassium 3.9, Sodium 138, GFR 35 ?A complete set of results can be found in Results Review. ?  ?Recommendations: Left voice mail with ICM number and encouraged to call if experiencing any fluid symptoms. ?  ?Follow-up plan: ICM clinic phone appointment on 12/26/2021.  91 day device clinic remote transmission scheduled for 01/02/2022.  ?  ?EP/Cardiology Office Visits:  Missed appointment 09/13/2021 and 11/22/2021 with Oda Kilts, PA.   ?  ?Copy of ICM check sent to Dr. Lovena Le.   ? ?3 month ICM trend: 11/19/2021. ? ? ? ?12-14 Month ICM trend:  ? ? ? ?Rosalene Billings, RN ?11/23/2021 ?3:23 PM ? ?

## 2021-11-23 NOTE — Telephone Encounter (Signed)
Spoke with patient and scheduled for follow up on 11/28/21, I called patient's son Randy [name] and left a message letting him know about this appt also. ?

## 2021-11-27 ENCOUNTER — Telehealth: Payer: Medicare HMO

## 2021-11-28 ENCOUNTER — Ambulatory Visit (INDEPENDENT_AMBULATORY_CARE_PROVIDER_SITE_OTHER)
Admission: RE | Admit: 2021-11-28 | Discharge: 2021-11-28 | Disposition: A | Discharge status: Home / Self Care | Payer: Medicare HMO | Source: Ambulatory Visit | Attending: Nurse Practitioner | Admitting: Nurse Practitioner

## 2021-11-28 ENCOUNTER — Ambulatory Visit (INDEPENDENT_AMBULATORY_CARE_PROVIDER_SITE_OTHER): Payer: Medicare HMO | Admitting: Nurse Practitioner

## 2021-11-28 ENCOUNTER — Telehealth: Payer: Self-pay | Admitting: Nurse Practitioner

## 2021-11-28 ENCOUNTER — Other Ambulatory Visit: Payer: Self-pay | Admitting: Nurse Practitioner

## 2021-11-28 ENCOUNTER — Encounter: Payer: Self-pay | Admitting: Nurse Practitioner

## 2021-11-28 VITALS — BP 80/50 | HR 77 | Temp 97.5°F | Resp 16 | Ht 68.0 in | Wt 184.0 lb

## 2021-11-28 DIAGNOSIS — S61209A Unspecified open wound of unspecified finger without damage to nail, initial encounter: Secondary | ICD-10-CM

## 2021-11-28 DIAGNOSIS — S61200A Unspecified open wound of right index finger without damage to nail, initial encounter: Secondary | ICD-10-CM | POA: Diagnosis not present

## 2021-11-28 DIAGNOSIS — I10 Essential (primary) hypertension: Secondary | ICD-10-CM | POA: Diagnosis not present

## 2021-11-28 DIAGNOSIS — I952 Hypotension due to drugs: Secondary | ICD-10-CM | POA: Diagnosis not present

## 2021-11-28 DIAGNOSIS — E782 Mixed hyperlipidemia: Secondary | ICD-10-CM

## 2021-11-28 DIAGNOSIS — E1121 Type 2 diabetes mellitus with diabetic nephropathy: Secondary | ICD-10-CM | POA: Diagnosis not present

## 2021-11-28 DIAGNOSIS — E559 Vitamin D deficiency, unspecified: Secondary | ICD-10-CM | POA: Diagnosis not present

## 2021-11-28 DIAGNOSIS — G3184 Mild cognitive impairment, so stated: Secondary | ICD-10-CM

## 2021-11-28 DIAGNOSIS — M869 Osteomyelitis, unspecified: Secondary | ICD-10-CM

## 2021-11-28 LAB — LIPID PANEL
Cholesterol: 266 mg/dL — ABNORMAL HIGH (ref 0–200)
HDL: 33.6 mg/dL — ABNORMAL LOW (ref >39.00)
Total CHOL/HDL Ratio: 8
Triglycerides: 714 mg/dL — ABNORMAL HIGH (ref 0.0–149.0)

## 2021-11-28 LAB — CBC
HCT: 37.2 % — ABNORMAL LOW (ref 39.0–52.0)
Hemoglobin: 12.5 g/dL — ABNORMAL LOW (ref 13.0–17.0)
MCHC: 33.7 g/dL (ref 30.0–36.0)
MCV: 85 fl (ref 78.0–100.0)
Platelets: 205 10*3/uL (ref 150.0–400.0)
RBC: 4.38 Mil/uL (ref 4.22–5.81)
RDW: 13.9 % (ref 11.5–15.5)
WBC: 9.1 10*3/uL (ref 4.0–10.5)

## 2021-11-28 LAB — COMPREHENSIVE METABOLIC PANEL
ALT: 9 U/L (ref 0–53)
AST: 14 U/L (ref 0–37)
Albumin: 4.3 g/dL (ref 3.5–5.2)
Alkaline Phosphatase: 80 U/L (ref 39–117)
BUN: 32 mg/dL — ABNORMAL HIGH (ref 6–23)
CO2: 26 mEq/L (ref 19–32)
Calcium: 9 mg/dL (ref 8.4–10.5)
Chloride: 98 mEq/L (ref 96–112)
Creatinine, Ser: 2.39 mg/dL — ABNORMAL HIGH (ref 0.40–1.50)
GFR: 25.78 mL/min — ABNORMAL LOW (ref >60.00)
Glucose, Bld: 227 mg/dL — ABNORMAL HIGH (ref 70–99)
Potassium: 3.8 mEq/L (ref 3.5–5.1)
Sodium: 134 mEq/L — ABNORMAL LOW (ref 135–145)
Total Bilirubin: 0.4 mg/dL (ref 0.2–1.2)
Total Protein: 7.2 g/dL (ref 6.0–8.3)

## 2021-11-28 LAB — POCT GLYCOSYLATED HEMOGLOBIN (HGB A1C): Hemoglobin A1C: 10 % — AB (ref 4.0–5.6)

## 2021-11-28 LAB — VITAMIN D 25 HYDROXY (VIT D DEFICIENCY, FRACTURES): VITD: 19.27 ng/mL — ABNORMAL LOW (ref 30.00–100.00)

## 2021-11-28 LAB — LDL CHOLESTEROL, DIRECT: Direct LDL: 123 mg/dL

## 2021-11-28 MED ORDER — DOXYCYCLINE HYCLATE 100 MG PO TABS: 100.0000 mg | ORAL_TABLET | Freq: Two times a day (BID) | ORAL | 0 refills | Status: AC

## 2021-11-28 NOTE — Telephone Encounter (Signed)
When patient has time to get home we need to verify the medications that he has and taking, along with the frequency. Can we find out if he has a blood pressure cuff at home. If so he needs to check his blood pressure this afternoon. If the top number is above 100 he can take his medications. If it is below he can hold 1 dose of the carvedilol and one dose of the entresto.  ? ?We need to contact his son and schedule an in office visit or virtual visit with both of him to talk about next steps in patients care plan ?

## 2021-11-28 NOTE — Telephone Encounter (Signed)
Spoke with Laverna Peace, patient's son, advised him of Xray results, referral to hand specialist and the visit today. Scheduled visit as requested for 12/01/21. ?Laverna Peace states that talking to the patient will not be helpful about medication that he forgets what was said to him in like 5 minutes after. [name] states [facility] sends patient a pill box with the medications and Laverna Peace tries to remind patient to take his medications but due to work schedule not able to call him 3 times daily. Laverna Peace states he would be shock if patient takes his medication even 50% of the time. Madelin Rear that someone would call him about referral to hand specialist and that this is very important. Doxy was sent in to [facility] and they are not able to deliver it today since RX was sent in after 2 pm I spoke with Laverna Peace and [facility] is transferring this over to [facility] on [address] in [place] so [place] can pick this up today.  ?I called patient he did not have b/p cuff at home, patient could not find his medication box and is not sure what the medications are in there. Patient states he has not taking his medications today. He will not be able to know which medication is carvedilol or entresto in order to not take it due to its prefilled. ?

## 2021-11-28 NOTE — Progress Notes (Signed)
Orders only

## 2021-11-28 NOTE — Patient Instructions (Addendum)
I want you to check it once a day at home twice if you need it ?I want you to verify what medications you are taking. Get your son to help look also ?We will call and find out what you have been taking and how often ?I want to see you in 3 months. BRING ALL your medications with you each visit ? ?Your blood pressure is low in office you can skip the dose of carvedilol this evening and entresto if you blood pressure is still low this evening ? ?

## 2021-11-28 NOTE — Progress Notes (Signed)
? ?Established Patient Office Visit ? ?Subjective   ?Patient ID: Randy [name], male    DOB: [birth year]  Age: 76 y.o. MRN: [id number] ? ?Chief Complaint  ?Patient presents with  ? Diabetes  ?  Follow up  ? Memory Loss  ?  Follow up  ? ? ?Diabetes ?Pertinent negatives for hypoglycemia include no headaches. Pertinent negatives for diabetes include no chest pain.  ? ?DM2: has not been checking sugar at home. Does not check glucose at home. Does not have supplies. Will check glucose once a day ? ?Memory: unsure if he is taking the aricept or not. Short term memory ? ?HTN: states that he has not been checking BP at home. Does not know if he has a cuff ? ? ? ? ? ?Review of Systems  ?Constitutional:  Negative for chills, fever and malaise/fatigue.  ?Respiratory:  Positive for shortness of breath (Baseline DOE.).   ?Cardiovascular:  Negative for chest pain.  ?Gastrointestinal:  Negative for diarrhea, nausea and vomiting.  ?Musculoskeletal:  Negative for joint pain.  ?Neurological:  Negative for headaches.  ?Psychiatric/Behavioral:  Positive for memory loss.   ? ?  ?Objective:  ?  ? ?BP (!) 80/50   Pulse 77   Temp (!) 97.5 ?F (36.4 ?C)   Resp 16   Ht '5\' 8"'$  (1.727 m)   Wt 184 lb (83.5 kg)   SpO2 96%   BMI 27.98 kg/m?  ? ? ?Physical Exam ?Vitals and nursing note reviewed.  ?Constitutional:   ?   Appearance: Normal appearance.  ?Cardiovascular:  ?   Rate and Rhythm: Normal rate and regular rhythm.  ?   Pulses:     ?     Dorsalis pedis pulses are 2+ on the right side and 2+ on the left side.  ?     Posterior tibial pulses are 1+ on the right side and 1+ on the left side.  ?   Heart sounds: Normal heart sounds.  ?Pulmonary:  ?   Effort: Pulmonary effort is normal.  ?   Breath sounds: Normal breath sounds.  ?Abdominal:  ?   General: Bowel sounds are normal.  ?Musculoskeletal:     ?   General: Signs of injury present.  ?   Right lower leg: No edema.  ?   Left lower leg: No edema.  ?     Feet: ? ?   Comments: See clinical  photo. Missing distal end of the right index finger.   ?Feet:  ?   Right foot:  ?   Skin integrity: Dry skin present.  ?   Toenail Condition: Right toenails are abnormally thick.  ?   Left foot:  ?   Skin integrity: Dry skin present.  ?   Toenail Condition: Left toenails are abnormally thick.  ?Skin: ?   General: Skin is warm.  ?   Findings: Lesion present.  ?Neurological:  ?   General: No focal deficit present.  ?   Mental Status: He is alert.  ?Psychiatric:     ?   Mood and Affect: Mood normal.     ?   Behavior: Behavior normal.     ?   Thought Content: Thought content normal.     ?   Judgment: Judgment normal.  ? ? ? ? ?Results for orders placed or performed in visit on 11/28/21  ?POCT glycosylated hemoglobin (Hb A1C)  ?Result Value Ref Range  ? Hemoglobin A1C 10.0 (A) 4.0 - 5.6 %  ?  HbA1c POC (<> result, manual entry)    ? HbA1c, POC (prediabetic range)    ? HbA1c, POC (controlled diabetic range)    ? ? ? ? ?The ASCVD Risk score (Arnett DK, et al., 2019) failed to calculate for the following reasons: ?  The patient has a prior MI or stroke diagnosis ? ?  ?Assessment & Plan:  ? ?Problem List Items Addressed This Visit   ? ?  ? Cardiovascular and Mediastinum  ? Essential hypertension  ? Relevant Orders  ? CBC  ? Comprehensive metabolic panel  ? Hypotension due to drugs  ?  Patient borderline hypotensive in office upon recheck more so hypertensive.  Patient is not having any symptoms.  Did asked patient to check blood pressure at home but is unsure if he has blood pressure cuff at home.  We will have staff follow-up with patient to verify whether he has a cuff or not if no cuff he can hold 1 dose of carvedilol and 1 dose of Entresto this evening and continue taking medication as prescribed thereafter.  He does have blood pressure cuff as long systolic is above 263 he can take medication as prescribed. ? ?  ?  ?  ? Endocrine  ? Type 2 diabetes with nephropathy (HCC) - Primary  ?  Patient was currently maintained on  glipizide 10 mg XL.  Patient does not check glucose at home.  States he does not have supplies will send in.  Patient states he is taking medication but is unsure as he lives alone.  Patient's A1c jumped quite a bit for patient to be adherent to medications.  We will follow-up with patient and patient's son to make sure patient is here to medication before making any changes did discuss this with patient office today. ? ?  ?  ? Relevant Orders  ? POCT glycosylated hemoglobin (Hb A1C) (Completed)  ? Microalbumin/Creatinine Ratio, Urine  ? CBC  ? Comprehensive metabolic panel  ? Lipid panel  ?  ? Nervous and Auditory  ? Mild cognitive impairment  ?  Patient is complaining about short-term memory loss.  We did have patient placed on donepezil.  Patient is unsure if he is taking the medication patient unsure if he has medication at home.  Patient did not bring medications to office visit.  Patient is alone in office visit.  Like to make a follow-up phone call to verify patient's medication list and what he actually has at home and to see his adherence. ? ?  ?  ?  ? Other  ? Vitamin D deficiency  ? Relevant Orders  ? VITAMIN D 25 Hydroxy (Vit-D Deficiency, Fractures)  ? HLD (hyperlipidemia)  ?  Lipid panel today. ? ?  ?  ? Open wnd of finger  ?  Wound to right distal index finger.  Almost looks like amputation by nature.  Nonpainful not red.  Does have eschar appearance to the distal end of finger.  We will do x-ray in office to rule out osteomyelitis also urgent referral to hand for further evaluation likely will cover patient with doxycycline antibiotics as patient is poor historian ? ?  ?  ? Relevant Orders  ? Ambulatory referral to Hand Surgery  ? DG Finger Index Right (Completed)  ? ? ?Return in about 3 months (around 02/28/2022) for Recheck .  ? ? ?Romilda Garret, NP ? ?

## 2021-11-28 NOTE — Assessment & Plan Note (Signed)
Patient borderline hypotensive in office upon recheck more so hypertensive.  Patient is not having any symptoms.  Did asked patient to check blood pressure at home but is unsure if he has blood pressure cuff at home.  We will have staff follow-up with patient to verify whether he has a cuff or not if no cuff he can hold 1 dose of carvedilol and 1 dose of Entresto this evening and continue taking medication as prescribed thereafter.  He does have blood pressure cuff as long systolic is above 728 he can take medication as prescribed. ?

## 2021-11-28 NOTE — Addendum Note (Signed)
Addended by: Ellamae Sia on: 11/28/2021 02:19 PM ? ? Modules accepted: Orders ? ?

## 2021-11-28 NOTE — Assessment & Plan Note (Signed)
Lipid panel today

## 2021-11-28 NOTE — Assessment & Plan Note (Signed)
Patient was currently maintained on glipizide 10 mg XL.  Patient does not check glucose at home.  States he does not have supplies will send in.  Patient states he is taking medication but is unsure as he lives alone.  Patient's A1c jumped quite a bit for patient to be adherent to medications.  We will follow-up with patient and patient's son to make sure patient is here to medication before making any changes did discuss this with patient office today. ?

## 2021-11-28 NOTE — Assessment & Plan Note (Signed)
Wound to right distal index finger.  Almost looks like amputation by nature.  Nonpainful not red.  Does have eschar appearance to the distal end of finger.  We will do x-ray in office to rule out osteomyelitis also urgent referral to hand for further evaluation likely will cover patient with doxycycline antibiotics as patient is poor historian ?

## 2021-11-28 NOTE — Assessment & Plan Note (Signed)
Patient is complaining about short-term memory loss.  We did have patient placed on donepezil.  Patient is unsure if he is taking the medication patient unsure if he has medication at home.  Patient did not bring medications to office visit.  Patient is alone in office visit.  Like to make a follow-up phone call to verify patient's medication list and what he actually has at home and to see his adherence. ?

## 2021-11-29 DIAGNOSIS — M868X4 Other osteomyelitis, hand: Secondary | ICD-10-CM | POA: Diagnosis not present

## 2021-12-01 ENCOUNTER — Ambulatory Visit (INDEPENDENT_AMBULATORY_CARE_PROVIDER_SITE_OTHER): Payer: Medicare HMO | Admitting: Nurse Practitioner

## 2021-12-01 ENCOUNTER — Encounter: Payer: Self-pay | Admitting: Nurse Practitioner

## 2021-12-01 VITALS — BP 108/56 | HR 59 | Temp 96.6°F | Resp 14 | Ht 68.0 in | Wt 189.1 lb

## 2021-12-01 DIAGNOSIS — G3184 Mild cognitive impairment, so stated: Secondary | ICD-10-CM

## 2021-12-01 DIAGNOSIS — E1121 Type 2 diabetes mellitus with diabetic nephropathy: Secondary | ICD-10-CM | POA: Diagnosis not present

## 2021-12-01 DIAGNOSIS — N289 Disorder of kidney and ureter, unspecified: Secondary | ICD-10-CM | POA: Diagnosis not present

## 2021-12-01 DIAGNOSIS — E782 Mixed hyperlipidemia: Secondary | ICD-10-CM

## 2021-12-01 DIAGNOSIS — I10 Essential (primary) hypertension: Secondary | ICD-10-CM | POA: Diagnosis not present

## 2021-12-01 DIAGNOSIS — S61209A Unspecified open wound of unspecified finger without damage to nail, initial encounter: Secondary | ICD-10-CM

## 2021-12-01 LAB — BASIC METABOLIC PANEL
BUN: 37 mg/dL — ABNORMAL HIGH (ref 6–23)
CO2: 25 mEq/L (ref 19–32)
Calcium: 8.8 mg/dL (ref 8.4–10.5)
Chloride: 102 mEq/L (ref 96–112)
Creatinine, Ser: 2.02 mg/dL — ABNORMAL HIGH (ref 0.40–1.50)
GFR: 31.54 mL/min — ABNORMAL LOW (ref >60.00)
Glucose, Bld: 274 mg/dL — ABNORMAL HIGH (ref 70–99)
Potassium: 5.3 mEq/L — ABNORMAL HIGH (ref 3.5–5.1)
Sodium: 135 mEq/L (ref 135–145)

## 2021-12-01 NOTE — Assessment & Plan Note (Signed)
Blood pressure normotensive in office today.  Continue medication as prescribed ?

## 2021-12-01 NOTE — Assessment & Plan Note (Signed)
Has been on donepezil and sure patient is taking medication as prescribed.  Great concern for me for patient's safety living at home son feels very similarly has been looking into assisted living's.  We will see if we can help with resources to find assisted living's that will accept patient's insurance ?

## 2021-12-01 NOTE — Progress Notes (Signed)
? ?Established Patient Office Visit ? ?Subjective   ?Patient ID: Randy [name], male    DOB: August 24, 1945  Age: 76 y.o. MRN: [id number] ? ?Chief Complaint  ?Patient presents with  ? Follow-up  ?  Discuss patient care  ? ? ?HPI ? ?Patient presents with his son in clinic due to my request.  Patient was recently seen on 11/28/2021.  For checkup on conditions inclusive of diabetes and mild cognitive impairment.  Patient is a poor historian and does not seem to be taking medications per his son's report as prescribed.  Patient also had a new injury to his index finger that was urgently referred to hand and then referred to ID for suspected osteomyelitis. ?Since previous office visit patient had a derangement and labs inclusive of a three-point increase in hemoglobin A1c triglycerides increased 500 points and creatinine increased over 1 point along with GFR dropping approximately 20 points. ?Had discussion with patient and son about the importance of taking medications and the metabolic derangement that is happening secondary to nonadherence to medication therapy.  Patient does live alone and son does work full-time.  We did have conversation in regards to think patient was going to require higher level of care.  Do not think in-home health aides will be beneficial in regards to helping patient maintain a healthy and safe lifestyle.  My recommendation is but patient be placed in assisted living facility where he can be monitored and adherence to medications can be ensured as patient is on several medications that if he takes too many can cause a precipitous death. ? ?I printed off labs and discussed them with patient and patient's son at bedside however really important changes and giving a copy to patient's son ? ? ? ?Review of Systems  ?Constitutional:  Negative for chills and fever.  ?Respiratory:  Negative for shortness of breath.   ?Cardiovascular:  Negative for chest pain.  ?Psychiatric/Behavioral:  Positive for memory  loss.   ? ?  ?Objective:  ?  ? ?BP (!) 108/56   Pulse (!) 59   Temp (!) 96.6 ?F (35.9 ?C) (Temporal)   Resp 14   Ht '5\' 8"'$  (1.727 m)   Wt 189 lb 2 oz (85.8 kg)   SpO2 99%   BMI 28.76 kg/m?  ? ? ?Physical Exam ?Vitals and nursing note reviewed.  ?Constitutional:   ?   Appearance: Normal appearance.  ?Cardiovascular:  ?   Rate and Rhythm: Normal rate and regular rhythm.  ?   Heart sounds: Normal heart sounds.  ?Pulmonary:  ?   Effort: Pulmonary effort is normal.  ?   Breath sounds: Normal breath sounds.  ?Abdominal:  ?   General: Bowel sounds are normal.  ?Neurological:  ?   Mental Status: He is alert.  ? ? ? ?No results found for any visits on 12/01/21. ? ? ? ?The ASCVD Risk score (Arnett DK, et al., 2019) failed to calculate for the following reasons: ?  The patient has a prior MI or stroke diagnosis ? ?  ?Assessment & Plan:  ? ?Problem List Items Addressed This Visit   ? ?  ? Cardiovascular and Mediastinum  ? Essential hypertension  ?  Blood pressure normotensive in office today.  Continue medication as prescribed ? ?  ?  ?  ? Endocrine  ? Type 2 diabetes with nephropathy ([place])  ?  Since to make any medication changes as patient is having adherence issues to medical therapies.  Did have discussion about  moving patient into assisted living.  I will continue patient's current diabetes regimen and fear of changing will cause a dramatic drop in his blood glucose leading to falls and further safety concerns ? ?  ?  ?  ? Nervous and Auditory  ? Mild cognitive impairment - Primary  ?  Has been on donepezil and sure patient is taking medication as prescribed.  Great concern for me for patient's safety living at home son feels very similarly has been looking into assisted living's.  We will see if we can help with resources to find assisted living's that will accept patient's insurance ? ?  ?  ?  ? Other  ? HLD (hyperlipidemia)  ? Open wnd of finger  ? Decreased renal function  ?  Patient had a marked increase in  serum creatinine and decreasing GFR.  Did discuss with patient this could be multifactorial in regards to uncontrolled diabetes or inadequate hydration.  We will recheck a BMP today ? ?  ?  ? Relevant Orders  ? Basic metabolic panel  ? ? ?Return for To be determined based on GFR lab work.  ? ? ?Romilda Garret, NP ? ?

## 2021-12-01 NOTE — Assessment & Plan Note (Signed)
Patient had a marked increase in serum creatinine and decreasing GFR.  Did discuss with patient this could be multifactorial in regards to uncontrolled diabetes or inadequate hydration.  We will recheck a BMP today ?

## 2021-12-01 NOTE — Patient Instructions (Signed)
Nice to see you guys ?I will be in touch with resources and lab results ?Make sure we are drinking enough to pee 3-5 times a day ?I will be in touch with follow up information  ?

## 2021-12-01 NOTE — Assessment & Plan Note (Signed)
Since to make any medication changes as patient is having adherence issues to medical therapies.  Did have discussion about moving patient into assisted living.  I will continue patient's current diabetes regimen and fear of changing will cause a dramatic drop in his blood glucose leading to falls and further safety concerns ?

## 2021-12-07 ENCOUNTER — Telehealth: Payer: Self-pay

## 2021-12-07 ENCOUNTER — Other Ambulatory Visit: Payer: Self-pay | Admitting: Nurse Practitioner

## 2021-12-07 DIAGNOSIS — E875 Hyperkalemia: Secondary | ICD-10-CM

## 2021-12-07 DIAGNOSIS — N183 Chronic kidney disease, stage 3 unspecified: Secondary | ICD-10-CM

## 2021-12-07 NOTE — Telephone Encounter (Signed)
This is how [facility] pill packs are set up: ? ?Carvedilol 3.125 mg - 1 BID (breakfast and evening meal) ?Gabapentin 300 mg - 2 BID (breakfast and evening meal) ?Furosemide 20 mg - 1 daily (breakfast) ?Allopurinol 100 mg - 1/2 tablet daily (breakfast) ?Tamsulosin 0.'4mg'$  - 1 daily (evening meal) ?Finasteride 5 mg - 1 daily (breakfast) ?Atorvastatin 80 mg - 1 tablet (evening meal) ?Glipizide ER 10 mg - 1 daily  (breakfast) ?Zetia 10 mg- 1 tablet daily (breakfast)  ?Donepezil '5mg'$  -1 tablet (evening meal) ?Amiodarone '200mg'$  - 1 tablet (breakfast) ?Eliquis '5mg'$  - take 1 tablet breakfast 1 tablet evening meal  ? ?Randy [name] comes in a vial from the manufacturer. ? ? ?

## 2021-12-07 NOTE — Chronic Care Management (AMB) (Signed)
Patient contacted with information about renewing his Entresto patient assistance. Forms complete and patient needs to come by office and sign forms and bring financial information to make a copy for manufacturer and submit. ? ?Charlene Brooke, CPP notified ? ?Krithi Bray, CCMA ?Health concierge  ?[phone]  ?

## 2021-12-07 NOTE — Progress Notes (Signed)
L[place] ? ?

## 2021-12-13 ENCOUNTER — Ambulatory Visit
Admission: RE | Admit: 2021-12-13 | Discharge: 2021-12-13 | Disposition: A | Discharge status: Home / Self Care | Payer: Medicare HMO | Source: Ambulatory Visit | Attending: Internal Medicine | Admitting: Internal Medicine

## 2021-12-13 ENCOUNTER — Ambulatory Visit: Payer: Medicare HMO | Admitting: Internal Medicine

## 2021-12-13 ENCOUNTER — Encounter: Payer: Self-pay | Admitting: Internal Medicine

## 2021-12-13 ENCOUNTER — Other Ambulatory Visit: Payer: Self-pay

## 2021-12-13 VITALS — BP 126/74 | HR 66 | Temp 97.7°F | Wt 190.0 lb

## 2021-12-13 DIAGNOSIS — S61209A Unspecified open wound of unspecified finger without damage to nail, initial encounter: Secondary | ICD-10-CM | POA: Diagnosis not present

## 2021-12-13 DIAGNOSIS — S61200A Unspecified open wound of right index finger without damage to nail, initial encounter: Secondary | ICD-10-CM | POA: Diagnosis not present

## 2021-12-13 DIAGNOSIS — M7989 Other specified soft tissue disorders: Secondary | ICD-10-CM | POA: Diagnosis not present

## 2021-12-13 NOTE — Progress Notes (Signed)
?  [facility]  ? ? ? ? ?Reason for Consult: concern for osteomyelitis of the finger    ?Referring Physician: Jeralyn Ruths PA-C ? ? ? Patient ID: Randy [name], male    DOB: 09-07-1945, 76 y.o.   MRN: [id number] ? ?HPI:   ?He is here for evaluation for concern for osteomyelitis of his right index finger.  He has a history of a distal amputation of the finger and had a residual fingernail and he recently caught his hand on something and it ripped off with a resultant ulcer left on the distal portion.  He is here with his son who is unaware of how it happened and the patient has some dementia so not many details available.  Since it happened, the site has had no drainage, has remained dry and without warmth or erythema.  He has noted it closing some as well.  He saw his PCP on 5/2 who did an xray which is reported as mild lucency with loss of distinct cortex raising suspicion for osteomyeltis.   ? ? ?Past Medical History:  ?Diagnosis Date  ? AICD (automatic cardioverter/defibrillator) present 10/18/2016  ? "replaced 2009-St. Jude device, Dr Lovena Le"  ? Anemia   ? Arthritis   ? "back" (08/27/2017)  ? CAD (coronary artery disease)   ? CHF (congestive heart failure) ([place])   ? CKD (chronic kidney disease) stage 3, GFR 30-59 ml/min (HCC)   ? [name]  ? Diverticulosis   ? Dyslipidemia   ? GERD (gastroesophageal reflux disease)   ? Hypertension   ? Neuromuscular disorder ([place])   ? neuropathy- in hands  ? Osteoarthritis   ? hips, hands  ? Pacemaker   ? Shortness of breath   ? Sleep apnea   ? started a sleep study but didn't completed, states PCP told him he has sleep  apnea, doesn't use CPAP  ? Systolic dysfunction   ? Type 2 diabetes with nephropathy ([place])   ? Vitamin D deficiency   ? ? ?Prior to Admission medications   ?Medication Sig Start Date End Date Taking? Authorizing Provider  ?Accu-Chek Softclix Lancets lancets USE AS DIRECTED  TO TEST BLOOD SUGAR ONE TIME DAILY  AND AS NEEDED 12/31/19  Yes  Elby Beck, FNP  ?Alcohol Swabs (B-D SINGLE USE SWABS REGULAR) PADS Use as instructed to clean area for glucose monitoring once daily and as needed.  Diagnosis:  E11.21  Non insulin-dependent 07/24/16  Yes Tonia Ghent, MD  ?allopurinol (ZYLOPRIM) 100 MG tablet Take 0.5 tablets (50 mg total) by mouth daily. 07/08/21  Yes Michela Pitcher, NP  ?amiodarone (PACERONE) 200 MG tablet Take 1 tablet (200 mg total) by mouth daily. 07/06/21  Yes Shirley Friar, PA-C  ?apixaban (ELIQUIS) 5 MG TABS tablet Take 1 tablet (5 mg total) by mouth 2 (two) times daily. 10/24/21  Yes Evans Lance, MD  ?atorvastatin (LIPITOR) 80 MG tablet TAKE 1 TABLET (80 MG TOTAL) DAILY AT 6 PM. 07/06/21  Yes Evans Lance, MD  ?Blood Glucose Calibration (ACCU-CHEK AVIVA) SOLN Use to calibrate blood glucose machine as recommended.  Diagnosis:  E11.21  Non-insulin dependent. 07/24/16  Yes Tonia Ghent, MD  ?Blood Glucose Monitoring Suppl (ACCU-CHEK GUIDE) w/Device KIT Use to check blood sugar once daily. 02/22/20  Yes Elby Beck, FNP  ?carvedilol (COREG) 3.125 MG tablet Take 1 tablet (3.125 mg total) by mouth 2 (two) times daily. 07/06/21  Yes Evans Lance, MD  ?donepezil (  ARICEPT) 5 MG tablet TAKE ONE TABLET BY MOUTH EVERY EVENING 09/22/21  Yes Michela Pitcher, NP  ?ezetimibe (ZETIA) 10 MG tablet TAKE ONE TABLET BY MOUTH EVERY MORNING 09/22/21  Yes Michela Pitcher, NP  ?finasteride (PROSCAR) 5 MG tablet Take 1 tablet (5 mg total) by mouth daily. 08/24/21  Yes Michela Pitcher, NP  ?furosemide (LASIX) 20 MG tablet TAKE ONE TABLET BY MOUTH EVERY MORNING 10/20/21  Yes Michela Pitcher, NP  ?gabapentin (NEURONTIN) 300 MG capsule TAKE TWO CAPSULES BY MOUTH TWICE DAILY 09/22/21  Yes Michela Pitcher, NP  ?glipiZIDE (GLUCOTROL XL) 10 MG 24 hr tablet TAKE ONE TABLET BY MOUTH EVERY MORNING 11/23/21  Yes Michela Pitcher, NP  ?glucose blood (ACCU-CHEK AVIVA PLUS) test strip Test Blood Sugar 1 time daily: Dx:E11.27 12/31/19  Yes Elby Beck, FNP  ?sacubitril-valsartan (ENTRESTO) 24-26 MG TAKE ONE TABLET BY MOUTH EVERY MORNING and TAKE ONE TABLET BY MOUTH EVERY EVENING 10/18/21  Yes Evans Lance, MD  ?tamsulosin (FLOMAX) 0.4 MG CAPS capsule Take 1 capsule (0.4 mg total) by mouth daily. 08/24/21  Yes Michela Pitcher, NP  ? ? ?No Known Allergies ? ?Social History  ? ?Tobacco Use  ? Smoking status: Former  ?  Packs/day: 2.00  ?  Years: 17.00  ?  Pack years: 34.00  ?  Types: Cigarettes  ?  Quit date: 07/30/1984  ?  Years since quitting: 37.3  ? Smokeless tobacco: Never  ?Vaping Use  ? Vaping Use: Never used  ?Substance Use Topics  ? Alcohol use: Yes  ?  Comment: 08/27/2017 "probably have had 6 beers in the last 6 months; drink when I play golf"  ? Drug use: No  ? ? ?Family History  ?Problem Relation Age of Onset  ? Heart attack Father 54  ? Diabetes Brother   ? ? ?Review of Systems ? Constitutional: no fever ?Integument/breast: no rash ?All other systems reviewed and are negative   ? ?Constitutional: in no apparent distress  ?Vitals:  ? 12/13/21 1500  ?BP: 126/74  ?Pulse: 66  ?Temp: 97.7 ?F (36.5 ?C)  ? ?EYES: anicteric ?Respiratory: normal respiratory effort ?Musculoskeletal: right index finger with an open ulcer, smaller compared to picture from 5/2.  No drainage, no wamrth, no erythema, no tenderness.  ? ? ?Labs: ?Lab Results  ?Component Value Date  ? WBC 9.1 11/28/2021  ? HGB 12.5 (L) 11/28/2021  ? HCT 37.2 (L) 11/28/2021  ? MCV 85.0 11/28/2021  ? PLT 205.0 11/28/2021  ?  ?Lab Results  ?Component Value Date  ? CREATININE 2.02 (H) 12/01/2021  ? BUN 37 (H) 12/01/2021  ? NA 135 12/01/2021  ? K 5.3 No hemolysis seen (H) 12/01/2021  ? CL 102 12/01/2021  ? CO2 25 12/01/2021  ?  ?Lab Results  ?Component Value Date  ? ALT 9 11/28/2021  ? AST 14 11/28/2021  ? ALKPHOS 80 11/28/2021  ? BILITOT 0.4 11/28/2021  ? INR 1.50 09/02/2017  ?  ? ?Assessment: finger ulcer.  There is a small area of concern on the bone for osteomyelitis per radiology but is certainly not  definitive and clinicially does not appear to be infected as it is dry, no warmth or signs of infection.  I suspect this is not osteomeylitis.  I discussed the options of treatment vs watchful waiting and they are with my recommendation to watch.  I will check inflammatory markers today and do a follow up xray in 2 weeks and reassess.  I have told him to stop his antibiotics at this time.  ? ?Plan: ?1)  CRP, ESR ?2) xray in 2 weeks and follow up at that time for reassessment.  ?

## 2021-12-14 LAB — C-REACTIVE PROTEIN: CRP: 15.7 mg/L — ABNORMAL HIGH (ref <8.0)

## 2021-12-14 LAB — SEDIMENTATION RATE: Sed Rate: 51 mm/h — ABNORMAL HIGH (ref 0–20)

## 2021-12-15 ENCOUNTER — Other Ambulatory Visit: Payer: Self-pay | Admitting: Nurse Practitioner

## 2021-12-15 ENCOUNTER — Other Ambulatory Visit (INDEPENDENT_AMBULATORY_CARE_PROVIDER_SITE_OTHER): Payer: Medicare HMO

## 2021-12-15 DIAGNOSIS — E1121 Type 2 diabetes mellitus with diabetic nephropathy: Secondary | ICD-10-CM

## 2021-12-15 DIAGNOSIS — N183 Chronic kidney disease, stage 3 unspecified: Secondary | ICD-10-CM

## 2021-12-15 LAB — BASIC METABOLIC PANEL
BUN: 25 mg/dL — ABNORMAL HIGH (ref 6–23)
CO2: 26 mEq/L (ref 19–32)
Calcium: 9.2 mg/dL (ref 8.4–10.5)
Chloride: 100 mEq/L (ref 96–112)
Creatinine, Ser: 1.73 mg/dL — ABNORMAL HIGH (ref 0.40–1.50)
GFR: 37.98 mL/min — ABNORMAL LOW (ref >60.00)
Glucose, Bld: 326 mg/dL — ABNORMAL HIGH (ref 70–99)
Potassium: 4.7 mEq/L (ref 3.5–5.1)
Sodium: 135 mEq/L (ref 135–145)

## 2021-12-15 LAB — MICROALBUMIN / CREATININE URINE RATIO
Creatinine,U: 43.1 mg/dL
Microalb Creat Ratio: 50.4 mg/g — ABNORMAL HIGH (ref 0.0–30.0)
Microalb, Ur: 21.7 mg/dL — ABNORMAL HIGH (ref 0.0–1.9)

## 2021-12-18 ENCOUNTER — Ambulatory Visit (INDEPENDENT_AMBULATORY_CARE_PROVIDER_SITE_OTHER): Payer: Medicare HMO | Admitting: *Deleted

## 2021-12-18 DIAGNOSIS — N183 Chronic kidney disease, stage 3 unspecified: Secondary | ICD-10-CM

## 2021-12-18 DIAGNOSIS — E1121 Type 2 diabetes mellitus with diabetic nephropathy: Secondary | ICD-10-CM

## 2021-12-18 DIAGNOSIS — S61209A Unspecified open wound of unspecified finger without damage to nail, initial encounter: Secondary | ICD-10-CM

## 2021-12-18 DIAGNOSIS — G3184 Mild cognitive impairment, so stated: Secondary | ICD-10-CM

## 2021-12-18 NOTE — Patient Instructions (Signed)
Visit Information  Thank you for taking time to visit with me today. Please don't hesitate to contact me if I can be of assistance to you before our next scheduled telephone appointment.    Our next appointment is by telephone on 01/19/22    Please call the care guide team at [phone] if you need to cancel or reschedule your appointment.   If you are experiencing a Mental Health or [place] or need someone to talk to, please call 911   The patient verbalized understanding of instructions, educational materials, and care plan provided today and DECLINED offer to receive copy of patient instructions, educational materials, and care plan.   Eduard Clos MSW, LCSW Licensed Clinical Social Worker [place]   [phone]

## 2021-12-18 NOTE — Chronic Care Management (AMB) (Signed)
Chronic Care Management    Clinical Social Work Note  12/18/2021 Name: Randy [name] MRN: [id number] DOB: 1946-02-09  Randy [name] is a 76 y.o. year old male who is a primary care patient of Exavier, Lina, NP. The CCM team was consulted to assist the patient with chronic disease management and/or care coordination needs related to: [employer]  and Level of Care Concerns.   Engaged with patient by telephone for follow up visit in response to provider referral for social work chronic care management and care coordination services.   Consent to Services:  The patient was given information about Chronic Care Management services, agreed to services, and gave verbal consent prior to initiation of services.  Please see initial visit note for detailed documentation.   Patient agreed to services and consent obtained.   Assessment: Review of patient past medical history, allergies, medications, and health status, including review of relevant consultants reports was performed today as part of a comprehensive evaluation and provision of chronic care management and care coordination services.     SDOH (Social Determinants of Health) assessments and interventions performed:    Advanced Directives Status: Not addressed in this encounter.  CCM Care Plan  No Known Allergies  Outpatient Encounter Medications as of 12/18/2021  Medication Sig   Accu-Chek Softclix Lancets lancets USE AS DIRECTED  TO TEST BLOOD SUGAR ONE TIME DAILY  AND AS NEEDED   Alcohol Swabs (B-D SINGLE USE SWABS REGULAR) PADS Use as instructed to clean area for glucose monitoring once daily and as needed.  Diagnosis:  E11.21  Non insulin-dependent   allopurinol (ZYLOPRIM) 100 MG tablet Take 0.5 tablets (50 mg total) by mouth daily.   amiodarone (PACERONE) 200 MG tablet Take 1 tablet (200 mg total) by mouth daily.   apixaban (ELIQUIS) 5 MG TABS tablet Take 1 tablet (5 mg total) by mouth 2 (two) times daily.   atorvastatin  (LIPITOR) 80 MG tablet TAKE 1 TABLET (80 MG TOTAL) DAILY AT 6 PM.   Blood Glucose Calibration (ACCU-CHEK AVIVA) SOLN Use to calibrate blood glucose machine as recommended.  Diagnosis:  E11.21  Non-insulin dependent.   Blood Glucose Monitoring Suppl (ACCU-CHEK GUIDE) w/Device KIT Use to check blood sugar once daily.   carvedilol (COREG) 3.125 MG tablet Take 1 tablet (3.125 mg total) by mouth 2 (two) times daily.   donepezil (ARICEPT) 5 MG tablet TAKE ONE TABLET BY MOUTH EVERY EVENING   ezetimibe (ZETIA) 10 MG tablet TAKE ONE TABLET BY MOUTH EVERY MORNING   finasteride (PROSCAR) 5 MG tablet Take 1 tablet (5 mg total) by mouth daily.   furosemide (LASIX) 20 MG tablet TAKE ONE TABLET BY MOUTH EVERY MORNING   gabapentin (NEURONTIN) 300 MG capsule TAKE TWO CAPSULES BY MOUTH TWICE DAILY   glipiZIDE (GLUCOTROL XL) 10 MG 24 hr tablet TAKE ONE TABLET BY MOUTH EVERY MORNING   glucose blood (ACCU-CHEK AVIVA PLUS) test strip Test Blood Sugar 1 time daily: Dx:E11.27   sacubitril-valsartan (ENTRESTO) 24-26 MG TAKE ONE TABLET BY MOUTH EVERY MORNING and TAKE ONE TABLET BY MOUTH EVERY EVENING   tamsulosin (FLOMAX) 0.4 MG CAPS capsule Take 1 capsule (0.4 mg total) by mouth daily.   No facility-administered encounter medications on file as of 12/18/2021.    Patient Active Problem List   Diagnosis Date Noted   Decreased renal function 12/01/2021   Open wnd of finger 11/28/2021   Mild cognitive impairment 03/10/2021   Mass of right foot 03/10/2021   Idiopathic gout of  multiple sites 10/07/2018   Grief 06/16/2018   Dizziness 06/16/2018   Allergic rhinitis 04/24/2018   S/P CABG x 3 09/02/2017   Leukocytosis 08/27/2017   Visit for well man health check 05/07/2017   COPD (chronic obstructive pulmonary disease) ([place]) 72/03/4708   Chronic systolic CHF (congestive heart failure) ([place]) 10/18/2016   Generalized OA 06/22/2014   Hypotension due to drugs 03/22/2014   CKD (chronic kidney disease) stage 3, GFR 30-59  ml/min (HCC)    Synovial cyst of lumbar spine 07/17/2013    Class: Diagnosis of   Osteoarthritis of left hip 09/17/2012   Automatic implantable cardioverter-defibrillator in situ - St Jude 62/83/6629   Chronic systolic heart failure ([place]) 05/01/2011   IMPOTENCE OF ORGANIC ORIGIN 02/20/2010   Vitamin D deficiency 02/14/2010   Type 2 diabetes with nephropathy ([place]) 01/11/2010   Implantable cardioverter-defibrillator (ICD) in situ 05/10/2009   HLD (hyperlipidemia) 02/24/2008   Essential hypertension 02/24/2008   Non-ST elevation (NSTEMI) myocardial infarction ([place]) 02/24/2008   HYPERSOMNIA UNSPECIFIED 02/24/2008    Conditions to be addressed/monitored: Dementia; Limited social support, Level of care concerns, and Memory Deficits  Care Plan : LCSW Plan of Care  Updates made by [name], LCSW since 12/18/2021 12:00 AM     Problem: Mobility and Independence      Long-Range Goal: Mobility and Independence Optimized   Start Date: 05/24/2021  Expected End Date: 02/25/2022  This Visit's Progress: On track  Recent Progress: On track  Priority: High  Note:   Current barriers:   Patient in need of assistance with connecting to community resources for Limited social support, Level of care concerns, and Cognitive Deficits Acknowledges deficits with meeting this unmet need Patient is unable to independently navigate community resource options without care coordination support Clinical Goals:  patient will demonstrate improved health management independence as evidenced byfamily report, overall activity/outcomes Clinical Interventions:  12/18/21- CSW spoke with pt's son who reports pt has good and bad days- he continues to be concerned about his dad taking his RX correctly; stating "he probably gets them/right 50% of the time".  CSW will alert Pharmacist to this.   [name] indicates his father still mowing and goes to hit balls some- he also goes out for breakfast with a neighbor about  3-4 times weekly. Pt continues to deal with a finger wound and is seeing an ID specialist for this.  [name] continues to consider placement for pt in an ALF/memory care setting- unsure if pt will agree but has mentioned it to him a few times.  CSW discussed the cost, process, etc with son again- will ask Care Guide to follow up with son with ALF resources.     Collaboration with Michela Pitcher, NP regarding development and update of comprehensive plan of care as evidenced by provider attestation and co-signature Inter-disciplinary care team collaboration (see longitudinal plan of care) Assessment of needs, barriers , agencies contacted, as well as how impacting  Review various resources, discussed options and provided patient information about Limited social support, Level of care concerns, Social Isolation, and Memory Deficits Collaborated with appropriate clinical care team members regarding patient needs Memory Deficits, Dementia Referral placed via [place] [facility] Patient interviewed and appropriate assessments performed Other interventions provided: Reviewed mental health medications with patient and discussed importance of compliance:  Increase in actives / exercise encouraged  Patient Goals:  - continue taking medications as prescribed Follow Up Plan: SW will follow up with patient by phone over the next  45 days        Follow Up Plan: Appointment scheduled for SW follow up with client by phone on: 01/19/22      Eduard Clos MSW, [place] Licensed Clinical Social Worker [place]   [phone]

## 2021-12-19 ENCOUNTER — Telehealth: Payer: Self-pay

## 2021-12-19 NOTE — Telephone Encounter (Signed)
   Telephone encounter was:  Successful.  12/19/2021 Name: Randy [name] MRN: [id number] DOB: 09-04-1945  [name] is a 76 y.o. year old male who is a primary care patient of Courtney, Fenlon, NP . The community resource team was consulted for assistance with  ALF's   Care guide performed the following interventions: Patient provided with information about care guide support team and interviewed to confirm resource needs. Son advised at this time the only resource needed is assisted living facilities for patient. Son provided e-mail for CG to send resources.  Follow Up Plan:  Care guide will outreach resources to assist patient with ALF's  [place], [place]  Main Phone: [phone]  E-mail: [email or web address]  Website: [email or web address]

## 2021-12-20 ENCOUNTER — Other Ambulatory Visit: Payer: Self-pay | Admitting: Nurse Practitioner

## 2021-12-20 ENCOUNTER — Telehealth: Payer: Self-pay

## 2021-12-20 DIAGNOSIS — M79671 Pain in right foot: Secondary | ICD-10-CM

## 2021-12-20 NOTE — Telephone Encounter (Signed)
   Telephone encounter was:   Unsuccessful. E-mail encounter was: Successful  12/20/2021 Name: Randy [name] MRN: [id number] DOB: 1945/11/18  [name] is a 77 y.o. year old male who is a primary care patient of Nizar, Cutler, NP . The community resource team was consulted for assistance with  ALF's  Care guide performed the following interventions: Follow up call placed to the patient to discuss status of referral. Son responded to "Test" e-mail sent. CG then sent ALF's to son by e-mail per request and called to advise of this information. Message was left. Son has CG e-mail and phone contact.  Follow Up Plan:  No further follow up planned at this time. The patient has been provided with needed resources.  Industry management  [place], [place]  Main Phone: [phone]  E-mail: [name]  Website: [email or web address]

## 2021-12-21 ENCOUNTER — Telehealth: Payer: Self-pay

## 2021-12-21 NOTE — Chronic Care Management (AMB) (Signed)
Chronic Care Management Pharmacy Assistant   Name: Randy [name]  MRN: [id number] DOB: 1946/01/19   Reason for Encounter: Medication Adherence and Delivery Coordination    Recent office visits:  12/18/21-Randy [name](fam.med)-telemedicine spoke with the son, discussed community resources. 12/01/21-Lothar [name],NP(fam.med.) f/u cognitive impairment,no medication changes  11/28/21-Keinan [name],NP(PCP)- f/u diabetes, new labs(A1c 10.0 , potassium high) no medication changes   Recent consult visits:  12/13/21-Robert Comer,MD(infect.disease) finger wound.xray,labs, f/u 2 weeks  Hospital visits:  None in previous 6 months  Medications: Outpatient Encounter Medications as of 12/21/2021  Medication Sig   Accu-Chek Softclix Lancets lancets USE AS DIRECTED  TO TEST BLOOD SUGAR ONE TIME DAILY  AND AS NEEDED   Alcohol Swabs (B-D SINGLE USE SWABS REGULAR) PADS Use as instructed to clean area for glucose monitoring once daily and as needed.  Diagnosis:  E11.21  Non insulin-dependent   allopurinol (ZYLOPRIM) 100 MG tablet Take 0.5 tablets (50 mg total) by mouth daily.   amiodarone (PACERONE) 200 MG tablet Take 1 tablet (200 mg total) by mouth daily.   apixaban (ELIQUIS) 5 MG TABS tablet Take 1 tablet (5 mg total) by mouth 2 (two) times daily.   atorvastatin (LIPITOR) 80 MG tablet TAKE 1 TABLET (80 MG TOTAL) DAILY AT 6 PM.   Blood Glucose Calibration (ACCU-CHEK AVIVA) SOLN Use to calibrate blood glucose machine as recommended.  Diagnosis:  E11.21  Non-insulin dependent.   Blood Glucose Monitoring Suppl (ACCU-CHEK GUIDE) w/Device KIT Use to check blood sugar once daily.   carvedilol (COREG) 3.125 MG tablet Take 1 tablet (3.125 mg total) by mouth 2 (two) times daily.   donepezil (ARICEPT) 5 MG tablet TAKE ONE TABLET BY MOUTH EVERY EVENING   ezetimibe (ZETIA) 10 MG tablet TAKE ONE TABLET BY MOUTH EVERY MORNING   finasteride (PROSCAR) 5 MG tablet Take 1 tablet (5 mg total) by mouth daily.    furosemide (LASIX) 20 MG tablet TAKE ONE TABLET BY MOUTH EVERY MORNING   gabapentin (NEURONTIN) 300 MG capsule TAKE TWO CAPSULES BY MOUTH TWICE DAILY   glipiZIDE (GLUCOTROL XL) 10 MG 24 hr tablet TAKE ONE TABLET BY MOUTH EVERY MORNING   glucose blood (ACCU-CHEK AVIVA PLUS) test strip Test Blood Sugar 1 time daily: Dx:E11.27   sacubitril-valsartan (ENTRESTO) 24-26 MG TAKE ONE TABLET BY MOUTH EVERY MORNING and TAKE ONE TABLET BY MOUTH EVERY EVENING   tamsulosin (FLOMAX) 0.4 MG CAPS capsule Take 1 capsule (0.4 mg total) by mouth daily.   No facility-administered encounter medications on file as of 12/21/2021.    BP Readings from Last 3 Encounters:  12/13/21 126/74  12/01/21 (!) 108/56  11/28/21 (!) 80/50    Lab Results  Component Value Date   HGBA1C 10.0 (A) 11/28/2021      Recent OV, Consult or Hospital visit:  No medication changes indicated   Last adherence delivery date:12/05/21      Patient is due for next adherence delivery on: 01/03/22  Spoke with patient on 12/21/21 reviewed medications and coordinated delivery.  This delivery to include: Adherence Packaging  30 Days  Packs: Carvedilol 3.125 mg - 1 BID (breakfast and evening meal) Gabapentin 300 mg - 2 BID (breakfast and evening meal) Furosemide 20 mg - 1 daily (breakfast) Allopurinol 100 mg - 1/2 tablet daily (breakfast) Tamsulosin 0.81m - 1 daily (evening meal) Finasteride 5 mg - 1 daily (breakfast) Atorvastatin 80 mg - 1 tablet (evening meal) Glipizide ER 10 mg - 1 daily  (breakfast) Zetia 10 mg- 1 tablet daily (breakfast)  Donepezil 84m -1 tablet (evening meal) Amiodarone 2054m- 1 tablet (breakfast) Eliquis 28m228m take 1 tablet breakfast 1 tablet evening meal   VIAL medications: Entresto 24-49m22mke 1 tablet am 1 tablet pm -gets from manufacturer ( informed [facility])   Patient declined the following medications this month: none   Any concerns about your medications?  No  How often do you forget or accidentally miss a dose? Once a month  Do you use a pillbox? No  Is patient in packaging Yes  What is the date on your next pill pack? Patient unable to read date on package  Any concerns or issues with your packaging? Patient states he likes the packs    Refills requested from providers include: Gabapentin   Confirmed delivery date of 01/03/22, advised patient that pharmacy will contact them the morning of delivery.  Recent blood pressure readings are as follows:none available   Recent blood glucose readings are as follows: none available   Annual wellness visit in last year? No Most Recent BP reading:126/74  66-P  12/13/21  If Diabetic: Most recent A1C reading: 10.0   11/28/21 Last eye exam / retinopathy screening:2020 Last diabetic foot exam: UTD  [facility] notified  CCM: 01/16/22  VelmAvel SensorMASandovalistant [phone]

## 2021-12-26 ENCOUNTER — Ambulatory Visit (INDEPENDENT_AMBULATORY_CARE_PROVIDER_SITE_OTHER): Payer: Medicare HMO

## 2021-12-26 DIAGNOSIS — I5022 Chronic systolic (congestive) heart failure: Secondary | ICD-10-CM | POA: Diagnosis not present

## 2021-12-26 DIAGNOSIS — Z95810 Presence of automatic (implantable) cardiac defibrillator: Secondary | ICD-10-CM | POA: Diagnosis not present

## 2021-12-27 ENCOUNTER — Encounter: Payer: Self-pay | Admitting: Internal Medicine

## 2021-12-27 ENCOUNTER — Other Ambulatory Visit: Payer: Self-pay

## 2021-12-27 ENCOUNTER — Ambulatory Visit: Payer: Medicare HMO | Admitting: Internal Medicine

## 2021-12-27 VITALS — BP 147/73 | HR 65 | Temp 97.5°F | Wt 190.0 lb

## 2021-12-27 DIAGNOSIS — E1121 Type 2 diabetes mellitus with diabetic nephropathy: Secondary | ICD-10-CM

## 2021-12-27 DIAGNOSIS — S61209D Unspecified open wound of unspecified finger without damage to nail, subsequent encounter: Secondary | ICD-10-CM | POA: Diagnosis not present

## 2021-12-27 DIAGNOSIS — N183 Chronic kidney disease, stage 3 unspecified: Secondary | ICD-10-CM

## 2021-12-27 LAB — SEDIMENTATION RATE: Sed Rate: 22 mm/h — ABNORMAL HIGH (ref 0–20)

## 2021-12-27 NOTE — Assessment & Plan Note (Addendum)
Looks good, healing.  Does not look infected clinicially.  Will repeat ESR today and repeat the xray in 3 weeks.  If no progression, I would not consider this infected.  Will notify him after the results.    I have personally spent 20 minutes involved in face-to-face and non-face-to-face activities for this patient on the day of the visit. Professional time spent includes the following activities: Preparing to see the patient (review of tests), Obtaining and/or reviewing separately obtained history (admission/discharge record), Performing a medically appropriate examination and/or evaluation , Ordering medications/tests/procedures, referring and communicating with other health care professionals, Documenting clinical information in the EMR, Independently interpreting results (not separately reported), Communicating results to the patient/family/caregiver, Counseling and educating the patient/family/caregiver and Care coordination (not separately reported).

## 2021-12-27 NOTE — Progress Notes (Signed)
   Subjective:    Patient ID: Randy [name], male    DOB: 05-12-1946, 76 y.o.   MRN: [id number]  HPI Here for follow up of wound of his right pointer finger.   He was referred with concern for ostoemyelitis and has a history of distal amputation of it and had torn off the nail that had remained.  Initial xray noted a lytic lesion.  Follow up xray 2 weeks later about the same (was supposed to wait another 2 weeks for the xray).  No changes since then, it remains dry, clean, no warmth, no edema no concerns.  ESR 51.     Review of Systems  Constitutional:  Negative for chills, fatigue and fever.  Gastrointestinal:  Negative for diarrhea.  Skin:  Negative for rash.      Objective:   Physical Exam Eyes:     General: No scleral icterus. Pulmonary:     Effort: Pulmonary effort is normal.  Musculoskeletal:     Comments: Finger dry, no erythema, no drainage  Neurological:     Mental Status: He is alert.          Assessment & Plan:

## 2021-12-29 NOTE — Progress Notes (Signed)
EPIC Encounter for ICM Monitoring  Patient Name: Randy [name] is a 76 y.o. male Date: 12/29/2021 Primary Care Physican: Michela Pitcher, NP Primary Cardiologist: Lovena Le Electrophysiologist: Lovena Le 12/27/2021 Office Weight: 190 lbs   AT/AF Burden: 7.0%      Transmission reviewed.    Corvue thoracic impedance suggesting normal fluid levels.     Prescribed: Furosemide 20 mg 1 tablet daily   Labs: 06/29/2021 Creatinine 1.66, BUN , Potassium 3.8, Sodium 137, GFR 40.04 03/10/2021 Creatinine 1.86, BUN 32, Potassium 3.9, Sodium 138, GFR 35 A complete set of results can be found in Results Review.   Recommendations: No changes.    Follow-up plan: ICM clinic phone appointment on 02/05/2022.  91 day device clinic remote transmission scheduled for 01/02/2022.    EP/Cardiology Office Visits:  Missed appointment 09/13/2021 and 11/22/2021 with Oda Kilts, PA.     Copy of ICM check sent to Dr. Lovena Le.    3 month ICM trend: 12/27/2021.    12-14 Month ICM trend:     Rosalene Billings, RN 12/29/2021 2:21 PM

## 2022-01-01 NOTE — Telephone Encounter (Signed)
Received updated forms signed by patient. Still missing some patient documents: Proof of income documents - first 2 pages of [place] Tax Return or [place] form Traditional Medicare insurance card (Red/white/blue card)

## 2022-01-02 ENCOUNTER — Telehealth: Payer: Self-pay

## 2022-01-02 ENCOUNTER — Ambulatory Visit (INDEPENDENT_AMBULATORY_CARE_PROVIDER_SITE_OTHER): Payer: Medicare PPO

## 2022-01-02 DIAGNOSIS — I255 Ischemic cardiomyopathy: Secondary | ICD-10-CM

## 2022-01-02 LAB — CUP PACEART REMOTE DEVICE CHECK
Battery Remaining Longevity: 46 mo
Battery Remaining Percentage: 48 %
Battery Voltage: 2.92 V
Brady Statistic AP VP Percent: 1 %
Brady Statistic AP VS Percent: 5.9 %
Brady Statistic AS VP Percent: 1 %
Brady Statistic AS VS Percent: 89 %
Brady Statistic RA Percent Paced: 1 %
Brady Statistic RV Percent Paced: 1.8 %
Date Time Interrogation Session: 20230604232715
HighPow Impedance: 73 Ohm
HighPow Impedance: 80 Ohm
Implantable Lead Implant Date: 20091022
Implantable Lead Implant Date: 20180322
Implantable Lead Location: 753859
Implantable Lead Location: 753860
Implantable Lead Model: 7121
Implantable Pulse Generator Implant Date: 20180322
Lead Channel Impedance Value: 1875 Ohm
Lead Channel Impedance Value: 300 Ohm
Lead Channel Pacing Threshold Amplitude: 0.75 V
Lead Channel Pacing Threshold Amplitude: 1.25 V
Lead Channel Pacing Threshold Pulse Width: 0.5 ms
Lead Channel Pacing Threshold Pulse Width: 1 ms
Lead Channel Sensing Intrinsic Amplitude: 1.8 mV
Lead Channel Sensing Intrinsic Amplitude: 12 mV
Lead Channel Setting Pacing Amplitude: 2 V
Lead Channel Setting Pacing Amplitude: 3 V
Lead Channel Setting Pacing Pulse Width: 0.8 ms
Lead Channel Setting Sensing Sensitivity: 0.5 mV
Pulse Gen Serial Number: 7413245

## 2022-01-02 NOTE — Telephone Encounter (Signed)
Scheduled remote reviewed. Normal device function. Tachy episode of ATP/Shock. Episode of AF began on 12/31/21 at 1214, rates increased at 1440 with rate avg 180-190, ATP x3 (unsuccessful), ICD shock 15J converted to AsVs. OAC- Eliquis, on Amiodarone. Routing for further review.   Spoke to patients son Randy [name]) who was with patient during phone conversation today. Patient does not recall shock but also has memory issues. Patient is compliant with medications on file about 50% of the time, sometimes his son Randy [name] helps him and other times patient will take them, again patient has memory issues. Address with Randy [name] patient has missed 2 previous follow up apts. With Randy [name], Randy [name] did confirm that patient would be able to come into office is apt was scheduled for f/u. Advised shock plan and no driving per [place] DMV x6 months until otherwise noted after reviewed by provider for possible inappropriate shock. Voiced understanding.   Advised I will forward to scheduling about f/u visit and to [facility] for follow up to update. Appreciative for call.

## 2022-01-03 NOTE — Progress Notes (Signed)
Called patient's son; no answer; left message  Charlene Brooke, CPP notified  Marijean Niemann, [place] Clinical Pharmacy Assistant [phone]

## 2022-01-08 NOTE — Progress Notes (Deleted)
Electrophysiology Office Note Date: 01/08/2022  ID:  Randy [name], DOB 1946-03-11, MRN [id number]  PCP: Michela Pitcher, NP Primary Cardiologist: None Electrophysiologist: Cristopher Peru, [name]   CC: Routine ICD follow-up  Randy [name] is a 76 y.o. male seen today for Cristopher Peru, [name] for routine EP follow up. Has missed last several follow ups despite ICD therapies.   Most recent therapy 12/31/2021 appears to potentially be inappropriate for AF with RVR vs dual tachycardia.   Since last being seen in clinic, patient reports ***.  Pt has issues with memory and in the past has complained of frequently forgetting his medications. He denies symptoms of palpitations, chest pain, shortness of breath, orthopnea, PND, lower extremity edema, claudication, dizziness, presyncope, syncope, bleeding, or neurologic sequela. The patient is tolerating medications without difficulties.    Device History: St. Jude Dual Chamber ICD implanted 1/79/1505 for systolic CHF History of appropriate therapy: No History of AAD therapy: Yes, on amiodarone  Past Medical History:  Diagnosis Date   AICD (automatic cardioverter/defibrillator) present 10/18/2016   "replaced 2009-St. Jude device, Dr Lovena Le"   Anemia    Arthritis    "back" (08/27/2017)   CAD (coronary artery disease)    CHF (congestive heart failure) (HCC)    CKD (chronic kidney disease) stage 3, GFR 30-59 ml/min (HCC)    [name]   Diverticulosis    Dyslipidemia    GERD (gastroesophageal reflux disease)    Hypertension    Neuromuscular disorder (HCC)    neuropathy- in hands   Osteoarthritis    hips, hands   Pacemaker    Shortness of breath    Sleep apnea    started a sleep study but didn't completed, states PCP told him he has sleep  apnea, doesn't use CPAP   Systolic dysfunction    Type 2 diabetes with nephropathy (HCC)    Vitamin D deficiency    Past Surgical History:  Procedure Laterality Date   ANTERIOR CERVICAL DECOMP/DISCECTOMY  FUSION     BACK SURGERY     CARPAL TUNNEL RELEASE Left    CORONARY ARTERY BYPASS GRAFT N/A 09/02/2017   Procedure: CORONARY ARTERY BYPASS GRAFTING (CABG) x three , using left internal mammary artery and right leg greater saphenous vein harvested endoscopically;  Surgeon: Randy [name];  Location: [place] [facility];  Service: Open Heart Surgery;  Laterality: N/A;   CYST EXCISION     "back"   ICD IMPLANT N/A 10/18/2016   Procedure: ICD Implant- Upgrade to Dual Chamber;  Surgeon: Evans Lance, [name];  Location: [place] [facility];  Service: Cardiovascular;  Laterality: N/A;   INSERT / REPLACE / REMOVE PACEMAKER     JOINT REPLACEMENT     LEFT HEART CATH AND CORONARY ANGIOGRAPHY N/A 08/30/2017   Procedure: LEFT HEART CATH AND CORONARY ANGIOGRAPHY;  Surgeon: Martinique, Peter M, [name];  Location: [place] [facility];  Service: Cardiovascular;  Laterality: N/A;   LUMBAR LAMINECTOMY Left 07/17/2013   Procedure: MICRODISCECTOMY LUMBAR LAMINECTOMY;  Surgeon: Randy [name];  Location: [facility];  Service: Orthopedics;  Laterality: Left;  Left L4 Laminotomy, Lateral Recess Decompression, Cyst Excision   SHOULDER OPEN ROTATOR CUFF REPAIR Left    TEE WITHOUT CARDIOVERSION N/A 09/02/2017   Procedure: TRANSESOPHAGEAL ECHOCARDIOGRAM (TEE);  Surgeon: Randy [name];  Location: [place];  Service: Open Heart Surgery;  Laterality: N/A;   TOTAL HIP ARTHROPLASTY Left 09/17/2012   Procedure: Left TOTAL HIP ARTHROPLASTY ANTERIOR APPROACH;  Surgeon: Randy Farr  Lorin Mercy, [name];  Location: [facility];  Service: Orthopedics;  Laterality: Left;    Current Outpatient Medications  Medication Sig Dispense Refill   Accu-Chek Softclix Lancets lancets USE AS DIRECTED  TO TEST BLOOD SUGAR ONE TIME DAILY  AND AS NEEDED 200 each 2   Alcohol Swabs (B-D SINGLE USE SWABS REGULAR) PADS Use as instructed to clean area for glucose monitoring once daily and as needed.  Diagnosis:  E11.21  Non insulin-dependent 100 each 3   allopurinol (ZYLOPRIM) 100 MG tablet Take 0.5  tablets (50 mg total) by mouth daily. 15 tablet 2   amiodarone (PACERONE) 200 MG tablet Take 1 tablet (200 mg total) by mouth daily. 90 tablet 3   apixaban (ELIQUIS) 5 MG TABS tablet Take 1 tablet (5 mg total) by mouth 2 (two) times daily. 60 tablet 5   atorvastatin (LIPITOR) 80 MG tablet TAKE 1 TABLET (80 MG TOTAL) DAILY AT 6 PM. 90 tablet 3   Blood Glucose Calibration (ACCU-CHEK AVIVA) SOLN Use to calibrate blood glucose machine as recommended.  Diagnosis:  E11.21  Non-insulin dependent. 1 each 2   Blood Glucose Monitoring Suppl (ACCU-CHEK GUIDE) w/Device KIT Use to check blood sugar once daily. 1 kit 0   carvedilol (COREG) 3.125 MG tablet Take 1 tablet (3.125 mg total) by mouth 2 (two) times daily. 60 tablet 3   donepezil (ARICEPT) 5 MG tablet TAKE ONE TABLET BY MOUTH EVERY EVENING 90 tablet 1   ezetimibe (ZETIA) 10 MG tablet TAKE ONE TABLET BY MOUTH EVERY MORNING 90 tablet 2   finasteride (PROSCAR) 5 MG tablet Take 1 tablet (5 mg total) by mouth daily. 30 tablet 5   furosemide (LASIX) 20 MG tablet TAKE ONE TABLET BY MOUTH EVERY MORNING 30 tablet 2   gabapentin (NEURONTIN) 300 MG capsule TAKE TWO CAPSULES BY MOUTH TWICE DAILY 120 capsule 2   glipiZIDE (GLUCOTROL XL) 10 MG 24 hr tablet TAKE ONE TABLET BY MOUTH EVERY MORNING 90 tablet 1   glucose blood (ACCU-CHEK AVIVA PLUS) test strip Test Blood Sugar 1 time daily: Dx:E11.27 100 each 3   sacubitril-valsartan (ENTRESTO) 24-26 MG TAKE ONE TABLET BY MOUTH EVERY MORNING and TAKE ONE TABLET BY MOUTH EVERY EVENING 60 tablet 0   tamsulosin (FLOMAX) 0.4 MG CAPS capsule Take 1 capsule (0.4 mg total) by mouth daily. 30 capsule 5   No current facility-administered medications for this visit.    Allergies:   Patient has no known allergies.   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   Desires CPR, does want life support if not futile. 05/30/2020- discussed, patient would want CPR/ intubation. Does not have HCPOA/ living will      Social Determinants of Health   Financial Resource Strain: Low Risk  (10/17/2021)   Overall Financial Resource Strain (CARDIA)    Difficulty of Paying Living Expenses: Not very hard  Food Insecurity: No Food Insecurity (10/17/2021)   Hunger Vital Sign    Worried About Running Out of Food in the Last Year: Never true    Ran Out of Food in the Last Year: Never true  Transportation Needs: No Transportation Needs (05/24/2021)   PRAPARE - Hydrologist (Medical): No    Lack of Transportation (Non-Medical): No  Physical Activity: Not on file  Stress: Not on file  Social Connections: Not on file  Intimate Partner Violence: Not on file    Family History: Family History  Problem Relation Age of Onset   Heart attack Father 35   Diabetes Brother    Review of systems complete and found to be negative unless listed in HPI.     Physical Exam: There were no vitals filed for this visit.    General: Pleasant, NAD. No resp difficulty Psych: Normal affect. HEENT:  Normal, without mass or lesion.         Neck: Supple, no bruits or JVD. Carotids 2+. No lymphadenopathy/thyromegaly appreciated. Heart: PMI  nondisplaced. RRR no s3, s4, or murmurs. Lungs:  Resp regular and unlabored, CTA. Abdomen: Soft, non-tender, non-distended, No HSM, BS + x 4.   Extremities: No clubbing, cyanosis or edema. DP/PT/Radials 2+ and equal bilaterally. Neuro: Alert and oriented X 3. Moves all extremities spontaneously.   ICD interrogation- reviewed in detail today,  See PACEART report  EKG:  EKG is ordered today. Personal review of EKG ordered today shows ***  Recent Labs: 03/10/2021: TSH 1.45 11/28/2021: ALT 9; Hemoglobin 12.5; Platelets 205.0 12/15/2021: BUN 25; Creatinine, Ser 1.73; Potassium 4.7; Sodium 135   Wt Readings from Last 3 Encounters:  12/27/21 190 lb (86.2 kg)  12/13/21 190 lb (86.2 kg)  12/01/21 189 lb 2 oz (85.8 kg)     Other studies Reviewed: Additional studies/ records that were reviewed today include: Previous EP office notes.   Assessment and Plan:  1.  Chronic systolic dysfunction s/p St. Jude dual chamber ICD  Volume status *** Stable on an appropriate medical regimen Normal ICD function See Pace Art report  2. NSVT/VT Labs today.  Has had both appropriate and inappropriate therapies in the past.  Continue amiodarone 200 mg daily. He is not a candidate for other AADs with h/o non-compliance.  Reviewed [place] driving restrictions.   3. PAF EKG today shows ***  Continue eliquis  4. Dementia On Aricept  Current medicines are reviewed at length with the patient today  Disposition:   Follow up with ***     Signed, Shirley Friar, PA-C  01/08/2022 8:21 AM  [facility] [address] [place] [address] [phone] (office) [phone] (fax)

## 2022-01-11 ENCOUNTER — Telehealth: Payer: Self-pay

## 2022-01-11 NOTE — Chronic Care Management (AMB) (Signed)
    Chronic Care Management Pharmacy Assistant   Name: Randy [name]  MRN: [id number] DOB: Mar 26, 1946  Reason for Encounter: Reminder Call   Conditions to be addressed/monitored: CHF, HTN, HLD, COPD, and CKD Stage 3,DM  Medications: Outpatient Encounter Medications as of 01/11/2022  Medication Sig   Accu-Chek Softclix Lancets lancets USE AS DIRECTED  TO TEST BLOOD SUGAR ONE TIME DAILY  AND AS NEEDED   Alcohol Swabs (B-D SINGLE USE SWABS REGULAR) PADS Use as instructed to clean area for glucose monitoring once daily and as needed.  Diagnosis:  E11.21  Non insulin-dependent   allopurinol (ZYLOPRIM) 100 MG tablet Take 0.5 tablets (50 mg total) by mouth daily.   amiodarone (PACERONE) 200 MG tablet Take 1 tablet (200 mg total) by mouth daily.   apixaban (ELIQUIS) 5 MG TABS tablet Take 1 tablet (5 mg total) by mouth 2 (two) times daily.   atorvastatin (LIPITOR) 80 MG tablet TAKE 1 TABLET (80 MG TOTAL) DAILY AT 6 PM.   Blood Glucose Calibration (ACCU-CHEK AVIVA) SOLN Use to calibrate blood glucose machine as recommended.  Diagnosis:  E11.21  Non-insulin dependent.   Blood Glucose Monitoring Suppl (ACCU-CHEK GUIDE) w/Device KIT Use to check blood sugar once daily.   carvedilol (COREG) 3.125 MG tablet Take 1 tablet (3.125 mg total) by mouth 2 (two) times daily.   donepezil (ARICEPT) 5 MG tablet TAKE ONE TABLET BY MOUTH EVERY EVENING   ezetimibe (ZETIA) 10 MG tablet TAKE ONE TABLET BY MOUTH EVERY MORNING   finasteride (PROSCAR) 5 MG tablet Take 1 tablet (5 mg total) by mouth daily.   furosemide (LASIX) 20 MG tablet TAKE ONE TABLET BY MOUTH EVERY MORNING   gabapentin (NEURONTIN) 300 MG capsule TAKE TWO CAPSULES BY MOUTH TWICE DAILY   glipiZIDE (GLUCOTROL XL) 10 MG 24 hr tablet TAKE ONE TABLET BY MOUTH EVERY MORNING   glucose blood (ACCU-CHEK AVIVA PLUS) test strip Test Blood Sugar 1 time daily: Dx:E11.27   sacubitril-valsartan (ENTRESTO) 24-26 MG TAKE ONE TABLET BY MOUTH EVERY MORNING and TAKE ONE  TABLET BY MOUTH EVERY EVENING   tamsulosin (FLOMAX) 0.4 MG CAPS capsule Take 1 capsule (0.4 mg total) by mouth daily.   No facility-administered encounter medications on file as of 01/11/2022.   [name] was contacted to remind of upcoming telephone visit with Charlene Brooke on 01/16/22 at 3:45pm. Patient was reminded to have any blood glucose and blood pressure readings available for review at appointment.   Message was left reminding patient of appointment.   CCM referral has been placed prior to visit?  Yes    Star Rating Drugs: Medication:  Last Fill: Day Supply Atorvastatin 96m 12/26/21 30 Glipizide 160m5/30/23 30 Entresto [id number]  LinCharlene BrookePP notified  VelA[place] [phone]

## 2022-01-11 NOTE — Progress Notes (Signed)
Called patient's son as we still need his proof of income and a coy of his [employer] card. No answer; left message.  Charlene Brooke, CPP notified  Marijean Niemann, [place] Clinical Pharmacy Assistant [phone]

## 2022-01-15 ENCOUNTER — Encounter: Payer: Medicare HMO | Admitting: Student

## 2022-01-15 DIAGNOSIS — I4729 Other ventricular tachycardia: Secondary | ICD-10-CM

## 2022-01-15 DIAGNOSIS — I480 Paroxysmal atrial fibrillation: Secondary | ICD-10-CM

## 2022-01-15 DIAGNOSIS — I5022 Chronic systolic (congestive) heart failure: Secondary | ICD-10-CM

## 2022-01-15 DIAGNOSIS — I10 Essential (primary) hypertension: Secondary | ICD-10-CM

## 2022-01-16 ENCOUNTER — Telehealth: Payer: Medicare HMO

## 2022-01-16 ENCOUNTER — Telehealth: Payer: Self-pay | Admitting: Pharmacist

## 2022-01-16 NOTE — Telephone Encounter (Signed)
  Chronic Care Management   Outreach Note  01/16/2022 Name: Randy [name] MRN: [id number] DOB: 1946-05-03  Referred by: Michela Pitcher, NP  Patient had a phone appointment scheduled with clinical pharmacist today.  An unsuccessful telephone outreach was attempted today. The patient was referred to the pharmacist for assistance with medications, care management and care coordination.   Patient will NOT be penalized in any way for missing a CCM appointment. The no-show fee does not apply.  If possible, a message was left to return call to: [phone] or to [facility].  Charlene Brooke, PharmD, BCACP Clinical Pharmacist [facility] [phone]

## 2022-01-16 NOTE — Telephone Encounter (Signed)
Patient's son has returned phone call.

## 2022-01-16 NOTE — Progress Notes (Deleted)
Chronic Care Management Pharmacy Note  01/16/2022 Name:  Randy [name] MRN:  [id number] DOB:  1946/03/10  Summary: CCM F/U visit -Pt never started Eliquis ([facility], never picked [facility]). Per cardiology notes overall Afib burden is low and pt maintains NSR. -Pt has not filled Entresto since 07/12/21 x 30 ds ([facility]) -Pt confuses pharmacy - [facility] keeps calling him and trying to fill things when [facility] should be filling his packs, leading to confusion and missed medications  Recommendations/Changes made from today's visit: -Coordinate with cardiology for refill of Entresto -Consult with Dr Lovena Le regarding Eliquis continuation, as patient never started it -Warehouse manager Rx and ask to cancel all Rx's/future fills  Plan: -[place] will call [facility] to cancel refills -Pharmacist follow up televisit scheduled for 3 months -PCP F/U 03/16/22    Subjective: Randy [name] is an 76 y.o. year old male who is a primary patient of Dayln, Tugwell, NP.  The CCM team was consulted for assistance with disease management and care coordination needs.    Engaged with patient by telephone for follow up visit in response to provider referral for pharmacy case management and/or care coordination services.   Consent to Services:  The patient was given information about Chronic Care Management services, agreed to services, and gave verbal consent prior to initiation of services.  Please see initial visit note for detailed documentation.   Patient Care Team: Michela Pitcher, NP as PCP - General (Nurse Practitioner) Evans Lance, MD as PCP - Electrophysiology (Cardiology) Evans Lance, MD as Consulting Physician (Cardiology) Marybelle Killings, MD as Consulting Physician (Orthopedic Surgery) [name], LCSW as Social Worker Charlton Haws, [facility] as Pharmacist (Pharmacist)   Recent office visits: 12/01/21 NP Romilda Garret OV: f/u recent lab abnormalities. Recommend  ALF. 11/28/21 NP Romilda Garret OV: f/u- A1c 10, Cr 2.4, GFR 25 significantly worse in s/o of med noncompliance.  06/29/21 NP Matt Charmian Muff OV: chronic f/u; start Donepezil 5 mg daily HS.  Recent consult visits: 12/27/21 Dr Linus Salmons (ID): f/u wound on finger. Healing on exam, not considered infected.  12/13/21 Dr Linus Salmons (ID): finger wound eval - watchful waiting.  07/06/21 PA Joesph July (Cardiology): f/u CHF, NSVT. Start amiodarone 200 mg daily (pt misses AM meds often, wanted amiodarone on board for long 1/2 life)  Hospital visits: None in previous 6 months   Objective:  Lab Results  Component Value Date   CREATININE 1.73 (H) 12/15/2021   BUN 25 (H) 12/15/2021   GFR 37.98 (L) 12/15/2021   GFRNONAA 23 (L) 06/10/2019   GFRAA 26 (L) 06/10/2019   NA 135 12/15/2021   K 4.7 12/15/2021   CALCIUM 9.2 12/15/2021   CO2 26 12/15/2021   GLUCOSE 326 (H) 12/15/2021    Lab Results  Component Value Date/Time   HGBA1C 10.0 (A) 11/28/2021 12:31 PM   HGBA1C 7.9 (A) 06/29/2021 11:47 AM   HGBA1C 8.9 (H) 03/10/2021 10:25 AM   HGBA1C 7.9 (H) 05/30/2020 08:39 AM   HGBA1C 6.4 06/16/2018 09:19 AM   GFR 37.98 (L) 12/15/2021 10:51 AM   GFR 31.54 (L) 12/01/2021 12:17 PM   MICROALBUR 21.7 (H) 12/15/2021 10:51 AM   MICROALBUR 2.0 (H) 08/05/2019 09:36 AM    Last diabetic Eye exam:  Lab Results  Component Value Date/Time   HMDIABEYEEXA No Retinopathy 07/14/2019 12:00 AM    Last diabetic Foot exam: No results found for: "HMDIABFOOTEX"   Lab Results  Component Value  Date   CHOL 266 (H) 11/28/2021   HDL 33.60 (L) 11/28/2021   LDLCALC 109 (H) 06/29/2021   LDLDIRECT 123.0 11/28/2021   TRIG (H) 11/28/2021    714.0 Triglyceride is over 400; calculations on Lipids are invalid.   CHOLHDL 8 11/28/2021       Latest Ref Rng & Units 11/28/2021    1:04 PM 06/29/2021   11:54 AM 03/10/2021   10:25 AM  Hepatic Function  Total Protein 6.0 - 8.3 g/dL 7.2  7.0  7.1   Albumin 3.5 - 5.2 g/dL 4.3  3.9  4.4   AST 0 -  37 U/L _0 ALT 0 - 53 U/L _1 Alk Phosphatase 39 - 117 U/L 80  57  62   Total Bilirubin 0.2 - 1.2 mg/dL 0.4  0.5  0.6     Lab Results  Component Value Date/Time   TSH 1.45 03/10/2021 10:25 AM   TSH 0.53 06/16/2018 09:35 AM       Latest Ref Rng & Units 11/28/2021    1:04 PM 06/29/2021   11:54 AM 05/30/2020    8:39 AM  CBC  WBC 4.0 - 10.5 K/uL 9.1  6.9  7.8   Hemoglobin 13.0 - 17.0 g/dL 12.5  11.4  11.1   Hematocrit 39.0 - 52.0 % 37.2  34.7  33.4   Platelets 150.0 - 400.0 K/uL 205.0  204.0  176.0     Lab Results  Component Value Date/Time   VD25OH 19.27 (L) 11/28/2021 01:04 PM   VD25OH 31.61 03/10/2021 10:25 AM    Clinical ASCVD: Yes  The ASCVD Risk score (Arnett DK, et al., 2019) failed to calculate for the following reasons:   The patient has a prior MI or stroke diagnosis        12/13/2021    3:03 PM 03/10/2021    8:08 AM 11/20/2019    2:57 PM  Depression screen PHQ 2/9  Decreased Interest 0 0 0  Down, Depressed, Hopeless 0 0 0  PHQ - 2 Score 0 0 0  Altered sleeping   0  Tired, decreased energy   0  Change in appetite   0  Feeling bad or failure about yourself    0  Trouble concentrating   0  Moving slowly or fidgety/restless   0  Suicidal thoughts   0  PHQ-9 Score   0  Difficult doing work/chores   Not difficult at all     Social History   Tobacco Use  Smoking Status Former   Packs/day: 2.00   Years: 17.00   Total pack years: 34.00   Types: Cigarettes   Quit date: 07/30/1984   Years since quitting: 37.4  Smokeless Tobacco Never   BP Readings from Last 3 Encounters:  12/27/21 (!) 147/73  12/13/21 126/74  12/01/21 (!) 108/56   Pulse Readings from Last 3 Encounters:  12/27/21 65  12/13/21 66  12/01/21 (!) 59   Wt Readings from Last 3 Encounters:  12/27/21 190 lb (86.2 kg)  12/13/21 190 lb (86.2 kg)  12/01/21 189 lb 2 oz (85.8 kg)   BMI Readings from Last 3 Encounters:  12/27/21 28.89 kg/m  12/13/21 28.89 kg/m  12/01/21 28.76  kg/m    Assessment/Interventions: Review of patient past medical history, allergies, medications, health status, including review of consultants reports, laboratory and other test data, was performed as part of comprehensive evaluation and provision of chronic care management  services.   SDOH:  (Social Determinants of Health) assessments and interventions performed: {yes/no:20286}   SDOH Screenings   Alcohol Screen: Not on file  Depression (PHQ2-9): Low Risk  (12/13/2021)   Depression (PHQ2-9)    PHQ-2 Score: 0  Financial Resource Strain: Low Risk  (10/17/2021)   Overall Financial Resource Strain (CARDIA)    Difficulty of Paying Living Expenses: Not very hard  Food Insecurity: No Food Insecurity (10/17/2021)   Hunger Vital Sign    Worried About Running Out of Food in the Last Year: Never true    Ran Out of Food in the Last Year: Never true  Housing: Low Risk  (07/14/2021)   Housing    Last Housing Risk Score: 0  Physical Activity: Not on file  Social Connections: Not on file  Stress: Not on file  Tobacco Use: Medium Risk (12/27/2021)   Patient History    Smoking Tobacco Use: Former    Smokeless Tobacco Use: Never    Passive Exposure: Not on file  Transportation Needs: No Transportation Needs (05/24/2021)   PRAPARE - Hydrologist (Medical): No    Lack of Transportation (Non-Medical): No    CCM Care Plan  No Known Allergies  Medications Reviewed Today     Reviewed by Thayer Headings, MD (Physician) on 12/27/21 at [place] List Status: <None>   Medication Order Taking? Sig Documenting Provider Last Dose Status Informant  Accu-Chek Softclix Lancets lancets [id number] Yes USE AS DIRECTED  TO TEST BLOOD SUGAR ONE TIME DAILY  AND AS NEEDED Elby Beck, FNP Taking Active   Alcohol Swabs (B-D SINGLE USE SWABS REGULAR) PADS [id number] Yes Use as instructed to clean area for glucose monitoring once daily and as needed.  Diagnosis:  E11.21  Non  insulin-dependent Tonia Ghent, MD Taking Active Self  allopurinol (ZYLOPRIM) 100 MG tablet [id number] Yes Take 0.5 tablets (50 mg total) by mouth daily. Michela Pitcher, NP Taking Active   amiodarone (PACERONE) 200 MG tablet [id number] Yes Take 1 tablet (200 mg total) by mouth daily. Shirley Friar, PA-C Taking Active   apixaban (ELIQUIS) 5 MG TABS tablet [id number] Yes Take 1 tablet (5 mg total) by mouth 2 (two) times daily. Evans Lance, MD Taking Active   atorvastatin (LIPITOR) 80 MG tablet [id number] Yes TAKE 1 TABLET (80 MG TOTAL) DAILY AT 6 PM. Evans Lance, MD Taking Active   Blood Glucose Calibration ([facility]) SOLN [id number] Yes Use to calibrate blood glucose machine as recommended.  Diagnosis:  E11.21  Non-insulin dependent. Tonia Ghent, MD Taking Active Self  Blood Glucose Monitoring Suppl (ACCU-CHEK GUIDE) w/Device KIT [id number] Yes Use to check blood sugar once daily. Elby Beck, FNP Taking Active   carvedilol (COREG) 3.125 MG tablet [id number] Yes Take 1 tablet (3.125 mg total) by mouth 2 (two) times daily. Evans Lance, MD Taking Active   donepezil (ARICEPT) 5 MG tablet [id number] Yes TAKE ONE TABLET BY MOUTH EVERY Elgie Congo, NP Taking Active   ezetimibe (ZETIA) 10 MG tablet [id number] Yes TAKE ONE TABLET BY MOUTH EVERY MORNING Michela Pitcher, NP Taking Active   finasteride (PROSCAR) 5 MG tablet [id number] Yes Take 1 tablet (5 mg total) by mouth daily. Michela Pitcher, NP Taking Active   furosemide (LASIX) 20 MG tablet [id number] Yes TAKE ONE TABLET BY MOUTH EVERY MORNING Michela Pitcher, NP Taking Active   gabapentin (NEURONTIN) 300 MG capsule [id number] Yes TAKE  TWO CAPSULES BY MOUTH TWICE DAILY Michela Pitcher, NP Taking Active   glipiZIDE (GLUCOTROL XL) 10 MG 24 hr tablet [id number] Yes TAKE ONE TABLET BY MOUTH EVERY MORNING Michela Pitcher, NP Taking Active   glucose blood (ACCU-CHEK AVIVA PLUS) test strip [id number] Yes Test Blood Sugar 1 time  daily: Dx:E11.27 Elby Beck, FNP Taking Active   sacubitril-valsartan (ENTRESTO) 24-26 MG [id number] Yes TAKE ONE TABLET BY MOUTH EVERY MORNING and TAKE ONE TABLET BY MOUTH EVERY EVENING Evans Lance, MD Taking Active   tamsulosin [facility]) 0.4 MG CAPS capsule [id number] Yes Take 1 capsule (0.4 mg total) by mouth daily. Michela Pitcher, NP Taking Active             Patient Active Problem List   Diagnosis Date Noted   Decreased renal function 12/01/2021   Open wnd of finger 11/28/2021   Mild cognitive impairment 03/10/2021   Mass of right foot 03/10/2021   Idiopathic gout of multiple sites 10/07/2018   Grief 06/16/2018   Dizziness 06/16/2018   Allergic rhinitis 04/24/2018   S/P CABG x 3 09/02/2017   Leukocytosis 08/27/2017   Visit for well man health check 05/07/2017   COPD (chronic obstructive pulmonary disease) ([place]) 25/95/6387   Chronic systolic CHF (congestive heart failure) ([place]) 10/18/2016   Generalized OA 06/22/2014   Hypotension due to drugs 03/22/2014   CKD (chronic kidney disease) stage 3, GFR 30-59 ml/min (HCC)    Synovial cyst of lumbar spine 07/17/2013    Class: Diagnosis of   Osteoarthritis of left hip 09/17/2012   Automatic implantable cardioverter-defibrillator in situ - St Jude 56/43/3295   Chronic systolic heart failure ([place]) 05/01/2011   IMPOTENCE OF ORGANIC ORIGIN 02/20/2010   Vitamin D deficiency 02/14/2010   Type 2 diabetes with nephropathy ([place]) 01/11/2010   Implantable cardioverter-defibrillator (ICD) in situ 05/10/2009   HLD (hyperlipidemia) 02/24/2008   Essential hypertension 02/24/2008   Non-ST elevation (NSTEMI) myocardial infarction ([place]) 02/24/2008   HYPERSOMNIA UNSPECIFIED 02/24/2008    Immunization History  Administered Date(s) Administered   Fluad Quad(high Dose 65+) 04/28/2019, 05/30/2020   Influenza Split 06/02/2012   Influenza Whole 05/18/2010   Influenza,inj,Quad PF,6+ Mos 04/23/2013, 03/22/2014, 07/07/2015, 06/13/2016,  04/22/2017, 03/31/2018   Influenza-Unspecified 05/07/2019   PFIZER(Purple Top)SARS-COV-2 Vaccination 09/22/2019, 10/13/2019, 12/04/2019, 05/22/2021   Pneumococcal Conjugate-13 06/22/2014   Pneumococcal Polysaccharide-23 03/09/2016   Td 01/14/2008   Zoster Recombinat (Shingrix) 07/09/2019   Zoster, Live 01/11/2010    Conditions to be addressed/monitored:  Hypertension, Hyperlipidemia, Diabetes, Atrial Fibrillation, Heart Failure, Coronary Artery Disease, and Chronic Kidney Disease  There are no care plans that you recently modified to display for this patient.  Medication Assistance:  Entresto - [employer] PAP in progress, pt documents needed.  Compliance/Adherence/Medication fill history: Care Gaps: Colonoscopy (due 02/07/20) Eye exam (due 07/13/20) Foot exam (due 11/19/20, ordered 06/29/21)  Star-Rating Drugs: Atorvastatin - PDC 100% Glipizide - PDC 100%  Medication Access: Within the past 30 days, how often has patient missed a dose of medication? *** Is a pillbox or other method used to improve adherence? {YES/NO:21197} Factors that may affect medication adherence? {CHL DESC; BARRIERS:21522} Are meds synced by current pharmacy? {YES/NO:21197} Are meds delivered by current pharmacy? {YES/NO:21197} Does patient experience delays in picking up medications due to transportation concerns? {YES/NO:21197}  [facility] Reviewed: Is patient disadvantaged to use [facility]?: No  Current Rx insurance plan: [employer] MA Name and location of Current pharmacy:  [facility] - [place], [place] - [place] [address]  [address] [place] [address] Phone: [phone] Fax: [phone]  [facility] services reviewed with patient today?: {YES/NO:21197} 30-day Packs (last delivered 01/03/22) Carvedilol 3.125 mg - 1 BID (breakfast and evening meal) Gabapentin 300 mg - 2 BID (breakfast and evening meal) Furosemide 20 mg - 1 daily  (breakfast) Allopurinol 100 mg - 1/2 tablet daily (breakfast) Tamsulosin 0.46m - 1 daily (evening meal) Finasteride 5 mg - 1 daily (breakfast) Atorvastatin 80 mg - 1 tablet (evening meal) Glipizide ER 10 mg - 1 daily  (breakfast) Zetia 10 mg- 1 tablet daily (breakfast)  Donepezil 569m-1 tablet (evening meal) Amiodarone 20013m 1 tablet (breakfast) Eliquis 5mg38mtake 1 tablet breakfast 1 tablet evening meal    VIAL medications: Entresto 24-26mg37me 1 tablet am 1 tablet pm -[place] Patient Decision:  Patient agrees to Care Plan and Follow-up.  Plan: Telephone follow up appointment with care management team member scheduled for:  3 months  LindsCharlene BrookermD, BCACP Clinical Pharmacist [phone] Barriers:  Unable to independently monitor therapeutic efficacy Unable to self administer medications as prescribed  Pharmacist Clinical Goal(s):  Patient will achieve adherence to monitoring guidelines and medication adherence to achieve therapeutic efficacy achieve ability to self administer medications as prescribed through use of pill packs as evidenced by patient report through collaboration with PharmD and provider.   Interventions: 1:1 collaboration with CableMichela Pitcherregarding development and update of comprehensive plan of care as evidenced by provider attestation and co-signature Inter-disciplinary care team collaboration (see longitudinal plan of care) Comprehensive medication review performed; medication list updated in electronic medical record  Hypertension / NSVT (BP goal <140/90) -Controlled - BP at goal in clinic; per pharmacy refill data, EntreDelene Lollnot been refilled since 07/13/21 per [facility]. Pt should be getting all medications through [facility] to limit confusion. -Last ejection fraction: 25-30% (Date: 07/2017) -HF type: Systolic; NYHA Class: II-III -Current  treatment: Carvedilol 3.125 mg BID - Appropriate, Effective, Safe, Accessible Furosemide 20 mg daily - Appropriate, Effective, Safe, Accessible Entresto 24-26 mg BID - Appropriate, Effective, Safe, Query Accessible (overdue for refill) -Medications previously tried: n/a  -Denies hypotensive/hypertensive symptoms -Educated on BP goals and benefits of medications for prevention of heart attack, stroke and kidney damage; -Counseled to monitor BP at home periodically -Recommended to continue current medication; coordinate with cardiology to refill Entresto via [facility]  Hyperlipidemia: (LDL goal < 70) -Not ideally controlled - LDL 123 (11/2021); above goal given history of CAD; compliance with all medications is an issue, will work with pharmacy (pill packs) and family to improve this as much as possible -Hx NSTEMI -Current treatment: Atorvastatin 80 mg daily - Appropriate, Query Effective Ezetimibe 10 mg daily -Appropriate, Query Effective -Medications previously tried: n/a  -Educated on Cholesterol goals; Benefits of statin for ASCVD risk reduction; -Recommended to continue current medication; encouraged compliance with pill packs  Atrial Fibrillation (Goal: prevent stroke and major bleeding) -Controlled - pt resumed Eliquis in March 2023 after being off of it for a year due to confusion at pharmacy; *** -CHADSVASC: 6  -Current treatment: Amiodarone 200 mg daily - Appropriate, Effective, Safe, Accessible Carvedilol 3.125 mg BID - Appropriate, Effective, Safe, Accessible Eliquis 5 mg BID - Appropriate, Effective, Safe, Accessible -Medications previously tried: n/a -Counseled on increased risk of stroke due to Afib and benefits of anticoagulation for stroke prevention; -Coordinate  with cardiology re: continuation of Eliquis; requested refill to [facility] if it is to be continued  Diabetes (A1c goal <8%) -Controlled - A1c 10.0 (11/2021) above goal in s/o of medication noncompliance; more  conservative A1c goal chosen due to age, multiple comorbidities and impaired cognition -Current medications: Glipizide XL 10 mg daily AM - Appropriate, Query Effective -Medications previously tried: metformin  -Educated on A1c and blood sugar goals; -Counseled to check feet daily and get yearly eye exams -Recommended to continue current medication  Cognitive impairment (Goal: slow progression) -Controlled - pt recently started donepezil, it has been added to pill packs  -Current treatment  Donepezil 5 mg daily HS - Appropriate, Effective, Safe, Accessible -Medications previously tried: n/a  -Recommended to continue current medication  Health Maintenance -Overall questionable medication adherence even with pill packs due to memory issues; per son, patient often forgets to take the packs even though the box is placed on his side table near his chair in living room -Encouraged son to monitor medication adherence as able  Patient Goals/Self-Care Activities Patient will:  - take medications as prescribed as evidenced by patient report and record review focus on medication adherence by pill packs

## 2022-01-17 NOTE — Progress Notes (Signed)
Patient's son did not have the information we needed. He stated when he goes to his Dad's house he will get the information and give me a call back.  Charlene Brooke, CPP notified  Marijean Niemann, [place] Clinical Pharmacy Assistant [phone]

## 2022-01-17 NOTE — Progress Notes (Signed)
Remote ICD transmission.   

## 2022-01-18 NOTE — Progress Notes (Signed)
Tried calling patient's son; no answer; left message.  Al Corpus, CPP notified  Claudina Lick, [place] Clinical Pharmacy Assistant [phone]

## 2022-01-19 ENCOUNTER — Other Ambulatory Visit: Payer: Self-pay | Admitting: Nurse Practitioner

## 2022-01-19 ENCOUNTER — Other Ambulatory Visit: Payer: Self-pay

## 2022-01-19 ENCOUNTER — Ambulatory Visit (INDEPENDENT_AMBULATORY_CARE_PROVIDER_SITE_OTHER): Payer: Medicare HMO | Admitting: *Deleted

## 2022-01-19 DIAGNOSIS — E782 Mixed hyperlipidemia: Secondary | ICD-10-CM

## 2022-01-19 DIAGNOSIS — G3184 Mild cognitive impairment, so stated: Secondary | ICD-10-CM

## 2022-01-19 DIAGNOSIS — E790 Hyperuricemia without signs of inflammatory arthritis and tophaceous disease: Secondary | ICD-10-CM

## 2022-01-19 DIAGNOSIS — E1121 Type 2 diabetes mellitus with diabetic nephropathy: Secondary | ICD-10-CM

## 2022-01-19 NOTE — Telephone Encounter (Signed)
Refill request from [facility] Has Follow up on 03/16/22

## 2022-01-22 ENCOUNTER — Telehealth: Payer: Self-pay

## 2022-01-22 MED ORDER — ALLOPURINOL 100 MG PO TABS: 50.0000 mg | ORAL_TABLET | Freq: Every day | ORAL | 2 refills | Status: DC

## 2022-01-25 ENCOUNTER — Telehealth: Payer: Self-pay

## 2022-01-25 NOTE — Telephone Encounter (Signed)
Alert remote reviewed. Normal device function.   Known PAF, on OAC, AF burden is 7% of the time. There were two NSVT and three VT episodes that were successfully converted after one burst of ATP.  The patient was in an atrial arrhythmia with the VT.  Sent to triage. Next remote 02/05/2022. Kathy Breach, RN, CCDS, [employer]  Unsuccessful telephone encounter to patient to assess for s/s AF with RVR at only given number. [employer] compliant VM message left requesting call back. Of note patient has been a "no show" x 2 with [name], PA on 11/22/21 and 01/15/22. Anticipate need for rate control and/or possible VT zone adjustments. Meds on file include amio, eliquis, coreg  Routing to Dr. Lovena Le and [name], PA as Juluis Rainier. Reviewed with Dr. Quentin Ore who agrees inappropriate therapy.

## 2022-01-25 NOTE — Telephone Encounter (Signed)
LM for pt to call back.

## 2022-01-26 ENCOUNTER — Telehealth: Payer: Self-pay | Admitting: Internal Medicine

## 2022-01-26 NOTE — Telephone Encounter (Signed)
[name] returning nurses call. Please advise

## 2022-01-26 NOTE — Telephone Encounter (Signed)
LVM at all available number for patient to call device clinic back, of note chronic care management has made multiple unsuccessful attempts to reach patient as well, will send letter.

## 2022-01-26 NOTE — Telephone Encounter (Signed)
Spoke with patients son, patients son stated he takes patient to all appointments and patient has memory loss, appointment scheduled with Tommye Standard on 01/09/2022 at 10:55am

## 2022-02-05 ENCOUNTER — Ambulatory Visit (INDEPENDENT_AMBULATORY_CARE_PROVIDER_SITE_OTHER): Payer: Medicare PPO

## 2022-02-05 DIAGNOSIS — Z95810 Presence of automatic (implantable) cardiac defibrillator: Secondary | ICD-10-CM | POA: Diagnosis not present

## 2022-02-05 DIAGNOSIS — I5022 Chronic systolic (congestive) heart failure: Secondary | ICD-10-CM | POA: Diagnosis not present

## 2022-02-05 NOTE — Progress Notes (Addendum)
Cardiology Office Note Date:  02/08/2022  Patient ID:  Randy [name], DOB 25-May-1946, MRN [id number] PCP:  Randy Pitcher, NP  Electrophysiologist: Dr. Lovena Le    Chief Complaint: ICD therapies  History of Present Illness: Randy [name] is a 76 y.o. male with history of ICM, chronic CHF (systolic), VT, AFib, CAD (CABG 2019), CKD (III), HTN, HLD, DM  He comes in today to be seen for Dr. Lovena Le, last seen by him June 2021, no VT/maintaining SR off amiodarone, no changes were made with class II symptoms  He saw [name] Dec 2022 he had been shocked by his ICD several weeks prior, no clear syncope or symptoms, amiodarone was resumed, his son reported he often missed his AM meds.  NO-showed to 2 scheduled f/u visits  Device remote ATPs felt to be inappropriate in review with EP in the office, for rapid AFlutter, called to come in.    TODAY He comes accompanied by his son. The pat resides independently, is known to have dome degree of dementia, no longer drives/goes out alone, admittedly he agrees with his son, would likely get lost. He feels like for the most part he gets his AM meds taken perhaps not 100%, but more often then not, his PM/afternoon medicines perhaps infrequent at leas, perhaps rarely, says he just forgets, it is not an intentional non-compliance with medicines. His son says that when they find un-taken pills, and the pt can not recall if he took others or not and worry about over dosing pills, so they go ahead and skip.  The patient denies any symptoms As far as the don knows, he has never complained to him about anything/any symptoms No reports of falls or faints No CP, SOB He does not exercise, lives a pretty sedentary life, but denies symptoms or difficulties with ADLs    Device information Abbott single chamber ICD implanted 05/20/2008 >> gen change and addition of RA lead 10/18/2016 + appropriate therapy  Amiodarone stopped 2021 after had run out and been  off come months it seem (unclear) >> restarted Dec 2022 with VT treated w/HV tx.   Past Medical History:  Diagnosis Date   AICD (automatic cardioverter/defibrillator) present 10/18/2016   "replaced 2009-[employer] device, Dr Lovena Le"   Anemia    Arthritis    "back" (08/27/2017)   CAD (coronary artery disease)    CHF (congestive heart failure) (HCC)    CKD (chronic kidney disease) stage 3, GFR 30-59 ml/min (HCC)    [name]   Diverticulosis    Dyslipidemia    GERD (gastroesophageal reflux disease)    Hypertension    Neuromuscular disorder (HCC)    neuropathy- in hands   Osteoarthritis    hips, hands   Pacemaker    Shortness of breath    Sleep apnea    started a sleep study but didn't completed, states PCP told him he has sleep  apnea, doesn't use CPAP   Systolic dysfunction    Type 2 diabetes with nephropathy (HCC)    Vitamin D deficiency     Past Surgical History:  Procedure Laterality Date   ANTERIOR CERVICAL DECOMP/DISCECTOMY FUSION     BACK SURGERY     CARPAL TUNNEL RELEASE Left    CORONARY ARTERY BYPASS GRAFT N/A 09/02/2017   Procedure: CORONARY ARTERY BYPASS GRAFTING (CABG) x three , using left internal mammary artery and right leg greater saphenous vein harvested endoscopically;  Surgeon: [name], MD;  Location: [facility];  Service:  Open Heart Surgery;  Laterality: N/A;   CYST EXCISION     "back"   ICD IMPLANT N/A 10/18/2016   Procedure: ICD Implant- Upgrade to Dual Chamber;  Surgeon: Evans Lance, MD;  Location: [facility];  Service: Cardiovascular;  Laterality: N/A;   INSERT / REPLACE / REMOVE PACEMAKER     JOINT REPLACEMENT     LEFT HEART CATH AND CORONARY ANGIOGRAPHY N/A 08/30/2017   Procedure: LEFT HEART CATH AND CORONARY ANGIOGRAPHY;  Surgeon: Martinique, Peter M, MD;  Location: [facility];  Service: Cardiovascular;  Laterality: N/A;   LUMBAR LAMINECTOMY Left 07/17/2013   Procedure: MICRODISCECTOMY LUMBAR LAMINECTOMY;  Surgeon: Marybelle Killings, MD;   Location: [facility];  Service: Orthopedics;  Laterality: Left;  Left L4 Laminotomy, Lateral Recess Decompression, Cyst Excision   SHOULDER OPEN ROTATOR CUFF REPAIR Left    TEE WITHOUT CARDIOVERSION N/A 09/02/2017   Procedure: TRANSESOPHAGEAL ECHOCARDIOGRAM (TEE);  Surgeon: [name], MD;  Location: [facility];  Service: Open Heart Surgery;  Laterality: N/A;   TOTAL HIP ARTHROPLASTY Left 09/17/2012   Procedure: Left TOTAL HIP ARTHROPLASTY ANTERIOR APPROACH;  Surgeon: Marybelle Killings, MD;  Location: [facility];  Service: Orthopedics;  Laterality: Left;    Current Outpatient Medications  Medication Sig Dispense Refill   Accu-Chek Softclix Lancets lancets USE AS DIRECTED  TO TEST BLOOD SUGAR ONE TIME DAILY  AND AS NEEDED 200 each 2   Alcohol Swabs (B-D SINGLE USE SWABS REGULAR) PADS Use as instructed to clean area for glucose monitoring once daily and as needed.  Diagnosis:  E11.21  Non insulin-dependent 100 each 3   allopurinol (ZYLOPRIM) 100 MG tablet Take 0.5 tablets (50 mg total) by mouth daily. 15 tablet 2   amiodarone (PACERONE) 200 MG tablet Take 1 tablet (200 mg total) by mouth daily. 90 tablet 3   apixaban (ELIQUIS) 5 MG TABS tablet Take 1 tablet (5 mg total) by mouth 2 (two) times daily. 60 tablet 5   atorvastatin (LIPITOR) 80 MG tablet TAKE 1 TABLET (80 MG TOTAL) DAILY AT 6 PM. 90 tablet 3   Blood Glucose Calibration (ACCU-CHEK AVIVA) SOLN Use to calibrate blood glucose machine as recommended.  Diagnosis:  E11.21  Non-insulin dependent. 1 each 2   Blood Glucose Monitoring Suppl (ACCU-CHEK GUIDE) w/Device KIT Use to check blood sugar once daily. 1 kit 0   carvedilol (COREG) 3.125 MG tablet Take 1 tablet (3.125 mg total) by mouth 2 (two) times daily. 60 tablet 3   donepezil (ARICEPT) 5 MG tablet TAKE ONE TABLET BY MOUTH EVERY EVENING 90 tablet 1   ezetimibe (ZETIA) 10 MG tablet TAKE ONE TABLET BY MOUTH EVERY MORNING 90 tablet 2   finasteride (PROSCAR) 5 MG tablet Take 1 tablet (5 mg total) by mouth  daily. 30 tablet 5   furosemide (LASIX) 20 MG tablet TAKE ONE TABLET BY MOUTH EVERY MORNING 30 tablet 2   gabapentin (NEURONTIN) 300 MG capsule TAKE TWO CAPSULES BY MOUTH TWICE DAILY 120 capsule 2   glipiZIDE (GLUCOTROL XL) 10 MG 24 hr tablet TAKE ONE TABLET BY MOUTH EVERY MORNING 90 tablet 1   glucose blood (ACCU-CHEK AVIVA PLUS) test strip Test Blood Sugar 1 time daily: Dx:E11.27 100 each 3   sacubitril-valsartan (ENTRESTO) 24-26 MG TAKE ONE TABLET BY MOUTH EVERY MORNING and TAKE ONE TABLET BY MOUTH EVERY EVENING 60 tablet 0   tamsulosin (FLOMAX) 0.4 MG CAPS capsule Take 1 capsule (0.4 mg total) by mouth daily. 30 capsule 5  No current facility-administered medications for this visit.    Allergies:   Patient has no known allergies.   Social History:  The patient  reports that he quit smoking about 37 years ago. His smoking use included cigarettes. He has a 34.00 pack-year smoking history. He has never used smokeless tobacco. He reports current alcohol use. He reports that he does not use drugs.   Family History:  The patient's family history includes Diabetes in his brother; Heart attack (age of onset: 35) in his father.  ROS:  Please see the history of present illness.    All other systems are reviewed and otherwise negative.   PHYSICAL EXAM:  VS:  BP (!) 100/58   Pulse 71   Ht '5\' 9"'  (1.753 m)   Wt 186 lb (84.4 kg)   SpO2 97%   BMI 27.47 kg/m  BMI: Body mass index is 27.47 kg/m. Well nourished, well developed, in no acute distress, has an unkept appearance HEENT: normocephalic, atraumatic Neck: no JVD, carotid bruits or masses Cardiac:  irreg-irreg; 1-2/6SM, no rubs, or gallops Lungs:   CTA b/l, no wheezing, rhonchi or rales Abd: soft, nontender MS: no deformity or atrophy Ext:  no edema Skin: warm and dry, no rash Neuro:  No gross deficits appreciated Psych: euthymic mood, full affect  ICD site is stable, no tethering or discomfort   EKG:  not done today  Device  interrogation done today and reviewed by myself:  Battery and lead measurements are good He is in an ongoing AFlutter that started this AM Burden 6.7% No new treated episodes Morphology of the previously noted events are c/w RVR, not VT   08/30/2017: LHC Prox RCA lesion is 90% stenosed. Mid RCA lesion is 100% stenosed. Ost LM to Mid LM lesion is 85% stenosed. Ost LAD to Prox LAD lesion is 50% stenosed. Ost 1st Diag to 1st Diag lesion is 70% stenosed. Ost Ramus to Ramus lesion is 75% stenosed. Ost Cx to Prox Cx lesion is 99% stenosed. Ost 1st Mrg lesion is 100% stenosed. LV end diastolic pressure is normal.   1. Severe 3 vessel and left main obstructive CAD 2. Normal LVEDP   08/27/2017: TTE Study Conclusions  - Left ventricle: The cavity size was normal. Systolic function was    severely reduced. The estimated ejection fraction was in the    range of 25% to 30%. Diffuse hypokinesis. Features are consistent    with a pseudonormal left ventricular filling pattern, with    concomitant abnormal relaxation and increased filling pressure    (grade 2 diastolic dysfunction). Doppler parameters are    consistent with high ventricular filling pressure.  - Aortic valve: Transvalvular velocity was within the normal range.    There was no stenosis. There was no regurgitation.  - Mitral valve: Transvalvular velocity was within the normal range.    There was no evidence for stenosis. There was trivial    regurgitation.  - Left atrium: The atrium was mildly dilated.  - Right ventricle: The cavity size was normal. Wall thickness was    normal. Systolic function was normal.  - Tricuspid valve: There was trivial regurgitation.  - Pulmonary arteries: Systolic pressure was within the normal    range. PA peak pressure: 18 mm Hg (S).    02/09/2008: TTE SUMMARY   -  The left ventricle was mildly dilated. Overall left ventricular         systolic function was mildly decreased. Left ventricular  ejection fraction was estimated to be 40 %. There was         akinesis of the entire inferoseptal wall. There was akinesis         of the entire inferior wall. There was akinesis of the         basal-mid posterior wall.   -  The aortic valve was mildly calcified. There was mildly reduced         aortic valve leaflet excursion.   Recent Labs: 03/10/2021: TSH 1.45 11/28/2021: ALT 9; Hemoglobin 12.5; Platelets 205.0 12/15/2021: BUN 25; Creatinine, Ser 1.73; Potassium 4.7; Sodium 135  06/29/2021: LDL Cholesterol 109; VLDL 37.6 11/28/2021: Cholesterol 266; Direct LDL 123.0; HDL 33.60; Total CHOL/HDL Ratio 8; Triglycerides 714.0 Triglyceride is over 400; calculations on Lipids are invalid.   CrCl cannot be calculated (Patient's most recent lab result is older than the maximum 21 days allowed.).   Wt Readings from Last 3 Encounters:  02/08/22 186 lb (84.4 kg)  12/27/21 190 lb (86.2 kg)  12/13/21 190 lb (86.2 kg)     Other studies reviewed: Additional studies/records reviewed today include: summarized above  ASSESSMENT AND PLAN:  ICD Intact function, no programming changes made  VT Resumed on amiodarone Dec 2022 None noted  Paroxysmal Afib, AFlutter CHA2DS2Vasc is 6, on Eliquis, appropriately dosed 6.7 % burden I don't think he is taking Eliquis BID, we may need to change to xarelto if pt/family can not get a good BID regime going  CAD No reported anginal symptoms He should get on board with general cardiology Son reports his PMD does his labs, and manages lipids  ICM Chronic CHF (systolic) No exam findings of volume OL CorVue looks OK  HTN Lowish today Unclear on meds follow  Medication non-compliance Discussed/urged more help with medication compliance He may ultimately need perhaps ALF/formal help Discussed strategies to try and make reminders to take his medicines They are uncertain if he is taking amiodarone at all. His son will try to get a system in place at  home with the patient for his meds  I have asked our MA to reach out to the son later today/tomorrow, he will go through his dad's meds and make sure we have an accurate list.  ADDEND:  [name] confirmed that amiodarone is indeed in his pill pack Tommye Standard, PA-C 02/09/22   Disposition: F/u with me in a month, sooner if needed, will plan to pursue general cards then for him.  Current medicines are reviewed at length with the patient today.  The patient did not have any concerns regarding medicines.  Venetia Night, PA-C 02/08/2022 12:28 PM     [place] [address] [phone] (office)  [phone] (fax)

## 2022-02-08 ENCOUNTER — Encounter: Payer: Self-pay | Admitting: Physician Assistant

## 2022-02-08 ENCOUNTER — Ambulatory Visit: Payer: Medicare PPO | Admitting: Physician Assistant

## 2022-02-08 VITALS — BP 100/58 | HR 71 | Ht 69.0 in | Wt 186.0 lb

## 2022-02-08 DIAGNOSIS — Z95810 Presence of automatic (implantable) cardiac defibrillator: Secondary | ICD-10-CM

## 2022-02-08 DIAGNOSIS — I255 Ischemic cardiomyopathy: Secondary | ICD-10-CM

## 2022-02-08 DIAGNOSIS — I472 Ventricular tachycardia, unspecified: Secondary | ICD-10-CM

## 2022-02-08 DIAGNOSIS — I2510 Atherosclerotic heart disease of native coronary artery without angina pectoris: Secondary | ICD-10-CM

## 2022-02-08 DIAGNOSIS — I5022 Chronic systolic (congestive) heart failure: Secondary | ICD-10-CM | POA: Diagnosis not present

## 2022-02-08 DIAGNOSIS — I480 Paroxysmal atrial fibrillation: Secondary | ICD-10-CM

## 2022-02-08 DIAGNOSIS — I4892 Unspecified atrial flutter: Secondary | ICD-10-CM

## 2022-02-08 LAB — CUP PACEART INCLINIC DEVICE CHECK
Battery Remaining Longevity: 49 mo
Brady Statistic RA Percent Paced: 0.67 %
Brady Statistic RV Percent Paced: 1.8 %
Date Time Interrogation Session: 20230713124338
HighPow Impedance: 75.375
Implantable Lead Implant Date: 20091022
Implantable Lead Implant Date: 20180322
Implantable Lead Location: 753859
Implantable Lead Location: 753860
Implantable Lead Model: 7121
Implantable Pulse Generator Implant Date: 20180322
Lead Channel Impedance Value: 1775 Ohm
Lead Channel Impedance Value: 300 Ohm
Lead Channel Pacing Threshold Amplitude: 1 V
Lead Channel Pacing Threshold Amplitude: 1.25 V
Lead Channel Pacing Threshold Amplitude: 1.25 V
Lead Channel Pacing Threshold Pulse Width: 0.5 ms
Lead Channel Pacing Threshold Pulse Width: 0.8 ms
Lead Channel Pacing Threshold Pulse Width: 0.8 ms
Lead Channel Sensing Intrinsic Amplitude: 1 mV
Lead Channel Sensing Intrinsic Amplitude: 12 mV
Lead Channel Setting Pacing Amplitude: 2 V
Lead Channel Setting Pacing Amplitude: 3 V
Lead Channel Setting Pacing Pulse Width: 0.8 ms
Lead Channel Setting Sensing Sensitivity: 0.5 mV
Pulse Gen Serial Number: 7413245

## 2022-02-08 NOTE — Progress Notes (Signed)
EPIC Encounter for ICM Monitoring  Patient Name: Randy [name] is a 76 y.o. male Date: 02/08/2022 Primary Care Physican: Michela Pitcher, NP Primary Cardiologist: Lovena Le Electrophysiologist: Lovena Le 12/27/2021 Office Weight: 190 lbs   AT/AF Burden: 6.7%      Transmission reviewed.    Corvue thoracic impedance suggesting normal fluid levels.     Prescribed: Furosemide 20 mg 1 tablet daily   Labs: 06/29/2021 Creatinine 1.66, BUN , Potassium 3.8, Sodium 137, GFR 40.04 03/10/2021 Creatinine 1.86, BUN 32, Potassium 3.9, Sodium 138, GFR 35 A complete set of results can be found in Results Review.   Recommendations: No changes.    Follow-up plan: ICM clinic phone appointment on 03/12/2022.  91 day device clinic remote transmission scheduled for 04/03/2022.    EP/Cardiology Office Visits:  02/08/2022 with Tommye Standard, PA.     Copy of ICM check sent to Dr. Lovena Le.    3 month ICM trend: 02/05/2022.    12-14 Month ICM trend:     Rosalene Billings, RN 02/08/2022 12:50 PM

## 2022-02-08 NOTE — Patient Instructions (Signed)
Medication Instructions:  Your physician recommends that you continue on your current medications as directed. Please refer to the Current Medication list given to you today.  *If you need a refill on your cardiac medications before your next appointment, please call your pharmacy*   Lab Work: None If you have labs (blood work) drawn today and your tests are completely normal, you will receive your results only by: [place] (if you have MyChart) OR A paper copy in the mail If you have any lab test that is abnormal or we need to change your treatment, we will call you to review the results.   Follow-Up: At [facility], you and your health needs are our priority.  As part of our continuing mission to provide you with exceptional heart care, we have created designated Provider Care Teams.  These Care Teams include your primary Cardiologist (physician) and Advanced Practice Providers (APPs -  Physician Assistants and Nurse Practitioners) who all work together to provide you with the care you need, when you need it.  We recommend signing up for the patient portal called "MyChart".  Sign up information is provided on this After Visit Summary.  MyChart is used to connect with patients for Virtual Visits (Telemedicine).  Patients are able to view lab/test results, encounter notes, upcoming appointments, etc.  Non-urgent messages can be sent to your provider as well.   To learn more about what you can do with MyChart, go to [email or web address].    Your next appointment:   03/20/22

## 2022-02-12 ENCOUNTER — Telehealth: Payer: Medicare HMO

## 2022-02-13 ENCOUNTER — Telehealth: Payer: Self-pay | Admitting: *Deleted

## 2022-02-13 NOTE — Telephone Encounter (Signed)
  Care Management   Follow Up Note   02/13/2022 Name: Randy [name] MRN: [id number] DOB: [birth year]   Referred by: Michela Pitcher, NP Reason for referral : No chief complaint on file.   An unsuccessful telephone outreach was attempted today. The patient was referred to the case management team for assistance with care management and care coordination.   Follow Up Plan: The care management team will reach out to the patient again to reschedule.   Eduard Clos MSW, LCSW Licensed Clinical Social Worker [place]   [phone]

## 2022-02-20 ENCOUNTER — Other Ambulatory Visit: Payer: Self-pay | Admitting: Nurse Practitioner

## 2022-02-21 ENCOUNTER — Telehealth: Payer: Self-pay

## 2022-02-21 NOTE — Chronic Care Management (AMB) (Signed)
Chronic Care Management Pharmacy Assistant   Name: Randy [name]  MRN: [id number] DOB: 07/23/1946   Reason for Encounter: Medication Adherence and Delivery Coordination  Recent office visits:  01/19/22- LCSW-Telemedicine-Family considering long term care for the patient.  Recent consult visits:  02/08/22-Randy Ursuy,RN(cardio)-f/u heart disease,pacemaker check,consider changing to xarelto,no medication changes, f/u 1 month  Hospital visits:  None in previous 6 months  Medications: Outpatient Encounter Medications as of 02/21/2022  Medication Sig   Accu-Chek Softclix Lancets lancets USE AS DIRECTED  TO TEST BLOOD SUGAR ONE TIME DAILY  AND AS NEEDED   Alcohol Swabs (B-D SINGLE USE SWABS REGULAR) PADS Use as instructed to clean area for glucose monitoring once daily and as needed.  Diagnosis:  E11.21  Non insulin-dependent   allopurinol (ZYLOPRIM) 100 MG tablet Take 0.5 tablets (50 mg total) by mouth daily.   amiodarone (PACERONE) 200 MG tablet Take 1 tablet (200 mg total) by mouth daily.   apixaban (ELIQUIS) 5 MG TABS tablet Take 1 tablet (5 mg total) by mouth 2 (two) times daily.   atorvastatin (LIPITOR) 80 MG tablet TAKE 1 TABLET (80 MG TOTAL) DAILY AT 6 PM.   Blood Glucose Calibration (ACCU-CHEK AVIVA) SOLN Use to calibrate blood glucose machine as recommended.  Diagnosis:  E11.21  Non-insulin dependent.   Blood Glucose Monitoring Suppl (ACCU-CHEK GUIDE) w/Device KIT Use to check blood sugar once daily.   carvedilol (COREG) 3.125 MG tablet Take 1 tablet (3.125 mg total) by mouth 2 (two) times daily.   donepezil (ARICEPT) 5 MG tablet TAKE ONE TABLET BY MOUTH EVERY EVENING   ezetimibe (ZETIA) 10 MG tablet TAKE ONE TABLET BY MOUTH EVERY MORNING   finasteride (PROSCAR) 5 MG tablet TAKE ONE TABLET BY MOUTH EVERY MORNING   furosemide (LASIX) 20 MG tablet TAKE ONE TABLET BY MOUTH EVERY MORNING   gabapentin (NEURONTIN) 300 MG capsule TAKE TWO CAPSULES BY MOUTH TWICE DAILY   glipiZIDE  (GLUCOTROL XL) 10 MG 24 hr tablet TAKE ONE TABLET BY MOUTH EVERY MORNING   glucose blood (ACCU-CHEK AVIVA PLUS) test strip Test Blood Sugar 1 time daily: Dx:E11.27   sacubitril-valsartan (ENTRESTO) 24-26 MG TAKE ONE TABLET BY MOUTH EVERY MORNING and TAKE ONE TABLET BY MOUTH EVERY EVENING   tamsulosin (FLOMAX) 0.4 MG CAPS capsule TAKE ONE CAPSULE BY MOUTH EVERY EVENING   No facility-administered encounter medications on file as of 02/21/2022.   BP Readings from Last 3 Encounters:  02/08/22 (!) 100/58  12/27/21 (!) 147/73  12/13/21 126/74    Lab Results  Component Value Date   HGBA1C 10.0 (A) 11/28/2021      Recent OV, Consult or Hospital visit:  No medication changes indicated   Last adherence delivery date:02/01/22      Patient is due for next adherence delivery on: 03/05/22  Spoke with patient on 02/21/22 reviewed medications and coordinated delivery.spoke with son Randy [name]  This delivery to include: Adherence Packaging  30 Days  Packs: Carvedilol 3.125 mg - 1 BID (breakfast and evening meal) Gabapentin 300 mg - 2 BID (breakfast and evening meal) Furosemide 20 mg - 1 daily (breakfast) Allopurinol 100 mg - 1/2 tablet daily (breakfast) Tamsulosin 0.43m - 1 daily (evening meal) Finasteride 5 mg - 1 daily (breakfast) Atorvastatin 80 mg - 1 tablet (evening meal) Glipizide ER 10 mg - 1 daily  (breakfast) Zetia 10 mg- 1 tablet daily (breakfast)  Donepezil 521m-1 tablet (evening meal) Amiodarone 20059m 1 tablet (breakfast) Eliquis 5mg18mtake 1 tablet breakfast  1 tablet evening meal   VIAL medications: none   Patient declined the following medications this month: none   Any concerns about your medications? No  How often do you forget or accidentally miss a dose? Once a week - The patient has trouble remembering to take the evening medications.  Do you use a pillbox? No  Is patient in packaging Yes  What is the date on your next pill pack?Not at the house to read pack   Any  concerns or issues with your packaging? No concerns    Refills requested from providers include: Tamsulosin,finasteride,  Confirmed delivery date of 03/05/22, advised patient that pharmacy will contact them the morning of delivery.  Recent blood pressure readings are as follows:none available    Recent blood glucose readings are as follows:none available    Annual wellness visit in last year? No Most Recent BP reading:100/58  71-P  02/08/22  If Diabetic:   Most recent A1C reading:10.0   11/28/21 Last eye exam / retinopathy screening:2020 Last diabetic foot exam:UTD  Cycle dispensing form sent to Margaretmary Dys, PTM for review.  Charlene Brooke, CPP notified  [name], [place]  [phone]

## 2022-02-23 ENCOUNTER — Ambulatory Visit: Payer: Medicare HMO | Admitting: *Deleted

## 2022-02-26 NOTE — Chronic Care Management (AMB) (Signed)
Chronic Care Management    Clinical Social Work Note  02/26/2022 Name: Randy [name] MRN: [id number] DOB: [birth year]  Randy [name] is a 76 y.o. year old male who is a primary care patient of Emeric, Novinger, NP. The CCM team was consulted to assist the patient with chronic disease management and/or care coordination needs related to: [employer]  and Level of Care Concerns.   Engaged with patient by telephone for follow up visit in response to provider referral for social work chronic care management and care coordination services.   Consent to Services:  The patient was given information about Chronic Care Management services, agreed to services, and gave verbal consent prior to initiation of services.  Please see initial visit note for detailed documentation.   Patient agreed to services and consent obtained.   Assessment: Review of patient past medical history, allergies, medications, and health status, including review of relevant consultants reports was performed today as part of a comprehensive evaluation and provision of chronic care management and care coordination services.     SDOH (Social Determinants of Health) assessments and interventions performed:    Advanced Directives Status: Not addressed in this encounter.  CCM Care Plan  No Known Allergies  Outpatient Encounter Medications as of 02/23/2022  Medication Sig   Accu-Chek Softclix Lancets lancets USE AS DIRECTED  TO TEST BLOOD SUGAR ONE TIME DAILY  AND AS NEEDED   Alcohol Swabs (B-D SINGLE USE SWABS REGULAR) PADS Use as instructed to clean area for glucose monitoring once daily and as needed.  Diagnosis:  E11.21  Non insulin-dependent   allopurinol (ZYLOPRIM) 100 MG tablet Take 0.5 tablets (50 mg total) by mouth daily.   amiodarone (PACERONE) 200 MG tablet Take 1 tablet (200 mg total) by mouth daily.   apixaban (ELIQUIS) 5 MG TABS tablet Take 1 tablet (5 mg total) by mouth 2 (two) times daily.   atorvastatin  (LIPITOR) 80 MG tablet TAKE 1 TABLET (80 MG TOTAL) DAILY AT 6 PM.   Blood Glucose Calibration (ACCU-CHEK AVIVA) SOLN Use to calibrate blood glucose machine as recommended.  Diagnosis:  E11.21  Non-insulin dependent.   Blood Glucose Monitoring Suppl (ACCU-CHEK GUIDE) w/Device KIT Use to check blood sugar once daily.   carvedilol (COREG) 3.125 MG tablet Take 1 tablet (3.125 mg total) by mouth 2 (two) times daily.   donepezil (ARICEPT) 5 MG tablet TAKE ONE TABLET BY MOUTH EVERY EVENING   ezetimibe (ZETIA) 10 MG tablet TAKE ONE TABLET BY MOUTH EVERY MORNING   finasteride (PROSCAR) 5 MG tablet TAKE ONE TABLET BY MOUTH EVERY MORNING   furosemide (LASIX) 20 MG tablet TAKE ONE TABLET BY MOUTH EVERY MORNING   gabapentin (NEURONTIN) 300 MG capsule TAKE TWO CAPSULES BY MOUTH TWICE DAILY   glipiZIDE (GLUCOTROL XL) 10 MG 24 hr tablet TAKE ONE TABLET BY MOUTH EVERY MORNING   glucose blood (ACCU-CHEK AVIVA PLUS) test strip Test Blood Sugar 1 time daily: Dx:E11.27   sacubitril-valsartan (ENTRESTO) 24-26 MG TAKE ONE TABLET BY MOUTH EVERY MORNING and TAKE ONE TABLET BY MOUTH EVERY EVENING   tamsulosin (FLOMAX) 0.4 MG CAPS capsule TAKE ONE CAPSULE BY MOUTH EVERY EVENING   No facility-administered encounter medications on file as of 02/23/2022.    Patient Active Problem List   Diagnosis Date Noted   Decreased renal function 12/01/2021   Open wnd of finger 11/28/2021   Mild cognitive impairment 03/10/2021   Mass of right foot 03/10/2021   Idiopathic gout of multiple sites 10/07/2018  Grief 06/16/2018   Dizziness 06/16/2018   Allergic rhinitis 04/24/2018   S/P CABG x 3 09/02/2017   Leukocytosis 08/27/2017   Visit for well man health check 05/07/2017   COPD (chronic obstructive pulmonary disease) ([place]) 28/20/6015   Chronic systolic CHF (congestive heart failure) ([place]) 10/18/2016   Generalized OA 06/22/2014   Hypotension due to drugs 03/22/2014   CKD (chronic kidney disease) stage 3, GFR 30-59 ml/min (HCC)     Synovial cyst of lumbar spine 07/17/2013    Class: Diagnosis of   Osteoarthritis of left hip 09/17/2012   Automatic implantable cardioverter-defibrillator in situ - St Jude 61/53/7943   Chronic systolic heart failure ([place]) 05/01/2011   IMPOTENCE OF ORGANIC ORIGIN 02/20/2010   Vitamin D deficiency 02/14/2010   Type 2 diabetes with nephropathy ([place]) 01/11/2010   Implantable cardioverter-defibrillator (ICD) in situ 05/10/2009   HLD (hyperlipidemia) 02/24/2008   Essential hypertension 02/24/2008   Non-ST elevation (NSTEMI) myocardial infarction ([place]) 02/24/2008   HYPERSOMNIA UNSPECIFIED 02/24/2008    Conditions to be addressed/monitored: Dementia; Limited social support, Level of care concerns, and Limited access to caregiver  Care Plan : LCSW Plan of Care  Updates made by [name], LCSW since 02/26/2022 12:00 AM     Problem: Mobility and Independence      Long-Range Goal: Mobility and Independence Optimized   Start Date: 05/24/2021  Expected End Date: 02/25/2022  Recent Progress: On track  Priority: High  Note:   Current barriers:   Patient in need of assistance with connecting to community resources for Limited social support, Level of care concerns, and Cognitive Deficits Acknowledges deficits with meeting this unmet need Patient is unable to independently navigate community resource options without care coordination support Clinical Goals:  patient will demonstrate improved health management independence as evidenced byfamily report, overall activity/outcomes Clinical Interventions:  02/26/22- suggested son proceed with applying for Medicaid in the next month- he is still thinking probable need to place pt soon.    01/19/22- [name] is beginning to feel it may be time to seek placement options for pt in long term care. CSW advised son to apply for Medicaid and look at facilities (list received) and determine top choices.  CSW advised son of assignment changes along  with new CSW to be assigned to PCP next month. Will have new CSW follow up with son in 3-4 weeks.  12/18/21- CSW spoke with pt's son who reports pt has good and bad days- he continues to be concerned about his dad taking his RX correctly; stating "he probably gets them/right 50% of the time".  CSW will alert Pharmacist to this.   [name] indicates his father still mowing and goes to hit balls some- he also goes out for breakfast with a neighbor about 3-4 times weekly. Pt continues to deal with a finger wound and is seeing an ID specialist for this.  [name] continues to consider placement for pt in an ALF/memory care setting- unsure if pt will agree but has mentioned it to him a few times.  CSW discussed the cost, process, etc with son again- will ask Care Guide to follow up with son with ALF resources.     Collaboration with Michela Pitcher, NP regarding development and update of comprehensive plan of care as evidenced by provider attestation and co-signature Inter-disciplinary care team collaboration (see longitudinal plan of care) Assessment of needs, barriers , agencies contacted, as well as how impacting  Review various resources, discussed options and provided  patient information about Limited social support, Level of care concerns, Social Isolation, and Memory Deficits Collaborated with appropriate clinical care team members regarding patient needs Memory Deficits, Dementia Referral placed via [facility] Patient interviewed and appropriate assessments performed Other interventions provided: Reviewed mental health medications with patient and discussed importance of compliance:  Increase in actives / exercise encouraged  Patient Goals:  - continue taking medications as prescribed Follow Up Plan: SW will follow up with patient by phone over the next 45 days        Follow Up Plan: Appointment scheduled for SW follow up with client by phone on:       Eduard Clos MSW, LCSW Licensed  Clinical Social Worker   [phone]

## 2022-03-01 ENCOUNTER — Ambulatory Visit (INDEPENDENT_AMBULATORY_CARE_PROVIDER_SITE_OTHER): Payer: Medicare PPO | Admitting: Pharmacist

## 2022-03-01 DIAGNOSIS — N183 Chronic kidney disease, stage 3 unspecified: Secondary | ICD-10-CM

## 2022-03-01 DIAGNOSIS — I480 Paroxysmal atrial fibrillation: Secondary | ICD-10-CM

## 2022-03-01 DIAGNOSIS — I10 Essential (primary) hypertension: Secondary | ICD-10-CM

## 2022-03-01 DIAGNOSIS — I5022 Chronic systolic (congestive) heart failure: Secondary | ICD-10-CM

## 2022-03-01 DIAGNOSIS — E782 Mixed hyperlipidemia: Secondary | ICD-10-CM

## 2022-03-01 DIAGNOSIS — G3184 Mild cognitive impairment, so stated: Secondary | ICD-10-CM

## 2022-03-01 DIAGNOSIS — E1121 Type 2 diabetes mellitus with diabetic nephropathy: Secondary | ICD-10-CM

## 2022-03-01 NOTE — Progress Notes (Signed)
Chronic Care Management Pharmacy Note  03/01/2022 Name:  Randy [name] MRN:  [id number] DOB:  [birth year]  Summary: CCM F/U visit -Medication adherence is still an issue; pt has pill packs but memory issues lead to nonadherence, family is not able to monitor this as often as is needed. Family is looking into LTC options, starting with Medicaid application  Recommendations/Changes made from today's visit: -Encouraged applying for Medicaid and pursuing LTC  Plan: -PCP F/U 03/16/22 -Pharmacist follow up televisit scheduled for 3 months    Subjective: Randy [name] is an 76 y.o. year old male who is a primary patient of Jaidev, Sanger, NP.  The CCM team was consulted for assistance with disease management and care coordination needs.    Engaged with patient by telephone for follow up visit in response to provider referral for pharmacy case management and/or care coordination services.   Consent to Services:  The patient was given information about Chronic Care Management services, agreed to services, and gave verbal consent prior to initiation of services.  Please see initial visit note for detailed documentation.   Patient Care Team: Michela Pitcher, NP as PCP - General (Nurse Practitioner) Evans Lance, MD as PCP - Electrophysiology (Cardiology) Evans Lance, MD as Consulting Physician (Cardiology) Marybelle Killings, MD as Consulting Physician (Orthopedic Surgery) [name], LCSW as Social Worker Charlton Haws, [facility] as Pharmacist (Pharmacist)  Recent office visits: 06/29/21 NP Romilda Garret OV: chronic f/u; start Donepezil 5 mg daily HS.  Recent consult visits: 02/08/22 PA Tommye Standard (Cardiology): f/u - consider switching to Xarelto for once daily dosing. Looking into ALF.  07/06/21 PA Joesph July (Cardiology): f/u CHF, NSVT. Start amiodarone 200 mg daily (pt misses AM meds often, wanted amiodarone on board for long 1/2 life)  Hospital visits: None in previous  6 months   Objective:  Lab Results  Component Value Date   CREATININE 1.73 (H) 12/15/2021   BUN 25 (H) 12/15/2021   GFR 37.98 (L) 12/15/2021   GFRNONAA 23 (L) 06/10/2019   GFRAA 26 (L) 06/10/2019   NA 135 12/15/2021   K 4.7 12/15/2021   CALCIUM 9.2 12/15/2021   CO2 26 12/15/2021   GLUCOSE 326 (H) 12/15/2021    Lab Results  Component Value Date/Time   HGBA1C 10.0 (A) 11/28/2021 12:31 PM   HGBA1C 7.9 (A) 06/29/2021 11:47 AM   HGBA1C 8.9 (H) 03/10/2021 10:25 AM   HGBA1C 7.9 (H) 05/30/2020 08:39 AM   HGBA1C 6.4 06/16/2018 09:19 AM   GFR 37.98 (L) 12/15/2021 10:51 AM   GFR 31.54 (L) 12/01/2021 12:17 PM   MICROALBUR 21.7 (H) 12/15/2021 10:51 AM   MICROALBUR 2.0 (H) 08/05/2019 09:36 AM    Last diabetic Eye exam:  Lab Results  Component Value Date/Time   HMDIABEYEEXA No Retinopathy 07/14/2019 12:00 AM    Last diabetic Foot exam: No results found for: "HMDIABFOOTEX"   Lab Results  Component Value Date   CHOL 266 (H) 11/28/2021   HDL 33.60 (L) 11/28/2021   LDLCALC 109 (H) 06/29/2021   LDLDIRECT 123.0 11/28/2021   TRIG (H) 11/28/2021    714.0 Triglyceride is over 400; calculations on Lipids are invalid.   CHOLHDL 8 11/28/2021       Latest Ref Rng & Units 11/28/2021    1:04 PM 06/29/2021   11:54 AM 03/10/2021   10:25 AM  Hepatic Function  Total Protein 6.0 - 8.3 g/dL 7.2  7.0  7.1   Albumin 3.5 -  5.2 g/dL 4.3  3.9  4.4   AST 0 - 37 U/L _0 ALT 0 - 53 U/L _1 Alk Phosphatase 39 - 117 U/L 80  57  62   Total Bilirubin 0.2 - 1.2 mg/dL 0.4  0.5  0.6     Lab Results  Component Value Date/Time   TSH 1.45 03/10/2021 10:25 AM   TSH 0.53 06/16/2018 09:35 AM       Latest Ref Rng & Units 11/28/2021    1:04 PM 06/29/2021   11:54 AM 05/30/2020    8:39 AM  CBC  WBC 4.0 - 10.5 K/uL 9.1  6.9  7.8   Hemoglobin 13.0 - 17.0 g/dL 12.5  11.4  11.1   Hematocrit 39.0 - 52.0 % 37.2  34.7  33.4   Platelets 150.0 - 400.0 K/uL 205.0  204.0  176.0     Lab Results   Component Value Date/Time   VD25OH 19.27 (L) 11/28/2021 01:04 PM   VD25OH 31.61 03/10/2021 10:25 AM    Clinical ASCVD: Yes  The ASCVD Risk score (Arnett DK, et al., 2019) failed to calculate for the following reasons:   The patient has a prior MI or stroke diagnosis        12/13/2021    3:03 PM 03/10/2021    8:08 AM 11/20/2019    2:57 PM  Depression screen PHQ 2/9  Decreased Interest 0 0 0  Down, Depressed, Hopeless 0 0 0  PHQ - 2 Score 0 0 0  Altered sleeping   0  Tired, decreased energy   0  Change in appetite   0  Feeling bad or failure about yourself    0  Trouble concentrating   0  Moving slowly or fidgety/restless   0  Suicidal thoughts   0  PHQ-9 Score   0  Difficult doing work/chores   Not difficult at all     Social History   Tobacco Use  Smoking Status Former   Packs/day: 2.00   Years: 17.00   Total pack years: 34.00   Types: Cigarettes   Quit date: 07/30/1984   Years since quitting: 37.6  Smokeless Tobacco Never   BP Readings from Last 3 Encounters:  02/08/22 (!) 100/58  12/27/21 (!) 147/73  12/13/21 126/74   Pulse Readings from Last 3 Encounters:  02/08/22 71  12/27/21 65  12/13/21 66   Wt Readings from Last 3 Encounters:  02/08/22 186 lb (84.4 kg)  12/27/21 190 lb (86.2 kg)  12/13/21 190 lb (86.2 kg)   BMI Readings from Last 3 Encounters:  02/08/22 27.47 kg/m  12/27/21 28.89 kg/m  12/13/21 28.89 kg/m    Assessment/Interventions: Review of patient past medical history, allergies, medications, health status, including review of consultants reports, laboratory and other test data, was performed as part of comprehensive evaluation and provision of chronic care management services.   SDOH:  (Social Determinants of Health) assessments and interventions performed: Yes   SDOH Screenings   Alcohol Screen: Not on file  Depression (PHQ2-9): Low Risk  (12/13/2021)   Depression (PHQ2-9)    PHQ-2 Score: 0  Financial Resource Strain: Low Risk   (10/17/2021)   Overall Financial Resource Strain (CARDIA)    Difficulty of Paying Living Expenses: Not very hard  Food Insecurity: No Food Insecurity (10/17/2021)   Hunger Vital Sign    Worried About Running Out of Food in the Last Year: Never true  Ran Out of Food in the Last Year: Never true  Housing: Low Risk  (07/14/2021)   Housing    Last Housing Risk Score: 0  Physical Activity: Not on file  Social Connections: Not on file  Stress: Not on file  Tobacco Use: Medium Risk (02/08/2022)   Patient History    Smoking Tobacco Use: Former    Smokeless Tobacco Use: Never    Passive Exposure: Not on file  Transportation Needs: No Transportation Needs (05/24/2021)   PRAPARE - Hydrologist (Medical): No    Lack of Transportation (Non-Medical): No    CCM Care Plan  No Known Allergies  Medications Reviewed Today     Reviewed by Charlton Haws, [facility] (Pharmacist) on 03/01/22 at [place] List Status: <None>   Medication Order Taking? Sig Documenting Provider Last Dose Status Informant  Accu-Chek Softclix Lancets lancets [id number] Yes USE AS DIRECTED  TO TEST BLOOD SUGAR ONE TIME DAILY  AND AS NEEDED Elby Beck, FNP Taking Active   Alcohol Swabs (B-D SINGLE USE SWABS REGULAR) PADS [id number] Yes Use as instructed to clean area for glucose monitoring once daily and as needed.  Diagnosis:  E11.21  Non insulin-dependent Tonia Ghent, MD Taking Active Self  allopurinol (ZYLOPRIM) 100 MG tablet [id number] Yes Take 0.5 tablets (50 mg total) by mouth daily. Michela Pitcher, NP Taking Active   amiodarone (PACERONE) 200 MG tablet [id number] Yes Take 1 tablet (200 mg total) by mouth daily. Shirley Friar, PA-C Taking Active   apixaban (ELIQUIS) 5 MG TABS tablet [id number] Yes Take 1 tablet (5 mg total) by mouth 2 (two) times daily. Evans Lance, MD Taking Active   atorvastatin (LIPITOR) 80 MG tablet [id number] Yes TAKE 1 TABLET (80 MG TOTAL) DAILY AT  6 PM. Evans Lance, MD Taking Active   Blood Glucose Calibration ([place]) SOLN [id number] Yes Use to calibrate blood glucose machine as recommended.  Diagnosis:  E11.21  Non-insulin dependent. Tonia Ghent, MD Taking Active Self  Blood Glucose Monitoring Suppl (ACCU-CHEK GUIDE) w/Device KIT [id number] Yes Use to check blood sugar once daily. Elby Beck, FNP Taking Active   carvedilol (COREG) 3.125 MG tablet [id number] Yes Take 1 tablet (3.125 mg total) by mouth 2 (two) times daily. Evans Lance, MD Taking Active   donepezil (ARICEPT) 5 MG tablet [id number] Yes TAKE ONE TABLET BY MOUTH EVERY Elgie Congo, NP Taking Active   ezetimibe (ZETIA) 10 MG tablet [id number] Yes TAKE ONE TABLET BY MOUTH EVERY MORNING Michela Pitcher, NP Taking Active   finasteride (PROSCAR) 5 MG tablet [id number] Yes TAKE ONE TABLET BY MOUTH EVERY MORNING Michela Pitcher, NP Taking Active   furosemide (LASIX) 20 MG tablet [id number] Yes TAKE ONE TABLET BY MOUTH EVERY MORNING Michela Pitcher, NP Taking Active   gabapentin (NEURONTIN) 300 MG capsule [id number] Yes TAKE TWO CAPSULES BY MOUTH TWICE DAILY Michela Pitcher, NP Taking Active   glipiZIDE (GLUCOTROL XL) 10 MG 24 hr tablet [id number] Yes TAKE ONE TABLET BY MOUTH EVERY MORNING Michela Pitcher, NP Taking Active   glucose blood (ACCU-CHEK AVIVA PLUS) test strip [id number] Yes Test Blood Sugar 1 time daily: Dx:E11.27 Elby Beck, FNP Taking Active   sacubitril-valsartan (ENTRESTO) 24-26 MG [id number] Yes TAKE ONE TABLET BY MOUTH EVERY MORNING and TAKE ONE TABLET BY MOUTH EVERY EVENING Evans Lance, MD Taking Active   tamsulosin (FLOMAX) 0.4 MG CAPS capsule [id number] Yes  TAKE ONE CAPSULE BY MOUTH EVERY EVENING Michela Pitcher, NP Taking Active             Patient Active Problem List   Diagnosis Date Noted   Decreased renal function 12/01/2021   Open wnd of finger 11/28/2021   Mild cognitive impairment 03/10/2021   Mass of right foot  03/10/2021   Idiopathic gout of multiple sites 10/07/2018   Grief 06/16/2018   Dizziness 06/16/2018   Allergic rhinitis 04/24/2018   S/P CABG x 3 09/02/2017   Leukocytosis 08/27/2017   Visit for well man health check 05/07/2017   COPD (chronic obstructive pulmonary disease) ([place]) 47/42/5956   Chronic systolic CHF (congestive heart failure) ([place]) 10/18/2016   Generalized OA 06/22/2014   Hypotension due to drugs 03/22/2014   CKD (chronic kidney disease) stage 3, GFR 30-59 ml/min (HCC)    Synovial cyst of lumbar spine 07/17/2013    Class: Diagnosis of   Osteoarthritis of left hip 09/17/2012   Automatic implantable cardioverter-defibrillator in situ - St Jude 38/75/6433   Chronic systolic heart failure ([place]) 05/01/2011   IMPOTENCE OF ORGANIC ORIGIN 02/20/2010   Vitamin D deficiency 02/14/2010   Type 2 diabetes with nephropathy ([place]) 01/11/2010   Implantable cardioverter-defibrillator (ICD) in situ 05/10/2009   HLD (hyperlipidemia) 02/24/2008   Essential hypertension 02/24/2008   Non-ST elevation (NSTEMI) myocardial infarction ([place]) 02/24/2008   HYPERSOMNIA UNSPECIFIED 02/24/2008    Immunization History  Administered Date(s) Administered   Fluad Quad(high Dose 65+) 04/28/2019, 05/30/2020   Influenza Split 06/02/2012   Influenza Whole 05/18/2010   Influenza,inj,Quad PF,6+ Mos 04/23/2013, 03/22/2014, 07/07/2015, 06/13/2016, 04/22/2017, 03/31/2018   Influenza-Unspecified 05/07/2019   PFIZER(Purple Top)SARS-COV-2 Vaccination 09/22/2019, 10/13/2019, 12/04/2019, 05/22/2021   Pneumococcal Conjugate-13 06/22/2014   Pneumococcal Polysaccharide-23 03/09/2016   Td 01/14/2008   Zoster Recombinat (Shingrix) 07/09/2019   Zoster, Live 01/11/2010    Conditions to be addressed/monitored:  Hypertension, Hyperlipidemia, Diabetes, Atrial Fibrillation, Heart Failure, Coronary Artery Disease, and Chronic Kidney Disease  Care Plan : CCM Pharmacy Care Plan  Updates made by Charlton Haws, [place]  since 03/01/2022 12:00 AM     Problem: Hypertension, Hyperlipidemia, Diabetes, Atrial Fibrillation, Heart Failure, Coronary Artery Disease, and Chronic Kidney Disease   Priority: High     Long-Range Goal: Disease mgmt   Start Date: 10/18/2021  Expected End Date: 10/19/2022  This Visit's Progress: Not on track  Recent Progress: On track  Priority: High  Note:   Current Barriers:  Unable to independently monitor therapeutic efficacy Unable to self administer medications as prescribed  Pharmacist Clinical Goal(s):  Patient will achieve adherence to monitoring guidelines and medication adherence to achieve therapeutic efficacy achieve ability to self administer medications as prescribed through use of pill packs as evidenced by patient report through collaboration with PharmD and provider.   Interventions: 1:1 collaboration with Michela Pitcher, NP regarding development and update of comprehensive plan of care as evidenced by provider attestation and co-signature Inter-disciplinary care team collaboration (see longitudinal plan of care) Comprehensive medication review performed; medication list updated in electronic medical record  Hypertension / NSVT (BP goal <140/90) -Controlled - BP at goal in clinic -Last ejection fraction: 25-30% (Date: 07/2017) -HF type: Systolic; NYHA Class: II-III -Current treatment: Carvedilol 3.125 mg BID - Appropriate, Effective, Safe, Accessible Furosemide 20 mg daily - Appropriate, Effective, Safe, Accessible Entresto 24-26 mg BID - Appropriate, Effective, Safe, Accessible (Novartis PAP) -Medications previously tried: n/a  -Denies hypotensive/hypertensive symptoms -Educated on BP goals and benefits of medications for prevention of  heart attack, stroke and kidney damage; -Counseled to monitor BP at home periodically -Recommended to continue current medication;  Hyperlipidemia: (LDL goal < 70) -Not ideally controlled - LDL 123 (11/2021) above goal and  worsened since 06/2021, likely due to adherence issues -  compliance with all medications is an issue due to dementia -Hx NSTEMI -Current treatment: Atorvastatin 80 mg daily - Appropriate, Query Effective Ezetimibe 10 mg daily -Appropriate, Query Effective -Medications previously tried: n/a  -Educated on Cholesterol goals; Benefits of statin for ASCVD risk reduction; -Recommended to continue current medication; encouraged compliance with pill packs  Atrial Fibrillation (Goal: prevent stroke and major bleeding) -Query controlled - med compliance is an issue; concern that patient forgets bedtime doses so is missing 2nd dose of Eliquis often, would consider switching to Xarelto for once daily administration -CHADSVASC: 6 (age2, male CHF, HTN, CAD, DM) -Current treatment: Amiodarone 200 mg daily - Appropriate, Effective, Safe, Accessible Carvedilol 3.125 mg BID - Appropriate, Effective, Safe, Accessible Eliquis 5 mg BID - Appropriate, Query Effective -Medications previously tried: n/a -Counseled on increased risk of stroke due to Afib and benefits of anticoagulation for stroke prevention; -Recommend to continue current medication; consider switching Eliquis to Xarelto  Diabetes (A1c goal <8%) -Controlled - A1c 10.0% (11/2021) significantly worse than 06/2021 (7.9%), likely due to medication nonadherence -Current medications: Glipizide XL 10 mg daily AM - Appropriate, Effective, Safe, Accessible -Medications previously tried: metformin  -Educated on A1c and blood sugar goals; -Counseled to check feet daily and get yearly eye exams -Recommended to continue current medication; consider weekly injection GLP1-RA that can be administered by family to improve compliance  Cognitive impairment (Goal: slow progression) -Query controlled - memory issues are impacting health conditions (DM is worse, blood thinner likely ineffective, etc) -Current treatment  Donepezil 5 mg daily HS - Appropriate,  Effective, Safe, Accessible -Medications previously tried: n/a  -Recommended to continue current medication  Health Maintenance -Overall questionable medication adherence even with pill packs due to memory issues; per son, patient often forgets to take the packs even though the box is placed on his side table near his chair in living room -Encouraged son to monitor medication adherence as able  Patient Goals/Self-Care Activities Patient will:  - take medications as prescribed as evidenced by patient report and record review focus on medication adherence by pill packs        Medication Assistance: None required.  Patient affirms current coverage meets needs.  Compliance/Adherence/Medication fill history: Care Gaps: Colonoscopy (due 02/07/20) Eye exam (due 07/13/20) Foot exam (due 11/19/20, ordered 06/29/21)  Star-Rating Drugs: Atorvastatin - PDC 100% Glipizide - PDC 100%  Medication Access: Within the past 30 days, how often has patient missed a dose of medication? 25-50% of doses Is a pillbox or other method used to improve adherence? Yes  Factors that may affect medication adherence?  Dementia Are meds synced by current pharmacy? Yes  Are meds delivered by current pharmacy? Yes  Does patient experience delays in picking up medications due to transportation concerns? No   [facility] Reviewed: Is patient disadvantaged to use [facility]?: No  Current Rx insurance plan: [employer] Name and location of Current pharmacy:  [facility] - [place], [place] - [address] [place] [address] Phone: [phone] Fax: [phone]  [id number]- [place] [id number]: [phone]: [phone] [facility] services  reviewed with patient today?: Yes  30-day Packs (Delivery 03/05/22) Carvedilol 3.125 mg - 1  BID (breakfast and evening meal) Gabapentin 300 mg - 2 BID (breakfast and evening meal) Furosemide 20 mg - 1 daily (breakfast) Allopurinol 100 mg - 1/2 tablet daily (breakfast) Tamsulosin 0.33m - 1 daily (evening meal) Finasteride 5 mg - 1 daily (breakfast) Atorvastatin 80 mg - 1 tablet (evening meal) Glipizide ER 10 mg - 1 daily  (breakfast) Zetia 10 mg- 1 tablet daily (breakfast)  Donepezil 545m-1 tablet (evening meal) Amiodarone 20063m 1 tablet (breakfast) Eliquis 5mg65mtake 1 tablet breakfast 1 tablet evening meal   Care Plan and Follow Up Patient Decision:  Patient agrees to Care Plan and Follow-up.  Plan: Telephone follow up appointment with care management team member scheduled for:  3 months  LindCharlene BrookearmD, BCACP Clinical Pharmacist [phone]

## 2022-03-01 NOTE — Patient Instructions (Signed)
Visit Information  Phone number for Pharmacist: [phone]   Goals Addressed   None     Care Plan : [place]  Updates made by Charlton Haws, RPH since 03/01/2022 12:00 AM     Problem: Hypertension, Hyperlipidemia, Diabetes, Atrial Fibrillation, Heart Failure, Coronary Artery Disease, and Chronic Kidney Disease   Priority: High     Long-Range Goal: Disease mgmt   Start Date: 10/18/2021  Expected End Date: 10/19/2022  This Visit's Progress: Not on track  Recent Progress: On track  Priority: High  Note:   Current Barriers:  Unable to independently monitor therapeutic efficacy Unable to self administer medications as prescribed  Pharmacist Clinical Goal(s):  Patient will achieve adherence to monitoring guidelines and medication adherence to achieve therapeutic efficacy achieve ability to self administer medications as prescribed through use of pill packs as evidenced by patient report through collaboration with PharmD and provider.   Interventions: 1:1 collaboration with Michela Pitcher, NP regarding development and update of comprehensive plan of care as evidenced by provider attestation and co-signature Inter-disciplinary care team collaboration (see longitudinal plan of care) Comprehensive medication review performed; medication list updated in electronic medical record  Hypertension / NSVT (BP goal <140/90) -Controlled - BP at goal in clinic -Last ejection fraction: 25-30% (Date: 07/2017) -HF type: Systolic; NYHA Class: II-III -Current treatment: Carvedilol 3.125 mg BID - Appropriate, Effective, Safe, Accessible Furosemide 20 mg daily - Appropriate, Effective, Safe, Accessible Entresto 24-26 mg BID - Appropriate, Effective, Safe, Accessible (Novartis PAP) -Medications previously tried: n/a  -Denies hypotensive/hypertensive symptoms -Educated on BP goals and benefits of medications for prevention of heart attack, stroke and kidney damage; -Counseled to  monitor BP at home periodically -Recommended to continue current medication;  Hyperlipidemia: (LDL goal < 70) -Not ideally controlled - LDL 123 (11/2021) above goal and worsened since 06/2021, likely due to adherence issues -  compliance with all medications is an issue due to dementia -Hx NSTEMI -Current treatment: Atorvastatin 80 mg daily - Appropriate, Query Effective Ezetimibe 10 mg daily -Appropriate, Query Effective -Medications previously tried: n/a  -Educated on Cholesterol goals; Benefits of statin for ASCVD risk reduction; -Recommended to continue current medication; encouraged compliance with pill packs  Atrial Fibrillation (Goal: prevent stroke and major bleeding) -Query controlled - med compliance is an issue; concern that patient forgets bedtime doses so is missing 2nd dose of Eliquis often, would consider switching to Xarelto for once daily administration -CHADSVASC: 6 (age2, male CHF, HTN, CAD, DM) -Current treatment: Amiodarone 200 mg daily - Appropriate, Effective, Safe, Accessible Carvedilol 3.125 mg BID - Appropriate, Effective, Safe, Accessible Eliquis 5 mg BID - Appropriate, Query Effective -Medications previously tried: n/a -Counseled on increased risk of stroke due to Afib and benefits of anticoagulation for stroke prevention; -Recommend to continue current medication; consider switching Eliquis to Xarelto  Diabetes (A1c goal <8%) -Controlled - A1c 10.0% (11/2021) significantly worse than 06/2021 (7.9%), likely due to medication nonadherence -Current medications: Glipizide XL 10 mg daily AM - Appropriate, Effective, Safe, Accessible -Medications previously tried: metformin  -Educated on A1c and blood sugar goals; -Counseled to check feet daily and get yearly eye exams -Recommended to continue current medication; consider weekly injection GLP1-RA that can be administered by family to improve compliance  Cognitive impairment (Goal: slow progression) -Query  controlled - memory issues are impacting health conditions (DM is worse, blood thinner likely ineffective, etc) -Current treatment  Donepezil 5 mg daily HS - Appropriate, Effective, Safe, Accessible -Medications previously tried: n/a  -  Recommended to continue current medication  Health Maintenance -Overall questionable medication adherence even with pill packs due to memory issues; per son, patient often forgets to take the packs even though the box is placed on his side table near his chair in living room -Encouraged son to monitor medication adherence as able  Patient Goals/Self-Care Activities Patient will:  - take medications as prescribed as evidenced by patient report and record review focus on medication adherence by pill packs       The patient verbalized understanding of instructions, educational materials, and care plan provided today and DECLINED offer to receive copy of patient instructions, educational materials, and care plan.  Telephone follow up appointment with pharmacy team member scheduled for: 3 months  Charlene Brooke, PharmD, [facility] Clinical Pharmacist [facility] at [facility] [phone]

## 2022-03-12 ENCOUNTER — Ambulatory Visit (INDEPENDENT_AMBULATORY_CARE_PROVIDER_SITE_OTHER): Payer: Medicare PPO

## 2022-03-12 DIAGNOSIS — I5022 Chronic systolic (congestive) heart failure: Secondary | ICD-10-CM | POA: Diagnosis not present

## 2022-03-12 DIAGNOSIS — Z95810 Presence of automatic (implantable) cardiac defibrillator: Secondary | ICD-10-CM

## 2022-03-13 ENCOUNTER — Encounter: Payer: Self-pay | Admitting: *Deleted

## 2022-03-13 ENCOUNTER — Telehealth: Payer: Self-pay | Admitting: *Deleted

## 2022-03-13 NOTE — Patient Outreach (Signed)
  Care Coordination   03/13/2022 Name: Randy [name] MRN: [id number] DOB: 21-Sep-1945   Care Coordination Outreach Attempts:  An unsuccessful telephone outreach was attempted today to offer the patient information about available care coordination services as a benefit of their health plan.   Follow Up Plan:  Additional outreach attempts will be made to offer the patient care coordination information and services.   Encounter Outcome:  No Answer  Care Coordination Interventions Activated:  No   Care Coordination Interventions:  No, not indicated    Eduard Clos MSW, LCSW Licensed Clinical Social Worker      [phone]

## 2022-03-14 ENCOUNTER — Telehealth: Payer: Self-pay

## 2022-03-14 NOTE — Chronic Care Management (AMB) (Signed)
Chronic Care Management Pharmacy Assistant   Name: Randy [name]  MRN: [id number] DOB: 1945-10-21   Reason for Encounter: Medication Adherence and Delivery Coordination    Recent office visits:  None since last CCM contact  Recent consult visits:  None since last CCM contact  Hospital visits:  None in previous 6 months  Medications: Outpatient Encounter Medications as of 03/14/2022  Medication Sig   Accu-Chek Softclix Lancets lancets USE AS DIRECTED  TO TEST BLOOD SUGAR ONE TIME DAILY  AND AS NEEDED   Alcohol Swabs (B-D SINGLE USE SWABS REGULAR) PADS Use as instructed to clean area for glucose monitoring once daily and as needed.  Diagnosis:  E11.21  Non insulin-dependent   allopurinol (ZYLOPRIM) 100 MG tablet Take 0.5 tablets (50 mg total) by mouth daily.   amiodarone (PACERONE) 200 MG tablet Take 1 tablet (200 mg total) by mouth daily.   apixaban (ELIQUIS) 5 MG TABS tablet Take 1 tablet (5 mg total) by mouth 2 (two) times daily.   atorvastatin (LIPITOR) 80 MG tablet TAKE 1 TABLET (80 MG TOTAL) DAILY AT 6 PM.   Blood Glucose Calibration (ACCU-CHEK AVIVA) SOLN Use to calibrate blood glucose machine as recommended.  Diagnosis:  E11.21  Non-insulin dependent.   Blood Glucose Monitoring Suppl (ACCU-CHEK GUIDE) w/Device KIT Use to check blood sugar once daily.   carvedilol (COREG) 3.125 MG tablet Take 1 tablet (3.125 mg total) by mouth 2 (two) times daily.   donepezil (ARICEPT) 5 MG tablet TAKE ONE TABLET BY MOUTH EVERY EVENING   ezetimibe (ZETIA) 10 MG tablet TAKE ONE TABLET BY MOUTH EVERY MORNING   finasteride (PROSCAR) 5 MG tablet TAKE ONE TABLET BY MOUTH EVERY MORNING   furosemide (LASIX) 20 MG tablet TAKE ONE TABLET BY MOUTH EVERY MORNING   gabapentin (NEURONTIN) 300 MG capsule TAKE TWO CAPSULES BY MOUTH TWICE DAILY   glipiZIDE (GLUCOTROL XL) 10 MG 24 hr tablet TAKE ONE TABLET BY MOUTH EVERY MORNING   glucose blood (ACCU-CHEK AVIVA PLUS) test strip Test Blood Sugar 1 time  daily: Dx:E11.27   sacubitril-valsartan (ENTRESTO) 24-26 MG TAKE ONE TABLET BY MOUTH EVERY MORNING and TAKE ONE TABLET BY MOUTH EVERY EVENING   tamsulosin (FLOMAX) 0.4 MG CAPS capsule TAKE ONE CAPSULE BY MOUTH EVERY EVENING   No facility-administered encounter medications on file as of 03/14/2022.   BP Readings from Last 3 Encounters:  02/08/22 (!) 100/58  12/27/21 (!) 147/73  12/13/21 126/74    Lab Results  Component Value Date   HGBA1C 10.0 (A) 11/28/2021      No OVs, Consults, or hospital visits since last care coordination call / Pharmacist visit. No medication changes indicated   Last adherence delivery date:03/05/22      Patient is due for next adherence delivery on: 03/26/22  Spoke with patient on 03/15/22 and reviewed medications. Patient denied the need for any medications at this time. Spoke with the son Randy [name] and he is Engineer, agricultural for his father and states Randy [name] has 1 months supply on hand.  This delivery to include: Adherence Packaging  30 Days   Patient declined the following medications this month: Carvedilol 3.125 mg - 1 BID (breakfast and evening meal) Gabapentin 300 mg - 2 BID (breakfast and evening meal) Furosemide 20 mg - 1 daily (breakfast) Allopurinol 100 mg - 1/2 tablet daily (breakfast) Tamsulosin 0.35m - 1 daily (evening meal) Finasteride 5 mg - 1 daily (breakfast) Atorvastatin 80 mg - 1 tablet (evening meal) Glipizide ER 10 mg -  1 daily  (breakfast) Zetia 10 mg- 1 tablet daily (breakfast)  Donepezil 530m -1 tablet (evening meal) Amiodarone 2066m- 1 tablet (breakfast) Eliquis 30m21m take 1 tablet breakfast 1 tablet evening meal    Any concerns about your medications? Patient has supply of all medications on hand. The patient is changing insurance due to co-pays too high.  How often do you forget or accidentally miss a dose? The patient has difficulty with remembering to take meds 2 times a day    No refill request needed.    Annual  wellness visit in last year? No Most Recent BP reading: 100/58  02/08/22  Cycle dispensing [facility] for [facility].   LinCharlene BrookePP notified  [place]ensorCMB[phone]

## 2022-03-15 NOTE — Progress Notes (Signed)
EPIC Encounter for ICM Monitoring  Patient Name: Randy [name] is a 76 y.o. male Date: 03/15/2022 Primary Care Physican: Michela Pitcher, NP Primary Cardiologist: Lovena Le Electrophysiologist: Lovena Le 02/08/2022 Office Weight: 186lbs   AT/AF Burden: 8.9%      Attempted call to patient and unable to reach.  Transmission reviewed.    Corvue thoracic impedance suggesting possible fluid accumulation starting 8/11 but trending back toward baseline.     Prescribed: Furosemide 20 mg 1 tablet daily   Labs: 12/15/2021 Creatinine 1.73, BUN 25, Potassium 4.7, Sodium 135, GFR 37.98 12/01/2021 Creatinine 2.02, BUN 37, Potassium 5.3, Sodium 135, GFR 31.54  11/28/2021 Creatinine 2.39, BUN 32, Potassium 3.8, Sodium 134, GFR 25.78  A complete set of results can be found in Results Review.   Recommendations: Unable to reach.     Follow-up plan: ICM clinic phone appointment on 03/19/2022 to recheck fluid levels.  91 day device clinic remote transmission scheduled for 04/03/2022.    EP/Cardiology Office Visits:  03/20/2022 with Tommye Standard, PA.     Copy of ICM check sent to Dr. Lovena Le.    3 month ICM trend: 03/13/2022.    12-14 Month ICM trend:     Rosalene Billings, RN 03/15/2022 3:12 PM

## 2022-03-16 ENCOUNTER — Ambulatory Visit (INDEPENDENT_AMBULATORY_CARE_PROVIDER_SITE_OTHER): Payer: Medicare PPO | Admitting: Nurse Practitioner

## 2022-03-16 VITALS — BP 110/58 | HR 73 | Temp 98.0°F | Resp 12 | Ht 69.0 in | Wt 184.5 lb

## 2022-03-16 DIAGNOSIS — G3184 Mild cognitive impairment, so stated: Secondary | ICD-10-CM

## 2022-03-16 DIAGNOSIS — N183 Chronic kidney disease, stage 3 unspecified: Secondary | ICD-10-CM

## 2022-03-16 DIAGNOSIS — E1121 Type 2 diabetes mellitus with diabetic nephropathy: Secondary | ICD-10-CM

## 2022-03-16 DIAGNOSIS — I10 Essential (primary) hypertension: Secondary | ICD-10-CM

## 2022-03-16 LAB — POCT GLYCOSYLATED HEMOGLOBIN (HGB A1C): Hemoglobin A1C: 9.1 % — AB (ref 4.0–5.6)

## 2022-03-16 LAB — CBC
HCT: 36.7 % — ABNORMAL LOW (ref 39.0–52.0)
Hemoglobin: 12.1 g/dL — ABNORMAL LOW (ref 13.0–17.0)
MCHC: 33.1 g/dL (ref 30.0–36.0)
MCV: 86.2 fl (ref 78.0–100.0)
Platelets: 167 10*3/uL (ref 150.0–400.0)
RBC: 4.25 Mil/uL (ref 4.22–5.81)
RDW: 13.9 % (ref 11.5–15.5)
WBC: 6.7 10*3/uL (ref 4.0–10.5)

## 2022-03-16 LAB — BASIC METABOLIC PANEL
BUN: 24 mg/dL — ABNORMAL HIGH (ref 6–23)
CO2: 25 mEq/L (ref 19–32)
Calcium: 8.8 mg/dL (ref 8.4–10.5)
Chloride: 102 mEq/L (ref 96–112)
Creatinine, Ser: 1.71 mg/dL — ABNORMAL HIGH (ref 0.40–1.50)
GFR: 38.45 mL/min — ABNORMAL LOW (ref >60.00)
Glucose, Bld: 220 mg/dL — ABNORMAL HIGH (ref 70–99)
Potassium: 3.7 mEq/L (ref 3.5–5.1)
Sodium: 136 mEq/L (ref 135–145)

## 2022-03-16 NOTE — Assessment & Plan Note (Signed)
Still maintained on donepezil this is playing a factor in the patient's adherence to medical therapies.  Had discussion with patient's son about status of patient moving to an assisted living.  States son is getting in new Education officer, museum for father and they went to work on applying for [employer].  He does have a list of local facilities in the area that was provided by social work

## 2022-03-16 NOTE — Patient Instructions (Addendum)
Nice to see you today I will be in touch with the lab results Continue taking medication as prescribed Follow up with me in about 3.5 months for your physical at that point   Dr. Celesta Gentile  Foot doctor  Address: [address], [place], [place] [address] Phone: [phone]

## 2022-03-16 NOTE — Assessment & Plan Note (Signed)
Patient has had about one-point A1c reduction today no change in medications.  Patient has trouble with medication adherence in the past.  Did have discussion with patient and his son in regards to getting assisted living.  Patient is working with Education officer, museum and going to apply for Medicaid for his father.  New medication as prescribed

## 2022-03-16 NOTE — Assessment & Plan Note (Signed)
Used to be followed by Dr. Juleen China.  No longer followed by nephrology pending labs today.

## 2022-03-16 NOTE — Progress Notes (Signed)
Established Patient Office Visit  Subjective   Patient ID: FED CECI, male    DOB: [birth year]  Age: 76 y.o. MRN: [id number]  Chief Complaint  Patient presents with   Diabetes    Follow up    Diabetes Pertinent negatives for hypoglycemia include no dizziness or headaches. Pertinent negatives for diabetes include no chest pain.    DM2: Not checking sugars. When he takes the medicine he tolerates it wel. He enjoys sweet tea. 2 meals a day. States breakfast and dinner. Sometimes he snacks  CKD: States  MCI: States that he is followed by Education officer, museum. States that they case is being taken over. Has not applied for medicaid   COPD: Not shob with rest. DOE  HTN: Does not check at home    Review of Systems  Constitutional:  Negative for chills and fever.  Respiratory:  Negative for shortness of breath.   Cardiovascular:  Negative for chest pain and leg swelling.  Gastrointestinal:  Negative for abdominal pain, nausea and vomiting.       BM daily   Genitourinary:  Negative for dysuria and frequency.       Nocturia intermittent   Neurological:  Negative for dizziness and headaches.      Objective:     BP (!) 110/58   Pulse 73   Temp 98 F (36.7 C)   Resp 12   Ht '5\' 9"'$  (1.753 m)   Wt 184 lb 8 oz (83.7 kg)   SpO2 99%   BMI 27.25 kg/m  BP Readings from Last 3 Encounters:  03/16/22 (!) 110/58  02/08/22 (!) 100/58  12/27/21 (!) 147/73   Wt Readings from Last 3 Encounters:  03/16/22 184 lb 8 oz (83.7 kg)  02/08/22 186 lb (84.4 kg)  12/27/21 190 lb (86.2 kg)      Physical Exam Vitals and nursing note reviewed.  Constitutional:      Appearance: Normal appearance.  Cardiovascular:     Rate and Rhythm: Normal rate and regular rhythm.     Pulses: Normal pulses.          Dorsalis pedis pulses are 2+ on the right side and 2+ on the left side.       Posterior tibial pulses are 2+ on the right side and 2+ on the left side.     Heart sounds: Normal heart sounds.   Pulmonary:     Effort: Pulmonary effort is normal.     Breath sounds: Normal breath sounds.  Abdominal:     General: Bowel sounds are normal. There is no distension.     Palpations: There is no mass.     Tenderness: There is no abdominal tenderness.     Hernia: No hernia is present.  Musculoskeletal:       Feet:  Feet:     Right foot:     Skin integrity: Dry skin present.     Toenail Condition: Right toenails are long.     Left foot:     Skin integrity: Dry skin present.     Toenail Condition: Left toenails are long.  Neurological:     Mental Status: He is alert.      Results for orders placed or performed in visit on 03/16/22  POCT glycosylated hemoglobin (Hb A1C)  Result Value Ref Range   Hemoglobin A1C 9.1 (A) 4.0 - 5.6 %   HbA1c POC (<> result, manual entry)     HbA1c, POC (prediabetic range)  HbA1c, POC (controlled diabetic range)        The ASCVD Risk score (Arnett DK, et al., 2019) failed to calculate for the following reasons:   The patient has a prior MI or stroke diagnosis    Assessment & Plan:   Problem List Items Addressed This Visit       Cardiovascular and Mediastinum   Essential hypertension   Relevant Orders   CBC   Basic metabolic panel     Endocrine   Type 2 diabetes with nephropathy ([place]) - Primary    Patient has had about one-point A1c reduction today no change in medications.  Patient has trouble with medication adherence in the past.  Did have discussion with patient and his son in regards to getting assisted living.  Patient is working with Education officer, museum and going to apply for Medicaid for his father.  New medication as prescribed       Relevant Orders   POCT glycosylated hemoglobin (Hb A1C) (Completed)   CBC   Basic metabolic panel     Nervous and Auditory   Mild cognitive impairment    Still maintained on donepezil this is playing a factor in the patient's adherence to medical therapies.  Had discussion with patient's son  about status of patient moving to an assisted living.  States son is getting in new Education officer, museum for father and they went to work on applying for [employer].  He does have a list of local facilities in the area that was provided by social work        Genitourinary   CKD (chronic kidney disease) stage 3, GFR 30-59 ml/min (HCC)    Used to be followed by Dr. Juleen China.  No longer followed by nephrology pending labs today.      Relevant Orders   Basic metabolic panel    Return in about 14 weeks (around 06/22/2022) for CPE and labs.    Romilda Garret, NP

## 2022-03-19 ENCOUNTER — Ambulatory Visit (INDEPENDENT_AMBULATORY_CARE_PROVIDER_SITE_OTHER): Payer: Medicare PPO

## 2022-03-19 DIAGNOSIS — Z95810 Presence of automatic (implantable) cardiac defibrillator: Secondary | ICD-10-CM

## 2022-03-19 DIAGNOSIS — I5022 Chronic systolic (congestive) heart failure: Secondary | ICD-10-CM

## 2022-03-20 ENCOUNTER — Encounter: Payer: Medicare PPO | Admitting: Physician Assistant

## 2022-03-21 ENCOUNTER — Other Ambulatory Visit: Payer: Self-pay | Admitting: Internal Medicine

## 2022-03-21 ENCOUNTER — Telehealth: Payer: Self-pay

## 2022-03-21 ENCOUNTER — Other Ambulatory Visit: Payer: Self-pay | Admitting: Nurse Practitioner

## 2022-03-21 DIAGNOSIS — G3184 Mild cognitive impairment, so stated: Secondary | ICD-10-CM

## 2022-03-21 DIAGNOSIS — I5022 Chronic systolic (congestive) heart failure: Secondary | ICD-10-CM

## 2022-03-21 DIAGNOSIS — M79672 Pain in left foot: Secondary | ICD-10-CM

## 2022-03-21 DIAGNOSIS — I10 Essential (primary) hypertension: Secondary | ICD-10-CM

## 2022-03-21 NOTE — Telephone Encounter (Signed)
CV Remote Solution Alert  Merlin alert for "VT"/ATP therapies. Episode on 03/16/22 '@1246'$ , AFlutter with RVR (into detection) with rate avg 184bpm, ATP x1 slowed rate x 3-4 beats, then VR back to avg 150-160's. AF onset approx 2.5hrs prior to therapies. Presenting rhythm AsVs. AF burden 8.7%. OAC- Eliquis, on Amiodarone. See EPIC OV note. Routing for further review.  Spoke to Dr. Lovena Le about case. Appears AF w/ RVR w/ ATP x1. Patient back in SR. No changes per GT and no driving restrictions per GT.

## 2022-03-23 NOTE — Progress Notes (Signed)
EPIC Encounter for ICM Monitoring  Patient Name: Randy [name] is a 76 y.o. male Date: 03/23/2022 Primary Care Physican: Michela Pitcher, NP Primary Cardiologist: Lovena Le Electrophysiologist: Lovena Le 02/08/2022 Office Weight: 186 lbs   AT/AF Burden: 8.5%      Transmission reviewed.    Corvue thoracic impedance suggesting possible fluid accumulation starting 8/11-8/19 but trended back to baseline.     Prescribed: Furosemide 20 mg 1 tablet daily   Labs: 03/16/2022 Creatinine 1.71, BUN 24, Potassium 3.7, Sodium 136, GFR 38.45 12/15/2021 Creatinine 1.73, BUN 25, Potassium 4.7, Sodium 135, GFR 37.98 12/01/2021 Creatinine 2.02, BUN 37, Potassium 5.3, Sodium 135, GFR 31.54  11/28/2021 Creatinine 2.39, BUN 32, Potassium 3.8, Sodium 134, GFR 25.78  A complete set of results can be found in Results Review.   Recommendations:  No changes.     Follow-up plan: ICM clinic phone appointment on 04/16/2022 to recheck fluid levels.  91 day device clinic remote transmission scheduled for 04/03/2022.    EP/Cardiology Office Visits:  04/19/2022 with Tommye Standard, PA.     Copy of ICM check sent to Dr. Lovena Le.    3 month ICM trend: 03/20/2022.    12-14 Month ICM trend:     Rosalene Billings, RN 03/23/2022 11:04 AM

## 2022-03-26 ENCOUNTER — Telehealth: Payer: Self-pay

## 2022-03-26 ENCOUNTER — Telehealth: Payer: Self-pay | Admitting: *Deleted

## 2022-03-26 NOTE — Chronic Care Management (AMB) (Signed)
  Care Coordination  Outreach Note  03/26/2022 Name: KAMEN HANKEN MRN: [id number] DOB: February 13, 1946   Care Coordination Outreach Attempts  An unsuccessful telephone outreach was attempted today to offer the patient information about available care coordination services as a benefit of their health plan.   Attempting to reschedule   Follow Up Plan:  Additional outreach attempts will be made to offer the patient care coordination information and services.   Encounter Outcome:  No Answer  Julian Hy, [place] Direct Dial: [phone]

## 2022-03-26 NOTE — Telephone Encounter (Signed)
Continue current treatment. 

## 2022-03-26 NOTE — Telephone Encounter (Signed)
ICD alert received from [employer]:  Device alert for ATP therapy Events occurred 8/26 @ 17:30 and 17:31, EGM's show AF with likely rapid ventricular response, HR's 181-184 falling in to the VT-1 zone, longest in duration 20 seconds,  ATP delivered x1 with both events slowing ventricular rate Presenting AF with overall controlled rates, ongoing from 8/26 @ 17:31, Eliquis, Amiodarone Route to triage [place]  Patient noted previous ATP X 1 due to a flutter with RVR x 2.5 hours prior to therapies.  Confirmed with Dr. Lovena Le, no changes previously considered. Patient was back in normal rhythm.  Routing new alert to Dr. Lovena Le for Aflutter with RVR. Patient presenting rhythm: A Flutter with controlled ventricular rates.    Patient has office visit with Marinus Maw, PA on 04/19/22.

## 2022-03-27 NOTE — Chronic Care Management (AMB) (Signed)
  Care Coordination  Outreach Note  03/27/2022 Name: Randy [name] MRN: [id number] DOB: [birth year]   Care Coordination Outreach Attempts  A second unsuccessful outreach was attempted today to offer the patient with information about available care coordination services as a benefit of their health plan.     Rescheduling   Follow Up Plan:  Additional outreach attempts will be made to offer the patient care coordination information and services.   Encounter Outcome:  No Answer  Julian Hy, [place] Direct Dial: [phone]

## 2022-03-29 DIAGNOSIS — E1159 Type 2 diabetes mellitus with other circulatory complications: Secondary | ICD-10-CM

## 2022-03-29 DIAGNOSIS — E782 Mixed hyperlipidemia: Secondary | ICD-10-CM

## 2022-03-29 DIAGNOSIS — I5020 Unspecified systolic (congestive) heart failure: Secondary | ICD-10-CM

## 2022-03-29 DIAGNOSIS — I4891 Unspecified atrial fibrillation: Secondary | ICD-10-CM

## 2022-03-29 DIAGNOSIS — I110 Hypertensive heart disease with heart failure: Secondary | ICD-10-CM

## 2022-03-29 DIAGNOSIS — Z7984 Long term (current) use of oral hypoglycemic drugs: Secondary | ICD-10-CM

## 2022-03-29 NOTE — Telephone Encounter (Signed)
Noted. Thanks.

## 2022-03-30 ENCOUNTER — Telehealth: Payer: Self-pay

## 2022-04-03 ENCOUNTER — Ambulatory Visit (INDEPENDENT_AMBULATORY_CARE_PROVIDER_SITE_OTHER): Payer: Medicare HMO

## 2022-04-03 DIAGNOSIS — I5022 Chronic systolic (congestive) heart failure: Secondary | ICD-10-CM | POA: Diagnosis not present

## 2022-04-04 LAB — CUP PACEART REMOTE DEVICE CHECK
Battery Remaining Longevity: 44 mo
Battery Remaining Percentage: 46 %
Battery Voltage: 2.9 V
Brady Statistic AP VP Percent: 1 %
Brady Statistic AP VS Percent: 5.6 %
Brady Statistic AS VP Percent: 1 %
Brady Statistic AS VS Percent: 89 %
Brady Statistic RA Percent Paced: 1.6 %
Brady Statistic RV Percent Paced: 2.6 %
Date Time Interrogation Session: 20230906040212
HighPow Impedance: 73 Ohm
HighPow Impedance: 73 Ohm
Implantable Lead Implant Date: 20091022
Implantable Lead Implant Date: 20180322
Implantable Lead Location: 753859
Implantable Lead Location: 753860
Implantable Lead Model: 7121
Implantable Pulse Generator Implant Date: 20180322
Lead Channel Impedance Value: 1875 Ohm
Lead Channel Impedance Value: 300 Ohm
Lead Channel Pacing Threshold Amplitude: 0.75 V
Lead Channel Pacing Threshold Amplitude: 1.25 V
Lead Channel Pacing Threshold Pulse Width: 0.5 ms
Lead Channel Pacing Threshold Pulse Width: 0.8 ms
Lead Channel Sensing Intrinsic Amplitude: 12 mV
Lead Channel Sensing Intrinsic Amplitude: 2.1 mV
Lead Channel Setting Pacing Amplitude: 2 V
Lead Channel Setting Pacing Amplitude: 3 V
Lead Channel Setting Pacing Pulse Width: 0.8 ms
Lead Channel Setting Sensing Sensitivity: 0.5 mV
Pulse Gen Serial Number: 7413245

## 2022-04-16 ENCOUNTER — Ambulatory Visit (INDEPENDENT_AMBULATORY_CARE_PROVIDER_SITE_OTHER): Payer: Medicare HMO

## 2022-04-16 DIAGNOSIS — Z95810 Presence of automatic (implantable) cardiac defibrillator: Secondary | ICD-10-CM | POA: Diagnosis not present

## 2022-04-16 DIAGNOSIS — I5022 Chronic systolic (congestive) heart failure: Secondary | ICD-10-CM | POA: Diagnosis not present

## 2022-04-18 NOTE — Chronic Care Management (AMB) (Signed)
  Care Coordination   Note   04/18/2022 Name: Randy [name] MRN: [id number] DOB: [birth year]  Randy [name] is a 76 y.o. year old male who sees [name], Alyson Locket, NP for primary care. I reached out to Randy [name] by phone today to offer care coordination services.  Rescheduling   Randy [name] was given information about Care Coordination services today including:   The Care Coordination services include support from the care team which includes your Nurse Coordinator, Clinical Social Worker, or Pharmacist.  The Care Coordination team is here to help remove barriers to the health concerns and goals most important to you. Care Coordination services are voluntary, and the patient may decline or stop services at any time by request to their care team member.   Care Coordination Consent Status: Patient agreed to services and verbal consent obtained.   Follow up plan:  Telephone appointment with care coordination team member scheduled for:  05/08/2022  Encounter Outcome:  Pt. Scheduled  Julian Hy, [place] Direct Dial: [phone]

## 2022-04-19 ENCOUNTER — Encounter: Payer: Medicare PPO | Admitting: Physician Assistant

## 2022-04-20 ENCOUNTER — Telehealth: Payer: Self-pay

## 2022-04-20 NOTE — Progress Notes (Signed)
EPIC Encounter for ICM Monitoring  Patient Name: Randy [name] is a 76 y.o. male Date: 04/20/2022 Primary Care Physican: Michela Pitcher, NP Primary Cardiologist: Lovena Le Electrophysiologist: Lovena Le 02/08/2022 Office Weight: 186 lbs   AT/AF Burden: 6.8%      Transmission reviewed.    Corvue thoracic impedance suggesting normal fluid levels.  2 ATP episodes on 9/20 addressed with son by device clinic nurse.   Prescribed: Furosemide 20 mg 1 tablet daily   Labs: 03/16/2022 Creatinine 1.71, BUN 24, Potassium 3.7, Sodium 136, GFR 38.45 12/15/2021 Creatinine 1.73, BUN 25, Potassium 4.7, Sodium 135, GFR 37.98 12/01/2021 Creatinine 2.02, BUN 37, Potassium 5.3, Sodium 135, GFR 31.54  11/28/2021 Creatinine 2.39, BUN 32, Potassium 3.8, Sodium 134, GFR 25.78  A complete set of results can be found in Results Review.   Recommendations:  No changes.     Follow-up plan: ICM clinic phone appointment on 05/21/2022.  91 day device clinic remote transmission scheduled for 07/03/2022.    EP/Cardiology Office Visits:  05/15/2022 with Tommye Standard, PA.     Copy of ICM check sent to Dr. Lovena Le.     3 month ICM trend: 04/20/2022.    12-14 Month ICM trend:     Rosalene Billings, RN 04/20/2022 4:15 PM

## 2022-04-20 NOTE — Telephone Encounter (Addendum)
Device alert for sustained VT with successful ATP therapy. 2 events occurred 9/20 @ 14:54 and 16:13, EGM's show sustained VT, rates 187 & 190 ATP delivered successfully x1 with both events converting to regular AS/VP Corvue is stable. Route to triage per protocol.  Has upcoming apt with Joseph Art, PA-C 05/15/22.  No answer, LMTCB.

## 2022-04-20 NOTE — Chronic Care Management (AMB) (Signed)
Chronic Care Management Pharmacy Assistant   Name: Randy [name]  MRN: [id number] DOB: 1946-02-04  Reason for Encounter: Medication Adherence and Delivery Coordination    Recent office visits:  03/16/22-Santiel [name],NP(PCP)-f/u DM Labs(Labs are stable,Kidneys have improved just the slightest bit.)Did have discussion with patient and his son in regards to getting assisted living. F/u 14 weeks  Recent consult visits:  None since last CCM contact   Hospital visits:  None in previous 6 months  Medications: Outpatient Encounter Medications as of 03/30/2022  Medication Sig   Accu-Chek Softclix Lancets lancets USE AS DIRECTED  TO TEST BLOOD SUGAR ONE TIME DAILY  AND AS NEEDED   Alcohol Swabs (B-D SINGLE USE SWABS REGULAR) PADS Use as instructed to clean area for glucose monitoring once daily and as needed.  Diagnosis:  E11.21  Non insulin-dependent   allopurinol (ZYLOPRIM) 100 MG tablet Take 0.5 tablets (50 mg total) by mouth daily.   amiodarone (PACERONE) 200 MG tablet Take 1 tablet (200 mg total) by mouth daily.   apixaban (ELIQUIS) 5 MG TABS tablet Take 1 tablet (5 mg total) by mouth 2 (two) times daily.   atorvastatin (LIPITOR) 80 MG tablet TAKE 1 TABLET (80 MG TOTAL) DAILY AT 6 PM.   Blood Glucose Calibration (ACCU-CHEK AVIVA) SOLN Use to calibrate blood glucose machine as recommended.  Diagnosis:  E11.21  Non-insulin dependent.   Blood Glucose Monitoring Suppl (ACCU-CHEK GUIDE) w/Device KIT Use to check blood sugar once daily.   carvedilol (COREG) 3.125 MG tablet TAKE ONE TABLET BY MOUTH TWICE DAILY   donepezil (ARICEPT) 5 MG tablet TAKE ONE TABLET BY MOUTH EVERY EVENING   ezetimibe (ZETIA) 10 MG tablet TAKE ONE TABLET BY MOUTH EVERY MORNING   finasteride (PROSCAR) 5 MG tablet TAKE ONE TABLET BY MOUTH EVERY MORNING   furosemide (LASIX) 20 MG tablet TAKE ONE TABLET BY MOUTH EVERY MORNING   gabapentin (NEURONTIN) 300 MG capsule TAKE TWO CAPSULES BY MOUTH TWICE DAILY   glipiZIDE  (GLUCOTROL XL) 10 MG 24 hr tablet TAKE ONE TABLET BY MOUTH EVERY MORNING   glucose blood (ACCU-CHEK AVIVA PLUS) test strip Test Blood Sugar 1 time daily: Dx:E11.27   sacubitril-valsartan (ENTRESTO) 24-26 MG TAKE ONE TABLET BY MOUTH EVERY MORNING and TAKE ONE TABLET BY MOUTH EVERY EVENING   tamsulosin (FLOMAX) 0.4 MG CAPS capsule TAKE ONE CAPSULE BY MOUTH EVERY EVENING   No facility-administered encounter medications on file as of 03/30/2022.   BP Readings from Last 3 Encounters:  03/16/22 (!) 110/58  02/08/22 (!) 100/58  12/27/21 (!) 147/73    Lab Results  Component Value Date   HGBA1C 9.1 (A) 03/16/2022      Recent OV, Consult or Hospital visit:  03/16/22-PCP-no medication changes   Patient is due for next adherence delivery on: 05/02/22  Spoke with patient on 04/20/22 reviewed medications and coordinated delivery.  Spoke with son Laverna Peace  This delivery to include: Adherence Packaging  30 Days  Packs: Carvedilol 3.125 mg - 1 BID (breakfast and evening meal) Gabapentin 300 mg - 2 BID (breakfast and evening meal) Furosemide 20 mg - 1 daily (breakfast) Allopurinol 100 mg - 1/2 tablet daily (breakfast) Tamsulosin 0.23m - 1 daily (evening meal) Finasteride 5 mg - 1 daily (breakfast) Atorvastatin 80 mg - 1 tablet (evening meal) Glipizide ER 10 mg - 1 daily  (breakfast) Zetia 10 mg- 1 tablet daily (breakfast)  Donepezil 555m-1 tablet (evening meal) Amiodarone 20089m 1 tablet (breakfast) Eliquis 5mg53mtake 1 tablet  breakfast 1 tablet evening meal    VIAL medications: none   Patient declined the following medications this month: Entresto 24-33m  manufacturer   Any concerns about your medications? No started new insurance with [employer]  How often do you forget or accidentally miss a dose? Once a week hard to remember to take medications twice daily  Do you use a pillbox? No  Is patient in packaging Yes   No refill request needed.  Delivery scheduled for 05/02/22.  Unable to speak with patient to confirm date. Spoke with son JLaverna Peace Recent blood pressure readings are as follows:none available    Recent blood glucose readings are as follows:none available    Annual wellness visit in last year? No Most Recent BP reading:110/58   03/16/22  If Diabetic: Most recent A1C reading:9.1 Last eye exam / retinopathy screening:2020 Last diabetic foot exam: UTD  Cycle dispensing [facility] for review. LCharlene Brooke CPP notified  [place]ensor [place] [phone]

## 2022-04-20 NOTE — Telephone Encounter (Signed)
Patient's son, Laverna Peace, is returning call from RN.

## 2022-04-20 NOTE — Telephone Encounter (Signed)
Return sons call. Son states patient has short term memory loss and denies any symptoms. States he spoke to patient yesterday and all seemed well. Reports patient only has son to help with his medications which is about 50% of the time, otherwise patient may potentially miss doses of his medication. Stressful importance and compliance of medication use and these events could be a result of not compliant with medication regimen. [name] voiced understanding. Patient does drive a small amount of time per [place]. Advised per [place] DMV no driving x6 months starting on 04/18/22. Shock plan also reviewed w/ verbal understanding.  Randy [name] is aware of patients upcoming apt. On 05/15/22. Advised I will forward to Dr. Lovena Le and call if any changes are recommended. Voiced understanding.

## 2022-04-27 NOTE — Progress Notes (Signed)
Remote ICD transmission.   

## 2022-05-07 NOTE — Telephone Encounter (Signed)
Pt has upcoming appointment with Tommye Standard, PA-C on 05/15/2022 - will discuss concerns regarding 04/20/2022 note.

## 2022-05-08 ENCOUNTER — Telehealth: Payer: Self-pay | Admitting: *Deleted

## 2022-05-08 ENCOUNTER — Encounter: Payer: Self-pay | Admitting: *Deleted

## 2022-05-08 NOTE — Patient Outreach (Signed)
  Care Coordination   05/08/2022 Name: Randy [name] MRN: [id number] DOB: 10/30/1945   Care Coordination Outreach Attempts:  An unsuccessful telephone outreach was attempted today to offer the patient information about available care coordination services as a benefit of their health plan.   Follow Up Plan:  Additional outreach attempts will be made to offer the patient care coordination information and services.   Encounter Outcome:  No Answer  Care Coordination Interventions Activated:  No   Care Coordination Interventions:  No, not indicated    Eduard Clos MSW, LCSW Licensed Clinical Social Worker      [phone]

## 2022-05-13 NOTE — Progress Notes (Deleted)
Cardiology Office Note Date:  05/13/2022  Patient ID:  DJUAN [name], DOB [birth year], MRN [id number] PCP:  Michela Pitcher, NP  Electrophysiologist: Dr. Lovena Le    Chief Complaint: planned f/u, VT  History of Present Illness: Randy [name] is a 76 y.o. male with history of ICM, chronic CHF (systolic), VT, AFib, CAD (CABG 2019), CKD (III), HTN, HLD, DM  He comes in today to be seen for Dr. Lovena Le, last seen by him June 2021, no VT/maintaining SR off amiodarone, no changes were made with class II symptoms  He saw [name] Dec 2022 he had been shocked by his ICD several weeks prior, no clear syncope or symptoms, amiodarone was resumed, his son reported he often missed his AM meds.  NO-showed to 2 scheduled f/u visits  Device remote ATPs felt to be inappropriate in review with EP in the office, for rapid AFlutter, called to come in.    I saw him 02/08/22 He comes accompanied by his son. The patient resides independently, is known to have some degree of dementia, no longer drives/goes out alone, admittedly he agrees with his son, would likely get lost. He feels like for the most part he gets his AM meds taken perhaps not 100%, but more often then not, his PM/afternoon medicines perhaps infrequent at least, perhaps rarely, says he just forgets, it is not an intentional non-compliance with medicines. His son says that when they find un-taken pills, and the pt can not recall if he took others or not and worry about over dosing pills, so they go ahead and skip. The patient denies any symptoms As far as the don knows, he has never complained to him about anything/any symptoms No reports of falls or faints No CP, SOB He does not exercise, lives a pretty sedentary life, but denies symptoms or difficulties with ADLs  He was in AFlutter that had just started that day, no further/new treated episodes Felt medication noncompliance driving/contributing perhaps difficulty with rhythm  management Discussed strategies.reminders to help with meds His son did later call and confirm amiodarone was in his pill pack Discussed need for gen cards for his other cardiac management. Questioned perhaps change to xarelto to try and avoid BID meds to help with compliance  03/21/22 further AP tx for rapid AFlutter, presenting rhythm was AS/VS d/w Dr. Lovena Le, no med changes  04/20/22, TRUE VT episodes (x2), both success with ATP, device RN d/w pt's son, no reported symptoms to him at least, revisited likely missed meds.  Looks like [facility] social worker has rechout unable to get the patient.  *** social services *** son, home help *** stress test, ischemic eval with more VT now ?   Device information Abbott single chamber ICD implanted 05/20/2008 >> gen change and addition of RA lead 10/18/2016 + appropriate therapy  Amiodarone stopped 2021 after had run out and been off come months it seem (unclear)  Dec 2022 with VT treated w/HV tx. Amiodarone restarted Dec 2022   Has had inappropriate ATP as well for RVR  Past Medical History:  Diagnosis Date   AICD (automatic cardioverter/defibrillator) present 10/18/2016   "replaced 2009-[employer] device, Dr Lovena Le"   Anemia    Arthritis    "back" (08/27/2017)   CAD (coronary artery disease)    CHF (congestive heart failure) (HCC)    CKD (chronic kidney disease) stage 3, GFR 30-59 ml/min (HCC)    [name]   Diverticulosis    Dyslipidemia    GERD (gastroesophageal  reflux disease)    Hypertension    Neuromuscular disorder (HCC)    neuropathy- in hands   Osteoarthritis    hips, hands   Pacemaker    Shortness of breath    Sleep apnea    started a sleep study but didn't completed, states PCP told him he has sleep  apnea, doesn't use CPAP   Systolic dysfunction    Type 2 diabetes with nephropathy (HCC)    Vitamin D deficiency     Past Surgical History:  Procedure Laterality Date   ANTERIOR CERVICAL DECOMP/DISCECTOMY FUSION     BACK  SURGERY     CARPAL TUNNEL RELEASE Left    CORONARY ARTERY BYPASS GRAFT N/A 09/02/2017   Procedure: CORONARY ARTERY BYPASS GRAFTING (CABG) x three , using left internal mammary artery and right leg greater saphenous vein harvested endoscopically;  Surgeon: Randy Pollack, MD;  Location: [place];  Service: Open Heart Surgery;  Laterality: N/A;   CYST EXCISION     "back"   ICD IMPLANT N/A 10/18/2016   Procedure: ICD Implant- Upgrade to Dual Chamber;  Surgeon: Evans Lance, MD;  Location: [place] [facility];  Service: Cardiovascular;  Laterality: N/A;   INSERT / REPLACE / REMOVE PACEMAKER     JOINT REPLACEMENT     LEFT HEART CATH AND CORONARY ANGIOGRAPHY N/A 08/30/2017   Procedure: LEFT HEART CATH AND CORONARY ANGIOGRAPHY;  Surgeon: Martinique, Peter M, MD;  Location: [place] [facility];  Service: Cardiovascular;  Laterality: N/A;   LUMBAR LAMINECTOMY Left 07/17/2013   Procedure: MICRODISCECTOMY LUMBAR LAMINECTOMY;  Surgeon: Randy Killings, MD;  Location: [place];  Service: Orthopedics;  Laterality: Left;  Left L4 Laminotomy, Lateral Recess Decompression, Cyst Excision   SHOULDER OPEN ROTATOR CUFF REPAIR Left    TEE WITHOUT CARDIOVERSION N/A 09/02/2017   Procedure: TRANSESOPHAGEAL ECHOCARDIOGRAM (TEE);  Surgeon: Randy Pollack, MD;  Location: [place];  Service: Open Heart Surgery;  Laterality: N/A;   TOTAL HIP ARTHROPLASTY Left 09/17/2012   Procedure: Left TOTAL HIP ARTHROPLASTY ANTERIOR APPROACH;  Surgeon: Randy Killings, MD;  Location: [place];  Service: Orthopedics;  Laterality: Left;    Current Outpatient Medications  Medication Sig Dispense Refill   Accu-Chek Softclix Lancets lancets USE AS DIRECTED  TO TEST BLOOD SUGAR ONE TIME DAILY  AND AS NEEDED 200 each 2   Alcohol Swabs (B-D SINGLE USE SWABS REGULAR) PADS Use as instructed to clean area for glucose monitoring once daily and as needed.  Diagnosis:  E11.21  Non insulin-dependent 100 each 3   allopurinol (ZYLOPRIM) 100 MG tablet Take 0.5 tablets (50 mg  total) by mouth daily. 15 tablet 2   amiodarone (PACERONE) 200 MG tablet Take 1 tablet (200 mg total) by mouth daily. 90 tablet 3   apixaban (ELIQUIS) 5 MG TABS tablet Take 1 tablet (5 mg total) by mouth 2 (two) times daily. 60 tablet 5   atorvastatin (LIPITOR) 80 MG tablet TAKE 1 TABLET (80 MG TOTAL) DAILY AT 6 PM. 90 tablet 3   Blood Glucose Calibration (ACCU-CHEK AVIVA) SOLN Use to calibrate blood glucose machine as recommended.  Diagnosis:  E11.21  Non-insulin dependent. 1 each 2   Blood Glucose Monitoring Suppl (ACCU-CHEK GUIDE) w/Device KIT Use to check blood sugar once daily. 1 kit 0   carvedilol (COREG) 3.125 MG tablet TAKE ONE TABLET BY MOUTH TWICE DAILY 180 tablet 3   donepezil (ARICEPT) 5 MG tablet TAKE ONE TABLET BY MOUTH EVERY EVENING 90 tablet 1  ezetimibe (ZETIA) 10 MG tablet TAKE ONE TABLET BY MOUTH EVERY MORNING 90 tablet 2   finasteride (PROSCAR) 5 MG tablet TAKE ONE TABLET BY MOUTH EVERY MORNING 30 tablet 5   furosemide (LASIX) 20 MG tablet TAKE ONE TABLET BY MOUTH EVERY MORNING 30 tablet 2   gabapentin (NEURONTIN) 300 MG capsule TAKE TWO CAPSULES BY MOUTH TWICE DAILY 120 capsule 2   glipiZIDE (GLUCOTROL XL) 10 MG 24 hr tablet TAKE ONE TABLET BY MOUTH EVERY MORNING 90 tablet 1   glucose blood (ACCU-CHEK AVIVA PLUS) test strip Test Blood Sugar 1 time daily: Dx:E11.27 100 each 3   sacubitril-valsartan (ENTRESTO) 24-26 MG TAKE ONE TABLET BY MOUTH EVERY MORNING and TAKE ONE TABLET BY MOUTH EVERY EVENING 60 tablet 0   tamsulosin (FLOMAX) 0.4 MG CAPS capsule TAKE ONE CAPSULE BY MOUTH EVERY EVENING 30 capsule 5   No current facility-administered medications for this visit.    Allergies:   Patient has no known allergies.   Social History:  The patient  reports that he quit smoking about 37 years ago. His smoking use included cigarettes. He has a 34.00 pack-year smoking history. He has never used smokeless tobacco. He reports current alcohol use. He reports that he does not use  drugs.   Family History:  The patient's family history includes Diabetes in his brother; Heart attack (age of onset: 82) in his father.  ROS:  Please see the history of present illness.    All other systems are reviewed and otherwise negative.   PHYSICAL EXAM:  VS:  There were no vitals taken for this visit. BMI: There is no height or weight on file to calculate BMI. Well nourished, well developed, in no acute distress, has an unkept appearance HEENT: normocephalic, atraumatic Neck: no JVD, carotid bruits or masses Cardiac:  *** irreg-irreg; 1-2/6SM, no rubs, or gallops Lungs:   *** CTA b/l, no wheezing, rhonchi or rales Abd: soft, nontender MS: no deformity or atrophy Ext:  *** no edema Skin: warm and dry, no rash Neuro:  No gross deficits appreciated Psych: euthymic mood, full affect  *** ICD site is stable, no tethering or discomfort   EKG:  not done today  Device interrogation done today and reviewed by myself:  ***    08/30/2017: LHC Prox RCA lesion is 90% stenosed. Mid RCA lesion is 100% stenosed. Ost LM to Mid LM lesion is 85% stenosed. Ost LAD to Prox LAD lesion is 50% stenosed. Ost 1st Diag to 1st Diag lesion is 70% stenosed. Ost Ramus to Ramus lesion is 75% stenosed. Ost Cx to Prox Cx lesion is 99% stenosed. Ost 1st Mrg lesion is 100% stenosed. LV end diastolic pressure is normal.   1. Severe 3 vessel and left main obstructive CAD 2. Normal LVEDP   08/27/2017: TTE Study Conclusions  - Left ventricle: The cavity size was normal. Systolic function was    severely reduced. The estimated ejection fraction was in the    range of 25% to 30%. Diffuse hypokinesis. Features are consistent    with a pseudonormal left ventricular filling pattern, with    concomitant abnormal relaxation and increased filling pressure    (grade 2 diastolic dysfunction). Doppler parameters are    consistent with high ventricular filling pressure.  - Aortic valve: Transvalvular velocity  was within the normal range.    There was no stenosis. There was no regurgitation.  - Mitral valve: Transvalvular velocity was within the normal range.    There was no evidence  for stenosis. There was trivial    regurgitation.  - Left atrium: The atrium was mildly dilated.  - Right ventricle: The cavity size was normal. Wall thickness was    normal. Systolic function was normal.  - Tricuspid valve: There was trivial regurgitation.  - Pulmonary arteries: Systolic pressure was within the normal    range. PA peak pressure: 18 mm Hg (S).    02/09/2008: TTE SUMMARY   -  The left ventricle was mildly dilated. Overall left ventricular         systolic function was mildly decreased. Left ventricular         ejection fraction was estimated to be 40 %. There was         akinesis of the entire inferoseptal wall. There was akinesis         of the entire inferior wall. There was akinesis of the         basal-mid posterior wall.   -  The aortic valve was mildly calcified. There was mildly reduced         aortic valve leaflet excursion.   Recent Labs: 11/28/2021: ALT 9 03/16/2022: BUN 24; Creatinine, Ser 1.71; Hemoglobin 12.1; Platelets 167.0; Potassium 3.7; Sodium 136  06/29/2021: LDL Cholesterol 109; VLDL 37.6 11/28/2021: Cholesterol 266; Direct LDL 123.0; HDL 33.60; Total CHOL/HDL Ratio 8; Triglycerides 714.0 Triglyceride is over 400; calculations on Lipids are invalid.   CrCl cannot be calculated (Patient's most recent lab result is older than the maximum 21 days allowed.).   Wt Readings from Last 3 Encounters:  03/16/22 184 lb 8 oz (83.7 kg)  02/08/22 186 lb (84.4 kg)  12/27/21 190 lb (86.2 kg)     Other studies reviewed: Additional studies/records reviewed today include: summarized above  ASSESSMENT AND PLAN:  ICD Intact function, no programming changes made  VT Resumed on amiodarone Dec 2022 None noted  Paroxysmal Afib, AFlutter CHA2DS2Vasc is 6, on Eliquis, appropriately  dosed 6.7 % burden *** concerns of compliance  CAD No reported anginal symptoms *** He should get on board with general cardiology Son reports his PMD does his labs, and manages lipids  ICM Chronic CHF (systolic) *** No exam findings of volume OL *** CorVue looks OK  HTN ***  Medication non-compliance Discussed/urged more help with medication compliance ***     Disposition: ***   Current medicines are reviewed at length with the patient today.  The patient did not have any concerns regarding medicines.  Signed,  , PA-C 05/13/2022 6:36 PM     [facility] [address]  [address] [phone] (office)  [phone] (fax)  

## 2022-05-15 ENCOUNTER — Encounter: Payer: Medicare PPO | Admitting: Physician Assistant

## 2022-05-18 ENCOUNTER — Telehealth: Payer: Self-pay

## 2022-05-18 NOTE — Chronic Care Management (AMB) (Signed)
Chronic Care Management Pharmacy Assistant   Name: Randy [name]  MRN: [id number] DOB: 1945-08-06  Reason for Encounter: Medication Adherence and Delivery Coordination   Recent office visits:  None since last CCM contact  Recent consult visits:  None since last CCM contact  Hospital visits:  None in previous 6 months  Medications: Outpatient Encounter Medications as of 05/18/2022  Medication Sig   Accu-Chek Softclix Lancets lancets USE AS DIRECTED  TO TEST BLOOD SUGAR ONE TIME DAILY  AND AS NEEDED   Alcohol Swabs (B-D SINGLE USE SWABS REGULAR) PADS Use as instructed to clean area for glucose monitoring once daily and as needed.  Diagnosis:  E11.21  Non insulin-dependent   allopurinol (ZYLOPRIM) 100 MG tablet Take 0.5 tablets (50 mg total) by mouth daily.   amiodarone (PACERONE) 200 MG tablet Take 1 tablet (200 mg total) by mouth daily.   apixaban (ELIQUIS) 5 MG TABS tablet Take 1 tablet (5 mg total) by mouth 2 (two) times daily.   atorvastatin (LIPITOR) 80 MG tablet TAKE 1 TABLET (80 MG TOTAL) DAILY AT 6 PM.   Blood Glucose Calibration (ACCU-CHEK AVIVA) SOLN Use to calibrate blood glucose machine as recommended.  Diagnosis:  E11.21  Non-insulin dependent.   Blood Glucose Monitoring Suppl (ACCU-CHEK GUIDE) w/Device KIT Use to check blood sugar once daily.   carvedilol (COREG) 3.125 MG tablet TAKE ONE TABLET BY MOUTH TWICE DAILY   donepezil (ARICEPT) 5 MG tablet TAKE ONE TABLET BY MOUTH EVERY EVENING   ezetimibe (ZETIA) 10 MG tablet TAKE ONE TABLET BY MOUTH EVERY MORNING   finasteride (PROSCAR) 5 MG tablet TAKE ONE TABLET BY MOUTH EVERY MORNING   furosemide (LASIX) 20 MG tablet TAKE ONE TABLET BY MOUTH EVERY MORNING   gabapentin (NEURONTIN) 300 MG capsule TAKE TWO CAPSULES BY MOUTH TWICE DAILY   glipiZIDE (GLUCOTROL XL) 10 MG 24 hr tablet TAKE ONE TABLET BY MOUTH EVERY MORNING   glucose blood (ACCU-CHEK AVIVA PLUS) test strip Test Blood Sugar 1 time daily: Dx:E11.27    sacubitril-valsartan (ENTRESTO) 24-26 MG TAKE ONE TABLET BY MOUTH EVERY MORNING and TAKE ONE TABLET BY MOUTH EVERY EVENING   tamsulosin (FLOMAX) 0.4 MG CAPS capsule TAKE ONE CAPSULE BY MOUTH EVERY EVENING   No facility-administered encounter medications on file as of 05/18/2022.    BP Readings from Last 3 Encounters:  03/16/22 (!) 110/58  02/08/22 (!) 100/58  12/27/21 (!) 147/73    Lab Results  Component Value Date   HGBA1C 9.1 (A) 03/16/2022      No OVs, Consults, or hospital visits since last care coordination call / Pharmacist visit. No medication changes indicated   Last adherence delivery date:05/02/22      Patient is due for next adherence delivery on: 05/30/22  Spoke with patient on 05/18/22 reviewed medications and coordinated delivery. Spoke with son Randy [name] and reviewed all medication  This delivery to include: Adherence Packaging  30 Days  Packs: Carvedilol 3.125 mg - 1 BID (breakfast and evening meal) Gabapentin 300 mg - 2 BID (breakfast and evening meal) Furosemide 20 mg - 1 daily (breakfast) Allopurinol 100 mg - 1/2 tablet daily (breakfast) Tamsulosin 0.71m - 1 daily (evening meal) Finasteride 5 mg - 1 daily (breakfast) Atorvastatin 80 mg - 1 tablet (evening meal) Glipizide ER 10 mg - 1 daily  (breakfast) Zetia 10 mg- 1 tablet daily (breakfast)  Donepezil 519m-1 tablet (evening meal) Amiodarone 20010m 1 tablet (breakfast) Eliquis 5mg27mtake 1 tablet breakfast 1 tablet evening  meal   VIAL medications: none  Patient declined the following medications this month: none   Any concerns about your medications? No  not receiving Entresto from [employer].  How often do you forget or accidentally miss a dose? Rarely Son reports Randy [name] sometimes forgets to take the last doses of medications. Working on ways to clue the patient its time for evening medications   Do you use a pillbox? No  Is patient in packaging Yes  What is the date on your next pill pack?   Not at home around the medications  Any concerns or issues with your packaging? satisfied   No refill request needed.  Confirmed delivery date of 05/30/22, advised patient that pharmacy will contact them the morning of delivery.  Recent blood pressure readings are as follows:none available    Recent blood glucose readings are as follows:none available   Annual wellness visit in last year? No Most Recent BP reading:110/58  If Diabetic: Most recent A1C reading:9.1 Last eye exam / retinopathy screening:2021 Last diabetic foot exam:utd  Cycle dispensing form sent to [facility], CPP for review.  The son Randy [name] has been to the house and has not seen any Entresto around, he actually thought the entresto was included in the packs, I explained that we were getting Entresto for the patient through patient assistance. The son expressed continuing the assistance and I told him we would re-enroll the patient if reasonable with pharmacist and physician, Randy [name] understands.  Randy [name], CPP notified  Randy [name], [place]  [phone]

## 2022-05-21 ENCOUNTER — Ambulatory Visit: Payer: Self-pay | Admitting: *Deleted

## 2022-05-21 ENCOUNTER — Telehealth: Payer: Self-pay

## 2022-05-21 ENCOUNTER — Telehealth: Payer: Self-pay | Admitting: Pharmacist

## 2022-05-21 NOTE — Telephone Encounter (Signed)
**Note De-Identified  Obfuscation** We received a completed [employer] Pt Asst application with documents from [facility]. The patient signed and dated the application on 4/94/9447 but we received today.  I have completed the providers page of his application and have e-mailed all to Dr Tanna Furry nurse so he can print a Entresto 24-26 mg RX for #180 with 3 refills,have Dr Lovena Le sign/date it and the application, and to fax all to [employer] PAF at the fax number written on the cover letter included.

## 2022-05-21 NOTE — Telephone Encounter (Signed)
Patient brought Entresto patient assistance forms to PCP office. Dr Lovena Le (cardiology) is prescriber for [facility]. Faxed application to cardiology for patient's convenience.

## 2022-05-21 NOTE — Patient Outreach (Signed)
  Care Coordination   Follow Up Visit Note   05/21/2022 Name: Randy [name] MRN: [id number] DOB: 05/12/1946  Randy [name] is a 76 y.o. year old male who sees [name], Alyson Locket, NP for primary care. I spoke with  Randy [name] by phone today.  What matters to the patients health and wellness today?  Son will apply for Medicaid when he feels it is timetp pursue ALF/memory care. CSW signing off- son will reach out or ask PCP to  reconsult our team if needed.   Goals Addressed               This Visit's Progress     COMPLETED: Keeping Myself/Loved One Safe-Dementia (pt-stated)        Timeframe:  Short-Term Goal Priority:  High Start Date:   02/13/21                          Expected End Date:      02/25/22     Follow Up Date 01/19/22  - continue taking medications as prescribed  -Why is this important?   Safety is important when you or your loved one has dementia.  Eyesight, hearing and changes in feelings of hot and cold or depth-perception may occur.  Wandering or getting lost may happen.  You/your loved one may have to stop driving.  Taking steps to improve safety can prevent injuries.  It will also help you/your loved one feel more relaxed.  It will help you/your loved one be independent for a longer time.     Notes:         SDOH assessments and interventions completed:  Yes     Care Coordination Interventions Activated:  Yes  Care Coordination Interventions:  Yes, provided   Follow up plan: No further intervention required.   Encounter Outcome:  Pt. Visit Completed

## 2022-05-22 NOTE — Progress Notes (Signed)
No ICM remote transmission received for 05/21/2022 and next ICM transmission scheduled for 06/11/2022.

## 2022-05-24 MED ORDER — ENTRESTO 24-26 MG PO TABS: ORAL_TABLET | ORAL | 3 refills | Status: DC

## 2022-05-24 NOTE — Telephone Encounter (Signed)
Entresto 24-26 MG prescription, printed; Will have Dr. Lovena Le sign, and fax in.

## 2022-06-01 ENCOUNTER — Telehealth: Payer: Medicare PPO

## 2022-06-04 ENCOUNTER — Telehealth: Payer: Self-pay

## 2022-06-04 NOTE — Chronic Care Management (AMB) (Signed)
Chronic Care Management Pharmacy Assistant   Name: Randy [name]  MRN: [id number] DOB: 10/07/1945  Reason for Encounter: Reminder Call    Medications: Outpatient Encounter Medications as of 06/04/2022  Medication Sig   Accu-Chek Softclix Lancets lancets USE AS DIRECTED  TO TEST BLOOD SUGAR ONE TIME DAILY  AND AS NEEDED   Alcohol Swabs (B-D SINGLE USE SWABS REGULAR) PADS Use as instructed to clean area for glucose monitoring once daily and as needed.  Diagnosis:  E11.21  Non insulin-dependent   allopurinol (ZYLOPRIM) 100 MG tablet Take 0.5 tablets (50 mg total) by mouth daily.   amiodarone (PACERONE) 200 MG tablet Take 1 tablet (200 mg total) by mouth daily.   apixaban (ELIQUIS) 5 MG TABS tablet Take 1 tablet (5 mg total) by mouth 2 (two) times daily.   atorvastatin (LIPITOR) 80 MG tablet TAKE 1 TABLET (80 MG TOTAL) DAILY AT 6 PM.   Blood Glucose Calibration (ACCU-CHEK AVIVA) SOLN Use to calibrate blood glucose machine as recommended.  Diagnosis:  E11.21  Non-insulin dependent.   Blood Glucose Monitoring Suppl (ACCU-CHEK GUIDE) w/Device KIT Use to check blood sugar once daily.   carvedilol (COREG) 3.125 MG tablet TAKE ONE TABLET BY MOUTH TWICE DAILY   donepezil (ARICEPT) 5 MG tablet TAKE ONE TABLET BY MOUTH EVERY EVENING   ezetimibe (ZETIA) 10 MG tablet TAKE ONE TABLET BY MOUTH EVERY MORNING   finasteride (PROSCAR) 5 MG tablet TAKE ONE TABLET BY MOUTH EVERY MORNING   furosemide (LASIX) 20 MG tablet TAKE ONE TABLET BY MOUTH EVERY MORNING   gabapentin (NEURONTIN) 300 MG capsule TAKE TWO CAPSULES BY MOUTH TWICE DAILY   glipiZIDE (GLUCOTROL XL) 10 MG 24 hr tablet TAKE ONE TABLET BY MOUTH EVERY MORNING   glucose blood (ACCU-CHEK AVIVA PLUS) test strip Test Blood Sugar 1 time daily: Dx:E11.27   sacubitril-valsartan (ENTRESTO) 24-26 MG TAKE ONE TABLET BY MOUTH EVERY MORNING and TAKE ONE TABLET BY MOUTH EVERY EVENING   tamsulosin (FLOMAX) 0.4 MG CAPS capsule TAKE ONE CAPSULE BY MOUTH EVERY  EVENING   No facility-administered encounter medications on file as of 06/04/2022.   [name] was contacted to remind of upcoming telephone visit with Lindsey Foltanski on 06/07/22 at 3:00. Patient was reminded to have any blood glucose and blood pressure readings available for review at appointment.   Message was left reminding patient of appointment. Left message with son [name]   CCM referral has been placed prior to visit?  Yes   Star Rating Drugs: Medication:  Last Fill: Day Supply Atorvastatin 80mg 04/27/22 30 Glipizide 10mg 04/27/22 30   Entresto        need to re enroll with manufacturer   Lindsey Foltanski, CPP notified   , [facility] concierge  [phone] 

## 2022-06-05 NOTE — Telephone Encounter (Signed)
**Note De-Identified Apryl Brymer Obfuscation** Letter received Randy [name] fax from [employer] stating that they need the pts proof of income. The letter states that they have reached out to the pt for his POI. Pt ID: [id number]

## 2022-06-07 ENCOUNTER — Telehealth: Payer: Self-pay | Admitting: Pharmacist

## 2022-06-07 ENCOUNTER — Ambulatory Visit (INDEPENDENT_AMBULATORY_CARE_PROVIDER_SITE_OTHER): Payer: Medicare HMO | Admitting: Pharmacist

## 2022-06-07 DIAGNOSIS — I10 Essential (primary) hypertension: Secondary | ICD-10-CM

## 2022-06-07 DIAGNOSIS — E1121 Type 2 diabetes mellitus with diabetic nephropathy: Secondary | ICD-10-CM

## 2022-06-07 DIAGNOSIS — I5022 Chronic systolic (congestive) heart failure: Secondary | ICD-10-CM

## 2022-06-07 DIAGNOSIS — I480 Paroxysmal atrial fibrillation: Secondary | ICD-10-CM

## 2022-06-07 DIAGNOSIS — E782 Mixed hyperlipidemia: Secondary | ICD-10-CM

## 2022-06-07 NOTE — Telephone Encounter (Signed)
Reviewed medication adherence with patient's son Laverna Peace - adherence is still an issue; pt has pill packs organized into AM and PM meds, but memory issues lead to nonadherence. Patient's son is usually able to ensure he takes 1 pack/day (either AM or PM, never both) - therefore patient is missing at least half of medications every day. Adherence may improve by changing BID medications to QD options and taking all meds one time per day  BID medications include carvedilol, Entresto and Eliquis (prescribed by cardiology)  Pt has also been off Entresto for at least 3 months (Novartis PAP expired 02/2022, it is not clear when patient ran out); BP has been low in most recent OV (110/58, 100/58), pt does not have a BP monitor at home  Recommendations: -change carvedilol 3.125 mg to metoprolol succinate 12.5 mg daily  -change Eliquis to Xarelto 15 mg daily -Hold Entresto until BP can be monitored more consistently  Routing to cardiologist Dr Lovena Le for input.

## 2022-06-07 NOTE — Progress Notes (Signed)
Chronic Care Management Pharmacy Note  06/14/2022 Name:  Randy [name] MRN:  [id number] DOB:  1945-11-13  Summary: CCM F/U visit -Medication adherence is still an issue; pt has pill packs organized into AM and PM meds, but memory issues lead to nonadherence; patient's son is usually able to ensure he takes 1 pack/day (either AM or PM, never both) - therefore patient is missing at least half of medications every day; adherence may improve by changing BID medications to QD options and taking all meds one time per day -Pt has been off Entresto for at least 3 months (Novartis PAP expired 02/2022, it is not clear when patient ran out); BP has been low in most recent OV (110/58, 100/58), pt does not have a BP monitor at home -[name] reports he has not applied to Medicaid yet, but he is still planning to in order to help with ALF placement  Recommendations/Changes made from today's visit: -Recommend to change carvedilol 3.125 mg to metoprolol succinate 12.5 mg daily  -Recommend to change Eliquis to Xarelto 15 mg daily -Recommend to change gabapentin to 600 mg once daily (he is already missing half the dose every day anyway) -Hold Entresto until BP can be monitored more consistently -Consulting with cardiology for above - see phone note  Plan: -Pharmacist follow up televisit scheduled for 3 months -PCP appt 06/27/22 -EKG appt 06/14/22    Subjective: Randy [name] is an 76 y.o. year old male who is a primary patient of Ramin, Zoll, NP.  The CCM team was consulted for assistance with disease management and care coordination needs.    Engaged with patient by telephone for follow up visit in response to provider referral for pharmacy case management and/or care coordination services.   Consent to Services:  The patient was given information about Chronic Care Management services, agreed to services, and gave verbal consent prior to initiation of services.  Please see initial visit note  for detailed documentation.   Patient Care Team: Michela Pitcher, NP as PCP - General (Nurse Practitioner) Evans Lance, MD as PCP - Electrophysiology (Cardiology) Evans Lance, MD as Consulting Physician (Cardiology) Marybelle Killings, MD as Consulting Physician (Orthopedic Surgery) Charlton Haws, [facility] as Pharmacist (Pharmacist)  Recent office visits: 03/16/22 NP Romilda Garret OV: f/u - not checking BG or BP. A1c 9.1%. Discussed ALF and applying for Medicaid.  12/01/21 NP Matt Charmian Muff OV: f/u  lab results worsening 2/2 noncompliance. Discussed ALF.  11/28/21 NP Romilda Garret OV: f/u - borderline hypotension; advised BP monitoring at home; sent glucose testing supplies. A1c 10.0 (3 pts up); GFR dropped 20 pts 2/2 nonadherence to medications.  06/29/21 NP Matt Charmian Muff OV: chronic f/u; start Donepezil 5 mg daily HS.  Recent consult visits: 02/08/22 PA Tommye Standard (Cardiology): f/u - consider switching to Xarelto for once daily dosing. Looking into ALF.  07/06/21 PA Joesph July (Cardiology): f/u CHF, NSVT. Start amiodarone 200 mg daily (pt misses AM meds often, wanted amiodarone on board for long 1/2 life)  Hospital visits: None in previous 6 months   Objective:  Lab Results  Component Value Date   CREATININE 1.71 (H) 03/16/2022   BUN 24 (H) 03/16/2022   GFR 38.45 (L) 03/16/2022   GFRNONAA 23 (L) 06/10/2019   GFRAA 26 (L) 06/10/2019   NA 136 03/16/2022   K 3.7 03/16/2022   CALCIUM 8.8 03/16/2022   CO2 25 03/16/2022   GLUCOSE 220 (H) 03/16/2022    Lab  Results  Component Value Date/Time   HGBA1C 9.1 (A) 03/16/2022 11:12 AM   HGBA1C 10.0 (A) 11/28/2021 12:31 PM   HGBA1C 8.9 (H) 03/10/2021 10:25 AM   HGBA1C 7.9 (H) 05/30/2020 08:39 AM   HGBA1C 6.4 06/16/2018 09:19 AM   GFR 38.45 (L) 03/16/2022 11:38 AM   GFR 37.98 (L) 12/15/2021 10:51 AM   MICROALBUR 21.7 (H) 12/15/2021 10:51 AM   MICROALBUR 2.0 (H) 08/05/2019 09:36 AM    Last diabetic Eye exam:  Lab Results  Component Value  Date/Time   HMDIABEYEEXA No Retinopathy 07/14/2019 12:00 AM    Last diabetic Foot exam: No results found for: "HMDIABFOOTEX"   Lab Results  Component Value Date   CHOL 266 (H) 11/28/2021   HDL 33.60 (L) 11/28/2021   LDLCALC 109 (H) 06/29/2021   LDLDIRECT 123.0 11/28/2021   TRIG (H) 11/28/2021    714.0 Triglyceride is over 400; calculations on Lipids are invalid.   CHOLHDL 8 11/28/2021       Latest Ref Rng & Units 11/28/2021    1:04 PM 06/29/2021   11:54 AM 03/10/2021   10:25 AM  Hepatic Function  Total Protein 6.0 - 8.3 g/dL 7.2  7.0  7.1   Albumin 3.5 - 5.2 g/dL 4.3  3.9  4.4   AST 0 - 37 U/L _0 ALT 0 - 53 U/L _1 Alk Phosphatase 39 - 117 U/L 80  57  62   Total Bilirubin 0.2 - 1.2 mg/dL 0.4  0.5  0.6     Lab Results  Component Value Date/Time   TSH 1.45 03/10/2021 10:25 AM   TSH 0.53 06/16/2018 09:35 AM       Latest Ref Rng & Units 03/16/2022   11:38 AM 11/28/2021    1:04 PM 06/29/2021   11:54 AM  CBC  WBC 4.0 - 10.5 K/uL 6.7  9.1  6.9   Hemoglobin 13.0 - 17.0 g/dL 12.1  12.5  11.4   Hematocrit 39.0 - 52.0 % 36.7  37.2  34.7   Platelets 150.0 - 400.0 K/uL 167.0  205.0  204.0     Lab Results  Component Value Date/Time   VD25OH 19.27 (L) 11/28/2021 01:04 PM   VD25OH 31.61 03/10/2021 10:25 AM    Clinical ASCVD: Yes  The ASCVD Risk score (Arnett DK, et al., 2019) failed to calculate for the following reasons:   The patient has a prior MI or stroke diagnosis        12/13/2021    3:03 PM 03/10/2021    8:08 AM 11/20/2019    2:57 PM  Depression screen PHQ 2/9  Decreased Interest 0 0 0  Down, Depressed, Hopeless 0 0 0  PHQ - 2 Score 0 0 0  Altered sleeping   0  Tired, decreased energy   0  Change in appetite   0  Feeling bad or failure about yourself    0  Trouble concentrating   0  Moving slowly or fidgety/restless   0  Suicidal thoughts   0  PHQ-9 Score   0  Difficult doing work/chores   Not difficult at all     Social History   Tobacco  Use  Smoking Status Former   Packs/day: 2.00   Years: 17.00   Total pack years: 34.00   Types: Cigarettes   Quit date: 07/30/1984   Years since quitting: 37.8  Smokeless Tobacco Never   BP Readings from Last  3 Encounters:  06/14/22 122/62  03/16/22 (!) 110/58  02/08/22 (!) 100/58   Pulse Readings from Last 3 Encounters:  06/14/22 73  03/16/22 73  02/08/22 71   Wt Readings from Last 3 Encounters:  06/14/22 186 lb 3.2 oz (84.5 kg)  03/16/22 184 lb 8 oz (83.7 kg)  02/08/22 186 lb (84.4 kg)   BMI Readings from Last 3 Encounters:  06/14/22 27.50 kg/m  03/16/22 27.25 kg/m  02/08/22 27.47 kg/m    Assessment/Interventions: Review of patient past medical history, allergies, medications, health status, including review of consultants reports, laboratory and other test data, was performed as part of comprehensive evaluation and provision of chronic care management services.   SDOH:  (Social Determinants of Health) assessments and interventions performed: Yes SDOH Interventions    Flowsheet Row Chronic Care Management from 10/17/2021 in [place] [facility] [place] Management from 07/14/2021 in [place] at [place] Management from 05/24/2021 in [place] [facility] [place] from 04/22/2017 in [place] at [place] Interventions Intervention Not Indicated -- -- --  Housing Interventions -- Intervention Not Indicated -- --  Transportation Interventions -- -- Intervention Not Indicated --  Depression Interventions/Treatment  -- -- -- Merilyn Baba to PCP ]  Financial Strain Interventions Intervention Not Indicated -- -- --       [place]: No Food Insecurity (10/17/2021)  Housing: Low Risk  (07/14/2021)  Transportation Needs: No Transportation Needs (05/24/2021)  Depression (PHQ2-9): Low Risk  (12/13/2021)  Financial Resource Strain: Low  Risk  (10/17/2021)  Tobacco Use: Medium Risk (06/14/2022)    [place]  No Known Allergies  Medications Reviewed Today     Reviewed by Danielle Dess, [place] (Certified Medical Assistant) on 06/14/22 at [id number]  Med List Status: <None>   Medication Order Taking? Sig Documenting Provider Last Dose Status Informant  Accu-Chek Softclix Lancets lancets [id number] Yes USE AS DIRECTED  TO TEST BLOOD SUGAR ONE TIME DAILY  AND AS NEEDED Elby Beck, FNP Taking Active   Alcohol Swabs (B-D SINGLE USE SWABS REGULAR) PADS [id number] Yes Use as instructed to clean area for glucose monitoring once daily and as needed.  Diagnosis:  E11.21  Non insulin-dependent Tonia Ghent, MD Taking Active Self  allopurinol (ZYLOPRIM) 100 MG tablet [id number] Yes Take 0.5 tablets (50 mg total) by mouth daily. Michela Pitcher, NP Taking Active   amiodarone (PACERONE) 200 MG tablet [id number] Yes Take 1 tablet (200 mg total) by mouth daily. Shirley Friar, PA-C Taking Active   apixaban (ELIQUIS) 5 MG TABS tablet [id number] Yes Take 1 tablet (5 mg total) by mouth 2 (two) times daily. Evans Lance, MD Taking Active   atorvastatin (LIPITOR) 80 MG tablet [id number] Yes TAKE 1 TABLET (80 MG TOTAL) DAILY AT 6 PM. Evans Lance, MD Taking Active   Blood Glucose Calibration ([place]) SOLN [id number] Yes Use to calibrate blood glucose machine as recommended.  Diagnosis:  E11.21  Non-insulin dependent. Tonia Ghent, MD Taking Active Self  Blood Glucose Monitoring Suppl (ACCU-CHEK GUIDE) w/Device KIT [id number] Yes Use to check blood sugar once daily. Elby Beck, FNP Taking Active   carvedilol (COREG) 3.125 MG tablet [id number] Yes TAKE ONE TABLET BY MOUTH TWICE DAILY Evans Lance, MD Taking Active   donepezil (ARICEPT) 5 MG tablet [id number] Yes TAKE ONE TABLET BY MOUTH EVERY EVENING [name],  Alyson Locket, NP Taking Active   ezetimibe (ZETIA) 10 MG tablet [id number] Yes TAKE ONE TABLET BY MOUTH EVERY MORNING  Michela Pitcher, NP Taking Active   finasteride (PROSCAR) 5 MG tablet [id number] Yes TAKE ONE TABLET BY MOUTH EVERY MORNING Michela Pitcher, NP Taking Active   furosemide (LASIX) 20 MG tablet [id number] Yes TAKE ONE TABLET BY MOUTH EVERY MORNING Michela Pitcher, NP Taking Active   gabapentin (NEURONTIN) 300 MG capsule [id number] Yes TAKE TWO CAPSULES BY MOUTH TWICE DAILY Michela Pitcher, NP Taking Active   glipiZIDE (GLUCOTROL XL) 10 MG 24 hr tablet [id number] Yes TAKE ONE TABLET BY MOUTH EVERY MORNING Michela Pitcher, NP Taking Active   glucose blood (ACCU-CHEK AVIVA PLUS) test strip [id number] Yes Test Blood Sugar 1 time daily: Dx:E11.27 Elby Beck, FNP Taking Active   sacubitril-valsartan [facility]) 24-26 [facility] [id number] Yes TAKE ONE TABLET BY MOUTH EVERY MORNING and TAKE ONE TABLET BY MOUTH EVERY EVENING Evans Lance, MD Taking Active            Med Note Rolan Bucco Jun 07, 2022  3:26 PM) Novartis PAP expired 02/2022. Unclear when patient's supply ran out.  tamsulosin (FLOMAX) 0.4 MG CAPS capsule [id number] Yes TAKE ONE CAPSULE BY MOUTH EVERY EVENING Michela Pitcher, NP Taking Active             Patient Active Problem List   Diagnosis Date Noted   Decreased renal function 12/01/2021   Open wnd of finger 11/28/2021   Mild cognitive impairment 03/10/2021   Mass of right foot 03/10/2021   Idiopathic gout of multiple sites 10/07/2018   Grief 06/16/2018   Dizziness 06/16/2018   Allergic rhinitis 04/24/2018   S/P CABG x 3 09/02/2017   Leukocytosis 08/27/2017   Visit for well man health check 05/07/2017   COPD (chronic obstructive pulmonary disease) ([place]) 88/50/2774   Chronic systolic CHF (congestive heart failure) ([place]) 10/18/2016   Generalized OA 06/22/2014   Hypotension due to drugs 03/22/2014   CKD (chronic kidney disease) stage 3, GFR 30-59 ml/min (HCC)    Synovial cyst of lumbar spine 07/17/2013    Class: Diagnosis of   Osteoarthritis of left hip 09/17/2012    Automatic implantable cardioverter-defibrillator in situ - St Jude 12/87/8676   Chronic systolic heart failure ([place]) 05/01/2011   IMPOTENCE OF ORGANIC ORIGIN 02/20/2010   Vitamin D deficiency 02/14/2010   Type 2 diabetes with nephropathy ([place]) 01/11/2010   Implantable cardioverter-defibrillator (ICD) in situ 05/10/2009   HLD (hyperlipidemia) 02/24/2008   Essential hypertension 02/24/2008   Non-ST elevation (NSTEMI) myocardial infarction ([place]) 02/24/2008   HYPERSOMNIA UNSPECIFIED 02/24/2008    Immunization History  Administered Date(s) Administered   Fluad Quad(high Dose 65+) 04/28/2019, 05/30/2020   Influenza Split 06/02/2012   Influenza Whole 05/18/2010   Influenza,inj,Quad PF,6+ Mos 04/23/2013, 03/22/2014, 07/07/2015, 06/13/2016, 04/22/2017, 03/31/2018   Influenza-Unspecified 05/07/2019   PFIZER(Purple Top)SARS-COV-2 Vaccination 09/22/2019, 10/13/2019, 12/04/2019, 05/22/2021   Pneumococcal Conjugate-13 06/22/2014   Pneumococcal Polysaccharide-23 03/09/2016   Td 01/14/2008   Zoster Recombinat (Shingrix) 07/09/2019   Zoster, Live 01/11/2010    Conditions to be addressed/monitored:  Hypertension, Hyperlipidemia, Diabetes, Atrial Fibrillation, Heart Failure, Coronary Artery Disease, and Chronic Kidney Disease  Care Plan : CCM Pharmacy Care Plan  Updates made by Charlton Haws, [place] since 06/14/2022 12:00 AM     Problem: Hypertension, Hyperlipidemia, Diabetes, Atrial Fibrillation, Heart Failure, Coronary Artery Disease, and Chronic Kidney Disease   Priority: High  Long-Range Goal: Disease mgmt   Start Date: 10/18/2021  Expected End Date: 10/19/2022  This Visit's Progress: Not on track  Recent Progress: Not on track  Priority: High  Note:   Current Barriers:  Unable to independently monitor therapeutic efficacy Unable to self administer medications as prescribed  Pharmacist Clinical Goal(s):  Patient will achieve adherence to monitoring guidelines and medication  adherence to achieve therapeutic efficacy achieve ability to self administer medications as prescribed through use of pill packs as evidenced by patient report through collaboration with PharmD and provider.   Interventions: 1:1 collaboration with Michela Pitcher, NP regarding development and update of comprehensive plan of care as evidenced by provider attestation and co-signature Inter-disciplinary care team collaboration (see longitudinal plan of care) Comprehensive medication review performed; medication list updated in electronic medical record  Hypertension / NSVT (BP goal <140/90) -Controlled - BP low-normal in clinic; he is not taking Entresto currently (PAP expired probably Aug 2023), it is unclear how long he has been off of this - concern that restarting it may drop BP too low -Last ejection fraction: 25-30% (Date: 07/2017) -HF type: Systolic; NYHA Class: II-III -Current treatment: Carvedilol 3.125 mg BID - Appropriate, Effective, Safe, Accessible Furosemide 20 mg daily - Appropriate, Effective, Safe, Accessible Entresto 24-26 mg BID  - not taking (PAP expired) -Medications previously tried: n/a  -Denies hypotensive/hypertensive symptoms -Educated on BP goals and benefits of medications for prevention of heart attack, stroke and kidney damage; -Counseled to monitor BP at home periodically -Recommended to consolidate BID meds into QD meds - recommend to change carvedilol to metoprolol succinate; hold enresto - consulting with cardiology  Hyperlipidemia: (LDL goal < 70) -Not ideally controlled - LDL 123 (11/2021) above goal and worsened since 06/2021, likely due to adherence issues -  compliance with all medications is an issue due to dementia -Hx NSTEMI -Current treatment: Atorvastatin 80 mg daily - Appropriate, Query Effective Ezetimibe 10 mg daily -Appropriate, Query Effective -Medications previously tried: n/a  -Educated on Cholesterol goals; Benefits of statin for ASCVD risk  reduction; -Recommended to continue current medication; encouraged compliance with pill packs  Atrial Fibrillation (Goal: prevent stroke and major bleeding) -Query controlled - med compliance is an issue; concern that patient forgets bedtime doses so is missing 2nd dose of Eliquis often, would consider switching to Xarelto for once daily administration -CHADSVASC: 6 (age2, male CHF, HTN, CAD, DM) -Current treatment: Amiodarone 200 mg daily - Appropriate, Effective, Safe, Accessible Carvedilol 3.125 mg BID - Appropriate, Effective, Safe, Accessible Eliquis 5 mg BID - Appropriate, Query Effective -Medications previously tried: n/a -Counseled on increased risk of stroke due to Afib and benefits of anticoagulation for stroke prevention; -Recommend to continue current medication; recommend switching Eliquis to Xarelto - consulting with cardiology  Diabetes (A1c goal <8%) -Controlled - A1c 9.1% (02/2022) improved somewhat from 10.0% (11/2021) -Current medications: Glipizide XL 10 mg daily AM - Appropriate, Effective, Safe, Accessible -Medications previously tried: metformin  -Educated on A1c and blood sugar goals; -Counseled to check feet daily and get yearly eye exams -Recommended to continue current medication; consider weekly injection GLP1-RA that can be administered by family to improve compliance  Cognitive impairment (Goal: slow progression) -Query controlled - memory issues are impacting health conditions (DM is worse, blood thinner likely ineffective, etc) -Current treatment  Donepezil 5 mg daily HS - Appropriate, Effective, Safe, Accessible -Medications previously tried: n/a  -Recommended to continue current medication  Health Maintenance -Overall questionable medication adherence even with pill packs due to  memory issues; per son, patient often forgets to take the packs even though the box is placed on his side table near his chair in living room -Encouraged son to monitor  medication adherence as able  Patient Goals/Self-Care Activities Patient will:  - take medications as prescribed as evidenced by patient report and record review focus on medication adherence by pill packs       Medication Assistance: None required.  Patient affirms current coverage meets needs.  Compliance/Adherence/Medication fill history: Care Gaps: Colonoscopy (due 02/07/20) Eye exam (due 07/13/20) Foot exam (due 11/19/20, ordered 06/29/21)  Star-Rating Drugs: Atorvastatin - PDC 100% Glipizide - PDC 100%  Medication Access: Within the past 30 days, how often has patient missed a dose of medication? 25-50% of doses Is a pillbox or other method used to improve adherence? Yes  Factors that may affect medication adherence?  Dementia Are meds synced by current pharmacy? Yes  Are meds delivered by current pharmacy? Yes  Does patient experience delays in picking up medications due to transportation concerns? No   [facility] Reviewed: Is patient disadvantaged to use [facility]?: No  Current Rx insurance plan: [employer] Name and location of Current pharmacy:  [facility] - [place], [place] - [address] [place] [address] Phone: [phone] Fax: [phone]  [facility] services reviewed with patient today?: Yes  30-day Packs (Delivery 05/31/22) Carvedilol 3.125 mg - 1 BID (breakfast and evening meal) Gabapentin 300 mg - 2 BID (breakfast and evening meal) Furosemide 20 mg - 1 daily (breakfast) Allopurinol 100 mg - 1/2 tablet daily (breakfast) Tamsulosin 0.39m - 1 daily (evening meal) Finasteride 5 mg - 1 daily (breakfast) Atorvastatin 80 mg - 1 tablet (evening meal) Glipizide ER 10 mg - 1 daily  (breakfast) Zetia 10 mg- 1 tablet daily (breakfast)  Donepezil 527m-1 tablet (evening meal) Amiodarone 20071m 1 tablet (breakfast) Eliquis 5mg15mtake 1 tablet breakfast 1 tablet evening meal    Manufacturer: Entresto - pending approval. Needs proof of income (as of 06/05/22)   Care Plan and Follow Up Patient Decision:  Patient agrees to Care Plan and Follow-up.  Plan: Telephone follow up appointment with care management team member scheduled for:  3 months  LindCharlene BrookearmD, BCACP Clinical Pharmacist [facility]-[phone]

## 2022-06-11 ENCOUNTER — Ambulatory Visit (INDEPENDENT_AMBULATORY_CARE_PROVIDER_SITE_OTHER): Payer: Medicare HMO

## 2022-06-11 DIAGNOSIS — Z95810 Presence of automatic (implantable) cardiac defibrillator: Secondary | ICD-10-CM

## 2022-06-11 DIAGNOSIS — I5022 Chronic systolic (congestive) heart failure: Secondary | ICD-10-CM

## 2022-06-12 NOTE — Progress Notes (Unsigned)
Cardiology Office Note Date:  06/14/2022  Patient ID:  Randy [name], DOB April 22, 1946, MRN [id number] PCP:  Michela Pitcher, NP  Electrophysiologist: Dr. Lovena Le    Chief Complaint: planned f/u, VT  History of Present Illness: Randy [name] is a 77 y.o. male with history of ICM, chronic CHF (systolic), VT, AFib, CAD (CABG 2019), CKD (III), HTN, HLD, DM  He comes in today to be seen for Dr. Lovena Le, last seen by him June 2021, no VT/maintaining SR off amiodarone, no changes were made with class II symptoms  He saw [name] Dec 2022 he had been shocked by his ICD several weeks prior, no clear syncope or symptoms, amiodarone was resumed, his son reported he often missed his AM meds.  NO-showed to 2 scheduled f/u visits  Device remote ATPs felt to be inappropriate in review with EP in the office, for rapid AFlutter, called to come in.    I saw him 02/08/22 He comes accompanied by his son. The patient resides independently, is known to have some degree of dementia, no longer drives/goes out alone, admittedly he agrees with his son, would likely get lost. He feels like for the most part he gets his AM meds taken perhaps not 100%, but more often then not, his PM/afternoon medicines perhaps infrequent at least, perhaps rarely, says he just forgets, it is not an intentional non-compliance with medicines. His son says that when they find un-taken pills, and the pt can not recall if he took others or not and worry about over dosing pills, so they go ahead and skip. The patient denies any symptoms As far as the don knows, he has never complained to him about anything/any symptoms No reports of falls or faints No CP, SOB He does not exercise, lives a pretty sedentary life, but denies symptoms or difficulties with ADLs  He was in AFlutter that had just started that day, no further/new treated episodes Felt medication noncompliance driving/contributing perhaps difficulty with rhythm  management Discussed strategies.reminders to help with meds His son did later call and confirm amiodarone was in his pill pack Discussed need for gen cards for his other cardiac management. Questioned perhaps change to xarelto to try and avoid BID meds to help with compliance  03/21/22 further ATP tx for rapid AFlutter, presenting rhythm was AS/VS d/w Dr. Lovena Le, no med changes  04/20/22, TRUE VT episodes (x2), both success with ATP, device RN d/w pt's son, no reported symptoms to him at least, revisited likely missed meds.  Looks like [facility] social worker has rechout unable to get the patient.  Now getting pill packs done, though still missing PM doses, RPH with his PMD reached out asking if any of his BID meds can be changed to daily to help with medication adherence. Also noted that he had been out of.not taking Entresto, perhaps since August though unclear.  Did not typically lower BPs last few visits Recs by them were to try  change carvedilol 3.125 mg to metoprolol succinate 12.5 mg daily  -change Eliquis to Xarelto 15 mg daily -Hold Entresto until BP can be monitored more consistently  TODAY He is accompanied by his son. He admits to poor memory, they bot estimate perhaps 50% medication compliance. He denies any kind of symptoms, does think he would remember symptoms if he had them, he has never mentioned any concerns or symptoms to his son. No CP, palpitations or cardiac awareness No dizzy spells, near syncope or syncope No bleeding or  signs of bleeding   Device information Abbott single chamber ICD implanted 05/20/2008 >> gen change and addition of RA lead 10/18/2016 + appropriate therapy  Amiodarone stopped 2021 after had run out and been off come months it seem (unclear)  Dec 2022 with VT treated w/HV tx. Amiodarone restarted Dec 2022   Has had inappropriate ATP as well for RVR  Past Medical History:  Diagnosis Date   AICD (automatic cardioverter/defibrillator) present  10/18/2016   "replaced 2009-[employer] device, Dr Lovena Le"   Anemia    Arthritis    "back" (08/27/2017)   CAD (coronary artery disease)    CHF (congestive heart failure) (HCC)    CKD (chronic kidney disease) stage 3, GFR 30-59 ml/min (HCC)    [name]   Diverticulosis    Dyslipidemia    GERD (gastroesophageal reflux disease)    Hypertension    Neuromuscular disorder (HCC)    neuropathy- in hands   Osteoarthritis    hips, hands   Pacemaker    Shortness of breath    Sleep apnea    started a sleep study but didn't completed, states PCP told him he has sleep  apnea, doesn't use CPAP   Systolic dysfunction    Type 2 diabetes with nephropathy (HCC)    Vitamin D deficiency     Past Surgical History:  Procedure Laterality Date   ANTERIOR CERVICAL DECOMP/DISCECTOMY FUSION     BACK SURGERY     CARPAL TUNNEL RELEASE Left    CORONARY ARTERY BYPASS GRAFT N/A 09/02/2017   Procedure: CORONARY ARTERY BYPASS GRAFTING (CABG) x three , using left internal mammary artery and right leg greater saphenous vein harvested endoscopically;  Surgeon: [name], MD;  Location: [facility];  Service: Open Heart Surgery;  Laterality: N/A;   CYST EXCISION     "back"   ICD IMPLANT N/A 10/18/2016   Procedure: ICD Implant- Upgrade to Dual Chamber;  Surgeon: Evans Lance, MD;  Location: [place] [facility];  Service: Cardiovascular;  Laterality: N/A;   INSERT / REPLACE / REMOVE PACEMAKER     JOINT REPLACEMENT     LEFT HEART CATH AND CORONARY ANGIOGRAPHY N/A 08/30/2017   Procedure: LEFT HEART CATH AND CORONARY ANGIOGRAPHY;  Surgeon: Martinique, Peter M, MD;  Location: [facility];  Service: Cardiovascular;  Laterality: N/A;   LUMBAR LAMINECTOMY Left 07/17/2013   Procedure: MICRODISCECTOMY LUMBAR LAMINECTOMY;  Surgeon: Marybelle Killings, MD;  Location: [facility];  Service: Orthopedics;  Laterality: Left;  Left L4 Laminotomy, Lateral Recess Decompression, Cyst Excision   SHOULDER OPEN ROTATOR CUFF REPAIR Left    TEE WITHOUT  CARDIOVERSION N/A 09/02/2017   Procedure: TRANSESOPHAGEAL ECHOCARDIOGRAM (TEE);  Surgeon: [name], MD;  Location: [facility];  Service: Open Heart Surgery;  Laterality: N/A;   TOTAL HIP ARTHROPLASTY Left 09/17/2012   Procedure: Left TOTAL HIP ARTHROPLASTY ANTERIOR APPROACH;  Surgeon: Marybelle Killings, MD;  Location: [facility];  Service: Orthopedics;  Laterality: Left;    Current Outpatient Medications  Medication Sig Dispense Refill   Accu-Chek Softclix Lancets lancets USE AS DIRECTED  TO TEST BLOOD SUGAR ONE TIME DAILY  AND AS NEEDED 200 each 2   Alcohol Swabs (B-D SINGLE USE SWABS REGULAR) PADS Use as instructed to clean area for glucose monitoring once daily and as needed.  Diagnosis:  E11.21  Non insulin-dependent 100 each 3   allopurinol (ZYLOPRIM) 100 MG tablet Take 0.5 tablets (50 mg total) by mouth daily. 15 tablet 2   amiodarone (  PACERONE) 200 MG tablet Take 1 tablet (200 mg total) by mouth daily. 90 tablet 3   atorvastatin (LIPITOR) 80 MG tablet TAKE 1 TABLET (80 MG TOTAL) DAILY AT 6 PM. 90 tablet 3   Blood Glucose Calibration (ACCU-CHEK AVIVA) SOLN Use to calibrate blood glucose machine as recommended.  Diagnosis:  E11.21  Non-insulin dependent. 1 each 2   Blood Glucose Monitoring Suppl (ACCU-CHEK GUIDE) w/Device KIT Use to check blood sugar once daily. 1 kit 0   carvedilol (COREG) 3.125 MG tablet TAKE ONE TABLET BY MOUTH TWICE DAILY 180 tablet 3   donepezil (ARICEPT) 5 MG tablet TAKE ONE TABLET BY MOUTH EVERY EVENING 90 tablet 1   ezetimibe (ZETIA) 10 MG tablet TAKE ONE TABLET BY MOUTH EVERY MORNING 90 tablet 2   finasteride (PROSCAR) 5 MG tablet TAKE ONE TABLET BY MOUTH EVERY MORNING 30 tablet 5   furosemide (LASIX) 20 MG tablet TAKE ONE TABLET BY MOUTH EVERY MORNING 30 tablet 2   gabapentin (NEURONTIN) 300 MG capsule TAKE TWO CAPSULES BY MOUTH TWICE DAILY 120 capsule 2   glipiZIDE (GLUCOTROL XL) 10 MG 24 hr tablet TAKE ONE TABLET BY MOUTH EVERY MORNING 90 tablet 1   glucose blood  (ACCU-CHEK AVIVA PLUS) test strip Test Blood Sugar 1 time daily: Dx:E11.27 100 each 3   losartan (COZAAR) 25 MG tablet Take 1 tablet (25 mg total) by mouth daily. 30 tablet 6   metoprolol succinate (TOPROL XL) 25 MG 24 hr tablet Take 1 tablet (25 mg total) by mouth daily. 30 tablet 6   Rivaroxaban (XARELTO) 15 MG TABS tablet Take 1 tablet (15 mg total) by mouth daily with supper. 30 tablet 6   tamsulosin (FLOMAX) 0.4 MG CAPS capsule TAKE ONE CAPSULE BY MOUTH EVERY EVENING 30 capsule 5   No current facility-administered medications for this visit.    Allergies:   Patient has no known allergies.   Social History:  The patient  reports that he quit smoking about 37 years ago. His smoking use included cigarettes. He has a 34.00 pack-year smoking history. He has never used smokeless tobacco. He reports current alcohol use. He reports that he does not use drugs.   Family History:  The patient's family history includes Diabetes in his brother; Heart attack (age of onset: 37) in his father.  ROS:  Please see the history of present illness.    All other systems are reviewed and otherwise negative.   PHYSICAL EXAM:  VS:  BP 122/62   Pulse 73   Ht _0  (1.753 m)   Wt 186 lb 3.2 oz (84.5 kg)   SpO2 96%   BMI 27.50 kg/m  BMI: Body mass index is 27.5 kg/m. Well nourished, well developed, in no acute distress, has an unkept appearance HEENT: normocephalic, atraumatic Neck: no JVD, carotid bruits or masses Cardiac:  RRR; 1-2/6SM, no rubs, or gallops Lungs:    CTA b/l, no wheezing, rhonchi or rales Abd: soft, nontender MS: no deformity or atrophy Ext:  no edema Skin: warm and dry, no rash Neuro:  No gross deficits appreciated Psych: euthymic mood, full affect  ICD site is stable, no tethering or discomfort   EKG:  done today and reviewed by myself SR 73bpm  Device interrogation done today and reviewed by myself:  Battery and lead measurements are stable RV lead impedance is on a slow  upwards trend, still ok No recurrent VT (since Sept)  AF burden 6.5%   08/30/2017: LHC Prox RCA lesion is  90% stenosed. Mid RCA lesion is 100% stenosed. Ost LM to Mid LM lesion is 85% stenosed. Ost LAD to Prox LAD lesion is 50% stenosed. Ost 1st Diag to 1st Diag lesion is 70% stenosed. Ost Ramus to Ramus lesion is 75% stenosed. Ost Cx to Prox Cx lesion is 99% stenosed. Ost 1st Mrg lesion is 100% stenosed. LV end diastolic pressure is normal.   1. Severe 3 vessel and left main obstructive CAD 2. Normal LVEDP   08/27/2017: TTE Study Conclusions  - Left ventricle: The cavity size was normal. Systolic function was    severely reduced. The estimated ejection fraction was in the    range of 25% to 30%. Diffuse hypokinesis. Features are consistent    with a pseudonormal left ventricular filling pattern, with    concomitant abnormal relaxation and increased filling pressure    (grade 2 diastolic dysfunction). Doppler parameters are    consistent with high ventricular filling pressure.  - Aortic valve: Transvalvular velocity was within the normal range.    There was no stenosis. There was no regurgitation.  - Mitral valve: Transvalvular velocity was within the normal range.    There was no evidence for stenosis. There was trivial    regurgitation.  - Left atrium: The atrium was mildly dilated.  - Right ventricle: The cavity size was normal. Wall thickness was    normal. Systolic function was normal.  - Tricuspid valve: There was trivial regurgitation.  - Pulmonary arteries: Systolic pressure was within the normal    range. PA peak pressure: 18 mm Hg (S).    02/09/2008: TTE SUMMARY   -  The left ventricle was mildly dilated. Overall left ventricular         systolic function was mildly decreased. Left ventricular         ejection fraction was estimated to be 40 %. There was         akinesis of the entire inferoseptal wall. There was akinesis         of the entire inferior wall.  There was akinesis of the         basal-mid posterior wall.   -  The aortic valve was mildly calcified. There was mildly reduced         aortic valve leaflet excursion.   Recent Labs: 11/28/2021: ALT 9 03/16/2022: BUN 24; Creatinine, Ser 1.71; Hemoglobin 12.1; Platelets 167.0; Potassium 3.7; Sodium 136  06/29/2021: LDL Cholesterol 109; VLDL 37.6 11/28/2021: Cholesterol 266; Direct LDL 123.0; HDL 33.60; Total CHOL/HDL Ratio 8; Triglycerides 714.0 Triglyceride is over 400; calculations on Lipids are invalid.   CrCl cannot be calculated (Patient's most recent lab result is older than the maximum 21 days allowed.).   Wt Readings from Last 3 Encounters:  06/14/22 186 lb 3.2 oz (84.5 kg)  03/16/22 184 lb 8 oz (83.7 kg)  02/08/22 186 lb (84.4 kg)     Other studies reviewed: Additional studies/records reviewed today include: summarized above  ASSESSMENT AND PLAN:  ICD Intact function no programming changes made Follow RV lead  VT Resumed on amiodarone Dec 2022 Unfortunately had more VT but also not taking his meds regularly  Hopefully we can get better medication compliance (discussed below), if he has more VT and actually taking his medicines, I think we need to think about a cath  Paroxysmal Afib, AFlutter CHA2DS2Vasc is 6, on Eliquis, appropriately dosed 6.5 % burden  CAD No reported anginal symptoms Son reports his PMD does his labs, and manages  lipids  ICM Chronic CHF (systolic) No exam findings of volume OL CorVue looks OK  HTN Reportedly with some lower BPs by notes/RPH, today looks OK here  Medication non-compliance Discussed/urged more help with medication compliance Agree in trying to simplify his meds to as many to daily dosing as able  Change  Eliquis > Xarelto 62m daily Coreg > Toprol 212mdaily Entresto > losartan 2523maily   Discussed with the pt and his son I will send his note to the primary's RPH      Disposition: have him back in a month or  so, sooner if needed   Current medicines are reviewed at length with the patient today.  The patient did not have any concerns regarding medicines.  SigVenetia NightA-C 06/14/2022 12:47 PM     [facility] [phone])  [phone])

## 2022-06-14 ENCOUNTER — Encounter: Payer: Self-pay | Admitting: Physician Assistant

## 2022-06-14 ENCOUNTER — Ambulatory Visit: Payer: Medicare HMO | Attending: Physician Assistant | Admitting: Physician Assistant

## 2022-06-14 VITALS — BP 122/62 | HR 73 | Ht 69.0 in | Wt 186.2 lb

## 2022-06-14 DIAGNOSIS — I5022 Chronic systolic (congestive) heart failure: Secondary | ICD-10-CM | POA: Diagnosis not present

## 2022-06-14 DIAGNOSIS — I472 Ventricular tachycardia, unspecified: Secondary | ICD-10-CM

## 2022-06-14 DIAGNOSIS — I2510 Atherosclerotic heart disease of native coronary artery without angina pectoris: Secondary | ICD-10-CM

## 2022-06-14 DIAGNOSIS — Z79899 Other long term (current) drug therapy: Secondary | ICD-10-CM | POA: Diagnosis not present

## 2022-06-14 DIAGNOSIS — I255 Ischemic cardiomyopathy: Secondary | ICD-10-CM

## 2022-06-14 DIAGNOSIS — Z95810 Presence of automatic (implantable) cardiac defibrillator: Secondary | ICD-10-CM

## 2022-06-14 DIAGNOSIS — I480 Paroxysmal atrial fibrillation: Secondary | ICD-10-CM | POA: Diagnosis not present

## 2022-06-14 LAB — TSH: TSH: 1.48 u[IU]/mL (ref 0.450–4.500)

## 2022-06-14 LAB — CUP PACEART INCLINIC DEVICE CHECK
Battery Remaining Longevity: 45 mo
Brady Statistic RA Percent Paced: 0.92 %
Brady Statistic RV Percent Paced: 2.2 %
Date Time Interrogation Session: 20231116181636
HighPow Impedance: 75.375
Implantable Lead Connection Status: 753985
Implantable Lead Connection Status: 753985
Implantable Lead Implant Date: 20091022
Implantable Lead Implant Date: 20180322
Implantable Lead Location: 753859
Implantable Lead Location: 753860
Implantable Lead Model: 7121
Implantable Pulse Generator Implant Date: 20180322
Lead Channel Impedance Value: 1950 Ohm
Lead Channel Impedance Value: 300 Ohm
Lead Channel Pacing Threshold Amplitude: 0.75 V
Lead Channel Pacing Threshold Amplitude: 0.75 V
Lead Channel Pacing Threshold Amplitude: 1.5 V
Lead Channel Pacing Threshold Amplitude: 1.5 V
Lead Channel Pacing Threshold Pulse Width: 0.5 ms
Lead Channel Pacing Threshold Pulse Width: 0.5 ms
Lead Channel Pacing Threshold Pulse Width: 0.8 ms
Lead Channel Pacing Threshold Pulse Width: 0.8 ms
Lead Channel Sensing Intrinsic Amplitude: 12 mV
Lead Channel Sensing Intrinsic Amplitude: 2.2 mV
Lead Channel Setting Pacing Amplitude: 2 V
Lead Channel Setting Pacing Amplitude: 3 V
Lead Channel Setting Pacing Pulse Width: 0.8 ms
Lead Channel Setting Sensing Sensitivity: 0.5 mV
Pulse Gen Serial Number: 7413245

## 2022-06-14 LAB — CBC
Hematocrit: 38.5 % (ref 37.5–51.0)
Hemoglobin: 12.2 g/dL — ABNORMAL LOW (ref 13.0–17.7)
MCH: 27.8 pg (ref 26.6–33.0)
MCHC: 31.7 g/dL (ref 31.5–35.7)
MCV: 88 fL (ref 79–97)
Platelets: 187 10*3/uL (ref 150–450)
RBC: 4.39 x10E6/uL (ref 4.14–5.80)
RDW: 13.1 % (ref 11.6–15.4)
WBC: 7.7 10*3/uL (ref 3.4–10.8)

## 2022-06-14 LAB — COMPREHENSIVE METABOLIC PANEL
ALT: 9 IU/L (ref 0–44)
AST: 11 IU/L (ref 0–40)
Albumin/Globulin Ratio: 1.8 (ref 1.2–2.2)
Albumin: 4.3 g/dL (ref 3.8–4.8)
Alkaline Phosphatase: 92 IU/L (ref 44–121)
BUN/Creatinine Ratio: 14 (ref 10–24)
BUN: 23 mg/dL (ref 8–27)
Bilirubin Total: 0.5 mg/dL (ref 0.0–1.2)
CO2: 24 mmol/L (ref 20–29)
Calcium: 9.3 mg/dL (ref 8.6–10.2)
Chloride: 96 mmol/L (ref 96–106)
Creatinine, Ser: 1.68 mg/dL — ABNORMAL HIGH (ref 0.76–1.27)
Globulin, Total: 2.4 g/dL (ref 1.5–4.5)
Glucose: 360 mg/dL — ABNORMAL HIGH (ref 70–99)
Potassium: 4.6 mmol/L (ref 3.5–5.2)
Sodium: 134 mmol/L (ref 134–144)
Total Protein: 6.7 g/dL (ref 6.0–8.5)
eGFR: 42 mL/min/{1.73_m2} — ABNORMAL LOW (ref >59)

## 2022-06-14 LAB — MAGNESIUM: Magnesium: 1.8 mg/dL (ref 1.6–2.3)

## 2022-06-14 MED ORDER — METOPROLOL SUCCINATE ER 25 MG PO TB24: 25.0000 mg | ORAL_TABLET | Freq: Every day | ORAL | 6 refills | Status: DC

## 2022-06-14 MED ORDER — RIVAROXABAN 15 MG PO TABS: 15.0000 mg | ORAL_TABLET | Freq: Every day | ORAL | 6 refills | Status: DC

## 2022-06-14 MED ORDER — LOSARTAN POTASSIUM 25 MG PO TABS: 25.0000 mg | ORAL_TABLET | Freq: Every day | ORAL | 6 refills | Status: DC

## 2022-06-14 NOTE — Patient Instructions (Signed)
Medication Instructions:   START TAKING:   TOPROL 25 MG ONCE A DAY   2.   XARELTO 15 MG ONCE A DAY AT DINNER   3.   LOSARTAN 25 MG ONCE A DAY    STOP TAKING AND REMOVE THIS MEDICATION FROM YOUR MEDICATION LIST: ELIQUIS AND ENTRESTO    *If you need a refill on your cardiac medications before your next appointment, please call your pharmacy*  Lab Work: CMET  MAG CBC TSH TODAY    If you have labs (blood work) drawn today and your tests are completely normal, you will receive your results only by: [place] (if you have MyChart) OR A paper copy in the mail If you have any lab test that is abnormal or we need to change your treatment, we will call you to review the results.   Testing/Procedures: NONE ORDERED  TODAY    Follow-Up: At [facility], you and your health needs are our priority.  As part of our continuing mission to provide you with exceptional heart care, we have created designated Provider Care Teams.  These Care Teams include your primary Cardiologist (physician) and Advanced Practice Providers (APPs -  Physician Assistants and Nurse Practitioners) who all work together to provide you with the care you need, when you need it.  We recommend signing up for the patient portal called "MyChart".  Sign up information is provided on this After Visit Summary.  MyChart is used to connect with patients for Virtual Visits (Telemedicine).  Patients are able to view lab/test results, encounter notes, upcoming appointments, etc.  Non-urgent messages can be sent to your provider as well.   To learn more about what you can do with MyChart, go to [email or web address].    Your next appointment:   1 month(s)  The format for your next appointment:   In Person  Provider:   You may see Cristopher Peru, MD or one of the following Advanced Practice Providers on your designated Care Team:   Tommye Standard, [place] Legrand Como "Jonni Sanger" Chalmers Cater, [place]   Other Instructions   Important  Information About [place]

## 2022-06-14 NOTE — Patient Instructions (Signed)
Visit Information  Phone number for Pharmacist: [phone]   Goals Addressed   None     Care Plan : [place]  Updates made by Charlton Haws, RPH since 06/14/2022 12:00 AM     Problem: Hypertension, Hyperlipidemia, Diabetes, Atrial Fibrillation, Heart Failure, Coronary Artery Disease, and Chronic Kidney Disease   Priority: High     Long-Range Goal: Disease mgmt   Start Date: 10/18/2021  Expected End Date: 10/19/2022  This Visit's Progress: Not on track  Recent Progress: Not on track  Priority: High  Note:   Current Barriers:  Unable to independently monitor therapeutic efficacy Unable to self administer medications as prescribed  Pharmacist Clinical Goal(s):  Patient will achieve adherence to monitoring guidelines and medication adherence to achieve therapeutic efficacy achieve ability to self administer medications as prescribed through use of pill packs as evidenced by patient report through collaboration with PharmD and provider.   Interventions: 1:1 collaboration with Michela Pitcher, NP regarding development and update of comprehensive plan of care as evidenced by provider attestation and co-signature Inter-disciplinary care team collaboration (see longitudinal plan of care) Comprehensive medication review performed; medication list updated in electronic medical record  Hypertension / NSVT (BP goal <140/90) -Controlled - BP low-normal in clinic; he is not taking Entresto currently (PAP expired probably Aug 2023), it is unclear how long he has been off of this - concern that restarting it may drop BP too low -Last ejection fraction: 25-30% (Date: 07/2017) -HF type: Systolic; NYHA Class: II-III -Current treatment: Carvedilol 3.125 mg BID - Appropriate, Effective, Safe, Accessible Furosemide 20 mg daily - Appropriate, Effective, Safe, Accessible Entresto 24-26 mg BID  - not taking (PAP expired) -Medications previously tried: n/a  -Denies  hypotensive/hypertensive symptoms -Educated on BP goals and benefits of medications for prevention of heart attack, stroke and kidney damage; -Counseled to monitor BP at home periodically -Recommended to consolidate BID meds into QD meds - recommend to change carvedilol to metoprolol succinate; hold enresto - consulting with cardiology  Hyperlipidemia: (LDL goal < 70) -Not ideally controlled - LDL 123 (11/2021) above goal and worsened since 06/2021, likely due to adherence issues -  compliance with all medications is an issue due to dementia -Hx NSTEMI -Current treatment: Atorvastatin 80 mg daily - Appropriate, Query Effective Ezetimibe 10 mg daily -Appropriate, Query Effective -Medications previously tried: n/a  -Educated on Cholesterol goals; Benefits of statin for ASCVD risk reduction; -Recommended to continue current medication; encouraged compliance with pill packs  Atrial Fibrillation (Goal: prevent stroke and major bleeding) -Query controlled - med compliance is an issue; concern that patient forgets bedtime doses so is missing 2nd dose of Eliquis often, would consider switching to Xarelto for once daily administration -CHADSVASC: 6 (age2, male CHF, HTN, CAD, DM) -Current treatment: Amiodarone 200 mg daily - Appropriate, Effective, Safe, Accessible Carvedilol 3.125 mg BID - Appropriate, Effective, Safe, Accessible Eliquis 5 mg BID - Appropriate, Query Effective -Medications previously tried: n/a -Counseled on increased risk of stroke due to Afib and benefits of anticoagulation for stroke prevention; -Recommend to continue current medication; recommend switching Eliquis to Xarelto - consulting with cardiology  Diabetes (A1c goal <8%) -Controlled - A1c 9.1% (02/2022) improved somewhat from 10.0% (11/2021) -Current medications: Glipizide XL 10 mg daily AM - Appropriate, Effective, Safe, Accessible -Medications previously tried: metformin  -Educated on A1c and blood sugar  goals; -Counseled to check feet daily and get yearly eye exams -Recommended to continue current medication; consider weekly injection GLP1-RA that can be  administered by family to improve compliance  Cognitive impairment (Goal: slow progression) -Query controlled - memory issues are impacting health conditions (DM is worse, blood thinner likely ineffective, etc) -Current treatment  Donepezil 5 mg daily HS - Appropriate, Effective, Safe, Accessible -Medications previously tried: n/a  -Recommended to continue current medication  Health Maintenance -Overall questionable medication adherence even with pill packs due to memory issues; per son, patient often forgets to take the packs even though the box is placed on his side table near his chair in living room -Encouraged son to monitor medication adherence as able  Patient Goals/Self-Care Activities Patient will:  - take medications as prescribed as evidenced by patient report and record review focus on medication adherence by pill packs       The patient verbalized understanding of instructions, educational materials, and care plan provided today and DECLINED offer to receive copy of patient instructions, educational materials, and care plan.  Telephone follow up appointment with pharmacy team member scheduled for: 3 months  Charlene Brooke, PharmD, [facility] Clinical Pharmacist [facility] [phone]

## 2022-06-15 ENCOUNTER — Telehealth: Payer: Self-pay | Admitting: Internal Medicine

## 2022-06-15 NOTE — Telephone Encounter (Signed)
Spoke with patient's son to review results.  Reviewed note from Tommye Standard, [place]. Son states he is unable to bring the patient in the next 7-10 days. He does not live here and the patient is not driving.  Also son reports that patient does not consistently take his medications as ordered even with pill packs. Patient forgets to take medications when he is alone and his son does not live with patient to manage medications daily.  Advised I would forward concerns to Tommye Standard, [place].

## 2022-06-15 NOTE — Telephone Encounter (Signed)
Son was returning calling for results. Please advise

## 2022-06-15 NOTE — Progress Notes (Signed)
EPIC Encounter for ICM Monitoring  Patient Name: Randy [name] is a 76 y.o. male Date: 06/15/2022 Primary Care Physican: Michela Pitcher, NP Primary Cardiologist: Lovena Le Electrophysiologist: Lovena Le 02/08/2022 Office Weight: 186 lbs   AT/AF Burden: 6.8%      Transmission reviewed. Per 11/16 OV note with Tommye Standard, PA, patient med compliance is about 50%.    Corvue thoracic impedance suggesting normal fluid levels.    Prescribed: Furosemide 20 mg 1 tablet daily   Labs: 06/14/2022 Creatinine 1.68, BUN 23, Potassium 4.6, Sodium 134, GFR 42 03/16/2022 Creatinine 1.71, BUN 24, Potassium 3.7, Sodium 136, GFR 38.45 12/15/2021 Creatinine 1.73, BUN 25, Potassium 4.7, Sodium 135, GFR 37.98 12/01/2021 Creatinine 2.02, BUN 37, Potassium 5.3, Sodium 135, GFR 31.54  11/28/2021 Creatinine 2.39, BUN 32, Potassium 3.8, Sodium 134, GFR 25.78  A complete set of results can be found in Results Review.   Recommendations:  No changes.     Follow-up plan: ICM clinic phone appointment on 07/16/2022.  91 day device clinic remote transmission scheduled for 07/03/2022.    EP/Cardiology Office Visits:  07/16/2022 with Tommye Standard, PA.     Copy of ICM check sent to Dr. Lovena Le.     3 month ICM trend: 06/11/2022.    12-14 Month ICM trend:     Rosalene Billings, RN 06/15/2022 12:40 PM

## 2022-06-18 ENCOUNTER — Telehealth: Payer: Self-pay

## 2022-06-18 NOTE — Chronic Care Management (AMB) (Unsigned)
Chronic Care Management Pharmacy Assistant   Name: CALYB MCQUARRIE  MRN: [id number] DOB: 10-12-1945  Reason for Encounter: Medication Adherence and Delivery Coordination   Recent office visits:  None since last CCM contact  Recent consult visits:  06/14/22-Lynn Ursuy,PA(cardio)-f/u  Labs,(Please repeat BMET in a week )change eliquis to xarelto 50m,carvedilol to metoprolol succinate 216m,entresto to losartan 2563m f/u 1 month  Hospital visits:  None in previous 6 months  Medications: Outpatient Encounter Medications as of 06/18/2022  Medication Sig   Accu-Chek Softclix Lancets lancets USE AS DIRECTED  TO TEST BLOOD SUGAR ONE TIME DAILY  AND AS NEEDED   Alcohol Swabs (B-D SINGLE USE SWABS REGULAR) PADS Use as instructed to clean area for glucose monitoring once daily and as needed.  Diagnosis:  E11.21  Non insulin-dependent   allopurinol (ZYLOPRIM) 100 MG tablet Take 0.5 tablets (50 mg total) by mouth daily.   amiodarone (PACERONE) 200 MG tablet Take 1 tablet (200 mg total) by mouth daily.   atorvastatin (LIPITOR) 80 MG tablet TAKE 1 TABLET (80 MG TOTAL) DAILY AT 6 PM.   Blood Glucose Calibration (ACCU-CHEK AVIVA) SOLN Use to calibrate blood glucose machine as recommended.  Diagnosis:  E11.21  Non-insulin dependent.   Blood Glucose Monitoring Suppl (ACCU-CHEK GUIDE) w/Device KIT Use to check blood sugar once daily.   carvedilol (COREG) 3.125 MG tablet TAKE ONE TABLET BY MOUTH TWICE DAILY   donepezil (ARICEPT) 5 MG tablet TAKE ONE TABLET BY MOUTH EVERY EVENING   ezetimibe (ZETIA) 10 MG tablet TAKE ONE TABLET BY MOUTH EVERY MORNING   finasteride (PROSCAR) 5 MG tablet TAKE ONE TABLET BY MOUTH EVERY MORNING   furosemide (LASIX) 20 MG tablet TAKE ONE TABLET BY MOUTH EVERY MORNING   gabapentin (NEURONTIN) 300 MG capsule TAKE TWO CAPSULES BY MOUTH TWICE DAILY   glipiZIDE (GLUCOTROL XL) 10 MG 24 hr tablet TAKE ONE TABLET BY MOUTH EVERY MORNING   glucose blood (ACCU-CHEK AVIVA PLUS) test  strip Test Blood Sugar 1 time daily: Dx:E11.27   losartan (COZAAR) 25 MG tablet Take 1 tablet (25 mg total) by mouth daily.   metoprolol succinate (TOPROL XL) 25 MG 24 hr tablet Take 1 tablet (25 mg total) by mouth daily.   Rivaroxaban (XARELTO) 15 MG TABS tablet Take 1 tablet (15 mg total) by mouth daily with supper.   tamsulosin (FLOMAX) 0.4 MG CAPS capsule TAKE ONE CAPSULE BY MOUTH EVERY EVENING   No facility-administered encounter medications on file as of 06/18/2022.    BP Readings from Last 3 Encounters:  06/14/22 122/62  03/16/22 (!) 110/58  02/08/22 (!) 100/58    Lab Results  Component Value Date   HGBA1C 9.1 (A) 03/16/2022      Recent OV, Consult or Hospital visit:  Recent medication changes indicated:  06/14/22-Cardiology-change eliquis to xarelto 50m24mrvedilol to metoprolol succinate 25mg64mtresto to losartan 25mg 54mast adherence delivery date:05/30/22      Patient is due for next adherence delivery on: 06/29/22  Spoke with patient on 06/18/22 reviewed medications and coordinated delivery.  This delivery to include: Adherence Packaging  30 Days  Packs: Gabapentin 300 mg - 2 daily Furosemide 20 mg - 1 daily Allopurinol 100 mg - 1/2 tablet daily  Tamsulosin 0.4mg - 63maily  Finasteride 5 mg - 1 daily  Atorvastatin 80 mg - 1 tablet  Glipizide ER 10 mg - 1 daily   Zetia 10 mg- 1 tablet daily Donepezil 5mg -1 37mlet  Amiodarone 200mg -19m  1 tablet Xarelto 10mmg - take 1 tablet daily Losartan 245m-take 1 tablet daily Metoprolol succinate 2545mtake 1 tablet daily  VIAL medications: none   Patient declined the following medications this month: None    Any concerns about your medications? No  How often do you forget or accidentally miss a dose? Once a month-working on timing of taking meds due to patient forgets  2nd doses at times.  Do you use a pillbox? No  Is patient in packaging Yes  What is the date on your next pill pack? Not home to read date  on package  Any concerns or issues with your packaging? satisfied   Refills requested from providers include: allopurinol, furosemide  Confirmed delivery date of 06/29/22, advised patient that pharmacy will contact them the morning of delivery.  Recent blood pressure readings are as follows:none available    Recent blood glucose readings are as follows:none available    Annual wellness visit in last year? No Most Recent BP reading:110/58  03/16/22  If Diabetic: Most recent A1C reading:9.1  03/16/22 Last eye exam / retinopathy screening:2020 Last diabetic foot exam:UTD  [facility]ycle dispensing [facility].  LinCharlene BrookePP notified  VelAvel [phone]

## 2022-06-19 ENCOUNTER — Other Ambulatory Visit: Payer: Self-pay | Admitting: Nurse Practitioner

## 2022-06-19 DIAGNOSIS — E790 Hyperuricemia without signs of inflammatory arthritis and tophaceous disease: Secondary | ICD-10-CM

## 2022-06-19 NOTE — Telephone Encounter (Signed)
We are changing pill packs so that all medications are taken at same time once daily, as patient has previously had trouble with BID dosing of meds. Recently had BID medications changed to QD options (Xarelto, metoprolol, losartan).

## 2022-06-19 NOTE — Telephone Encounter (Signed)
Contacted the patient and explained the way the new packs will be coming. All medications once a day. 6 tablets to a pack, there will be 3 packs to take once a day.  Charlene Brooke, CPP notified  [name], [place]  [phone]

## 2022-06-20 NOTE — Telephone Encounter (Signed)
Ok to change the medications as mentioned. GT

## 2022-06-27 ENCOUNTER — Encounter: Payer: Self-pay | Admitting: Nurse Practitioner

## 2022-06-27 ENCOUNTER — Ambulatory Visit (INDEPENDENT_AMBULATORY_CARE_PROVIDER_SITE_OTHER): Payer: Medicare HMO | Admitting: Nurse Practitioner

## 2022-06-27 ENCOUNTER — Telehealth: Payer: Self-pay | Admitting: Nurse Practitioner

## 2022-06-27 ENCOUNTER — Telehealth: Payer: Self-pay

## 2022-06-27 VITALS — BP 122/56 | HR 53 | Temp 98.9°F | Resp 14 | Ht 69.0 in | Wt 185.2 lb

## 2022-06-27 DIAGNOSIS — G3184 Mild cognitive impairment, so stated: Secondary | ICD-10-CM

## 2022-06-27 DIAGNOSIS — M1A09X0 Idiopathic chronic gout, multiple sites, without tophus (tophi): Secondary | ICD-10-CM

## 2022-06-27 DIAGNOSIS — I5022 Chronic systolic (congestive) heart failure: Secondary | ICD-10-CM

## 2022-06-27 DIAGNOSIS — E1121 Type 2 diabetes mellitus with diabetic nephropathy: Secondary | ICD-10-CM | POA: Diagnosis not present

## 2022-06-27 DIAGNOSIS — Z0000 Encounter for general adult medical examination without abnormal findings: Secondary | ICD-10-CM | POA: Diagnosis not present

## 2022-06-27 DIAGNOSIS — Z95810 Presence of automatic (implantable) cardiac defibrillator: Secondary | ICD-10-CM

## 2022-06-27 DIAGNOSIS — I10 Essential (primary) hypertension: Secondary | ICD-10-CM | POA: Diagnosis not present

## 2022-06-27 DIAGNOSIS — Z125 Encounter for screening for malignant neoplasm of prostate: Secondary | ICD-10-CM

## 2022-06-27 DIAGNOSIS — N183 Chronic kidney disease, stage 3 unspecified: Secondary | ICD-10-CM | POA: Diagnosis not present

## 2022-06-27 DIAGNOSIS — H6123 Impacted cerumen, bilateral: Secondary | ICD-10-CM

## 2022-06-27 DIAGNOSIS — E785 Hyperlipidemia, unspecified: Secondary | ICD-10-CM | POA: Diagnosis not present

## 2022-06-27 DIAGNOSIS — Z1211 Encounter for screening for malignant neoplasm of colon: Secondary | ICD-10-CM | POA: Diagnosis not present

## 2022-06-27 LAB — LIPID PANEL
Cholesterol: 290 mg/dL — ABNORMAL HIGH (ref 0–200)
HDL: 36.3 mg/dL — ABNORMAL LOW (ref >39.00)
Total CHOL/HDL Ratio: 8
Triglycerides: 770 mg/dL — ABNORMAL HIGH (ref 0.0–149.0)

## 2022-06-27 LAB — CBC
HCT: 38.2 % — ABNORMAL LOW (ref 39.0–52.0)
Hemoglobin: 12.9 g/dL — ABNORMAL LOW (ref 13.0–17.0)
MCHC: 33.7 g/dL (ref 30.0–36.0)
MCV: 85.6 fl (ref 78.0–100.0)
Platelets: 197 10*3/uL (ref 150.0–400.0)
RBC: 4.46 Mil/uL (ref 4.22–5.81)
RDW: 13.7 % (ref 11.5–15.5)
WBC: 7.4 10*3/uL (ref 4.0–10.5)

## 2022-06-27 LAB — COMPREHENSIVE METABOLIC PANEL
ALT: 8 U/L (ref 0–53)
AST: 10 U/L (ref 0–37)
Albumin: 4.3 g/dL (ref 3.5–5.2)
Alkaline Phosphatase: 86 U/L (ref 39–117)
BUN: 23 mg/dL (ref 6–23)
CO2: 28 mEq/L (ref 19–32)
Calcium: 9.2 mg/dL (ref 8.4–10.5)
Chloride: 95 mEq/L — ABNORMAL LOW (ref 96–112)
Creatinine, Ser: 1.83 mg/dL — ABNORMAL HIGH (ref 0.40–1.50)
GFR: 35.37 mL/min — ABNORMAL LOW (ref >60.00)
Glucose, Bld: 518 mg/dL (ref 70–99)
Potassium: 4.1 mEq/L (ref 3.5–5.1)
Sodium: 132 mEq/L — ABNORMAL LOW (ref 135–145)
Total Bilirubin: 0.5 mg/dL (ref 0.2–1.2)
Total Protein: 7 g/dL (ref 6.0–8.3)

## 2022-06-27 LAB — HEMOGLOBIN A1C: Hgb A1c MFr Bld: 13.6 % — ABNORMAL HIGH (ref 4.6–6.5)

## 2022-06-27 LAB — LDL CHOLESTEROL, DIRECT: Direct LDL: 155 mg/dL

## 2022-06-27 LAB — PSA: PSA: 0.31 ng/mL (ref 0.10–4.00)

## 2022-06-27 LAB — TSH: TSH: 1.17 u[IU]/mL (ref 0.35–5.50)

## 2022-06-27 NOTE — Assessment & Plan Note (Signed)
Patient currently maintained on allopurinol.  Continue

## 2022-06-27 NOTE — Assessment & Plan Note (Signed)
Patient currently maintained on atorvastatin 80 mg.  Pending lipid panel today

## 2022-06-27 NOTE — Telephone Encounter (Signed)
[facility] [place] [facility] called critical lab for Glucose 518. Sending note to Romilda Garret NP and [place] [facility] and will speak with Anastasiya CMA. Logged in critical lab book in lab.

## 2022-06-27 NOTE — Assessment & Plan Note (Signed)
Followed by cardiology has had a several runs of V. tach per cardiology note.  Patient has had no symptoms nor felt defibrillator fire.

## 2022-06-27 NOTE — Patient Instructions (Addendum)
Nice to see you today I will be in touch with the labs Work on drinking more water versus soda Follow up with me in 3 months, sooner if you need me  Get a Tetanus vaccine and the second shingles (shingrix) vaccine at your local pharmacy   Call Dr Milinda Pointer the foot doctor  Address: [address] [place], [place], [place] [address] Phone: [phone]

## 2022-06-27 NOTE — Assessment & Plan Note (Signed)
Patient was switched from Entresto to losartan patient switched from Coreg to metoprolol patient was switched from apixaban to Xarelto for his paroxysmal A-fib.

## 2022-06-27 NOTE — Assessment & Plan Note (Signed)
Patient has been having improved renal function.  Pending results today

## 2022-06-27 NOTE — Assessment & Plan Note (Signed)
Currently maintained on donepezil 5 mg.  Stable

## 2022-06-27 NOTE — Telephone Encounter (Signed)
Sheria Lang Mr. Gabrielson and his son in office today. The son was asking if you could give him a call and help with something's in regards to [facility] application and process.  Thanks for you help  Matt C

## 2022-06-27 NOTE — Assessment & Plan Note (Signed)
Urine micro up-to-date.  Pending A1c today.  Patient is amendable to trying a GP L1 receptor agonist.  Patient denies history of medullary thyroid cancer, multiple endocrine neoplasia syndrome type II, and or pancreatitis.  Detailed foot exam performed today

## 2022-06-27 NOTE — Telephone Encounter (Signed)
noted 

## 2022-06-27 NOTE — Assessment & Plan Note (Signed)
Patient currently maintained on losartan, metoprolol, amiodarone.  Continue medication as prescribed

## 2022-06-27 NOTE — Assessment & Plan Note (Signed)
Verbal consent obtained.  Patient was prepped per office policy with cerumen softening eardrops.  A mixture of water and hydroperoxide was used to irrigate bilateral ears.  Patient tolerated procedure well.  Left ear was disimpacted.  Right ear was not.  Did inform patient use over-the-counter cerumen softening eardrops for approximately 3 days prior to next office visit and we can try to reirrigate at that point.

## 2022-06-27 NOTE — Progress Notes (Signed)
Established Patient Office Visit  Subjective   Patient ID: Randy [name], male    DOB: 12-14-1945  Age: 76 y.o. MRN: [id number]  Chief Complaint  Patient presents with   Annual Exam    HPI   for complete physical and follow up of chronic conditions  DM2: States that he has not been checking his sugar. States that he been drinking mountain dew and other sodas. Will drink sweet tea  HTN: Patient currently maintained on amiodarone, losartan, furosemide, metoprolol succinate.  Patient's Entresto, carvedilol, apixaban were discontinued due to multiple dosings throughout the day.  Gout: Patient currently maintained on allopurinol and having no difficulty or trouble with gout flares  MCI: Patient currently maintained on donepezil 5 mg.  Patient's son at bedside per patient report and patient's son memory is "so-so".  Still having difficulty with short-term memory  Immunizations: -Tetanus: need updating at local pharmacy -Influenza: Needs updating -Shingles: need second dose at local pharmacy -Pneumonia: Up-to-date  -HPV: aged out  Diet: [place]. 3-4 meals with some snacks. Some water, soda, and sweet tea.  Exercise: No regular exercise. Would mow the yard in the summer.  Eye exam:  unsure of when he went to eye doctor  Dental exam: Needs updating   Colonoscopy: Completed in 2011, with a 10-year recall.  [place] Lung Cancer Screening: Does not qualify Dexa: N/A  PSA: Due  Sleep: goes to bed around 10-11 and  will get up around 7-830 and feels rested. States he does not know if he snores.      Review of Systems  Constitutional:  Negative for chills and fever.  Respiratory:  Negative for shortness of breath (DOE).   Cardiovascular:  Negative for chest pain and leg swelling.  Gastrointestinal:  Negative for abdominal pain, blood in stool, diarrhea, nausea and vomiting.       Every other day with BM  Genitourinary:  Negative for dysuria and hematuria.        Nocturia x1  Neurological:  Negative for tingling and headaches.  Psychiatric/Behavioral:  Negative for hallucinations and suicidal ideas.       Objective:     BP (!) 122/56   Pulse (!) 53   Temp 98.9 F (37.2 C) (Oral)   Resp 14   Ht '5\' 9"'$  (1.753 m)   Wt 185 lb 4 oz (84 kg)   SpO2 94%   BMI 27.36 kg/m  BP Readings from Last 3 Encounters:  06/27/22 (!) 122/56  06/14/22 122/62  03/16/22 (!) 110/58   Wt Readings from Last 3 Encounters:  06/27/22 185 lb 4 oz (84 kg)  06/14/22 186 lb 3.2 oz (84.5 kg)  03/16/22 184 lb 8 oz (83.7 kg)      Physical Exam Vitals and nursing note reviewed. Exam conducted with a chaperone present.  Constitutional:      Appearance: Normal appearance.  HENT:     Right Ear: Ear canal and external ear normal. There is impacted cerumen.     Left Ear: Ear canal and external ear normal. There is impacted cerumen.     Mouth/Throat:     Mouth: Mucous membranes are moist.     Pharynx: Oropharynx is clear.  Eyes:     Extraocular Movements: Extraocular movements intact.     Pupils: Pupils are equal, round, and reactive to light.  Cardiovascular:     Rate and Rhythm: Normal rate and regular rhythm.     Pulses: Normal pulses.     Heart sounds:  Normal heart sounds.  Pulmonary:     Effort: Pulmonary effort is normal.     Breath sounds: Normal breath sounds.  Musculoskeletal:     Right lower leg: No edema.     Left lower leg: No edema.  Skin:    General: Skin is warm.  Neurological:     General: No focal deficit present.     Mental Status: He is alert.  Psychiatric:        Mood and Affect: Mood normal.        Behavior: Behavior normal.        Thought Content: Thought content normal.        Judgment: Judgment normal.    Diabetic Foot Form - Detailed   Diabetic Foot Exam - detailed Diabetic Foot exam was performed with the following findings: Yes   Is there swelling or and abnormal foot shape?: No Is there a claw toe deformity?: No Is there  elevated skin temparature?: No Pulse Foot Exam completed.: Yes   Right posterior Tibialias: Present Left posterior Tibialias: Present   Right Dorsalis Pedis: Present Left Dorsalis Pedis: Present  Sensory Foot Exam Completed.: Yes Semmes-Weinstein Monofilament Test   Comments: Right foot absent at: 1,3,5,4,7,9  Left foot absent at: 10, 2, 3 7,9       No results found for any visits on 06/27/22.    The ASCVD Risk score (Arnett DK, et al., 2019) failed to calculate for the following reasons:   The patient has a prior MI or stroke diagnosis    Assessment & Plan:   Problem List Items Addressed This Visit       Cardiovascular and Mediastinum   Essential hypertension    Patient currently maintained on losartan, metoprolol, amiodarone.  Continue medication as prescribed      Relevant Orders   CBC   Comprehensive metabolic panel   TSH   Lipid panel   Ambulatory referral to Ophthalmology   Chronic systolic CHF (congestive heart failure) ([place])    Patient was switched from Entresto to losartan patient switched from Coreg to metoprolol patient was switched from apixaban to Xarelto for his paroxysmal A-fib.      Relevant Orders   CBC   Comprehensive metabolic panel   TSH     Endocrine   Type 2 diabetes with nephropathy (HCC)    Urine micro up-to-date.  Pending A1c today.  Patient is amendable to trying a GP L1 receptor agonist.  Patient denies history of medullary thyroid cancer, multiple endocrine neoplasia syndrome type II, and or pancreatitis.  Detailed foot exam performed today      Relevant Orders   Hemoglobin A1c   Lipid panel   Ambulatory referral to Ophthalmology     Nervous and Auditory   Bilateral impacted cerumen    Verbal consent obtained.  Patient was prepped per office policy with cerumen softening eardrops.  A mixture of water and hydroperoxide was used to irrigate bilateral ears.  Patient tolerated procedure well.  Left ear was disimpacted.  Right ear  was not.  Did inform patient use over-the-counter cerumen softening eardrops for approximately 3 days prior to next office visit and we can try to reirrigate at that point.      Relevant Orders   Ear Lavage     Genitourinary   CKD (chronic kidney disease) stage 3, GFR 30-59 ml/min (HCC)    Patient has been having improved renal function.  Pending results today      Relevant Orders  CBC   Comprehensive metabolic panel     Other   HLD (hyperlipidemia)    Patient currently maintained on atorvastatin 80 mg.  Pending lipid panel today      Relevant Orders   Lipid panel   Implantable cardioverter-defibrillator (ICD) in situ    Followed by cardiology has had a several runs of V. tach per cardiology note.  Patient has had no symptoms nor felt defibrillator fire.      Idiopathic gout of multiple sites    Patient currently maintained on allopurinol.  Continue      Mild cognitive impairment    Currently maintained on donepezil 5 mg.  Stable      Other Visit Diagnoses     Preventative health care    -  Primary   Relevant Orders   CBC   Comprehensive metabolic panel   Hemoglobin A1c   TSH   Lipid panel   Ambulatory referral to Ophthalmology   Screening for prostate cancer       Relevant Orders   PSA   Screening for colon cancer       Relevant Orders   Ambulatory referral to Gastroenterology     SEND JANET A MESSAGE ABOUT SW  Return in about 3 months (around 09/27/2022) for DM recheck .    Romilda Garret, NP

## 2022-06-28 ENCOUNTER — Telehealth: Payer: Self-pay | Admitting: Nurse Practitioner

## 2022-06-28 ENCOUNTER — Other Ambulatory Visit: Payer: Self-pay | Admitting: Nurse Practitioner

## 2022-06-28 ENCOUNTER — Telehealth: Payer: Self-pay | Admitting: *Deleted

## 2022-06-28 ENCOUNTER — Ambulatory Visit: Payer: Self-pay | Admitting: *Deleted

## 2022-06-28 DIAGNOSIS — I10 Essential (primary) hypertension: Secondary | ICD-10-CM

## 2022-06-28 DIAGNOSIS — E1121 Type 2 diabetes mellitus with diabetic nephropathy: Secondary | ICD-10-CM

## 2022-06-28 DIAGNOSIS — I480 Paroxysmal atrial fibrillation: Secondary | ICD-10-CM

## 2022-06-28 DIAGNOSIS — I5022 Chronic systolic (congestive) heart failure: Secondary | ICD-10-CM

## 2022-06-28 DIAGNOSIS — E782 Mixed hyperlipidemia: Secondary | ICD-10-CM

## 2022-06-28 MED ORDER — TRULICITY 0.75 MG/0.5ML ~~LOC~~ SOAJ: 0.7500 mg | SUBCUTANEOUS | 0 refills | Status: DC

## 2022-06-28 NOTE — Telephone Encounter (Signed)
-----   Message from Charlton Haws, [facility] sent at 06/28/2022  8:49 AM EST ----- Randy [name] - spoke with pharmacy and the copay for 1 month of Trulicity is $681, I doubt this will be affordable for him and there isn't any patient assistance available for Trulicity right now. We can apply for Ozempic assistance, it will take about a month to get the product shipped. I've been saving a donated Ozempic pen for a rainy day and we could give it to this patient to get him started if you like.

## 2022-06-28 NOTE — Telephone Encounter (Signed)
I think switching to ozempic is fine if it is the more affordable option

## 2022-06-28 NOTE — Progress Notes (Signed)
  Care Coordination   Note   06/28/2022 Name: Randy [name] MRN: [id number] DOB: [birth year]  Randy [name] is a 76 y.o. year old male who sees [name], Alyson Locket, NP for primary care. I reached out to Randy [name] by phone today to offer care coordination services.  Mr. Crossen was given information about Care Coordination services today including:   The Care Coordination services include support from the care team which includes your Nurse Coordinator, Clinical Social Worker, or Pharmacist.  The Care Coordination team is here to help remove barriers to the health concerns and goals most important to you. Care Coordination services are voluntary, and the patient may decline or stop services at any time by request to their care team member.   Care Coordination Consent Status: Patient agreed to services and verbal consent obtained.   Follow up plan:  Telephone appointment with care coordination team member scheduled for:  06/29/2022  Encounter Outcome:  Pt. Scheduled per message from Eduard Clos, LCSW see phone note    Julian Hy, [place] Direct Dial: [phone]

## 2022-06-29 ENCOUNTER — Encounter: Payer: Medicare HMO | Admitting: *Deleted

## 2022-06-29 NOTE — Telephone Encounter (Signed)
Spoke with pharmacy. Patient apparently declined to fill medications this month as total cost for all meds was $347 (without Trulicity). He does have a lot of leftover meds from previous months due to noncompliance. Recently I had worked with cardiology to consolidate all medications into once daily options to try to ease some of the pill burden, and this month would be the first time filling the new pill packs with once daily dosing. Unfortunately patient cannot afford Xarelto copay either ($147) as he is in the donut hole. They are starting to apply for Medicaid and look into long term care, but that will likely take several weeks to months.  Attempted to reach patient's son Laverna Peace to discuss options. We can probably get him a Xarelto free trial card which would bring total cost of medications for December down to ~$56. We will also offer Ozempic sample to get him started while working on manufacturer assistance. I was not able to reach son Laverna Peace, left message to return call.

## 2022-06-29 NOTE — Patient Instructions (Signed)
Visit Information  Thank you for taking time to visit with me today. Please don't hesitate to contact me if I can be of assistance to you.   Following are the goals we discussed today:   Goals Addressed             This Visit's Progress    Medicaid application process       Care Coordination Interventions: Phone call to patient's son to discuss medicaid application process and long term care option Per patient's son, patient is easily confused, cannot complete his ADL's independently- managing house household duties  difficult as well as managing  his medication  Patient's son discussed patient being on a slow declined for the last 4 years Patient lives alone, son assists with his care, however due to slow decline patient may need memory care due, per patient's son, patient needs transition to higher level of care Long term care eligibility discussed, contact information for the Department of Social Services provided, discussed need for patient to agree to placement as he has capacity Fl2 to be requested by patient's provider Depression screen reviewed  Solution-Focused Strategies employed:  Caregiver stress acknowledged          Our next appointment is by telephone on 07/12/22 at 3pm  Please call the care guide team at [phone] if you need to cancel or reschedule your appointment.   If you are experiencing a Mental Health or [place] or need someone to talk to, please call 911   Patient verbalizes understanding of instructions and care plan provided today and agrees to view in [place]. Active MyChart status and patient understanding of how to access instructions and care plan via MyChart confirmed with patient.     Telephone follow up appointment with care management team member scheduled for: 07/12/22  Elliot Gurney, [place] Worker  [facility] Care Management [phone]

## 2022-06-29 NOTE — Patient Outreach (Signed)
  Care Coordination   Initial Visit Note   06/29/2022 Name: Randy [name] MRN: [id number] DOB: 05-May-1946  Randy [name] is a 76 y.o. year old male who sees [name], Randy Locket, NP for primary care. I spoke with  Randy [name]'s son by phone today.  What matters to the patients health and wellness today?  Patient's son interested in long term care for patient    Goals Addressed             This Visit's Progress    Medicaid application process       Care Coordination Interventions: Phone call to patient's son to discuss medicaid application process and long term care option Per patient's son, patient is easily confused, cannot complete his ADL's independently- managing house household duties  difficult as well as managing  his medication  Patient's son discussed patient being on a slow declined for the last 4 years Patient lives alone, son assists with his care, however due to slow decline patient may need memory care due, per patient's son, patient needs transition to higher level of care Long term care eligibility discussed, contact information for the Department of Social Services provided, discussed need for patient to agree to placement as he has capacity Fl2 to be requested by patient's provider Depression screen reviewed  Solution-Focused Strategies employed:  Caregiver stress acknowledged          SDOH assessments and interventions completed:  Yes  SDOH Interventions Today    Flowsheet Row Most Recent Value  SDOH Interventions   Food Insecurity Interventions Intervention Not Indicated  Housing Interventions Intervention Not Indicated  Transportation Interventions Intervention Not Indicated  Physical Activity Interventions Intervention Not Indicated        Care Coordination Interventions:  Yes, provided   Follow up plan: Follow up call scheduled for 07/12/22    Encounter Outcome:  Pt. Visit Completed

## 2022-07-02 NOTE — Telephone Encounter (Signed)
Spoke with son Laverna Peace, he agrees that once-daily pill packs will be more manageable. He reports he was given samples of Xarelto for about 30 day supply so that can be removed from this month's delivery.   Also discussed Ozempic, he reports patient will not be able to give himself injections and son Laverna Peace does not think he will be able to get over there each week to give him the injection. He reports a pill would be easier - discussed Rybelsus is the pill-version of Ozempic, though less potent would be a reasonable option. He should qualify for manufacturer assistance for Rybelsus.  Recommend to start Rybelsus 3 mg daily x 4 weeks, then increase to 7 mg.  Obtain medication through [employer].

## 2022-07-02 NOTE — Telephone Encounter (Signed)
Do you need a hard script from me or just send in electronic version ?

## 2022-07-02 NOTE — Addendum Note (Signed)
Addended by: Charlton Haws on: 07/02/2022 04:48 PM   Modules accepted: Orders

## 2022-07-03 ENCOUNTER — Ambulatory Visit (INDEPENDENT_AMBULATORY_CARE_PROVIDER_SITE_OTHER): Payer: Medicare HMO

## 2022-07-03 DIAGNOSIS — I255 Ischemic cardiomyopathy: Secondary | ICD-10-CM

## 2022-07-03 NOTE — Telephone Encounter (Addendum)
I applied online, so [employer] will reach out for the Rx once he is approved. Nothing further needed at the moment. I updated his med list with Rybelsis in the meantime.

## 2022-07-04 LAB — CUP PACEART REMOTE DEVICE CHECK
Battery Remaining Longevity: 41 mo
Battery Remaining Percentage: 43 %
Battery Voltage: 2.89 V
Brady Statistic AP VP Percent: 1 %
Brady Statistic AP VS Percent: 3.8 %
Brady Statistic AS VP Percent: 1 %
Brady Statistic AS VS Percent: 93 %
Brady Statistic RA Percent Paced: 1 %
Brady Statistic RV Percent Paced: 1 %
Date Time Interrogation Session: 20231206013606
HighPow Impedance: 82 Ohm
HighPow Impedance: 82 Ohm
Implantable Lead Connection Status: 753985
Implantable Lead Connection Status: 753985
Implantable Lead Implant Date: 20091022
Implantable Lead Implant Date: 20180322
Implantable Lead Location: 753859
Implantable Lead Location: 753860
Implantable Lead Model: 7121
Implantable Pulse Generator Implant Date: 20180322
Lead Channel Impedance Value: 1925 Ohm
Lead Channel Impedance Value: 310 Ohm
Lead Channel Pacing Threshold Amplitude: 0.75 V
Lead Channel Pacing Threshold Amplitude: 1.5 V
Lead Channel Pacing Threshold Pulse Width: 0.5 ms
Lead Channel Pacing Threshold Pulse Width: 0.8 ms
Lead Channel Sensing Intrinsic Amplitude: 12 mV
Lead Channel Sensing Intrinsic Amplitude: 2.3 mV
Lead Channel Setting Pacing Amplitude: 2 V
Lead Channel Setting Pacing Amplitude: 3 V
Lead Channel Setting Pacing Pulse Width: 0.8 ms
Lead Channel Setting Sensing Sensitivity: 0.5 mV
Pulse Gen Serial Number: 7413245

## 2022-07-09 ENCOUNTER — Telehealth: Payer: Self-pay | Admitting: *Deleted

## 2022-07-09 NOTE — Patient Instructions (Signed)
Visit Information  Thank you for taking time to visit with me today. Please don't hesitate to contact me if I can be of assistance to you.   Following are the goals we discussed today:   Goals Addressed             This Visit's Progress    Medicaid application process       Care Coordination Interventions: Phone call from patient's son to re-vsiti medicaid application process and long term care option Per patient's son, continues to report that patient is easily confused, cannot complete his ADL's independently- managing house household duties  difficult as well as managing  his medication  Encouraged patient's son to apply for special assistance medicaid to cover facility care costs Contact information provided to the Department of Social Services  Completed Fl2 to be requested by patient's provider         Our next appointment is by telephone on 07/10/22 at 3pm  Please call the care guide team at [phone] if you need to cancel or reschedule your appointment.   If you are experiencing a Mental Health or [place] or need someone to talk to, please call 911   Patient verbalizes understanding of instructions and care plan provided today and agrees to view in [place]. Active MyChart status and patient understanding of how to access instructions and care plan via MyChart confirmed with patient.     Telephone follow up appointment with care management team member scheduled for: 07/10/22  Elliot Gurney, [place] Worker  [facility] Care Management [phone]

## 2022-07-09 NOTE — Patient Outreach (Signed)
  Care Coordination   Follow Up Visit Note   07/09/2022 Name: Randy [name] MRN: [id number] DOB: 07/13/1946  Randy [name] is a 76 y.o. year old male who sees [name], Alyson Locket, NP for primary care. I spoke with  Randy [name]'s son by phone today.  What matters to the patients health and wellness today?  Facility care    Goals Addressed             This Visit's Progress    Medicaid application process       Care Coordination Interventions: Phone call from patient's son to re-vsiti medicaid application process and long term care option Per patient's son, continues to report that patient is easily confused, cannot complete his ADL's independently- managing house household duties  difficult as well as managing  his medication  Encouraged patient's son to apply for special assistance medicaid to cover facility care costs Contact information provided to the Department of Social Services  Completed Fl2 to be requested by patient's provider         SDOH assessments and interventions completed:  No     Care Coordination Interventions:  Yes, provided   Follow up plan: Follow up call scheduled for 07/10/22    Encounter Outcome:  Pt. Visit Completed

## 2022-07-10 ENCOUNTER — Ambulatory Visit: Payer: Self-pay | Admitting: *Deleted

## 2022-07-10 NOTE — Patient Instructions (Signed)
Visit Information  Thank you for taking time to visit with me today. Please don't hesitate to contact me if I can be of assistance to you.   Following are the goals we discussed today:   Goals Addressed             This Visit's Progress    Medicaid application process       Care Coordination Interventions: Phone call from patient's son to re-vsiti medicaid application process and long term care options Confirmed contact with the Department of Social Services, who recommended the following options: apply in person or by mail -if in person, patient's son will need a consent form signed allowing him to apply for long term care medicaid on patient's behalf-he would also be assigned to a medicaid case-worker  Patient's son chooses to apply on-line for long term care medicaid-patient's son encouraged to indicate that he is applying for long term medicaid.        Our next appointment is by telephone on 07/24/22 at 3pm  Please call the care guide team at [phone] if you need to cancel or reschedule your appointment.   If you are experiencing a Mental Health or [place] or need someone to talk to, please call 911   Patient verbalizes understanding of instructions and care plan provided today and agrees to view in [place]. Active MyChart status and patient understanding of how to access instructions and care plan via MyChart confirmed with patient.     Telephone follow up appointment with care management team member scheduled for: 07/24/22  Elliot Gurney, [place] Worker  [facility] Care Management [phone]

## 2022-07-10 NOTE — Patient Outreach (Signed)
  Care Coordination   Follow Up Visit Note   07/10/2022 Name: JAHKEEM KURKA MRN: [id number] DOB: 1945-12-16  [name] is a 76 y.o. year old male who sees [name], Alyson Locket, NP for primary care. I spoke with  Elyn Aquas Gillies's son by phone today.  What matters to the patients health and wellness today?  Long term care medicaid    Goals Addressed             This Visit's Progress    Medicaid application process       Care Coordination Interventions: Phone call from patient's son to re-vsiti medicaid application process and long term care options Confirmed contact with the Department of Social Services, who recommended the following options: apply in person or by mail -if in person, patient's son will need a consent form signed allowing him to apply for long term care medicaid on patient's behalf-he would also be assigned to a medicaid case-worker  Patient's son chooses to apply on-line for long term care medicaid-patient's son encouraged to indicate that he is applying for long term medicaid.        SDOH assessments and interventions completed:  No     Care Coordination Interventions:  Yes, provided   Follow up plan: Follow up call scheduled for 07/24/22    Encounter Outcome:  Pt. Visit Completed

## 2022-07-12 ENCOUNTER — Encounter: Payer: Medicare HMO | Admitting: *Deleted

## 2022-07-12 ENCOUNTER — Telehealth: Payer: Self-pay

## 2022-07-12 NOTE — Telephone Encounter (Signed)
Attempted to contact patients son, no answer, LMTCB.

## 2022-07-12 NOTE — Telephone Encounter (Signed)
Patient son called in returning call and would like a call back

## 2022-07-12 NOTE — Telephone Encounter (Signed)
Following alert received from [employer] received for There were 4 NSVT arrhythmias detected and one VT episode that was successfully treated with one burst of ATP, sent to triage. Known PAF, on OAC, AF burden is 4.4% of the time.  The NSVT episodes were during AF.   Attempted to contact patient about ATP and medication compliance. Patient has hx of non-compliance with meds. No answer, LMTCB.

## 2022-07-13 ENCOUNTER — Encounter: Payer: Medicare HMO | Admitting: Physician Assistant

## 2022-07-13 ENCOUNTER — Telehealth (HOSPITAL_COMMUNITY): Payer: Self-pay | Admitting: Licensed Clinical Social Worker

## 2022-07-13 DIAGNOSIS — I10 Essential (primary) hypertension: Secondary | ICD-10-CM

## 2022-07-13 DIAGNOSIS — I5022 Chronic systolic (congestive) heart failure: Secondary | ICD-10-CM

## 2022-07-13 NOTE — Telephone Encounter (Signed)
CSW received referral to assist with some in home resources to support patient with medication compliance. CSW encouraged to contact patient's son to discuss home environment and support needs. CSW spoke with son who shared patient has some memory loss and unable to manage his medications. Patient's medications are in pill packs which is still confusing and hard for patient to remember. CSW suggested son call patient daily although he states that the patient is not able to follow the steps for compliance with just a phone call. Son states he is exploring placement and attempted to apply online for Medicaid last night with no success. CSW encouraged son to call DSS and begin the application over the phone to avoid long lines/waits at [place]. Patient's son agreeable. CSW also made referral to [place] [facility] for an RN Case Manager to follow up with possible suggestions in the home to assist with meditation compliance and disease management. Son grateful for the call and resources. Patient's son has CSW contact information for further assistance. Raquel Sarna, [place], [place]

## 2022-07-13 NOTE — Telephone Encounter (Signed)
Spoke with son, we do not have him on our [place].  He shared with me that he is coordinating his father's care as patient has horrible short term memory and he is trying to get him in a facility.  Son states he has POA. I have requested he bring a copy of this to 1/19 appt and have his dad update his DPR so that we can speak with him moving forward.   Son shares with me that his dad does not recall feeling poorly or having any abnormal event on 12/10 when the ATP occurred.  He does miss his medication about 50% of the time.   Patient is scheduled to see Marinus Maw PA-C on 08/17/22.  Son has to bring him and accompany him to his appts and cannot come any sooner.   Son says that his dad says he feels fine and has no symptoms.  He is aware that per [place] DMV state law, he is not to drive for next 6 mths.  Son says his dad shouldn't be driving period and will let him know.   Son is aware that if dad experiences any urgent emergent symptoms: chest pain, SOB/dizziness, syncopal or near syncopal event he should go to ER.

## 2022-07-16 ENCOUNTER — Ambulatory Visit (INDEPENDENT_AMBULATORY_CARE_PROVIDER_SITE_OTHER): Payer: Medicare HMO

## 2022-07-16 DIAGNOSIS — Z95810 Presence of automatic (implantable) cardiac defibrillator: Secondary | ICD-10-CM

## 2022-07-16 DIAGNOSIS — I5022 Chronic systolic (congestive) heart failure: Secondary | ICD-10-CM

## 2022-07-17 ENCOUNTER — Telehealth: Payer: Self-pay | Admitting: *Deleted

## 2022-07-17 NOTE — Progress Notes (Signed)
  Care Coordination   Note   07/17/2022 Name: WENDEL HOMEYER MRN: [id number] DOB: Jun 10, 1946  [name] is a 76 y.o. year old male who sees [name], Alyson Locket, NP for primary care. I reached out to [name] by phone today to offer care coordination services.  Referral received   Mr. Pilar was given information about Care Coordination services today including:   The Care Coordination services include support from the care team which includes your Nurse Coordinator, Clinical Social Worker, or Pharmacist.  The Care Coordination team is here to help remove barriers to the health concerns and goals most important to you. Care Coordination services are voluntary, and the patient may decline or stop services at any time by request to their care team member.   Care Coordination Consent Status: Patient agreed to services and verbal consent obtained.   Follow up plan:  Telephone appointment with care coordination team member scheduled for:  07/20/2022  Encounter Outcome:  Pt. Scheduled from referral   Julian Hy, [place] Direct Dial: [phone]

## 2022-07-18 ENCOUNTER — Telehealth: Payer: Self-pay

## 2022-07-18 NOTE — Chronic Care Management (AMB) (Addendum)
Chronic Care Management Pharmacy Assistant   Name: Randy [name]  MRN: [id number] DOB: 08/23/1945   Reason for Encounter: Medication Adherence and Delivery Coordination    Recent office visits:  06/27/22-Angelus [name],NP(PCP)-AWV, discussed screenings, vaccines,  labs(A1c 13.6) (Liver and kidney are stable. His sugar level was high in the blood work at 518. Thyroid and prostate numbers look good. His cholesterol is high. His triglycerides are elevated but this could be secondary to his elevated sugar levels. He needs to try and cut way back on all the sodas and sweet tea. I want water to be his main fluid intake. His A1C is 13.6!! ) ???Ttient was switched from Entresto to losartan patient switched from Coreg to metoprolol patient was switched from apixaban to Xarelto for his paroxysmal A-fib.DM foot UTD,  Referral to ophthalmology, referral to gasteroenterology , ear wash out  today,f/u 3 months.  Recent consult visits:  None since last CCM contact  Hospital visits:  None in previous 6 months  Medications: Outpatient Encounter Medications as of 07/18/2022  Medication Sig   Accu-Chek Softclix Lancets lancets USE AS DIRECTED  TO TEST BLOOD SUGAR ONE TIME DAILY  AND AS NEEDED   Alcohol Swabs (B-D SINGLE USE SWABS REGULAR) PADS Use as instructed to clean area for glucose monitoring once daily and as needed.  Diagnosis:  E11.21  Non insulin-dependent   allopurinol (ZYLOPRIM) 100 MG tablet TAKE 1/2 TABLET BY MOUTH EVERY MORNING   amiodarone (PACERONE) 200 MG tablet Take 1 tablet (200 mg total) by mouth daily.   atorvastatin (LIPITOR) 80 MG tablet TAKE 1 TABLET (80 MG TOTAL) DAILY AT 6 PM.   carvedilol (COREG) 3.125 MG tablet TAKE ONE TABLET BY MOUTH TWICE DAILY   donepezil (ARICEPT) 5 MG tablet TAKE ONE TABLET BY MOUTH EVERY EVENING   ezetimibe (ZETIA) 10 MG tablet TAKE ONE TABLET BY MOUTH EVERY MORNING   finasteride (PROSCAR) 5 MG tablet TAKE ONE TABLET BY MOUTH EVERY MORNING    furosemide (LASIX) 20 MG tablet TAKE ONE TABLET BY MOUTH EVERY MORNING   gabapentin (NEURONTIN) 300 MG capsule TAKE TWO CAPSULES BY MOUTH TWICE DAILY   glipiZIDE (GLUCOTROL XL) 10 MG 24 hr tablet TAKE ONE TABLET BY MOUTH EVERY MORNING   losartan (COZAAR) 25 MG tablet Take 1 tablet (25 mg total) by mouth daily.   metoprolol succinate (TOPROL XL) 25 MG 24 hr tablet Take 1 tablet (25 mg total) by mouth daily.   Rivaroxaban (XARELTO) 15 MG TABS tablet Take 1 tablet (15 mg total) by mouth daily with supper.   Semaglutide (RYBELSUS) 3 MG TABS Take 3 mg by mouth daily. [employer]   tamsulosin (FLOMAX) 0.4 MG CAPS capsule TAKE ONE CAPSULE BY MOUTH EVERY EVENING   No facility-administered encounter medications on file as of 07/18/2022.    BP Readings from Last 3 Encounters:  06/27/22 (!) 122/56  06/14/22 122/62  03/16/22 (!) 110/58    Pulse Readings from Last 3 Encounters:  06/27/22 (!) 53  06/14/22 73  03/16/22 73    Lab Results  Component Value Date/Time   HGBA1C 13.6 (H) 06/27/2022 12:35 PM   HGBA1C 9.1 (A) 03/16/2022 11:12 AM   HGBA1C 10.0 (A) 11/28/2021 12:31 PM   HGBA1C 8.9 (H) 03/10/2021 10:25 AM   HGBA1C 6.4 06/16/2018 09:19 AM   Lab Results  Component Value Date   CREATININE 1.83 (H) 06/27/2022   BUN 23 06/27/2022   GFR 35.37 (L) 06/27/2022   GFRNONAA 23 (L) 06/10/2019  GFRAA 26 (L) 06/10/2019   NA 132 (L) 06/27/2022   K 4.1 06/27/2022   CALCIUM 9.2 06/27/2022   CO2 28 06/27/2022     Last adherence delivery date:06/29/22      Patient is due for next adherence delivery on: 08/02/2022  Spoke with patient on 07/18/22 reviewed medications and coordinated delivery.  This delivery to include: Adherence Packaging  30 Days  Furosemide 20 mg - 1 daily Allopurinol 100 mg - 1/2 tablet daily  Tamsulosin 0.'4mg'$  - 1 daily  Finasteride 5 mg - 1 daily  Atorvastatin 80 mg - 1 tablet  Glipizide ER 10 mg - 1 daily   Zetia 10 mg- 1 tablet daily Donepezil '5mg'$  -1 tablet   Amiodarone '200mg'$  - 1 tablet Xarelto '15mg'$  - take 1 tablet daily Losartan '25mg'$  -take 1 tablet daily Metoprolol succinate '25mg'$  -take 1 tablet daily    Patient declined the following medications this month: Trulicity- patient unable to inject self weekly routinely    Any concerns about your medications? Yes Patients son reports compliant with taking medications 50% of time .  How often do you forget or accidentally miss a dose? Once a week- forgets to take   Do you use a pillbox? No  Is patient in packaging Yes  What is the date on your next pill pack? Not available to read date on pack   Any concerns or issues with your packaging? Satisfied    Refills requested from providers include: Glipizide  PCP  Confirmed delivery date of 08/02/2022, advised patient that pharmacy will contact them the morning of delivery.  Recent blood pressure readings are as follows:none available    Recent blood glucose readings are as follows:none available   Annual wellness visit in last year? Yes Most Recent BP reading:122/56  06/27/22  If Diabetic: Most recent A1C reading:13.6 Last eye exam / retinopathy screening:2021 Last diabetic foot exam:UTD  Cycle dispensing form sent to [facility], CPP for review. Next CCM appointment: 09/11/21  Charlene Brooke, CPP notified  [name], [place]  [phone]

## 2022-07-19 NOTE — Progress Notes (Signed)
EPIC Encounter for ICM Monitoring  Patient Name: Randy [name] is a 76 y.o. male Date: 07/19/2022 Primary Care Physican: Michela Pitcher, NP Primary Cardiologist: Lovena Le Electrophysiologist: Lovena Le 02/08/2022 Office Weight: 186 lbs   AT/AF Burden: 6.7%      Transmission reviewed.    [place] thoracic impedance suggesting normal fluid levels.    Prescribed: Furosemide 20 mg 1 tablet daily   Labs: 06/14/2022 Creatinine 1.68, BUN 23, Potassium 4.6, Sodium 134, GFR 42 03/16/2022 Creatinine 1.71, BUN 24, Potassium 3.7, Sodium 136, GFR 38.45 12/15/2021 Creatinine 1.73, BUN 25, Potassium 4.7, Sodium 135, GFR 37.98 12/01/2021 Creatinine 2.02, BUN 37, Potassium 5.3, Sodium 135, GFR 31.54  11/28/2021 Creatinine 2.39, BUN 32, Potassium 3.8, Sodium 134, GFR 25.78  A complete set of results can be found in Results Review.   Recommendations:  No changes.     Follow-up plan: ICM clinic phone appointment on 08/20/2022.  91 day device clinic remote transmission scheduled for 10/02/2022.    EP/Cardiology Office Visits:  08/17/2022 with Tommye Standard, PA.     Copy of ICM check sent to Dr. Lovena Le.     3 month ICM trend: 07/18/2022.    12-14 Month ICM trend:     Rosalene Billings, RN 07/19/2022 10:14 AM

## 2022-07-20 ENCOUNTER — Ambulatory Visit: Payer: Self-pay

## 2022-07-20 NOTE — Patient Instructions (Signed)
Visit Information  Thank you for taking time to visit with me today. Please don't hesitate to contact me if I can be of assistance to you.   Following are the goals we discussed today:   Goals Addressed             This Visit's Progress    Management/ education for health conditions       Care Coordination Interventions: Evaluation of current treatment plan related to Diabetes/ heart failure and patient's adherence to plan as established by provider:  Son states patient is not able to manage his health conditions due to his short term memory loss.  He states patients health is declining.  He states he manages and gives patient his medications but is only able to do one time per day because he works full time.  Son states patients most recent A1c was 13.6.  He neither he or patient is checking his blood sugars.  Son states patient is physically able to get around but just sits and watches tv all day.  Son states patient has to be reminded to eat, bathe, etc.  Son states patient has an ICD. Reviewed medications with patient and discussed importance of compliance Reviewed scheduled/upcoming provider appointments:  Per chart review patients next follow up visit with primary care provider is 2/ 29/24 Discussed plans with patient for ongoing care management follow up and provided patient with direct contact information for care management team:  Son verbally agreed to next telephone outreach call from [facility].  Education information sent to son/pt regarding diabetes and heart failure management.           Our next appointment is by telephone on 08/17/22 at 9:30 am  Please call the care guide team at [phone] if you need to cancel or reschedule your appointment.   If you are experiencing a Mental Health or [place] or need someone to talk to, please call the Suicide and Crisis Lifeline: 988 call 1-800-273-TALK (toll free, 24 hour hotline)  The patient verbalized  understanding of instructions, educational materials, and care plan provided today and agreed to receive a mailed copy of patient instructions, educational materials, and care plan.   Quinn Plowman RN,BSN,CCM [place] [phone] direct line  Heart Failure Action Plan A heart failure action plan helps you understand what to do when you have symptoms of heart failure. Your action plan is a color-coded plan that lists the symptoms to watch for and indicates what actions to take. If you have symptoms in the red zone, you need medical care right away. If you have symptoms in the yellow zone, you are having problems. If you have symptoms in the [place] zone, you are doing well. Follow the plan that was created by you and your health care provider. Review your plan each time you visit your health care provider. Red zone These signs and symptoms mean you should get medical help right away: You have trouble breathing when resting. You have a dry cough that is getting worse. You have swelling or pain in your legs or abdomen that is getting worse. You suddenly gain more than 2-3 lb (0.9-1.4 kg) in 24 hours, or more than 5 lb (2.3 kg) in a week. This amount may be more or less depending on your condition. You have trouble staying awake or you feel confused. You have chest pain. You do not have an appetite. You pass out. You have worsening sadness or depression. If you have any of  these symptoms, call your local emergency services (911 in the [place] right away. Do not drive yourself to the hospital. Yellow zone These signs and symptoms mean your condition may be getting worse and you should make some changes: You have trouble breathing when you are active, or you need to sleep with your head raised on extra pillows to help you breathe. You have swelling in your legs or abdomen. You gain 2-3 lb (0.9-1.4 kg) in 24 hours, or 5 lb (2.3 kg) in a week. This amount may be more or less depending  on your condition. You get tired easily. You have trouble sleeping. You have a dry cough. If you have any of these symptoms: Contact your health care provider within the next day. Your health care provider may adjust your medicines. [place] zone These signs mean you are doing well and can continue what you are doing: You do not have shortness of breath. You have very little swelling or no new swelling. Your weight is stable (no gain or loss). You have a normal activity level. You do not have chest pain or any other new symptoms. Follow these instructions at home: Take over-the-counter and prescription medicines only as told by your health care provider. Weigh yourself daily. Your target weight is __________ lb (__________ kg). Call your health care provider if you gain more than __________ lb (__________ kg) in 24 hours, or more than __________ lb (__________ kg) in a week. Health care provider name: _____________________________________________________ Health care provider phone number: _____________________________________________________ Eat a heart-healthy diet. Work with a diet and nutrition specialist (dietitian) to create an eating plan that is best for you. Keep all follow-up visits. This is important. Where to find more information American Heart Association: Summary A heart failure action plan helps you understand what to do when you have symptoms of heart failure. Follow the action plan that was created by you and your health care provider. Get help right away if you have any symptoms in the red zone. This information is not intended to replace advice given to you by your health care provider. Make sure you discuss any questions you have with your health care provider. Document Revised: 10/24/2021 Document Reviewed: 02/29/2020 Elsevier Patient Education  [place].  Heart Failure, Self-Care Heart failure is a serious condition. The following information explains  things you need to do to take care of yourself at home. To help you stay as healthy as possible, you may be asked to change your diet, take certain medicines, and make other changes in your life. Your doctor may also give you more specific instructions. If you have problems or questions, call your doctor. What are the risks? Having heart failure makes it more likely for you to have some problems. These problems can get worse if you do not take good care of yourself. Problems may include: Damage to the kidneys, liver, or lungs. Malnutrition. Abnormal heart rhythms. Blood clotting problems that could cause a stroke. Supplies needed: Scale for weighing yourself. Blood pressure monitor. Notebook. Medicines. How to care for yourself when you have heart failure Medicines Take over-the-counter and prescription medicines only as told by your doctor. Take your medicines every day. Do not stop taking your medicine unless your doctor tells you to do so. Do not skip any medicines. Get your prescriptions refilled before you run out of medicine. This is important. Talk with your doctor if you cannot afford your medicines. Eating and drinking  Eat heart-healthy foods. Talk with a  diet specialist (dietitian) to create an eating plan. Limit salt (sodium) if told by your doctor. Ask your diet specialist to tell you which seasonings are healthy for your heart. Cook in healthy ways instead of frying. Healthy ways of cooking include roasting, grilling, broiling, baking, poaching, steaming, and stir-frying. Choose foods that: Have no trans fat. Are low in saturated fat and cholesterol. Choose healthy foods, such as: Fresh or frozen fruits and vegetables. Fish. Low-fat (lean) meats. Legumes, such as beans, peas, and lentils. Fat-free or low-fat dairy products. Whole-grain foods. High-fiber foods. Limit how much fluid you drink, if told by your doctor. Alcohol use Do not drink alcohol if: Your doctor  tells you not to drink. Your heart was damaged by alcohol, or you have very bad heart failure. You are pregnant, may be pregnant, or are planning to become pregnant. If you drink alcohol: Limit how much you have to: 0-1 drink a day for women. 0-2 drinks a day for men. Know how much alcohol is in your drink. In the [place] one drink equals one 12 oz bottle of beer (355 mL), one 5 oz glass of wine (148 mL), or one 1 oz glass of hard liquor (44 mL). Lifestyle  Do not smoke or use any products that contain nicotine or tobacco. If you need help quitting, ask your doctor. Do not use nicotine gum or patches before talking to your doctor. Do not use illegal drugs. Lose weight if told by your doctor. Do physical activity if told by your doctor. Talk to your doctor before you begin an exercise if: You are an older adult. You have very bad heart failure. Learn to manage stress. If you need help, ask your doctor. Get physical rehab (rehabilitation) to help you stay independent and to help with your quality of life. Participate in a cardiac rehab program. This program helps you improve your health through exercise, education, and counseling. Plan time to rest when you get tired. Check weight and blood pressure  Weigh yourself every day. This will help you to know if fluid is building up in your body. Weigh yourself every morning after you pee (urinate) and before you eat breakfast. Wear the same amount of clothing each time. Write down your daily weight. Give your record to your doctor. Check and write down your blood pressure as told by your doctor. Check your pulse as told by your doctor. Dealing with very hot and very cold weather If it is very hot: Avoid activities that take a lot of energy. Use air conditioning or fans, or find a cooler place. Avoid caffeine and alcohol. Wear clothing that is loose-fitting, lightweight, and light-colored. If it is very cold: Avoid activities that take a  lot of energy. Layer your clothes. Wear mittens or gloves, a hat, and a face covering when you go outside. Avoid alcohol. Follow these instructions at home: Stay up to date with shots (vaccines). Get pneumococcal and flu (influenza) shots. Keep all follow-up visits. Contact a doctor if: You gain 2-3 lb (1-1.4 kg) in 24 hours or 5 lb (2.3 kg) in a week. You have increasing shortness of breath. You cannot do your normal activities. You get tired easily. You cough a lot. You do not feel like eating or feel like you may vomit (nauseous). You have swelling in your hands, feet, ankles, or belly (abdomen). You cannot sleep well because it is hard to breathe. You feel like your heart is beating fast (palpitations). You get dizzy when you  stand up. You feel depressed or sad. Get help right away if: You have trouble breathing. You or someone else notices a change in your behavior, such as having trouble staying awake. You have chest pain or discomfort. You pass out (faint). These symptoms may be an emergency. Get help right away. Call your local emergency services (911 in the [place] Do not wait to see if the symptoms will go away. Do not drive yourself to the hospital. Summary Heart failure is a serious condition. To care for yourself, you may have to change your diet, take medicines, and make other lifestyle changes. Take your medicines every day. Do not stop taking them unless your doctor tells you to do so. Limit salt and eat heart-healthy foods. Ask your doctor if you can drink alcohol. You may have to stop alcohol use if you have very bad heart failure. Contact your doctor if you gain weight quickly or feel that your heart is beating too fast. Get help right away if you pass out or have chest pain or trouble breathing. This information is not intended to replace advice given to you by your health care provider. Make sure you discuss any questions you have with your health care  provider. Document Revised: 02/06/2020 Document Reviewed: 02/06/2020 Elsevier Patient Education  [place].  Type 2 Diabetes Mellitus, Self-Care, Adult Caring for yourself after you have been diagnosed with type 2 diabetes (type 2 diabetes mellitus) means keeping your blood sugar (glucose) under control with a balance of: Nutrition. Exercise. Lifestyle changes. Medicines or insulin, if needed. Support from your team of health care providers and others. What are the risks? Having type 2 diabetes can put you at risk for other long-term (chronic) conditions, such as heart disease and kidney disease. Your health care provider may prescribe medicines to help prevent complications from diabetes. How to monitor your blood glucose  Check your blood glucose every day or as often as told by your health care provider. Have your A1C (hemoglobin A1C) level checked two or more times a year, or as often as told by your health care provider. Your health care provider will set personalized treatment goals for you. Generally, the goal of treatment is to maintain the following blood glucose levels: Before meals: 80-130 mg/dL (4.4-7.2 mmol/L). After meals: below 180 mg/dL (10 mmol/L). A1C level: less than 7%. How to manage hyperglycemia and hypoglycemia Hyperglycemia symptoms Hyperglycemia, also called high blood glucose, occurs when blood glucose is too high. Make sure you know the early signs of hyperglycemia, such as: Increased thirst. Hunger. Feeling very tired. Needing to urinate more often than usual. Blurry vision. Hypoglycemia symptoms Hypoglycemia, also called low blood glucose, occurs with a blood glucose level at or below 70 mg/dL (3.9 mmol/L). Diabetes medicines lower your blood glucose and can cause hypoglycemia. The risk for hypoglycemia increases during or after exercise, during sleep, during illness, and when skipping meals or not eating for a long time (fasting). It is important  to know the symptoms of hypoglycemia and treat it right away. Always have a 15-gram rapid-acting carbohydrate snack with you to treat low blood glucose. Family members and close friends should also know the symptoms and understand how to treat hypoglycemia, in case you are not able to treat yourself. Symptoms may include: Hunger. Anxiety. Sweating and feeling clammy. Dizziness or feeling light-headed. Sleepiness. Increased heart rate. Irritability. Tingling or numbness around the mouth, lips, or tongue. Restless sleep. Severe hypoglycemia is when your blood glucose level is  at or below 54 mg/dL (3 mmol/L). Severe hypoglycemia is an emergency. Do not wait to see if the symptoms will go away. Get medical help right away. Call your local emergency services (911 in the [place] Do not drive yourself to the hospital. If you have severe hypoglycemia and you cannot eat or drink, you may need glucagon. A family member or close friend should learn how to check your blood glucose and how to give you glucagon. Ask your health care provider if you need to have an emergency glucagon kit available. Follow these instructions at home: Medicines Take prescribed insulin or diabetes medicines as told by your health care provider. Do not run out of insulin or other diabetes medicines. Plan ahead so you always have these available. If you use insulin, adjust your dosage based on your physical activity and what foods you eat. Your health care provider will tell you how to adjust your dosage. Take over-the-counter and prescription medicines only as told by your health care provider. Eating and drinking  What you eat and drink affects your blood glucose and your insulin dosage. Making good choices helps to control your diabetes and prevent other health problems. A healthy meal plan includes eating lean proteins, complex carbohydrates, fresh fruits and vegetables, low-fat dairy products, and healthy fats. Make an  appointment to see a registered dietitian to help you create an eating plan that is right for you. Make sure that you: Follow instructions from your health care provider about eating or drinking restrictions. Drink enough fluid to keep your urine pale yellow. Keep a record of the carbohydrates that you eat. Do this by reading food labels and learning the standard serving sizes of foods. Follow your sick-day plan whenever you cannot eat or drink as usual. Make this plan in advance with your health care provider.  Activity Stay active. Exercise regularly, as told by your health care provider. This may include: Stretching and doing strength exercises, such as yoga or weight lifting, two or more times a week. Doing 150 minutes or more of moderate-intensity or vigorous-intensity exercise each week. This could be brisk walking, biking, or water aerobics. Spread out your activity over 3 or more days of the week. Do not go more than 2 days in a row without doing some kind of physical activity. When you start a new exercise or activity, work with your health care provider to adjust your insulin, medicines, or food intake as needed. Lifestyle Do not use any products that contain nicotine or tobacco. These products include cigarettes, chewing tobacco, and vaping devices, such as e-cigarettes. If you need help quitting, ask your health care provider. If you drink alcohol and your health care provider says that it is safe for you: Limit how much you have to: 0-1 drink a day for women who are not pregnant. 0-2 drinks a day for men. Know how much alcohol is in your drink. In the [place] one drink equals one 12 oz bottle of beer (355 mL), one 5 oz glass of wine (148 mL), or one 1 oz glass of hard liquor (44 mL). Learn to manage stress. If you need help with this, ask your health care provider. Take care of your body  Keep your immunizations up to date. In addition to getting vaccinations as told by your health  care provider, it is recommended that you get vaccinated against the following illnesses: The flu (influenza). Get a flu shot every year. Pneumonia. Hepatitis B. Schedule an eye exam soon  after your diagnosis, and then one time every year after that. Check your skin and feet every day for cuts, bruises, redness, blisters, or sores. Schedule a foot exam with your health care provider once every year. Brush your teeth and gums two times a day, and floss one or more times a day. Visit your dentist one or more times every 6 months. Maintain a healthy weight. General instructions Share your diabetes management plan with people in your workplace, school, and household. Carry a medical alert card or wear medical alert jewelry. Keep all follow-up visits. This is important. Questions to ask your health care provider Should I meet with a certified diabetes care and education specialist? Where can I find a support group for people with diabetes? Where to find more information For help and guidance and for more information about diabetes, please visit: American Diabetes Association (ADA): www.diabetes.org American Association of Diabetes Care and Education Specialists (ADCES): www.diabeteseducator.org International Diabetes Federation (IDF): MemberVerification.ca Summary Caring for yourself after you have been diagnosed with type 2 diabetes (type 2 diabetes mellitus) means keeping your blood sugar (glucose) under control with a balance of nutrition, exercise, lifestyle changes, and medicine. Check your blood glucose every day, as often as told by your health care provider. Having diabetes can put you at risk for other long-term (chronic) conditions, such as heart disease and kidney disease. Your health care provider may prescribe medicines to help prevent complications from diabetes. Share your diabetes management plan with people in your workplace, school, and household. Keep all follow-up visits. This is  important. This information is not intended to replace advice given to you by your health care provider. Make sure you discuss any questions you have with your health care provider. Document Revised: 12/14/2020 Document Reviewed: 12/14/2020 Elsevier Patient Education  [place].

## 2022-07-20 NOTE — Patient Outreach (Signed)
  Care Coordination   Follow Up Visit Note   07/20/2022 Name: Randy [name] MRN: [id number] DOB: 1946/05/04  Randy [name] is a 76 y.o. year old male who sees [name], Alyson Locket, NP for primary care. I spoke with  Randy [name] by phone today.  What matters to the patients health and wellness today?  Education regarding health conditions    Goals Addressed             This Visit's Progress    Management/ education for health conditions       Care Coordination Interventions: Evaluation of current treatment plan related to Diabetes/ heart failure and patient's adherence to plan as established by provider:  Son states patient is not able to manage his health conditions due to his short term memory loss.  He states patients health is declining.  He states he manages and gives patient his medications but is only able to do one time per day because he works full time.  Son states patients most recent A1c was 13.6.  He neither he or patient is checking his blood sugars.  Son states patient is physically able to get around but just sits and watches tv all day.  Son states patient has to be reminded to eat, bathe, etc.  Son states patient has an ICD. Reviewed medications with patient and discussed importance of compliance Reviewed scheduled/upcoming provider appointments:  Per chart review patients next follow up visit with primary care provider is 2/ 29/24 Discussed plans with patient for ongoing care management follow up and provided patient with direct contact information for care management team:  Son verbally agreed to next telephone outreach call from [facility].  Education information sent to son/pt regarding diabetes and heart failure management.           SDOH assessments and interventions completed:  Yes  SDOH Interventions Today    Flowsheet Row Most Recent Value  SDOH Interventions   Food Insecurity Interventions Intervention Not Indicated  Housing Interventions Intervention Not  Indicated  Transportation Interventions Intervention Not Indicated        Care Coordination Interventions:  Yes, provided   Follow up plan: Follow up call scheduled for 08/17/22    Encounter Outcome:  Pt. Visit Completed   Quinn Plowman RN,BSN,CCM [place] [phone] direct line

## 2022-07-22 ENCOUNTER — Other Ambulatory Visit: Payer: Self-pay | Admitting: Nurse Practitioner

## 2022-07-22 DIAGNOSIS — E1121 Type 2 diabetes mellitus with diabetic nephropathy: Secondary | ICD-10-CM

## 2022-07-24 ENCOUNTER — Ambulatory Visit: Payer: Self-pay | Admitting: *Deleted

## 2022-07-24 NOTE — Patient Outreach (Signed)
  Care Coordination   Follow Up Visit Note   07/24/2022 Name: HUBBARD SELDON MRN: [id number] DOB: 1946-02-17  [name] is a 76 y.o. year old male who sees [name], Alyson Locket, NP for primary care. I spoke with  [name] son by phone today.  What matters to the patients health and wellness today?  Memory care placement    Goals Addressed             This Visit's Progress    Medicaid application process       Care Coordination Interventions: Phone call from patient's son to follow up on medicaid application process and long term care options Patient's son states that patient's confusion continues to progress, he is not taking his medication correctly, having difficulty completing his ADL's and IADL's Confirmed contact with the Department of Social Services, long term medicaid process started, son is waiting on a response from them regarding additional documentation needed Patient's son confirms that he has spoken to patient about memory care placement -his agreeance tends to fluctuate Continued discussion around memory care placement encouraged        SDOH assessments and interventions completed:  No     Care Coordination Interventions:  Yes, provided   Follow up plan: Follow up call scheduled for 08/21/22    Encounter Outcome:  Pt. Visit Completed

## 2022-07-24 NOTE — Patient Instructions (Signed)
Visit Information  Thank you for taking time to visit with me today. Please don't hesitate to contact me if I can be of assistance to you.   Following are the goals we discussed today:   Goals Addressed             This Visit's Progress    Medicaid application process       Care Coordination Interventions: Phone call from patient's son to follow up on medicaid application process and long term care options Patient's son states that patient's confusion continues to progress, he is not taking his medication correctly, having difficulty completing his ADL's and IADL's Confirmed contact with the Department of Social Services, long term medicaid process started, son is waiting on a response from them regarding additional documentation needed Patient's son confirms that he has spoken to patient about memory care placement -his agreeance tends to fluctuate Continued discussion around memory care placement encouraged        Our next appointment is by telephone on 08/21/22 at 3pm  Please call the care guide team at [phone] if you need to cancel or reschedule your appointment.   If you are experiencing a Mental Health or [place] or need someone to talk to, please call 911   Patient verbalizes understanding of instructions and care plan provided today and agrees to view in [place]. Active MyChart status and patient understanding of how to access instructions and care plan via MyChart confirmed with patient.     Telephone follow up appointment with care management team member scheduled for: 08/21/22   Elliot Gurney, [place] Worker  [facility] Care Management [phone]

## 2022-07-30 ENCOUNTER — Other Ambulatory Visit: Payer: Self-pay | Admitting: Internal Medicine

## 2022-07-30 ENCOUNTER — Other Ambulatory Visit: Payer: Self-pay | Admitting: Student

## 2022-07-30 DIAGNOSIS — I472 Ventricular tachycardia, unspecified: Secondary | ICD-10-CM

## 2022-08-01 NOTE — Progress Notes (Signed)
Remote ICD transmission.   

## 2022-08-14 NOTE — Progress Notes (Signed)
Cardiology Office Note Date:  08/17/2022  Patient ID:  Randy [name], DOB 1945-08-04, MRN [id number] PCP:  Michela Pitcher, NP  Electrophysiologist: Dr. Lovena Le    Chief Complaint: planned f/u, VT  History of Present Illness: Randy [name] is a 77 y.o. male with history of ICM, chronic CHF (systolic), VT, AFib, CAD (CABG 2019), CKD (III), HTN, HLD, DM  He comes in today to be seen for Dr. Lovena Le, last seen by him June 2021, no VT/maintaining SR off amiodarone, no changes were made with class II symptoms  He saw [name] Dec 2022 he had been shocked by his ICD several weeks prior, no clear syncope or symptoms, amiodarone was resumed, his son reported he often missed his AM meds.  NO-showed to 2 scheduled f/u visits  Device remote ATPs felt to be inappropriate in review with EP in the office, for rapid AFlutter, called to come in.    I saw him 02/08/22 He comes accompanied by his son. The patient resides independently, is known to have some degree of dementia, no longer drives/goes out alone, admittedly he agrees with his son, would likely get lost. He feels like for the most part he gets his AM meds taken perhaps not 100%, but more often then not, his PM/afternoon medicines perhaps infrequent at least, perhaps rarely, says he just forgets, it is not an intentional non-compliance with medicines. His son says that when they find un-taken pills, and the pt can not recall if he took others or not and worry about over dosing pills, so they go ahead and skip. The patient denies any symptoms As far as the don knows, he has never complained to him about anything/any symptoms No reports of falls or faints No CP, SOB He does not exercise, lives a pretty sedentary life, but denies symptoms or difficulties with ADLs  He was in AFlutter that had just started that day, no further/new treated episodes Felt medication noncompliance driving/contributing perhaps difficulty with rhythm  management Discussed strategies.reminders to help with meds His son did later call and confirm amiodarone was in his pill pack Discussed need for gen cards for his other cardiac management. Questioned perhaps change to xarelto to try and avoid BID meds to help with compliance  03/21/22 further ATP tx for rapid AFlutter, presenting rhythm was AS/VS d/w Dr. Lovena Le, no med changes  04/20/22, TRUE VT episodes (x2), both success with ATP, device RN d/w pt's son, no reported symptoms to him at least, revisited likely missed meds.  Looks like [facility] social worker has rechout unable to get the patient.  Now getting pill packs done, though still missing PM doses, RPH with his PMD reached out asking if any of his BID meds can be changed to daily to help with medication adherence. Also noted that he had been out of.not taking Entresto, perhaps since August though unclear.  Did not typically lower BPs last few visits Recs by them were to try  change carvedilol 3.125 mg to metoprolol succinate 12.5 mg daily  -change Eliquis to Xarelto 15 mg daily -Hold Entresto until BP can be monitored more consistently  I saw him 06/14/22 He is accompanied by his son. He admits to poor memory, they both estimate perhaps 50% medication compliance. He denies any kind of symptoms, does think he would remember symptoms if he had them, he has never mentioned any concerns or symptoms to his son. No CP, palpitations or cardiac awareness No dizzy spells, near syncope or syncope  No bleeding or signs of bleeding No further VT In effort to simplify his regime changes made Eliquis > Xarelto '15mg'$  daily Coreg > Toprol '25mg'$  daily Entresto > losartan '25mg'$  daily And urged compliance and familial assistance, PMD/team also trying to help, also rached out to our SW to see if any further assistance recourses were available to them. Discussed if more VT with medication compliance would need to consider cath.  Contact in December with  social worker, son was having trouble but in the process of trying to find placement with ongoing problems with him being able to take his medications, referral to [facility] care management as well.  The process of funding and placement is still ongoing with continued reports of medication noncompliance and confusion.  TODAY He is accompanied by his son. No new symptoms or complaints His son continues to work with the social workers to get his medicaid and hopefully ALF set up. His medication compliance remains poor in general, as often as his son get get by is probably as often as he takes his medicines.  They have a calendar and his meds set as a system to take and check off his meds, but probably a 1/4 of the time forgets. He has never accidentally taken extra/doubled up No reports of any shocks, syncope, falls No no CP Some DOE with heavier activities none with usual ADLs or at rest No bleeding or signs of bleeding    Device information Abbott single chamber ICD implanted 05/20/2008 >> gen change and addition of RA lead 10/18/2016 + appropriate therapy  Amiodarone stopped 2021 after had run out and been off come months it seem (unclear)  Dec 2022 with VT treated w/HV tx. Amiodarone restarted Dec 2022   Has had inappropriate ATP as well for RVR  Past Medical History:  Diagnosis Date   AICD (automatic cardioverter/defibrillator) present 10/18/2016   "replaced 2009-[employer] device, Dr Lovena Le"   Anemia    Arthritis    "back" (08/27/2017)   CAD (coronary artery disease)    CHF (congestive heart failure) (HCC)    CKD (chronic kidney disease) stage 3, GFR 30-59 ml/min (HCC)    [name]   Diverticulosis    Dyslipidemia    GERD (gastroesophageal reflux disease)    Hypertension    Neuromuscular disorder (HCC)    neuropathy- in hands   Osteoarthritis    hips, hands   Pacemaker    Shortness of breath    Sleep apnea    started a sleep study but didn't completed, states PCP told him he  has sleep  apnea, doesn't use CPAP   Systolic dysfunction    Type 2 diabetes with nephropathy (HCC)    Vitamin D deficiency     Past Surgical History:  Procedure Laterality Date   ANTERIOR CERVICAL DECOMP/DISCECTOMY FUSION     BACK SURGERY     CARPAL TUNNEL RELEASE Left    CORONARY ARTERY BYPASS GRAFT N/A 09/02/2017   Procedure: CORONARY ARTERY BYPASS GRAFTING (CABG) x three , using left internal mammary artery and right leg greater saphenous vein harvested endoscopically;  Surgeon: [name], MD;  Location: [facility] OR;  Service: Open Heart Surgery;  Laterality: N/A;   CYST EXCISION     "back"   ICD IMPLANT N/A 10/18/2016   Procedure: ICD Implant- Upgrade to Dual Chamber;  Surgeon: Evans Lance, MD;  Location: [facility];  Service: Cardiovascular;  Laterality: N/A;   INSERT / REPLACE / REMOVE PACEMAKER  JOINT REPLACEMENT     LEFT HEART CATH AND CORONARY ANGIOGRAPHY N/A 08/30/2017   Procedure: LEFT HEART CATH AND CORONARY ANGIOGRAPHY;  Surgeon: Martinique, Peter M, MD;  Location: [facility];  Service: Cardiovascular;  Laterality: N/A;   LUMBAR LAMINECTOMY Left 07/17/2013   Procedure: MICRODISCECTOMY LUMBAR LAMINECTOMY;  Surgeon: Marybelle Killings, MD;  Location: [facility];  Service: Orthopedics;  Laterality: Left;  Left L4 Laminotomy, Lateral Recess Decompression, Cyst Excision   SHOULDER OPEN ROTATOR CUFF REPAIR Left    TEE WITHOUT CARDIOVERSION N/A 09/02/2017   Procedure: TRANSESOPHAGEAL ECHOCARDIOGRAM (TEE);  Surgeon: [name], MD;  Location: [facility];  Service: Open Heart Surgery;  Laterality: N/A;   TOTAL HIP ARTHROPLASTY Left 09/17/2012   Procedure: Left TOTAL HIP ARTHROPLASTY ANTERIOR APPROACH;  Surgeon: Marybelle Killings, MD;  Location: [facility];  Service: Orthopedics;  Laterality: Left;    Current Outpatient Medications  Medication Sig Dispense Refill   Accu-Chek Softclix Lancets lancets USE AS DIRECTED  TO TEST BLOOD SUGAR ONE TIME DAILY  AND AS NEEDED 200 each 2   Alcohol  Swabs (B-D SINGLE USE SWABS REGULAR) PADS Use as instructed to clean area for glucose monitoring once daily and as needed.  Diagnosis:  E11.21  Non insulin-dependent 100 each 3   allopurinol (ZYLOPRIM) 100 MG tablet TAKE 1/2 TABLET BY MOUTH EVERY MORNING 15 tablet 2   amiodarone (PACERONE) 200 MG tablet TAKE ONE TABLET BY MOUTH AT NOON 60 tablet 0   atorvastatin (LIPITOR) 80 MG tablet TAKE ONE TABLET BY MOUTH AT NOON 90 tablet 0   carvedilol (COREG) 3.125 MG tablet TAKE ONE TABLET BY MOUTH TWICE DAILY 180 tablet 3   donepezil (ARICEPT) 5 MG tablet TAKE ONE TABLET BY MOUTH EVERY EVENING 90 tablet 1   ezetimibe (ZETIA) 10 MG tablet TAKE ONE TABLET BY MOUTH EVERY MORNING 90 tablet 2   finasteride (PROSCAR) 5 MG tablet TAKE ONE TABLET BY MOUTH EVERY MORNING 30 tablet 5   furosemide (LASIX) 20 MG tablet TAKE ONE TABLET BY MOUTH EVERY MORNING 30 tablet 2   gabapentin (NEURONTIN) 300 MG capsule TAKE TWO CAPSULES BY MOUTH TWICE DAILY 120 capsule 2   glipiZIDE (GLUCOTROL XL) 10 MG 24 hr tablet TAKE ONE TABLET BY MOUTH AT NOON 90 tablet 1   losartan (COZAAR) 25 MG tablet Take 1 tablet (25 mg total) by mouth daily. 30 tablet 6   metoprolol succinate (TOPROL XL) 25 MG 24 hr tablet Take 1 tablet (25 mg total) by mouth daily. 30 tablet 6   Rivaroxaban (XARELTO) 15 MG TABS tablet Take 1 tablet (15 mg total) by mouth daily with supper. 30 tablet 6   Semaglutide (RYBELSUS) 3 MG TABS Take 3 mg by mouth daily. [employer]     tamsulosin (FLOMAX) 0.4 MG CAPS capsule TAKE ONE CAPSULE BY MOUTH EVERY EVENING 30 capsule 5   No current facility-administered medications for this visit.    Allergies:   Patient has no known allergies.   Social History:  The patient  reports that he quit smoking about 38 years ago. His smoking use included cigarettes. He has a 34.00 pack-year smoking history. He has never used smokeless tobacco. He reports current alcohol use. He reports that he does not use drugs.   Family History:   The patient's family history includes Diabetes in his brother; Heart attack (age of onset: 68) in his father.  ROS:  Please see the history of present illness.    All other  systems are reviewed and otherwise negative.   PHYSICAL EXAM:  VS:  BP 96/60 Comment: BP right arm 90/54  Pulse 76   Ht '5\' 9"'$  (1.753 m)   Wt 181 lb 12.8 oz (82.5 kg)   SpO2 97%   BMI 26.85 kg/m  BMI: Body mass index is 26.85 kg/m. Well nourished, well developed, in no acute distress, has an unkept appearance HEENT: normocephalic, atraumatic Neck: no JVD, carotid bruits or masses Cardiac: RRR; 1-2/6SM, no rubs, or gallops Lungs:   CTA b/l, no wheezing, rhonchi or rales Abd: soft, nontender MS: no deformity or atrophy Ext: no edema Skin: warm and dry, no rash Neuro:  No gross deficits appreciated Psych: euthymic mood, full affect  ICD site is stable, no tethering or discomfort   EKG:  not done today  Device interrogation done today and reviewed by myself:  Battery and lead measurements are stable RV lead impedance is on a slow upwards trend, still ok RV threshold as well on a very slow trend up, though remains stable, PW adjusted to keep 2:1, he VP only 3.4% No recurrent VT AF burden 7.8%   08/30/2017: LHC Prox RCA lesion is 90% stenosed. Mid RCA lesion is 100% stenosed. Ost LM to Mid LM lesion is 85% stenosed. Ost LAD to Prox LAD lesion is 50% stenosed. Ost 1st Diag to 1st Diag lesion is 70% stenosed. Ost Ramus to Ramus lesion is 75% stenosed. Ost Cx to Prox Cx lesion is 99% stenosed. Ost 1st Mrg lesion is 100% stenosed. LV end diastolic pressure is normal.   1. Severe 3 vessel and left main obstructive CAD 2. Normal LVEDP   08/27/2017: TTE Study Conclusions  - Left ventricle: The cavity size was normal. Systolic function was    severely reduced. The estimated ejection fraction was in the    range of 25% to 30%. Diffuse hypokinesis. Features are consistent    with a pseudonormal left  ventricular filling pattern, with    concomitant abnormal relaxation and increased filling pressure    (grade 2 diastolic dysfunction). Doppler parameters are    consistent with high ventricular filling pressure.  - Aortic valve: Transvalvular velocity was within the normal range.    There was no stenosis. There was no regurgitation.  - Mitral valve: Transvalvular velocity was within the normal range.    There was no evidence for stenosis. There was trivial    regurgitation.  - Left atrium: The atrium was mildly dilated.  - Right ventricle: The cavity size was normal. Wall thickness was    normal. Systolic function was normal.  - Tricuspid valve: There was trivial regurgitation.  - Pulmonary arteries: Systolic pressure was within the normal    range. PA peak pressure: 18 mm Hg (S).    02/09/2008: TTE SUMMARY   -  The left ventricle was mildly dilated. Overall left ventricular         systolic function was mildly decreased. Left ventricular         ejection fraction was estimated to be 40 %. There was         akinesis of the entire inferoseptal wall. There was akinesis         of the entire inferior wall. There was akinesis of the         basal-mid posterior wall.   -  The aortic valve was mildly calcified. There was mildly reduced         aortic valve leaflet excursion.   Recent  Labs: 06/14/2022: Magnesium 1.8 06/27/2022: ALT 8; BUN 23; Creatinine, Ser 1.83; Hemoglobin 12.9; Platelets 197.0; Potassium 4.1; Sodium 132; TSH 1.17  06/27/2022: Cholesterol 290; Direct LDL 155.0; HDL 36.30; Total CHOL/HDL Ratio 8; Triglycerides 770.0 Triglyceride is over 400; calculations on Lipids are invalid.   CrCl cannot be calculated (Patient's most recent lab result is older than the maximum 21 days allowed.).   Wt Readings from Last 3 Encounters:  08/17/22 181 lb 12.8 oz (82.5 kg)  06/27/22 185 lb 4 oz (84 kg)  06/14/22 186 lb 3.2 oz (84.5 kg)     Other studies reviewed: Additional  studies/records reviewed today include: summarized above  ASSESSMENT AND PLAN:  ICD Intact function programming changes as above Follow RV lead  VT Resumed on amiodarone Dec 2022 None since last visit  Hopefully we can get better medication compliance (discussed below), if he has more VT and actually taking his medicines, I think we need to think about a cath  Paroxysmal Afib, AFlutter CHA2DS2Vasc is 6, on Eliquis, appropriately dosed 7.8 % burden  CAD No reported anginal symptoms Son reports his PMD does his labs, and manages lipids  ICM Chronic CHF (systolic) No exam findings of volume OL CorVue looks OK  HTN Repeat BP today is 100.60, follow, he has done well with his current meds, we may need to peel back some  Medication non-compliance Discussed/urged more help with medication compliance They are working on solutions, oue SW as well as him PMDs office/staff     Disposition: back in 12mo sooner if needed  Current medicines are reviewed at length with the patient today.  The patient did not have any concerns regarding medicines.  SVenetia Night PA-C 08/17/2022 8:51 AM     [facility] [address] [place] [phone])  [phone])

## 2022-08-17 ENCOUNTER — Encounter: Payer: Self-pay | Admitting: Physician Assistant

## 2022-08-17 ENCOUNTER — Ambulatory Visit: Payer: Medicare HMO | Attending: Physician Assistant | Admitting: Physician Assistant

## 2022-08-17 ENCOUNTER — Ambulatory Visit: Payer: Self-pay

## 2022-08-17 VITALS — BP 96/60 | HR 76 | Ht 69.0 in | Wt 181.8 lb

## 2022-08-17 DIAGNOSIS — I255 Ischemic cardiomyopathy: Secondary | ICD-10-CM | POA: Diagnosis not present

## 2022-08-17 DIAGNOSIS — I10 Essential (primary) hypertension: Secondary | ICD-10-CM

## 2022-08-17 DIAGNOSIS — I5022 Chronic systolic (congestive) heart failure: Secondary | ICD-10-CM

## 2022-08-17 DIAGNOSIS — Z79899 Other long term (current) drug therapy: Secondary | ICD-10-CM | POA: Diagnosis not present

## 2022-08-17 DIAGNOSIS — I480 Paroxysmal atrial fibrillation: Secondary | ICD-10-CM | POA: Diagnosis not present

## 2022-08-17 DIAGNOSIS — I472 Ventricular tachycardia, unspecified: Secondary | ICD-10-CM | POA: Diagnosis not present

## 2022-08-17 DIAGNOSIS — I2510 Atherosclerotic heart disease of native coronary artery without angina pectoris: Secondary | ICD-10-CM | POA: Diagnosis not present

## 2022-08-17 DIAGNOSIS — Z95810 Presence of automatic (implantable) cardiac defibrillator: Secondary | ICD-10-CM | POA: Diagnosis not present

## 2022-08-17 LAB — CUP PACEART INCLINIC DEVICE CHECK
Battery Remaining Longevity: 43 mo
Brady Statistic RA Percent Paced: 0.42 %
Brady Statistic RV Percent Paced: 3.4 %
Date Time Interrogation Session: 20240119090137
HighPow Impedance: 78.75 Ohm
Implantable Lead Connection Status: 753985
Implantable Lead Connection Status: 753985
Implantable Lead Implant Date: 20091022
Implantable Lead Implant Date: 20180322
Implantable Lead Location: 753859
Implantable Lead Location: 753860
Implantable Lead Model: 7121
Implantable Pulse Generator Implant Date: 20180322
Lead Channel Impedance Value: 1900 Ohm
Lead Channel Impedance Value: 300 Ohm
Lead Channel Pacing Threshold Amplitude: 0.75 V
Lead Channel Pacing Threshold Amplitude: 0.75 V
Lead Channel Pacing Threshold Amplitude: 1.5 V
Lead Channel Pacing Threshold Amplitude: 1.5 V
Lead Channel Pacing Threshold Pulse Width: 0.5 ms
Lead Channel Pacing Threshold Pulse Width: 0.5 ms
Lead Channel Pacing Threshold Pulse Width: 1 ms
Lead Channel Pacing Threshold Pulse Width: 1 ms
Lead Channel Sensing Intrinsic Amplitude: 1.6 mV
Lead Channel Sensing Intrinsic Amplitude: 12 mV
Lead Channel Setting Pacing Amplitude: 2 V
Lead Channel Setting Pacing Amplitude: 3 V
Lead Channel Setting Pacing Pulse Width: 1 ms
Lead Channel Setting Sensing Sensitivity: 0.5 mV
Pulse Gen Serial Number: 7413245

## 2022-08-17 LAB — TSH: TSH: 1.3 u[IU]/mL (ref 0.450–4.500)

## 2022-08-17 LAB — HEPATIC FUNCTION PANEL
ALT: 6 IU/L (ref 0–44)
AST: 11 IU/L (ref 0–40)
Albumin: 4.4 g/dL (ref 3.8–4.8)
Alkaline Phosphatase: 79 IU/L (ref 44–121)
Bilirubin Total: 0.5 mg/dL (ref 0.0–1.2)
Bilirubin, Direct: 0.14 mg/dL (ref 0.00–0.40)
Total Protein: 6.6 g/dL (ref 6.0–8.5)

## 2022-08-17 NOTE — Patient Outreach (Signed)
  Care Coordination   08/17/2022 Name: Randy [name] MRN: [id number] DOB: [birth year]   Care Coordination Outreach Attempts:  An unsuccessful telephone outreach was attempted for a scheduled appointment today.  Follow Up Plan:  Additional outreach attempts will be made to offer the patient care coordination information and services.   Encounter Outcome:  No Answer   Care Coordination Interventions:  No, not indicated    Quinn Plowman [facility] [place] [phone] direct line

## 2022-08-17 NOTE — Patient Instructions (Signed)
Medication Instructions:    Your physician recommends that you continue on your current medications as directed. Please refer to the Current Medication list given to you today.   *If you need a refill on your cardiac medications before your next appointment, please call your pharmacy*   Lab Work:  LFT AND TSH TODAY     If you have labs (blood work) drawn today and your tests are completely normal, you will receive your results only by: [place] (if you have MyChart) OR A paper copy in the mail If you have any lab test that is abnormal or we need to change your treatment, we will call you to review the results.   Testing/Procedures: NONE ORDERED  TODAY    Follow-Up: At [facility], you and your health needs are our priority.  As part of our continuing mission to provide you with exceptional heart care, we have created designated Provider Care Teams.  These Care Teams include your primary Cardiologist (physician) and Advanced Practice Providers (APPs -  Physician Assistants and Nurse Practitioners) who all work together to provide you with the care you need, when you need it.  We recommend signing up for the patient portal called "MyChart".  Sign up information is provided on this After Visit Summary.  MyChart is used to connect with patients for Virtual Visits (Telemedicine).  Patients are able to view lab/test results, encounter notes, upcoming appointments, etc.  Non-urgent messages can be sent to your provider as well.   To learn more about what you can do with MyChart, go to [email or web address].    Your next appointment:   3 -4 month(s)  Provider:   You may see Cristopher Peru, MD or one of the following Advanced Practice Providers on your designated Care Team:   Tommye Standard, [place]    Other Instructions

## 2022-08-20 ENCOUNTER — Other Ambulatory Visit: Payer: Self-pay | Admitting: Nurse Practitioner

## 2022-08-20 ENCOUNTER — Ambulatory Visit (INDEPENDENT_AMBULATORY_CARE_PROVIDER_SITE_OTHER): Payer: Medicare HMO

## 2022-08-20 DIAGNOSIS — I5022 Chronic systolic (congestive) heart failure: Secondary | ICD-10-CM | POA: Diagnosis not present

## 2022-08-20 DIAGNOSIS — Z95810 Presence of automatic (implantable) cardiac defibrillator: Secondary | ICD-10-CM

## 2022-08-20 NOTE — Progress Notes (Signed)
EPIC Encounter for ICM Monitoring  Patient Name: Randy [name] is a 77 y.o. male Date: 08/20/2022 Primary Care Physican: Michela Pitcher, NP Primary Cardiologist: Lovena Le Electrophysiologist: Lovena Le 08/17/2022 Office Weight: 181 lbs   AT/AF Burden: 10% (taking Xarelto)     Transmission reviewed.    Corvue thoracic impedance suggesting normal fluid levels.    Prescribed: Furosemide 20 mg 1 tablet daily   Labs: 06/14/2022 Creatinine 1.68, BUN 23, Potassium 4.6, Sodium 134, GFR 42 03/16/2022 Creatinine 1.71, BUN 24, Potassium 3.7, Sodium 136, GFR 38.45 12/15/2021 Creatinine 1.73, BUN 25, Potassium 4.7, Sodium 135, GFR 37.98 12/01/2021 Creatinine 2.02, BUN 37, Potassium 5.3, Sodium 135, GFR 31.54  11/28/2021 Creatinine 2.39, BUN 32, Potassium 3.8, Sodium 134, GFR 25.78  A complete set of results can be found in Results Review.   Recommendations:  No changes.     Follow-up plan: ICM clinic phone appointment on 09/24/2022.  91 day device clinic remote transmission scheduled for 10/02/2022.    EP/Cardiology Office Visits:  12/21/2022 with Tommye Standard, PA.     Copy of ICM check sent to Dr. Lovena Le.      3 month ICM trend: 08/17/2022.    Rosalene Billings, RN 08/20/2022 4:42 PM

## 2022-08-21 ENCOUNTER — Ambulatory Visit: Payer: Self-pay | Admitting: *Deleted

## 2022-08-21 NOTE — Patient Outreach (Incomplete)
  Care Coordination   Follow Up Visit Note   08/21/2022 Name: Randy [name] MRN: [id number] DOB: 12-15-1945  Randy [name] is a 77 y.o. year old male who sees [name], Alyson Locket, NP for primary care. I spoke with  Randy [name] by phone today.  What matters to the patients health and wellness today?  Memory care placement    Goals Addressed             This Visit's Progress    Medicaid application process       Care Coordination Interventions: Phone call from patient's son to follow up on medicaid application process and long term care options Patient's son states that patient's confusion continues to progress, he is not taking his medication correctly, having difficulty completing his ADL's and IADL's, no longer drives Confirmed contact with the Department of Social Services, long term medicaid process started, son re-submitted application yesterday-case# [id number] case assigned to [place] [phone] to call and follow up regarding status of application and will get Fl2 updated Continued discussion around memory care placement encouraged        SDOH assessments and interventions completed:  No{THN Tip this will not be part of the note when signed-REQUIRED REPORT FIELD DO NOT DELETE (Optional):27901}     Care Coordination Interventions:  Yes, provided {THN Tip this will not be part of the note when signed-REQUIRED REPORT FIELD DO NOT DELETE (Optional):27901}  Follow up plan: Follow up call scheduled for 09/11/22    Encounter Outcome:  Pt. Visit Completed {THN Tip this will not be part of the note when signed-REQUIRED REPORT FIELD DO NOT DELETE (Optional):27901}

## 2022-08-21 NOTE — Patient Instructions (Signed)
Visit Information  Thank you for taking time to visit with me today. Please don't hesitate to contact me if I can be of assistance to you.   Following are the goals we discussed today:   Goals Addressed             This Visit's Progress    Medicaid application process       Care Coordination Interventions: Phone call from patient's son to follow up on medicaid application process and long term care options Patient's son states that patient's confusion continues to progress, he is not taking his medication correctly, having difficulty completing his ADL's and IADL's, no longer drives Confirmed contact with the Department of Social Services, long term medicaid process started, son re-submitted application yesterday-case# [id number] case assigned to [place] [phone] to call and follow up regarding status of application and will get Fl2 updated Continued discussion around memory care placement encouraged        Our next appointment is by telephone on 09/11/22 at 3pm  Please call the care guide team at [phone] if you need to cancel or reschedule your appointment.   If you are experiencing a Mental Health or [place] or need someone to talk to, please call 911   Patient verbalizes understanding of instructions and care plan provided today and agrees to view in [place]. Active MyChart status and patient understanding of how to access instructions and care plan via MyChart confirmed with patient.     Telephone follow up appointment with care management team member scheduled for: 09/11/22  Elliot Gurney, [place] Worker [facility] Care Management [phone]

## 2022-08-22 ENCOUNTER — Telehealth: Payer: Self-pay

## 2022-08-22 NOTE — Patient Outreach (Addendum)
  Care Coordination   Follow Up Visit Note   08/22/2022 Name: JAZEN SPRAGGINS MRN: [id number] DOB: March 15, 1946  [name] is a 77 y.o. year old male who sees [name], Alyson Locket, NP for primary care. I spoke with  [name] by phone on 08/21/22.  What matters to the patients health and wellness today?  Medicaid application process    Goals Addressed             This Visit's Progress    Medicaid application process       Care Coordination Interventions: Phone call from patient's son to follow up on medicaid application process and long term care options Patient's son states that patient's confusion continues to progress, he is not taking his medication correctly, having difficulty completing his ADL's and IADL's, no longer drives Confirmed contact with the Department of Social Services, long term medicaid process started, son re-submitted application yesterday-case# [id number] case assigned to [place] [phone] to call and follow up regarding status of application and will get Fl2 updated Continued discussion around memory care placement encouraged        SDOH assessments and interventions completed:  No     Care Coordination Interventions:  Yes, provided   Follow up plan: Follow up call scheduled for 09/11/22    Encounter Outcome:  Pt. Visit Completed

## 2022-08-22 NOTE — Progress Notes (Cosign Needed)
Care Management & Coordination Services Pharmacy Team  Reason for Encounter: Medication coordination and delivery  Contacted patient to discuss medications and coordinate delivery from [facility]. Spoke with caregiver on 08/22/2022-spoke with son Randy [name] Cycle dispensing form sent to [employer]  for review.   Last adherence delivery date:08/02/22      Patient is due for next adherence delivery on: 08/31/22  Spoke with patient on 08/22/22 reviewed medications and coordinated delivery. Spoke with son Randy [name]  This delivery to include: Adherence Packaging  30 Days  Furosemide 20 mg - 1 daily Allopurinol 100 mg - 1/2 tablet daily  Tamsulosin 0.'4mg'$  - 1 daily  Finasteride 5 mg - 1 daily  Atorvastatin 80 mg - 1 tablet  Glipizide ER 10 mg - 1 daily   Zetia 10 mg- 1 tablet daily Donepezil '5mg'$  -1 tablet  Amiodarone '200mg'$  - 1 tablet Xarelto '15mg'$  - take 1 tablet daily Losartan '25mg'$  -take 1 tablet daily Metoprolol succinate '25mg'$  -take 1 tablet daily  Patient declined the following medications this month: none  Refills requested from providers include: Ezetimibe  Confirmed delivery date of 08/31/22, advised patient that pharmacy will contact them the morning of delivery.   Any concerns about your medications? No  How often do you forget or accidentally miss a dose? Once a week  Do you use a pillbox? No  Is patient in packaging Yes  Any concerns or issues with your packaging? Satisfied and working on compliance with the pill packs    Recent blood pressure readings are as follows: none available   Recent blood glucose readings are as follows: none available    Chart review: Recent office visits:  None since last CCM contact   Recent consult visits:  08/17/22-Renee Ursuy,PA(cardio)-f/u CAD, device interrogation,no medication changes, f/u 4 months  Hospital visits:  None in previous 6 months  Medications: Outpatient Encounter Medications as of 08/22/2022  Medication Sig    Accu-Chek Softclix Lancets lancets USE AS DIRECTED  TO TEST BLOOD SUGAR ONE TIME DAILY  AND AS NEEDED   Alcohol Swabs (B-D SINGLE USE SWABS REGULAR) PADS Use as instructed to clean area for glucose monitoring once daily and as needed.  Diagnosis:  E11.21  Non insulin-dependent   allopurinol (ZYLOPRIM) 100 MG tablet TAKE 1/2 TABLET BY MOUTH EVERY MORNING   amiodarone (PACERONE) 200 MG tablet TAKE ONE TABLET BY MOUTH AT NOON   atorvastatin (LIPITOR) 80 MG tablet TAKE ONE TABLET BY MOUTH AT NOON   carvedilol (COREG) 3.125 MG tablet TAKE ONE TABLET BY MOUTH TWICE DAILY   donepezil (ARICEPT) 5 MG tablet TAKE ONE TABLET BY MOUTH EVERY EVENING   ezetimibe (ZETIA) 10 MG tablet TAKE ONE TABLET BY MOUTH AT NOON   finasteride (PROSCAR) 5 MG tablet TAKE ONE TABLET BY MOUTH EVERY MORNING   furosemide (LASIX) 20 MG tablet TAKE ONE TABLET BY MOUTH EVERY MORNING   gabapentin (NEURONTIN) 300 MG capsule TAKE TWO CAPSULES BY MOUTH TWICE DAILY   glipiZIDE (GLUCOTROL XL) 10 MG 24 hr tablet TAKE ONE TABLET BY MOUTH AT NOON   losartan (COZAAR) 25 MG tablet Take 1 tablet (25 mg total) by mouth daily.   metoprolol succinate (TOPROL XL) 25 MG 24 hr tablet Take 1 tablet (25 mg total) by mouth daily.   Rivaroxaban (XARELTO) 15 MG TABS tablet Take 1 tablet (15 mg total) by mouth daily with supper.   Semaglutide (RYBELSUS) 3 MG TABS Take 3 mg by mouth daily. Via [employer]   tamsulosin (FLOMAX) 0.4  MG CAPS capsule TAKE ONE CAPSULE BY MOUTH EVERY EVENING   No facility-administered encounter medications on file as of 08/22/2022.   BP Readings from Last 3 Encounters:  08/17/22 96/60  06/27/22 (!) 122/56  06/14/22 122/62    Pulse Readings from Last 3 Encounters:  08/17/22 76  06/27/22 (!) 53  06/14/22 73    Lab Results  Component Value Date/Time   HGBA1C 13.6 (H) 06/27/2022 12:35 PM   HGBA1C 9.1 (A) 03/16/2022 11:12 AM   HGBA1C 10.0 (A) 11/28/2021 12:31 PM   HGBA1C 8.9 (H) 03/10/2021 10:25 AM   HGBA1C 6.4  06/16/2018 09:19 AM   Lab Results  Component Value Date   CREATININE 1.83 (H) 06/27/2022   BUN 23 06/27/2022   GFR 35.37 (L) 06/27/2022   GFRNONAA 23 (L) 06/10/2019   GFRAA 26 (L) 06/10/2019   NA 132 (L) 06/27/2022   K 4.1 06/27/2022   CALCIUM 9.2 06/27/2022   CO2 28 06/27/2022     Charlene Brooke, PharmD notified  [name], [place] Assistant [phone]

## 2022-08-23 ENCOUNTER — Telehealth: Payer: Self-pay | Admitting: *Deleted

## 2022-08-23 NOTE — Patient Outreach (Signed)
Phone call to the [facility] [phone] to request information related to status of patient's long term care application. VM left requesting a return call.   Elliot Gurney, [place] Worker  [facility] Care Management [phone]

## 2022-08-24 ENCOUNTER — Telehealth: Payer: Self-pay

## 2022-08-27 NOTE — Patient Outreach (Signed)
  Care Coordination   Follow Up Visit Note   08/27/2022 Late entry for 08/24/22 Name: Randy [name] MRN: [id number] DOB: [birth year]  Randy [name] is a 77 y.o. year old male who sees Randy, Alyson Locket, NP for primary care. I  spoke with patients son, Randy [name]  What matters to the patients health and wellness today?  Ongoing care coordination support    Goals Addressed             This Visit's Progress    Management/ education for health conditions       Care Coordination Interventions: Evaluation of current treatment plan related to Diabetes/ heart failure and patient's adherence to plan as established by provider:  Son states no changes in patient at this time. Son states patients condition is the same. He states he's had conversation with the social worker and is attempting to apply for longterm Medicaid for patient. Denies patient having any recent falls.   Reviewed medications with patient and discussed importance of compliance Reviewed scheduled/upcoming provider appointments:  Per chart review patients next follow up visit with primary care provider is 2/ 29/24 Discussed plans with patient for ongoing care management follow up and provided patient with direct contact information for care management team:  Son verbally agreed to next telephone outreach call from [facility] on 09/07/22 Active listening/ emotional support.           SDOH assessments and interventions completed:  No     Care Coordination Interventions:  Yes, provided   Follow up plan: Follow up call scheduled for 09/07/22    Encounter Outcome:  Pt. Visit Completed   Randy Plowman RN,BSN,CCM [place] [phone] direct line

## 2022-08-30 ENCOUNTER — Telehealth: Payer: Self-pay

## 2022-08-30 NOTE — Telephone Encounter (Signed)
Alert received from [employer]:  Device alert for VT/VF with successful HV therapy Event occurred 1/28 @ 16:39, EGM shows AF, change in discriminator change,  periods of regularity and irregularity, 30J of HV therapy delivered converting to SR AF burden 4.7%, Amiodarone, Xarelto  Outreach made to [name].  He states that Pt remembers to take his medications about half the time.  His son sees him 3-4 days a week.  States these are the days he will give his father his medication.   His pills are pre packaged and not taken on the other days.  The son says he has signs up to assist Pt.  Son states Pt did remember the shock.  States that is usually all he remembers.  Son states he will try again to assist Pt to remember medications.   Discussed with Dr. Lovena Le.  Per Dr. Lovena Le no action needed at this time. Pt has follow up scheduled in May with EP APP.

## 2022-08-30 NOTE — Telephone Encounter (Cosign Needed)
Contacted the patients son,[name] and informed they would be notified when Rybelsus arrived to office. Patient does have samples of xarelto to use due to cost being high this month.    [name], [place] Assistant [phone]

## 2022-08-30 NOTE — Telephone Encounter (Signed)
Received notice from [employer] that [place] has been approved for 2024. Shipment expected to office in 10-14 business days.

## 2022-09-06 ENCOUNTER — Telehealth: Payer: Self-pay

## 2022-09-06 NOTE — Progress Notes (Signed)
Care Management & Coordination Services Pharmacy Team  Reason for Encounter: Appointment Reminder  Contacted patient to confirm telephone appointment with Charlene Brooke , PharmD on 09/11/22 at 3:00. Spoke with family on 09/06/2022   Call son Laverna Peace [phone]  Do you have any problems getting your medications? No   What is your top health concern you would like to discuss at your upcoming visit?  No concerns at this time  Have you seen any other providers since your last visit with PCP? Yes-cardiology    Hospital visits:  None in previous 6 months   Star Rating Drugs:  Medication:  Last Fill: Day Supply Atorvastatin '80mg'$  08/29/22 30 Glipizide '10mg'$  08/29/22 30 Losartan '25mg'$  08/29/22 30 Rybelsus '3mg'$    manufacturer   Care Gaps: Annual wellness visit in last year? No  If Diabetic: Last eye exam / retinopathy screening:2021 Last diabetic foot exam:UTD   Charlene Brooke, PharmD notified  [name], [place] Assistant [phone]

## 2022-09-07 ENCOUNTER — Ambulatory Visit: Payer: Self-pay

## 2022-09-07 NOTE — Patient Outreach (Signed)
  Care Coordination   Follow Up Visit Note   09/07/2022 Name: Randy [name] MRN: [id number] DOB: 01-24-1946  Randy [name] is a 77 y.o. year old male who sees [name], Alyson Locket, NP for primary care. I  spoke with Randy [name] son/ caregiver.   What matters to the patients health and wellness today?  Need for long term memory care    Goals Addressed             This Visit's Progress    Management/ education for health conditions       Interventions Today    Flowsheet Row Most Recent Value  Chronic Disease Discussed/Reviewed   Chronic disease discussed/reviewed during today's visit Diabetes, Congestive Heart Failure (CHF)  General Interventions   General Interventions Discussed/Reviewed General Interventions Reviewed  Pharmacy Interventions   Pharmacy Dicussed/Reviewed Pharmacy Topics Reviewed, Medication Adherence  Medication Adherence Not taking medication  [Per son patient taking medication on 50% of the time.]      Care Coordination Interventions: Evaluation of current treatment plan related to Diabetes/ heart failure and patient's adherence to plan as established by provider:  Son states no changes in patient status.  No new medical concerns or symptoms.  Son states patient continues to take his medications only 50% of the time because he forgets.  Son states patients medications are in pill pack form provided by [facility].  Son states still waiting on long term care Medicaid  process.  Reviewed medications with patient and discussed importance of compliance Reviewed scheduled/upcoming provider appointments:   Discussed plans with patient for ongoing care management follow up and provided patient with direct contact information for care management team:  Son verbally agreed to next telephone outreach call from [facility] on 09/07/22 Active listening/ emotional support.           SDOH assessments and interventions completed:  No     Care Coordination Interventions:  Yes,  provided   Follow up plan: Follow up call scheduled for 11/07/22    Encounter Outcome:  Pt. Visit Completed   Quinn Plowman RN,BSN,CCM [place] [phone] direct line

## 2022-09-10 ENCOUNTER — Telehealth: Payer: Self-pay

## 2022-09-10 NOTE — Telephone Encounter (Signed)
Called and informed pt that medication is here and will be in front office for pick up.

## 2022-09-10 NOTE — Telephone Encounter (Signed)
Received 30 days 3 MG and 30 days 7 MG of Rybelsus. Medication labeled and place in front office for pickup. Patient will be notified.

## 2022-09-11 ENCOUNTER — Ambulatory Visit: Payer: Medicare HMO | Admitting: Pharmacist

## 2022-09-11 ENCOUNTER — Ambulatory Visit: Payer: Self-pay | Admitting: *Deleted

## 2022-09-11 NOTE — Patient Instructions (Signed)
Visit Information  Phone number for Pharmacist: [phone]  Thank you for meeting with me to discuss your medications! Below is a summary of what we talked about during the visit:   Recommendations/Changes made from today's visit: -No med changes; encouraged continued family support to improve medication compliance  Follow up plan: -Health Concierge will call patient monthly for medication delivery coordination -Pharmacist follow up televisit scheduled for 3 months -PCP appt 09/27/22   Charlene Brooke, PharmD, BCACP Clinical Pharmacist [facility] [phone]

## 2022-09-11 NOTE — Progress Notes (Signed)
Care Management & Coordination Services Pharmacy Note  09/11/2022 Name:  Randy [name] MRN:  [id number] DOB:  [birth year]  Summary: F/U visit -Per son, pt continues to struggle with medication non-compliance. Medications were recently consolidated into once-daily options so he can take all meds at once, but pt is still missing doses 3-4 days a week when son is no there to remind him -DM: A1c 13.6% (05/2022) in setting of noncompliance; pt has not started Rybelsus yet, it is not clear how effective this will be as compliance will continue to be an issue; pt/family have stated once weekly injections are not feasible either  Recommendations/Changes made from today's visit: -No med changes; encouraged continued family support to improve medication compliance  Follow up plan: -[facility] will call patient monthly for medication delivery coordination -Pharmacist follow up televisit scheduled for 3 months -PCP appt 09/27/22    Subjective: Randy [name] is an 77 y.o. year old male who is a primary patient of Cavon, [place], NP.  The care coordination team was consulted for assistance with disease management and care coordination needs.    Engaged with patient by telephone for follow up visit.  Recent office visits: 06/27/22 NP Matt [name] OV: annual - glucose 518. A1c 13.6%. LDL 155, TRIG 770. Start Trulicity (cost too high, changed to Rybelsus PAP)  Recent consult visits: 08/17/22 PA Tommye Standard (Cardiology): f/u - BP 96/60. No changes. 06/14/22 PA Tommye Standard (Cardiology): f/u - change Eliquis to Xarelto. Change Entresto to losartan. Change Carvedilol to metoprolol succinate (once daily versions)  Hospital visits: None in previous 6 months   Objective:  Lab Results  Component Value Date   CREATININE 1.83 (H) 06/27/2022   BUN 23 06/27/2022   GFR 35.37 (L) 06/27/2022   EGFR 42 (L) 06/14/2022   GFRNONAA 23 (L) 06/10/2019   GFRAA 26 (L) 06/10/2019   NA 132 (L) 06/27/2022   K  4.1 06/27/2022   CALCIUM 9.2 06/27/2022   CO2 28 06/27/2022   GLUCOSE 518 (HH) 06/27/2022    Lab Results  Component Value Date/Time   HGBA1C 13.6 (H) 06/27/2022 12:35 PM   HGBA1C 9.1 (A) 03/16/2022 11:12 AM   HGBA1C 10.0 (A) 11/28/2021 12:31 PM   HGBA1C 8.9 (H) 03/10/2021 10:25 AM   HGBA1C 6.4 06/16/2018 09:19 AM   GFR 35.37 (L) 06/27/2022 12:35 PM   GFR 38.45 (L) 03/16/2022 11:38 AM   MICROALBUR 21.7 (H) 12/15/2021 10:51 AM   MICROALBUR 2.0 (H) 08/05/2019 09:36 AM    Last diabetic Eye exam:  Lab Results  Component Value Date/Time   HMDIABEYEEXA No Retinopathy 07/14/2019 12:00 AM    Last diabetic Foot exam: No results found for: "HMDIABFOOTEX"   Lab Results  Component Value Date   CHOL 290 (H) 06/27/2022   HDL 36.30 (L) 06/27/2022   LDLCALC 109 (H) 06/29/2021   LDLDIRECT 155.0 06/27/2022   TRIG (H) 06/27/2022    770.0 Triglyceride is over 400; calculations on Lipids are invalid.   CHOLHDL 8 06/27/2022       Latest Ref Rng & Units 08/17/2022    9:01 AM 06/27/2022   12:35 PM 06/14/2022    9:20 AM  Hepatic Function  Total Protein 6.0 - 8.5 g/dL 6.6  7.0  6.7   Albumin 3.8 - 4.8 g/dL 4.4  4.3  4.3   AST 0 - 40 IU/L 11  10  11   $ ALT 0 - 44 IU/L 6  8  9   $ Alk Phosphatase 44 -  121 IU/L 79  86  92   Total Bilirubin 0.0 - 1.2 mg/dL 0.5  0.5  0.5   Bilirubin, Direct 0.00 - 0.40 mg/dL 0.14       Lab Results  Component Value Date/Time   TSH 1.300 08/17/2022 09:01 AM   TSH 1.17 06/27/2022 12:35 PM       Latest Ref Rng & Units 06/27/2022   12:35 PM 06/14/2022    9:20 AM 03/16/2022   11:38 AM  CBC  WBC 4.0 - 10.5 K/uL 7.4  7.7  6.7   Hemoglobin 13.0 - 17.0 g/dL 12.9  12.2  12.1   Hematocrit 39.0 - 52.0 % 38.2  38.5  36.7   Platelets 150.0 - 400.0 K/uL 197.0  187  167.0     Lab Results  Component Value Date/Time   VD25OH 19.27 (L) 11/28/2021 01:04 PM   VD25OH 31.61 03/10/2021 10:25 AM   VITAMINB12 199 (L) 03/10/2021 10:25 AM    Clinical ASCVD: Yes  The ASCVD  Risk score (Arnett DK, et al., 2019) failed to calculate for the following reasons:   The patient has a prior MI or stroke diagnosis    CHA2DS2/VAS Stroke Risk Points  Current as of 58 minutes ago     6 >= 2 Points: High Risk  1 - 1.99 Points: Medium Risk  0 Points: Low Risk    Last Change:      Points Metrics  1 Has Congestive Heart Failure:  Yes    Current as of 58 minutes ago  1 Has Vascular Disease:  Yes    Current as of 58 minutes ago  1 Has Hypertension:  Yes    Current as of 58 minutes ago  2 Age:  88    Current as of 58 minutes ago  1 Has Diabetes:  Yes    Current as of 58 minutes ago  0 Had Stroke:  No  Had TIA:  No  Had Thromboembolism:  No    Current as of 58 minutes ago  0 Male:  No    Current as of 58 minutes ago        06/28/2022    4:44 PM 12/13/2021    3:03 PM 03/10/2021    8:08 AM  Depression screen PHQ 2/9  Decreased Interest 0 0 0  Down, Depressed, Hopeless 0 0 0  PHQ - 2 Score 0 0 0     Social History   Tobacco Use  Smoking Status Former   Packs/day: 2.00   Years: 17.00   Total pack years: 34.00   Types: Cigarettes   Quit date: 07/30/1984   Years since quitting: 38.1  Smokeless Tobacco Never   BP Readings from Last 3 Encounters:  08/17/22 96/60  06/27/22 (!) 122/56  06/14/22 122/62   Pulse Readings from Last 3 Encounters:  08/17/22 76  06/27/22 (!) 53  06/14/22 73   Wt Readings from Last 3 Encounters:  08/17/22 181 lb 12.8 oz (82.5 kg)  06/27/22 185 lb 4 oz (84 kg)  06/14/22 186 lb 3.2 oz (84.5 kg)   BMI Readings from Last 3 Encounters:  08/17/22 26.85 kg/m  06/27/22 27.36 kg/m  06/14/22 27.50 kg/m    No Known Allergies  Medications Reviewed Today     Reviewed by Charlton Haws, RPH (Pharmacist) on 09/11/22 at 1549  Med List Status: <None>   Medication Order Taking? Sig Documenting Provider Last Dose Status Informant  Accu-Chek Softclix Lancets lancets [id number] Yes USE AS  DIRECTED  TO TEST BLOOD SUGAR ONE TIME  DAILY  AND AS NEEDED Elby Beck, FNP Taking Active   Alcohol Swabs (B-D SINGLE USE SWABS REGULAR) PADS [id number] Yes Use as instructed to clean area for glucose monitoring once daily and as needed.  Diagnosis:  E11.21  Non insulin-dependent Tonia Ghent, MD Taking Active Self  allopurinol (ZYLOPRIM) 100 MG tablet [id number] Yes TAKE 1/2 TABLET BY MOUTH EVERY MORNING Michela Pitcher, NP Taking Active   amiodarone (PACERONE) 200 MG tablet [id number] Yes TAKE ONE TABLET BY MOUTH AT Oletha Blend, PA-C Taking Active   atorvastatin (LIPITOR) 80 MG tablet [id number] Yes TAKE ONE TABLET BY MOUTH AT Windell Hummingbird, MD Taking Active   carvedilol (COREG) 3.125 MG tablet [id number] Yes TAKE ONE TABLET BY MOUTH TWICE DAILY Evans Lance, MD Taking Active   donepezil (ARICEPT) 5 MG tablet [id number] Yes TAKE ONE TABLET BY MOUTH EVERY Elgie Congo, NP Taking Active   ezetimibe (ZETIA) 10 MG tablet [id number] Yes TAKE ONE TABLET BY MOUTH AT Sharion Balloon, NP Taking Active   finasteride (PROSCAR) 5 MG tablet [id number] Yes TAKE ONE TABLET BY MOUTH EVERY MORNING Michela Pitcher, NP Taking Active   furosemide (LASIX) 20 MG tablet [id number] Yes TAKE ONE TABLET BY MOUTH EVERY MORNING Michela Pitcher, NP Taking Active   gabapentin (NEURONTIN) 300 MG capsule [id number] Yes TAKE TWO CAPSULES BY MOUTH TWICE DAILY Michela Pitcher, NP Taking Active   glipiZIDE (GLUCOTROL XL) 10 MG 24 hr tablet [id number] Yes TAKE ONE TABLET BY MOUTH AT Sharion Balloon, NP Taking Active   losartan (COZAAR) 25 MG tablet [id number] Yes Take 1 tablet (25 mg total) by mouth daily. Baldwin Jamaica, PA-C Taking Active   metoprolol succinate (TOPROL XL) 25 MG 24 hr tablet [id number] Yes Take 1 tablet (25 mg total) by mouth daily. Baldwin Jamaica, PA-C Taking Active   Rivaroxaban (XARELTO) 15 MG TABS tablet [id number] Yes Take 1 tablet (15 mg total) by mouth daily with supper. Baldwin Jamaica, PA-C Taking  Active   Semaglutide (RYBELSUS) 3 MG TABS [id number] No Take 3 mg by mouth daily. [place]  Patient not taking: Reported on 09/11/2022   Michela Pitcher, NP Not Taking Active            Med Note Angelica Ran Sep 11, 2022  3:49 PM) Received in office 09/10/22  tamsulosin (FLOMAX) 0.4 MG CAPS capsule [id number] Yes TAKE ONE CAPSULE BY MOUTH EVERY EVENING Michela Pitcher, NP Taking Active             SDOH:  (Social Determinants of Health) assessments and interventions performed: No SDOH Interventions    Flowsheet Row Care Coordination from 07/20/2022 in [place] Coordination from 06/28/2022 in [place] [facility] from 10/17/2021 in [place] at [place] Management from 07/14/2021 in [place] [facility] [place] [facility] from 05/24/2021 in [place] [facility] [place] from 04/22/2017 in [place] at [place] Interventions Intervention Not Indicated Intervention Not Indicated Intervention Not Indicated -- -- --  Housing Interventions Intervention Not Indicated Intervention Not Indicated -- Intervention Not Indicated -- --  Transportation Interventions Intervention Not Indicated Intervention Not Indicated -- --  Intervention Not Indicated --  Depression Interventions/Treatment  -- -- -- -- -- --  [referral to PCP ]  Financial Strain Interventions -- -- Intervention Not Indicated -- -- --  Physical Activity Interventions -- Intervention Not Indicated -- -- -- --       Medication Assistance:  [place] PAP 2024 approved  Medication Access: Within the past 30 days, how often has patient missed a dose of medication? 3-4 days per week Is a pillbox or other method used to improve adherence? Yes   Factors that may affect medication adherence?  Memory impairment Are meds synced by current pharmacy? Yes  Are meds delivered by current pharmacy? Yes  Does patient experience delays in picking up medications due to transportation concerns? No   [facility] Reviewed: Is patient disadvantaged to use [facility]?: No  Current Rx insurance plan: [employer] Name and location of Current pharmacy:  [facility] - [place], [place] - [address] [place] [address] Phone: [phone] Fax: [phone]  [facility] services reviewed with patient today?: Yes  30-day packs (delivery 08/31/22): Furosemide 20 mg - 1 daily Allopurinol 100 mg - 1/2 tablet daily  Tamsulosin 0.44m - 1 daily  Finasteride 5 mg - 1 daily  Atorvastatin 80 mg - 1 tablet  Glipizide ER 10 mg - 1 daily   Zetia 10 mg- 1 tablet daily Donepezil 596m-1 tablet  Amiodarone 20020m 1 tablet Losartan 54m54make 1 tablet daily Metoprolol succinate 54mg43mke 1 tablet daily  Xarelto - samples from cardiology   Compliance/Adherence/Medication fill history: Care Gaps: AWV  Eye exam (due 06/2020)  Star-Rating Drugs: Atorvastatin - PDC 82% Glipizide - PDC 82% Losartan - PDC 80% Rybelsus - PAP   ASSESSMENT / PLAN   Hypertension / NSVT (BP goal <140/90) -Controlled - BP low-normal in clinic -Last ejection fraction: 25-30% (Date: 07/2017) -HF type: Systolic; NYHA Class: II-III -Current treatment: Metoprolol succinate 25 mg daily - Appropriate, Effective, Safe, Accessible Furosemide 20 mg daily - Appropriate, Effective, Safe, Accessible Losartan 25 mg daily - Appropriate, Effective, Safe, Accessible -Medications previously tried: carvedilol, Entresto -Denies hypotensive/hypertensive symptoms -Educated on BP goals and benefits of medications for prevention of heart attack, stroke and kidney damage; -Counseled to monitor BP at home  periodically -Recommend to continue current medication  Hyperlipidemia: (LDL goal < 70) -Uncontrolled - LDL 123 (11/2021) above goal and worsened since 06/2021, likely due to adherence issues -  compliance with all medications is an issue due to dementia -Hx NSTEMI -Current treatment: Atorvastatin 80 mg daily - Appropriate, Query Effective Ezetimibe 10 mg daily -Appropriate, Query Effective -Medications previously tried: n/a  -Educated on Cholesterol goals; Benefits of statin for ASCVD risk reduction; -Recommended to continue current medication; encouraged compliance with pill packs  Atrial Fibrillation (Goal: prevent stroke and major bleeding) -Query controlled - med compliance is an issue; concern that patient forgets bedtime doses so is missing 2nd dose of Eliquis often, would consider switching to Xarelto for once daily administration -CHADSVASC: 6 (age2, male CHF, HTN, CAD, DM) -Current treatment: Amiodarone 200 mg daily - Appropriate, Effective, Safe, Accessible Metoprolol succinate 25 mg daily - Appropriate, Effective, Safe, Accessible Xarelto 15 mg daily - Appropriate, Effective, Safe, Accessible -Medications previously tried: n/a -Counseled on increased risk of stroke due to Afib and benefits of anticoagulation for stroke prevention; -Recommend to continue current medication;   Diabetes (A1c goal <8%) -Uncontrolled- A1c 13.6% (05/2022) much worse in s/o  medication noncompliance; Rybelsus PAP was delivered to office this week, family will pick it up  -Current medications: Glipizide XL 10 mg daily AM - Appropriate, Effective, Safe, Accessible Rybelsus 3 mg daily (PAP) - not started yet -Medications previously tried: metformin  -Educated on A1c and blood sugar goals; -Counseled to check feet daily and get yearly eye exams -Reviewed options to improve compliance - ideally weekly GLP-1 RA injection given by family member, but son declined this -Recommended to continue current  medication;  Cognitive impairment (Goal: slow progression) -Query controlled - memory issues are impacting health conditions (DM is worse, blood thinner likely ineffective, etc) -Current treatment  Donepezil 5 mg daily HS - Appropriate, Effective, Safe, Accessible -Medications previously tried: n/a  -Recommended to continue current medication  Health Maintenance -Overall questionable medication adherence even with pill packs due to memory issues; per son, patient often forgets to take the packs even though the box is placed on his side table near his chair in living room -Encouraged son to monitor medication adherence as able    Charlene Brooke, PharmD, BCACP Clinical Pharmacist [facility] at [facility] [phone]

## 2022-09-12 ENCOUNTER — Telehealth: Payer: Self-pay | Admitting: *Deleted

## 2022-09-12 ENCOUNTER — Encounter: Payer: Self-pay | Admitting: *Deleted

## 2022-09-12 NOTE — Patient Instructions (Signed)
Visit Information  Thank you for taking time to visit with me today. Please don't hesitate to contact me if I can be of assistance to you.   Following are the goals we discussed today:   Goals Addressed             This Visit's Progress    Medicaid application process       Care Coordination Interventions: Patient's son to follow up on medicaid application process and long term care options CSW contacted Department of Social Services-case# [phone] case assigned to [place] [phone] to call and follow up regarding status of application and will get Fl2 updated-left additional message for return call Continued discussion around memory care placement encouraged        Our next appointment is by telephone on 09/12/22 at 1pm  Please call the care guide team at [phone] if you need to cancel or reschedule your appointment.   If you are experiencing a Mental Health or [place] or need someone to talk to, please call 911   The patient verbalized understanding of instructions, educational materials, and care plan provided today and DECLINED offer to receive copy of patient instructions, educational materials, and care plan.   Telephone follow up appointment with care management team member scheduled for: 09/12/22  Elliot Gurney, [place] Worker  [facility] Care Management [phone]

## 2022-09-12 NOTE — Patient Outreach (Signed)
  Care Coordination   Follow Up Visit Note   09/12/2022 Name: Randy [name] MRN: [id number] DOB: 1945-12-07 Late Entry Randy [name] is a 77 y.o. year old male who sees [name], Alyson Locket, NP for primary care. I spoke with  Randy [name] son by phone on 09/11/22.  What matters to the patients health and wellness today?  Memory care placement    Goals Addressed             This Visit's Progress    Medicaid application process       Care Coordination Interventions: Patient's son to follow up on medicaid application process and long term care options CSW contacted Department of Social Services-case# [phone] case assigned to [place] [phone] to call and follow up regarding status of application and will get Fl2 updated-left additional message for return call Continued discussion around memory care placement encouraged        SDOH assessments and interventions completed:  No     Care Coordination Interventions:  Yes, provided  Interventions Today    Flowsheet Row Most Recent Value  Chronic Disease   Chronic disease during today's visit Diabetes, Congestive Heart Failure (CHF)  General Interventions   General Interventions Discussed/Reviewed General Interventions Reviewed, Level of Care  Level of Care Applications  Applications Medicaid  [special assistance Medicaid]  Education Interventions   Applications Medicaid  [special assistance Medicaid]       Follow up plan: Follow up call scheduled for 09/13/22    Encounter Outcome:  Pt. Visit Completed

## 2022-09-13 NOTE — Patient Outreach (Signed)
  Care Coordination   Follow Up Visit Note   09/13/2022 Name: Randy [name] MRN: [id number] DOB: 07/31/1945 Late Entry Randy [name] is a 77 y.o. year old male who sees [name], Alyson Locket, NP for primary care. I spoke with  Randy [name] by phone today.  What matters to the patients health and wellness today?  Memory Care placement    Goals Addressed             This Visit's Progress    Medicaid application process       Care Coordination Interventions: Phone call to the Department of Social Services-confirmed that patient's medicaid application was denied as of 08/28/22 It was recommended that patient's son come into the office(Department of Social Services)  to apply for special assistance medicaid in person as information often get confused when applying online Patient's son contacted and is agreeable to present in person to follow up on status of patient's application continued discussion around memory care placement encouraged        SDOH assessments and interventions completed:  No     Care Coordination Interventions:  Yes, provided  Interventions Today    Flowsheet Row Most Recent Value  General Interventions   General Interventions Discussed/Reviewed Level of Care  Level of Care Applications  [DSS contact regarding status of Medicaid application]        Follow up plan: Follow up call scheduled for 09/27/22    Encounter Outcome:  Pt. Visit Completed

## 2022-09-13 NOTE — Telephone Encounter (Signed)
This encounter was created in error - please disregard.

## 2022-09-19 ENCOUNTER — Other Ambulatory Visit: Payer: Self-pay | Admitting: Nurse Practitioner

## 2022-09-19 DIAGNOSIS — E790 Hyperuricemia without signs of inflammatory arthritis and tophaceous disease: Secondary | ICD-10-CM

## 2022-09-20 ENCOUNTER — Telehealth: Payer: Self-pay

## 2022-09-20 NOTE — Progress Notes (Signed)
Care Management & Coordination Services Pharmacy Team  Reason for Encounter: Medication coordination and delivery  Contacted patient to discuss medications and coordinate [facility] from [facility]. Spoke with caregiver on 09/24/2022  patient son Laverna Peace Cycle dispensing form sent to Margaretmary Dys  for review.   Last adherence delivery date:08/31/22      Patient is due for next adherence delivery on: 10/02/22  This delivery to include: Adherence Packaging  30 Days  Furosemide 20 mg - 1 daily Allopurinol 100 mg - 1/2 tablet daily  Tamsulosin 0.'4mg'$  - 1 daily  Finasteride 5 mg - 1 daily  Atorvastatin 80 mg - 1 tablet  Glipizide ER 10 mg - 1 daily   Zetia 10 mg- 1 tablet daily Donepezil '5mg'$  -1 tablet  Amiodarone '200mg'$  - 1 tablet Xarelto '15mg'$  - take 1 tablet daily Losartan '25mg'$  -take 1 tablet daily Metoprolol succinate '25mg'$  -take 1 tablet daily  Patient declined the following medications this month: none  Refills requested from providers include:allopurinol, furosemide   Confirmed delivery date of 10/02/22, advised patient that pharmacy will contact them the morning of delivery.   Any concerns about your medications? No  How often do you forget or accidentally miss a dose? Once a week  Do you use a pillbox? No  Is patient in packaging Yes  What is the date on your next pill pack? Not at home to read date  Any concerns or issues with your packaging? satisfied   Recent blood pressure readings are as follows:none available   Recent blood glucose readings are as follows: none available    Chart review: Recent office visits:  None since last contact   Recent consult visits:  None since last contact   Hospital visits:  None in previous 6 months  Medications: Outpatient Encounter Medications as of 09/20/2022  Medication Sig Note   Accu-Chek Softclix Lancets lancets USE AS DIRECTED  TO TEST BLOOD SUGAR ONE TIME DAILY  AND AS NEEDED    Alcohol Swabs (B-D SINGLE USE  SWABS REGULAR) PADS Use as instructed to clean area for glucose monitoring once daily and as needed.  Diagnosis:  E11.21  Non insulin-dependent    allopurinol (ZYLOPRIM) 100 MG tablet TAKE 1/2 TABLET BY MOUTH AT NOON    amiodarone (PACERONE) 200 MG tablet TAKE ONE TABLET BY MOUTH AT NOON    atorvastatin (LIPITOR) 80 MG tablet TAKE ONE TABLET BY MOUTH AT NOON    carvedilol (COREG) 3.125 MG tablet TAKE ONE TABLET BY MOUTH TWICE DAILY    donepezil (ARICEPT) 5 MG tablet TAKE ONE TABLET BY MOUTH EVERY EVENING    ezetimibe (ZETIA) 10 MG tablet TAKE ONE TABLET BY MOUTH AT NOON    finasteride (PROSCAR) 5 MG tablet TAKE ONE TABLET BY MOUTH EVERY MORNING    furosemide (LASIX) 20 MG tablet TAKE ONE TABLET BY MOUTH AT NOON    gabapentin (NEURONTIN) 300 MG capsule TAKE TWO CAPSULES BY MOUTH TWICE DAILY    glipiZIDE (GLUCOTROL XL) 10 MG 24 hr tablet TAKE ONE TABLET BY MOUTH AT NOON    losartan (COZAAR) 25 MG tablet Take 1 tablet (25 mg total) by mouth daily.    metoprolol succinate (TOPROL XL) 25 MG 24 hr tablet Take 1 tablet (25 mg total) by mouth daily.    Rivaroxaban (XARELTO) 15 MG TABS tablet Take 1 tablet (15 mg total) by mouth daily with supper.    Semaglutide (RYBELSUS) 3 MG TABS Take 3 mg by mouth daily. [employer] (Patient not  taking: Reported on 09/11/2022) 09/11/2022: Received in office 09/10/22   tamsulosin (FLOMAX) 0.4 MG CAPS capsule TAKE ONE CAPSULE BY MOUTH EVERY EVENING    No facility-administered encounter medications on file as of 09/20/2022.   BP Readings from Last 3 Encounters:  08/17/22 96/60  06/27/22 (!) 122/56  06/14/22 122/62    Pulse Readings from Last 3 Encounters:  08/17/22 76  06/27/22 (!) 53  06/14/22 73    Lab Results  Component Value Date/Time   HGBA1C 13.6 (H) 06/27/2022 12:35 PM   HGBA1C 9.1 (A) 03/16/2022 11:12 AM   HGBA1C 10.0 (A) 11/28/2021 12:31 PM   HGBA1C 8.9 (H) 03/10/2021 10:25 AM   HGBA1C 6.4 06/16/2018 09:19 AM   Lab Results  Component Value Date    CREATININE 1.83 (H) 06/27/2022   BUN 23 06/27/2022   GFR 35.37 (L) 06/27/2022   GFRNONAA 23 (L) 06/10/2019   GFRAA 26 (L) 06/10/2019   NA 132 (L) 06/27/2022   K 4.1 06/27/2022   CALCIUM 9.2 06/27/2022   CO2 28 06/27/2022     Charlene Brooke, PharmD notified  [name], [place] Assistant [phone]

## 2022-09-24 ENCOUNTER — Ambulatory Visit: Payer: Medicare HMO | Attending: Internal Medicine

## 2022-09-24 DIAGNOSIS — Z95810 Presence of automatic (implantable) cardiac defibrillator: Secondary | ICD-10-CM | POA: Diagnosis not present

## 2022-09-24 DIAGNOSIS — I5022 Chronic systolic (congestive) heart failure: Secondary | ICD-10-CM | POA: Diagnosis not present

## 2022-09-24 NOTE — Progress Notes (Signed)
EPIC Encounter for ICM Monitoring  Patient Name: Randy [name] is a 77 y.o. male Date: 09/24/2022 Primary Care Physican: Michela Pitcher, NP Primary Cardiologist: Lovena Le Electrophysiologist: Lovena Le 08/17/2022 Office Weight: 181 lbs   AT/AF Burden: 4.7% (2/1 transmission) (taking Xarelto)     Transmission reviewed.    [place] thoracic impedance suggesting normal fluid levels.    Prescribed: Furosemide 20 mg 1 tablet daily   Labs: 06/27/2022 Creatinine 1.83, BUN 23, Potassium 4.1, Sodium 132, GFR 35.37 06/14/2022 Creatinine 1.68, BUN 23, Potassium 4.6, Sodium 134, GFR 42 03/16/2022 Creatinine 1.71, BUN 24, Potassium 3.7, Sodium 136, GFR 38.45 12/15/2021 Creatinine 1.73, BUN 25, Potassium 4.7, Sodium 135, GFR 37.98 12/01/2021 Creatinine 2.02, BUN 37, Potassium 5.3, Sodium 135, GFR 31.54  11/28/2021 Creatinine 2.39, BUN 32, Potassium 3.8, Sodium 134, GFR 25.78  A complete set of results can be found in Results Review.   Recommendations:  No changes.     Follow-up plan: ICM clinic phone appointment on 10/29/2022.  91 day device clinic remote transmission scheduled for 10/02/2022.    EP/Cardiology Office Visits:  12/21/2022 with Tommye Standard, PA.     Copy of ICM check sent to Dr. Lovena Le.  3 month ICM trend: 09/23/2022.    Rosalene Billings, RN 09/24/2022 8:05 AM

## 2022-09-26 ENCOUNTER — Ambulatory Visit: Payer: Self-pay | Admitting: *Deleted

## 2022-09-26 NOTE — Patient Outreach (Signed)
  Care Coordination   Follow Up Visit Note   09/26/2022 Name: QUAN FORNEY MRN: [id number] DOB: [birth year]  [name] is a 77 y.o. year old male who sees [name], Alyson Locket, NP for primary care. I spoke with  [name] 's son by phone today.  What matters to the patients health and wellness today?  Memory Care placement    Goals Addressed             This Visit's Progress    Medicaid application process       Care Coordination Interventions: Previous phone call to the Department of Social Services-confirmed that patient's medicaid application was denied as of 08/28/22 It was recommended that patient's son come into the office(Department of Social Services)  to apply for special assistance medicaid in person as information often get confused when applying online Patient's son contacted and is agreeable to present in person however is out of town and will follow up upon his return Son confirms current care plan: patient's son will call daily,  patient's neighbors available to check in on patient check as well, pill box filled before he left.  This Education officer, museum will follow up with patient's son within the next 43 business days        SDOH assessments and interventions completed:  No     Care Coordination Interventions:  Yes, provided   Follow up plan: Follow up call scheduled for 10/24/22    Encounter Outcome:  Pt. Visit Completed

## 2022-09-26 NOTE — Patient Instructions (Signed)
Visit Information  Thank you for taking time to visit with me today. Please don't hesitate to contact me if I can be of assistance to you.   Following are the goals we discussed today:   Goals Addressed             This Visit's Progress    Medicaid application process       Care Coordination Interventions: Previous phone call to the Department of Social Services-confirmed that patient's medicaid application was denied as of 08/28/22 It was recommended that patient's son come into the office(Department of Social Services)  to apply for special assistance medicaid in person as information often get confused when applying online Patient's son contacted and is agreeable to present in person however is out of town and will follow up upon his return Son confirms current care plan: patient's son will call daily,  patient's neighbors available to check in on patient check as well, pill box filled before he left.  This social worker will follow up with patient's son within the next 25 business days        Our next appointment is by telephone on 10/24/22 at 11am  Please call the care guide team at [phone] if you need to cancel or reschedule your appointment.   If you are experiencing a Mental Health or [place] or need someone to talk to, please call [phone]   Patient verbalizes understanding of instructions and care plan provided today and agrees to view in [place]. Active MyChart status and patient understanding of how to access instructions and care plan via MyChart confirmed with patient.     Telephone follow up appointment with care management team member scheduled for: 10/24/22  Elliot Gurney, [place] Worker  [facility] Care Management [phone]

## 2022-09-27 ENCOUNTER — Encounter: Payer: Self-pay | Admitting: Nurse Practitioner

## 2022-09-27 ENCOUNTER — Ambulatory Visit (INDEPENDENT_AMBULATORY_CARE_PROVIDER_SITE_OTHER): Payer: Medicare HMO | Admitting: Nurse Practitioner

## 2022-09-27 VITALS — BP 102/64 | HR 64 | Temp 99.2°F | Resp 16 | Ht 69.0 in | Wt 183.1 lb

## 2022-09-27 DIAGNOSIS — G3184 Mild cognitive impairment, so stated: Secondary | ICD-10-CM | POA: Diagnosis not present

## 2022-09-27 DIAGNOSIS — N183 Chronic kidney disease, stage 3 unspecified: Secondary | ICD-10-CM | POA: Diagnosis not present

## 2022-09-27 DIAGNOSIS — E1121 Type 2 diabetes mellitus with diabetic nephropathy: Secondary | ICD-10-CM

## 2022-09-27 DIAGNOSIS — I10 Essential (primary) hypertension: Secondary | ICD-10-CM

## 2022-09-27 LAB — COMPREHENSIVE METABOLIC PANEL
ALT: 7 U/L (ref 0–53)
AST: 11 U/L (ref 0–37)
Albumin: 3.9 g/dL (ref 3.5–5.2)
Alkaline Phosphatase: 68 U/L (ref 39–117)
BUN: 22 mg/dL (ref 6–23)
CO2: 25 mEq/L (ref 19–32)
Calcium: 9.2 mg/dL (ref 8.4–10.5)
Chloride: 100 mEq/L (ref 96–112)
Creatinine, Ser: 1.66 mg/dL — ABNORMAL HIGH (ref 0.40–1.50)
GFR: 39.69 mL/min — ABNORMAL LOW (ref >60.00)
Glucose, Bld: 348 mg/dL — ABNORMAL HIGH (ref 70–99)
Potassium: 4.1 mEq/L (ref 3.5–5.1)
Sodium: 135 mEq/L (ref 135–145)
Total Bilirubin: 0.5 mg/dL (ref 0.2–1.2)
Total Protein: 6.5 g/dL (ref 6.0–8.3)

## 2022-09-27 LAB — POCT GLYCOSYLATED HEMOGLOBIN (HGB A1C): Hemoglobin A1C: 12.4 % — AB (ref 4.0–5.6)

## 2022-09-27 LAB — CBC
HCT: 35.9 % — ABNORMAL LOW (ref 39.0–52.0)
Hemoglobin: 12.1 g/dL — ABNORMAL LOW (ref 13.0–17.0)
MCHC: 33.7 g/dL (ref 30.0–36.0)
MCV: 85.9 fl (ref 78.0–100.0)
Platelets: 180 10*3/uL (ref 150.0–400.0)
RBC: 4.18 Mil/uL — ABNORMAL LOW (ref 4.22–5.81)
RDW: 13.5 % (ref 11.5–15.5)
WBC: 7 10*3/uL (ref 4.0–10.5)

## 2022-09-27 NOTE — Patient Instructions (Addendum)
Nice to see you today I will be in touch with the labs and if I decide to change medications I want to see you in 3 months, sooner If you need me  You need to see the podiatrist Dr. Erling Conte. Please call  One of your toenails is growing under and can cut the skin  Address: [address] [place], [place], [place] [address] Phone: [phone]

## 2022-09-27 NOTE — Assessment & Plan Note (Signed)
Patient is on donepezil.  States his memory is "so-so".  Patient does present office by himself today.  Not accompanied by son.  Patient did drive himself.  Patient still lives at home alone.  Able to do his ADLs per his report.  Patient did look a little on Today states he is showering every other day.  Did discuss with patient at the need to be in assisted living setting.  He is followed by social work.  I have had this conversation with patient's son in office also.

## 2022-09-27 NOTE — Assessment & Plan Note (Signed)
Pending labs

## 2022-09-27 NOTE — Progress Notes (Signed)
Established Patient Office Visit  Subjective   Patient ID: Randy [name], male    DOB: 1946/03/18  Age: 77 y.o. MRN: [id number]  Chief Complaint  Patient presents with   Diabetes      DM2: states that he has not been checking his sugars. States that he is getting medication in the morning but not in the evening   States that he belvies he gets pill packs form the pharmacy   HTN: States he does not check his blood pressure at home and feels like he is doing well. States no falls lightheadedness or dizziness   MCI: States memory is doing "so-so". States that he bathes once every 2 days. States that he will shower. States that he will make his own meals like sandwichs. He will do one meal in the am and one in the pm. States that he is drinking sweet tea and water. States that 50/50 with the fluid intake. States that he does urinate about 2-3 times a day       Review of Systems  Constitutional:  Negative for chills and fever.  Respiratory:  Positive for shortness of breath (doe).   Cardiovascular:  Negative for chest pain.      Objective:     BP 102/64   Pulse 64   Temp 99.2 F (37.3 C)   Resp 16   Ht '5\' 9"'$  (1.753 m)   Wt 183 lb 2 oz (83.1 kg)   SpO2 97%   BMI 27.04 kg/m  BP Readings from Last 3 Encounters:  09/27/22 102/64  08/17/22 96/60  06/27/22 (!) 122/56   Wt Readings from Last 3 Encounters:  09/27/22 183 lb 2 oz (83.1 kg)  08/17/22 181 lb 12.8 oz (82.5 kg)  06/27/22 185 lb 4 oz (84 kg)      Physical Exam Vitals and nursing note reviewed.  Constitutional:      Appearance: Normal appearance.  Cardiovascular:     Rate and Rhythm: Normal rate and regular rhythm.     Pulses:          Dorsalis pedis pulses are 2+ on the right side and 2+ on the left side.       Posterior tibial pulses are 2+ on the right side and 2+ on the left side.     Heart sounds: Normal heart sounds.  Pulmonary:     Effort: Pulmonary effort is normal.     Breath sounds: Normal  breath sounds.  Feet:     Right foot:     Skin integrity: Dry skin present.     Toenail Condition: Right toenails are abnormally thick and long. Fungal disease present.    Left foot:     Skin integrity: Dry skin present.     Toenail Condition: Left toenails are abnormally thick and long. Fungal disease present. Skin:    General: Skin is warm.  Neurological:     Mental Status: He is alert.      Results for orders placed or performed in visit on 09/27/22  POCT glycosylated hemoglobin (Hb A1C)  Result Value Ref Range   Hemoglobin A1C 12.4 (A) 4.0 - 5.6 %   HbA1c POC (<> result, manual entry)     HbA1c, POC (prediabetic range)     HbA1c, POC (controlled diabetic range)        The ASCVD Risk score (Arnett DK, et al., 2019) failed to calculate for the following reasons:   The patient has a prior MI or  stroke diagnosis    Assessment & Plan:   Problem List Items Addressed This Visit       Cardiovascular and Mediastinum   Essential hypertension    Patient's blood pressure within normal limits.  Patient states he is getting about 50% of his medication and that he had a gas.  Continue medication as prescribed      Relevant Orders   CBC   Comprehensive metabolic panel     Endocrine   Type 2 diabetes with nephropathy ([place]) - Primary    Patient is not checking glucose at home.  He is taking medications unsure of how much and how often.  We did try to consolidate into extended release or once daily dosing.  Did reach out to pharmacist and they stated Rybelsus was approved.  We have discussed with patient about doing a GLP-1 receptor agonist injectable once a week.  Patient is not able to do it on his own.  And son says as he is not able to do it for the patient.  Will reach out to pharmacy and consult in regards to may be doing glipizide 10 mg XL 2 tablets every morning since patient's A1c is 12.4%.  Patient's A1c did trend down since last visit.      Relevant Orders   POCT  glycosylated hemoglobin (Hb A1C) (Completed)   CBC   Comprehensive metabolic panel     Genitourinary   CKD (chronic kidney disease) stage 3, GFR 30-59 ml/min (HCC)    Pending labs      Relevant Orders   CBC   Comprehensive metabolic panel     Other   Mild cognitive impairment    Patient is on donepezil.  States his memory is "so-so".  Patient does present office by himself today.  Not accompanied by son.  Patient did drive himself.  Patient still lives at home alone.  Able to do his ADLs per his report.  Patient did look a little on Today states he is showering every other day.  Did discuss with patient at the need to be in assisted living setting.  He is followed by social work.  I have had this conversation with patient's son in office also.       Return in about 3 months (around 12/26/2022) for DM recheck.    Romilda Garret, NP

## 2022-09-27 NOTE — Assessment & Plan Note (Signed)
Patient is not checking glucose at home.  He is taking medications unsure of how much and how often.  We did try to consolidate into extended release or once daily dosing.  Did reach out to pharmacist and they stated Rybelsus was approved.  We have discussed with patient about doing a GLP-1 receptor agonist injectable once a week.  Patient is not able to do it on his own.  And son says as he is not able to do it for the patient.  Will reach out to pharmacy and consult in regards to may be doing glipizide 10 mg XL 2 tablets every morning since patient's A1c is 12.4%.  Patient's A1c did trend down since last visit.

## 2022-09-27 NOTE — Assessment & Plan Note (Signed)
Patient's blood pressure within normal limits.  Patient states he is getting about 50% of his medication and that he had a gas.  Continue medication as prescribed

## 2022-10-02 ENCOUNTER — Ambulatory Visit (INDEPENDENT_AMBULATORY_CARE_PROVIDER_SITE_OTHER): Payer: Medicare HMO

## 2022-10-02 DIAGNOSIS — I255 Ischemic cardiomyopathy: Secondary | ICD-10-CM

## 2022-10-03 LAB — CUP PACEART REMOTE DEVICE CHECK
Battery Remaining Longevity: 38 mo
Battery Remaining Percentage: 40 %
Battery Voltage: 2.89 V
Brady Statistic AP VP Percent: 1.2 %
Brady Statistic AP VS Percent: 5.4 %
Brady Statistic AS VP Percent: 1 %
Brady Statistic AS VS Percent: 89 %
Brady Statistic RA Percent Paced: 1.6 %
Brady Statistic RV Percent Paced: 2.2 %
Date Time Interrogation Session: 20240306015015
HighPow Impedance: 75 Ohm
HighPow Impedance: 75 Ohm
Implantable Lead Connection Status: 753985
Implantable Lead Connection Status: 753985
Implantable Lead Implant Date: 20091022
Implantable Lead Implant Date: 20180322
Implantable Lead Location: 753859
Implantable Lead Location: 753860
Implantable Lead Model: 7121
Implantable Pulse Generator Implant Date: 20180322
Lead Channel Impedance Value: 1975 Ohm
Lead Channel Impedance Value: 300 Ohm
Lead Channel Pacing Threshold Amplitude: 0.75 V
Lead Channel Pacing Threshold Amplitude: 1.5 V
Lead Channel Pacing Threshold Pulse Width: 0.5 ms
Lead Channel Pacing Threshold Pulse Width: 1 ms
Lead Channel Sensing Intrinsic Amplitude: 1.5 mV
Lead Channel Sensing Intrinsic Amplitude: 11.1 mV
Lead Channel Setting Pacing Amplitude: 2 V
Lead Channel Setting Pacing Amplitude: 3 V
Lead Channel Setting Pacing Pulse Width: 1 ms
Lead Channel Setting Sensing Sensitivity: 0.5 mV
Pulse Gen Serial Number: 7413245

## 2022-10-18 ENCOUNTER — Other Ambulatory Visit: Payer: Self-pay | Admitting: Nurse Practitioner

## 2022-10-18 ENCOUNTER — Other Ambulatory Visit: Payer: Self-pay | Admitting: Student

## 2022-10-18 DIAGNOSIS — I472 Ventricular tachycardia, unspecified: Secondary | ICD-10-CM

## 2022-10-18 DIAGNOSIS — G3184 Mild cognitive impairment, so stated: Secondary | ICD-10-CM

## 2022-10-19 ENCOUNTER — Telehealth: Payer: Self-pay

## 2022-10-19 NOTE — Progress Notes (Unsigned)
Care Management & Coordination Services Pharmacy Team  Reason for Encounter: Medication coordination and delivery  Contacted patient to discuss medications and coordinate delivery from [facility]. {US HC Outreach:28874} Cycle dispensing form sent to *** for review.   Last adherence delivery date:10/02/22      Patient is due for next adherence delivery on: 10/31/22  This delivery to include: Adherence Packaging  30 Days  Furosemide 20 mg - 1 daily Allopurinol 100 mg - 1/2 tablet daily  Tamsulosin 0.4mg  - 1 daily  Finasteride 5 mg - 1 daily  Atorvastatin 80 mg - 1 tablet  Glipizide ER 10 mg - 1 daily   Zetia 10 mg- 1 tablet daily Donepezil 5mg  -1 tablet  Amiodarone 200mg  - 1 tablet Xarelto 15mg  - take 1 tablet daily Losartan 25mg  -take 1 tablet daily Metoprolol succinate 25mg  -take 1 tablet daily  Patient declined the following medications this month: ***  Refills requested from providers include:amiodarone  {Delivery UJ:6107908   Any concerns about your medications? {yes/no:20286}  How often do you forget or accidentally miss a dose? {Missed doses:25554}  Do you use a pillbox? {yes/no:20286}  Is patient in packaging {yes/no:20286}  If yes  What is the date on your next pill pack?  Any concerns or issues with your packaging?   Recent blood pressure readings are as follows:***  Recent blood glucose readings are as follows:***   Chart review: Recent office visits:  ***  Recent consult visits:  ***  Hospital visits:  None in previous 6 months  Medications: Outpatient Encounter Medications as of 10/19/2022  Medication Sig Note   Accu-Chek Softclix Lancets lancets USE AS DIRECTED  TO TEST BLOOD SUGAR ONE TIME DAILY  AND AS NEEDED    Alcohol Swabs (B-D SINGLE USE SWABS REGULAR) PADS Use as instructed to clean area for glucose monitoring once daily and as needed.  Diagnosis:  E11.21  Non insulin-dependent    allopurinol (ZYLOPRIM) 100 MG tablet TAKE 1/2  TABLET BY MOUTH AT NOON    amiodarone (PACERONE) 200 MG tablet Take 1 tablet (200 mg total) by mouth daily.    atorvastatin (LIPITOR) 80 MG tablet TAKE ONE TABLET BY MOUTH AT NOON    carvedilol (COREG) 3.125 MG tablet TAKE ONE TABLET BY MOUTH TWICE DAILY    donepezil (ARICEPT) 5 MG tablet TAKE ONE TABLET BY MOUTH AT NOON    ezetimibe (ZETIA) 10 MG tablet TAKE ONE TABLET BY MOUTH AT NOON    finasteride (PROSCAR) 5 MG tablet TAKE ONE TABLET BY MOUTH AT NOON    furosemide (LASIX) 20 MG tablet TAKE ONE TABLET BY MOUTH AT NOON    gabapentin (NEURONTIN) 300 MG capsule TAKE TWO CAPSULES BY MOUTH TWICE DAILY    glipiZIDE (GLUCOTROL XL) 10 MG 24 hr tablet TAKE ONE TABLET BY MOUTH AT NOON    losartan (COZAAR) 25 MG tablet Take 1 tablet (25 mg total) by mouth daily.    metoprolol succinate (TOPROL XL) 25 MG 24 hr tablet Take 1 tablet (25 mg total) by mouth daily.    Rivaroxaban (XARELTO) 15 MG TABS tablet Take 1 tablet (15 mg total) by mouth daily with supper.    Semaglutide (RYBELSUS) 3 MG TABS Take 3 mg by mouth daily. Via [employer] 09/11/2022: Received in office 09/10/22   tamsulosin (FLOMAX) 0.4 MG CAPS capsule TAKE ONE CAPSULE BY MOUTH AT NOON    No facility-administered encounter medications on file as of 10/19/2022.   BP Readings from Last 3 Encounters:  09/27/22  102/64  08/17/22 96/60  06/27/22 (!) 122/56    Pulse Readings from Last 3 Encounters:  09/27/22 64  08/17/22 76  06/27/22 (!) 53    Lab Results  Component Value Date/Time   HGBA1C 12.4 (A) 09/27/2022 11:13 AM   HGBA1C 13.6 (H) 06/27/2022 12:35 PM   HGBA1C 9.1 (A) 03/16/2022 11:12 AM   HGBA1C 8.9 (H) 03/10/2021 10:25 AM   HGBA1C 6.4 06/16/2018 09:19 AM   Lab Results  Component Value Date   CREATININE 1.66 (H) 09/27/2022   BUN 22 09/27/2022   GFR 39.69 (L) 09/27/2022   GFRNONAA 23 (L) 06/10/2019   GFRAA 26 (L) 06/10/2019   NA 135 09/27/2022   K 4.1 09/27/2022   CALCIUM 9.2 09/27/2022   CO2 25 09/27/2022     Charlene Brooke, PharmD notified  [name], [place] Assistant [phone]

## 2022-10-24 ENCOUNTER — Ambulatory Visit: Payer: Self-pay | Admitting: *Deleted

## 2022-10-24 ENCOUNTER — Encounter: Payer: Self-pay | Admitting: *Deleted

## 2022-10-24 ENCOUNTER — Telehealth: Payer: Self-pay | Admitting: *Deleted

## 2022-10-24 NOTE — Patient Outreach (Signed)
  Care Coordination   Follow Up Visit Note   10/24/2022 Name: NEY TRIPODI MRN: [id number] DOB: 12/16/1945  [name] is a 77 y.o. year old male who sees [name], Alyson Locket, NP for primary care. I spoke with  [name] by phone today.  What matters to the patients health and wellness today?  Assisted Living Placement   Goals Addressed             This Visit's Progress    Medicaid application process       Care Coordination Interventions: Phone call to the Department of Social Services to confirm that LTC medicaid application has been received and is in process-confirmed that application has been received, however she could not discuss the status-they will ask LTC social worker to call this social worker back         SDOH assessments and interventions completed:  No     Care Coordination Interventions:  Yes, provided  Interventions Today    Flowsheet Row Most Recent Value  Chronic Disease   Chronic disease during today's visit Other  [Mild cognitive impairment]  General Interventions   General Interventions Discussed/Reviewed General Interventions Reviewed, [employer], Level of Care  Level of Care Applications  Applications Medicaid  [patient's son confirms that he went the to the Department of Social Services to follow up on Medicaid application for long term care-follow up phone call made to confirm receipt-LTC social worker will call this social worker back to to confirm status]  Education Interventions   Applications Medicaid  [patient's son confirms that he went the to the Department of Social Services to follow up on Medicaid application for long term care-follow up phone call made to confirm receipt-LTC social worker will call this social worker back to to confirm status]       Follow up plan: Follow up call scheduled for 10/31/22    Encounter Outcome:  Pt. Visit Completed

## 2022-10-24 NOTE — Patient Instructions (Signed)
Visit Information  Thank you for taking time to visit with me today. Please don't hesitate to contact me if I can be of assistance to you.   Following are the goals we discussed today:   Goals Addressed             This Visit's Progress    Medicaid application process       Care Coordination Interventions: Phone call to the Department of Social Services to confirm that LTC medicaid application has been received and is in process-confirmed that application has been received, however she could not discuss the status-they will ask LTC social worker to call this social worker back         Our next appointment is by telephone on 10/31/22 at 10am  Please call the care guide team at [phone] if you need to cancel or reschedule your appointment.   If you are experiencing a Mental Health or [place] or need someone to talk to, please call 911   The patient verbalized understanding of instructions, educational materials, and care plan provided today and DECLINED offer to receive copy of patient instructions, educational materials, and care plan.   Telephone follow up appointment with care management team member scheduled for: 10/31/22  Elliot Gurney, [place] Worker  [facility] Care Management [phone]

## 2022-10-24 NOTE — Patient Outreach (Signed)
  Care Coordination   Follow Up Visit Note   10/24/2022 Name: Randy [name] MRN: [id number] DOB: 01-25-1946  Randy [name] is a 77 y.o. year old male who sees [name], Alyson Locket, NP for primary care. I spoke with  Randy [name] and Department of Social Services by phone today.  What matters to the patients health and wellness today?  Long term care    Goals Addressed             This Visit's Progress    Medicaid application process       Care Coordination Interventions: Patient's son agreeable to LTC in a skilled facility-will discuss further with patient's son on 10/31/22        SDOH assessments and interventions completed:  No     Care Coordination Interventions:  Yes, provided   Interventions Today    Flowsheet Row Most Recent Value  Chronic Disease   Chronic disease during today's visit Other  [Mild Cognitive impairment]  General Interventions   General Interventions Discussed/Reviewed General Interventions Reviewed, [employer], Level of Care  Level of Care Applications  [Collaboration phone call to the Department of Social Services -confirmed that patient is over the finaical limit for special assistance medicaid(limit is 1770.00) LTC medicaid submitted on 10/09/22 and is in process for skllled level of care]       Follow up plan: Follow up call scheduled for 10/31/22    Encounter Outcome:  Pt. Visit Completed

## 2022-10-24 NOTE — Patient Outreach (Signed)
  Care Coordination   10/24/2022 Name: JOSHU DEROSIER MRN: [id number] DOB: [birth year]   Care Coordination Outreach Attempts:  An unsuccessful telephone outreach was attempted for a scheduled appointment today.  Follow Up Plan:  Additional outreach attempts will be made to offer the patient care coordination information and services.   Encounter Outcome:  No Answer   Care Coordination Interventions:  No, not indicated    Eleanora Guinyard, [place] Worker  [facility] Care Management [phone]

## 2022-10-29 ENCOUNTER — Ambulatory Visit: Payer: Medicare HMO | Attending: Internal Medicine

## 2022-10-29 ENCOUNTER — Ambulatory Visit: Payer: Self-pay | Admitting: *Deleted

## 2022-10-29 DIAGNOSIS — I5022 Chronic systolic (congestive) heart failure: Secondary | ICD-10-CM

## 2022-10-29 DIAGNOSIS — Z95810 Presence of automatic (implantable) cardiac defibrillator: Secondary | ICD-10-CM

## 2022-10-30 NOTE — Progress Notes (Signed)
EPIC Encounter for ICM Monitoring  Patient Name: Randy [name] is a 77 y.o. male Date: 10/30/2022 Primary Care Physican: Michela Pitcher, NP Primary Cardiologist: Lovena Le Electrophysiologist: Lovena Le 09/21/2022 Office Weight: 189 lbs   AT/AF Burden: 1.9%  (taking Xarelto)     Transmission reviewed.    [place] thoracic impedance suggesting possible fluid accumulation starting 3/23 but trending back close to baseline.    Prescribed: Furosemide 20 mg 1 tablet daily   Labs: 06/27/2022 Creatinine 1.83, BUN 23, Potassium 4.1, Sodium 132, GFR 35.37 06/14/2022 Creatinine 1.68, BUN 23, Potassium 4.6, Sodium 134, GFR 42 03/16/2022 Creatinine 1.71, BUN 24, Potassium 3.7, Sodium 136, GFR 38.45 12/15/2021 Creatinine 1.73, BUN 25, Potassium 4.7, Sodium 135, GFR 37.98 12/01/2021 Creatinine 2.02, BUN 37, Potassium 5.3, Sodium 135, GFR 31.54  11/28/2021 Creatinine 2.39, BUN 32, Potassium 3.8, Sodium 134, GFR 25.78  A complete set of results can be found in Results Review.   Recommendations:  No changes.     Follow-up plan: ICM clinic phone appointment on 12/03/2022.  91 day device clinic remote transmission scheduled for 01/01/2023.    EP/Cardiology Office Visits:  12/21/2022 with Tommye Standard, PA (message sent to front desk to have pt update DPR).     Copy of ICM check sent to Dr. Lovena Le.  3 month ICM trend: 10/30/2022.    12-14 Month ICM trend:     Rosalene Billings, RN 10/30/2022 1:11 PM

## 2022-10-30 NOTE — Patient Outreach (Signed)
  Care Coordination   Follow Up Visit Note   10/30/2022 Name: Randy [name] MRN: [id number] DOB: Dec 17, 1945 [place] Randy [name] is a 77 y.o. year old male who sees [name], Alyson Locket, NP for primary care. I spoke with  Randy [name] son  by phone on 10/29/22.  What matters to the patients health and wellness today?  Long term care placement    Goals Addressed             This Visit's Progress    Medicaid application process       Care Coordination Interventions: Patient's son agreeable to LTC in a skilled facility and will begin to visit facilities for choice CSW to continue to follow up on special assistance Medicaid  process and available facilities        SDOH assessments and interventions completed:  No     Care Coordination Interventions:  Yes, provided  Interventions Today    Flowsheet Row Most Recent Value  Chronic Disease   Chronic disease during today's visit Other  [Mild cognitive impairment-patient's son to schedule provider office visit to assess progression of cognitive impairment]  General Interventions   General Interventions Discussed/Reviewed General Interventions Reviewed, Level of Care  Level of Care Applications  [Special assistance medicaid application process discussed]  Applications Medicaid  [special assistance medicaid application process discussed]  Education Interventions   Applications Medicaid  [special assistance medicaid application process discussed]       Follow up plan: Follow up call scheduled for 11/12/22    Encounter Outcome:  Pt. Visit Completed

## 2022-10-30 NOTE — Patient Instructions (Signed)
Visit Information  Thank you for taking time to visit with me today. Please don't hesitate to contact me if I can be of assistance to you.   Following are the goals we discussed today:   Goals Addressed             This Visit's Progress    Medicaid application process       Care Coordination Interventions: Patient's son agreeable to LTC in a skilled facility and will begin to visit facilities for choice CSW to continue to follow up on special assistance Medicaid  process and available facilities        Our next appointment is by telephone on 11/12/22 at 2pm  Please call the care guide team at [phone] if you need to cancel or reschedule your appointment.   If you are experiencing a Mental Health or [place] or need someone to talk to, please call 911   Patient verbalizes understanding of instructions and care plan provided today and agrees to view in [place]. Active MyChart status and patient understanding of how to access instructions and care plan via MyChart confirmed with patient.     Telephone follow up appointment with care management team member scheduled for: 11/12/22   Elliot Gurney, [place] Worker  [facility] Care Management [phone]

## 2022-10-31 ENCOUNTER — Encounter: Payer: Self-pay | Admitting: *Deleted

## 2022-10-31 ENCOUNTER — Encounter: Payer: Medicare HMO | Admitting: *Deleted

## 2022-11-01 ENCOUNTER — Other Ambulatory Visit: Payer: Self-pay | Admitting: *Deleted

## 2022-11-01 MED ORDER — LOSARTAN POTASSIUM 25 MG PO TABS: 25.0000 mg | ORAL_TABLET | Freq: Every day | ORAL | 2 refills | Status: DC

## 2022-11-07 ENCOUNTER — Ambulatory Visit: Payer: Self-pay

## 2022-11-07 NOTE — Patient Outreach (Signed)
  Care Coordination   Follow Up Visit Note   11/07/2022 Name: Randy [name] MRN: [id number] DOB: 10/11/1945  Randy [name] is a 77 y.o. year old male who sees [name], [name], NP for primary care. I  spoke with son/ dpr Randy [name] today  What matters to the patients health and wellness today?  Son states he is still waiting for long term care Medicaid application approval.  Son states he feels patients blood sugars are out of "wack."   Son states patient has verbalized having difficulty seeing for approximately a week. He states patient loses his reading glasses frequently.  Son states he took patient to the store for reading glasses but patient reports he was unable to see out of any of them.  He report patient continues to drive on occasion short distances.  Son states he is also concerned patients kidneys may not be functioning well. Son states he feels patient needs to be seen by his doctor.   RNCM called primary care provider office and spoke with Randy [name] to request primary provider appointment.  Appointment for patient scheduled for tomorrow 11/08/22 at 2:20 pm.  Son called back and notified of scheduled appointment. Son voiced appreciation for the assistance.   Goals Addressed             This Visit's Progress    Management/ education for health conditions       Interventions Today    Flowsheet Row Most Recent Value  Chronic Disease   Chronic disease during today's visit Diabetes, Congestive Heart Failure (CHF)  General Interventions   General Interventions Discussed/Reviewed General Interventions Reviewed, Doctor Visits, Labs  [evaluation of current treatment plan for DM, HF and patients adherence to treatment plan as established by provider.  Assessed for increase in symptoms related to DM/.HF. Encouraged son not to allow patient to drive until evaluated by provider.]  Labs Kidney Function  [Discussed previous lab work with son]  Doctor Visits Discussed/Reviewed Doctor Visits  Reviewed  Randy [name] to set up primary provider office visit due to change in condition. Reviewed scheduled / upcoming provider visits.]  Nutrition Interventions   Nutrition Discussed/Reviewed Nutrition Reviewed  Randy [name] nutritional intake.]  Pharmacy Interventions   Pharmacy Dicussed/Reviewed Pharmacy Topics Reviewed  [medications reviewed and compliance discussed.]                 SDOH assessments and interventions completed:  No     Care Coordination Interventions:  Yes, provided   Follow up plan: Follow up call scheduled for 12/07/22    Encounter Outcome:  Pt. Visit Completed   Randy Ina RN,BSN,CCM [facility] Care Coordination [phone] direct line

## 2022-11-08 ENCOUNTER — Telehealth: Payer: Self-pay

## 2022-11-08 ENCOUNTER — Ambulatory Visit (INDEPENDENT_AMBULATORY_CARE_PROVIDER_SITE_OTHER): Payer: Medicare HMO | Admitting: Nurse Practitioner

## 2022-11-08 ENCOUNTER — Encounter: Payer: Self-pay | Admitting: Nurse Practitioner

## 2022-11-08 VITALS — BP 112/60 | HR 64 | Temp 99.5°F | Resp 16 | Ht 69.0 in | Wt 183.5 lb

## 2022-11-08 DIAGNOSIS — H547 Unspecified visual loss: Secondary | ICD-10-CM

## 2022-11-08 DIAGNOSIS — G3184 Mild cognitive impairment, so stated: Secondary | ICD-10-CM | POA: Diagnosis not present

## 2022-11-08 DIAGNOSIS — E1121 Type 2 diabetes mellitus with diabetic nephropathy: Secondary | ICD-10-CM | POA: Diagnosis not present

## 2022-11-08 NOTE — Assessment & Plan Note (Signed)
Repeat MMSE today patient scored less than he had before.  Currently on donepezil continue patient/stratify patient placement in facility

## 2022-11-08 NOTE — Patient Outreach (Signed)
  Care Coordination   Follow Up Visit Note   11/08/2022 Name: Randy [name] MRN: [id number] DOB: Mar 24, 1946  Randy [name] is a 77 y.o. year old male who sees [name], [name], NP for primary care. I  spoke with patient and son, Randy [name] at provider office.   What matters to the patients health and wellness today?  Patient states he is doing ok. Per discussion with provider patients eye exam result 20/50 bilaterally. Provider advised patient not to drive until evaluated by eye doctor.  Provider referred for eye doctor follow up    Goals Addressed             This Visit's Progress    Management/ education for health conditions       Interventions Today    Flowsheet Row Most Recent Value  Chronic Disease   Chronic disease during today's visit Diabetes  General Interventions   General Interventions Discussed/Reviewed Doctor Visits  [Present at provider visit with patient / son.  Reinforced provider plan of care regarding seeing eye doctor and not driving until evaluated by eye doctor. Advised will inform social worker papers received from DSS.]  Pharmacy Interventions   Pharmacy Dicussed/Reviewed Pharmacy Topics Reviewed  [Discussed importance of taking medication as prescribed.]                 SDOH assessments and interventions completed:  No     Care Coordination Interventions:  Yes, provided   Follow up plan: Follow up call scheduled for as previously scheduled    Encounter Outcome:  Pt. Visit Completed   [name] RN,BSN,CCM [facility] Care Coordination [phone] direct line

## 2022-11-08 NOTE — Progress Notes (Signed)
Acute Office Visit  Subjective:     Patient ID: Randy [name], male    DOB: May 20, 1946, 77 y.o.   MRN: [id number]  Chief Complaint  Patient presents with   Blurred Vision    Can't see out of reading glasses     HPI Patient is in today for vision problems  States that he has been having problems with vision for at least a week or two. States that he wanted some more reading glasses. States that he has not seen an eye care professional.  We have been dealing with medication adherence issues given the patient's cognitive impairment and ability to verbalize taking medications or not.  Been battling with patient's diabetes.  States has been drinking [employer] and sweet tea at home sugar today was 361.  Son states that he is working with DSS and tyring to get I'm into a facility.      11/08/2022    3:03 PM 06/29/2021   12:43 PM 05/30/2020    8:44 AM  MMSE - Mini Mental State Exam  Orientation to time 1 3 3   Orientation to Place 5 3 5   Registration 3 3 3   Attention/ Calculation 5 4 5   Recall 0 0 0  Language- name 2 objects 1 2 2   Language- repeat 1 1 1   Language- follow 3 step command 3 3 3   Language- read & follow direction 1 1 1   Write a sentence 1 1 1   Copy design 0 1 0  Total score 21 22 24     Vision Screening   Right eye Left eye Both eyes  Without correction 20/50 20/40 20/50   With correction         Review of Systems  Constitutional:  Negative for chills and fever.  Eyes:  Positive for blurred vision. Negative for double vision, photophobia, pain, discharge and redness.  Respiratory:  Negative for shortness of breath.   Cardiovascular:  Negative for chest pain.  Neurological:  Negative for headaches.        Objective:    BP 112/60   Pulse 64   Temp 99.5 F (37.5 C)   Resp 16   Ht 5\' 9"  (1.753 m)   Wt 183 lb 8 oz (83.2 kg)   SpO2 97%   BMI 27.10 kg/m    Physical Exam Vitals and nursing note reviewed.  Constitutional:      Appearance:  Normal appearance.  HENT:     Mouth/Throat:     Mouth: Mucous membranes are moist.     Pharynx: Oropharynx is clear.  Eyes:     Extraocular Movements: Extraocular movements intact.     Pupils: Pupils are equal, round, and reactive to light.  Pulmonary:     Effort: Pulmonary effort is normal.  Neurological:     Mental Status: He is alert.     No results found for any visits on 11/08/22.      Assessment & Plan:   Problem List Items Addressed This Visit       Endocrine   Type 2 diabetes with nephropathy    Patient blood glucose 361 office this is nonfasting.  Patient does have adherence issues with medication.  Encourage patient drink more water and less [employer] and sweet tea      Relevant Orders   Ambulatory referral to Ophthalmology     Other   Mild cognitive impairment    Repeat MMSE today patient scored less than he had before.  Currently  on donepezil continue patient/stratify patient placement in facility      Vision problems - Primary    States he requested for new reading glasses.  They went to the store and he did not find any pair that would help his vision.  20/50 on the Snellen chart today.  Did encourage patient not to drive until he is evaluated by air care professional.  Patient's son Chanetta Marshall was at bedside and acknowledged that conversation.  Urgent referral placed for ophthalmology likely secondary to uncontrolled diabetes but could have other etiology go       Relevant Orders   Visual acuity screening    No orders of the defined types were placed in this encounter.   Return in about 7 weeks (around 12/27/2022) for DM recheck.  Audria Nine, NP

## 2022-11-08 NOTE — Assessment & Plan Note (Addendum)
Patient blood glucose 361 office this is nonfasting.  Patient does have adherence issues with medication.  Encourage patient drink more water and less [employer] and sweet tea

## 2022-11-08 NOTE — Patient Instructions (Signed)
I recommend that you do not drive until you get your eyes checked I have placed a referral for the eye doctor.  Follow up with me in approx 7 weeks for a diabetes follow up

## 2022-11-08 NOTE — Assessment & Plan Note (Signed)
States he requested for new reading glasses.  They went to the store and he did not find any pair that would help his vision.  20/50 on the Snellen chart today.  Did encourage patient not to drive until he is evaluated by air care professional.  Patient's son Chanetta Marshall was at bedside and acknowledged that conversation.  Urgent referral placed for ophthalmology likely secondary to uncontrolled diabetes but could have other etiology go

## 2022-11-12 ENCOUNTER — Ambulatory Visit: Payer: Self-pay | Admitting: *Deleted

## 2022-11-12 NOTE — Progress Notes (Signed)
Remote ICD transmission.   

## 2022-11-13 NOTE — Patient Instructions (Signed)
Visit Information  Thank you for taking time to visit with me today. Please don't hesitate to contact me if I can be of assistance to you.   Following are the goals we discussed today:   Goals Addressed             This Visit's Progress    Medicaid application process       Care Coordination Interventions: Patient's son agreeable to LTC in a skilled facility and will begin to visit facilities for choice Patient's son will complete required documentation to complete long term care Medicaid application  CSW to continue to follow up on long term care Medicaid  process and available facilities Patient's son willing to consider Adult Day Care as a possible option while long term care placement is being identified        Our next appointment is by telephone on 12/03/22 at 1pm  Please call the care guide team at [phone] if you need to cancel or reschedule your appointment.   If you are experiencing a Mental Health or Behavioral Health Crisis or need someone to talk to, please call 911   Patient verbalizes understanding of instructions and care plan provided today and agrees to view in MyChart. Active MyChart status and patient understanding of how to access instructions and care plan via MyChart confirmed with patient.     Telephone follow up appointment with care management team member scheduled for:12/03/22  Verna Czech, [place] Clinical Social Worker  [facility] Care Management [phone]

## 2022-11-13 NOTE — Patient Outreach (Signed)
  Care Coordination   Follow Up Visit Note   11/13/2022 late entry Name: Randy [name] MRN: [id number] DOB: 1946-07-13  Randy [name] is a 77 y.o. year old male who sees [name], [name], NP for primary care. I spoke with  Randy [name] son by phone on 11/12/22.  What matters to the patients health and wellness today?  Patient's son interested in long term care for patient  due to patient's cognitive limits. Patient's son agreeable to Adult Day Care possibly until long term care can be identified.    Goals Addressed             This Visit's Progress    Medicaid application process       Care Coordination Interventions: Patient's son agreeable to LTC in a skilled facility and will begin to visit facilities for choice Patient's son will complete required documentation to complete long term care Medicaid application  CSW to continue to follow up on long term care Medicaid  process and available facilities Patient's son willing to consider Adult Day Care as a possible option while long term care placement is being identified        SDOH assessments and interventions completed:  No     Care Coordination Interventions:  Yes, provided  Interventions Today    Flowsheet Row Most Recent Value  Chronic Disease   Chronic disease during today's visit Diabetes  General Interventions   General Interventions Discussed/Reviewed [employer]  [discussed [facility] as possible option-patient's son agreeable]  Level of Care Public librarian Medicaid  [confirmed that son received response back from the Department of Social Services-son encouraged need to complete and return to determine Medicaid eligibility]  Education Interventions   Applications Medicaid  [confirmed that son received response back from the Department of Social Services-son encouraged need to complete and return to determine Medicaid eligibility]       Follow up plan: Follow up call  scheduled for 12/03/22    Encounter Outcome:  Pt. Visit Completed

## 2022-11-15 ENCOUNTER — Encounter: Payer: Self-pay | Admitting: *Deleted

## 2022-11-15 NOTE — Patient Outreach (Signed)
  Care Coordination   Multidisciplinary Case Review Note    11/15/2022 Name: Randy [name] MRN: [id number] DOB: 1946/02/15  Randy [name] is a 77 y.o. year old male who sees [name], [name], NP for primary care.  The  multidisciplinary care team met today to review patient care needs and barriers.     Goals Addressed             This Visit's Progress    difficult case discussion       Care Coordination Interventions: RNCM to assist patient and caregiver with diabetes management CSW to continue to assist with long term care Medicaid application for placement purposes          SDOH assessments and interventions completed:  No     Care Coordination Interventions Activated:  Yes   Care Coordination Interventions:  Yes, provided   Follow up plan: Follow up call scheduled for 12/03/22 LCSW 12/07/22 RN    Multidisciplinary Team Attendees:   [place], LCSW [name], RN Kemper Durie, RN [place], [place] Gean Maidens, RN  Scribe for Multidisciplinary Case Review:  [place], [place]

## 2022-11-19 ENCOUNTER — Telehealth: Payer: Self-pay

## 2022-11-19 NOTE — Progress Notes (Signed)
Care Management & Coordination Services Pharmacy Team  Reason for Encounter: Medication coordination and delivery  Contacted patient to discuss medications and coordinate delivery from [facility]. Spoke with caregiver on 11/19/2022  spoke with son Chanetta Marshall Cycle dispensing form [name]  for review.   Patient is due for next adherence delivery on: 11/29/22 ( no delivery)  Patient declined the following medications this month: Furosemide 20 mg - 1 daily Allopurinol 100 mg - 1/2 tablet daily  Tamsulosin 0.4mg  - 1 daily  Finasteride 5 mg - 1 daily  Atorvastatin 80 mg - 1 tablet  Glipizide ER 10 mg - 1 daily   Zetia 10 mg- 1 tablet daily Donepezil  -1 tablet  Amiodarone  - 1 tablet Losartan  -take 1 tablet daily Metoprolol succinate  -take 1 tablet daily  No refill request needed.   Any concerns about your medications? According to son Chanetta Marshall, patient has surplus of medications on hand- declines delivery and will reach out next month    Recent blood pressure readings are as follows: none available   Recent blood glucose readings are as follows: none available   Chart review: Recent office visits:  11/08/22- Fayrene Fearing [name],NP(PCP)- blurred vision,referral for ophthalmologly Non fasting BG 361.F/U 7  weeks   Recent consult visits:  None since last contact   Hospital visits:  None in previous 6 months  Medications: Outpatient Encounter Medications as of 11/19/2022  Medication Sig Note   Accu-Chek Softclix Lancets lancets USE AS DIRECTED  TO TEST BLOOD SUGAR ONE TIME DAILY  AND AS NEEDED    Alcohol Swabs (B-D SINGLE USE SWABS REGULAR) PADS Use as instructed to clean area for glucose monitoring once daily and as needed.  Diagnosis:  E11.21  Non insulin-dependent    allopurinol (ZYLOPRIM) 100 MG tablet TAKE 1/2 TABLET BY MOUTH AT NOON    amiodarone (PACERONE) 200 MG tablet Take 1 tablet (200 mg total) by mouth daily.    atorvastatin (LIPITOR)  80 MG tablet TAKE ONE TABLET BY MOUTH AT NOON    carvedilol (COREG) 3.125 MG tablet TAKE ONE TABLET BY MOUTH TWICE DAILY    donepezil (ARICEPT) 5 MG tablet TAKE ONE TABLET BY MOUTH AT NOON    ezetimibe (ZETIA) 10 MG tablet TAKE ONE TABLET BY MOUTH AT NOON    finasteride (PROSCAR) 5 MG tablet TAKE ONE TABLET BY MOUTH AT NOON    furosemide (LASIX) 20 MG tablet TAKE ONE TABLET BY MOUTH AT NOON    gabapentin (NEURONTIN) 300 MG capsule TAKE TWO CAPSULES BY MOUTH TWICE DAILY    glipiZIDE (GLUCOTROL XL) 10 MG 24 hr tablet TAKE ONE TABLET BY MOUTH AT NOON    losartan (COZAAR) 25 MG tablet Take 1 tablet (25 mg total) by mouth daily.    metoprolol succinate (TOPROL XL) 25 MG 24 hr tablet Take 1 tablet (25 mg total) by mouth daily.    Rivaroxaban (XARELTO) 15 MG TABS tablet Take 1 tablet (15 mg total) by mouth daily with supper.    Semaglutide (RYBELSUS) 3 MG TABS Take 3 mg by mouth daily. Via [employer] 09/11/2022: Received in office 09/10/22   tamsulosin (FLOMAX) 0.4 MG CAPS capsule TAKE ONE CAPSULE BY MOUTH AT NOON    No facility-administered encounter medications on file as of 11/19/2022.   BP Readings from Last 3 Encounters:  11/08/22 112/60  09/27/22 102/64  08/17/22 96/60    Pulse Readings from Last 3 Encounters:  11/08/22 64  09/27/22 64  08/17/22 76  Lab Results  Component Value Date/Time   HGBA1C 12.4 (A) 09/27/2022 11:13 AM   HGBA1C 13.6 (H) 06/27/2022 12:35 PM   HGBA1C 9.1 (A) 03/16/2022 11:12 AM   HGBA1C 8.9 (H) 03/10/2021 10:25 AM   HGBA1C 6.4 06/16/2018 09:19 AM   Lab Results  Component Value Date   CREATININE 1.66 (H) 09/27/2022   BUN 22 09/27/2022   GFR 39.69 (L) 09/27/2022   GFRNONAA 23 (L) 06/10/2019   GFRAA 26 (L) 06/10/2019   NA 135 09/27/2022   K 4.1 09/27/2022   CALCIUM 9.2 09/27/2022   CO2 25 09/27/2022     Al Corpus, PharmD notified  Burt Knack, [facility] Clinical Pharmacy Assistant [phone]

## 2022-11-20 NOTE — Telephone Encounter (Signed)
Patient has declined medication pill pack delivery for 2nd month in a row now (last delivery of 30-day supply was 09/28/22)  Patient has struggled with medication adherence due to declining cognitive status. His son is working on getting assistance/facility placement. Adherence is not likely to improve until facility placement/home aid established.

## 2022-11-21 NOTE — Telephone Encounter (Signed)
Thanks for the update

## 2022-12-03 ENCOUNTER — Ambulatory Visit: Payer: Self-pay | Admitting: *Deleted

## 2022-12-03 NOTE — Patient Instructions (Signed)
Visit Information  Thank you for taking time to visit with me today. Please don't hesitate to contact me if I can be of assistance to you.   Following are the goals we discussed today:   Goals Addressed             This Visit's Progress    Medicaid application process       Care Coordination Interventions: Confirmed that son will pause on LTC placement at this time-will continue to assist with medication adherence, meals and transportation to medical appointments       If you are experiencing a Mental Health or Behavioral Health Crisis or need someone to talk to, please call 911   Patient verbalizes understanding of instructions and care plan provided today and agrees to view in MyChart. Active MyChart status and patient understanding of how to access instructions and care plan via MyChart confirmed with patient.     No further follow up required: patient's son to contact this Child psychotherapist with any additional community resource needs  [employer], LCSW Clinical Social Worker  [facility] Care Management [phone]

## 2022-12-03 NOTE — Patient Outreach (Signed)
  Care Coordination   Follow Up Visit Note   12/03/2022 Name: Randy [name] MRN: [id number] DOB: Oct 31, 1945  Randy [name] is a 77 y.o. year old male who sees [name], [name], NP for primary care. I spoke with  Randy [name] 's son by phone today.  What matters to the patients health and wellness today?  Patient's son would like to pause on long term care plan at this time.   Goals Addressed             This Visit's Progress    Medicaid application process       Care Coordination Interventions: Confirmed that son will pause on LTC placement at this time-will continue to assist with medication adherence, meals and transportation to medical appointments        SDOH assessments and interventions completed:  No     Care Coordination Interventions:  Yes, provided  Interventions Today    Flowsheet Row Most Recent Value  Chronic Disease   Chronic disease during today's visit Diabetes  General Interventions   General Interventions Discussed/Reviewed General Interventions Discussed  Level of Care Applications, Skilled Nursing Facility  [Followed up on status of LTC Medicaid application-patient's son confirmed that time provided to get additional documents in to complete application has expired-plan is to pause on placement at this time due to unavoidable personal circumstances]  Safety Interventions   Safety Discussed/Reviewed --  [son to limit patient's driving due to episodes of getting lost]       Follow up plan: No further intervention required.   Encounter Outcome:  Pt. Visit Completed

## 2022-12-04 ENCOUNTER — Ambulatory Visit (INDEPENDENT_AMBULATORY_CARE_PROVIDER_SITE_OTHER): Payer: Medicare HMO

## 2022-12-04 DIAGNOSIS — I5022 Chronic systolic (congestive) heart failure: Secondary | ICD-10-CM

## 2022-12-04 DIAGNOSIS — Z95810 Presence of automatic (implantable) cardiac defibrillator: Secondary | ICD-10-CM | POA: Diagnosis not present

## 2022-12-04 NOTE — Progress Notes (Signed)
EPIC Encounter for ICM Monitoring  Patient Name: Randy [name] is a 77 y.o. male Date: 12/04/2022 Primary Care Physican: Eden Emms, NP Primary Cardiologist: Ladona Ridgel Electrophysiologist: Ladona Ridgel 09/21/2022 Office Weight: 189 lbs   AT/AF Burden: 2.5%  (taking Xarelto)     Transmission reviewed.    Corvue thoracic impedance suggesting normal fluid levels.    Prescribed: Furosemide 20 mg 1 tablet by mouth at noon   Labs: 09/27/2022 Creatinine 1.66, BUN 22, Potassium 4.1, Sodium 135, GFR 39.69 A complete set of results can be found in Results Review.   Recommendations:  No changes.     Follow-up plan: ICM clinic phone appointment on 01/07/2023.  91 day device clinic remote transmission scheduled for 01/01/2023.    EP/Cardiology Office Visits:  12/21/2022 with Francis Dowse, PA (message sent to front desk to have pt update DPR).     Copy of ICM check sent to Dr. Ladona Ridgel.  3 month ICM trend: 12/04/2022.    12-14 Month ICM trend:     Karie Soda, RN 12/04/2022 3:15 PM

## 2022-12-06 ENCOUNTER — Telehealth: Payer: Self-pay

## 2022-12-06 NOTE — Progress Notes (Signed)
Care Management & Coordination Services Pharmacy Team  Reason for Encounter: Appointment Reminder  Contacted patient to confirm telephone appointment with Al Corpus , PharmD on 12/10/22 at 3:00. Spoke with family on 12/06/2022 spoke with son Karie Georges you have any problems getting your medications? No- Patient uses [facility] and has declined delivery the past month due to supply on hand.Patient forgets to take medications   What is your top health concern you would like to discuss at your upcoming visit?  Patient remains confused with taking medications  Have you seen any other providers since your last visit with PCP? No   Hospital visits:  None in previous 6 months   Star Rating Drugs:  Medication:  Last Fill: Day Supply [facility] Atorvastatin 80 09/28/22  30 Glipizide 10mg  09/28/22  30 Losartan 25mg  09/28/22  30   Care Gaps: Annual wellness visit in last year? No  If Diabetic: Last eye exam / retinopathy screening:2021 Last diabetic foot exam: UTD   Al Corpus, PharmD notified  Burt Knack, [facility] Clinical Pharmacy Assistant [phone]

## 2022-12-07 ENCOUNTER — Ambulatory Visit: Payer: Self-pay

## 2022-12-07 NOTE — Patient Outreach (Signed)
  Care Coordination   12/07/2022 Name: Randy [name] MRN: [id number] DOB: 06-24-1946   Care Coordination Outreach Attempts:  An unsuccessful telephone outreach was attempted today to offer the patient information about available care coordination services. HIPAA compliant voice message left with call back phone number.   Follow Up Plan:  Additional outreach attempts will be made to offer the patient care coordination information and services.   Encounter Outcome:  No Answer   Care Coordination Interventions:  No, not indicated    [name] [facility] Care Coordination [phone] direct line

## 2022-12-10 ENCOUNTER — Encounter: Payer: Medicare HMO | Admitting: Pharmacist

## 2022-12-10 ENCOUNTER — Telehealth: Payer: Self-pay | Admitting: Pharmacist

## 2022-12-10 NOTE — Progress Notes (Unsigned)
Care Management & Coordination Services Pharmacy Note  12/10/2022 Name:  Randy [name] MRN:  [id number] DOB:  10/25/1945  Summary: F/U visit -Per son, pt continues to struggle with medication non-compliance. Medications were recently consolidated into once-daily options so he can take all meds at once, but pt is still missing doses 3-4 days a week when son is no there to remind him -DM: A1c 13.6% (05/2022) in setting of noncompliance; pt has not started Rybelsus yet, it is not clear how effective this will be as compliance will continue to be an issue; pt/family have stated once weekly injections are not feasible either  Recommendations/Changes made from today's visit: -No med changes; encouraged continued family support to improve medication compliance  Follow up plan: -Health Concierge will call patient monthly for medication delivery coordination -Pharmacist follow up televisit scheduled for 3 months    Subjective: Randy [name] is an 77 y.o. year old male who is a primary patient of Ollen, Pacheco, NP.  The care coordination team was consulted for assistance with disease management and care coordination needs.    Engaged with patient by telephone for follow up visit.  Recent office visits: 11/08/22 NP Audria Nine OV: vision problems - advised not to drive. Referred for ophthalmology. Likely 2/2 uncontrolled DM  09/27/22 NP Matt [name] OV: A1c 12.4% in s/o noncompliance. Consider glipizide 20 mg/day  06/27/22 NP Matt [name] OV: annual - glucose 518. A1c 13.6%. LDL 155, TRIG 770. Start Trulicity (cost too high, changed to Rybelsus PAP)  Recent consult visits: 08/17/22 PA Francis Dowse (Cardiology): f/u - BP 96/60. No changes. 06/14/22 PA Francis Dowse (Cardiology): f/u - change Eliquis to Xarelto. Change Entresto to losartan. Change Carvedilol to metoprolol succinate (once daily versions)  Hospital visits: None in previous 6 months   Objective:  Lab Results  Component Value Date    CREATININE 1.66 (H) 09/27/2022   BUN 22 09/27/2022   GFR 39.69 (L) 09/27/2022   EGFR 42 (L) 06/14/2022   GFRNONAA 23 (L) 06/10/2019   GFRAA 26 (L) 06/10/2019   NA 135 09/27/2022   K 4.1 09/27/2022   CALCIUM 9.2 09/27/2022   CO2 25 09/27/2022   GLUCOSE 348 (H) 09/27/2022    Lab Results  Component Value Date/Time   HGBA1C 12.4 (A) 09/27/2022 11:13 AM   HGBA1C 13.6 (H) 06/27/2022 12:35 PM   HGBA1C 9.1 (A) 03/16/2022 11:12 AM   HGBA1C 8.9 (H) 03/10/2021 10:25 AM   HGBA1C 6.4 06/16/2018 09:19 AM   GFR 39.69 (L) 09/27/2022 11:32 AM   GFR 35.37 (L) 06/27/2022 12:35 PM   MICROALBUR 21.7 (H) 12/15/2021 10:51 AM   MICROALBUR 2.0 (H) 08/05/2019 09:36 AM    Last diabetic Eye exam:  Lab Results  Component Value Date/Time   HMDIABEYEEXA No Retinopathy 07/14/2019 12:00 AM    Last diabetic Foot exam: No results found for: "HMDIABFOOTEX"   Lab Results  Component Value Date   CHOL 290 (H) 06/27/2022   HDL 36.30 (L) 06/27/2022   LDLCALC 109 (H) 06/29/2021   LDLDIRECT 155.0 06/27/2022   TRIG (H) 06/27/2022    770.0 Triglyceride is over 400; calculations on Lipids are invalid.   CHOLHDL 8 06/27/2022       Latest Ref Rng & Units 09/27/2022   11:32 AM 08/17/2022    9:01 AM 06/27/2022   12:35 PM  Hepatic Function  Total Protein 6.0 - 8.3 g/dL 6.5  6.6  7.0   Albumin 3.5 - 5.2 g/dL 3.9  4.4  4.3   AST 0 - 37 U/L 11  11  10    ALT 0 - 53 U/L 7  6  8    Alk Phosphatase 39 - 117 U/L 68  79  86   Total Bilirubin 0.2 - 1.2 mg/dL 0.5  0.5  0.5   Bilirubin, Direct 0.00 - 0.40 mg/dL  1.61      Lab Results  Component Value Date/Time   TSH 1.300 08/17/2022 09:01 AM   TSH 1.17 06/27/2022 12:35 PM       Latest Ref Rng & Units 09/27/2022   11:32 AM 06/27/2022   12:35 PM 06/14/2022    9:20 AM  CBC  WBC 4.0 - 10.5 K/uL 7.0  7.4  7.7   Hemoglobin 13.0 - 17.0 g/dL 09.6  04.5  40.9   Hematocrit 39.0 - 52.0 % 35.9  38.2  38.5   Platelets 150.0 - 400.0 K/uL 180.0  197.0  187     Lab  Results  Component Value Date/Time   VD25OH 19.27 (L) 11/28/2021 01:04 PM   VD25OH 31.61 03/10/2021 10:25 AM   VITAMINB12 199 (L) 03/10/2021 10:25 AM    Clinical ASCVD: Yes  The ASCVD Risk score (Arnett DK, et al., 2019) failed to calculate for the following reasons:   The patient has a prior MI or stroke diagnosis    CHA2DS2/VAS Stroke Risk Points  Current as of 58 minutes ago     6 >= 2 Points: High Risk  1 - 1.99 Points: Medium Risk  0 Points: Low Risk    Last Change:      Points Metrics  1 Has Congestive Heart Failure:  Yes    Current as of 58 minutes ago  1 Has Vascular Disease:  Yes    Current as of 58 minutes ago  1 Has Hypertension:  Yes    Current as of 58 minutes ago  2 Age:  65    Current as of 58 minutes ago  1 Has Diabetes:  Yes    Current as of 58 minutes ago  0 Had Stroke:  No  Had TIA:  No  Had Thromboembolism:  No    Current as of 58 minutes ago  0 Male:  No    Current as of 58 minutes ago        06/28/2022    4:44 PM 12/13/2021    3:03 PM 03/10/2021    8:08 AM  Depression screen PHQ 2/9  Decreased Interest 0 0 0  Down, Depressed, Hopeless 0 0 0  PHQ - 2 Score 0 0 0     Social History   Tobacco Use  Smoking Status Former   Packs/day: 2.00   Years: 17.00   Additional pack years: 0.00   Total pack years: 34.00   Types: Cigarettes   Quit date: 07/30/1984   Years since quitting: 38.3  Smokeless Tobacco Never   BP Readings from Last 3 Encounters:  11/08/22 112/60  09/27/22 102/64  08/17/22 96/60   Pulse Readings from Last 3 Encounters:  11/08/22 64  09/27/22 64  08/17/22 76   Wt Readings from Last 3 Encounters:  11/08/22 183 lb 8 oz (83.2 kg)  09/27/22 183 lb 2 oz (83.1 kg)  08/17/22 181 lb 12.8 oz (82.5 kg)   BMI Readings from Last 3 Encounters:  11/08/22 27.10 kg/m  09/27/22 27.04 kg/m  08/17/22 26.85 kg/m    No Known Allergies  Medications Reviewed Today     Reviewed by  Vertis Kelch, CMA International aid/development worker)  on 11/08/22 at 1423  Med List Status: <None>   Medication Order Taking? Sig Documenting Provider Last Dose Status Informant  Accu-Chek Softclix Lancets lancets [id number] Yes USE AS DIRECTED  TO TEST BLOOD SUGAR ONE TIME DAILY  AND AS NEEDED Emi Belfast, FNP Taking Active   Alcohol Swabs (B-D SINGLE USE SWABS REGULAR) PADS [id number] Yes Use as instructed to clean area for glucose monitoring once daily and as needed.  Diagnosis:  E11.21  Non insulin-dependent Joaquim Nam, MD Taking Active Self  allopurinol (ZYLOPRIM) 100 MG tablet [id number] Yes TAKE 1/2 TABLET BY MOUTH AT Jeanella Anton, NP Taking Active   amiodarone (PACERONE) 200 MG tablet [id number] Yes Take 1 tablet (200 mg total) by mouth daily. Graciella Freer, PA-C Taking Active   atorvastatin (LIPITOR) 80 MG tablet [id number] Yes TAKE ONE TABLET BY MOUTH AT Vergie Living, Doylene Canning, MD Taking Active   carvedilol (COREG) 3.125 MG tablet [id number] Yes TAKE ONE TABLET BY MOUTH TWICE DAILY Marinus Maw, MD Taking Active   donepezil (ARICEPT) 5 MG tablet [id number] Yes TAKE ONE TABLET BY MOUTH AT Jeanella Anton, NP Taking Active   ezetimibe (ZETIA) 10 MG tablet [id number] Yes TAKE ONE TABLET BY MOUTH AT Jeanella Anton, NP Taking Active   finasteride (PROSCAR) 5 MG tablet [id number] Yes TAKE ONE TABLET BY MOUTH AT Jeanella Anton, NP Taking Active   furosemide (LASIX) 20 MG tablet [id number] Yes TAKE ONE TABLET BY MOUTH AT Jeanella Anton, NP Taking Active   gabapentin (NEURONTIN) 300 MG capsule [id number] Yes TAKE TWO CAPSULES BY MOUTH TWICE DAILY Eden Emms, NP Taking Active   glipiZIDE (GLUCOTROL XL) 10 MG 24 hr tablet [id number] Yes TAKE ONE TABLET BY MOUTH AT Jeanella Anton, NP Taking Active   losartan (COZAAR) 25 MG tablet [id number] Yes Take 1 tablet (25 mg total) by mouth daily. Marinus Maw, MD Taking Active   metoprolol succinate (TOPROL XL) 25 MG 24 hr tablet [id number] Yes Take 1 tablet (25 mg  total) by mouth daily. Sheilah Pigeon, PA-C Taking Active   Rivaroxaban (XARELTO) 15 MG TABS tablet [id number] Yes Take 1 tablet (15 mg total) by mouth daily with supper. Sheilah Pigeon, PA-C Taking Active   Semaglutide [facility]) 3 MG TABS [id number] Yes Take 3 mg by mouth daily. [employer], [name], NP Taking Active            Med Note Truddie Coco Sep 11, 2022  3:49 PM) Received in office 09/10/22  tamsulosin (FLOMAX) 0.4 MG CAPS capsule [id number] Yes TAKE ONE CAPSULE BY MOUTH AT Andrena Mews, [name], NP Taking Active             SDOH:  (Social Determinants of Health) assessments and interventions performed: No SDOH Interventions    Flowsheet Row Care Coordination from 07/20/2022 in [facility] Care Coordination from 06/28/2022 in [facility] Coord[facility] Chronic Care Management from 10/17/2021 in [facility] Chronic Care Management from 07/14/2021 in [facility] Chronic Care Management from 05/24/2021 in [facility] Clinical Support from 04/22/2017 in [facility] [place]  SDOH Interventions        Food Insecurity Interventions Intervention Not Indicated Intervention Not Indicated Intervention Not Indicated -- -- --  Housing Interventions Intervention Not Indicated Intervention Not Indicated -- Intervention Not Indicated -- --  Transportation Interventions Intervention Not Indicated Intervention Not Indicated -- -- Intervention Not Indicated --  Depression Interventions/Treatment  -- -- -- -- -- --  [referral to PCP ]  Financial Strain Interventions -- -- Intervention Not Indicated -- -- --  Physical Activity Interventions -- Intervention Not Indicated -- -- -- --       Medication Assistance:  Rybelsus - [employer] PAP 2024 approved  Medication Access: Within  the past 30 days, how often has patient missed a dose of medication? 3-4 days per week Is a pillbox or other method used to improve adherence? Yes  Factors that may affect medication adherence?  Memory impairment Are meds synced by current pharmacy? Yes  Are meds delivered by current pharmacy? Yes  Does patient experience delays in picking up medications due to transportation concerns? No   [facility] Reviewed: Is patient disadvantaged to use [facility]?: No  Current Rx insurance plan: [employer] Name and location of Current pharmacy:  [facility] - [place], [place] - [address] [place] [address] Phone: [phone] Fax: [phone]  [facility] services reviewed with patient today?: Yes  30-day packs (delivery 08/31/22): Furosemide 20 mg - 1 daily Allopurinol 100 mg - 1/2 tablet daily  Tamsulosin 0.4mg  - 1 daily  Finasteride 5 mg - 1 daily  Atorvastatin 80 mg - 1 tablet  Glipizide ER 10 mg - 1 daily   Zetia 10 mg- 1 tablet daily Donepezil 5mg  -1 tablet  Amiodarone 200mg  - 1 tablet Losartan 25mg  -take 1 tablet daily Metoprolol succinate 25mg  -take 1 tablet daily  Xarelto - samples from cardiology   Compliance/Adherence/Medication fill history: Care Gaps: AWV  Eye exam (due 06/2020)  Star-Rating Drugs: Atorvastatin - PDC 82% Glipizide - PDC 82% Losartan - PDC 80% Rybelsus - PAP   ASSESSMENT / PLAN   Hypertension / NSVT (BP goal <140/90) -Controlled - BP low-normal in clinic -Last ejection fraction: 25-30% (Date: 07/2017) -HF type: Systolic; NYHA Class: II-III -Current treatment: Metoprolol succinate 25 mg daily - Appropriate, Effective, Safe, Accessible Furosemide 20 mg daily - Appropriate, Effective, Safe, Accessible Losartan 25 mg daily - Appropriate, Effective, Safe, Accessible -Medications previously tried: carvedilol, Entresto -Denies hypotensive/hypertensive symptoms -Educated  on BP goals and benefits of medications for prevention of heart attack, stroke and kidney damage; -Counseled to monitor BP at home periodically -Recommend to continue current medication  Hyperlipidemia: (LDL goal < 70) -Uncontrolled - LDL 123 (11/2021) above goal and worsened since 06/2021, likely due to adherence issues -  compliance with all medications is an issue due to dementia -Hx NSTEMI -Current treatment: Atorvastatin 80 mg daily - Appropriate, Query Effective Ezetimibe 10 mg daily -Appropriate, Query Effective -Medications previously tried: n/a  -Educated on Cholesterol goals; Benefits of statin for ASCVD risk reduction; -Recommended to continue current medication; encouraged compliance with pill packs  Atrial Fibrillation (Goal: prevent stroke and major bleeding) -Query controlled - med compliance is an issue; concern that patient forgets bedtime doses so is missing 2nd dose of Eliquis often, would consider switching to Xarelto for once daily administration -CHADSVASC: 6 (age2, male CHF, HTN, CAD, DM) -Current treatment: Amiodarone 200 mg daily - Appropriate, Effective, Safe, Accessible Metoprolol succinate 25 mg daily - Appropriate, Effective, Safe, Accessible Xarelto 15 mg daily - Appropriate, Effective, Safe, Accessible -Medications previously tried: n/a -Counseled on increased risk of stroke due to Afib and  benefits of anticoagulation for stroke prevention; -Recommend to continue current medication;   Diabetes (A1c goal <8%) -Uncontrolled- A1c 13.6% (05/2022) much worse in s/o medication noncompliance; Rybelsus PAP was delivered to office this week, family will pick it up  -Current medications: Glipizide XL 10 mg daily AM - Appropriate, Effective, Safe, Accessible Rybelsus 3 mg daily (PAP) - not started yet -Medications previously tried: metformin  -Educated on A1c and blood sugar goals; -Counseled to check feet daily and get yearly eye exams -Reviewed options to improve  compliance - ideally weekly GLP-1 RA injection given by family member, but son declined this -Recommended to continue current medication;  Cognitive impairment (Goal: slow progression) -Query controlled - memory issues are impacting health conditions (DM is worse, blood thinner likely ineffective, etc) -Current treatment  Donepezil 5 mg daily HS - Appropriate, Effective, Safe, Accessible -Medications previously tried: n/a  -Recommended to continue current medication  Health Maintenance -Overall questionable medication adherence even with pill packs due to memory issues; per son, patient often forgets to take the packs even though the box is placed on his side table near his chair in living room -Encouraged son to monitor medication adherence as able    Al Corpus, PharmD, BCACP Clinical Pharmacist [facility] [phone]

## 2022-12-10 NOTE — Telephone Encounter (Signed)
Care Management & Coordination Services Outreach Note  12/10/2022 Name: Randy [name] MRN: [id number] DOB: 11/22/1945  Referred by: Eden Emms, NP  Patient had a phone appointment scheduled with clinical pharmacist today.  An unsuccessful telephone outreach was attempted today. The patient was referred to the pharmacist for assistance with medications, care management and care coordination.   Patient will NOT be penalized in any way for missing a Care Management & Coordination Services appointment. The no-show fee does not apply.  If possible, a message was left to return call to: [phone] or to [facility]  Al Corpus, PharmD, BCACP Clinical Pharmacist [facility] at [facility] [phone]

## 2022-12-18 ENCOUNTER — Telehealth: Payer: Self-pay

## 2022-12-18 NOTE — Patient Outreach (Signed)
  Care Coordination   Follow Up Visit Note   12/18/2022 Name: Randy [name] MRN: [id number] DOB: 01/25/1946  Randy [name] is a 77 y.o. year old male who sees [name], [name], NP for primary care. I  spoke with son, [name]  What matters to the patients health and wellness today?  Son states no change in patients status. Son states he has decided to hold off on LTC arrangements for patient at this time. Son states patient is scheduled to follow up with the cardiologist on 12/21/22.  Son denies patient having any increase in heart failure symptoms. Confirms no blood sugars being checked.     Goals Addressed             This Visit's Progress    Management/ education for health conditions       Interventions Today    Flowsheet Row Most Recent Value  Chronic Disease   Chronic disease during today's visit Diabetes, Congestive Heart Failure (CHF), Other  [mild cognitive impairment.]  General Interventions   General Interventions Discussed/Reviewed General Interventions Reviewed, Doctor Visits  [evaluation of current treatment plan for DM, HF, mild cognitivie impairment  and patients adherence to plan as established by provider. Assessed for HF symptoms/ worsening memory symptoms.]  Doctor Visits Discussed/Reviewed Doctor Visits Reviewed  [Discussed upcoming provider visits.Stressed importance of keeping follow up appointments with providers.]  Education Interventions   Education Provided Provided Education  Provided Verbal Education On Other  [reviewed heart failure symptoms.  Advised son to be aware of any changes with patient related to heart failure symptoms. Stressed safety due to patients mild cognitive impairement]  Pharmacy Interventions   Pharmacy Dicussed/Reviewed Pharmacy Topics Reviewed  [medications discussed and compliance encouraged.]                 SDOH assessments and interventions completed:  No     Care Coordination Interventions:  Yes, provided    Follow up plan: Follow up call scheduled for 01/23/23    Encounter Outcome:  Pt. Visit Completed   [name] RN,BSN,CCM [facility] Care Coordination [phone] direct line

## 2022-12-20 NOTE — Progress Notes (Signed)
Cardiology Office Note Date:  08/17/2022  Patient ID:  Randy [name], DOB 1945-08-13, MRN [id number] PCP:  Eden Emms, NP  Electrophysiologist: Dr. Ladona Ridgel    Chief Complaint:   planned f/u  History of Present Illness: Randy [name] is a 77 y.o. male with history of ICM, chronic CHF (systolic), VT, AFib, CAD (CABG 2019), CKD (III), HTN, HLD, DM  He comes in today to be seen for Dr. Ladona Ridgel, last seen by him June 2021, no VT/maintaining SR off amiodarone, no changes were made with class II symptoms  He saw [name] Dec 2022 he had been shocked by his ICD several weeks prior, no clear syncope or symptoms, amiodarone was resumed, his son reported he often missed his AM meds.  NO-showed to 2 scheduled f/u visits  Device remote ATPs felt to be inappropriate in review with EP in the office, for rapid AFlutter, called to come in.    I saw him 02/08/22 He comes accompanied by his son. The patient resides independently, is known to have some degree of dementia, no longer drives/goes out alone, admittedly he agrees with his son, would likely get lost. He feels like for the most part he gets his AM meds taken perhaps not 100%, but more often then not, his PM/afternoon medicines perhaps infrequent at least, perhaps rarely, says he just forgets, it is not an intentional non-compliance with medicines. His son says that when they find un-taken pills, and the pt can not recall if he took others or not and worry about over dosing pills, so they go ahead and skip. The patient denies any symptoms As far as the don knows, he has never complained to him about anything/any symptoms No reports of falls or faints No CP, SOB He does not exercise, lives a pretty sedentary life, but denies symptoms or difficulties with ADLs  He was in AFlutter that had just started that day, no further/new treated episodes Felt medication noncompliance driving/contributing perhaps difficulty with rhythm  management Discussed strategies.reminders to help with meds His son did later call and confirm amiodarone was in his pill pack Discussed need for gen cards for his other cardiac management. Questioned perhaps change to xarelto to try and avoid BID meds to help with compliance  03/21/22 further ATP tx for rapid AFlutter, presenting rhythm was AS/VS d/w Dr. Ladona Ridgel, no med changes  04/20/22, TRUE VT episodes (x2), both success with ATP, device RN d/w pt's son, no reported symptoms to him at least, revisited likely missed meds.  Looks like [facility] social worker has rechout unable to get the patient.  Now getting pill packs done, though still missing PM doses, RPH with his PMD reached out asking if any of his BID meds can be changed to daily to help with medication adherence. Also noted that he had been out of.not taking Entresto, perhaps since August though unclear.  Did not typically lower BPs last few visits Recs by them were to try  change carvedilol 3.125 mg to metoprolol succinate 12.5 mg daily  -change Eliquis to Xarelto 15 mg daily -Hold Entresto until BP can be monitored more consistently  I saw him 06/14/22 He is accompanied by his son. He admits to poor memory, they both estimate perhaps 50% medication compliance. He denies any kind of symptoms, does think he would remember symptoms if he had them, he has never mentioned any concerns or symptoms to his son. No CP, palpitations or cardiac awareness No dizzy spells, near syncope or  syncope No bleeding or signs of bleeding No further VT In effort to simplify his regime changes made Eliquis > Xarelto 15mg  daily Coreg > Toprol 25mg  daily Entresto > losartan 25mg  daily And urged compliance and familial assistance, PMD/team also trying to help, also rached out to our SW to see if any further assistance recourses were available to them. Discussed if more VT with medication compliance would need to consider cath.  Contact in December with  social worker, son was having trouble but in the process of trying to find placement with ongoing problems with him being able to take his medications, referral to [facility] care management as well.  The process of funding and placement is still ongoing with continued reports of medication noncompliance and confusion.  I saw him 08/17/22 He is accompanied by his son. No new symptoms or complaints His son continues to work with the social workers to get his medicaid and hopefully ALF set up. His medication compliance remains poor in general, as often as his son get get by is probably as often as he takes his medicines.  They have a calendar and his meds set as a system to take and check off his meds, but probably a 1/4 of the time forgets. He has never accidentally taken extra/doubled up No reports of any shocks, syncope, falls No no CP Some DOE with heavier activities none with usual ADLs or at rest No bleeding or signs of bleeding RV lead imp/thresholds on a slow rise, but OK No VT Again discussed strategies to try and get better medication compliance    TODAY He feels well, comes alone Reports that his son does help with his meds, but lives independently and denies any difficulties  No CP, palpitations Has some SOB/DOE at his baseline No rest SOB No near syncope or syncope. No bleeding or signs of bleeding No device therapies   Device information Abbott single chamber ICD implanted 05/20/2008 >> gen change and addition of RA lead 10/18/2016 + appropriate therapy  Amiodarone stopped 2021 after had run out and been off come months it seem (unclear)  Dec 2022 with VT treated w/HV tx. Amiodarone restarted Dec 2022   Has had inappropriate ATP as well for RVR  Past Medical History:  Diagnosis Date   AICD (automatic cardioverter/defibrillator) present 10/18/2016   "replaced 2009-[employer] device, Dr Ladona Ridgel"   Anemia    Arthritis    "back" (08/27/2017)   CAD (coronary artery disease)     CHF (congestive heart failure) (HCC)    CKD (chronic kidney disease) stage 3, GFR 30-59 ml/min (HCC)    [name]   Diverticulosis    Dyslipidemia    GERD (gastroesophageal reflux disease)    Hypertension    Neuromuscular disorder (HCC)    neuropathy- in hands   Osteoarthritis    hips, hands   Pacemaker    Shortness of breath    Sleep apnea    started a sleep study but didn't completed, states PCP told him he has sleep  apnea, doesn't use CPAP   Systolic dysfunction    Type 2 diabetes with nephropathy (HCC)    Vitamin D deficiency     Past Surgical History:  Procedure Laterality Date   ANTERIOR CERVICAL DECOMP/DISCECTOMY FUSION     BACK SURGERY     CARPAL TUNNEL RELEASE Left    CORONARY ARTERY BYPASS GRAFT N/A 09/02/2017   Procedure: CORONARY ARTERY BYPASS GRAFTING (CABG) x three , using left internal mammary artery and right  leg greater saphenous vein harvested endoscopically;  Surgeon: Alleen Borne, MD;  Location: [facility] OR;  Service: Open Heart Surgery;  Laterality: N/A;   CYST EXCISION     "back"   ICD IMPLANT N/A 10/18/2016   Procedure: ICD Implant- Upgrade to Dual Chamber;  Surgeon: Marinus Maw, MD;  Location: [facility] INVASIVE CV LAB;  Service: Cardiovascular;  Laterality: N/A;   INSERT / REPLACE / REMOVE PACEMAKER     JOINT REPLACEMENT     LEFT HEART CATH AND CORONARY ANGIOGRAPHY N/A 08/30/2017   Procedure: LEFT HEART CATH AND CORONARY ANGIOGRAPHY;  Surgeon: Swaziland, Peter M, MD;  Location: [facility] INVASIVE CV LAB;  Service: Cardiovascular;  Laterality: N/A;   LUMBAR LAMINECTOMY Left 07/17/2013   Procedure: MICRODISCECTOMY LUMBAR LAMINECTOMY;  Surgeon: Eldred Manges, MD;  Location: [facility] OR;  Service: Orthopedics;  Laterality: Left;  Left L4 Laminotomy, Lateral Recess Decompression, Cyst Excision   SHOULDER OPEN ROTATOR CUFF REPAIR Left    TEE WITHOUT CARDIOVERSION N/A 09/02/2017   Procedure: TRANSESOPHAGEAL ECHOCARDIOGRAM (TEE);  Surgeon: Alleen Borne, MD;  Location: [facility] OR;  Service:  Open Heart Surgery;  Laterality: N/A;   TOTAL HIP ARTHROPLASTY Left 09/17/2012   Procedure: Left TOTAL HIP ARTHROPLASTY ANTERIOR APPROACH;  Surgeon: Eldred Manges, MD;  Location: [facility] OR;  Service: Orthopedics;  Laterality: Left;    Current Outpatient Medications  Medication Sig Dispense Refill   Accu-Chek Softclix Lancets lancets USE AS DIRECTED  TO TEST BLOOD SUGAR ONE TIME DAILY  AND AS NEEDED 200 each 2   Alcohol Swabs (B-D SINGLE USE SWABS REGULAR) PADS Use as instructed to clean area for glucose monitoring once daily and as needed.  Diagnosis:  E11.21  Non insulin-dependent 100 each 3   allopurinol (ZYLOPRIM) 100 MG tablet TAKE 1/2 TABLET BY MOUTH EVERY MORNING 15 tablet 2   amiodarone (PACERONE) 200 MG tablet TAKE ONE TABLET BY MOUTH AT NOON 60 tablet 0   atorvastatin (LIPITOR) 80 MG tablet TAKE ONE TABLET BY MOUTH AT NOON 90 tablet 0   carvedilol (COREG) 3.125 MG tablet TAKE ONE TABLET BY MOUTH TWICE DAILY 180 tablet 3   donepezil (ARICEPT) 5 MG tablet TAKE ONE TABLET BY MOUTH EVERY EVENING 90 tablet 1   ezetimibe (ZETIA) 10 MG tablet TAKE ONE TABLET BY MOUTH EVERY MORNING 90 tablet 2   finasteride (PROSCAR) 5 MG tablet TAKE ONE TABLET BY MOUTH EVERY MORNING 30 tablet 5   furosemide (LASIX) 20 MG tablet TAKE ONE TABLET BY MOUTH EVERY MORNING 30 tablet 2   gabapentin (NEURONTIN) 300 MG capsule TAKE TWO CAPSULES BY MOUTH TWICE DAILY 120 capsule 2   glipiZIDE (GLUCOTROL XL) 10 MG 24 hr tablet TAKE ONE TABLET BY MOUTH AT NOON 90 tablet 1   losartan (COZAAR) 25 MG tablet Take 1 tablet (25 mg total) by mouth daily. 30 tablet 6   metoprolol succinate (TOPROL XL) 25 MG 24 hr tablet Take 1 tablet (25 mg total) by mouth daily. 30 tablet 6   Rivaroxaban (XARELTO) 15 MG TABS tablet Take 1 tablet (15 mg total) by mouth daily with supper. 30 tablet 6   Semaglutide (RYBELSUS) 3 MG TABS Take 3 mg by mouth daily. Via [employer]     tamsulosin (FLOMAX) 0.4 MG CAPS capsule TAKE ONE CAPSULE BY MOUTH EVERY  EVENING 30 capsule 5   No current facility-administered medications for this visit.    Allergies:   Patient has no known allergies.   Social History:  The patient  reports that he quit smoking about 38 years ago. His smoking use included cigarettes. He has a 34.00 pack-year smoking history. He has never used smokeless tobacco. He reports current alcohol use. He reports that he does not use drugs.   Family History:  The patient's family history includes Diabetes in his brother; Heart attack (age of onset: 53) in his father.  ROS:  Please see the history of present illness.    All other systems are reviewed and otherwise negative.   PHYSICAL EXAM:  VS:  BP 96/60 Comment: BP right arm 90/54  Pulse 76   Ht 5\' 9"  (1.753 m)   Wt 181 lb 12.8 oz (82.5 kg)   SpO2 97%   BMI 26.85 kg/m  BMI: Body mass index is 26.85 kg/m. Well nourished, well developed, in no acute distress, has an unkept appearance HEENT: normocephalic, atraumatic Neck: no JVD, carotid bruits or masses Cardiac: RRR; 1-2/6SM, no rubs, or gallops Lungs:  CTA b/l, no wheezing, rhonchi or rales Abd: soft, nontender MS: no deformity or atrophy Ext: no edema Skin: warm and dry, no rash Neuro:  No gross deficits appreciated Psych: euthymic mood, full affect  ICD site is stable, no tethering or discomfort   EKG:  not done today  Device interrogation done today by industry and reviewed by myself:  Battery and lead measurements are stable RV leaad is stable, has been a slow steady rise in both thrshold and impredance No noise No evidence of lead failure RV threshold is still good Impedance 2025 Ohms Auto threshold turned on, RV lead impedance upper limit increased to 3000  No VT AF burden low at 2.2%  08/30/2017: LHC Prox RCA lesion is 90% stenosed. Mid RCA lesion is 100% stenosed. Ost LM to Mid LM lesion is 85% stenosed. Ost LAD to Prox LAD lesion is 50% stenosed. Ost 1st Diag to 1st Diag lesion is 70%  stenosed. Ost Ramus to Ramus lesion is 75% stenosed. Ost Cx to Prox Cx lesion is 99% stenosed. Ost 1st Mrg lesion is 100% stenosed. LV end diastolic pressure is normal.   1. Severe 3 vessel and left main obstructive CAD 2. Normal LVEDP   08/27/2017: TTE Study Conclusions  - Left ventricle: The cavity size was normal. Systolic function was    severely reduced. The estimated ejection fraction was in the    range of 25% to 30%. Diffuse hypokinesis. Features are consistent    with a pseudonormal left ventricular filling pattern, with    concomitant abnormal relaxation and increased filling pressure    (grade 2 diastolic dysfunction). Doppler parameters are    consistent with high ventricular filling pressure.  - Aortic valve: Transvalvular velocity was within the normal range.    There was no stenosis. There was no regurgitation.  - Mitral valve: Transvalvular velocity was within the normal range.    There was no evidence for stenosis. There was trivial    regurgitation.  - Left atrium: The atrium was mildly dilated.  - Right ventricle: The cavity size was normal. Wall thickness was    normal. Systolic function was normal.  - Tricuspid valve: There was trivial regurgitation.  - Pulmonary arteries: Systolic pressure was within the normal    range. PA peak pressure: 18 mm Hg (S).    02/09/2008: TTE SUMMARY   -  The left ventricle was mildly dilated. Overall left ventricular         systolic function was mildly decreased. Left ventricular  ejection fraction was estimated to be 40 %. There was         akinesis of the entire inferoseptal wall. There was akinesis         of the entire inferior wall. There was akinesis of the         basal-mid posterior wall.   -  The aortic valve was mildly calcified. There was mildly reduced         aortic valve leaflet excursion.   Recent Labs: 06/14/2022: Magnesium 1.8 06/27/2022: ALT 8; BUN 23; Creatinine, Ser 1.83; Hemoglobin 12.9;  Platelets 197.0; Potassium 4.1; Sodium 132; TSH 1.17  06/27/2022: Cholesterol 290; Direct LDL 155.0; HDL 36.30; Total CHOL/HDL Ratio 8; Triglycerides 770.0 Triglyceride is over 400; calculations on Lipids are invalid.   CrCl cannot be calculated (Patient's most recent lab result is older than the maximum 21 days allowed.).   Wt Readings from Last 3 Encounters:  08/17/22 181 lb 12.8 oz (82.5 kg)  06/27/22 185 lb 4 oz (84 kg)  06/14/22 186 lb 3.2 oz (84.5 kg)     Other studies reviewed: Additional studies/records reviewed today include: summarized above  ASSESSMENT AND PLAN:  ICD Intact function programming changes as above at industry recommendations RV lead still functioning well Follow RV lead Back in 4 mo or so, sooner if needed  VT Resumed on amiodarone Dec 2022 Chronic amiodarone Labs today   Paroxysmal Afib, AFlutter CHA2DS2Vasc is 6, on Eliquis, appropriately dosed 2.2 % burden  CAD No reported anginal symptoms Son reports his PMD does his labs, and manages lipids  ICM Chronic CHF (systolic) No exam findings of volume OL CorVue looks OK  HTN Looks ok, tolerating current  Medication non-compliance Discussed/urged ongoing compliance    Disposition: remotes as usual, in clinic in 47mo, sooner if needed  Current medicines are reviewed at length with the patient today.  The patient did not have any concerns regarding medicines.  Norma Fredrickson, PA-C 08/17/2022 8:51 AM     [facility] [address] [place] [address] [phone] (office)  [phone] (fax)

## 2022-12-21 ENCOUNTER — Ambulatory Visit: Payer: Medicare HMO | Attending: Physician Assistant | Admitting: Physician Assistant

## 2022-12-21 ENCOUNTER — Encounter: Payer: Self-pay | Admitting: Physician Assistant

## 2022-12-21 VITALS — BP 108/58 | HR 73 | Ht 69.0 in | Wt 176.0 lb

## 2022-12-21 DIAGNOSIS — Z95810 Presence of automatic (implantable) cardiac defibrillator: Secondary | ICD-10-CM | POA: Diagnosis not present

## 2022-12-21 DIAGNOSIS — Z79899 Other long term (current) drug therapy: Secondary | ICD-10-CM

## 2022-12-21 DIAGNOSIS — I2510 Atherosclerotic heart disease of native coronary artery without angina pectoris: Secondary | ICD-10-CM | POA: Diagnosis not present

## 2022-12-21 DIAGNOSIS — I255 Ischemic cardiomyopathy: Secondary | ICD-10-CM

## 2022-12-21 DIAGNOSIS — I472 Ventricular tachycardia, unspecified: Secondary | ICD-10-CM | POA: Diagnosis not present

## 2022-12-21 DIAGNOSIS — I10 Essential (primary) hypertension: Secondary | ICD-10-CM | POA: Diagnosis not present

## 2022-12-21 DIAGNOSIS — I480 Paroxysmal atrial fibrillation: Secondary | ICD-10-CM | POA: Diagnosis not present

## 2022-12-21 DIAGNOSIS — I5022 Chronic systolic (congestive) heart failure: Secondary | ICD-10-CM

## 2022-12-21 NOTE — Patient Instructions (Signed)
Medication Instructions:   Your physician recommends that you continue on your current medications as directed. Please refer to the Current Medication list given to you today.  *If you need a refill on your cardiac medications before your next appointment, please call your pharmacy*   Lab Work:  CMET CBC AND TSH TODAY    If you have labs (blood work) drawn today and your tests are completely normal, you will receive your results only by: MyChart Message (if you have MyChart) OR A paper copy in the mail If you have any lab test that is abnormal or we need to change your treatment, we will call you to review the results.   Testing/Procedures: NONE ORDERED  TODAY     Follow-Up: At [facility], you and your health needs are our priority.  As part of our continuing mission to provide you with exceptional heart care, we have created designated Provider Care Teams.  These Care Teams include your primary Cardiologist (physician) and Advanced Practice Providers (APPs -  Physician Assistants and Nurse Practitioners) who all work together to provide you with the care you need, when you need it.  We recommend signing up for the patient portal called "MyChart".  Sign up information is provided on this After Visit Summary.  MyChart is used to connect with patients for Virtual Visits (Telemedicine).  Patients are able to view lab/test results, encounter notes, upcoming appointments, etc.  Non-urgent messages can be sent to your provider as well.   To learn more about what you can do with MyChart, go to [email or web address].    Your next appointment:   4 month(s)  Provider:   Lewayne Bunting, MD or Francis Dowse, PA-C    Other Instructions

## 2022-12-22 LAB — CBC
Hematocrit: 35 % — ABNORMAL LOW (ref 37.5–51.0)
Hemoglobin: 11.2 g/dL — ABNORMAL LOW (ref 13.0–17.7)
MCH: 28 pg (ref 26.6–33.0)
MCHC: 32 g/dL (ref 31.5–35.7)
MCV: 88 fL (ref 79–97)
Platelets: 208 10*3/uL (ref 150–450)
RBC: 4 x10E6/uL — ABNORMAL LOW (ref 4.14–5.80)
RDW: 12.9 % (ref 11.6–15.4)
WBC: 6.1 10*3/uL (ref 3.4–10.8)

## 2022-12-22 LAB — COMPREHENSIVE METABOLIC PANEL WITH GFR
ALT: 9 [IU]/L (ref 0–44)
AST: 10 [IU]/L (ref 0–40)
Albumin/Globulin Ratio: 1.8 (ref 1.2–2.2)
Albumin: 4.2 g/dL (ref 3.8–4.8)
Alkaline Phosphatase: 76 [IU]/L (ref 44–121)
BUN/Creatinine Ratio: 17 (ref 10–24)
BUN: 30 mg/dL — ABNORMAL HIGH (ref 8–27)
Bilirubin Total: 0.3 mg/dL (ref 0.0–1.2)
CO2: 21 mmol/L (ref 20–29)
Calcium: 9.2 mg/dL (ref 8.6–10.2)
Chloride: 103 mmol/L (ref 96–106)
Creatinine, Ser: 1.81 mg/dL — ABNORMAL HIGH (ref 0.76–1.27)
Globulin, Total: 2.4 g/dL (ref 1.5–4.5)
Glucose: 260 mg/dL — ABNORMAL HIGH (ref 70–99)
Potassium: 4.3 mmol/L (ref 3.5–5.2)
Sodium: 139 mmol/L (ref 134–144)
Total Protein: 6.6 g/dL (ref 6.0–8.5)
eGFR: 38 mL/min/{1.73_m2} — ABNORMAL LOW

## 2022-12-22 LAB — TSH: TSH: 0.472 u[IU]/mL (ref 0.450–4.500)

## 2023-01-01 ENCOUNTER — Ambulatory Visit (INDEPENDENT_AMBULATORY_CARE_PROVIDER_SITE_OTHER): Payer: Medicare HMO

## 2023-01-01 DIAGNOSIS — I5022 Chronic systolic (congestive) heart failure: Secondary | ICD-10-CM | POA: Diagnosis not present

## 2023-01-02 LAB — CUP PACEART REMOTE DEVICE CHECK
Battery Remaining Longevity: 38 mo
Battery Remaining Percentage: 38 %
Battery Voltage: 2.87 V
Brady Statistic AP VP Percent: 1.6 %
Brady Statistic AP VS Percent: 3.5 %
Brady Statistic AS VP Percent: 9.5 %
Brady Statistic AS VS Percent: 81 %
Brady Statistic RA Percent Paced: 1 %
Brady Statistic RV Percent Paced: 11 %
Date Time Interrogation Session: 20240605053957
HighPow Impedance: 74 Ohm
HighPow Impedance: 74 Ohm
Implantable Lead Connection Status: 753985
Implantable Lead Connection Status: 753985
Implantable Lead Implant Date: 20091022
Implantable Lead Implant Date: 20180322
Implantable Lead Location: 753859
Implantable Lead Location: 753860
Implantable Lead Model: 7121
Implantable Pulse Generator Implant Date: 20180322
Lead Channel Impedance Value: 1975 Ohm
Lead Channel Impedance Value: 290 Ohm
Lead Channel Pacing Threshold Amplitude: 0.75 V
Lead Channel Pacing Threshold Amplitude: 1.875 V
Lead Channel Pacing Threshold Pulse Width: 0.5 ms
Lead Channel Pacing Threshold Pulse Width: 0.5 ms
Lead Channel Sensing Intrinsic Amplitude: 1.6 mV
Lead Channel Sensing Intrinsic Amplitude: 12 mV
Lead Channel Setting Pacing Amplitude: 2 V
Lead Channel Setting Pacing Amplitude: 2.125
Lead Channel Setting Pacing Pulse Width: 0.5 ms
Lead Channel Setting Sensing Sensitivity: 0.5 mV
Pulse Gen Serial Number: 7413245

## 2023-01-07 ENCOUNTER — Ambulatory Visit: Payer: Medicare HMO | Attending: Internal Medicine

## 2023-01-07 DIAGNOSIS — I5022 Chronic systolic (congestive) heart failure: Secondary | ICD-10-CM | POA: Diagnosis not present

## 2023-01-07 DIAGNOSIS — Z95810 Presence of automatic (implantable) cardiac defibrillator: Secondary | ICD-10-CM

## 2023-01-09 NOTE — Progress Notes (Signed)
EPIC Encounter for ICM Monitoring  Patient Name: Randy [name] is a 77 y.o. male Date: 01/09/2023 Primary Care Physican: Eden Emms, NP Primary Cardiologist: Ladona Ridgel Electrophysiologist: Ladona Ridgel 09/21/2022 Office Weight: 189 lbs 12/21/2022 Office Weight: 176 lbs   AT/AF Burden: <1%  (taking Xarelto)     Transmission reviewed.    Corvue thoracic impedance suggesting normal fluid levels with the exception of possible fluid accumulation from 5/22-6/3.   Prescribed: Furosemide 20 mg 1 tablet by mouth at noon   Labs: 09/27/2022 Creatinine 1.66, BUN 22, Potassium 4.1, Sodium 135, GFR 39.69 A complete set of results can be found in Results Review.   Recommendations:  No changes.     Follow-up plan: ICM clinic phone appointment on 02/11/2023.  91 day device clinic remote transmission scheduled for 04/02/2023.    EP/Cardiology Office Visits:  05/13/2023 with Dr Ladona Ridgel.     Copy of ICM check sent to Dr. Ladona Ridgel.  3 month ICM trend: 01/07/2023.    12-14 Month ICM trend:     Karie Soda, RN 01/09/2023 9:43 AM

## 2023-01-22 ENCOUNTER — Other Ambulatory Visit: Payer: Self-pay | Admitting: *Deleted

## 2023-01-22 DIAGNOSIS — Z79899 Other long term (current) drug therapy: Secondary | ICD-10-CM

## 2023-01-23 ENCOUNTER — Ambulatory Visit: Payer: Self-pay

## 2023-01-23 NOTE — Patient Outreach (Signed)
  Care Coordination   Follow Up Visit Note   01/23/2023 Name: AARIN BLUETT MRN: [id number] DOB: 17-Aug-1945  [name] is a 77 y.o. year old male who sees [name], [name], NP for primary care. I  spoke  with son Kellon Chalk today.  What matters to the patients health and wellness today?  Son states patients memory issues continues to be about the same. He states patient continues to be noncompliant with medications.  He states patient takes them sometimes.  Confirmed patient had follow up with cardiologist last month. Denies any changes to treatment plan.   Son states he has not started looking into long term care options for patient again.    Goals Addressed             This Visit's Progress    Management/ education for health conditions       Interventions Today    Flowsheet Row Most Recent Value  Chronic Disease   Chronic disease during today's visit Diabetes, Congestive Heart Failure (CHF), Other  [mild cognitive impairment]  General Interventions   General Interventions Discussed/Reviewed General Interventions Reviewed, Labs, Doctor Visits  [evaluation of current treatment plan for health conditions and patient adherence to plan as established by provider. Assessed for heart failure symptoms.]  Labs --  [reviewed/ discussed recent CBD, CMP lab.  Provided appointment schedule date for next lab follow up]  Doctor Visits Discussed/Reviewed Doctor Visits Reviewed  Annabell Sabal upcoming provider visit appointments.  Stressed importance of keeping follow up visits with providers.]  Education Interventions   Education Provided Provided Education  [Advised patient to contact RNCM or social worker when ready to resume long term care placement for patient.  Confirmed son has contact phone number for [facility] and social worker.]  Nutrition Interventions   Nutrition Discussed/Reviewed Nutrition Reviewed  Pharmacy Interventions   Pharmacy Dicussed/Reviewed Pharmacy Topics Reviewed  [name]  for and stressed need for medication compliance.]  Safety Interventions   Safety Discussed/Reviewed Fall Risk  [assessed for falls.]                 SDOH assessments and interventions completed:  No     Care Coordination Interventions:  Yes, provided   Follow up plan: Follow up call scheduled for 03/19/23    Encounter Outcome:  Pt. Visit Completed   [name] RN,BSN,CCM [facility] Care Coordination [phone] direct line

## 2023-01-23 NOTE — Patient Instructions (Signed)
Visit Information  Thank you for taking time to visit with me today. Please don't hesitate to contact me if I can be of assistance to you.   Following are the goals we discussed today:   Goals Addressed             This Visit's Progress    Management/ education for health conditions       Interventions Today    Flowsheet Row Most Recent Value  Chronic Disease   Chronic disease during today's visit Diabetes, Congestive Heart Failure (CHF), Other  [mild cognitive impairment]  General Interventions   General Interventions Discussed/Reviewed General Interventions Reviewed, Labs, Doctor Visits  [evaluation of current treatment plan for health conditions and patient adherence to plan as established by provider. Assessed for heart failure symptoms.]  Labs --  [reviewed/ discussed recent CBD, CMP lab.  Provided appointment schedule date for next lab follow up]  Doctor Visits Discussed/Reviewed Doctor Visits Reviewed  Annabell Sabal upcoming provider visit appointments.  Stressed importance of keeping follow up visits with providers.]  Education Interventions   Education Provided Provided Education  [Advised patient to contact RNCM or social worker when ready to resume long term care placement for patient.  Confirmed son has contact phone number for [facility] and social worker.]  Nutrition Interventions   Nutrition Discussed/Reviewed Nutrition Reviewed  Pharmacy Interventions   Pharmacy Dicussed/Reviewed Pharmacy Topics Reviewed  [name] for and stressed need for medication compliance.]  Safety Interventions   Safety Discussed/Reviewed Fall Risk  [assessed for falls.]                 Our next appointment is by telephone on 03/19/23 at 10 am  Please call the care guide team at [phone] if you need to cancel or reschedule your appointment.   If you are experiencing a Mental Health or Behavioral Health Crisis or need someone to talk to, please call the Suicide and Crisis Lifeline:  988 call 1-800-273-TALK (toll free, 24 hour hotline)  The patient verbalized understanding of instructions, educational materials, and care plan provided today and agreed to receive a mailed copy of patient instructions, educational materials, and care plan.   [name] RN,BSN,CCM [facility] Care Coordination [phone] direct line

## 2023-01-28 NOTE — Progress Notes (Signed)
Remote ICD transmission.   

## 2023-02-07 ENCOUNTER — Ambulatory Visit: Payer: Medicare HMO | Attending: Cardiovascular Disease

## 2023-02-07 DIAGNOSIS — Z79899 Other long term (current) drug therapy: Secondary | ICD-10-CM

## 2023-02-07 LAB — BASIC METABOLIC PANEL
BUN/Creatinine Ratio: 12 (ref 10–24)
BUN: 18 mg/dL (ref 8–27)
CO2: 22 mmol/L (ref 20–29)
Calcium: 9.5 mg/dL (ref 8.6–10.2)
Chloride: 101 mmol/L (ref 96–106)
Creatinine, Ser: 1.53 mg/dL — ABNORMAL HIGH (ref 0.76–1.27)
Glucose: 227 mg/dL — ABNORMAL HIGH (ref 70–99)
Potassium: 4.1 mmol/L (ref 3.5–5.2)
Sodium: 140 mmol/L (ref 134–144)
eGFR: 47 mL/min/{1.73_m2} — ABNORMAL LOW (ref >59)

## 2023-02-11 ENCOUNTER — Ambulatory Visit: Payer: Medicare HMO | Attending: Internal Medicine

## 2023-02-11 DIAGNOSIS — Z95810 Presence of automatic (implantable) cardiac defibrillator: Secondary | ICD-10-CM

## 2023-02-11 DIAGNOSIS — I5022 Chronic systolic (congestive) heart failure: Secondary | ICD-10-CM | POA: Diagnosis not present

## 2023-02-17 NOTE — Progress Notes (Signed)
EPIC Encounter for ICM Monitoring  Patient Name: Randy [name] is a 77 y.o. male Date: 02/17/2023 Primary Care Physican: Eden Emms, NP Primary Cardiologist: Ladona Ridgel Electrophysiologist: Ladona Ridgel 09/21/2022 Office Weight: 189 lbs 12/21/2022 Office Weight: 176 lbs   AT/AF Burden: 5.5%  (taking Xarelto)     Transmission reviewed.    Corvue thoracic impedance suggesting normal fluid levels with the exception of possible fluid accumulation from 6/27-7/12.   Prescribed: Furosemide 20 mg 1 tablet by mouth at noon   Labs: 02/07/2023 Creatinine 1.53, BUN 18, Potassium 4.1, Sodium 140, GFR 47 A complete set of results can be found in Results Review.   Recommendations:  No changes.     Follow-up plan: ICM clinic phone appointment on 03/18/2023.  91 day device clinic remote transmission scheduled for 04/02/2023.    EP/Cardiology Office Visits:  05/13/2023 with Dr Ladona Ridgel.     Copy of ICM check sent to Dr. Ladona Ridgel.  3 month ICM trend: 02/14/2023.    12-14 Month ICM trend:     Karie Soda, RN 02/17/2023 9:51 AM

## 2023-03-19 ENCOUNTER — Ambulatory Visit: Payer: Self-pay

## 2023-03-19 ENCOUNTER — Encounter: Payer: Self-pay | Admitting: *Deleted

## 2023-03-19 NOTE — Patient Outreach (Signed)
  Care Coordination   Follow Up Visit Note   03/19/2023 Name: Randy [name] MRN: [id number] DOB: 05-22-1946  Randy [name] is a 77 y.o. year old male who sees [name], [name], NP for primary care. I  spoke with son, Randy [name]  What matters to the patients health and wellness today?  Son states patient continues to have cognitive decline. He states patient is not caring for himself well.  He states patient's hygiene is not good, he is not managing his home, and not taking his medications. Son states patient has someone that takes him to get breakfast every morning.  Son states he recently saw patient and took him out to eat.  He states patient also will go over to his neighbors house on occasion.  Son states he attempted to apply to [facility] for placement assistance for patient. He states he was told patient made $40 over qualification level. Son states he is not going to be patients caregiver.  He states patient was not there for him and did not raise him.  Son states he doesn't have a problem with helping patient on occasion but will not be responsible for overall care.    Goals Addressed             This Visit's Progress    Management/ education for health conditions       Interventions Today    Flowsheet Row Most Recent Value  Chronic Disease   Chronic disease during today's visit Other, Diabetes  [mild cognitive impairment]  General Interventions   General Interventions Discussed/Reviewed General Interventions Reviewed, Doctor Visits  [evaluation of current treatment plan for DM/ Mild cognitive decline and patients adherence to plan as established by provider.]  Doctor Visits Discussed/Reviewed Doctor Visits Reviewed  Annabell Sabal upcoming provider visits. Discussed need for patient to have follow up visit with primary care provider to report ongoing cognitive decline]  Education Interventions   Education Provided Provided Education  Provided Verbal Education On Other  [Encouraged  son to call RN case manager or social worker as needed.]  Mental Health Interventions   Mental Health Discussed/Reviewed Mental Health Reviewed, Ophelia Shoulder for patients current cognitive status. Provided son contact phone number for APS]  Pharmacy Interventions   Pharmacy Dicussed/Reviewed Pharmacy Topics Reviewed  [name] if patient continues to not take medications.]  Safety Interventions   Safety Discussed/Reviewed Fall Risk  [Assessed for falls.]                 SDOH assessments and interventions completed:  No     Care Coordination Interventions:  Yes, provided   Follow up plan: Follow up call scheduled for 03/27/23    Encounter Outcome:  Pt. Visit Completed   [name] RN,BSN,CCM [facility] Care Coordination [phone] direct line

## 2023-03-19 NOTE — Patient Outreach (Signed)
  Care Coordination   colla  Visit Note   03/19/2023 Name: Randy [name] MRN: [id number] DOB: 1945-09-30  Randy [name] is a 77 y.o. year old male who sees Randy [name], Randy Churn, NP for primary care. I  collaborated with RNCM regarding patient's status and options for care  What matters to the patients health and wellness today? Community supports    Goals Addressed             This Visit's Progress    care coordination activities       Interventions Today    Flowsheet Row Most Recent Value  Chronic Disease   Chronic disease during today's visit Other, Diabetes  [Mild cognitive impairment]  General Interventions   General Interventions Discussed/Reviewed General Interventions Discussed, General Interventions Reviewed, Communication with  Communication with RN  [collaborated with RN regarding patient's status and options for care-will continue to consider out of home placement due to limited support]              SDOH assessments and interventions completed:  No     Care Coordination Interventions:  Yes, provided   Follow up plan: No further intervention required.   Encounter Outcome:  Pt. Visit Completed

## 2023-03-21 ENCOUNTER — Ambulatory Visit: Payer: Medicare HMO | Attending: Internal Medicine

## 2023-03-21 ENCOUNTER — Telehealth: Payer: Self-pay | Admitting: *Deleted

## 2023-03-21 ENCOUNTER — Telehealth: Payer: Self-pay

## 2023-03-21 DIAGNOSIS — Z95810 Presence of automatic (implantable) cardiac defibrillator: Secondary | ICD-10-CM | POA: Diagnosis not present

## 2023-03-21 DIAGNOSIS — I5022 Chronic systolic (congestive) heart failure: Secondary | ICD-10-CM

## 2023-03-21 NOTE — Patient Outreach (Signed)
  Care Coordination   03/21/2023 Name: Randy [name] MRN: [id number] DOB: 1946/07/07   Care Coordination Outreach Attempts:  An unsuccessful telephone outreach was attempted today to offer the patient information about available care coordination services.  Follow Up Plan:  Additional outreach attempts will be made to offer the patient care coordination information and services.   Encounter Outcome:  Pt. Visit Completed   Care Coordination Interventions:  No, not indicated    Jaevian Shean, LCSW Clinical Social Worker  [facility] Care Management [phone]

## 2023-03-21 NOTE — Patient Outreach (Signed)
  Care Coordination   Multidisciplinary Case Review Note    03/22/2023 Name: MIKHI MATHY MRN: [id number] DOB: Dec 12, 1945  [name] is a 77 y.o. year old male who sees [name], [name], NP for primary care.  The  multidisciplinary care team met today to review patient care needs and barriers.     Goals Addressed             This Visit's Progress    Care coordination activities       Interventions Today    Flowsheet Row Most Recent Value  General Interventions   General Interventions Discussed/Reviewed General Interventions Reviewed  [Multidisciplinary care team met to discuss patient needs and barriers to care]              SDOH assessments and interventions completed:  No     Care Coordination Interventions Activated:  Yes   Care Coordination Interventions:  Yes, provided   Follow up plan:  as previously scheduled    Multidisciplinary Team Attendees:   Kemper Durie, RN Gean Maidens, RN [place], LCSW Riti Rollyson RN [place], [place]

## 2023-03-21 NOTE — Progress Notes (Signed)
EPIC Encounter for ICM Monitoring  Patient Name: Randy [name] is a 77 y.o. male Date: 03/21/2023 Primary Care Physican: Eden Emms, NP Primary Cardiologist: Ladona Ridgel Electrophysiologist: Ladona Ridgel 09/21/2022 Office Weight: 189 lbs 12/21/2022 Office Weight: 176 lbs   AT/AF Burden: 5.5%  (taking Xarelto)     Transmission reviewed.    Corvue thoracic impedance suggesting normal fluid levels within the last month.   Prescribed: Furosemide 20 mg 1 tablet by mouth at noon   Labs: 02/07/2023 Creatinine 1.53, BUN 18, Potassium 4.1, Sodium 140, GFR 47 A complete set of results can be found in Results Review.   Recommendations:  No changes.     Follow-up plan: ICM clinic phone appointment on 04/22/2023.  91 day device clinic remote transmission scheduled for 04/02/2023.    EP/Cardiology Office Visits:  05/13/2023 with Dr Ladona Ridgel.     Copy of ICM check sent to Dr. Ladona Ridgel.   3 month ICM trend: 03/20/2023.    12-14 Month ICM trend:     Karie Soda, RN 03/21/2023 3:23 PM

## 2023-03-27 ENCOUNTER — Ambulatory Visit: Payer: Self-pay

## 2023-03-27 NOTE — Patient Outreach (Signed)
  Care Coordination   03/27/2023 Name: Randy [name] MRN: [id number] DOB: 1946-06-22   Care Coordination Outreach Attempts:  An unsuccessful telephone outreach was attempted for a scheduled appointment today. HIPAA compliant voice message left with call back phone number.   Follow Up Plan:  Additional outreach attempts will be made to offer the patient care coordination information and services.   Encounter Outcome:  No Answer   Care Coordination Interventions:  No, not indicated    [name] [facility] Care Coordination [phone] direct line

## 2023-03-28 ENCOUNTER — Telehealth: Payer: Self-pay

## 2023-03-28 NOTE — Patient Outreach (Signed)
  Care Coordination   Follow Up Visit Note   03/28/2023 Name: Randy [name] MRN: [id number] DOB: 06-19-1946  Randy [name] is a 77 y.o. year old male who sees [name], [name], NP for primary care. I  spoke with patients son, Randy [name].   What matters to the patients health and wellness today?  Son confirmed patient is receiving blister pack for medication administration.  He states patient is still not able to always remember to take his medications as prescribed.      Goals Addressed             This Visit's Progress    Management/ education for health conditions       Interventions Today    Flowsheet Row Most Recent Value  Chronic Disease   Chronic disease during today's visit Other  [mild cognitive impairment]  Education Interventions   Education Provided --  [Provided adult protective service contact phone number to son at his request.]  Pharmacy Interventions   Pharmacy Dicussed/Reviewed Pharmacy Topics Reviewed  [re-discussed medication compliance with son.  Confirmed patient is receiving blister packs for easier medication management.]                 SDOH assessments and interventions completed:  No     Care Coordination Interventions:  Yes, provided   Follow up plan: Follow up call scheduled for as previously scheduled    Encounter Outcome:  Pt. Visit Completed   [name] RN,BSN,CCM [facility] Care Coordination [phone] direct line

## 2023-04-02 ENCOUNTER — Ambulatory Visit (INDEPENDENT_AMBULATORY_CARE_PROVIDER_SITE_OTHER): Payer: Medicare PPO

## 2023-04-02 DIAGNOSIS — I255 Ischemic cardiomyopathy: Secondary | ICD-10-CM | POA: Diagnosis not present

## 2023-04-03 LAB — CUP PACEART REMOTE DEVICE CHECK
Battery Remaining Longevity: 35 mo
Battery Remaining Percentage: 36 %
Battery Voltage: 2.87 V
Brady Statistic AP VP Percent: 1.4 %
Brady Statistic AP VS Percent: 4.8 %
Brady Statistic AS VP Percent: 7.2 %
Brady Statistic AS VS Percent: 81 %
Brady Statistic RA Percent Paced: 1 %
Brady Statistic RV Percent Paced: 9.1 %
Date Time Interrogation Session: 20240903181139
HighPow Impedance: 72 Ohm
HighPow Impedance: 72 Ohm
Implantable Lead Connection Status: 753985
Implantable Lead Connection Status: 753985
Implantable Lead Implant Date: 20091022
Implantable Lead Implant Date: 20180322
Implantable Lead Location: 753859
Implantable Lead Location: 753860
Implantable Lead Model: 7121
Implantable Pulse Generator Implant Date: 20180322
Lead Channel Impedance Value: 2000 Ohm
Lead Channel Impedance Value: 300 Ohm
Lead Channel Pacing Threshold Amplitude: 0.75 V
Lead Channel Pacing Threshold Amplitude: 2.25 V
Lead Channel Pacing Threshold Pulse Width: 0.5 ms
Lead Channel Pacing Threshold Pulse Width: 0.5 ms
Lead Channel Sensing Intrinsic Amplitude: 12 mV
Lead Channel Sensing Intrinsic Amplitude: 2.3 mV
Lead Channel Setting Pacing Amplitude: 2 V
Lead Channel Setting Pacing Amplitude: 2.5 V
Lead Channel Setting Pacing Pulse Width: 0.5 ms
Lead Channel Setting Sensing Sensitivity: 0.5 mV
Pulse Gen Serial Number: 7413245

## 2023-04-15 NOTE — Progress Notes (Signed)
Remote ICD transmission.

## 2023-04-18 ENCOUNTER — Telehealth: Payer: Self-pay

## 2023-04-18 NOTE — Telephone Encounter (Signed)
Following alert received from [employer] received for VT detected and ATP failed to covert, the VT/VF episode was successfully converted after one ICD shock.  Spoke to patients son - Chanetta Marshall. States patient has short term memory loss and unable to remember if he was shocked or had symptoms during that time.   Son states he will call his father around lunch time to check on him. States patient does not have anyone local who is able to assist patient with medication compliance. Patient does have someone local who takes him to breakfast each morning and sees patient. Chanetta Marshall stated he is working w/ Adult Dietitian with [facility] trying to get patient placed in a facility who can help with ADL's and medication compliance. States the process started this week but unsure how long it may take.  Patient does not drive. Requested an update on patient after he speaks to Mr. Majka. Chanetta Marshall states he will return call.

## 2023-04-22 ENCOUNTER — Ambulatory Visit: Payer: Medicare HMO | Attending: Internal Medicine

## 2023-04-22 DIAGNOSIS — Z95810 Presence of automatic (implantable) cardiac defibrillator: Secondary | ICD-10-CM

## 2023-04-22 DIAGNOSIS — I5022 Chronic systolic (congestive) heart failure: Secondary | ICD-10-CM

## 2023-04-23 NOTE — Telephone Encounter (Signed)
Attempted to contact Randy [name] - pts son. No answer, unable to leave VM.

## 2023-04-26 NOTE — Telephone Encounter (Signed)
Spoke w/ son to ensure that everything was addressed w/ patient. No concerns from son. Given er precautions w/ further therapy. Will forward to care coordination to help w/ navigating services needed.

## 2023-04-26 NOTE — Telephone Encounter (Signed)
LM on Sons phone to call back.

## 2023-04-30 NOTE — Progress Notes (Signed)
EPIC Encounter for ICM Monitoring  Patient Name: Randy [name] is a 77 y.o. male Date: 04/30/2023 Primary Care Physican: Eden Emms, NP Primary Cardiologist: Ladona Ridgel Electrophysiologist: Ladona Ridgel 09/21/2022 Office Weight: 189 lbs 12/21/2022 Office Weight: 176 lbs   AT/AF Burden: 6.4%  (taking Xarelto)     Transmission reviewed.    Corvue thoracic impedance suggesting normal fluid levels within the last month.  ATP delivered 9/19 and addressed by Device Clinic Triage.   Prescribed: Furosemide 20 mg 1 tablet by mouth at noon   Labs: 02/07/2023 Creatinine 1.53, BUN 18, Potassium 4.1, Sodium 140, GFR 47 A complete set of results can be found in Results Review.   Recommendations:  No changes.     Follow-up plan: ICM clinic phone appointment on 05/27/2023.  91 day device clinic remote transmission scheduled for 07/02/2023.    EP/Cardiology Office Visits:  05/13/2023 with Dr Ladona Ridgel.     Copy of ICM check sent to Dr. Ladona Ridgel.   3 month ICM trend: 04/22/2023.    12-14 Month ICM trend:     Karie Soda, RN 04/30/2023 9:46 AM

## 2023-05-13 ENCOUNTER — Ambulatory Visit: Payer: Medicare HMO | Attending: Internal Medicine | Admitting: Internal Medicine

## 2023-05-14 ENCOUNTER — Encounter: Payer: Self-pay | Admitting: Internal Medicine

## 2023-05-14 DIAGNOSIS — H547 Unspecified visual loss: Secondary | ICD-10-CM | POA: Diagnosis not present

## 2023-05-14 DIAGNOSIS — M109 Gout, unspecified: Secondary | ICD-10-CM | POA: Diagnosis not present

## 2023-05-14 DIAGNOSIS — R030 Elevated blood-pressure reading, without diagnosis of hypertension: Secondary | ICD-10-CM | POA: Diagnosis not present

## 2023-05-14 DIAGNOSIS — I509 Heart failure, unspecified: Secondary | ICD-10-CM | POA: Diagnosis not present

## 2023-05-14 DIAGNOSIS — Z008 Encounter for other general examination: Secondary | ICD-10-CM | POA: Diagnosis not present

## 2023-05-14 DIAGNOSIS — I2510 Atherosclerotic heart disease of native coronary artery without angina pectoris: Secondary | ICD-10-CM | POA: Diagnosis not present

## 2023-05-14 DIAGNOSIS — I4892 Unspecified atrial flutter: Secondary | ICD-10-CM | POA: Diagnosis not present

## 2023-05-14 DIAGNOSIS — E1122 Type 2 diabetes mellitus with diabetic chronic kidney disease: Secondary | ICD-10-CM | POA: Diagnosis not present

## 2023-05-14 DIAGNOSIS — E785 Hyperlipidemia, unspecified: Secondary | ICD-10-CM | POA: Diagnosis not present

## 2023-05-14 DIAGNOSIS — N1831 Chronic kidney disease, stage 3a: Secondary | ICD-10-CM | POA: Diagnosis not present

## 2023-05-14 DIAGNOSIS — E1151 Type 2 diabetes mellitus with diabetic peripheral angiopathy without gangrene: Secondary | ICD-10-CM | POA: Diagnosis not present

## 2023-05-14 DIAGNOSIS — G3184 Mild cognitive impairment, so stated: Secondary | ICD-10-CM | POA: Diagnosis not present

## 2023-05-14 DIAGNOSIS — Z87891 Personal history of nicotine dependence: Secondary | ICD-10-CM | POA: Diagnosis not present

## 2023-05-28 ENCOUNTER — Ambulatory Visit: Payer: Self-pay

## 2023-05-28 NOTE — Patient Instructions (Signed)
Visit Information  Thank you for taking time to visit with me today. Please don't hesitate to contact me if I can be of assistance to you.   Following are the goals we discussed today:   Goals Addressed             This Visit's Progress    Management/ education for health conditions       Interventions Today    Flowsheet Row Most Recent Value  Chronic Disease   Chronic disease during today's visit Diabetes, Other  [mild cognitive decline, lack of caregiver support.]  General Interventions   General Interventions Discussed/Reviewed General Interventions Reviewed, Doctor Visits  [evaluation of current treatment plan for mentioned health conditions and patients adherence to plan as established by provider.   Assessed health status related to diabetes.  Inquired if son ready to pursue assisted living housing.]  Doctor Visits Discussed/Reviewed Doctor Visits Reviewed  Education Interventions   Education Provided Provided Education  [Advised son to follow up with contact regarding patient's hearing date.  Advised son to update RNCM on patients hearing date and for any additional needs/ concerns. Inquired if additional follow up with social worker was needed.]  Pharmacy Interventions   Pharmacy Dicussed/Reviewed Pharmacy Topics Reviewed  [name] patients status with medication adherence.]                 Our next appointment is by telephone on 07/11/23 at 2 pm  Please call the care guide team at [phone] if you need to cancel or reschedule your appointment.   If you are experiencing a Mental Health or Behavioral Health Crisis or need someone to talk to, please call the Suicide and Crisis Lifeline: 988 call 1-800-273-TALK (toll free, 24 hour hotline)  The patient verbalized understanding of instructions, educational materials, and care plan provided today and agreed to receive a mailed copy of patient instructions, educational materials, and care plan.   Randy Ina  RN,BSN,CCM [place]  [facility], [place] coordinator / Case Manager Phone: [phone]
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
## 2023-06-05 NOTE — Progress Notes (Signed)
No ICM remote transmission received for 05/17/2023 and next ICM transmission scheduled for 06/17/2023.

## 2023-06-17 NOTE — Progress Notes (Unsigned)
No ICM remote transmission received for 06/17/2023 and next ICM transmission scheduled for 07/08/2023.

## 2023-07-09 NOTE — Progress Notes (Signed)
No ICM remote transmission received for 07/08/2023 and next ICM transmission scheduled for 08/05/2023.

## 2023-07-10 ENCOUNTER — Telehealth: Payer: Self-pay | Admitting: Nurse Practitioner

## 2023-07-10 NOTE — Telephone Encounter (Signed)
Camillie from [employer] called in and would a call back in regards to a FL2 for patient. She can be reached at [phone]. Thank you!

## 2023-07-11 ENCOUNTER — Ambulatory Visit: Payer: Self-pay

## 2023-07-11 NOTE — Patient Outreach (Signed)
  Care Coordination   Follow Up Visit Note   07/11/2023 Name: Randy [name] MRN: [id number] DOB: 07/21/1946  Randy [name] is a 77 y.o. year old male who sees [name], [name], NP for primary care. I  spoke with son/ designated party release, Randy [name].   What matters to the patients health and wellness today?  Son states patient had hearing with Adult protective services last week regarding placement. He states she is waiting to hear back regarding the hearing with results and possible placement time frame.  Son states patient continues to do about the same. He states he is not able to manage himself alone anymore. Son states he is checking on patient daily and patient has a friend that takes him for breakfast every morning. Son states he makes sure patient gets lunch/ dinner and also buys him supplies when needed.    Goals Addressed             This Visit's Progress    Management/ education for health conditions       Interventions Today    Flowsheet Row Most Recent Value  Chronic Disease   Chronic disease during today's visit Other  [mild cognitive impairment]  General Interventions   General Interventions Discussed/Reviewed General Interventions Reviewed, Doctor Visits  [evaluation of current treatment plan for mild cognitive impairment and patients adherence to plan as established by provider. Inquired if patient had hearing for placement.]  Doctor Visits Discussed/Reviewed Doctor Visits Reviewed  Randy [name] son of importance of patient completing his ICM remote transmissions as recommended.]  Education Interventions   Education Provided Provided Education  [Advised son to attempt contact with Randy [name] at Adult protective services regarding FL2.  Advised to inform her primary care provider office is attempting to reach her.]  Nutrition Interventions   Nutrition Discussed/Reviewed Nutrition Reviewed  [Confirmed patient has assistance with obtaining food/ meals.]                  SDOH assessments and interventions completed:  No     Care Coordination Interventions:  Yes, provided   Follow up plan: Follow up call scheduled for 09/22/22    Encounter Outcome:  Patient Visit Completed   Randy Ina RN,BSN,CCM [facility]  [facility], [facility] coordinator / Case Manager Phone: [phone]

## 2023-07-11 NOTE — Telephone Encounter (Signed)
Contacted  Camillie from [employer]. No answer, left detailed voicemail for her to call the office back regarding FL2 for pt.

## 2023-07-11 NOTE — Telephone Encounter (Signed)
Camillie called back in today to follow up on this request. She stated that they are urgently needing that FL2 for patient. She can be reached at [phone] and FL2 can be faxed over to [phone]. Thank you!

## 2023-07-11 NOTE — Telephone Encounter (Signed)
[name] from [employer] called back.  She stated that pt couldnt do independent & didn't have medical need for skilled. she was leaning towards memory care & assisted.  Advised to pt that pcp is leaning more towards assisted and she stated that this will be fine for now. She also stated that if more is needed she will just give a our office a call.   Send to: Sander Radon Fax number: [phone]

## 2023-07-11 NOTE — Telephone Encounter (Signed)
Can we call and have it re faxed. Also find out what type of care the patient will be going to (independent, assisted living, or skilled)

## 2023-07-11 NOTE — Telephone Encounter (Signed)
Form has been filled out to be faxed. It was handed to Melina Copa, [place]

## 2023-07-12 NOTE — Telephone Encounter (Signed)
Formed has been faxed over to [employer] at [phone]. Thank you!

## 2023-07-22 ENCOUNTER — Telehealth: Payer: Self-pay

## 2023-07-22 NOTE — Patient Outreach (Signed)
  Care Coordination   07/22/2023 Name: Randy [name] MRN: [id number] DOB: March 10, 1946   Care Coordination Outreach Attempts:  An unsuccessful telephone outreach was attempted today to offer the patient information about available complex care management services. Unable reach patients son, Duayne Hernandezgarci or leave voice message due to mailbox being full.   Follow Up Plan:  Additional outreach attempts will be made to offer the patient complex care management information and services.   Encounter Outcome:  No Answer   Care Coordination Interventions:  No, not indicated    [name] RN,BSN,CCM [facility]  [facility], [facility] coordinator / Case Manager Phone: [phone]

## 2023-07-30 ENCOUNTER — Ambulatory Visit: Payer: Self-pay

## 2023-07-30 NOTE — Patient Outreach (Signed)
  Care Coordination   07/30/2023 Name: Randy [name] MRN: [id number] DOB: [birth year]   Care Coordination Outreach Attempts:  An unsuccessful outreach was attempted for an appointment today. HIPAA compliant message left with return call phone number.   Follow Up Plan:  Additional outreach attempts will be made to offer the patient complex care management information and services.   Encounter Outcome:  No Answer   Care Coordination Interventions:  No, not indicated    Arvin Seip RN,BSN,CCM [facility]  [facility], [facility] coordinator / Case Manager Phone: [phone]

## 2023-08-07 NOTE — Progress Notes (Signed)
 No ICM remote transmission received for 08/05/2023 and next ICM transmission scheduled for 08/20/2023.

## 2023-08-22 NOTE — Progress Notes (Signed)
Unable to reach patient for monthly ICM remote follow up and not receiving monthly remote transmission.  Patient disenrolled due patient is not actively participating in ICM clinic.  Device clinic 91 day remote monitoring will continue per protocol.   ?

## 2023-08-28 ENCOUNTER — Telehealth: Payer: Self-pay

## 2023-08-28 NOTE — Telephone Encounter (Signed)
Copied from CRM [id number]. Topic: Referral - Question >> Aug 28, 2023  1:00 PM Leavy Cella D wrote: Reason for CRM: Cala Bradford from [facility] Department of Social Services called stating that patient had an SL2 completed on 07/11/23. Caller wanted to know if Skilled Nursing could be added to SL2 . Pleas Call Joretta Bachelor back [phone]

## 2023-08-28 NOTE — Telephone Encounter (Signed)
Former FL2 and new FL2 have been placed in PCP folder in back. Awaiting review and signature.

## 2023-08-28 NOTE — Telephone Encounter (Signed)
Can we print off the former FL2 and a new FL2 and place in my box to be filled out please

## 2023-08-30 NOTE — Telephone Encounter (Signed)
 Form has been completed and place in outgoing MA box

## 2023-09-20 NOTE — Telephone Encounter (Signed)
Reason for CRM: Randy [name] following up on [facility] form that was to be completed by proivder on 1/29.   Randy [name] [phone] fax

## 2023-09-23 NOTE — Telephone Encounter (Signed)
 I have re faxed fl2 with med list to number provided

## 2023-10-08 ENCOUNTER — Telehealth: Payer: Self-pay | Admitting: Internal Medicine

## 2023-10-08 ENCOUNTER — Other Ambulatory Visit: Payer: Self-pay

## 2023-10-08 DIAGNOSIS — I480 Paroxysmal atrial fibrillation: Secondary | ICD-10-CM

## 2023-10-08 DIAGNOSIS — I472 Ventricular tachycardia, unspecified: Secondary | ICD-10-CM

## 2023-10-08 MED ORDER — LOSARTAN POTASSIUM 25 MG PO TABS: 25.0000 mg | ORAL_TABLET | Freq: Every day | ORAL | 1 refills | Status: AC

## 2023-10-08 MED ORDER — AMIODARONE HCL 200 MG PO TABS
200.0000 mg | ORAL_TABLET | Freq: Every day | ORAL | 3 refills | Status: DC
Start: 2023-10-08 — End: 2023-12-10

## 2023-10-08 MED ORDER — METOPROLOL SUCCINATE ER 25 MG PO TB24: 25.0000 mg | ORAL_TABLET | Freq: Every day | ORAL | 1 refills | Status: AC

## 2023-10-08 MED ORDER — ATORVASTATIN CALCIUM 80 MG PO TABS: ORAL_TABLET | ORAL | 1 refills | Status: AC

## 2023-10-08 NOTE — Telephone Encounter (Signed)
*  STAT* If patient is at the pharmacy, call can be transferred to refill team.   1. Which medications need to be refilled? (please list name of each medication and dose if known)  amiodarone (PACERONE) 200 MG tablet  atorvastatin (LIPITOR) 80 MG tablet   losartan (COZAAR) 25 MG tablet  metoprolol succinate (TOPROL XL) 25 MG 24 hr tablet   Rivaroxaban (XARELTO) 15 MG TABS tablet   2. Which pharmacy/location (including street and city if local pharmacy) is medication to be sent to? [facility] - [place], [place] - [address] Phone: [phone]  Fax: [phone]     3. Do they need a 30 day or 90 day supply? 90  Pt was moved to a new group home and has no medications

## 2023-10-09 MED ORDER — RIVAROXABAN 15 MG PO TABS
15.0000 mg | ORAL_TABLET | Freq: Every day | ORAL | 0 refills | Status: AC
Start: 2023-10-09 — End: ?

## 2023-10-09 NOTE — Telephone Encounter (Signed)
 Prescription refill request for Xarelto received.  Indication: a fib Last office visit: 12/21/22 Weight: 176# Age:78 Scr: 1.53 epic 02/07/23 CrCl: 46 ml/min

## 2023-10-23 NOTE — Progress Notes (Unsigned)
  Electrophysiology Office Note:   ID:  Randy [name], DOB 05/22/1946, MRN [id number]  Primary Cardiologist: None Electrophysiologist: Lewayne Bunting, MD  {Click to update primary MD,subspecialty MD or APP then REFRESH:1}    History of Present Illness:   Randy [name] is a 78 y.o. male with h/o ICM, chronic CHF (systolic), VT, AFib, CAD (CABG 2019), CKD (III), HTN, HLD, and DM seen today for routine electrophysiology followup.   Since last being seen in our clinic the patient reports doing ***.  he denies chest pain, palpitations, dyspnea, PND, orthopnea, nausea, vomiting, dizziness, syncope, edema, weight gain, or early satiety.   Review of systems complete and found to be negative unless listed in HPI.   EP Information / Studies Reviewed:    EKG is ordered today. Personal review as below.       ICD Interrogation-  reviewed in detail today,  See PACEART report.  Arrhythmia/Device History Abbot Dual Chamber ICD 04/2008, RA lead 09/2016  [facility]  04/30/2018 I authorize all [facility] or [facility] to give PHI to:  Leodis Rains (friend [phone])   ; ok to leave messages on: home phone; [phone]   Physical Exam:   VS:  There were no vitals taken for this visit.   Wt Readings from Last 3 Encounters:  12/21/22 176 lb (79.8 kg)  11/08/22 183 lb 8 oz (83.2 kg)  09/27/22 183 lb 2 oz (83.1 kg)     GEN: No acute distress *** NECK: No JVD; No carotid bruits CARDIAC: {EPRHYTHM:28826}, no murmurs, rubs, gallops RESPIRATORY:  Clear to auscultation without rales, wheezing or rhonchi  ABDOMEN: Soft, non-tender, non-distended EXTREMITIES:  {EDEMA LEVEL:28147::"No"} edema; No deformity   ASSESSMENT AND PLAN:    Chronic systolic CHF  s/p Abbott dual chamber ICD  euvolemic today Stable on an appropriate medical regimen Normal ICD function See Pace Art report No changes today  VT Continue amiodarone 200 mg daily Surveillance labs today   Paroxysmal AF AFL *** burden Continue  Eliquis 5 mg BID for CHA2DS2VASc of at least 6  CAD Denies s/s ischemia  HTN Stable on current regimen    Disposition:   Follow up with {EPPROVIDERS:28135} {EPFOLLOW UP:28173}   Signed, Graciella Freer, PA-C

## 2023-10-23 NOTE — H&P (View-Only) (Signed)
  Electrophysiology Office Note:   ID:  Randy [name], DOB 1946/05/06, MRN [id number]  Primary Cardiologist: None Electrophysiologist: Lewayne Bunting, MD      History of Present Illness:   Randy [name] is a 78 y.o. male with h/o ICM, chronic CHF (systolic), VT, AFib, CAD (CABG 2019), CKD (III), HTN, HLD, and DM seen today for routine electrophysiology followup.   Since last being seen in our clinic the patient reports doing well overall.  he denies chest pain, palpitations, dyspnea, PND, orthopnea, nausea, vomiting, dizziness, syncope, edema, weight gain, or early satiety.   Review of systems complete and found to be negative unless listed in HPI.   EP Information / Studies Reviewed:    EKG is ordered today. Personal review as below.  EKG Interpretation Date/Time:  Thursday October 24 2023 08:23:41 EDT Ventricular Rate:  80 PR Interval:    QRS Duration:  118 QT Interval:  414 QTC Calculation: 477 R Axis:   57  Text Interpretation: Atrial fibrillation with pvcs Low voltage QRS Cannot rule out Anterior infarct , age undetermined T wave abnormality, consider inferior ischemia Confirmed by Maxine Glenn [phone]) on 10/24/2023 8:56:10 AM    ICD Interrogation-  reviewed in detail today,  See PACEART report.  Arrhythmia/Device History Abbot Dual Chamber ICD 04/2008, RA lead 09/2016  [facility]   Physical Exam:   VS:  BP 130/74 (BP Location: Left Arm, Patient Position: Sitting, Cuff Size: Normal)   Pulse 80   Resp 16   Ht 5\' 9"  (1.753 m)   Wt 173 lb 6.4 oz (78.7 kg)   SpO2 96%   BMI 25.61 kg/m    Wt Readings from Last 3 Encounters:  10/24/23 173 lb 6.4 oz (78.7 kg)  12/21/22 176 lb (79.8 kg)  11/08/22 183 lb 8 oz (83.2 kg)     GEN: No acute distress  NECK: No JVD; No carotid bruits CARDIAC: Irregularly irregular rate and rhythm, no murmurs, rubs, gallops RESPIRATORY:  Clear to auscultation without rales, wheezing or rhonchi  ABDOMEN: Soft, non-tender,  non-distended EXTREMITIES:  No edema; No deformity   ASSESSMENT AND PLAN:    Chronic systolic CHF  s/p Abbott dual chamber ICD  euvolemic today Stable on an appropriate medical regimen Normal ICD function Chronic RV elevated impedance RV threshold with gradual increase as well, Now 2.5V @ 0.4 and 0.8 ms, 2.25V @ 1.0 ms Auto Capture was successful, so programmed to 0.81ms for a slightly higher safety margin.  See [facility] Miss Art report  VT Continue amiodarone 200 mg daily Surveillance labs today   Persistent AF AFL Ongoing AF since start of February   Continue Eliquis 5 mg BID for CHA2DS2VASc of at least 6 Continue amiodarone 200 mg daily Surveillance labs today Will arrange [facility]   CAD Denies s/s ischemia  HTN Stable on current regimen    Disposition:   Follow up with EP APP in 2-3 weeks post cardioversion to re-assess both leads and rhythm.    Signed, Graciella Freer, PA-C

## 2023-10-24 ENCOUNTER — Encounter: Payer: Self-pay | Admitting: Student

## 2023-10-24 ENCOUNTER — Encounter: Payer: Self-pay | Admitting: *Deleted

## 2023-10-24 ENCOUNTER — Ambulatory Visit: Attending: Student | Admitting: Student

## 2023-10-24 VITALS — BP 130/74 | HR 80 | Resp 16 | Ht 69.0 in | Wt 173.4 lb

## 2023-10-24 DIAGNOSIS — I10 Essential (primary) hypertension: Secondary | ICD-10-CM | POA: Diagnosis not present

## 2023-10-24 DIAGNOSIS — Z79899 Other long term (current) drug therapy: Secondary | ICD-10-CM

## 2023-10-24 DIAGNOSIS — I5022 Chronic systolic (congestive) heart failure: Secondary | ICD-10-CM

## 2023-10-24 DIAGNOSIS — I472 Ventricular tachycardia, unspecified: Secondary | ICD-10-CM

## 2023-10-24 DIAGNOSIS — Z95810 Presence of automatic (implantable) cardiac defibrillator: Secondary | ICD-10-CM | POA: Diagnosis not present

## 2023-10-24 DIAGNOSIS — I255 Ischemic cardiomyopathy: Secondary | ICD-10-CM | POA: Diagnosis not present

## 2023-10-24 DIAGNOSIS — I480 Paroxysmal atrial fibrillation: Secondary | ICD-10-CM

## 2023-10-24 LAB — COMPREHENSIVE METABOLIC PANEL WITH GFR
ALT: 13 IU/L (ref 0–44)
AST: 17 IU/L (ref 0–40)
Albumin: 4.2 g/dL (ref 3.8–4.8)
Alkaline Phosphatase: 74 IU/L (ref 44–121)
BUN/Creatinine Ratio: 16 (ref 10–24)
BUN: 33 mg/dL — ABNORMAL HIGH (ref 8–27)
Bilirubin Total: 0.7 mg/dL (ref 0.0–1.2)
CO2: 20 mmol/L (ref 20–29)
Calcium: 9.6 mg/dL (ref 8.6–10.2)
Chloride: 105 mmol/L (ref 96–106)
Creatinine, Ser: 2.08 mg/dL — ABNORMAL HIGH (ref 0.76–1.27)
Globulin, Total: 2.3 g/dL (ref 1.5–4.5)
Glucose: 145 mg/dL — ABNORMAL HIGH (ref 70–99)
Potassium: 5.1 mmol/L (ref 3.5–5.2)
Sodium: 141 mmol/L (ref 134–144)
Total Protein: 6.5 g/dL (ref 6.0–8.5)
eGFR: 32 mL/min/{1.73_m2} — ABNORMAL LOW (ref >59)

## 2023-10-24 LAB — CUP PACEART INCLINIC DEVICE CHECK
Battery Remaining Longevity: 30 mo
Brady Statistic RA Percent Paced: 0.23 %
Brady Statistic RV Percent Paced: 11 %
Date Time Interrogation Session: 20250327085654
HighPow Impedance: 57.375
Implantable Lead Connection Status: 753985
Implantable Lead Connection Status: 753985
Implantable Lead Implant Date: 20091022
Implantable Lead Implant Date: 20180322
Implantable Lead Location: 753859
Implantable Lead Location: 753860
Implantable Lead Model: 7121
Implantable Pulse Generator Implant Date: 20180322
Lead Channel Impedance Value: 2225 Ohm
Lead Channel Impedance Value: 287.5 Ohm
Lead Channel Pacing Threshold Amplitude: 1 V
Lead Channel Pacing Threshold Amplitude: 2.25 V
Lead Channel Pacing Threshold Amplitude: 2.375 V
Lead Channel Pacing Threshold Pulse Width: 0.5 ms
Lead Channel Pacing Threshold Pulse Width: 0.8 ms
Lead Channel Pacing Threshold Pulse Width: 1 ms
Lead Channel Sensing Intrinsic Amplitude: 1.3 mV
Lead Channel Sensing Intrinsic Amplitude: 6.4 mV
Lead Channel Setting Pacing Amplitude: 2 V
Lead Channel Setting Pacing Amplitude: 2.625
Lead Channel Setting Pacing Pulse Width: 0.8 ms
Lead Channel Setting Sensing Sensitivity: 0.5 mV
Pulse Gen Serial Number: 7413245

## 2023-10-24 LAB — TSH: TSH: 3.11 u[IU]/mL (ref 0.450–4.500)

## 2023-10-24 LAB — T4, FREE: Free T4: 1.51 ng/dL (ref 0.82–1.77)

## 2023-10-24 NOTE — Patient Instructions (Addendum)
 Medication Instructions:  Your physician recommends that you continue on your current medications as directed. Please refer to the Current Medication list given to you today.  *If you need a refill on your cardiac medications before your next appointment, please call your pharmacy*  Lab Work: TODAY-CMET, CBC, TSH, FreeT4 If you have labs (blood work) drawn today and your tests are completely normal, you will receive your results only by: MyChart Message (if you have MyChart) OR A paper copy in the mail If you have any lab test that is abnormal or we need to change your treatment, we will call you to review the results.   Testing/Procedures: Your physician has recommended that you have a Cardioversion (DCCV) on 10/29/23. Electrical Cardioversion uses a jolt of electricity to your heart either through paddles or wired patches attached to your chest. This is a controlled, usually prescheduled, procedure. Defibrillation is done under light anesthesia in the hospital, and you usually go home the day of the procedure. This is done to get your heart back into a normal rhythm. You are not awake for the procedure. Please see the instruction sheet given to you today.  Follow-Up: At [facility], you and your health needs are our priority.  As part of our continuing mission to provide you with exceptional heart care, we have created designated Provider Care Teams.  These Care Teams include your primary Cardiologist (physician) and Advanced Practice Providers (APPs -  Physician Assistants and Nurse Practitioners) who all work together to provide you with the care you need, when you need it.  We recommend signing up for the patient portal called "MyChart".  Sign up information is provided on this After Visit Summary.  MyChart is used to connect with patients for Virtual Visits (Telemedicine).  Patients are able to view lab/test results, encounter notes, upcoming appointments, etc.  Non-urgent messages  can be sent to your provider as well.   To learn more about what you can do with MyChart, go to [email or web address].    Your next appointment:   2-3 week(s) after cardioversion  Provider:   Casimiro Needle "Otilio Saber, PA-C

## 2023-10-28 NOTE — Progress Notes (Signed)
 Called patient with pre-procedure instructions for tomorrow. Unable to reach patient. Generic voicemail set up, so this RN left generic message for patient.

## 2023-10-28 NOTE — Progress Notes (Signed)
 Called patient with pre-procedure instructions for tomorrow. Patient's caregiver, Jerrye Beavers, returned this RN's phone call.  Patient informed of:   Time to arrive for procedure. 0800 Remain NPO past midnight.  Must have a ride home and a responsible adult to remain with them for 24 hours post procedure.  Confirmed blood thinner. Xarelto Confirmed no breaks in taking blood thinner for 3+ weeks prior to procedure. Confirmed patient stopped all GLP-1s and GLP-2s for at least one week before procedure. Currently not taking prescribed Semaglutide or glipizide

## 2023-10-29 ENCOUNTER — Encounter (HOSPITAL_COMMUNITY): Payer: Self-pay | Admitting: Internal Medicine

## 2023-10-29 ENCOUNTER — Ambulatory Visit (HOSPITAL_COMMUNITY): Admitting: Anesthesiology

## 2023-10-29 ENCOUNTER — Encounter (HOSPITAL_COMMUNITY)
Admission: RE | Disposition: A | Discharge status: Home / Self Care | Payer: Self-pay | Source: Home / Self Care | Attending: Internal Medicine

## 2023-10-29 ENCOUNTER — Ambulatory Visit (HOSPITAL_COMMUNITY)
Admission: RE | Admit: 2023-10-29 | Discharge: 2023-10-29 | Disposition: A | Discharge status: Home / Self Care | Attending: Internal Medicine | Admitting: Internal Medicine

## 2023-10-29 ENCOUNTER — Other Ambulatory Visit: Payer: Self-pay

## 2023-10-29 DIAGNOSIS — I5022 Chronic systolic (congestive) heart failure: Secondary | ICD-10-CM | POA: Diagnosis not present

## 2023-10-29 DIAGNOSIS — Z7901 Long term (current) use of anticoagulants: Secondary | ICD-10-CM | POA: Diagnosis not present

## 2023-10-29 DIAGNOSIS — I2510 Atherosclerotic heart disease of native coronary artery without angina pectoris: Secondary | ICD-10-CM | POA: Diagnosis not present

## 2023-10-29 DIAGNOSIS — Z79899 Other long term (current) drug therapy: Secondary | ICD-10-CM | POA: Insufficient documentation

## 2023-10-29 DIAGNOSIS — E1122 Type 2 diabetes mellitus with diabetic chronic kidney disease: Secondary | ICD-10-CM | POA: Diagnosis not present

## 2023-10-29 DIAGNOSIS — I130 Hypertensive heart and chronic kidney disease with heart failure and stage 1 through stage 4 chronic kidney disease, or unspecified chronic kidney disease: Secondary | ICD-10-CM | POA: Insufficient documentation

## 2023-10-29 DIAGNOSIS — M1990 Unspecified osteoarthritis, unspecified site: Secondary | ICD-10-CM | POA: Diagnosis not present

## 2023-10-29 DIAGNOSIS — Z95810 Presence of automatic (implantable) cardiac defibrillator: Secondary | ICD-10-CM | POA: Diagnosis not present

## 2023-10-29 DIAGNOSIS — I4819 Other persistent atrial fibrillation: Secondary | ICD-10-CM

## 2023-10-29 DIAGNOSIS — Z7984 Long term (current) use of oral hypoglycemic drugs: Secondary | ICD-10-CM | POA: Insufficient documentation

## 2023-10-29 DIAGNOSIS — N189 Chronic kidney disease, unspecified: Secondary | ICD-10-CM | POA: Diagnosis not present

## 2023-10-29 DIAGNOSIS — I4891 Unspecified atrial fibrillation: Secondary | ICD-10-CM

## 2023-10-29 DIAGNOSIS — J449 Chronic obstructive pulmonary disease, unspecified: Secondary | ICD-10-CM | POA: Insufficient documentation

## 2023-10-29 DIAGNOSIS — Z87891 Personal history of nicotine dependence: Secondary | ICD-10-CM | POA: Diagnosis not present

## 2023-10-29 DIAGNOSIS — N183 Chronic kidney disease, stage 3 unspecified: Secondary | ICD-10-CM | POA: Insufficient documentation

## 2023-10-29 DIAGNOSIS — I509 Heart failure, unspecified: Secondary | ICD-10-CM

## 2023-10-29 DIAGNOSIS — I252 Old myocardial infarction: Secondary | ICD-10-CM | POA: Insufficient documentation

## 2023-10-29 LAB — POCT I-STAT, CHEM 8
BUN: 33 mg/dL — ABNORMAL HIGH (ref 8–23)
Calcium, Ion: 1.2 mmol/L (ref 1.15–1.40)
Chloride: 107 mmol/L (ref 98–111)
Creatinine, Ser: 2 mg/dL — ABNORMAL HIGH (ref 0.61–1.24)
Glucose, Bld: 112 mg/dL — ABNORMAL HIGH (ref 70–99)
HCT: 34 % — ABNORMAL LOW (ref 39.0–52.0)
Hemoglobin: 11.6 g/dL — ABNORMAL LOW (ref 13.0–17.0)
Potassium: 3.9 mmol/L (ref 3.5–5.1)
Sodium: 143 mmol/L (ref 135–145)
TCO2: 21 mmol/L — ABNORMAL LOW (ref 22–32)

## 2023-10-29 SURGERY — CARDIOVERSION (CATH LAB)
Anesthesia: General

## 2023-10-29 MED ORDER — SODIUM CHLORIDE 0.9% FLUSH
3.0000 mL | Freq: Two times a day (BID) | INTRAVENOUS | Status: DC
Start: 2023-10-29 — End: 2023-10-29

## 2023-10-29 MED ORDER — PROPOFOL 10 MG/ML IV BOLUS
INTRAVENOUS | Status: DC | PRN
  Administered 2023-10-29: 70 mg via INTRAVENOUS

## 2023-10-29 MED ORDER — SODIUM CHLORIDE 0.9% FLUSH: 3.0000 mL | INTRAVENOUS | Status: DC | PRN

## 2023-10-29 SURGICAL SUPPLY — 1 items: PAD DEFIB RADIO PHYSIO CONN (PAD) ×1 IMPLANT

## 2023-10-29 NOTE — Anesthesia Preprocedure Evaluation (Signed)
 Anesthesia Evaluation  Patient identified by MRN, date of birth, ID band Patient awake    Reviewed: Allergy & Precautions, NPO status , Patient's Chart, lab work & pertinent test results  Airway Mallampati: II  TM Distance: >3 FB Neck ROM: Full    Dental no notable dental hx.    Pulmonary sleep apnea , COPD, former smoker   Pulmonary exam normal        Cardiovascular hypertension, Pt. on medications + CAD, + Past MI and +CHF  + pacemaker + Cardiac Defibrillator  Rhythm:Irregular Rate:Normal     Neuro/Psych negative neurological ROS  negative psych ROS   GI/Hepatic Neg liver ROS,GERD  ,,  Endo/Other  diabetes, Type 2, Oral Hypoglycemic Agents    Renal/GU CRFRenal disease  negative genitourinary   Musculoskeletal  (+) Arthritis , Osteoarthritis,    Abdominal Normal abdominal exam  (+)   Peds  Hematology  (+) Blood dyscrasia, anemia   Anesthesia Other Findings   Reproductive/Obstetrics                             Anesthesia Physical Anesthesia Plan  ASA: 4  Anesthesia Plan: General   Post-op Pain Management:    Induction: Intravenous  PONV Risk Score and Plan: 2 and Treatment may vary due to age or medical condition  Airway Management Planned: Mask  Additional Equipment: None  Intra-op Plan:   Post-operative Plan:   Informed Consent: I have reviewed the patients History and Physical, chart, labs and discussed the procedure including the risks, benefits and alternatives for the proposed anesthesia with the patient or authorized representative who has indicated his/her understanding and acceptance.     Dental advisory given  Plan Discussed with: CRNA  Anesthesia Plan Comments:        Anesthesia Quick Evaluation

## 2023-10-29 NOTE — Transfer of Care (Signed)
 Immediate Anesthesia Transfer of Care Note  Patient: Randy [name]  Procedure(s) Performed: CARDIOVERSION  Patient Location: PACU  Anesthesia Type:General  Level of Consciousness: awake  Airway & Oxygen Therapy: Patient Spontanous Breathing and Patient connected to nasal cannula oxygen  Post-op Assessment: Report given to RN and Post -op Vital signs reviewed and stable  Post vital signs: Reviewed and stable  Last Vitals:  Vitals Value Taken Time  BP 138/87 10/29/23 0845  Temp    Pulse 84 10/29/23 0849  Resp 18 10/29/23 0849  SpO2 96 % 10/29/23 0849  Vitals shown include unfiled device data.  Last Pain:  Vitals:   10/29/23 0828  TempSrc:   PainSc: 0-No pain         Complications: No notable events documented.

## 2023-10-29 NOTE — Anesthesia Postprocedure Evaluation (Signed)
 Anesthesia Post Note  Patient: Randy [name]  Procedure(s) Performed: CARDIOVERSION     Patient location during evaluation: PACU Anesthesia Type: General Level of consciousness: awake and alert Pain management: pain level controlled Vital Signs Assessment: post-procedure vital signs reviewed and stable Respiratory status: spontaneous breathing, nonlabored ventilation, respiratory function stable and patient connected to nasal cannula oxygen Cardiovascular status: blood pressure returned to baseline and stable Postop Assessment: no apparent nausea or vomiting Anesthetic complications: no   No notable events documented.  Last Vitals:  Vitals:   10/29/23 0911 10/29/23 0921  BP: 117/72 121/70  Pulse: 67 68  Resp: 11 17  Temp:    SpO2: 98% 98%    Last Pain:  Vitals:   10/29/23 0921  TempSrc:   PainSc: 0-No pain                 Nelle Don Jayzen Paver

## 2023-10-29 NOTE — Discharge Instructions (Signed)

## 2023-10-29 NOTE — CV Procedure (Signed)
 Procedure: Electrical Cardioversion Indications:  Atrial Fibrillation  Procedure Details:  Consent: Risks of procedure as well as the alternatives and risks of each were explained to the (patient/caregiver).  Consent for procedure obtained.  Time Out: Verified patient identification, verified procedure, site/side was marked, verified correct patient position, special equipment/implants available, medications/allergies/relevent history reviewed, required imaging and test results available. PERFORMED.  Patient placed on cardiac monitor, pulse oximetry, supplemental oxygen as necessary.  Sedation given:  propofol  Pacer pads placed anterior and posterior chest.  Cardioverted 1 time(s).  Cardioversion with synchronized biphasic 360J shock.  Evaluation: Findings: Post procedure EKG shows: NSR, Ap- VP, AS- V paced Complications: None Patient did tolerate procedure well.  Time Spent Directly with the Patient:  15 minutes   Randy [name] 10/29/2023, 8:59 AM

## 2023-10-29 NOTE — Interval H&P Note (Signed)
 History and Physical Interval Note:  10/29/2023 8:28 AM  Randy [name]  has presented today for surgery, with the diagnosis of AFIB.  The various methods of treatment have been discussed with the patient and family. After consideration of risks, benefits and other options for treatment, the patient has consented to  Procedure(s): CARDIOVERSION (N/A) as a surgical intervention.  The patient's history has been reviewed, patient examined, no change in status, stable for surgery.  I have reviewed the patient's chart and labs.  Questions were answered to the patient's satisfaction.     [name]

## 2023-11-07 DIAGNOSIS — M6281 Muscle weakness (generalized): Secondary | ICD-10-CM | POA: Diagnosis not present

## 2023-11-07 DIAGNOSIS — N189 Chronic kidney disease, unspecified: Secondary | ICD-10-CM | POA: Diagnosis not present

## 2023-11-07 DIAGNOSIS — I10 Essential (primary) hypertension: Secondary | ICD-10-CM | POA: Diagnosis not present

## 2023-11-07 DIAGNOSIS — Z950 Presence of cardiac pacemaker: Secondary | ICD-10-CM | POA: Diagnosis not present

## 2023-11-07 DIAGNOSIS — E785 Hyperlipidemia, unspecified: Secondary | ICD-10-CM | POA: Diagnosis not present

## 2023-11-07 DIAGNOSIS — G3184 Mild cognitive impairment, so stated: Secondary | ICD-10-CM | POA: Diagnosis not present

## 2023-11-07 DIAGNOSIS — R609 Edema, unspecified: Secondary | ICD-10-CM | POA: Diagnosis not present

## 2023-11-07 DIAGNOSIS — Z7902 Long term (current) use of antithrombotics/antiplatelets: Secondary | ICD-10-CM | POA: Diagnosis not present

## 2023-11-07 DIAGNOSIS — E119 Type 2 diabetes mellitus without complications: Secondary | ICD-10-CM | POA: Diagnosis not present

## 2023-11-13 NOTE — Progress Notes (Deleted)
  Electrophysiology Office Note:   ID:  Randy [name], DOB 1945/09/21, MRN [id number]  Primary Cardiologist: None Electrophysiologist: Manya Sells, MD  {Click to update primary MD,subspecialty MD or APP then REFRESH:1}    History of Present Illness:   Randy [name] is a 78 y.o. male with h/o ICM, chronic CHF (systolic), VT, AFib, CAD (CABG 2019), CKD (III), HTN, HLD, and DM  seen today for routine electrophysiology followup.   S/p [facility]  Since last being seen in our clinic the patient reports doing ***.  he denies chest pain, palpitations, dyspnea, PND, orthopnea, nausea, vomiting, dizziness, syncope, edema, weight gain, or early satiety.   Review of systems complete and found to be negative unless listed in HPI.   EP Information / Studies Reviewed:    EKG is not ordered today. EKG from 10/29/2023 reviewed which showed NSR s/p [facility]       ICD Interrogation-  reviewed in detail today,  See PACEART report.  Arrhythmia/Device History Abbot Dual Chamber ICD 04/2008, RA lead 09/2016  [facility]   Physical Exam:   VS:  There were no vitals taken for this visit.   Wt Readings from Last 3 Encounters:  10/29/23 140 lb (63.5 kg)  10/24/23 173 lb 6.4 oz (78.7 kg)  12/21/22 176 lb (79.8 kg)     GEN: No acute distress *** NECK: No JVD; No carotid bruits CARDIAC: {EPRHYTHM:28826}, no murmurs, rubs, gallops RESPIRATORY:  Clear to auscultation without rales, wheezing or rhonchi  ABDOMEN: Soft, non-tender, non-distended EXTREMITIES:  {EDEMA LEVEL:28147::"No"} edema; No deformity   ASSESSMENT AND PLAN:    Chronic systolic CHF  s/p Abbott dual chamber ICD  euvolemic today Stable on an appropriate medical regimen Normal ICD function See Pace Art report No changes today  VT Continue amiodarone 200 mg daily Recent labs stable.   Persistent AF AFL S/p [facility] Continue Eliquis 5 mg BID for CHA2DS2VASc of at least 6 Continue amiodarone 200 mg daily.   CAD Denies s/s  ischemia  HTN Stable on current regimen    Disposition:   Follow up with {EPPROVIDERS:28135} {EPFOLLOW UP:28173}   Signed, Tylene Galla, PA-C

## 2023-11-14 ENCOUNTER — Ambulatory Visit: Attending: Student | Admitting: Student

## 2023-11-14 DIAGNOSIS — I10 Essential (primary) hypertension: Secondary | ICD-10-CM | POA: Diagnosis not present

## 2023-11-14 DIAGNOSIS — N189 Chronic kidney disease, unspecified: Secondary | ICD-10-CM | POA: Diagnosis not present

## 2023-11-14 DIAGNOSIS — I4819 Other persistent atrial fibrillation: Secondary | ICD-10-CM

## 2023-11-14 DIAGNOSIS — E785 Hyperlipidemia, unspecified: Secondary | ICD-10-CM | POA: Diagnosis not present

## 2023-11-14 DIAGNOSIS — I5022 Chronic systolic (congestive) heart failure: Secondary | ICD-10-CM

## 2023-11-14 DIAGNOSIS — E119 Type 2 diabetes mellitus without complications: Secondary | ICD-10-CM | POA: Diagnosis not present

## 2023-11-14 DIAGNOSIS — I255 Ischemic cardiomyopathy: Secondary | ICD-10-CM

## 2023-11-14 DIAGNOSIS — I472 Ventricular tachycardia, unspecified: Secondary | ICD-10-CM

## 2023-11-22 ENCOUNTER — Emergency Department (HOSPITAL_COMMUNITY)

## 2023-11-22 ENCOUNTER — Emergency Department (HOSPITAL_COMMUNITY)
Admission: EM | Admit: 2023-11-22 | Discharge: 2023-11-22 | Disposition: A | Discharge status: Home / Self Care | Attending: Emergency Medicine | Admitting: Emergency Medicine

## 2023-11-22 DIAGNOSIS — R0989 Other specified symptoms and signs involving the circulatory and respiratory systems: Secondary | ICD-10-CM | POA: Diagnosis not present

## 2023-11-22 DIAGNOSIS — R519 Headache, unspecified: Secondary | ICD-10-CM | POA: Diagnosis not present

## 2023-11-22 DIAGNOSIS — J90 Pleural effusion, not elsewhere classified: Secondary | ICD-10-CM | POA: Diagnosis not present

## 2023-11-22 DIAGNOSIS — S0990XA Unspecified injury of head, initial encounter: Secondary | ICD-10-CM | POA: Diagnosis not present

## 2023-11-22 DIAGNOSIS — Z7901 Long term (current) use of anticoagulants: Secondary | ICD-10-CM | POA: Insufficient documentation

## 2023-11-22 DIAGNOSIS — M542 Cervicalgia: Secondary | ICD-10-CM | POA: Insufficient documentation

## 2023-11-22 DIAGNOSIS — M19021 Primary osteoarthritis, right elbow: Secondary | ICD-10-CM | POA: Diagnosis not present

## 2023-11-22 DIAGNOSIS — Z043 Encounter for examination and observation following other accident: Secondary | ICD-10-CM | POA: Diagnosis not present

## 2023-11-22 DIAGNOSIS — W19XXXA Unspecified fall, initial encounter: Secondary | ICD-10-CM | POA: Diagnosis not present

## 2023-11-22 DIAGNOSIS — M25521 Pain in right elbow: Secondary | ICD-10-CM | POA: Insufficient documentation

## 2023-11-22 DIAGNOSIS — Z981 Arthrodesis status: Secondary | ICD-10-CM | POA: Diagnosis not present

## 2023-11-22 DIAGNOSIS — I700 Atherosclerosis of aorta: Secondary | ICD-10-CM | POA: Diagnosis not present

## 2023-11-22 LAB — PROTIME-INR
INR: 1.9 — ABNORMAL HIGH (ref 0.8–1.2)
Prothrombin Time: 22.4 s — ABNORMAL HIGH (ref 11.4–15.2)

## 2023-11-22 LAB — CBC
HCT: 36.8 % — ABNORMAL LOW (ref 39.0–52.0)
Hemoglobin: 11.7 g/dL — ABNORMAL LOW (ref 13.0–17.0)
MCH: 29.3 pg (ref 26.0–34.0)
MCHC: 31.8 g/dL (ref 30.0–36.0)
MCV: 92.2 fL (ref 80.0–100.0)
Platelets: 121 10*3/uL — ABNORMAL LOW (ref 150–400)
RBC: 3.99 MIL/uL — ABNORMAL LOW (ref 4.22–5.81)
RDW: 16.2 % — ABNORMAL HIGH (ref 11.5–15.5)
WBC: 6.3 10*3/uL (ref 4.0–10.5)
nRBC: 0 % (ref 0.0–0.2)

## 2023-11-22 LAB — COMPREHENSIVE METABOLIC PANEL WITH GFR
ALT: 10 U/L (ref 0–44)
AST: 18 U/L (ref 15–41)
Albumin: 3.3 g/dL — ABNORMAL LOW (ref 3.5–5.0)
Alkaline Phosphatase: 51 U/L (ref 38–126)
Anion gap: 14 (ref 5–15)
BUN: 27 mg/dL — ABNORMAL HIGH (ref 8–23)
CO2: 23 mmol/L (ref 22–32)
Calcium: 8.9 mg/dL (ref 8.9–10.3)
Chloride: 106 mmol/L (ref 98–111)
Creatinine, Ser: 1.71 mg/dL — ABNORMAL HIGH (ref 0.61–1.24)
GFR, Estimated: 40 mL/min — ABNORMAL LOW (ref >60)
Glucose, Bld: 124 mg/dL — ABNORMAL HIGH (ref 70–99)
Potassium: 4 mmol/L (ref 3.5–5.1)
Sodium: 143 mmol/L (ref 135–145)
Total Bilirubin: 1.1 mg/dL (ref 0.0–1.2)
Total Protein: 6 g/dL — ABNORMAL LOW (ref 6.5–8.1)

## 2023-11-22 LAB — I-STAT CHEM 8, ED
BUN: 29 mg/dL — ABNORMAL HIGH (ref 8–23)
Calcium, Ion: 1.1 mmol/L — ABNORMAL LOW (ref 1.15–1.40)
Chloride: 106 mmol/L (ref 98–111)
Creatinine, Ser: 1.9 mg/dL — ABNORMAL HIGH (ref 0.61–1.24)
Glucose, Bld: 122 mg/dL — ABNORMAL HIGH (ref 70–99)
HCT: 37 % — ABNORMAL LOW (ref 39.0–52.0)
Hemoglobin: 12.6 g/dL — ABNORMAL LOW (ref 13.0–17.0)
Potassium: 3.9 mmol/L (ref 3.5–5.1)
Sodium: 142 mmol/L (ref 135–145)
TCO2: 24 mmol/L (ref 22–32)

## 2023-11-22 LAB — ETHANOL: Alcohol, Ethyl (B): <15 mg/dL (ref <15)

## 2023-11-22 NOTE — ED Provider Notes (Signed)
 [place] [facility]MENT AT [facility] Provider Note   CSN: [id number] Arrival date & time: 11/22/23  1301     History  No chief complaint on file.   Randy [name] is a 78 y.o. male.  HPI Patient presents as a level 2 trauma, arriving via EMS.  Patient himself states that he was feeling okay, recalls falling, striking his head, since that time has had head pain, neck pain, right elbow pain.  No loss of sensation in any extremity, no inability to move the right hand.  He seemingly has been taking his medication as directed.  EMS reports 1 low blood pressure reading otherwise patient was hemodynamically unremarkable in transport.    Home Medications Prior to Admission medications   Medication Sig Start Date End Date Taking? Authorizing Provider  allopurinol  (ZYLOPRIM ) 100 MG tablet TAKE 1/2 TABLET BY MOUTH AT NOON 09/19/22  Yes Dorothe Gaster, NP  amiodarone  (PACERONE ) 200 MG tablet Take 1 tablet (200 mg total) by mouth daily. 10/08/23  Yes Tammie Fall, MD  atorvastatin  (LIPITOR ) 80 MG tablet TAKE ONE TABLET BY MOUTH AT NOON 10/08/23  Yes Tammie Fall, MD  donepezil  (ARICEPT ) 5 MG tablet TAKE ONE TABLET BY MOUTH AT NOON 10/18/22  Yes Dorothe Gaster, NP  ezetimibe  (ZETIA ) 10 MG tablet TAKE ONE TABLET BY MOUTH AT NOON 08/21/22  Yes Dorothe Gaster, NP  finasteride  (PROSCAR ) 5 MG tablet TAKE ONE TABLET BY MOUTH AT NOON 10/18/22  Yes Dorothe Gaster, NP  furosemide  (LASIX ) 20 MG tablet TAKE ONE TABLET BY MOUTH AT NOON 09/19/22  Yes Dorothe Gaster, NP  gabapentin  (NEURONTIN ) 300 MG capsule TAKE TWO CAPSULES BY MOUTH TWICE DAILY 03/22/22  Yes Dorothe Gaster, NP  glipiZIDE  (GLUCOTROL  XL) 10 MG 24 hr tablet TAKE ONE TABLET BY MOUTH AT NOON 07/24/22  Yes Dorothe Gaster, NP  losartan  (COZAAR ) 25 MG tablet Take 1 tablet (25 mg total) by mouth daily. 10/08/23  Yes Tammie Fall, MD  metoprolol  succinate (TOPROL  XL) 25 MG 24 hr tablet Take 1 tablet (25 mg total) by mouth daily.  10/08/23  Yes Tammie Fall, MD  Rivaroxaban  (XARELTO ) 15 MG TABS tablet Take 1 tablet (15 mg total) by mouth daily with supper. 10/09/23  Yes Tammie Fall, MD  Semaglutide (RYBELSUS) 3 MG TABS Take 3 mg by mouth daily. Via [employer]   Yes La Plant, Hoy Mackintosh, NP  tamsulosin  (FLOMAX ) 0.4 MG CAPS capsule TAKE ONE CAPSULE BY MOUTH AT NOON 10/18/22  Yes Dorothe Gaster, NP  Accu-Chek Softclix Lancets lancets USE AS DIRECTED  TO TEST BLOOD SUGAR ONE TIME DAILY  AND AS NEEDED 12/31/19   Steven Elam, FNP  Alcohol Swabs (B-D SINGLE USE SWABS REGULAR) PADS Use as instructed to clean area for glucose monitoring once daily and as needed.  Diagnosis:  E11.21  Non insulin -dependent 07/24/16   Donnie Galea, MD  mirtazapine (REMERON) 15 MG tablet SMARTSIG:0.5 Tablet(s) By Mouth Every Evening Patient not taking: Reported on 11/22/2023 11/18/23   [provider]  QUEtiapine (SEROQUEL) 25 MG tablet Take 12.5 mg by mouth daily. Patient not taking: Reported on 11/22/2023 11/11/23   [provider]      Allergies    Patient has no known allergies.    Review of Systems   Review of Systems  Physical Exam Updated Vital Signs BP 98/68 Comment: Left arm.  Temp (!) 97.4 F (36.3 C)   Ht  5\' 9"  (1.753 m)   Wt 81.6 kg   BMI 26.58 kg/m  Physical Exam Vitals and nursing note reviewed.  Constitutional:      General: He is not in acute distress.    Appearance: He is well-developed.  HENT:     Head: Normocephalic and atraumatic.  Eyes:     Conjunctiva/sclera: Conjunctivae normal.  Neck:     Comments: Cervical collar in place, no obvious abnormality Cardiovascular:     Rate and Rhythm: Normal rate and regular rhythm.  Pulmonary:     Effort: Pulmonary effort is normal. No respiratory distress.     Breath sounds: No stridor.  Abdominal:     General: There is no distension.  Skin:    General: Skin is warm and dry.       Neurological:     Mental Status: He is alert.     Comments:  Patient moves all extremity spontaneously to command, face is symmetric, speech is clear.  Psychiatric:        Cognition and Memory: Memory is impaired.     ED Results / Procedures / Treatments   Labs (all labs ordered are listed, but only abnormal results are displayed) Labs Reviewed  COMPREHENSIVE METABOLIC PANEL WITH GFR - Abnormal; Notable for the following components:      Result Value   Glucose, Bld 124 (*)    BUN 27 (*)    Creatinine, Ser 1.71 (*)    Total Protein 6.0 (*)    Albumin  3.3 (*)    GFR, Estimated 40 (*)    All other components within normal limits  CBC - Abnormal; Notable for the following components:   RBC 3.99 (*)    Hemoglobin 11.7 (*)    HCT 36.8 (*)    RDW 16.2 (*)    Platelets 121 (*)    All other components within normal limits  PROTIME-INR - Abnormal; Notable for the following components:   Prothrombin Time 22.4 (*)    INR 1.9 (*)    All other components within normal limits  I-STAT CHEM 8, ED - Abnormal; Notable for the following components:   BUN 29 (*)    Creatinine, Ser 1.90 (*)    Glucose, Bld 122 (*)    Calcium , Ion 1.10 (*)    Hemoglobin 12.6 (*)    HCT 37.0 (*)    All other components within normal limits  ETHANOL    EKG None  Radiology DG Pelvis Portable Result Date: 11/22/2023 CLINICAL DATA:  78 year old male status post fall on Xarelto . EXAM: PORTABLE PELVIS 1-2 VIEWS COMPARISON:  Intraoperative images left hip 09/17/2012. FINDINGS: 2 AP views of the pelvis 1312 hours. Left side chronic total hip arthroplasty. Hardware appears stable compared to 2014. Left hand artifact over the left hip on image #2. No acute pelvis fracture identified. Right femoral head appears normally located. Grossly intact proximal right femur. Lower lumbar spine posterior fusion hardware. Nonobstructed bowel-gas pattern. Iliofemoral calcified atherosclerosis. IMPRESSION: No acute fracture or dislocation identified about the pelvis. Chronic left total hip  arthroplasty. Electronically Signed   By: Marlise Simpers M.D.   On: 11/22/2023 14:22   DG Chest Port 1 View Result Date: 11/22/2023 CLINICAL DATA:  78 year old male status post fall on Xarelto . EXAM: PORTABLE CHEST 1 VIEW COMPARISON:  Cervical spine CT today.  Chest radiographs 04/24/2018. FINDINGS: AP view at 1311 hours. Veiling opacity at both lung bases, right lung apex pleural fluid confirmed on cervical CT today. Chronic left chest cardiac  AICD. Chronic CABG. Stable cardiac size and mediastinal contours. Calcified aortic atherosclerosis. Visualized tracheal air column is within normal limits. No pneumothorax. No air bronchograms. Symmetric increased pulmonary vascularity compared to 2019. bulky degenerative spurring at the right [facility] joint. No acute osseous abnormality identified. IMPRESSION: 1. Evidence of small pleural effusions and increased pulmonary vascular congestion compared to 2019. Consider mild or developing interstitial edema. 2. No acute traumatic injury identified. 3.  Aortic Atherosclerosis (ICD10-I70.0). Electronically Signed   By: Marlise Simpers M.D.   On: 11/22/2023 14:20   DG Elbow Complete Right Result Date: 11/22/2023 CLINICAL DATA:  78 year old male status post fall on Xarelto . EXAM: RIGHT ELBOW - COMPLETE 3+ VIEW COMPARISON:  Right forearm series 07/22/2008. FINDINGS: Two views at 1313 hours. Maintained alignment with degenerative loss of joint spaces at the right elbow. No joint effusion suspected. Mild degenerative spurring. Small chronic ossific fragments stable since 2009. No acute osseous abnormality identified. No discrete soft tissue injury. IMPRESSION: No acute fracture or dislocation identified about the right elbow. Electronically Signed   By: Marlise Simpers M.D.   On: 11/22/2023 14:18   CT CERVICAL SPINE WO CONTRAST Result Date: 11/22/2023 CLINICAL DATA:  78 year old male status post fall on Xarelto . EXAM: CT CERVICAL SPINE WITHOUT CONTRAST TECHNIQUE: Multidetector CT imaging of the cervical  spine was performed without intravenous contrast. Multiplanar CT image reconstructions were also generated. RADIATION DOSE REDUCTION: This exam was performed according to the departmental dose-optimization program which includes automated exposure control, adjustment of the mA and/or kV according to patient size and/or use of iterative reconstruction technique. COMPARISON:  Head CT today. FINDINGS: Alignment: Reversal of the normal cervical lordosis. Cervicothoracic junction alignment is within normal limits. Bilateral posterior element alignment is within normal limits. Skull base and vertebrae: Background bone mineralization is within normal limits for age. Visualized skull base is intact. No atlanto-occipital dissociation. C1 and C2 are severely degenerated on the left side, at the anterior a Don toy articulation. But appear intact and aligned. Widespread cervical ankylosis/arthrodesis detailed below. No acute osseous abnormality identified. Soft tissues and spinal canal: No prevertebral fluid or swelling. No visible canal hematoma. Calcified cervical carotid atherosclerosis. Disc levels: Previous C4 through C6 surgical ACDF. Evidence of solid arthrodesis at those levels, no hardware loosening. And superimposed degenerative appearing ankylosis from C2 through C4, at C6-C7, and developing at the cervicothoracic junction also. Severe asymmetric left side degeneration at the unfused C1-C2 level including prominent subchondral cyst of the left odontoid. Bulky ligamentous hypertrophy about the odontoid and subsequent mild cervicomedullary junction stenosis. Upper chest: Partially visible sternotomy, and asymmetric bulky degenerative soft tissue hypertrophy about the sternoclavicular joints greater on the right. Grossly intact visible upper thoracic levels. Left chest cardiac pacemaker. Layering low-density pleural effusion the right lung apex. Negative left lung apex. IMPRESSION: 1. No acute traumatic injury  identified in the cervical spine. 2. Severe C1-C2 degeneration superimposed on combined degenerative and postoperative cervical ankylosis C2 through the cervicothoracic junction. Associated multifactorial cervicomedullary junction stenosis, probably mild. 3. Layering pleural effusion the right lung apex appears to be transudate. Electronically Signed   By: Marlise Simpers M.D.   On: 11/22/2023 14:17   CT HEAD WO CONTRAST Result Date: 11/22/2023 CLINICAL DATA:  78 year old male status post fall on Xarelto . EXAM: CT HEAD WITHOUT CONTRAST TECHNIQUE: Contiguous axial images were obtained from the base of the skull through the vertex without intravenous contrast. RADIATION DOSE REDUCTION: This exam was performed according to the departmental dose-optimization program which includes automated exposure control,  adjustment of the mA and/or kV according to patient size and/or use of iterative reconstruction technique. COMPARISON:  None Available. FINDINGS: Brain: No midline shift, ventriculomegaly, mass effect, evidence of mass lesion, intracranial hemorrhage or evidence of cortically based acute infarction. Scattered mild to moderate periventricular white matter hypodensity in both cerebral hemispheres. Otherwise maintained gray-white differentiation. Vascular: Calcified atherosclerosis at the skull base. No suspicious intracranial vascular hyperdensity. Skull: Intact.  No acute osseous abnormality identified. Sinuses/Orbits: Mild inflammatory appearing paranasal sinus mucosal thickening and opacification. Tympanic cavities and mastoids well aerated. Other: No acute orbit or scalp soft tissue injury identified. IMPRESSION: 1. No acute intracranial abnormality or acute traumatic injury identified. 2. Mild to moderate white matter changes most commonly due to chronic small vessel disease. Electronically Signed   By: Marlise Simpers M.D.   On: 11/22/2023 14:11    Procedures Procedures    Medications Ordered in ED Medications - No  data to display  ED Course/ Medical Decision Making/ A&P                                 Medical Decision Making Elderly male on Xarelto  presents after mechanical fall with head trauma, neck pain, head pain, elbow pain.  Patient's cognition is seemingly at baseline, he is moving his extremities spontaneously, has no focal neurodeficits, concern for fracture bleed soft tissue injury.  Amount and/or Complexity of Data Reviewed Independent Historian: EMS External Data Reviewed: notes. Labs: ordered. Decision-making details documented in ED Course. Radiology: ordered and independent interpretation performed. Decision-making details documented in ED Course.  Risk Decision regarding hospitalization. Diagnosis or treatment significantly limited by social determinants of health.   2:50 PM Patient awake, alert, no complaints.  I discussed the patient's pacemaker interrogation with the rep at bedside, small adjustments made given the patient's history of A-fib, otherwise functional device.  Patient's imaging studies all reassuring, no evidence for fracture, no intracranial hemorrhage, labs reassuring as well patient comfortable with discharge, patient discharged in stable condition.        Final Clinical Impression(s) / ED Diagnoses Final diagnoses:  Fall, initial encounter    Rx / DC Orders ED Discharge Orders     None         Dorenda Gandy, MD 11/22/23 1450

## 2023-11-22 NOTE — Discharge Instructions (Signed)
 As discussed, your evaluation today has been largely reassuring.  But, it is important that you monitor your condition carefully, and do not hesitate to return to the ED if you develop new, or concerning changes in your condition. ? ?Otherwise, please follow-up with your physician for appropriate ongoing care. ? ?

## 2023-11-22 NOTE — ED Notes (Signed)
 Report called to a hazel foremar RN. ETA was given as no later then 1615 for transport to arrive, status changed to reflect.

## 2023-11-22 NOTE — Progress Notes (Signed)
 Orthopedic Tech Progress Note Patient Details:  Randy [name] October 20, 1945 [id number]  Patient ID: Randy [name], male   DOB: 1945-08-25, 78 y.o.   MRN: [id number] Level 2 trauma. FOT. [name] 11/22/2023, 1:14 PM

## 2023-11-22 NOTE — ED Notes (Signed)
 Patient transported to CT with this TRN

## 2023-11-22 NOTE — ED Triage Notes (Addendum)
 According to [facility]: Pt found [facility] and [facility]nas on site with pt. Staff yelled for pt to come to breakfast while walking he slipped and fell. Pt denies losing consciousness but is a unreliable narrator due to hx of dementia. Pt has HX of blood thinner and hit his head. Pt has 2 laceration on his R elbow. Pt found to be hypotensive despite a hx of htn, staff gave him bp medicine any way.  Ems found elbow to be swollen.EMS placed C collar on pt and arrived to ED with it on.  Fire team found vitals  Bp 98/68.  Ems vitals: Bp 104/60 HR 96 RR 16 Spo2 99%

## 2023-11-22 NOTE — ED Notes (Signed)
 Pt is an unreliable narrator do to dementia unable to answer triage questions.

## 2023-11-22 NOTE — ED Notes (Signed)
 Pt left in care of [facility] for transport back to group home. Discharge instructions given and reviewed with caretaker. Pt is in no new onset distress at time.

## 2023-11-22 NOTE — ED Notes (Signed)
 Trauma Response Nurse Documentation   Randy [name] is a 78 y.o. male arriving to [facility] via [facility]  On Eliquis  (apixaban ) daily. Trauma was activated as a Level 2 by Dr. Kassie Pais RN based on the following trauma criteria Elderly patients > 65 with head trauma on anti-coagulation (excluding ASA).  Patient cleared for CT by Dr. Liam Redhead. Pt transported to CT with trauma response nurse present to monitor. RN remained with the patient throughout their absence from the department for clinical observation.   GCS 15 -- does have some confusion-- baseline.    History   Past Medical History:  Diagnosis Date   AICD (automatic cardioverter/defibrillator) present 10/18/2016   "replaced 2009-[employer] device, Dr Carolynne Citron"   Anemia    Arthritis    "back" (08/27/2017)   CAD (coronary artery disease)    CHF (congestive heart failure) (HCC)    CKD (chronic kidney disease) stage 3, GFR 30-59 ml/min (HCC)    [name]   Diverticulosis    Dyslipidemia    GERD (gastroesophageal reflux disease)    Hypertension    Neuromuscular disorder (HCC)    neuropathy- in hands   Osteoarthritis    hips, hands   Pacemaker    Shortness of breath    Sleep apnea    started a sleep study but didn't completed, states PCP told him he has sleep  apnea, doesn't use CPAP   Systolic dysfunction    Type 2 diabetes with nephropathy (HCC)    Vitamin D  deficiency      Past Surgical History:  Procedure Laterality Date   ANTERIOR CERVICAL DECOMP/DISCECTOMY FUSION     BACK SURGERY     CARDIOVERSION N/A 10/29/2023   Procedure: CARDIOVERSION;  Surgeon: Bridgette Campus, MD;  Location: [facility] INVASIVE CV LAB;  Service: Cardiovascular;  Laterality: N/A;   CARPAL TUNNEL RELEASE Left    CORONARY ARTERY BYPASS GRAFT N/A 09/02/2017   Procedure: CORONARY ARTERY BYPASS GRAFTING (CABG) x three , using left internal mammary artery and right leg greater saphenous vein harvested endoscopically;  Surgeon: Bartley Lightning,  MD;  Location: [facility] OR;  Service: Open Heart Surgery;  Laterality: N/A;   CYST EXCISION     "back"   ICD IMPLANT N/A 10/18/2016   Procedure: ICD Implant- Upgrade to Dual Chamber;  Surgeon: Tammie Fall, MD;  Location: [facility] INVASIVE CV LAB;  Service: Cardiovascular;  Laterality: N/A;   INSERT / REPLACE / REMOVE PACEMAKER     JOINT REPLACEMENT     LEFT HEART CATH AND CORONARY ANGIOGRAPHY N/A 08/30/2017   Procedure: LEFT HEART CATH AND CORONARY ANGIOGRAPHY;  Surgeon: Swaziland, Peter M, MD;  Location: [facility] INVASIVE CV LAB;  Service: Cardiovascular;  Laterality: N/A;   LUMBAR LAMINECTOMY Left 07/17/2013   Procedure: MICRODISCECTOMY LUMBAR LAMINECTOMY;  Surgeon: Adah Acron, MD;  Location: [facility] OR;  Service: Orthopedics;  Laterality: Left;  Left L4 Laminotomy, Lateral Recess Decompression, Cyst Excision   SHOULDER OPEN ROTATOR CUFF REPAIR Left    TEE WITHOUT CARDIOVERSION N/A 09/02/2017   Procedure: TRANSESOPHAGEAL ECHOCARDIOGRAM (TEE);  Surgeon: Bartley Lightning, MD;  Location: [facility] OR;  Service: Open Heart Surgery;  Laterality: N/A;   TOTAL HIP ARTHROPLASTY Left 09/17/2012   Procedure: Left TOTAL HIP ARTHROPLASTY ANTERIOR APPROACH;  Surgeon: Adah Acron, MD;  Location: [facility] OR;  Service: Orthopedics;  Laterality: Left;       Initial Focused Assessment (If applicable, or please see trauma documentation): Airway - Clear Breathing - unlabored Circ -  small abrasion to right side of head GCS - 15- normally has some confusion - was unaware of the time and date  CT's Completed:   CT Head and CT C-Spine   Interventions:  Labs Xrays CT scans  Plan for disposition:  Discharge home    Randy [name]  Trauma Response RN  Please call TRN at [phone] for further assistance.

## 2023-11-23 ENCOUNTER — Emergency Department (HOSPITAL_COMMUNITY)
Admission: EM | Admit: 2023-11-23 | Discharge: 2023-11-24 | Disposition: A | Discharge status: Home / Self Care | Attending: Emergency Medicine | Admitting: Emergency Medicine

## 2023-11-23 ENCOUNTER — Emergency Department (HOSPITAL_COMMUNITY)

## 2023-11-23 ENCOUNTER — Other Ambulatory Visit: Payer: Self-pay

## 2023-11-23 ENCOUNTER — Encounter (HOSPITAL_COMMUNITY): Payer: Self-pay

## 2023-11-23 DIAGNOSIS — R42 Dizziness and giddiness: Secondary | ICD-10-CM | POA: Diagnosis not present

## 2023-11-23 DIAGNOSIS — W19XXXA Unspecified fall, initial encounter: Secondary | ICD-10-CM | POA: Diagnosis not present

## 2023-11-23 DIAGNOSIS — I129 Hypertensive chronic kidney disease with stage 1 through stage 4 chronic kidney disease, or unspecified chronic kidney disease: Secondary | ICD-10-CM | POA: Insufficient documentation

## 2023-11-23 DIAGNOSIS — W01198A Fall on same level from slipping, tripping and stumbling with subsequent striking against other object, initial encounter: Secondary | ICD-10-CM | POA: Insufficient documentation

## 2023-11-23 DIAGNOSIS — S0990XA Unspecified injury of head, initial encounter: Secondary | ICD-10-CM | POA: Insufficient documentation

## 2023-11-23 DIAGNOSIS — Z950 Presence of cardiac pacemaker: Secondary | ICD-10-CM | POA: Insufficient documentation

## 2023-11-23 DIAGNOSIS — Z79899 Other long term (current) drug therapy: Secondary | ICD-10-CM | POA: Diagnosis not present

## 2023-11-23 DIAGNOSIS — J90 Pleural effusion, not elsewhere classified: Secondary | ICD-10-CM | POA: Diagnosis not present

## 2023-11-23 DIAGNOSIS — M47812 Spondylosis without myelopathy or radiculopathy, cervical region: Secondary | ICD-10-CM | POA: Diagnosis not present

## 2023-11-23 DIAGNOSIS — I6782 Cerebral ischemia: Secondary | ICD-10-CM | POA: Diagnosis not present

## 2023-11-23 DIAGNOSIS — S59902A Unspecified injury of left elbow, initial encounter: Secondary | ICD-10-CM | POA: Diagnosis present

## 2023-11-23 DIAGNOSIS — I700 Atherosclerosis of aorta: Secondary | ICD-10-CM | POA: Diagnosis not present

## 2023-11-23 DIAGNOSIS — Z981 Arthrodesis status: Secondary | ICD-10-CM | POA: Diagnosis not present

## 2023-11-23 DIAGNOSIS — S52125A Nondisplaced fracture of head of left radius, initial encounter for closed fracture: Secondary | ICD-10-CM | POA: Insufficient documentation

## 2023-11-23 DIAGNOSIS — N189 Chronic kidney disease, unspecified: Secondary | ICD-10-CM | POA: Insufficient documentation

## 2023-11-23 DIAGNOSIS — I2510 Atherosclerotic heart disease of native coronary artery without angina pectoris: Secondary | ICD-10-CM | POA: Insufficient documentation

## 2023-11-23 DIAGNOSIS — Z7901 Long term (current) use of anticoagulants: Secondary | ICD-10-CM | POA: Diagnosis not present

## 2023-11-23 DIAGNOSIS — E119 Type 2 diabetes mellitus without complications: Secondary | ICD-10-CM | POA: Diagnosis not present

## 2023-11-23 DIAGNOSIS — I959 Hypotension, unspecified: Secondary | ICD-10-CM | POA: Diagnosis not present

## 2023-11-23 DIAGNOSIS — Z043 Encounter for examination and observation following other accident: Secondary | ICD-10-CM | POA: Diagnosis not present

## 2023-11-23 DIAGNOSIS — R0902 Hypoxemia: Secondary | ICD-10-CM | POA: Diagnosis not present

## 2023-11-23 DIAGNOSIS — Z7984 Long term (current) use of oral hypoglycemic drugs: Secondary | ICD-10-CM | POA: Diagnosis not present

## 2023-11-23 DIAGNOSIS — S199XXA Unspecified injury of neck, initial encounter: Secondary | ICD-10-CM | POA: Diagnosis not present

## 2023-11-23 DIAGNOSIS — M19022 Primary osteoarthritis, left elbow: Secondary | ICD-10-CM | POA: Diagnosis not present

## 2023-11-23 DIAGNOSIS — I10 Essential (primary) hypertension: Secondary | ICD-10-CM | POA: Diagnosis not present

## 2023-11-23 LAB — CBC WITH DIFFERENTIAL/PLATELET
Abs Immature Granulocytes: 0.02 10*3/uL (ref 0.00–0.07)
Basophils Absolute: 0 10*3/uL (ref 0.0–0.1)
Basophils Relative: 1 %
Eosinophils Absolute: 0.1 10*3/uL (ref 0.0–0.5)
Eosinophils Relative: 1 %
HCT: 32.6 % — ABNORMAL LOW (ref 39.0–52.0)
Hemoglobin: 10.3 g/dL — ABNORMAL LOW (ref 13.0–17.0)
Immature Granulocytes: 0 %
Lymphocytes Relative: 15 %
Lymphs Abs: 1 10*3/uL (ref 0.7–4.0)
MCH: 29.1 pg (ref 26.0–34.0)
MCHC: 31.6 g/dL (ref 30.0–36.0)
MCV: 92.1 fL (ref 80.0–100.0)
Monocytes Absolute: 0.7 10*3/uL (ref 0.1–1.0)
Monocytes Relative: 9 %
Neutro Abs: 5.3 10*3/uL (ref 1.7–7.7)
Neutrophils Relative %: 74 %
Platelets: 130 10*3/uL — ABNORMAL LOW (ref 150–400)
RBC: 3.54 MIL/uL — ABNORMAL LOW (ref 4.22–5.81)
RDW: 15.9 % — ABNORMAL HIGH (ref 11.5–15.5)
WBC: 7.1 10*3/uL (ref 4.0–10.5)
nRBC: 0 % (ref 0.0–0.2)

## 2023-11-23 LAB — COMPREHENSIVE METABOLIC PANEL WITH GFR
ALT: 10 U/L (ref 0–44)
AST: 20 U/L (ref 15–41)
Albumin: 3.3 g/dL — ABNORMAL LOW (ref 3.5–5.0)
Alkaline Phosphatase: 49 U/L (ref 38–126)
Anion gap: 13 (ref 5–15)
BUN: 32 mg/dL — ABNORMAL HIGH (ref 8–23)
CO2: 23 mmol/L (ref 22–32)
Calcium: 8.4 mg/dL — ABNORMAL LOW (ref 8.9–10.3)
Chloride: 106 mmol/L (ref 98–111)
Creatinine, Ser: 1.88 mg/dL — ABNORMAL HIGH (ref 0.61–1.24)
GFR, Estimated: 36 mL/min — ABNORMAL LOW (ref >60)
Glucose, Bld: 121 mg/dL — ABNORMAL HIGH (ref 70–99)
Potassium: 4.2 mmol/L (ref 3.5–5.1)
Sodium: 142 mmol/L (ref 135–145)
Total Bilirubin: 0.8 mg/dL (ref 0.0–1.2)
Total Protein: 5.6 g/dL — ABNORMAL LOW (ref 6.5–8.1)

## 2023-11-23 LAB — I-STAT CHEM 8, ED
BUN: 31 mg/dL — ABNORMAL HIGH (ref 8–23)
Calcium, Ion: 1.05 mmol/L — ABNORMAL LOW (ref 1.15–1.40)
Chloride: 106 mmol/L (ref 98–111)
Creatinine, Ser: 2 mg/dL — ABNORMAL HIGH (ref 0.61–1.24)
Glucose, Bld: 121 mg/dL — ABNORMAL HIGH (ref 70–99)
HCT: 31 % — ABNORMAL LOW (ref 39.0–52.0)
Hemoglobin: 10.5 g/dL — ABNORMAL LOW (ref 13.0–17.0)
Potassium: 4.1 mmol/L (ref 3.5–5.1)
Sodium: 142 mmol/L (ref 135–145)
TCO2: 23 mmol/L (ref 22–32)

## 2023-11-23 LAB — APTT: aPTT: 32 s (ref 24–36)

## 2023-11-23 LAB — TYPE AND SCREEN
ABO/RH(D): O NEG
Antibody Screen: NEGATIVE

## 2023-11-23 LAB — I-STAT CG4 LACTIC ACID, ED: Lactic Acid, Venous: 1.7 mmol/L (ref 0.5–1.9)

## 2023-11-23 NOTE — ED Provider Notes (Signed)
 [place] [facility] DEPARTMENT AT [facility] Provider Note   CSN: [id number] Arrival date & time: 11/23/23  2154     History {Add pertinent medical, surgical, social history, OB history to HPI:1} Chief Complaint  Patient presents with   Randy [name] is a 78 y.o. male.  78 year old male presents via EMS from group home, level 2 trauma for fall on Xarelto . Patient states he stumbled backwards and hit his left elbow on the dresser tonight. Unsure if he hit his head. No LOC.  Past medical history significant for dyslipidemia, CAD, systolic dysfunction, a fib (cardioversion 10/29/23), vitamin D  deficiency, pacemaker, GERD, neuropathy in the hands, anemia, hypertension, CKD, type 2 diabetes.       Home Medications Prior to Admission medications   Medication Sig Start Date End Date Taking? Authorizing Provider  Accu-Chek Softclix Lancets lancets USE AS DIRECTED  TO TEST BLOOD SUGAR ONE TIME DAILY  AND AS NEEDED 12/31/19   Steven Elam, FNP  Alcohol Swabs (B-D SINGLE USE SWABS REGULAR) PADS Use as instructed to clean area for glucose monitoring once daily and as needed.  Diagnosis:  E11.21  Non insulin -dependent 07/24/16   Donnie Galea, MD  allopurinol  (ZYLOPRIM ) 100 MG tablet TAKE 1/2 TABLET BY MOUTH AT NOON 09/19/22   Dorothe Gaster, NP  amiodarone  (PACERONE ) 200 MG tablet Take 1 tablet (200 mg total) by mouth daily. 10/08/23   Tammie Fall, MD  atorvastatin  (LIPITOR ) 80 MG tablet TAKE ONE TABLET BY MOUTH AT [place] 10/08/23   Tammie Fall, MD  donepezil  (ARICEPT ) 5 MG tablet TAKE ONE TABLET BY MOUTH AT NOON 10/18/22   Dorothe Gaster, NP  ezetimibe  (ZETIA ) 10 MG tablet TAKE ONE TABLET BY MOUTH AT NOON 08/21/22   Dorothe Gaster, NP  finasteride  (PROSCAR ) 5 MG tablet TAKE ONE TABLET BY MOUTH AT NOON 10/18/22   Dorothe Gaster, NP  furosemide  (LASIX ) 20 MG tablet TAKE ONE TABLET BY MOUTH AT NOON 09/19/22   Dorothe Gaster, NP  gabapentin  (NEURONTIN ) 300 MG capsule  TAKE TWO CAPSULES BY MOUTH TWICE DAILY 03/22/22   Dorothe Gaster, NP  glipiZIDE  (GLUCOTROL  XL) 10 MG 24 hr tablet TAKE ONE TABLET BY MOUTH AT NOON 07/24/22   Dorothe Gaster, NP  losartan  (COZAAR ) 25 MG tablet Take 1 tablet (25 mg total) by mouth daily. 10/08/23   Tammie Fall, MD  metoprolol  succinate (TOPROL  XL) 25 MG 24 hr tablet Take 1 tablet (25 mg total) by mouth daily. 10/08/23   Tammie Fall, MD  mirtazapine (REMERON) 15 MG tablet SMARTSIG:0.5 Tablet(s) By Mouth Every Evening Patient not taking: Reported on 11/22/2023 11/18/23   [provider]  QUEtiapine (SEROQUEL) 25 MG tablet Take 12.5 mg by mouth daily. Patient not taking: Reported on 11/22/2023 11/11/23   [provider]  Rivaroxaban  (XARELTO ) 15 MG TABS tablet Take 1 tablet (15 mg total) by mouth daily with supper. 10/09/23   Tammie Fall, MD  Semaglutide (RYBELSUS) 3 MG TABS Take 3 mg by mouth daily. Via [employer] Cares    [name], Hoy Mackintosh, NP  tamsulosin  (FLOMAX ) 0.4 MG CAPS capsule TAKE ONE CAPSULE BY MOUTH AT NOON 10/18/22   Dorothe Gaster, NP      Allergies    Patient has no known allergies.    Review of Systems   Review of Systems Negative except as per HPI Physical Exam Updated Vital Signs BP (!) 116/58 (  BP Location: Right Arm)   Pulse 72   Temp 97.9 F (36.6 C) (Oral)   Resp 18   Ht 5\' 9"  (1.753 m)   Wt 80.7 kg   SpO2 100%   BMI 26.29 kg/m  Physical Exam Vitals and nursing note reviewed.  Constitutional:      General: He is not in acute distress.    Appearance: He is well-developed. He is not diaphoretic.  HENT:     Head: Normocephalic and atraumatic.  Eyes:     Extraocular Movements: Extraocular movements intact.     Pupils: Pupils are equal, round, and reactive to light.  Cardiovascular:     Pulses: Normal pulses.  Pulmonary:     Effort: Pulmonary effort is normal.  Musculoskeletal:        General: Signs of injury present. No swelling or tenderness. Normal range of motion.      Comments: Small skin tear left elbow, no pain with palpation or ROM.  Skin:    General: Skin is warm and dry.     Findings: No bruising, erythema or rash.  Neurological:     Mental Status: He is alert and oriented to person, place, and time.  Psychiatric:        Behavior: Behavior normal.     ED Results / Procedures / Treatments   Labs (all labs ordered are listed, but only abnormal results are displayed) Labs Reviewed  I-STAT CHEM 8, ED - Abnormal; Notable for the following components:      Result Value   BUN 31 (*)    Creatinine, Ser 2.00 (*)    Glucose, Bld 121 (*)    Calcium , Ion 1.05 (*)    Hemoglobin 10.5 (*)    HCT 31.0 (*)    All other components within normal limits  CBC WITH DIFFERENTIAL/PLATELET  APTT  COMPREHENSIVE METABOLIC PANEL WITH GFR  URINALYSIS, ROUTINE W REFLEX MICROSCOPIC  I-STAT CG4 LACTIC ACID, ED  TYPE AND SCREEN    EKG None  Radiology DG Pelvis Portable Result Date: 11/22/2023 CLINICAL DATA:  78 year old male status post fall on Xarelto . EXAM: PORTABLE PELVIS 1-2 VIEWS COMPARISON:  Intraoperative images left hip 09/17/2012. FINDINGS: 2 AP views of the pelvis 1312 hours. Left side chronic total hip arthroplasty. Hardware appears stable compared to 2014. Left hand artifact over the left hip on image #2. No acute pelvis fracture identified. Right femoral head appears normally located. Grossly intact proximal right femur. Lower lumbar spine posterior fusion hardware. Nonobstructed bowel-gas pattern. Iliofemoral calcified atherosclerosis. IMPRESSION: No acute fracture or dislocation identified about the pelvis. Chronic left total hip arthroplasty. Electronically Signed   By: Marlise Simpers M.D.   On: 11/22/2023 14:22   DG Chest Port 1 View Result Date: 11/22/2023 CLINICAL DATA:  78 year old male status post fall on Xarelto . EXAM: PORTABLE CHEST 1 VIEW COMPARISON:  Cervical spine CT today.  Chest radiographs 04/24/2018. FINDINGS: AP view at 1311 hours. Veiling  opacity at both lung bases, right lung apex pleural fluid confirmed on cervical CT today. Chronic left chest cardiac AICD. Chronic CABG. Stable cardiac size and mediastinal contours. Calcified aortic atherosclerosis. Visualized tracheal air column is within normal limits. No pneumothorax. No air bronchograms. Symmetric increased pulmonary vascularity compared to 2019. bulky degenerative spurring at the right [facility] joint. No acute osseous abnormality identified. IMPRESSION: 1. Evidence of small pleural effusions and increased pulmonary vascular congestion compared to 2019. Consider mild or developing interstitial edema. 2. No acute traumatic injury identified. 3.  Aortic Atherosclerosis (ICD10-I70.0).  Electronically Signed   By: Marlise Simpers M.D.   On: 11/22/2023 14:20   DG Elbow Complete Right Result Date: 11/22/2023 CLINICAL DATA:  78 year old male status post fall on Xarelto . EXAM: RIGHT ELBOW - COMPLETE 3+ VIEW COMPARISON:  Right forearm series 07/22/2008. FINDINGS: Two views at 1313 hours. Maintained alignment with degenerative loss of joint spaces at the right elbow. No joint effusion suspected. Mild degenerative spurring. Small chronic ossific fragments stable since 2009. No acute osseous abnormality identified. No discrete soft tissue injury. IMPRESSION: No acute fracture or dislocation identified about the right elbow. Electronically Signed   By: Marlise Simpers M.D.   On: 11/22/2023 14:18   CT CERVICAL SPINE WO CONTRAST Result Date: 11/22/2023 CLINICAL DATA:  78 year old male status post fall on Xarelto . EXAM: CT CERVICAL SPINE WITHOUT CONTRAST TECHNIQUE: Multidetector CT imaging of the cervical spine was performed without intravenous contrast. Multiplanar CT image reconstructions were also generated. RADIATION DOSE REDUCTION: This exam was performed according to the departmental dose-optimization program which includes automated exposure control, adjustment of the mA and/or kV according to patient size and/or use of  iterative reconstruction technique. COMPARISON:  Head CT today. FINDINGS: Alignment: Reversal of the normal cervical lordosis. Cervicothoracic junction alignment is within normal limits. Bilateral posterior element alignment is within normal limits. Skull base and vertebrae: Background bone mineralization is within normal limits for age. Visualized skull base is intact. No atlanto-occipital dissociation. C1 and C2 are severely degenerated on the left side, at the anterior a Don toy articulation. But appear intact and aligned. Widespread cervical ankylosis/arthrodesis detailed below. No acute osseous abnormality identified. Soft tissues and spinal canal: No prevertebral fluid or swelling. No visible canal hematoma. Calcified cervical carotid atherosclerosis. Disc levels: Previous C4 through C6 surgical ACDF. Evidence of solid arthrodesis at those levels, no hardware loosening. And superimposed degenerative appearing ankylosis from C2 through C4, at C6-C7, and developing at the cervicothoracic junction also. Severe asymmetric left side degeneration at the unfused C1-C2 level including prominent subchondral cyst of the left odontoid. Bulky ligamentous hypertrophy about the odontoid and subsequent mild cervicomedullary junction stenosis. Upper chest: Partially visible sternotomy, and asymmetric bulky degenerative soft tissue hypertrophy about the sternoclavicular joints greater on the right. Grossly intact visible upper thoracic levels. Left chest cardiac pacemaker. Layering low-density pleural effusion the right lung apex. Negative left lung apex. IMPRESSION: 1. No acute traumatic injury identified in the cervical spine. 2. Severe C1-C2 degeneration superimposed on combined degenerative and postoperative cervical ankylosis C2 through the cervicothoracic junction. Associated multifactorial cervicomedullary junction stenosis, probably mild. 3. Layering pleural effusion the right lung apex appears to be transudate.  Electronically Signed   By: Marlise Simpers M.D.   On: 11/22/2023 14:17   CT HEAD WO CONTRAST Result Date: 11/22/2023 CLINICAL DATA:  78 year old male status post fall on Xarelto . EXAM: CT HEAD WITHOUT CONTRAST TECHNIQUE: Contiguous axial images were obtained from the base of the skull through the vertex without intravenous contrast. RADIATION DOSE REDUCTION: This exam was performed according to the departmental dose-optimization program which includes automated exposure control, adjustment of the mA and/or kV according to patient size and/or use of iterative reconstruction technique. COMPARISON:  None Available. FINDINGS: Brain: No midline shift, ventriculomegaly, mass effect, evidence of mass lesion, intracranial hemorrhage or evidence of cortically based acute infarction. Scattered mild to moderate periventricular white matter hypodensity in both cerebral hemispheres. Otherwise maintained gray-white differentiation. Vascular: Calcified atherosclerosis at the skull base. No suspicious intracranial vascular hyperdensity. Skull: Intact.  No acute osseous abnormality identified.  Sinuses/Orbits: Mild inflammatory appearing paranasal sinus mucosal thickening and opacification. Tympanic cavities and mastoids well aerated. Other: No acute orbit or scalp soft tissue injury identified. IMPRESSION: 1. No acute intracranial abnormality or acute traumatic injury identified. 2. Mild to moderate white matter changes most commonly due to chronic small vessel disease. Electronically Signed   By: Marlise Simpers M.D.   On: 11/22/2023 14:11    Procedures Procedures  {Document cardiac monitor, telemetry assessment procedure when appropriate:1}  Medications Ordered in ED Medications - No data to display  ED Course/ Medical Decision Making/ A&P   {   Click here for ABCD2, HEART and other calculatorsREFRESH Note before signing :1}                              Medical Decision Making Amount and/or Complexity of Data Reviewed Labs:  ordered. Radiology: ordered.   This patient presents to the ED for concern of injuries from a fall, this involves an extensive number of treatment options, and is a complaint that carries with it a high risk of complications and morbidity.  The differential diagnosis includes ***   Co morbidities that complicate the patient evaluation  ***   Additional history obtained:  Additional history obtained from *** External records from outside source obtained and reviewed including cardioversion with cardiology dated 10/29/23   Lab Tests:  I Ordered, and personally interpreted labs.  The pertinent results include:  ***   Imaging Studies ordered:  I ordered imaging studies including ***  I independently visualized and interpreted imaging which showed *** I agree with the radiologist interpretation   Cardiac Monitoring: / EKG:  The patient was maintained on a cardiac monitor.  I personally viewed and interpreted the cardiac monitored which showed an underlying rhythm of: ***   Problem List / ED Course / Critical interventions / Medication management  *** I ordered medication including ***  for ***  Reevaluation of the patient after these medicines showed that the patient {resolved/improved/worsened:23923::"improved"} I have reviewed the patients home medicines and have made adjustments as needed   Consultations Obtained:  I requested consultation with the ***,  and discussed lab and imaging findings as well as pertinent plan - they recommend: ***   Social Determinants of Health:  ***   Test / Admission - Considered:  ***   {Document critical care time when appropriate:1} {Document review of labs and clinical decision tools ie heart score, Chads2Vasc2 etc:1}  {Document your independent review of radiology images, and any outside records:1} {Document your discussion with family members, caretakers, and with consultants:1} {Document social determinants of health  affecting pt's care:1} {Document your decision making why or why not admission, treatments were needed:1} Final Clinical Impression(s) / ED Diagnoses Final diagnoses:  None    Rx / DC Orders ED Discharge Orders     None

## 2023-11-23 NOTE — ED Triage Notes (Addendum)
 [facility] for a fall on thinners. Pt is not complaining of anything other than a skin tear on the left elbow and wrist. Unknown if he hit his head. Apparently fell backwards and caught his elbow on a dresser. Lives at a group home at [address] [place], [place]. [address]

## 2023-11-24 ENCOUNTER — Emergency Department (HOSPITAL_COMMUNITY)

## 2023-11-24 DIAGNOSIS — R531 Weakness: Secondary | ICD-10-CM | POA: Diagnosis not present

## 2023-11-24 DIAGNOSIS — J90 Pleural effusion, not elsewhere classified: Secondary | ICD-10-CM | POA: Diagnosis not present

## 2023-11-24 DIAGNOSIS — Z950 Presence of cardiac pacemaker: Secondary | ICD-10-CM | POA: Diagnosis not present

## 2023-11-24 DIAGNOSIS — I700 Atherosclerosis of aorta: Secondary | ICD-10-CM | POA: Diagnosis not present

## 2023-11-24 DIAGNOSIS — Z743 Need for continuous supervision: Secondary | ICD-10-CM | POA: Diagnosis not present

## 2023-11-24 DIAGNOSIS — R404 Transient alteration of awareness: Secondary | ICD-10-CM | POA: Diagnosis not present

## 2023-11-24 DIAGNOSIS — Z043 Encounter for examination and observation following other accident: Secondary | ICD-10-CM | POA: Diagnosis not present

## 2023-11-24 LAB — URINALYSIS, ROUTINE W REFLEX MICROSCOPIC
Bacteria, UA: NONE SEEN
Bilirubin Urine: NEGATIVE
Glucose, UA: NEGATIVE mg/dL
Hgb urine dipstick: NEGATIVE
Ketones, ur: NEGATIVE mg/dL
Leukocytes,Ua: NEGATIVE
Nitrite: NEGATIVE
Protein, ur: 100 mg/dL — AB
Specific Gravity, Urine: 1.015 (ref 1.005–1.030)
pH: 5 (ref 5.0–8.0)

## 2023-11-24 NOTE — ED Notes (Signed)
 Pt sitting on edge of bed removing leads and pulse ox. Pt redirected back into bed and Posey bed alarm placed. HOB adjusted. Pt denied any other needs at this time. Call light within reach. VSS.

## 2023-11-24 NOTE — ED Notes (Signed)
 [name], Martie Slaughter, advised someone would be at the home for [facility].

## 2023-11-24 NOTE — ED Notes (Signed)
 Pacemaker interrogation completed via Dietitian.

## 2023-11-24 NOTE — Discharge Instructions (Addendum)
 Left elbow radial head fracture. NOT splinted due to fall risk and no pain today. Can follow up with orthopedics if problematic for patient.   Recheck with primary care provider. Return to ER as needed.

## 2023-11-24 NOTE — ED Notes (Addendum)
 Per Rice Chamorro PA, c-collar may be removed. Pt informed of need for UA specimen, urinal provided and call light within reach for pt to notify staff when specimen ready.

## 2023-11-25 ENCOUNTER — Telehealth: Payer: Self-pay

## 2023-11-25 NOTE — Telephone Encounter (Signed)
 Alert received from [employer] for ICD Unscheduled remote transmission:  alert for AF burden. Normal device function.  Presenting AF, VP with occasional VS. AF burden 94%. Increase in AF is noted since February on trends. Known history of AF, on Xarelto  per Epic. Follow up as scheduled. Routing to triage for increase in AF per protocol.  DCCV completed 10/29/23. AF resumed 10/31/23.   Routing to [place], [place], [place] since last OV was 10/24/23.

## 2023-11-25 NOTE — Telephone Encounter (Signed)
Attempted to contact patient. No answer, left message to call back

## 2023-11-26 DIAGNOSIS — S52125A Nondisplaced fracture of head of left radius, initial encounter for closed fracture: Secondary | ICD-10-CM | POA: Diagnosis not present

## 2023-11-26 NOTE — Telephone Encounter (Signed)
 Spoke to patients son Martie Slaughter, advised AF findings of AF and Jonelle Neri recommends AF clinic referral. Martie Slaughter requested to call Olevia Bers for apts who assist with his care.   Hazel's number ~ [phone].  Routing to AF clinic to contact for apt.

## 2023-12-04 ENCOUNTER — Encounter (HOSPITAL_COMMUNITY): Payer: Self-pay

## 2023-12-04 ENCOUNTER — Ambulatory Visit (HOSPITAL_COMMUNITY): Admission: RE | Admit: 2023-12-04 | Source: Ambulatory Visit | Admitting: Internal Medicine

## 2023-12-10 ENCOUNTER — Ambulatory Visit (HOSPITAL_COMMUNITY)
Admission: RE | Admit: 2023-12-10 | Discharge: 2023-12-10 | Disposition: A | Discharge status: Home / Self Care | Source: Ambulatory Visit | Attending: Internal Medicine | Admitting: Internal Medicine

## 2023-12-10 VITALS — BP 106/70 | HR 76 | Ht 69.0 in | Wt 179.8 lb

## 2023-12-10 DIAGNOSIS — I4819 Other persistent atrial fibrillation: Secondary | ICD-10-CM | POA: Diagnosis not present

## 2023-12-10 DIAGNOSIS — D6869 Other thrombophilia: Secondary | ICD-10-CM

## 2023-12-10 DIAGNOSIS — I4891 Unspecified atrial fibrillation: Secondary | ICD-10-CM

## 2023-12-10 NOTE — Patient Instructions (Signed)
 STOP amiodarone    Follow up in 1 year

## 2023-12-10 NOTE — Progress Notes (Signed)
 Primary Care Physician: Randy Lord, FNP Primary Cardiologist: None Electrophysiologist: Randy Sells, MD     Referring Physician: Michaelle Adolphus, PA-C     Randy [name] is a 78 y.o. male with a history of ICM, chronic systolic CHF s/p ICD, VT, CAD (CABG 2019), CKD, HTN, HLD, DM, and atrial fibrillation who presents for consultation in the [facility]. Seen by Randy Adolphus, PA-C on 3/27 noted to be in Afib and s/p successful DCCV on 10/29/23. Device alert for ERAF post cardioversion. He is on amiodarone  200 mg daily. Patient is on Xarelto  15 mg daily for a CHADS2VASC score of 6.  On evaluation today, he is currently in Afib. He notes to not have cardiac awareness at this time. He has some fatigue but this is not new. No missed doses of Xarelto .   Today, he denies symptoms of palpitations, chest pain, shortness of breath, orthopnea, PND, lower extremity edema, dizziness, presyncope, syncope, snoring, daytime somnolence, bleeding, or neurologic sequela. The patient is tolerating medications without difficulties and is otherwise without complaint today.    he has a BMI of Body mass index is 26.55 kg/m.Randy [name] Filed Weights   12/10/23 0947  Weight: 81.6 kg    Current Outpatient Medications  Medication Sig Dispense Refill   Accu-Chek Softclix Lancets lancets USE AS DIRECTED  TO TEST BLOOD SUGAR ONE TIME DAILY  AND AS NEEDED 200 each 2   Alcohol Swabs (B-D SINGLE USE SWABS REGULAR) PADS Use as instructed to clean area for glucose monitoring once daily and as needed.  Diagnosis:  E11.21  Non insulin -dependent 100 each 3   allopurinol  (ZYLOPRIM ) 100 MG tablet TAKE 1/2 TABLET BY MOUTH AT NOON 15 tablet 2   atorvastatin  (LIPITOR ) 80 MG tablet TAKE ONE TABLET BY MOUTH AT NOON 90 tablet 1   donepezil  (ARICEPT ) 5 MG tablet TAKE ONE TABLET BY MOUTH AT NOON 90 tablet 1   ezetimibe  (ZETIA ) 10 MG tablet TAKE ONE TABLET BY MOUTH AT NOON 90 tablet 2   finasteride  (PROSCAR ) 5 MG  tablet TAKE ONE TABLET BY MOUTH AT NOON 30 tablet 5   furosemide  (LASIX ) 20 MG tablet TAKE ONE TABLET BY MOUTH AT NOON 30 tablet 2   gabapentin  (NEURONTIN ) 300 MG capsule TAKE TWO CAPSULES BY MOUTH TWICE DAILY 120 capsule 2   glipiZIDE  (GLUCOTROL  XL) 10 MG 24 hr tablet TAKE ONE TABLET BY MOUTH AT NOON 90 tablet 1   losartan  (COZAAR ) 25 MG tablet Take 1 tablet (25 mg total) by mouth daily. 90 tablet 1   metoprolol  succinate (TOPROL  XL) 25 MG 24 hr tablet Take 1 tablet (25 mg total) by mouth daily. 90 tablet 1   mirtazapine (REMERON) 15 MG tablet      QUEtiapine (SEROQUEL) 25 MG tablet Take 12.5 mg by mouth daily.     Rivaroxaban  (XARELTO ) 15 MG TABS tablet Take 1 tablet (15 mg total) by mouth daily with supper. 90 tablet 0   Semaglutide (RYBELSUS) 3 MG TABS Take 3 mg by mouth daily. Via [employer]     tamsulosin  (FLOMAX ) 0.4 MG CAPS capsule TAKE ONE CAPSULE BY MOUTH AT NOON 30 capsule 5   No current facility-administered medications for this encounter.    Atrial Fibrillation Management history:  Previous antiarrhythmic drugs: amiodarone  Previous cardioversions: 10/29/23 Previous ablations: none Anticoagulation history: Xarelto    ROS- All systems are reviewed and negative except as per the HPI above.  Physical Exam: BP 106/70   Pulse 76  Ht 5\' 9"  (1.753 m)   Wt 81.6 kg   BMI 26.55 kg/m   GEN: Well nourished, well developed in no acute distress NECK: No JVD; No carotid bruits CARDIAC: Irregularly irregular rate and rhythm, no murmurs, rubs, gallops RESPIRATORY:  Clear to auscultation without rales, wheezing or rhonchi  ABDOMEN: Soft, non-tender, non-distended EXTREMITIES:  No edema; No deformity   EKG today demonstrates  Vent. rate 76 BPM PR interval * ms QRS duration 124 ms QT/QTcB 446/501 ms P-R-T axes * 113 -71 Atrial fibrillation Septal infarct , age undetermined Lateral infarct , age undetermined ST & T wave abnormality, consider inferior ischemia Abnormal ECG When  compared with ECG of 23-Nov-2023 23:00, PREVIOUS ECG IS PRESENT  Echo 08/27/17 demonstrated  Study Conclusions   - Left ventricle: The cavity size was normal. Systolic function was    severely reduced. The estimated ejection fraction was in the    range of 25% to 30%. Diffuse hypokinesis. Features are consistent    with a pseudonormal left ventricular filling pattern, with    concomitant abnormal relaxation and increased filling pressure    (grade 2 diastolic dysfunction). Doppler parameters are    consistent with high ventricular filling pressure.  - Aortic valve: Transvalvular velocity was within the normal range.    There was no stenosis. There was no regurgitation.  - Mitral valve: Transvalvular velocity was within the normal range.    There was no evidence for stenosis. There was trivial    regurgitation.  - Left atrium: The atrium was mildly dilated.  - Right ventricle: The cavity size was normal. Wall thickness was    normal. Systolic function was normal.  - Tricuspid valve: There was trivial regurgitation.  - Pulmonary arteries: Systolic pressure was within the normal    range. PA peak pressure: 18 mm Hg (S).   ASSESSMENT & PLAN CHA2DS2-VASc Score = 6  The patient's score is based upon: CHF History: 1 HTN History: 1 Diabetes History: 1 Stroke History: 0 Vascular Disease History: 1 Age Score: 2 Gender Score: 0       ASSESSMENT AND PLAN: Persistent Atrial Fibrillation (ICD10:  I48.19) The patient's CHA2DS2-VASc score is 6, indicating a 9.7% annual risk of stroke.    He is currently in Afib. We discussed given ERAF whether to reload amiodarone  and attempt repeat cardioversion. We also discussed discontinuation of amiodarone  and proceed with rate control. After discussion, patient would like to discontinue amiodarone  and proceed with rate control. He declines repeat cardioversion. Stop amiodarone . Continue Toprol  25 mg daily.   Secondary Hypercoagulable State (ICD10:   D68.69) The patient is at significant risk for stroke/thromboembolism based upon his CHA2DS2-VASc Score of 6.  Continue Rivaroxaban  (Xarelto ).  Continue Xarelto  without interruption.     Follow up 1 year Afib clinic.    Minnie Amber, PA-C  Afib Clinic [facility] [address] [place], [place] [address] [phone]

## 2023-12-16 DIAGNOSIS — M25552 Pain in left hip: Secondary | ICD-10-CM | POA: Diagnosis not present

## 2023-12-16 DIAGNOSIS — M6281 Muscle weakness (generalized): Secondary | ICD-10-CM | POA: Diagnosis not present

## 2023-12-16 DIAGNOSIS — I10 Essential (primary) hypertension: Secondary | ICD-10-CM | POA: Diagnosis not present

## 2023-12-16 DIAGNOSIS — M25551 Pain in right hip: Secondary | ICD-10-CM | POA: Diagnosis not present

## 2023-12-16 DIAGNOSIS — R609 Edema, unspecified: Secondary | ICD-10-CM | POA: Diagnosis not present

## 2023-12-16 DIAGNOSIS — M545 Low back pain, unspecified: Secondary | ICD-10-CM | POA: Diagnosis not present

## 2023-12-16 DIAGNOSIS — E119 Type 2 diabetes mellitus without complications: Secondary | ICD-10-CM | POA: Diagnosis not present

## 2023-12-16 DIAGNOSIS — W1830XA Fall on same level, unspecified, initial encounter: Secondary | ICD-10-CM | POA: Diagnosis not present

## 2023-12-16 DIAGNOSIS — Z79899 Other long term (current) drug therapy: Secondary | ICD-10-CM | POA: Diagnosis not present

## 2023-12-26 DIAGNOSIS — R404 Transient alteration of awareness: Secondary | ICD-10-CM | POA: Diagnosis not present

## 2023-12-26 DIAGNOSIS — I499 Cardiac arrhythmia, unspecified: Secondary | ICD-10-CM | POA: Diagnosis not present
